# Patient Record
Sex: Female | Born: 1975
Health system: Southern US, Community
[De-identification: ages and names within clinical notes are randomized; demographics above are authoritative.]

## PROBLEM LIST (undated history)

## (undated) DIAGNOSIS — O24419 Gestational diabetes mellitus in pregnancy, unspecified control: Secondary | ICD-10-CM

## (undated) DIAGNOSIS — R0602 Shortness of breath: Secondary | ICD-10-CM

## (undated) DIAGNOSIS — Z923 Personal history of irradiation: Secondary | ICD-10-CM

## (undated) DIAGNOSIS — I10 Essential (primary) hypertension: Secondary | ICD-10-CM

## (undated) DIAGNOSIS — C7951 Secondary malignant neoplasm of bone: Secondary | ICD-10-CM

## (undated) DIAGNOSIS — K219 Gastro-esophageal reflux disease without esophagitis: Secondary | ICD-10-CM

## (undated) DIAGNOSIS — C50919 Malignant neoplasm of unspecified site of unspecified female breast: Secondary | ICD-10-CM

## (undated) DIAGNOSIS — F419 Anxiety disorder, unspecified: Secondary | ICD-10-CM

## (undated) DIAGNOSIS — C787 Secondary malignant neoplasm of liver and intrahepatic bile duct: Secondary | ICD-10-CM

## (undated) HISTORY — DX: Malignant neoplasm of unspecified site of unspecified female breast: C50.919

## (undated) HISTORY — PX: KELOID EXCISION: SHX1856

## (undated) HISTORY — DX: Anxiety disorder, unspecified: F41.9

## (undated) HISTORY — DX: Essential (primary) hypertension: I10

---

## 2003-05-14 ENCOUNTER — Emergency Department (HOSPITAL_COMMUNITY): Admission: EM | Admit: 2003-05-14 | Discharge: 2003-05-14 | Payer: Self-pay | Admitting: Emergency Medicine

## 2008-04-01 ENCOUNTER — Other Ambulatory Visit: Admission: RE | Admit: 2008-04-01 | Discharge: 2008-04-01 | Payer: Self-pay | Admitting: Obstetrics and Gynecology

## 2012-04-07 ENCOUNTER — Other Ambulatory Visit (HOSPITAL_COMMUNITY)
Admission: RE | Admit: 2012-04-07 | Discharge: 2012-04-07 | Disposition: A | Discharge status: Home / Self Care | Payer: 59 | Source: Ambulatory Visit | Attending: Obstetrics and Gynecology | Admitting: Obstetrics and Gynecology

## 2012-04-07 DIAGNOSIS — Z113 Encounter for screening for infections with a predominantly sexual mode of transmission: Secondary | ICD-10-CM | POA: Insufficient documentation

## 2012-04-07 DIAGNOSIS — Z1151 Encounter for screening for human papillomavirus (HPV): Secondary | ICD-10-CM | POA: Insufficient documentation

## 2012-04-07 DIAGNOSIS — Z01419 Encounter for gynecological examination (general) (routine) without abnormal findings: Secondary | ICD-10-CM | POA: Insufficient documentation

## 2012-08-25 ENCOUNTER — Encounter: Payer: Self-pay | Admitting: Adult Health

## 2014-01-06 ENCOUNTER — Other Ambulatory Visit: Payer: Self-pay | Admitting: Family

## 2014-01-06 DIAGNOSIS — N63 Unspecified lump in unspecified breast: Secondary | ICD-10-CM

## 2014-01-12 ENCOUNTER — Other Ambulatory Visit: Payer: Self-pay | Admitting: Family

## 2014-01-12 ENCOUNTER — Ambulatory Visit
Admission: RE | Admit: 2014-01-12 | Discharge: 2014-01-12 | Disposition: A | Discharge status: Home / Self Care | Payer: Medicaid Other | Source: Ambulatory Visit | Attending: Family | Admitting: Family

## 2014-01-12 ENCOUNTER — Ambulatory Visit
Admission: RE | Admit: 2014-01-12 | Discharge: 2014-01-12 | Disposition: A | Discharge status: Home / Self Care | Payer: BC Managed Care – PPO | Source: Ambulatory Visit | Attending: Family | Admitting: Family

## 2014-01-12 ENCOUNTER — Encounter (INDEPENDENT_AMBULATORY_CARE_PROVIDER_SITE_OTHER): Payer: Self-pay

## 2014-01-12 DIAGNOSIS — N63 Unspecified lump in unspecified breast: Secondary | ICD-10-CM

## 2014-01-13 ENCOUNTER — Other Ambulatory Visit: Payer: Self-pay | Admitting: Family

## 2014-01-13 DIAGNOSIS — C50911 Malignant neoplasm of unspecified site of right female breast: Secondary | ICD-10-CM

## 2014-01-15 ENCOUNTER — Telehealth: Payer: Self-pay | Admitting: *Deleted

## 2014-01-15 DIAGNOSIS — C50211 Malignant neoplasm of upper-inner quadrant of right female breast: Secondary | ICD-10-CM | POA: Insufficient documentation

## 2014-01-15 NOTE — Telephone Encounter (Signed)
Received call back from patient and confirmed patient appointment for [facility] 01/20/14 at 12N.

## 2014-01-15 NOTE — Telephone Encounter (Signed)
Left 2 vm with pt to schedule [facility]. Informed [facility]. Leigh informed me that pt was informed date, time and place.

## 2014-01-18 ENCOUNTER — Ambulatory Visit
Admission: RE | Admit: 2014-01-18 | Discharge: 2014-01-18 | Disposition: A | Discharge status: Home / Self Care | Payer: Medicaid Other | Source: Ambulatory Visit | Attending: Family | Admitting: Family

## 2014-01-18 ENCOUNTER — Other Ambulatory Visit: Payer: Self-pay | Admitting: Diagnostic Radiology

## 2014-01-18 DIAGNOSIS — C50911 Malignant neoplasm of unspecified site of right female breast: Secondary | ICD-10-CM

## 2014-01-18 MED ORDER — GADOBENATE DIMEGLUMINE 529 MG/ML IV SOLN
15.0000 mL | Freq: Once | INTRAVENOUS | Status: AC | PRN
  Administered 2014-01-18: 15 mL via INTRAVENOUS

## 2014-01-20 ENCOUNTER — Encounter: Payer: Self-pay | Admitting: Hematology and Oncology

## 2014-01-20 ENCOUNTER — Other Ambulatory Visit (HOSPITAL_BASED_OUTPATIENT_CLINIC_OR_DEPARTMENT_OTHER): Payer: Medicaid Other

## 2014-01-20 ENCOUNTER — Encounter: Payer: Self-pay | Admitting: Radiation Oncology

## 2014-01-20 ENCOUNTER — Ambulatory Visit: Payer: Medicaid Other

## 2014-01-20 ENCOUNTER — Encounter: Payer: Self-pay | Admitting: General Practice

## 2014-01-20 ENCOUNTER — Ambulatory Visit (INDEPENDENT_AMBULATORY_CARE_PROVIDER_SITE_OTHER): Payer: Self-pay | Admitting: General Surgery

## 2014-01-20 ENCOUNTER — Ambulatory Visit: Payer: Medicaid Other | Attending: General Surgery | Admitting: Physical Therapy

## 2014-01-20 ENCOUNTER — Ambulatory Visit (HOSPITAL_BASED_OUTPATIENT_CLINIC_OR_DEPARTMENT_OTHER): Payer: Medicaid Other | Admitting: Hematology and Oncology

## 2014-01-20 ENCOUNTER — Other Ambulatory Visit (INDEPENDENT_AMBULATORY_CARE_PROVIDER_SITE_OTHER): Payer: Self-pay | Admitting: General Surgery

## 2014-01-20 ENCOUNTER — Ambulatory Visit
Admission: RE | Admit: 2014-01-20 | Discharge: 2014-01-20 | Disposition: A | Discharge status: Home / Self Care | Payer: BC Managed Care – PPO | Source: Ambulatory Visit | Attending: Radiation Oncology | Admitting: Radiation Oncology

## 2014-01-20 VITALS — BP 151/69 | HR 61 | Temp 99.6°F | Resp 18 | Ht 67.0 in | Wt 169.2 lb

## 2014-01-20 DIAGNOSIS — C50919 Malignant neoplasm of unspecified site of unspecified female breast: Secondary | ICD-10-CM | POA: Insufficient documentation

## 2014-01-20 DIAGNOSIS — C773 Secondary and unspecified malignant neoplasm of axilla and upper limb lymph nodes: Secondary | ICD-10-CM

## 2014-01-20 DIAGNOSIS — I10 Essential (primary) hypertension: Secondary | ICD-10-CM | POA: Insufficient documentation

## 2014-01-20 DIAGNOSIS — C50211 Malignant neoplasm of upper-inner quadrant of right female breast: Secondary | ICD-10-CM

## 2014-01-20 DIAGNOSIS — IMO0001 Reserved for inherently not codable concepts without codable children: Secondary | ICD-10-CM | POA: Insufficient documentation

## 2014-01-20 DIAGNOSIS — R293 Abnormal posture: Secondary | ICD-10-CM | POA: Insufficient documentation

## 2014-01-20 DIAGNOSIS — Z170 Estrogen receptor positive status [ER+]: Secondary | ICD-10-CM

## 2014-01-20 HISTORY — DX: Malignant neoplasm of unspecified site of unspecified female breast: C50.919

## 2014-01-20 LAB — COMPREHENSIVE METABOLIC PANEL (CC13)
ALT: <6 U/L (ref 0–55)
ANION GAP: 5 meq/L (ref 3–11)
AST: 9 U/L (ref 5–34)
Albumin: 4 g/dL (ref 3.5–5.0)
Alkaline Phosphatase: 57 U/L (ref 40–150)
BILIRUBIN TOTAL: 0.43 mg/dL (ref 0.20–1.20)
BUN: 9.3 mg/dL (ref 7.0–26.0)
CO2: 26 meq/L (ref 22–29)
CREATININE: 0.8 mg/dL (ref 0.6–1.1)
Calcium: 9.8 mg/dL (ref 8.4–10.4)
Chloride: 108 mEq/L (ref 98–109)
Glucose: 101 mg/dl (ref 70–140)
Potassium: 3.8 mEq/L (ref 3.5–5.1)
Sodium: 139 mEq/L (ref 136–145)
Total Protein: 8.1 g/dL (ref 6.4–8.3)

## 2014-01-20 LAB — CBC WITH DIFFERENTIAL/PLATELET
BASO%: 0.8 % (ref 0.0–2.0)
Basophils Absolute: 0.1 10*3/uL (ref 0.0–0.1)
EOS ABS: 0.1 10*3/uL (ref 0.0–0.5)
EOS%: 1.2 % (ref 0.0–7.0)
HEMATOCRIT: 36.9 % (ref 34.8–46.6)
HGB: 11.7 g/dL (ref 11.6–15.9)
LYMPH%: 27.1 % (ref 14.0–49.7)
MCH: 27.7 pg (ref 25.1–34.0)
MCHC: 31.7 g/dL (ref 31.5–36.0)
MCV: 87.5 fL (ref 79.5–101.0)
MONO#: 0.6 10*3/uL (ref 0.1–0.9)
MONO%: 9.5 % (ref 0.0–14.0)
NEUT%: 61.4 % (ref 38.4–76.8)
NEUTROS ABS: 4.2 10*3/uL (ref 1.5–6.5)
PLATELETS: 234 10*3/uL (ref 145–400)
RBC: 4.22 10*6/uL (ref 3.70–5.45)
RDW: 12.7 % (ref 11.2–14.5)
WBC: 6.8 10*3/uL (ref 3.9–10.3)
lymph#: 1.8 10*3/uL (ref 0.9–3.3)

## 2014-01-20 NOTE — Progress Notes (Signed)
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 Breast cancer of upper-inner quadrant of right female breast 174.2 C50.211     HISTORY OF PRESENT ILLNESS::Debbie Martinez is a 38 y.o. female who presented with a  palpable right breast mass. Mammogram showed a suspicious 1m mass at 2:00 and separate area of calcifications at 12:00 (5x953m. Also a suspicious node in the axilla.  R Axillary node biopsy was positive for carcinoma, and both masses were biopsied in the breast showing grade II Invasive Ductal Carcinoma with DCIS, ER+ / PR+ / Her2 neg and a separate grade III Invasive Ductal Carcinoma with DCIS, ER+ / PR+ / Her2 neg.  MRI showed 2 breast masses corresponding with the areas on mammogram that had been biopsied (1326mt 12:00 and 49m6m the 2:00/UIQ region), and multiple suspicious axillary nodes and a 6mm 62metropectoral node. L side negative for signs of malignancy.  She has met with Dr GudenLindi Adieis interested in and planning to pursue neoadjuvant chemotherapy.  She has met with Dr Toth Marlou Starksshe is hoping to achieve breast conservation thereafter.  She denies hormonal replacement.  She has one daughter.  She lives in [place]. Win[place] is not currently working.  She has some post biopsy tenderness.  PREVIOUS RADIATION THERAPY: No  PAST MEDICAL HISTORY:  has a past medical history of Hypertension and Anxiety.    PAST SURGICAL HISTORY:History reviewed. No pertinent past surgical history.  FAMILY HISTORY: family history is not on file.  SOCIAL HISTORY:  reports that she has never smoked. She  does not have any smokeless tobacco history on file. She reports that she drinks alcohol. She reports that she does not use illicit drugs.  ALLERGIES: Lisinopril  MEDICATIONS:  Current Outpatient Prescriptions  Medication Sig Dispense Refill  . atenolol (TENORMIN) 50 MG tablet Take 50 mg by mouth daily.       No current facility-administered medications for this encounter.    REVIEW OF SYSTEMS:  Notable for that above.   PHYSICAL EXAM:    Vitals with Age-Percentiles 01/20/2014  Length 170.2395.3Systolic 151  202stolic 69  Pulse 61  Respiration 18  Weight 76.749 kg  BMI 26.6  VISIT REPORT    General: Alert and oriented, in no acute distress HEENT: Head is normocephalic. Pupils are equally round and reactive to light. Extraocular movements are intact. Oropharynx is clear. Neck: Neck is supple, no palpable cervical or supraclavicular lymphadenopathy. Heart: Regular in rate and rhythm with no murmurs, rubs, or gallops. Chest: Clear to auscultation bilaterally, with no rhonchi, wheezes, or rales. Abdomen: Soft, nontender, nondistended, with no rigidity or guarding. Extremities: No cyanosis or edema. Lymphatics: see HEENT Skin: No concerning lesions. Musculoskeletal: symmetric strength and muscle tone throughout. Neurologic: Cranial nerves II through XII are grossly intact. No obvious focalities. Speech is fluent. Coordination is intact. Psychiatric: Judgment and insight are intact. Affect is appropriate. Breasts: right breast notable for two lesions in the upper inner quadrant, ranging from 2.5 to 3.5 cm in dimension. I appreciate two nodes in the R axilla, approximately 1cm each. Left  side unremarkable.  ECOG = 0  0 - Asymptomatic (Fully active, able to carry on all predisease activities without restriction)  1 - Symptomatic but completely ambulatory (Restricted in physically strenuous activity but ambulatory and able to carry out work of a light or sedentary nature. For  example, light housework, office work)  2 - Symptomatic, <50% in bed during the day (Ambulatory and capable of all self care but unable to carry out any work activities. Up and about more than 50% of waking hours)  3 - Symptomatic, >50% in bed, but not bedbound (Capable of only limited self-care, confined to bed or chair 50% or more of waking hours)  4 - Bedbound (Completely disabled. Cannot carry on any self-care. Totally confined to bed or chair)  5 - Death   Eustace Pen MM, Creech RH, Tormey DC, et al. [id number]). "Toxicity and response criteria of the [facility]". [address] Oncol. 5 (6): 649-55   LABORATORY DATA:  Lab Results  Component Value Date   WBC 6.8 01/20/2014   CMP     Component Value Date/Time   NA 139 01/20/2014 1149         RADIOGRAPHY: Mr Breast Bilateral W Wo Contrast  01/18/2014   CLINICAL DATA:  Newly diagnosed invasive ductal carcinoma in a right upper inner quadrant breast mass, right upper breast calcifications, and metastatic right axillary lymph node.  LABS:  BUN and creatinine were obtained on site at [facility] at  [address]  Results:  BUN 7 mg/dL,  Creatinine 0.7 mg/dL.  EXAM: BILATERAL BREAST MRI WITH AND WITHOUT CONTRAST  TECHNIQUE: Multiplanar, multisequence MR images of both breasts were obtained prior to and following the intravenous administration of 34m of MultiHance.  THREE-DIMENSIONAL MR IMAGE RENDERING ON INDEPENDENT WORKSTATION:  Three-dimensional MR images were rendered by post-processing of the original MR data on an independent workstation. The three-dimensional MR images were interpreted, and findings are reported in the following complete MRI report for this study. Three dimensional images were evaluated at the independent DynaCad workstation  COMPARISON:  Previous exams  FINDINGS: Breast composition: c:  Heterogeneous fibroglandular tissue  Background parenchymal enhancement: Moderate  Right breast:  Clumped  nodular enhancement right breast 12 o'clock location with associated post biopsy change, measuring 1.3 x 0.8 x 0.5 cm, demonstrating a focus of central wash-in/washout enhancement kinetics. This corresponds to an area of biopsy proven invasive ductal carcinoma corresponding to mammographically evident calcifications.  Upper inner quadrant, 2.8 x 2.0 x 2.0 cm irregular mass with central clip artifact, wash-in/ washout enhancing kinetics, corresponding to the biopsy-proven mammographically and sonographically evident mass and biopsy-proven invasive ductal carcinoma.  Left breast: No mass or abnormal enhancement.  Lymph nodes: Multiple borderline enlarged right axillary lymph nodes with cortical thickening are noted, largest short axis diameter 1 cm. There is also a 0.6 cm short axis right retropectoral node.  Ancillary findings:  None.  IMPRESSION: Abnormal enhancement right breast upper inner quadrant and 12 o'clock location corresponding to biopsy-proven invasive ductal carcinoma in both locations, with multiple borderline enlarged abnormal appearing right axillary lymph nodes demonstrating cortical thickening, corresponding to biopsy proven metastatic right axillary lymphadenopathy.  No MRI evidence for contralateral left-sided malignancy.  RECOMMENDATION: Treatment plan  BI-RADS CATEGORY  6: Known biopsy-proven malignancy.   Electronically Signed   By: GConchita ParisM.D.   On: 01/18/2014 12:00   Mm Digital Diagnostic Bilat  01/12/2014   CLINICAL DATA:  38year old female with palpable right breast mass  discovered on self-examination.  EXAM: DIGITAL DIAGNOSTIC  BILATERAL MAMMOGRAM WITH CAD  ULTRASOUND RIGHT BREAST  COMPARISON:  None.  ACR Breast Density Category b: There are scattered areas of fibroglandular density.  FINDINGS: Spot compression and magnification views of the right breast are performed as well as routine views of both breasts.  A 1.6 x 1.9 cm irregular mass within the upper inner right breast  is identified.  A 5 x 9 mm group of heterogeneous calcifications within the upper right breast is noted.  The mass and heterogeneous calcifications are separated by a distance of 4.5 cm.  No left breast abnormalities are identified.  Mammographic images were processed with CAD.  On physical exam, a firm palpable mass identified at the 2 o'clock position of the right breast 6 cm from the nipple. Thickening in the right axilla is noted.  Ultrasound is performed, showing a 1.8 x 1.9 x 1.6 cm irregular hypoechoic mass at the 2 o'clock position of the right breast 6 cm from the nipple. Enlarged level 1 and probable level to right axillary lymph nodes are identified.  IMPRESSION: Highly suspicious 1.6 x 1.9 cm upper inner right breast mass with enlarged right axillary lymph nodes -likely representing malignancy with right axillary lymphatic spread.  Suspicious calcifications in the upper right breast.  Tissue sampling of the above findings is recommended.  RECOMMENDATION: Ultrasound-guided biopsy of right breast mass and enlarged right axillary lymph nodes. Stereotactic guided biopsy of suspicious upper right breast calcifications. These biopsies will be performed today but dictated in a separate report.  I have discussed the findings and recommendations with the patient. Results were also provided in writing at the conclusion of the visit. If applicable, a reminder letter will be sent to the patient regarding the next appointment.  BI-RADS CATEGORY  5: Highly suggestive of malignancy.   Electronically Signed   By: Hassan Rowan M.D.   On: 01/12/2014 15:44   Mm Digital Diagnostic Unilat R  01/12/2014   CLINICAL DATA:  38 year old female - evaluate clip placement following ultrasound-guided right breast biopsy.  EXAM: DIAGNOSTIC RIGHT MAMMOGRAM POST ULTRASOUND BIOPSY  COMPARISON:  Previous exams  FINDINGS: Mammographic images were obtained following ultrasound guided biopsy of the 1.8 x 1.9 cm mass at the 2 o'clock position  of the right breast.  The paper clip shaped clip is in satisfactory position.  The coil shaped clip corresponds to the calcifications recently stereotactically biopsied.  IMPRESSION: Satisfactory clip position following ultrasound-guided right breast biopsy.  Final Assessment: Post Procedure Mammograms for Marker Placement   Electronically Signed   By: Hassan Rowan M.D.   On: 01/12/2014 16:03   US Breast Ltd Uni Right Inc Axilla  01/12/2014   CLINICAL DATA:  38 year old female with palpable right breast mass discovered on self-examination.  EXAM: DIGITAL DIAGNOSTIC  BILATERAL MAMMOGRAM WITH CAD  ULTRASOUND RIGHT BREAST  COMPARISON:  None.  ACR Breast Density Category b: There are scattered areas of fibroglandular density.  FINDINGS: Spot compression and magnification views of the right breast are performed as well as routine views of both breasts.  A 1.6 x 1.9 cm irregular mass within the upper inner right breast is identified.  A 5 x 9 mm group of heterogeneous calcifications within the upper right breast is noted.  The mass and heterogeneous calcifications are separated by a distance of 4.5 cm.  No left breast abnormalities are identified.  Mammographic images were processed with CAD.  On physical exam, a firm palpable mass identified at  the 2 o'clock position of the right breast 6 cm from the nipple. Thickening in the right axilla is noted.  Ultrasound is performed, showing a 1.8 x 1.9 x 1.6 cm irregular hypoechoic mass at the 2 o'clock position of the right breast 6 cm from the nipple. Enlarged level 1 and probable level to right axillary lymph nodes are identified.  IMPRESSION: Highly suspicious 1.6 x 1.9 cm upper inner right breast mass with enlarged right axillary lymph nodes -likely representing malignancy with right axillary lymphatic spread.  Suspicious calcifications in the upper right breast.  Tissue sampling of the above findings is recommended.  RECOMMENDATION: Ultrasound-guided biopsy of right breast  mass and enlarged right axillary lymph nodes. Stereotactic guided biopsy of suspicious upper right breast calcifications. These biopsies will be performed today but dictated in a separate report.  I have discussed the findings and recommendations with the patient. Results were also provided in writing at the conclusion of the visit. If applicable, a reminder letter will be sent to the patient regarding the next appointment.  BI-RADS CATEGORY  5: Highly suggestive of malignancy.   Electronically Signed   By: Hassan Rowan M.D.   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 This was found to be concordant by Dr. Hassan Rowan. Pathology was discussed with the patient by telephone. She reported doing well after the biopsy. Post biopsy instructions were reviewed and her questions were answered. The patient has been scheduled at [facility] on January 20, 2014. A bilateral breast MRI has been scheduled for January 18, 2014. She has already been given Scientist, clinical (histocompatibility and immunogenetics). My number was provided for future questions and concerns.  Pathology results reported by Susa Raring RN, BSN on January 14, 2014.   Electronically Signed   By: Hassan Rowan M.D.   On: 01/14/2014 08:47   01/14/2014   CLINICAL DATA:  38 year old female for tissue sampling of suspicious calcifications within the upper right breast.  EXAM: RIGHT BREAST STEREOTACTIC CORE NEEDLE BIOPSY  COMPARISON:  Previous exams.  FINDINGS: The patient and I discussed the procedure of stereotactic-guided biopsy including benefits and alternatives. We discussed the high likelihood of a successful procedure. We discussed the risks of the procedure including infection, bleeding, tissue injury, clip  migration, and inadequate sampling. Informed written consent was given. The usual time out protocol was performed immediately prior to the procedure.  Using sterile technique and 2% Lidocaine as local anesthetic, under stereotactic guidance, a 12 gauge vacuum assisted needle device was used to perform core needle biopsy of calcifications within the upper right breast using a superior approach. Specimen radiograph was performed showing calcification. Specimens with calcifications are identified for pathology.  At the conclusion of the procedure, a coil shaped tissue marker clip was deployed into the biopsy cavity. Follow-up 2-view mammogram confirmed clip placement to be satisfactory.  IMPRESSION: Stereotactic-guided biopsy of right breast calcifications. No apparent complications.  Pathology will be followed.  Electronically Signed: By: Hassan Rowan M.D. On: 01/12/2014 15:49   [place] Rt Breast Bx W Loc Dev 1st Lesion Img Bx Spec US Guide  01/14/2014   ADDENDUM REPORT: 01/14/2014 08:49  ADDENDUM: Pathology revealed grade III invasive ductal carcinoma and ductal carcinoma in situ in the right breast.  This was found to be concordant by Dr. Hassan Rowan. Pathology was discussed with the patient by telephone. She reported doing well after the biopsy. Post biopsy instructions were reviewed and her questions were answered. The patient has been scheduled at [facility] on January 20, 2014. A bilateral breast MRI has been scheduled for January 18, 2014. She has already been given Scientist, clinical (histocompatibility and immunogenetics). My number was provided for future questions and concerns.  Pathology results reported by Susa Raring RN, BSN on January 14, 2014.   Electronically Signed   By: Hassan Rowan M.D.   On: 01/14/2014 08:49   01/14/2014   CLINICAL DATA:  38 year old female for tissue sampling of 1.8 x 1.9 cm mass in the upper inner right breast.  EXAM: ULTRASOUND GUIDED RIGHT BREAST CORE NEEDLE BIOPSY  COMPARISON:   Previous exams.  FINDINGS: I met with the patient and we discussed the procedure of ultrasound-guided biopsy, including benefits and alternatives. We discussed the high likelihood of a successful procedure. We discussed the risks of the procedure, including infection, bleeding, tissue injury, clip migration, and inadequate sampling. Informed written consent was given. The usual time-out protocol was performed immediately prior to the procedure.  Using sterile technique and 2% Lidocaine as local anesthetic, under direct ultrasound visualization, a 14 gauge spring-loaded device was used to perform biopsy of the 1.8 x 1.9 cm upper inner right breast mass using a lateral approach. At the conclusion of the procedure a paper clip shaped tissue marker clip was deployed into the biopsy cavity. Follow up 2 view mammogram was performed and dictated separately.  IMPRESSION: Ultrasound guided biopsy of upper inner right breast mass. No apparent complications.  Pathology will be followed.  Electronically Signed: By: Hassan Rowan M.D. On: 01/12/2014 16:00   [place] Rt Breast Bx W Loc Dev Ea Add Lesion Img Bx Spec US Guide  01/14/2014   ADDENDUM REPORT: 01/14/2014 08:50  ADDENDUM: Pathology revealed metastatic carcinoma in the right axillary lymph node. This was found to be concordant by Dr. Hassan Rowan. Pathology was discussed with the patient by telephone. She reported doing well after the biopsy. Post biopsy instructions were reviewed and her questions were answered. The patient has been scheduled at [facility] on January 20, 2014. A bilateral breast MRI has been scheduled for January 18, 2014. She has already been given Scientist, clinical (histocompatibility and immunogenetics). My number was provided for future questions and concerns.  Pathology results reported by Susa Raring RN, BSN on January 14, 2014.   Electronically Signed   By: Hassan Rowan M.D.   On: 01/14/2014 08:50   01/14/2014   CLINICAL DATA:  38 year old female with  suspicious right breast mass and right breast calcifications. For tissue sampling of enlarged level 1 right axillary lymph node.  EXAM: ULTRASOUND GUIDED CORE NEEDLE BIOPSY OF A RIGHT AXILLARY NODE  COMPARISON:  Previous exams.  FINDINGS: I met with the patient and we discussed the procedure of ultrasound-guided biopsy, including benefits and alternatives. We discussed the high likelihood of a successful procedure. We discussed the risks of the procedure, including infection, bleeding, tissue injury, clip migration, and inadequate sampling. Informed written consent was given. The usual time-out protocol was performed immediately prior to the procedure.  Using sterile technique and 2% Lidocaine as local anesthetic, under direct ultrasound visualization, a 16 gauge spring-loaded device was used to perform biopsy of an enlarged level 1 right axillary lymph node using a lateral approach.  IMPRESSION: Ultrasound guided  biopsy of enlarged level 1 right axillary lymph node. No apparent complications.  Pathology will be followed.  Electronically Signed: By: Hassan Rowan M.D. On: 01/12/2014 16:02      IMPRESSION/PLAN: Lovely 38 yo woman with stage IIB right breast cancer. Multidisciplinary plan, as discussed in clinic today:  1) refer to genetics  2) Neoadjuvant chemotherapy  3) possible Alliance trial - to be discussed further with the patient at a later time. She may be interested.  4) breast conserving surgery, if possible, vs Mastectomy  5) Adjuvant radiotherapy  6) Anti-estrogen therapy  It was a pleasure meeting the patient today. I discussed the risks, benefits, and side effects of adjuvant radiotherapy with her. This will be indicated whether she undergoes breast conservation or mastectomy. We discussed that radiation would take approximately 6 weeks to complete and that I would give the patient a few weeks to heal following surgery before starting treatment planning. We spoke about acute effects  including skin irritation and fatigue as well as much less common late effects including lung irritation. We spoke about the latest technology that is used to minimize the risk of late effects for breast cancer patients undergoing radiotherapy. No guarantees of treatment were given. The patient is enthusiastic about proceeding with treatment. I look forward to participating in the patient's care.  __________________________________________   [name], MD

## 2014-01-20 NOTE — Progress Notes (Signed)
Page NOTE  Patient Care Team: Eloise Levels, NP as PCP - General (Nurse Practitioner) Autumn Messing III, MD as Consulting Physician (General Surgery) Rulon Eisenmenger, MD as Consulting Physician (Hematology and Oncology) [name], MD as Attending Physician (Radiation Oncology)  CHIEF COMPLAINTS/PURPOSE OF CONSULTATION:  Newly diagnosed breast cancer  HISTORY OF PRESENTING ILLNESS:  Debbie Martinez 38 y.o. female is here because of recent diagnosis of right-sided breast cancer. Patient is felt a lump in the right breast associated skin dimpling changes and went to undergo a mammogram on 01/12/2014 along with an ultrasound which revealed a right upper-inner quadrant mass it was 0.5 x 0.9 cm at 12:00 position and 1.6 x 1.9 cm mass at 2:00 position this was biopsied in addition to right axillary lymph node. 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 The right 2:00 mass was 1.3 x 0.8 x 0.5 cm was also invasive ductal carcinoma with DCIS with lymphovascular invasion grade 2 ER 100% PR doesn't get some 85% and HER-2 was negative. Right axillary lymph node was measuring 1 cm there was additional enlarged lymph nodes as well in addition there was also right retropectoral lymph node measuring 0.6 cm. The right axillary lymph node biopsy came back as metastatic cancer ER positive HER-2 negative with a Ki-67 of 80% it was grade 2. By clinical staging she had a T2 N1 stage IIB right-sided breast cancer.  She was presented this morning in the multidisciplinary breast tumor board and she is here today in the breast clinic to talk about treatment options. She is accompanied by her significant other   I reviewed her records extensively and collaborated the history with the patient.  SUMMARY OF ONCOLOGIC HISTORY:   Breast cancer of upper-inner  quadrant of right female breast   01/12/2014 Mammogram Right breast 2:00 position 1.6 x 1.9 cm and the 12:00 position 0.5 x 0.9 cm distance between the 2 was 4.5 cm, right axillary lymph node enlargement   01/12/2014 Initial Diagnosis All 3 biopsies were IDC with DCIS ER percent PR 7200% Ki-67 85% HER-2 negative: Right axillary lymph node also positive for metastatic cancer ER positive HER-2 negative Ki-67 80% grade 2   01/18/2014 Breast MRI Right breast 12:00 position 2.8 x 2 x 2.2 cm: 2:00 position 1.3 x 0.8 x 0.5 cm, right axilla multiple enlarged lymph nodes 1 cm in size, right retropectoral lymph node 0.6 cm    In terms of breast cancer risk profile:  She menarched at early age of 29   She had one pregnancy, her first child was born at age 6  She had received birth control pills for approximately 5 years.  She was never exposed to fertility medications or hormone replacement therapy.  She has no family history of Breast/GYN/GI cancer Her grandmother may have had a benign breast tumor and to tamoxifen for a short time  MEDICAL HISTORY:  Past Medical History  Diagnosis Date  . Hypertension   . Anxiety     SURGICAL HISTORY: History reviewed. No pertinent past surgical history.  SOCIAL HISTORY: History   Social History  . Marital Status: Single    Spouse Name: N/A    Number of Children: N/A  . Years of Education: N/A   Occupational History  . Not on file.   Social History Main  Topics  . Smoking status: Never Smoker   . Smokeless tobacco: Not on file  . Alcohol Use: Yes     Comment: Rarely  . Drug Use: No  . Sexual Activity: Not on file   Other Topics Concern  . Not on file   Social History Narrative  . No narrative on file    FAMILY HISTORY: History reviewed. No pertinent family history.  ALLERGIES:  is allergic to lisinopril.  MEDICATIONS:  Current Outpatient Prescriptions  Medication Sig Dispense Refill  . atenolol (TENORMIN) 50 MG tablet Take 50 mg by mouth  daily.       No current facility-administered medications for this visit.    REVIEW OF SYSTEMS:   Constitutional: Denies fevers, chills or abnormal night sweats Eyes: Denies blurriness of vision, double vision or watery eyes Ears, nose, mouth, throat, and face: Denies mucositis or sore throat Respiratory: Denies cough, dyspnea or wheezes Cardiovascular: Denies palpitation, chest discomfort or lower extremity swelling Gastrointestinal:  Denies nausea, heartburn or change in bowel habits Skin: Denies abnormal skin rashes Lymphatics: Denies new lymphadenopathy or easy bruising Neurological:Denies numbness, tingling or new weaknesses Behavioral/Psych: Mood is stable, no new changes  Breast: Palpable lump in the right breast All other systems were reviewed with the patient and are negative.  PHYSICAL EXAMINATION: ECOG PERFORMANCE STATUS: 0 - Asymptomatic  Filed Vitals:   01/20/14 1212  BP: 151/69  Pulse: 61  Temp: 99.6 F (37.6 C)  Resp: 18   Filed Weights   01/20/14 1212  Weight: 169 lb 3.2 oz (76.749 kg)    GENERAL:alert, no distress and comfortable SKIN: skin color, texture, turgor are normal, no rashes or significant lesions EYES: normal, conjunctiva are pink and non-injected, sclera clear OROPHARYNX:no exudate, no erythema and lips, buccal mucosa, and tongue normal  NECK: supple, thyroid normal size, non-tender, without nodularity LYMPH:  no palpable lymphadenopathy in the cervical, axillary or inguinal LUNGS: clear to auscultation and percussion with normal breathing effort HEART: regular rate & rhythm and no murmurs and no lower extremity edema ABDOMEN:abdomen soft, non-tender and normal bowel sounds Musculoskeletal:no cyanosis of digits and no clubbing  PSYCH: alert & oriented x 3 with fluent speech NEURO: no focal motor/sensory deficits BREAST: Bilobed mass in the right breast both palpable about 1-2 inches together  LABORATORY DATA:  I have reviewed the data as  listed Lab Results  Component Value Date   WBC 6.8 01/20/2014   HGB 11.7 01/20/2014   HCT 36.9 01/20/2014   MCV 87.5 01/20/2014   PLT 234 01/20/2014   Lab Results  Component Value Date   NA 139 01/20/2014   K 3.8 01/20/2014   CO2 26 01/20/2014    RADIOGRAPHIC STUDIES: I have personally reviewed the radiological reports and agreed with the findings in the report. 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 After reviewing the pathology in detail, we proceeded to discuss the different treatment options between surgery, radiation, chemotherapy, antiestrogen therapies. I reviewed the MRI images in the 2 lesions in the breasts in addition to the axilla and the subpectoral lymph node.  Based  on the multidisciplinary breast tumor board, our recommendation is to treat her with neoadjuvant chemotherapy followed by surgery followed by radiation to be followed by antiestrogen therapy. After neoadjuvant chemotherapy she could be a candidate for clinical trial ALLIANCE [id number], which randomizes patients with node positive disease while undergoing sentinel lymph node study to be randomized between axillary lymph node dissection versus nodal radiation if they have sentinel lymph node positive disease.  Chemotherapy counseling: I have discussed the risks and benefits of chemotherapy including the risks of nausea/ vomiting, risk of infection from low WBC count, fatigue due to chemo or anemia, bruising or bleeding due to low platelets, mouth sores, loss/  change in taste and decreased appetite. Liver and kidney function will be monitored through out chemotherapy as abnormalities in liver and kidney function may be a side effect of treatment. Cardiac dysfunction due to Adriamycin was discussed in detail. Risk of permanent bone marrow dysfunction and leukemia due to chemo were also discussed. Chemotherapy counseling and education in print format were provided and all the questions were answered. Patient will be set up for chemotherapy education class prior to starting chemotherapy.  I also discussed with her about participation in PREVENT trial which randomizes patients getting anthracyclines to get atorvastatin versus placebo for 24 months. I provided her with literature and contact information so that she can be enrolled on the trial which also provides 2 free cardiac MRIs as part of the trial. We will request echocardiogram, port placement, chemotherapy education class.    All questions were answered. The patient knows to call the clinic with any problems, questions or concerns. I spent 55 minutes counseling the patient face to face. The total time spent in the appointment was 60 minutes and more than 50% was on counseling.     Rulon Eisenmenger, MD 01/20/2014 3:18 PM

## 2014-01-20 NOTE — Progress Notes (Signed)
Checked in new pt with no financial concerns at this time. I informed pt of the [employer] and gave her a flyer on what they assist with as well as Raquel's card in she would like to apply for the grant.  I informed her of the different foundations that offer copay assistance if needed.

## 2014-01-20 NOTE — Assessment & Plan Note (Signed)
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 After reviewing the pathology in detail, we proceeded to discuss the different treatment options between surgery, radiation, chemotherapy, antiestrogen therapies. I reviewed the MRI images in the 2 lesions in the breasts in addition to the axilla and the subpectoral lymph node.  Based on the multidisciplinary breast tumor board, our recommendation is to treat her with neoadjuvant chemotherapy followed by surgery followed by radiation to be followed by antiestrogen therapy. After neoadjuvant chemotherapy she could be a candidate for clinical trial ALLIANCE [id number], which randomizes patients with node positive disease while undergoing sentinel lymph node study to be randomized between axillary lymph node dissection versus nodal radiation if they have sentinel lymph node positive disease.  Chemotherapy counseling: I have discussed the risks and benefits of chemotherapy including the risks of nausea/ vomiting, risk of infection from low WBC count, fatigue due to chemo or anemia, bruising or bleeding due to low platelets, mouth sores, loss/ change in taste and decreased appetite. Liver and kidney function will be monitored through out chemotherapy as abnormalities in liver and kidney function may be a side effect of treatment. Cardiac dysfunction due to Adriamycin was discussed in detail. Risk of permanent bone marrow dysfunction and leukemia due to chemo were also discussed. Chemotherapy counseling and education in print format were provided and all the questions were answered. Patient will be set up for chemotherapy education class prior to  starting chemotherapy.  I also discussed with her about participation in PREVENT trial which randomizes patients getting anthracyclines to get atorvastatin versus placebo for 24 months. I provided her with literature and contact information so that she can be enrolled on the trial which also provides 2 free cardiac MRIs as part of the trial. We will request echocardiogram, port placement, chemotherapy education class.

## 2014-01-20 NOTE — Progress Notes (Signed)
[facility] Psychosocial Distress Screening Spiritual Care  Accompanied by Counseling Intern Elson Clan, met Teia and SO Derrick at breast clinic to introduce [place], Counseling, and [place] team/resources, and to review distress screening protocol.  The patient scored a 10 on the Psychosocial Distress Thermometer which indicates severe distress. Chaplain and Counseling Intern assessed for distress and other psychosocial needs.   ONCBCN DISTRESS SCREENING 01/20/2014  Screening Type Initial Screening  Elta [place] the number that describes how much distress you have been experiencing in the past week 10  Practical problem type Housing;Work/school  Emotional problem type Nervousness/Anxiety;Adjusting to illness  Information Concerns Type Lack of info about diagnosis  Physical Problem type Sleep/insomnia  Referral to clinical social work Yes  Referral to support programs Yes  Other Blanca, Counseling Intern   Per pt, she has had trouble sleeping and felt stress (which she rates as 10^5) due to diagnosis, fear about prognosis, and anxiety about talking to her 74yo daughter about her illness.  Counseling Intern offered 1:1 and family support, such as help with tools to cope with anxiety and space to process and prepare what pt might say to daughter.  Provided pastoral presence, reflective listening, validation of feelings, encouragement, and affirmation.  Pt and partner appreciative of visit and support.  Follow up needed: Yes.    If yes, follow up plan: Counseling Intern plans to follow up by phone to offer further support.   Please also page Spiritual Care as needs arise:  [phone].  Thank you.  [place], [place]

## 2014-01-20 NOTE — Progress Notes (Signed)
[facility] dtd 01/20/14 received.  Copy to Dr Lindi Adie.  Original to scan.

## 2014-01-21 NOTE — Progress Notes (Signed)
MD created note - copy to patient.  Original to scan.

## 2014-01-22 ENCOUNTER — Other Ambulatory Visit: Payer: BC Managed Care – PPO

## 2014-01-22 ENCOUNTER — Encounter (HOSPITAL_COMMUNITY): Payer: Self-pay | Admitting: Pharmacy Technician

## 2014-01-25 ENCOUNTER — Other Ambulatory Visit: Payer: Medicaid Other

## 2014-01-25 ENCOUNTER — Ambulatory Visit (HOSPITAL_BASED_OUTPATIENT_CLINIC_OR_DEPARTMENT_OTHER): Payer: BC Managed Care – PPO | Admitting: Genetic Counselor

## 2014-01-25 ENCOUNTER — Encounter (HOSPITAL_COMMUNITY): Payer: Self-pay | Admitting: *Deleted

## 2014-01-25 ENCOUNTER — Encounter: Payer: Self-pay | Admitting: Genetic Counselor

## 2014-01-25 DIAGNOSIS — Z315 Encounter for genetic counseling: Secondary | ICD-10-CM

## 2014-01-25 DIAGNOSIS — Z853 Personal history of malignant neoplasm of breast: Secondary | ICD-10-CM

## 2014-01-25 DIAGNOSIS — Z8042 Family history of malignant neoplasm of prostate: Secondary | ICD-10-CM

## 2014-01-25 DIAGNOSIS — Z806 Family history of leukemia: Secondary | ICD-10-CM

## 2014-01-25 DIAGNOSIS — C50211 Malignant neoplasm of upper-inner quadrant of right female breast: Secondary | ICD-10-CM

## 2014-01-25 MED ORDER — CEFAZOLIN SODIUM-DEXTROSE 2-3 GM-% IV SOLR
2.0000 g | INTRAVENOUS | Status: AC
  Administered 2014-01-26: 2 g via INTRAVENOUS

## 2014-01-25 NOTE — Progress Notes (Signed)
Dr.  [name] requested a consultation for genetic counseling and risk assessment for Debbie Martinez, a 38 y.o. female, for discussion of her personal history of breast cancer and family history of prostate cancer and leukemia.  She presents to clinic today to discuss the possibility of a genetic predisposition to cancer, and to further clarify her risks, as well as her family members' risks for cancer.   HISTORY OF PRESENT ILLNESS: In September 2015, at the age of 77, Debbie Martinez was diagnosed with triple positive breast cacner. This will be treated with chemotherapy, surgery and possibly radiation.      Past Medical History  Diagnosis Date  . Hypertension   . Anxiety   . Breast cancer 01/20/14    triple positive    History reviewed. No pertinent past surgical history.  History   Social History  . Marital Status: Single    Spouse Name: N/A    Number of Children: N/A  . Years of Education: N/A   Social History Main Topics  . Smoking status: Never Smoker   . Smokeless tobacco: None  . Alcohol Use: Yes     Comment: Rarely  . Drug Use: No  . Sexual Activity: None   Other Topics Concern  . None   Social History Narrative  . None    REPRODUCTIVE HISTORY AND PERSONAL RISK ASSESSMENT FACTORS: Menarche was at age 58.   premenopausal Uterus Intact: yes Ovaries Intact: yes G1P1A0, first live birth at age 65  She has not previously undergone treatment for infertility.   Oral Contraceptive use: 5 years   She has not used HRT in the past.    FAMILY HISTORY:  We obtained a detailed, 4-generation family history.  Significant diagnoses are listed below: Family History  Problem Relation Age of Onset  . Heart attack Father 68  . Lupus Maternal Aunt   . Prostate cancer Maternal Grandfather 82  . Dementia Paternal Grandmother   . Leukemia Cousin 18    paternal first cousin   The patient is an only child to her parents.  Her maternal grandmother had some  type of biopsy and was put on tamoxifen, but the patient reports that she has never stated that she had cancer.  Patient's maternal ancestors are of [place], Caucasian and African American descent, and paternal ancestors are of African American descent. There is no reported Ashkenazi Jewish ancestry. There is no known consanguinity.  GENETIC COUNSELING ASSESSMENT: Debbie Martinez is a 38 y.o. female with a personal history of triple positive breast cancer and family history of prostate cancer and leukemia which somewhat suggestive of a hereditary cancer syndrome and predisposition to cancer. We, therefore, discussed and recommended the following at today's visit.   DISCUSSION: We reviewed the characteristics, features and inheritance patterns of hereditary cancer syndromes. We also discussed genetic testing, including the appropriate family members to test, the process of testing, insurance coverage and turn-around-time for results.   PLAN: After considering the risks, benefits, and limitations, Debbie Martinez provided informed consent to pursue genetic testing and the blood sample will be sent to [facility] for analysis of the Breast/Ovarian Cancer Panel. We discussed the implications of a positive, negative and/ or variant of uncertain significance genetic test result. Results should be available within approximately 3-4 weeks' time, at which point they will be disclosed by telephone to [facility], as will any additional recommendations warranted by these results. Debbie Martinez will receive a summary  of her genetic counseling visit and a copy of her results once available. This information will also be available in Epic. We encouraged Debbie Martinez to remain in contact with cancer genetics annually so that we can continuously update the family history and inform her of any changes in cancer genetics and testing that may be of benefit for her family. Debbie Martinez  Martinez's questions were answered to her satisfaction today. Our contact information was provided should additional questions or concerns arise.  The patient was seen for a total of 45 minutes, greater than 50% of which was spent face-to-face counseling.  This note will also be sent to the referring provider via the electronic medical record. The patient will be supplied with a summary of this genetic counseling discussion as well as educational information on the discussed hereditary cancer syndromes following the conclusion of their visit.   _______________________________________________________________________ For Office Staff:  Number of people involved in session: 1 Was an Intern/ student involved with case: no

## 2014-01-26 ENCOUNTER — Telehealth: Payer: Self-pay | Admitting: *Deleted

## 2014-01-26 ENCOUNTER — Ambulatory Visit (HOSPITAL_COMMUNITY): Payer: BC Managed Care – PPO

## 2014-01-26 ENCOUNTER — Encounter (HOSPITAL_COMMUNITY): Payer: BC Managed Care – PPO | Admitting: Anesthesiology

## 2014-01-26 ENCOUNTER — Encounter (HOSPITAL_COMMUNITY): Payer: Self-pay | Admitting: *Deleted

## 2014-01-26 ENCOUNTER — Other Ambulatory Visit: Payer: BC Managed Care – PPO

## 2014-01-26 ENCOUNTER — Ambulatory Visit (HOSPITAL_COMMUNITY)
Admission: RE | Admit: 2014-01-26 | Discharge: 2014-01-26 | Disposition: A | Discharge status: Home / Self Care | Payer: BC Managed Care – PPO | Source: Ambulatory Visit | Attending: General Surgery | Admitting: General Surgery

## 2014-01-26 ENCOUNTER — Encounter: Payer: Self-pay | Admitting: Hematology and Oncology

## 2014-01-26 ENCOUNTER — Ambulatory Visit (HOSPITAL_COMMUNITY): Payer: BC Managed Care – PPO | Admitting: Anesthesiology

## 2014-01-26 ENCOUNTER — Encounter (HOSPITAL_COMMUNITY)
Admission: RE | Disposition: A | Discharge status: Home / Self Care | Payer: Self-pay | Source: Ambulatory Visit | Attending: General Surgery

## 2014-01-26 DIAGNOSIS — Z803 Family history of malignant neoplasm of breast: Secondary | ICD-10-CM | POA: Insufficient documentation

## 2014-01-26 DIAGNOSIS — C50911 Malignant neoplasm of unspecified site of right female breast: Secondary | ICD-10-CM

## 2014-01-26 DIAGNOSIS — I10 Essential (primary) hypertension: Secondary | ICD-10-CM | POA: Insufficient documentation

## 2014-01-26 HISTORY — PX: PORTACATH PLACEMENT: SHX2246

## 2014-01-26 HISTORY — DX: Gestational diabetes mellitus in pregnancy, unspecified control: O24.419

## 2014-01-26 HISTORY — DX: Gastro-esophageal reflux disease without esophagitis: K21.9

## 2014-01-26 HISTORY — DX: Shortness of breath: R06.02

## 2014-01-26 LAB — HCG, SERUM, QUALITATIVE: PREG SERUM: NEGATIVE

## 2014-01-26 SURGERY — INSERTION, TUNNELED CENTRAL VENOUS DEVICE, WITH PORT
Anesthesia: General | Site: Chest | Laterality: Left

## 2014-01-26 MED ORDER — FENTANYL CITRATE 0.05 MG/ML IJ SOLN: 25.0000 ug | INTRAMUSCULAR | Status: DC | PRN

## 2014-01-26 MED ORDER — HEPARIN SOD (PORK) LOCK FLUSH 100 UNIT/ML IV SOLN
INTRAVENOUS | Status: DC | PRN
  Administered 2014-01-26: 500 [IU] via INTRAVENOUS

## 2014-01-26 MED ORDER — PROPOFOL 10 MG/ML IV BOLUS
INTRAVENOUS | Status: DC | PRN
  Administered 2014-01-26: 160 mg via INTRAVENOUS

## 2014-01-26 MED ORDER — BUPIVACAINE HCL (PF) 0.25 % IJ SOLN
INTRAMUSCULAR | Status: DC | PRN
  Administered 2014-01-26: 8 mL

## 2014-01-26 MED ORDER — OXYCODONE HCL 5 MG PO TABS: 5.0000 mg | ORAL_TABLET | Freq: Once | ORAL | Status: DC | PRN

## 2014-01-26 MED ORDER — OXYCODONE HCL 5 MG/5ML PO SOLN: 5.0000 mg | Freq: Once | ORAL | Status: DC | PRN

## 2014-01-26 MED ORDER — SODIUM CHLORIDE 0.9 % IR SOLN
Status: DC | PRN
  Administered 2014-01-26: 10:00:00

## 2014-01-26 MED ORDER — PROPOFOL 10 MG/ML IV BOLUS
INTRAVENOUS | Status: AC
Start: 2014-01-26 — End: 2014-01-26
  Filled 2014-01-26: qty 20

## 2014-01-26 MED ORDER — CHLORHEXIDINE GLUCONATE 4 % EX LIQD
1.0000 "application " | Freq: Once | CUTANEOUS | Status: DC
  Filled 2014-01-26: qty 15

## 2014-01-26 MED ORDER — 0.9 % SODIUM CHLORIDE (POUR BTL) OPTIME
TOPICAL | Status: DC | PRN
  Administered 2014-01-26: 1000 mL

## 2014-01-26 MED ORDER — ONDANSETRON HCL 4 MG/2ML IJ SOLN
INTRAMUSCULAR | Status: DC | PRN
  Administered 2014-01-26: 4 mg via INTRAVENOUS

## 2014-01-26 MED ORDER — OXYCODONE-ACETAMINOPHEN 5-325 MG PO TABS: 1.0000 | ORAL_TABLET | ORAL | Status: DC | PRN

## 2014-01-26 MED ORDER — CEFAZOLIN SODIUM-DEXTROSE 2-3 GM-% IV SOLR
INTRAVENOUS | Status: AC
  Filled 2014-01-26: qty 50

## 2014-01-26 MED ORDER — FENTANYL CITRATE 0.05 MG/ML IJ SOLN
INTRAMUSCULAR | Status: AC
  Filled 2014-01-26: qty 5

## 2014-01-26 MED ORDER — LIDOCAINE HCL (CARDIAC) 20 MG/ML IV SOLN
INTRAVENOUS | Status: AC
  Filled 2014-01-26: qty 5

## 2014-01-26 MED ORDER — LACTATED RINGERS IV SOLN
INTRAVENOUS | Status: DC
  Administered 2014-01-26: 08:00:00 via INTRAVENOUS

## 2014-01-26 MED ORDER — DEXAMETHASONE SODIUM PHOSPHATE 4 MG/ML IJ SOLN
INTRAMUSCULAR | Status: AC
  Filled 2014-01-26: qty 1

## 2014-01-26 MED ORDER — HEPARIN SOD (PORK) LOCK FLUSH 100 UNIT/ML IV SOLN
INTRAVENOUS | Status: AC
  Filled 2014-01-26: qty 5

## 2014-01-26 MED ORDER — FENTANYL CITRATE 0.05 MG/ML IJ SOLN
INTRAMUSCULAR | Status: DC | PRN
  Administered 2014-01-26: 25 ug via INTRAVENOUS

## 2014-01-26 MED ORDER — CHLORHEXIDINE GLUCONATE 4 % EX LIQD
1.0000 | Freq: Once | CUTANEOUS | Status: DC
Start: 2014-01-26 — End: 2014-01-26
  Filled 2014-01-26: qty 15

## 2014-01-26 MED ORDER — MIDAZOLAM HCL 5 MG/5ML IJ SOLN
INTRAMUSCULAR | Status: DC | PRN
  Administered 2014-01-26: 2 mg via INTRAVENOUS

## 2014-01-26 MED ORDER — SUCCINYLCHOLINE CHLORIDE 20 MG/ML IJ SOLN
INTRAMUSCULAR | Status: AC
  Filled 2014-01-26: qty 1

## 2014-01-26 MED ORDER — LIDOCAINE HCL (CARDIAC) 20 MG/ML IV SOLN
INTRAVENOUS | Status: DC | PRN
Start: 2014-01-26 — End: 2014-01-26
  Administered 2014-01-26: 70 mg via INTRAVENOUS

## 2014-01-26 MED ORDER — DEXAMETHASONE SODIUM PHOSPHATE 4 MG/ML IJ SOLN
INTRAMUSCULAR | Status: DC | PRN
  Administered 2014-01-26: 4 mg via INTRAVENOUS

## 2014-01-26 MED ORDER — EPHEDRINE SULFATE 50 MG/ML IJ SOLN
INTRAMUSCULAR | Status: DC | PRN
  Administered 2014-01-26: 5 mg via INTRAVENOUS
  Administered 2014-01-26: 10 mg via INTRAVENOUS

## 2014-01-26 MED ORDER — SODIUM CHLORIDE 0.9 % IJ SOLN
INTRAMUSCULAR | Status: AC
  Filled 2014-01-26: qty 10

## 2014-01-26 MED ORDER — MIDAZOLAM HCL 2 MG/2ML IJ SOLN
INTRAMUSCULAR | Status: AC
  Filled 2014-01-26: qty 2

## 2014-01-26 MED ORDER — BUPIVACAINE HCL (PF) 0.25 % IJ SOLN
INTRAMUSCULAR | Status: AC
  Filled 2014-01-26: qty 30

## 2014-01-26 MED ORDER — EPHEDRINE SULFATE 50 MG/ML IJ SOLN
INTRAMUSCULAR | Status: AC
  Filled 2014-01-26: qty 1

## 2014-01-26 MED ORDER — ONDANSETRON HCL 4 MG/2ML IJ SOLN
INTRAMUSCULAR | Status: AC
  Filled 2014-01-26: qty 2

## 2014-01-26 SURGICAL SUPPLY — 52 items
BAG DECANTER FOR FLEXI CONT (MISCELLANEOUS) ×3 IMPLANT
BLADE SURG 11 STRL SS (BLADE) ×3 IMPLANT
BLADE SURG 15 STRL LF DISP TIS (BLADE) ×1 IMPLANT
BLADE SURG 15 STRL SS (BLADE) ×2
CHLORAPREP W/TINT 10.5 ML (MISCELLANEOUS) ×3 IMPLANT
COVER SURGICAL LIGHT HANDLE (MISCELLANEOUS) ×3 IMPLANT
CRADLE DONUT ADULT HEAD (MISCELLANEOUS) ×3 IMPLANT
DERMABOND ADHESIVE PROPEN (GAUZE/BANDAGES/DRESSINGS) ×2
DERMABOND ADVANCED (GAUZE/BANDAGES/DRESSINGS) ×2
DERMABOND ADVANCED .7 DNX12 (GAUZE/BANDAGES/DRESSINGS) ×1 IMPLANT
DERMABOND ADVANCED .7 DNX6 (GAUZE/BANDAGES/DRESSINGS) ×1 IMPLANT
DRAPE C-ARM 42X72 X-RAY (DRAPES) ×3 IMPLANT
DRAPE CHEST BREAST 15X10 FENES (DRAPES) ×3 IMPLANT
DRAPE UTILITY 15X26 W/TAPE STR (DRAPE) ×6 IMPLANT
ELECT CAUTERY BLADE 6.4 (BLADE) ×3 IMPLANT
ELECT REM PT RETURN 9FT ADLT (ELECTROSURGICAL) ×3
ELECTRODE REM PT RTRN 9FT ADLT (ELECTROSURGICAL) ×1 IMPLANT
GAUZE SPONGE 4X4 16PLY XRAY LF (GAUZE/BANDAGES/DRESSINGS) ×3 IMPLANT
GLOVE BIO SURGEON STRL SZ7 (GLOVE) ×3 IMPLANT
GLOVE BIO SURGEON STRL SZ7.5 (GLOVE) ×6 IMPLANT
GLOVE BIOGEL PI IND STRL 7.0 (GLOVE) ×1 IMPLANT
GLOVE BIOGEL PI INDICATOR 7.0 (GLOVE) ×2
GOWN STRL REUS W/ TWL LRG LVL3 (GOWN DISPOSABLE) ×2 IMPLANT
GOWN STRL REUS W/TWL LRG LVL3 (GOWN DISPOSABLE) ×4
INTRODUCER COOK 11FR (CATHETERS) IMPLANT
KIT BASIN OR (CUSTOM PROCEDURE TRAY) ×3 IMPLANT
KIT PORT POWER 8FR ISP CVUE (Catheter) ×3 IMPLANT
KIT PORT POWER 9.6FR MRI PREA (Catheter) IMPLANT
KIT PORT POWER ISP 8FR (Catheter) IMPLANT
KIT POWER CATH 8FR (Catheter) IMPLANT
KIT ROOM TURNOVER OR (KITS) ×3 IMPLANT
NEEDLE 22X1 1/2 (OR ONLY) (NEEDLE) IMPLANT
NEEDLE HYPO 25GX1X1/2 BEV (NEEDLE) ×3 IMPLANT
NS IRRIG 1000ML POUR BTL (IV SOLUTION) ×3 IMPLANT
PACK SURGICAL SETUP 50X90 (CUSTOM PROCEDURE TRAY) ×3 IMPLANT
PAD ARMBOARD 7.5X6 YLW CONV (MISCELLANEOUS) ×3 IMPLANT
PENCIL BUTTON HOLSTER BLD 10FT (ELECTRODE) ×3 IMPLANT
SET INTRODUCER 12FR PACEMAKER (SHEATH) IMPLANT
SET SHEATH INTRODUCER 10FR (MISCELLANEOUS) IMPLANT
SHEATH COOK PEEL AWAY SET 9F (SHEATH) IMPLANT
SUT MNCRL AB 4-0 PS2 18 (SUTURE) ×3 IMPLANT
SUT PROLENE 2 0 SH 30 (SUTURE) ×6 IMPLANT
SUT SILK 2 0 (SUTURE)
SUT SILK 2-0 18XBRD TIE 12 (SUTURE) IMPLANT
SUT VIC AB 3-0 SH 27 (SUTURE) ×2
SUT VIC AB 3-0 SH 27XBRD (SUTURE) ×1 IMPLANT
SYR 20ML ECCENTRIC (SYRINGE) ×6 IMPLANT
SYR 5ML LUER SLIP (SYRINGE) ×3 IMPLANT
SYR CONTROL 10ML LL (SYRINGE) ×3 IMPLANT
SYRINGE 10CC LL (SYRINGE) IMPLANT
TOWEL OR 17X24 6PK STRL BLUE (TOWEL DISPOSABLE) ×3 IMPLANT
TOWEL OR 17X26 10 PK STRL BLUE (TOWEL DISPOSABLE) ×3 IMPLANT

## 2014-01-26 NOTE — H&P (Signed)
Debbie Martinez 01/20/2014 8:52 AM Location: [facility] Patient #: [id number] DOB: Feb 20, 1976 Single / Language: Undefined / Race: Undefined Female  History of Present Illness Debbie Martinez S. Debbie Martinez; 01/20/2014 3:00 PM) The patient is a 38 year old female who presents with breast cancer. We are asked to see the patient in consultation by Dr. Rosana Hoes to evaluate her for a right-sided breast cancer. The patient is a 38 year old black female who recently felt a mass in the upper outer right breast about a month ago. She denies any breast pain. She denies any discharge from her nipple. She does have a family history of breast cancer in her grandmother. She underwent a mammogram which showed a mass in the 2:00 position the right breast measuring 1.9 cm. It also showed an area of abnormal calcification in the 12:00 position of the right breast measuring approximately 2.8 cm by MRI. She also had an abnormal lymph node in the right axilla. All of these were biopsied and came back as invasive breast cancer. The 2 masses in the right breast are separated by proximally 4.5 cm but both appear to be in the upper outer quadrant.   Other Problems Marlowe Kays Goettsch; 01/20/2014 8:53 AM) Back Pain High blood pressure Lump In Breast  Past Surgical History Mirian Mo; 01/20/2014 8:53 AM) Oral Surgery  Diagnostic Studies History Mirian Mo; 01/20/2014 8:53 AM) Colonoscopy never Mammogram within last year Pap Smear 1-5 years ago  Social History Marlowe Kays Goettsch; 01/20/2014 8:53 AM) Alcohol use Occasional alcohol use. Caffeine use Tea. No drug use Tobacco use Never smoker.  Family History Mirian Mo; 01/20/2014 8:53 AM) Hypertension Father.  Pregnancy / Birth History Mirian Mo; 01/20/2014 8:53 AM) Age at menarche 81 years. Contraceptive History Oral contraceptives. Gravida 1 Maternal age 67-30 Para 1 Regular periods  Review of Systems Mirian Mo;  01/20/2014 8:53 AM) General Not Present- Appetite Loss, Chills, Fatigue, Fever, Night Sweats, Weight Gain and Weight Loss. Skin Present- Hives. Not Present- Change in Wart/Mole, Dryness, Jaundice, New Lesions, Non-Healing Wounds, Rash and Ulcer. HEENT Present- Wears glasses/contact lenses. Not Present- Earache, Hearing Loss, Hoarseness, Nose Bleed, Oral Ulcers, Ringing in the Ears, Seasonal Allergies, Sinus Pain, Sore Throat, Visual Disturbances and Yellow Eyes. Breast Present- Breast Mass. Not Present- Breast Pain, Nipple Discharge and Skin Changes. Cardiovascular Not Present- Chest Pain, Difficulty Breathing Lying Down, Leg Cramps, Palpitations, Rapid Heart Rate, Shortness of Breath and Swelling of Extremities. Gastrointestinal Not Present- Abdominal Pain, Bloating, Bloody Stool, Change in Bowel Habits, Chronic diarrhea, Constipation, Difficulty Swallowing, Excessive gas, Gets full quickly at meals, Hemorrhoids, Indigestion, Nausea, Rectal Pain and Vomiting. Female Genitourinary Not Present- Frequency, Nocturia, Painful Urination, Pelvic Pain and Urgency. Musculoskeletal Not Present- Back Pain, Joint Pain, Joint Stiffness, Muscle Pain, Muscle Weakness and Swelling of Extremities. Neurological Not Present- Decreased Memory, Fainting, Headaches, Numbness, Seizures, Tingling, Tremor, Trouble walking and Weakness. Psychiatric Present- Anxiety. Not Present- Bipolar, Change in Sleep Pattern, Depression, Fearful and Frequent crying. Endocrine Not Present- Cold Intolerance, Excessive Hunger, Hair Changes, Heat Intolerance, Hot flashes and New Diabetes. Hematology Not Present- Easy Bruising, Excessive bleeding, Gland problems, HIV and Persistent Infections.   Physical Exam Debbie Martinez S. Debbie Martinez; 01/20/2014 3:02 PM) General Mental Status-Alert. General Appearance-Consistent with stated age. Hydration-Well hydrated. Voice-Normal.  Head and Neck Head-normocephalic, atraumatic with no lesions or  palpable masses. Trachea-midline. Thyroid Gland Characteristics - normal size and consistency.  Eye Eyeball - Bilateral-Extraocular movements intact. Sclera/Conjunctiva - Bilateral-No scleral icterus.  Chest and Lung Exam Chest and lung exam reveals -  quiet, even and easy respiratory effort with no use of accessory muscles and on auscultation, normal breath sounds, no adventitious sounds and normal vocal resonance. Inspection Chest Wall - Normal. Back - normal.  Breast Breast - Left-Symmetric, Non Tender, No Biopsy scars, no Dimpling, No Inflammation, No Lumpectomy scars, No Mastectomy scars, No Peau d' Orange. Note: There is no palpable mass in the left breast. There is no palpable axillary, supraclavicular, or cervical lymphadenopathy on the left Breast - Right-Symmetric, Non Tender, No Biopsy scars, no Dimpling, No Inflammation, No Peau d' Orange. Note: There are 2 palpable masses in the right breast. One is in the 2:00 position and the other in the 12:00 position. Both measure approximately 2 cm in diameter. There is also a palpable enlarged lymph node in the left axilla. Breast Lump-No Palpable Breast Mass.  Cardiovascular Cardiovascular examination reveals -normal heart sounds, regular rate and rhythm with no murmurs and normal pedal pulses bilaterally.  Abdomen Inspection Inspection of the abdomen reveals - No Hernias. Skin - Scar - no surgical scars. Palpation/Percussion Palpation and Percussion of the abdomen reveal - Soft, Non Tender, No Rebound tenderness, No Rigidity (guarding) and No hepatosplenomegaly. Auscultation Auscultation of the abdomen reveals - Bowel sounds normal.  Neurologic Neurologic evaluation reveals -alert and oriented x 3 with no impairment of recent or remote memory. Mental Status-Normal.  Musculoskeletal Normal Exam - Left-Upper Extremity Strength Normal and Lower Extremity Strength Normal. Normal Exam - Right-Upper  Extremity Strength Normal and Lower Extremity Strength Normal.  Lymphatic Head & Neck  General Head & Neck Lymphatics: Bilateral - Description - Normal. Axillary  General Axillary Region: Bilateral - Description - Normal. Tenderness - Non Tender. Femoral & Inguinal  Generalized Femoral & Inguinal Lymphatics: Bilateral - Description - Normal. Tenderness - Non Tender.    Assessment & Plan Debbie Martinez S. Debbie Martinez; 01/20/2014 3:05 PM) BREAST CANCER, RIGHT (174.9  C50.911) MALIGNANT NEOPLASM OF UPPER-OUTER QUADRANT OF RIGHT FEMALE BREAST (174.4  C50.411) Impression: The patient appears to have 2 cancers in the upper outer portion of the right breast with a positive lymph node. I've discussed with her in detail the different options for treatment. At this point we feel that she would benefit from neoadjuvant chemotherapy. She feels strongly that she would like to try breast conservation I think this may be a reasonable choice for her especially if we can shrink the tumor. She would also need a right axillary lymph node dissection at the time of her definitive surgery unless she would like to participate in a study which I've discussed with her today. At this point she will need a Port-A-Cath placed. She will also need genetic spear I've discussed with her in detail the risks and benefits of the operation to place a port as well as some of the technical aspects and she understands and wishes to proceed Current Plans  Follow up in 1 month or as needed   Signed by Luella Cook, Martinez (01/20/2014 3:07 PM)

## 2014-01-26 NOTE — Progress Notes (Signed)
No date for treatment as of today. °

## 2014-01-26 NOTE — Telephone Encounter (Signed)
No additional note

## 2014-01-26 NOTE — Transfer of Care (Signed)
Immediate Anesthesia Transfer of Care Note  Patient: Debbie Martinez  Procedure(s) Performed: Procedure(s): INSERTION PORT-A-CATH (Left)  Patient Location: PACU  Anesthesia Type:General  Level of Consciousness: awake, alert  and oriented  Airway & Oxygen Therapy: Patient Spontanous Breathing and Patient connected to nasal cannula oxygen  Post-op Assessment: Report given to PACU RN, Post -op Vital signs reviewed and stable and Patient moving all extremities X 4  Post vital signs: Reviewed and stable  Complications: No apparent anesthesia complications

## 2014-01-26 NOTE — Interval H&P Note (Signed)
History and Physical Interval Note:  01/26/2014 8:58 AM  Debbie Martinez  has presented today for surgery, with the diagnosis of Right Breast Cancer  The various methods of treatment have been discussed with the patient and family. After consideration of risks, benefits and other options for treatment, the patient has consented to  Procedure(s): INSERTION PORT-A-CATH (N/A) as a surgical intervention .  The patient's history has been reviewed, patient examined, no change in status, stable for surgery.  I have reviewed the patient's chart and labs.  Questions were answered to the patient's satisfaction.     TOTH III,Redina Zeller S

## 2014-01-26 NOTE — Op Note (Signed)
01/26/2014  10:19 AM  PATIENT:  Debbie Martinez  38 y.o. female  PRE-OPERATIVE DIAGNOSIS:  Right Breast Cancer  POST-OPERATIVE DIAGNOSIS:  Right Breast Cancer  PROCEDURE:  Procedure(s): INSERTION PORT-A-CATH (Left)  SURGEON:  Surgeon(s) and Role:    * [name], MD - Primary  PHYSICIAN ASSISTANT:   ASSISTANTS: none   ANESTHESIA:   general  EBL:  Total I/O In: 800 [I.V.:800] Out: 10 [Blood:10]  BLOOD ADMINISTERED:none  DRAINS: none   LOCAL MEDICATIONS USED:  MARCAINE     SPECIMEN:  No Specimen  DISPOSITION OF SPECIMEN:  N/A  COUNTS:  YES  TOURNIQUET:  * No tourniquets in log *  DICTATION: .Dragon Dictation After informed consent was obtained the patient was brought to the operating room and placed in the supine position on the operating room table. After adequate induction of general anesthesia a roll was placed between the patient's shoulder blades to extend the shoulder slightly. The patient's chest and neck area were then prepped with ChloraPrep, allowed to dry, and draped in usual sterile manner. The patient was then placed in Trendelenburg position. The area lateral to the bend of the clavicle and the left chest was then infiltrated with quarter percent Marcaine. A small incision was made lateral to the bend of the clavicle with a 15 blade knife. A large bore needle from the Port-A-Cath kit was then used to slide beneath the bend of the clavicle on the left chest heading towards the sternal notch and in doing so we were able to access the left subclavian vein without difficulty. A wire was fed through the needle using the Seldinger technique without difficulty. The wire was confirmed in the central venous system using real-time fluoroscopy. Next the incision on the left chest was extended slightly. A subcutaneous pocket inferior to the incision was then created by blunt finger dissection and some sharp dissection with the electrocautery. The tubing was attached to  the reservoir the reservoir was placed in the pocket. The length of the tubing was estimated also using real-time fluoroscopy and the tubing was cut to the appropriate length. A sheath and dilator were then fed over the wire using the Seldinger technique without difficulty. The dilator and wire were then removed. The tubing was fed through the sheath as far as it could go and then held in place while the sheath was gently cracked and separated. Another real-time fluoroscopy image showed the tip of the catheter to be in the distal superior vena cava. The tubing was then permanently attached to the reservoir. The reservoir was anchored in the subcutaneous pocket with 2 2-0 Prolene stitches. The port was then aspirated and aspirated blood easily. It was then flushed initially with dilute heparin solution and then with more concentrated heparin solution. The subcutaneous tissue was then closed over the port with interrupted 3-0 Vicryl stitches. The skin was then closed with a running 4-0 Monocryl subcuticular stitch. Dermabond dressings were applied. The patient tolerated the procedure well. At the end of the case all needle sponge and instrument counts were correct. The patient was then awakened and taken to recovery in stable condition.  PLAN OF CARE: Discharge to home after PACU  PATIENT DISPOSITION:  PACU - hemodynamically stable.   Delay start of Pharmacological VTE agent (>24hrs) due to surgical blood loss or risk of bleeding: not applicable

## 2014-01-26 NOTE — Anesthesia Preprocedure Evaluation (Addendum)
Anesthesia Evaluation  Patient identified by MRN, date of birth, ID band Patient awake    Reviewed: Allergy & Precautions, H&P , NPO status , Patient's Chart, lab work & pertinent test results, reviewed documented beta blocker date and time   History of Anesthesia Complications Negative for: history of anesthetic complications  Airway Mallampati: II TM Distance: >3 FB Neck ROM: Full    Dental  (+) Teeth Intact   Pulmonary neg pulmonary ROS,  breath sounds clear to auscultation        Cardiovascular hypertension, Pt. on home beta blockers and Pt. on medications - angina- Past MI and - CHF Rhythm:Regular     Neuro/Psych Anxiety negative neurological ROS     GI/Hepatic Neg liver ROS, GERD-  Controlled,  Endo/Other  negative endocrine ROS  Renal/GU negative Renal ROS     Musculoskeletal   Abdominal   Peds  Hematology   Anesthesia Other Findings   Reproductive/Obstetrics                          Anesthesia Physical Anesthesia Plan  ASA: II  Anesthesia Plan: General   Post-op Pain Management:    Induction: Intravenous  Airway Management Planned: LMA  Additional Equipment: None  Intra-op Plan:   Post-operative Plan: Extubation in OR  Informed Consent: I have reviewed the patients History and Physical, chart, labs and discussed the procedure including the risks, benefits and alternatives for the proposed anesthesia with the patient or authorized representative who has indicated his/her understanding and acceptance.   Dental advisory given  Plan Discussed with: CRNA and Surgeon  Anesthesia Plan Comments:        Anesthesia Quick Evaluation

## 2014-01-26 NOTE — Discharge Instructions (Signed)
What to eat: ° °For your first meals, you should eat lightly; only small meals initially.  If you do not have nausea, you may eat larger meals.  Avoid spicy, greasy and heavy food.   ° °General Anesthesia, Adult, Care After  °Refer to this sheet in the next few weeks. These instructions provide you with information on caring for yourself after your procedure. Your health care provider may also give you more specific instructions. Your treatment has been planned according to current medical practices, but problems sometimes occur. Call your health care provider if you have any problems or questions after your procedure.  °WHAT TO EXPECT AFTER THE PROCEDURE  °After the procedure, it is typical to experience:  °Sleepiness.  °Nausea and vomiting. °HOME CARE INSTRUCTIONS  °For the first 24 hours after general anesthesia:  °Have a responsible person with you.  °Do not drive a car. If you are alone, do not take public transportation.  °Do not drink alcohol.  °Do not take medicine that has not been prescribed by your health care provider.  °Do not sign important papers or make important decisions.  °You may resume a normal diet and activities as directed by your health care provider.  °Change bandages (dressings) as directed.  °If you have questions or problems that seem related to general anesthesia, call the hospital and ask for the anesthetist or anesthesiologist on call. °SEEK MEDICAL CARE IF:  °You have nausea and vomiting that continue the day after anesthesia.  °You develop a rash. °SEEK IMMEDIATE MEDICAL CARE IF:  °You have difficulty breathing.  °You have chest pain.  °You have any allergic problems. °Document Released: 07/16/2000 Document Revised: 12/10/2012 Document Reviewed: 10/23/2012  °ExitCare® Patient Information ©2014 [employer].  ° °Tissue Adhesive Wound Care  ° ° °Some cuts and wounds can be closed with tissue adhesive. Adhesive is like glue. It holds the skin together and helps a wound heal faster.  This adhesive goes away on its own as the wound heals.  °HOME CARE  °Showers are allowed. Do not soak the wound in water. Do not take baths, swim, or use hot tubs. Do not use soaps or creams on your wound.  °If a bandage (dressing) was put on, change it as often as told by your doctor.  °Keep the bandage dry.  °Do not scratch, pick, or rub the adhesive.  °Do not put tape over the adhesive. The adhesive could come off.  °Protect the wound from another injury.  °Protect the wound from sun and tanning beds.  °Only take medicine as told by your doctor.  °Keep all doctor visits as told. °GET HELP RIGHT AWAY IF:  °Your wound is red, puffy (swollen), hot, or tender.  °You get a rash after the glue is put on.  °You have more pain in the wound.  °You have a red streak going away from the wound.  °You have yellowish-white fluid (pus) coming from the wound.  °You have more bleeding.  °You have a fever.  °You have chills and start to shake.  °You notice a bad smell coming from the wound.  °Your wound or adhesive breaks open. °MAKE SURE YOU:  °Understand these instructions.  °Will watch your condition.  °Will get help right away if you are not doing well or get worse. °Document Released: 01/17/2008 Document Revised: 01/28/2013 Document Reviewed: 10/29/2012  °ExitCare® Patient Information ©2015 [employer]. This information is not intended to replace advice given to you by your health care provider.   Make sure you discuss any questions you have with your health care provider.  ° ° °

## 2014-01-26 NOTE — Anesthesia Procedure Notes (Signed)
Procedure Name: LMA Insertion Date/Time: 01/26/2014 9:18 AM Performed by: Rush Farmer E Pre-anesthesia Checklist: Patient identified, Emergency Drugs available, Suction available, Patient being monitored and Timeout performed Patient Re-evaluated:Patient Re-evaluated prior to inductionOxygen Delivery Method: Circle system utilized Preoxygenation: Pre-oxygenation with 100% oxygen Intubation Type: IV induction Ventilation: Mask ventilation without difficulty LMA: LMA inserted LMA Size: 4.0 Number of attempts: 1 Placement Confirmation: positive ETCO2 and breath sounds checked- equal and bilateral Tube secured with: Tape Dental Injury: Teeth and Oropharynx as per pre-operative assessment

## 2014-01-27 ENCOUNTER — Other Ambulatory Visit: Payer: Medicaid Other

## 2014-01-27 ENCOUNTER — Encounter: Payer: Self-pay | Admitting: Hematology and Oncology

## 2014-01-27 ENCOUNTER — Encounter: Payer: Self-pay | Admitting: *Deleted

## 2014-01-27 NOTE — Anesthesia Postprocedure Evaluation (Signed)
  Anesthesia Post-op Note  Patient: Debbie Martinez  Procedure(s) Performed: Procedure(s): INSERTION PORT-A-CATH (Left)  Patient Location: PACU  Anesthesia Type:General  Level of Consciousness: awake  Airway and Oxygen Therapy: Patient Spontanous Breathing  Post-op Pain: none  Post-op Assessment: Post-op Vital signs reviewed, Patient's Cardiovascular Status Stable, Respiratory Function Stable, Patent Airway, No signs of Nausea or vomiting and Pain level controlled  Post-op Vital Signs: Reviewed and stable  Last Vitals:  Filed Vitals:   01/26/14 1109  BP: 128/73  Pulse: 58  Temp:   Resp: 15    Complications: No apparent anesthesia complications

## 2014-01-27 NOTE — Progress Notes (Signed)
Patient inquiring about grant. She said just her and child. She gets child support and living off 401k. I advised we need letter of support for when she is done with 401k funds. She is aware of how the grant 1000.00 works if she starts treatment. Nothing scheduled as of today.

## 2014-01-28 ENCOUNTER — Encounter (HOSPITAL_COMMUNITY): Payer: Self-pay | Admitting: General Surgery

## 2014-01-28 ENCOUNTER — Telehealth: Payer: Self-pay | Admitting: Hematology and Oncology

## 2014-01-28 NOTE — Telephone Encounter (Signed)
s.w. pt and advised on echo...per Tia Masker. Christy at [facility] advised that pt policy does not require pre-cert for 2d echo.

## 2014-01-29 ENCOUNTER — Telehealth: Payer: Self-pay | Admitting: Hematology and Oncology

## 2014-01-29 ENCOUNTER — Ambulatory Visit (HOSPITAL_COMMUNITY): Payer: BC Managed Care – PPO

## 2014-01-29 NOTE — Telephone Encounter (Signed)
lvm for pt that Echo had to be moved to 10.16 due to being dbl booked.

## 2014-02-01 ENCOUNTER — Ambulatory Visit (HOSPITAL_COMMUNITY): Payer: BC Managed Care – PPO

## 2014-02-01 ENCOUNTER — Telehealth: Payer: Self-pay | Admitting: *Deleted

## 2014-02-01 NOTE — Telephone Encounter (Signed)
Spoke with patient from [facility] 01/20/14.  Confirmed follow up appointment with Dr. Lindi Adie for 02/08/14 at 1pm.  Encourage patient to call with any needs.

## 2014-02-05 ENCOUNTER — Ambulatory Visit (HOSPITAL_COMMUNITY)
Admission: RE | Admit: 2014-02-05 | Discharge: 2014-02-05 | Disposition: A | Discharge status: Home / Self Care | Payer: BC Managed Care – PPO | Source: Ambulatory Visit | Attending: Hematology and Oncology | Admitting: Hematology and Oncology

## 2014-02-05 DIAGNOSIS — C50919 Malignant neoplasm of unspecified site of unspecified female breast: Secondary | ICD-10-CM | POA: Diagnosis not present

## 2014-02-05 DIAGNOSIS — C50211 Malignant neoplasm of upper-inner quadrant of right female breast: Secondary | ICD-10-CM

## 2014-02-05 NOTE — Progress Notes (Signed)
Echocardiogram 2D Echocardiogram has been performed.  Trenise Turay 02/05/2014, 10:54 AM

## 2014-02-08 ENCOUNTER — Encounter: Payer: Self-pay | Admitting: *Deleted

## 2014-02-08 ENCOUNTER — Encounter: Payer: Self-pay | Admitting: Hematology and Oncology

## 2014-02-08 ENCOUNTER — Ambulatory Visit (HOSPITAL_BASED_OUTPATIENT_CLINIC_OR_DEPARTMENT_OTHER): Payer: Medicaid Other | Admitting: Hematology and Oncology

## 2014-02-08 VITALS — BP 134/69 | HR 81 | Temp 97.3°F | Resp 18 | Ht 67.0 in | Wt 173.1 lb

## 2014-02-08 DIAGNOSIS — C773 Secondary and unspecified malignant neoplasm of axilla and upper limb lymph nodes: Secondary | ICD-10-CM

## 2014-02-08 DIAGNOSIS — C50211 Malignant neoplasm of upper-inner quadrant of right female breast: Secondary | ICD-10-CM

## 2014-02-08 DIAGNOSIS — Z170 Estrogen receptor positive status [ER+]: Secondary | ICD-10-CM

## 2014-02-08 MED ORDER — LIDOCAINE-PRILOCAINE 2.5-2.5 % EX CREA: 1.0000 "application " | TOPICAL_CREAM | CUTANEOUS | Status: DC | PRN

## 2014-02-08 MED ORDER — LORAZEPAM 0.5 MG PO TABS: 0.5000 mg | ORAL_TABLET | Freq: Four times a day (QID) | ORAL | Status: DC | PRN

## 2014-02-08 MED ORDER — ONDANSETRON HCL 8 MG PO TABS: 8.0000 mg | ORAL_TABLET | Freq: Two times a day (BID) | ORAL | Status: DC | PRN

## 2014-02-08 MED ORDER — PROCHLORPERAZINE MALEATE 10 MG PO TABS: 10.0000 mg | ORAL_TABLET | Freq: Four times a day (QID) | ORAL | Status: DC | PRN

## 2014-02-08 MED ORDER — DEXAMETHASONE 4 MG PO TABS
ORAL_TABLET | ORAL | Status: DC
Start: 2014-02-08 — End: 2014-04-07

## 2014-02-08 NOTE — Progress Notes (Signed)
Confirmed 1st chemo for 02/11/14 at 0830. Debbie Martinez scheduled appt. Discussed pointers with pt concerning emla cream and possible things to monitor during 1st chemo. Gave encouragement and positive reinforcement. Pt denies further needs. Encourage pt to call with questions. Contact information given.

## 2014-02-08 NOTE — Progress Notes (Signed)
Put disability form on nurse's desk. °

## 2014-02-08 NOTE — Assessment & Plan Note (Signed)
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 It once again discussed with her chemotherapy risks and benefits. She signed the consent form and is ready to start treatment. We will plan on starting her treatment on Thursday.  I discussed in detail the antiemetics regimen in all of her questions have been answered. Patient did not show any interest in this patient to PREVENT clinical trial.  Patient signed the consent form and we'll initiate treatment this Thursday.

## 2014-02-08 NOTE — Progress Notes (Signed)
Patient Care Team: Eloise Levels, NP as PCP - General (Nurse Practitioner) Autumn Messing III, MD as Consulting Physician (General Surgery) Rulon Eisenmenger, MD as Consulting Physician (Hematology and Oncology) [name], MD as Attending Physician (Radiation Oncology)  DIAGNOSIS: Breast cancer of upper-inner quadrant of right female breast   Primary site: Breast (Right)   Staging method: AJCC 7th Edition   Clinical: Stage IIB (T2, N1, cM0)   Summary: Stage IIB (T2, N1, cM0)   Clinical comments: Staged at breast conference 01/20/14.   SUMMARY OF ONCOLOGIC HISTORY:   Breast cancer of upper-inner quadrant of right female breast   01/12/2014 Mammogram Right breast 2:00 position 1.6 x 1.9 cm and the 12:00 position 0.5 x 0.9 cm distance between the 2 was 4.5 cm, right axillary lymph node enlargement   01/12/2014 Initial Diagnosis All 3 biopsies were IDC with DCIS ER percent PR 7200% Ki-67 85% HER-2 negative: Right axillary lymph node also positive for metastatic cancer ER positive HER-2 negative Ki-67 80% grade 2   01/18/2014 Breast MRI Right breast 12:00 position 2.8 x 2 x 2.2 cm: 2:00 position 1.3 x 0.8 x 0.5 cm, right axilla multiple enlarged lymph nodes 1 cm in size, right retropectoral lymph node 0.6 cm    CHIEF COMPLIANT: Patient is see her to sign consent forms and to finalize neoadjuvant chemotherapy plan  INTERVAL HISTORY: Debbie Martinez is a 38 year old American lady with above-mentioned history of right-sided breast cancer. She had multifocal disease and we recommended treatment neoadjuvant chemotherapy with Adriamycin Cytoxan followed by weekly Taxol x12. Patient underwent echocardiogram which was normal. She underwent port placement and went through chemotherapy class. She reports that the chemotherapy class was extremely useful. She is ready to start chemotherapy any time. She denies any new complaints or concerns.  REVIEW OF SYSTEMS:   Constitutional: Denies fevers, chills or  abnormal weight loss Eyes: Denies blurriness of vision Ears, nose, mouth, throat, and face: Denies mucositis or sore throat Respiratory: Denies cough, dyspnea or wheezes Cardiovascular: Denies palpitation, chest discomfort or lower extremity swelling Gastrointestinal:  Denies nausea, heartburn or change in bowel habits Skin: Denies abnormal skin rashes Lymphatics: Denies new lymphadenopathy or easy bruising Neurological:Denies numbness, tingling or new weaknesses Behavioral/Psych: Mood is stable, no new changes  Breast:  denies any pain in either breasts All other systems were reviewed with the patient and are negative.  I have reviewed the past medical history, past surgical history, social history and family history with the patient and they are unchanged from previous note.  ALLERGIES:  is allergic to lisinopril.  MEDICATIONS:  Current Outpatient Prescriptions  Medication Sig Dispense Refill  . atenolol (TENORMIN) 50 MG tablet Take 50 mg by mouth daily.      Marland Kitchen dexamethasone (DECADRON) 4 MG tablet Take 2 tablets by mouth once a day on the day after chemotherapy and then take 2 tablets two times a day for 2 days. Take with food.  30 tablet  1  . ENSURE (ENSURE) Take 237 mLs by mouth.      . ferrous sulfate 325 (65 FE) MG tablet Take 325 mg by mouth daily with breakfast.      . lidocaine-prilocaine (EMLA) cream Apply 1 application topically as needed.  30 g  0  . LORazepam (ATIVAN) 0.5 MG tablet Take 1 tablet (0.5 mg total) by mouth every 6 (six) hours as needed for anxiety.  30 tablet  0  . ondansetron (ZOFRAN) 8 MG tablet Take 1 tablet (8 mg total)  by mouth 2 (two) times daily as needed. Start on the third day after chemotherapy.  30 tablet  1  . oxyCODONE-acetaminophen (ROXICET) 5-325 MG per tablet Take 1-2 tablets by mouth every 4 (four) hours as needed.  50 tablet  0  . prochlorperazine (COMPAZINE) 10 MG tablet Take 1 tablet (10 mg total) by mouth every 6 (six) hours as needed  (Nausea or vomiting).  30 tablet  1   No current facility-administered medications for this visit.    PHYSICAL EXAMINATION: ECOG PERFORMANCE STATUS: 0 - Asymptomatic  Filed Vitals:   02/08/14 1318  BP: 134/69  Pulse: 81  Temp: 97.3 F (36.3 C)  Resp: 18   Filed Weights   02/08/14 1318  Weight: 173 lb 1.6 oz (78.518 kg)    GENERAL:alert, no distress and comfortable SKIN: skin color, texture, turgor are normal, no rashes or significant lesions EYES: normal, Conjunctiva are pink and non-injected, sclera clear OROPHARYNX:no exudate, no erythema and lips, buccal mucosa, and tongue normal  NECK: supple, thyroid normal size, non-tender, without nodularity LYMPH:  no palpable lymphadenopathy in the cervical, axillary or inguinal LUNGS: clear to auscultation and percussion with normal breathing effort HEART: regular rate & rhythm and no murmurs and no lower extremity edema ABDOMEN:abdomen soft, non-tender and normal bowel sounds Musculoskeletal:no cyanosis of digits and no clubbing  NEURO: alert & oriented x 3 with fluent speech, no focal motor/sensory deficits  LABORATORY DATA:  I have reviewed the data as listed   Chemistry      Component Value Date/Time   NA 139 01/20/2014 1149   K 3.8 01/20/2014 1149   CO2 26 01/20/2014 1149   BUN 9.3 01/20/2014 1149   CREATININE 0.8 01/20/2014 1149      Component Value Date/Time   CALCIUM 9.8 01/20/2014 1149   ALKPHOS 57 01/20/2014 1149   AST 9 01/20/2014 1149   ALT <6 01/20/2014 1149   BILITOT 0.43 01/20/2014 1149       Lab Results  Component Value Date   WBC 6.8 01/20/2014   HGB 11.7 01/20/2014   HCT 36.9 01/20/2014   MCV 87.5 01/20/2014   PLT 234 01/20/2014   NEUTROABS 4.2 01/20/2014     RADIOGRAPHIC STUDIES: I have personally reviewed the radiology reports and agreed with their findings. 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 It once again discussed with her chemotherapy risks and benefits. She signed the consent form and is ready to start treatment. We will plan on starting her treatment on Thursday.  I discussed in detail the antiemetics regimen in all of her questions have been answered. Patient did not show any interest in this patient to PREVENT clinical trial.  Patient signed the consent form and we'll initiate treatment this Thursday.   Orders Placed This Encounter  Procedures  . PHYSICIAN COMMUNICATION ORDER    A baseline Echo/ [place] should be obtained prior to initiation of Anthracycline Chemotherapy   The patient has a good understanding of the overall plan. she agrees with it. She will call with any problems that may develop before her next visit here.  I spent 15 minutes counseling the patient face to face. The total time spent in the appointment was 20 minutes and more  than 50% was on counseling and review of test results    Rulon Eisenmenger, MD 02/08/2014 2:57 PM

## 2014-02-09 ENCOUNTER — Encounter: Payer: Self-pay | Admitting: Hematology and Oncology

## 2014-02-09 NOTE — Progress Notes (Signed)
Called and left message for patient to call me. Per Nevin Bloodgood at [employer] termed 01/05/14. We need new ins info for pre auth

## 2014-02-10 ENCOUNTER — Telehealth: Payer: Self-pay | Admitting: *Deleted

## 2014-02-10 ENCOUNTER — Encounter: Payer: Self-pay | Admitting: Hematology and Oncology

## 2014-02-10 NOTE — Telephone Encounter (Signed)
Confirmed with pt she will be here for 1st [facility] treatment. Pt denies needs.

## 2014-02-10 NOTE — Progress Notes (Signed)
Called the patient back. She is not working and she said she applied for medicaid and not sure how long ago. I advised her I called [employer] and her ins termed 01/05/14. I told her would have nurse call her back about what she wants to do about appt tomorrow. She said it really a bad time right now. I advised without ins all will be self pay. She said ok for nurse to call her back at home# . I will try and verify medicaid status with DSS and get her case worker name-she said she could not remember her name??

## 2014-02-10 NOTE — Progress Notes (Signed)
Msg received from Raquel - Pt is due for 1st treatment tomorrow.  Pt does not have insurance.  She has applied for medicaid - which Raquel will verify.  Dr Lindi Adie needs to be consulted as to what he wants to do.  Also, Pt is expecting a call back.  [place] patient - message given to navigators.

## 2014-02-10 NOTE — Progress Notes (Signed)
Put disability form in registration desk. °

## 2014-02-10 NOTE — Progress Notes (Signed)
Verified with patient and case worker ms singh at [id number], she has full medicaid 01/21/14-01/21/15. She doesn't have card yet.  [id number] N. [employer] termed 01/05/14. She will make appt on 02/11/14.

## 2014-02-11 ENCOUNTER — Other Ambulatory Visit: Payer: Self-pay

## 2014-02-11 ENCOUNTER — Ambulatory Visit (HOSPITAL_BASED_OUTPATIENT_CLINIC_OR_DEPARTMENT_OTHER): Payer: BC Managed Care – PPO

## 2014-02-11 ENCOUNTER — Telehealth: Payer: Self-pay | Admitting: *Deleted

## 2014-02-11 VITALS — BP 132/84 | HR 74 | Temp 97.6°F | Resp 17

## 2014-02-11 DIAGNOSIS — Z5111 Encounter for antineoplastic chemotherapy: Secondary | ICD-10-CM

## 2014-02-11 DIAGNOSIS — C50211 Malignant neoplasm of upper-inner quadrant of right female breast: Secondary | ICD-10-CM

## 2014-02-11 DIAGNOSIS — C50811 Malignant neoplasm of overlapping sites of right female breast: Secondary | ICD-10-CM

## 2014-02-11 DIAGNOSIS — C773 Secondary and unspecified malignant neoplasm of axilla and upper limb lymph nodes: Secondary | ICD-10-CM

## 2014-02-11 MED ORDER — DOXORUBICIN HCL CHEMO IV INJECTION 2 MG/ML
60.0000 mg/m2 | Freq: Once | INTRAVENOUS | Status: AC
  Administered 2014-02-11: 116 mg via INTRAVENOUS
  Filled 2014-02-11: qty 58

## 2014-02-11 MED ORDER — DEXAMETHASONE SODIUM PHOSPHATE 20 MG/5ML IJ SOLN
INTRAMUSCULAR | Status: AC
  Filled 2014-02-11: qty 5

## 2014-02-11 MED ORDER — SODIUM CHLORIDE 0.9 % IV SOLN
Freq: Once | INTRAVENOUS | Status: AC
  Administered 2014-02-11: 09:00:00 via INTRAVENOUS

## 2014-02-11 MED ORDER — SODIUM CHLORIDE 0.9 % IV SOLN
150.0000 mg | Freq: Once | INTRAVENOUS | Status: AC
  Administered 2014-02-11: 150 mg via INTRAVENOUS
  Filled 2014-02-11: qty 5

## 2014-02-11 MED ORDER — SODIUM CHLORIDE 0.9 % IV SOLN
600.0000 mg/m2 | Freq: Once | INTRAVENOUS | Status: AC
  Administered 2014-02-11: 1160 mg via INTRAVENOUS
  Filled 2014-02-11: qty 58

## 2014-02-11 MED ORDER — PALONOSETRON HCL INJECTION 0.25 MG/5ML
0.2500 mg | Freq: Once | INTRAVENOUS | Status: AC
  Administered 2014-02-11: 0.25 mg via INTRAVENOUS

## 2014-02-11 MED ORDER — HEPARIN SOD (PORK) LOCK FLUSH 100 UNIT/ML IV SOLN
500.0000 [IU] | Freq: Once | INTRAVENOUS | Status: AC | PRN
  Administered 2014-02-11: 500 [IU]
  Filled 2014-02-11: qty 5

## 2014-02-11 MED ORDER — SODIUM CHLORIDE 0.9 % IJ SOLN
10.0000 mL | INTRAMUSCULAR | Status: DC | PRN
  Administered 2014-02-11: 10 mL
  Filled 2014-02-11: qty 10

## 2014-02-11 MED ORDER — UNABLE TO FIND: 1.0000 | Status: DC | PRN

## 2014-02-11 MED ORDER — DEXAMETHASONE SODIUM PHOSPHATE 20 MG/5ML IJ SOLN
12.0000 mg | Freq: Once | INTRAMUSCULAR | Status: AC
  Administered 2014-02-11: 12 mg via INTRAVENOUS

## 2014-02-11 MED ORDER — PALONOSETRON HCL INJECTION 0.25 MG/5ML
INTRAVENOUS | Status: AC
  Filled 2014-02-11: qty 5

## 2014-02-11 NOTE — Telephone Encounter (Signed)
Per pharmacy I have ascheduled injection

## 2014-02-11 NOTE — Patient Instructions (Signed)
[facility] Discharge Instructions for Patients Receiving Chemotherapy  Today you received the following chemotherapy agents Adriamycin and Cytoxan.  To help prevent nausea and vomiting after your treatment, we encourage you to take your nausea medication as instructed by your physician.   If you develop nausea and vomiting that is not controlled by your nausea medication, call the clinic.   BELOW ARE SYMPTOMS THAT SHOULD BE REPORTED IMMEDIATELY:  *FEVER GREATER THAN 100.5 F  *CHILLS WITH OR WITHOUT FEVER  NAUSEA AND VOMITING THAT IS NOT CONTROLLED WITH YOUR NAUSEA MEDICATION  *UNUSUAL SHORTNESS OF BREATH  *UNUSUAL BRUISING OR BLEEDING  TENDERNESS IN MOUTH AND THROAT WITH OR WITHOUT PRESENCE OF ULCERS  *URINARY PROBLEMS  *BOWEL PROBLEMS  UNUSUAL RASH Items with * indicate a potential emergency and should be followed up as soon as possible.  Feel free to call the clinic you have any questions or concerns. The clinic phone number is [phone].

## 2014-02-11 NOTE — Progress Notes (Signed)
1135 Patient tolerated treatments well. Reported some nasal burning at the completion of Cytoxan, after approx 50 ml of NS running in open line, patient reported nasal burning gone. Patient d/c with spouse. Ambulating. AVS provided and patient knows to return tomorrow for Neulasta injection appt. Education provided with pt on when to MD/Clinic to report any concerns or adverse symptoms Patient expressed understanding. Denies further questions at this time.

## 2014-02-12 ENCOUNTER — Telehealth: Payer: Self-pay | Admitting: *Deleted

## 2014-02-12 ENCOUNTER — Ambulatory Visit (HOSPITAL_BASED_OUTPATIENT_CLINIC_OR_DEPARTMENT_OTHER): Payer: Medicaid Other

## 2014-02-12 VITALS — BP 127/75 | HR 67 | Temp 98.2°F

## 2014-02-12 DIAGNOSIS — Z5189 Encounter for other specified aftercare: Secondary | ICD-10-CM

## 2014-02-12 DIAGNOSIS — C773 Secondary and unspecified malignant neoplasm of axilla and upper limb lymph nodes: Secondary | ICD-10-CM

## 2014-02-12 DIAGNOSIS — C50211 Malignant neoplasm of upper-inner quadrant of right female breast: Secondary | ICD-10-CM

## 2014-02-12 DIAGNOSIS — C50811 Malignant neoplasm of overlapping sites of right female breast: Secondary | ICD-10-CM

## 2014-02-12 MED ORDER — PEGFILGRASTIM INJECTION 6 MG/0.6ML
6.0000 mg | Freq: Once | SUBCUTANEOUS | Status: AC
  Administered 2014-02-12: 6 mg via SUBCUTANEOUS
  Filled 2014-02-12: qty 0.6

## 2014-02-12 NOTE — Progress Notes (Signed)
Pt states she feels tired and slightly nauseated.  Has not been throwing up.  Pt has been taking her nausea medication as prescribed and able to keep down applesauce.  Pt instructed to call cancer center if conditions worsen or if she develops a fever.  Pt verbalizes understanding.

## 2014-02-12 NOTE — Patient Instructions (Signed)
Pegfilgrastim injection What is this medicine? PEGFILGRASTIM (peg fil GRA stim) is a long-acting granulocyte colony-stimulating factor that stimulates the growth of neutrophils, a type of white blood cell important in the body's fight against infection. It is used to reduce the incidence of fever and infection in patients with certain types of cancer who are receiving chemotherapy that affects the bone marrow. This medicine may be used for other purposes; ask your health care provider or pharmacist if you have questions. COMMON BRAND NAME(S): Neulasta What should I tell my health care provider before I take this medicine? They need to know if you have any of these conditions: -latex allergy -ongoing radiation therapy -sickle cell disease -skin reactions to acrylic adhesives (On-Body Injector only) -an unusual or allergic reaction to pegfilgrastim, filgrastim, other medicines, foods, dyes, or preservatives -pregnant or trying to get pregnant -breast-feeding How should I use this medicine? This medicine is for injection under the skin. If you get this medicine at home, you will be taught how to prepare and give the pre-filled syringe or how to use the On-body Injector. Refer to the patient Instructions for Use for detailed instructions. Use exactly as directed. Take your medicine at regular intervals. Do not take your medicine more often than directed. It is important that you put your used needles and syringes in a special sharps container. Do not put them in a trash can. If you do not have a sharps container, call your pharmacist or healthcare provider to get one. Talk to your pediatrician regarding the use of this medicine in children. Special care may be needed. Overdosage: If you think you have taken too much of this medicine contact a poison control center or emergency room at once. NOTE: This medicine is only for you. Do not share this medicine with others. What if I miss a dose? It is  important not to miss your dose. Call your doctor or health care professional if you miss your dose. If you miss a dose due to an On-body Injector failure or leakage, a new dose should be administered as soon as possible using a single prefilled syringe for manual use. What may interact with this medicine? Interactions have not been studied. Give your health care provider a list of all the medicines, herbs, non-prescription drugs, or dietary supplements you use. Also tell them if you smoke, drink alcohol, or use illegal drugs. Some items may interact with your medicine. This list may not describe all possible interactions. Give your health care provider a list of all the medicines, herbs, non-prescription drugs, or dietary supplements you use. Also tell them if you smoke, drink alcohol, or use illegal drugs. Some items may interact with your medicine. What should I watch for while using this medicine? You may need blood work done while you are taking this medicine. If you are going to need a MRI, CT scan, or other procedure, tell your doctor that you are using this medicine (On-Body Injector only). What side effects may I notice from receiving this medicine? Side effects that you should report to your doctor or health care professional as soon as possible: -allergic reactions like skin rash, itching or hives, swelling of the face, lips, or tongue -dizziness -fever -pain, redness, or irritation at site where injected -pinpoint red spots on the skin -shortness of breath or breathing problems -stomach or side pain, or pain at the shoulder -swelling -tiredness -trouble passing urine Side effects that usually do not require medical attention (report to your doctor   or health care professional if they continue or are bothersome): -bone pain -muscle pain This list may not describe all possible side effects. Call your doctor for medical advice about side effects. You may report side effects to FDA at  1-800-FDA-1088. Where should I keep my medicine? Keep out of the reach of children. Store pre-filled syringes in a refrigerator between 2 and 8 degrees C (36 and 46 degrees F). Do not freeze. Keep in carton to protect from light. Throw away this medicine if it is left out of the refrigerator for more than 48 hours. Throw away any unused medicine after the expiration date. NOTE: This sheet is a summary. It may not cover all possible information. If you have questions about this medicine, talk to your doctor, pharmacist, or health care provider.  2015, Elsevier/Gold Standard. (2013-07-09 16:14:05)  

## 2014-02-12 NOTE — Telephone Encounter (Signed)
Post chemo follow up done by Sharyn Lull, injection nurse.

## 2014-02-18 ENCOUNTER — Telehealth: Payer: Self-pay | Admitting: Genetic Counselor

## 2014-02-18 ENCOUNTER — Encounter: Payer: Self-pay | Admitting: Genetic Counselor

## 2014-02-18 NOTE — Telephone Encounter (Signed)
Revealed negative genetic testing on her Breast/Ovarian cancer panel.

## 2014-02-19 ENCOUNTER — Telehealth: Payer: Self-pay

## 2014-02-19 NOTE — Telephone Encounter (Signed)
LMOVM - Pt needs to be seen on Monday 11/2 for post chemo follow up.  Appt for labs at 215 and OV 245.  Pt to call clinic if she has any questions.

## 2014-02-20 ENCOUNTER — Telehealth: Payer: Self-pay | Admitting: Hematology and Oncology

## 2014-02-20 NOTE — Telephone Encounter (Signed)
s.w pt and advised on NOV appt....pt ok adn aware °

## 2014-02-22 ENCOUNTER — Ambulatory Visit (HOSPITAL_BASED_OUTPATIENT_CLINIC_OR_DEPARTMENT_OTHER): Payer: Medicaid Other | Admitting: Adult Health

## 2014-02-22 ENCOUNTER — Telehealth: Payer: Self-pay | Admitting: Adult Health

## 2014-02-22 ENCOUNTER — Other Ambulatory Visit: Payer: Medicaid Other

## 2014-02-22 ENCOUNTER — Other Ambulatory Visit (HOSPITAL_BASED_OUTPATIENT_CLINIC_OR_DEPARTMENT_OTHER): Payer: Medicaid Other

## 2014-02-22 ENCOUNTER — Encounter: Payer: Self-pay | Admitting: Adult Health

## 2014-02-22 VITALS — BP 120/62 | HR 54 | Temp 99.3°F | Resp 20 | Ht 67.0 in | Wt 175.1 lb

## 2014-02-22 DIAGNOSIS — C773 Secondary and unspecified malignant neoplasm of axilla and upper limb lymph nodes: Secondary | ICD-10-CM

## 2014-02-22 DIAGNOSIS — C50211 Malignant neoplasm of upper-inner quadrant of right female breast: Secondary | ICD-10-CM

## 2014-02-22 LAB — COMPREHENSIVE METABOLIC PANEL (CC13)
ALBUMIN: 3.8 g/dL (ref 3.5–5.0)
ALK PHOS: 89 U/L (ref 40–150)
ALT: 18 U/L (ref 0–55)
AST: 12 U/L (ref 5–34)
Anion Gap: 6 mEq/L (ref 3–11)
BILIRUBIN TOTAL: 0.26 mg/dL (ref 0.20–1.20)
BUN: 9 mg/dL (ref 7.0–26.0)
CO2: 23 mEq/L (ref 22–29)
Calcium: 9.3 mg/dL (ref 8.4–10.4)
Chloride: 109 mEq/L (ref 98–109)
Creatinine: 0.7 mg/dL (ref 0.6–1.1)
Glucose: 90 mg/dl (ref 70–140)
Potassium: 3.9 mEq/L (ref 3.5–5.1)
Sodium: 137 mEq/L (ref 136–145)
Total Protein: 7.5 g/dL (ref 6.4–8.3)

## 2014-02-22 LAB — CBC WITH DIFFERENTIAL/PLATELET
BASO%: 0.5 % (ref 0.0–2.0)
Basophils Absolute: 0.1 10*3/uL (ref 0.0–0.1)
EOS ABS: 0.1 10*3/uL (ref 0.0–0.5)
EOS%: 0.3 % (ref 0.0–7.0)
HCT: 34.6 % — ABNORMAL LOW (ref 34.8–46.6)
HGB: 10.8 g/dL — ABNORMAL LOW (ref 11.6–15.9)
LYMPH%: 9.5 % — AB (ref 14.0–49.7)
MCH: 27.2 pg (ref 25.1–34.0)
MCHC: 31.3 g/dL — AB (ref 31.5–36.0)
MCV: 86.8 fL (ref 79.5–101.0)
MONO#: 0.6 10*3/uL (ref 0.1–0.9)
MONO%: 3.2 % (ref 0.0–14.0)
NEUT#: 17.1 10*3/uL — ABNORMAL HIGH (ref 1.5–6.5)
NEUT%: 86.5 % — ABNORMAL HIGH (ref 38.4–76.8)
PLATELETS: 177 10*3/uL (ref 145–400)
RBC: 3.99 10*6/uL (ref 3.70–5.45)
RDW: 12.7 % (ref 11.2–14.5)
WBC: 19.8 10*3/uL — AB (ref 3.9–10.3)
lymph#: 1.9 10*3/uL (ref 0.9–3.3)

## 2014-02-22 NOTE — Progress Notes (Signed)
Patient Care Team: Eloise Levels, NP as PCP - General (Nurse Practitioner) Autumn Messing III, MD as Consulting Physician (General Surgery) Rulon Eisenmenger, MD as Consulting Physician (Hematology and Oncology) [name], MD as Attending Physician (Radiation Oncology)  DIAGNOSIS: Breast cancer of upper-inner quadrant of right female breast   Primary site: Breast (Right)   Staging method: AJCC 7th Edition   Clinical: Stage IIB (T2, N1, cM0)   Summary: Stage IIB (T2, N1, cM0)   Clinical comments: Staged at breast conference 01/20/14.   SUMMARY OF ONCOLOGIC HISTORY:   Breast cancer of upper-inner quadrant of right female breast   01/12/2014 Mammogram Right breast 2:00 position 1.6 x 1.9 cm and the 12:00 position 0.5 x 0.9 cm distance between the 2 was 4.5 cm, right axillary lymph node enlargement   01/12/2014 Initial Diagnosis All 3 biopsies were IDC with DCIS ER percent PR 7200% Ki-67 85% HER-2 negative: Right axillary lymph node also positive for metastatic cancer ER positive HER-2 negative Ki-67 80% grade 2   01/18/2014 Breast MRI Right breast 12:00 position 2.8 x 2 x 2.2 cm: 2:00 position 1.3 x 0.8 x 0.5 cm, right axilla multiple enlarged lymph nodes 1 cm in size, right retropectoral lymph node 0.6 cm   02/11/2014 -  Chemotherapy Doxorubicin and Cyclophosphamide given on day 1 of a 14 day cycle with Neulasta given on day 2 for granulocyte support.  A total of four cycles are planned.      CHIEF COMPLIANT: neoadjuvant chemotherapy  INTERVAL HISTORY: Debbie Martinez is a 38 year old American lady with right breast invasive ductal carcinoma here for follow up while receiving neoadjuvant chemotherapy with Doxorubicin and Cyclophosphamide.  She did have some nausea and vomiting on day 3.  The anti-emetics did help with this.  She did have some indigestion and took Maalox for this and it did help.  She wants to know if she can continue to do so.  She denies fevers, chills, constipation, diarrhea,  numbness/tingling, skin changes, nail changes, or any other concerns.   REVIEW OF SYSTEMS:   A 10 point review of systems was conducted and is otherwise negative except for what is noted above.    Past Medical History  Diagnosis Date  . Hypertension   . Breast cancer 01/20/14    triple positive  . Anxiety     gets sweaty and short of breath  . Shortness of breath     'anxiety'  . GERD (gastroesophageal reflux disease)     does not take anything  . Gestational diabetes    Past Surgical History  Procedure Laterality Date  . Keloid excision Left     ear  . Portacath placement Left 01/26/2014    Procedure: INSERTION PORT-A-CATH;  Surgeon: Autumn Messing III, MD;  Location: [facility];  Service: General;  Laterality: Left;   History   Social History  . Marital Status: Single    Spouse Name: N/A    Number of Children: N/A  . Years of Education: N/A   Social History Main Topics  . Smoking status: Never Smoker   . Smokeless tobacco: None  . Alcohol Use: Yes     Comment: Rarely- maybe  every 6 months  . Drug Use: No  . Sexual Activity: None   Other Topics Concern  . None   Social History Narrative   Family History  Problem Relation Age of Onset  . Heart attack Father 24  . Lupus Maternal Aunt   . Prostate cancer Maternal  Grandfather 73  . Dementia Paternal Grandmother   . Leukemia Cousin 40    paternal first cousin     ALLERGIES:  is allergic to lisinopril.  MEDICATIONS:  Current Outpatient Prescriptions  Medication Sig Dispense Refill  . atenolol (TENORMIN) 50 MG tablet Take 50 mg by mouth daily.    Marland Kitchen dexamethasone (DECADRON) 4 MG tablet Take 2 tablets by mouth once a day on the day after chemotherapy and then take 2 tablets two times a day for 2 days. Take with food. 30 tablet 1  . ENSURE (ENSURE) Take 237 mLs by mouth.    . ferrous sulfate 325 (65 FE) MG tablet Take 325 mg by mouth daily with breakfast.    . lidocaine-prilocaine (EMLA) cream Apply 1 application  topically as needed. 30 g 0  . LORazepam (ATIVAN) 0.5 MG tablet Take 1 tablet (0.5 mg total) by mouth every 6 (six) hours as needed for anxiety. 30 tablet 0  . ondansetron (ZOFRAN) 8 MG tablet Take 1 tablet (8 mg total) by mouth 2 (two) times daily as needed. Start on the third day after chemotherapy. 30 tablet 1  . oxyCODONE-acetaminophen (ROXICET) 5-325 MG per tablet Take 1-2 tablets by mouth every 4 (four) hours as needed. 50 tablet 0  . prochlorperazine (COMPAZINE) 10 MG tablet Take 1 tablet (10 mg total) by mouth every 6 (six) hours as needed (Nausea or vomiting). 30 tablet 1  . UNABLE TO FIND Apply 1 each topically as needed. 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    PHYSICAL EXAMINATION: ECOG PERFORMANCE STATUS: 0 - Asymptomatic  Filed Vitals:   02/22/14 1531  BP: 120/62  Pulse: 54  Temp: 99.3 F (37.4 C)  Resp: 20   Filed Weights   02/22/14 1531  Weight: 175 lb 1.6 oz (79.425 kg)    GENERAL: Patient is a well appearing female in no acute distress HEENT:  Sclerae anicteric.  Oropharynx clear and moist. No ulcerations or evidence of oropharyngeal candidiasis. Neck is supple.  NODES:  No cervical, supraclavicular, or axillary lymphadenopathy palpated.  BREAST EXAM:  Deferred. LUNGS:  Clear to auscultation bilaterally.  No wheezes or rhonchi. HEART:  Regular rate and rhythm. No murmur appreciated. ABDOMEN:  Soft, nontender.  Positive, normoactive bowel sounds. No organomegaly palpated. MSK:  No focal spinal tenderness to palpation. Full range of motion bilaterally in the upper extremities. EXTREMITIES:  No peripheral edema.   SKIN:  Clear with no obvious rashes or skin changes. No nail dyscrasia. NEURO:  Nonfocal. Well oriented.  Appropriate affect.    LABORATORY DATA:  I have reviewed the data as listed   Chemistry      Component Value Date/Time   NA 139 01/20/2014 1149    K 3.8 01/20/2014 1149   CO2 26 01/20/2014 1149   BUN 9.3 01/20/2014 1149   CREATININE 0.8 01/20/2014 1149      Component Value Date/Time   CALCIUM 9.8 01/20/2014 1149   ALKPHOS 57 01/20/2014 1149   AST 9 01/20/2014 1149   ALT <6 01/20/2014 1149   BILITOT 0.43 01/20/2014 1149       Lab Results  Component Value Date   WBC 19.8* 02/22/2014   HGB 10.8* 02/22/2014   HCT 34.6* 02/22/2014   MCV 86.8 02/22/2014   PLT 177 02/22/2014   NEUTROABS 17.1* 02/22/2014     RADIOGRAPHIC STUDIES: I have personally reviewed the radiology reports and agreed with  their findings. Echocardiogram showed normal ejection fraction 55-60%  ASSESSMENT: 38 year old woman with clinical T2N1, stage IIB, invasive ductal carcinoma, grade 2, ER positive, PR positive, HER-2/neu negative, Ki-67 82%.  1. Patient was started on neoadjuvant chemotherapy with Doxorubicin and cyclophosphamide on 02/11/2014.  PLAN:  Debbie Martinez is doing well today.  Her labs are stable.  The WBC is slightly elevated due to the Neulasta.  She tolerated chemotherapy relatively well. She will continue tot ake her nausea medication as needed for nausea.  She can continue to take Maalox if needed for indigestion.     She will return on Thursday, 02/25/14 for labs and chemotherapy.  I did request scheduling of the rest of her chemotherapy regimen.     The patient has a good understanding of the overall plan. she agrees with it. She will call with any problems that may develop before her next visit here.  I spent 25 minutes counseling the patient face to face. The total time spent in the appointment was 30 minutes and more than 50% was on counseling and review of test results   Minette Headland, [place] [phone]  02/22/2014 3:35 PM

## 2014-02-22 NOTE — Telephone Encounter (Signed)
per pof to sch pt appt-gave pt copy of sch-snt MW email to sch trmts-pt to get updated copy 11/5

## 2014-02-23 ENCOUNTER — Telehealth: Payer: Self-pay | Admitting: *Deleted

## 2014-02-23 NOTE — Telephone Encounter (Signed)
Per staff message and POF I have scheduled appts. Advised scheduler of appts. JMW  

## 2014-02-24 ENCOUNTER — Other Ambulatory Visit: Payer: Self-pay

## 2014-02-24 DIAGNOSIS — C50211 Malignant neoplasm of upper-inner quadrant of right female breast: Secondary | ICD-10-CM

## 2014-02-25 ENCOUNTER — Other Ambulatory Visit (HOSPITAL_BASED_OUTPATIENT_CLINIC_OR_DEPARTMENT_OTHER): Payer: Medicaid Other

## 2014-02-25 DIAGNOSIS — C50811 Malignant neoplasm of overlapping sites of right female breast: Secondary | ICD-10-CM

## 2014-02-25 DIAGNOSIS — C50211 Malignant neoplasm of upper-inner quadrant of right female breast: Secondary | ICD-10-CM

## 2014-02-25 LAB — COMPREHENSIVE METABOLIC PANEL (CC13)
ALK PHOS: 79 U/L (ref 40–150)
ALT: 16 U/L (ref 0–55)
ANION GAP: 7 meq/L (ref 3–11)
AST: 11 U/L (ref 5–34)
Albumin: 3.7 g/dL (ref 3.5–5.0)
BILIRUBIN TOTAL: 0.22 mg/dL (ref 0.20–1.20)
BUN: 8.1 mg/dL (ref 7.0–26.0)
CO2: 24 mEq/L (ref 22–29)
CREATININE: 0.7 mg/dL (ref 0.6–1.1)
Calcium: 9 mg/dL (ref 8.4–10.4)
Chloride: 107 mEq/L (ref 98–109)
Glucose: 130 mg/dl (ref 70–140)
Potassium: 3.9 mEq/L (ref 3.5–5.1)
Sodium: 138 mEq/L (ref 136–145)
Total Protein: 7.1 g/dL (ref 6.4–8.3)

## 2014-02-25 LAB — CBC WITH DIFFERENTIAL/PLATELET
BASO%: 0.4 % (ref 0.0–2.0)
Basophils Absolute: 0.1 10*3/uL (ref 0.0–0.1)
EOS%: 0.2 % (ref 0.0–7.0)
Eosinophils Absolute: 0 10*3/uL (ref 0.0–0.5)
HEMATOCRIT: 32.4 % — AB (ref 34.8–46.6)
HGB: 10.2 g/dL — ABNORMAL LOW (ref 11.6–15.9)
LYMPH%: 14.4 % (ref 14.0–49.7)
MCH: 27.7 pg (ref 25.1–34.0)
MCHC: 31.5 g/dL (ref 31.5–36.0)
MCV: 88 fL (ref 79.5–101.0)
MONO#: 0.9 10*3/uL (ref 0.1–0.9)
MONO%: 7 % (ref 0.0–14.0)
NEUT#: 9.8 10*3/uL — ABNORMAL HIGH (ref 1.5–6.5)
NEUT%: 78 % — AB (ref 38.4–76.8)
PLATELETS: 145 10*3/uL (ref 145–400)
RBC: 3.69 10*6/uL — AB (ref 3.70–5.45)
RDW: 13.3 % (ref 11.2–14.5)
WBC: 12.5 10*3/uL — AB (ref 3.9–10.3)
lymph#: 1.8 10*3/uL (ref 0.9–3.3)

## 2014-02-26 ENCOUNTER — Ambulatory Visit (HOSPITAL_BASED_OUTPATIENT_CLINIC_OR_DEPARTMENT_OTHER): Payer: Medicaid Other

## 2014-02-26 ENCOUNTER — Other Ambulatory Visit: Payer: Self-pay | Admitting: Oncology

## 2014-02-26 ENCOUNTER — Ambulatory Visit: Payer: Medicaid Other

## 2014-02-26 DIAGNOSIS — Z5111 Encounter for antineoplastic chemotherapy: Secondary | ICD-10-CM

## 2014-02-26 DIAGNOSIS — C50811 Malignant neoplasm of overlapping sites of right female breast: Secondary | ICD-10-CM

## 2014-02-26 DIAGNOSIS — C773 Secondary and unspecified malignant neoplasm of axilla and upper limb lymph nodes: Secondary | ICD-10-CM

## 2014-02-26 DIAGNOSIS — C50211 Malignant neoplasm of upper-inner quadrant of right female breast: Secondary | ICD-10-CM

## 2014-02-26 MED ORDER — PALONOSETRON HCL INJECTION 0.25 MG/5ML
INTRAVENOUS | Status: AC
  Filled 2014-02-26: qty 5

## 2014-02-26 MED ORDER — DEXAMETHASONE SODIUM PHOSPHATE 20 MG/5ML IJ SOLN
12.0000 mg | Freq: Once | INTRAMUSCULAR | Status: AC
  Administered 2014-02-26: 12 mg via INTRAVENOUS

## 2014-02-26 MED ORDER — SODIUM CHLORIDE 0.9 % IV SOLN
Freq: Once | INTRAVENOUS | Status: AC
  Administered 2014-02-26: 09:00:00 via INTRAVENOUS

## 2014-02-26 MED ORDER — DEXAMETHASONE SODIUM PHOSPHATE 20 MG/5ML IJ SOLN
INTRAMUSCULAR | Status: AC
  Filled 2014-02-26: qty 5

## 2014-02-26 MED ORDER — FOSAPREPITANT DIMEGLUMINE INJECTION 150 MG
150.0000 mg | Freq: Once | INTRAVENOUS | Status: AC
  Administered 2014-02-26: 150 mg via INTRAVENOUS
  Filled 2014-02-26: qty 5

## 2014-02-26 MED ORDER — HEPARIN SOD (PORK) LOCK FLUSH 100 UNIT/ML IV SOLN
500.0000 [IU] | Freq: Once | INTRAVENOUS | Status: AC | PRN
  Administered 2014-02-26: 500 [IU]
  Filled 2014-02-26: qty 5

## 2014-02-26 MED ORDER — DOXORUBICIN HCL CHEMO IV INJECTION 2 MG/ML
60.0000 mg/m2 | Freq: Once | INTRAVENOUS | Status: AC
Start: 2014-02-26 — End: 2014-02-26
  Administered 2014-02-26: 116 mg via INTRAVENOUS
  Filled 2014-02-26: qty 58

## 2014-02-26 MED ORDER — SODIUM CHLORIDE 0.9 % IJ SOLN
10.0000 mL | INTRAMUSCULAR | Status: DC | PRN
  Administered 2014-02-26: 10 mL
  Filled 2014-02-26: qty 10

## 2014-02-26 MED ORDER — SODIUM CHLORIDE 0.9 % IV SOLN
600.0000 mg/m2 | Freq: Once | INTRAVENOUS | Status: AC
  Administered 2014-02-26: 1160 mg via INTRAVENOUS
  Filled 2014-02-26: qty 58

## 2014-02-26 MED ORDER — PALONOSETRON HCL INJECTION 0.25 MG/5ML
0.2500 mg | Freq: Once | INTRAVENOUS | Status: AC
  Administered 2014-02-26: 0.25 mg via INTRAVENOUS

## 2014-02-26 NOTE — Patient Instructions (Signed)
[place] Discharge Instructions for Patients Receiving Chemotherapy  Today you received the following chemotherapy agents Adriamycin/Doxorubcin.   To help prevent nausea and vomiting after your treatment, we encourage you to take your nausea medication as directed.    If you develop nausea and vomiting that is not controlled by your nausea medication, call the clinic.   BELOW ARE SYMPTOMS THAT SHOULD BE REPORTED IMMEDIATELY:  *FEVER GREATER THAN 100.5 F  *CHILLS WITH OR WITHOUT FEVER  NAUSEA AND VOMITING THAT IS NOT CONTROLLED WITH YOUR NAUSEA MEDICATION  *UNUSUAL SHORTNESS OF BREATH  *UNUSUAL BRUISING OR BLEEDING  TENDERNESS IN MOUTH AND THROAT WITH OR WITHOUT PRESENCE OF ULCERS  *URINARY PROBLEMS  *BOWEL PROBLEMS  UNUSUAL RASH Items with * indicate a potential emergency and should be followed up as soon as possible.  Feel free to call the clinic you have any questions or concerns. The clinic phone number is [phone].

## 2014-02-27 ENCOUNTER — Ambulatory Visit (HOSPITAL_BASED_OUTPATIENT_CLINIC_OR_DEPARTMENT_OTHER): Payer: Medicaid Other

## 2014-02-27 DIAGNOSIS — C50211 Malignant neoplasm of upper-inner quadrant of right female breast: Secondary | ICD-10-CM

## 2014-02-27 DIAGNOSIS — Z5189 Encounter for other specified aftercare: Secondary | ICD-10-CM

## 2014-02-27 DIAGNOSIS — C50811 Malignant neoplasm of overlapping sites of right female breast: Secondary | ICD-10-CM

## 2014-02-27 MED ORDER — PEGFILGRASTIM INJECTION 6 MG/0.6ML ~~LOC~~
6.0000 mg | PREFILLED_SYRINGE | Freq: Once | SUBCUTANEOUS | Status: AC
  Administered 2014-02-27: 6 mg via SUBCUTANEOUS

## 2014-02-27 NOTE — Patient Instructions (Signed)
Pegfilgrastim injection What is this medicine? PEGFILGRASTIM (peg fil GRA stim) is a long-acting granulocyte colony-stimulating factor that stimulates the growth of neutrophils, a type of white blood cell important in the body's fight against infection. It is used to reduce the incidence of fever and infection in patients with certain types of cancer who are receiving chemotherapy that affects the bone marrow. This medicine may be used for other purposes; ask your health care provider or pharmacist if you have questions. COMMON BRAND NAME(S): Neulasta What should I tell my health care provider before I take this medicine? They need to know if you have any of these conditions: -latex allergy -ongoing radiation therapy -sickle cell disease -skin reactions to acrylic adhesives (On-Body Injector only) -an unusual or allergic reaction to pegfilgrastim, filgrastim, other medicines, foods, dyes, or preservatives -pregnant or trying to get pregnant -breast-feeding How should I use this medicine? This medicine is for injection under the skin. If you get this medicine at home, you will be taught how to prepare and give the pre-filled syringe or how to use the On-body Injector. Refer to the patient Instructions for Use for detailed instructions. Use exactly as directed. Take your medicine at regular intervals. Do not take your medicine more often than directed. It is important that you put your used needles and syringes in a special sharps container. Do not put them in a trash can. If you do not have a sharps container, call your pharmacist or healthcare provider to get one. Talk to your pediatrician regarding the use of this medicine in children. Special care may be needed. Overdosage: If you think you have taken too much of this medicine contact a poison control center or emergency room at once. NOTE: This medicine is only for you. Do not share this medicine with others. What if I miss a dose? It is  important not to miss your dose. Call your doctor or health care professional if you miss your dose. If you miss a dose due to an On-body Injector failure or leakage, a new dose should be administered as soon as possible using a single prefilled syringe for manual use. What may interact with this medicine? Interactions have not been studied. Give your health care provider a list of all the medicines, herbs, non-prescription drugs, or dietary supplements you use. Also tell them if you smoke, drink alcohol, or use illegal drugs. Some items may interact with your medicine. This list may not describe all possible interactions. Give your health care provider a list of all the medicines, herbs, non-prescription drugs, or dietary supplements you use. Also tell them if you smoke, drink alcohol, or use illegal drugs. Some items may interact with your medicine. What should I watch for while using this medicine? You may need blood work done while you are taking this medicine. If you are going to need a MRI, CT scan, or other procedure, tell your doctor that you are using this medicine (On-Body Injector only). What side effects may I notice from receiving this medicine? Side effects that you should report to your doctor or health care professional as soon as possible: -allergic reactions like skin rash, itching or hives, swelling of the face, lips, or tongue -dizziness -fever -pain, redness, or irritation at site where injected -pinpoint red spots on the skin -shortness of breath or breathing problems -stomach or side pain, or pain at the shoulder -swelling -tiredness -trouble passing urine Side effects that usually do not require medical attention (report to your doctor   or health care professional if they continue or are bothersome): -bone pain -muscle pain This list may not describe all possible side effects. Call your doctor for medical advice about side effects. You may report side effects to FDA at  1-800-FDA-1088. Where should I keep my medicine? Keep out of the reach of children. Store pre-filled syringes in a refrigerator between 2 and 8 degrees C (36 and 46 degrees F). Do not freeze. Keep in carton to protect from light. Throw away this medicine if it is left out of the refrigerator for more than 48 hours. Throw away any unused medicine after the expiration date. NOTE: This sheet is a summary. It may not cover all possible information. If you have questions about this medicine, talk to your doctor, pharmacist, or health care provider.  2015, Elsevier/Gold Standard. (2013-07-09 16:14:05)  

## 2014-03-03 ENCOUNTER — Other Ambulatory Visit: Payer: Self-pay | Admitting: *Deleted

## 2014-03-03 DIAGNOSIS — C50211 Malignant neoplasm of upper-inner quadrant of right female breast: Secondary | ICD-10-CM

## 2014-03-04 ENCOUNTER — Other Ambulatory Visit (HOSPITAL_BASED_OUTPATIENT_CLINIC_OR_DEPARTMENT_OTHER): Payer: Medicaid Other

## 2014-03-04 ENCOUNTER — Encounter: Payer: Self-pay | Admitting: Adult Health

## 2014-03-04 ENCOUNTER — Ambulatory Visit (HOSPITAL_BASED_OUTPATIENT_CLINIC_OR_DEPARTMENT_OTHER): Payer: Medicaid Other | Admitting: Adult Health

## 2014-03-04 VITALS — BP 136/82 | HR 100 | Temp 98.6°F | Resp 18 | Ht 67.0 in | Wt 174.0 lb

## 2014-03-04 DIAGNOSIS — Z170 Estrogen receptor positive status [ER+]: Secondary | ICD-10-CM

## 2014-03-04 DIAGNOSIS — C773 Secondary and unspecified malignant neoplasm of axilla and upper limb lymph nodes: Secondary | ICD-10-CM

## 2014-03-04 DIAGNOSIS — C50211 Malignant neoplasm of upper-inner quadrant of right female breast: Secondary | ICD-10-CM

## 2014-03-04 DIAGNOSIS — D6481 Anemia due to antineoplastic chemotherapy: Secondary | ICD-10-CM

## 2014-03-04 LAB — CBC WITH DIFFERENTIAL/PLATELET
BASO%: 0.8 % (ref 0.0–2.0)
BASOS ABS: 0 10*3/uL (ref 0.0–0.1)
EOS%: 0.4 % (ref 0.0–7.0)
Eosinophils Absolute: 0 10*3/uL (ref 0.0–0.5)
HEMATOCRIT: 31.9 % — AB (ref 34.8–46.6)
HGB: 10.3 g/dL — ABNORMAL LOW (ref 11.6–15.9)
LYMPH#: 0.7 10*3/uL — AB (ref 0.9–3.3)
LYMPH%: 14.2 % (ref 14.0–49.7)
MCH: 28 pg (ref 25.1–34.0)
MCHC: 32.3 g/dL (ref 31.5–36.0)
MCV: 86.7 fL (ref 79.5–101.0)
MONO#: 0.2 10*3/uL (ref 0.1–0.9)
MONO%: 3.5 % (ref 0.0–14.0)
NEUT#: 4.2 10*3/uL (ref 1.5–6.5)
NEUT%: 81.1 % — AB (ref 38.4–76.8)
Platelets: 237 10*3/uL (ref 145–400)
RBC: 3.68 10*6/uL — ABNORMAL LOW (ref 3.70–5.45)
RDW: 13.2 % (ref 11.2–14.5)
WBC: 5.2 10*3/uL (ref 3.9–10.3)

## 2014-03-04 LAB — COMPREHENSIVE METABOLIC PANEL (CC13)
ALT: 16 U/L (ref 0–55)
AST: 10 U/L (ref 5–34)
Albumin: 3.8 g/dL (ref 3.5–5.0)
Alkaline Phosphatase: 122 U/L (ref 40–150)
Anion Gap: 6 mEq/L (ref 3–11)
BUN: 10.2 mg/dL (ref 7.0–26.0)
CALCIUM: 9.4 mg/dL (ref 8.4–10.4)
CHLORIDE: 106 meq/L (ref 98–109)
CO2: 26 meq/L (ref 22–29)
Creatinine: 0.7 mg/dL (ref 0.6–1.1)
Glucose: 110 mg/dl (ref 70–140)
Potassium: 4.4 mEq/L (ref 3.5–5.1)
Sodium: 138 mEq/L (ref 136–145)
Total Bilirubin: 0.21 mg/dL (ref 0.20–1.20)
Total Protein: 7.3 g/dL (ref 6.4–8.3)

## 2014-03-04 NOTE — Progress Notes (Signed)
Patient Care Team: Eloise Levels, NP as PCP - General (Nurse Practitioner) Autumn Messing III, MD as Consulting Physician (General Surgery) Rulon Eisenmenger, MD as Consulting Physician (Hematology and Oncology) [name], MD as Attending Physician (Radiation Oncology)  DIAGNOSIS: Breast cancer of upper-inner quadrant of right female breast   Primary site: Breast (Right)   Staging method: AJCC 7th Edition   Clinical: Stage IIB (T2, N1, cM0)   Summary: Stage IIB (T2, N1, cM0)   Clinical comments: Staged at breast conference 01/20/14.   SUMMARY OF ONCOLOGIC HISTORY:   Breast cancer of upper-inner quadrant of right female breast   01/12/2014 Mammogram Right breast 2:00 position 1.6 x 1.9 cm and the 12:00 position 0.5 x 0.9 cm distance between the 2 was 4.5 cm, right axillary lymph node enlargement   01/12/2014 Initial Diagnosis All 3 biopsies were IDC with DCIS ER percent PR 7200% Ki-67 85% HER-2 negative: Right axillary lymph node also positive for metastatic cancer ER positive HER-2 negative Ki-67 80% grade 2   01/18/2014 Breast MRI Right breast 12:00 position 2.8 x 2 x 2.2 cm: 2:00 position 1.3 x 0.8 x 0.5 cm, right axilla multiple enlarged lymph nodes 1 cm in size, right retropectoral lymph node 0.6 cm   02/11/2014 -  Chemotherapy Doxorubicin and Cyclophosphamide given on day 1 of a 14 day cycle with Neulasta given on day 2 for granulocyte support.  A total of four cycles are planned.      CHIEF COMPLIANT: neoadjuvant chemotherapy  INTERVAL HISTORY: Debbie Martinez is a 38 year old American lady with right breast invasive ductal carcinoma here for follow up while receiving neoadjuvant chemotherapy with Doxorubicin and Cyclophosphamide. She is currently cycle 2 day 8 of chemotherapy.  She tolerated chemotherapy moderately well.  She did have some nausea and that typically resolves with anti-emetics.  She did vomit once on the fourth day.  Her hair also fell out.  She otherwise denies fevers,  chills, constipation, diarrhea, skin changes, or any other concerns.    REVIEW OF SYSTEMS:   A 10 point review of systems was conducted and is otherwise negative except for what is noted above.    Past Medical History  Diagnosis Date  . Hypertension   . Breast cancer 01/20/14    triple positive  . Anxiety     gets sweaty and short of breath  . Shortness of breath     'anxiety'  . GERD (gastroesophageal reflux disease)     does not take anything  . Gestational diabetes    Past Surgical History  Procedure Laterality Date  . Keloid excision Left     ear  . Portacath placement Left 01/26/2014    Procedure: INSERTION PORT-A-CATH;  Surgeon: Autumn Messing III, MD;  Location: [facility];  Service: General;  Laterality: Left;   History   Social History  . Marital Status: Single    Spouse Name: N/A    Number of Children: N/A  . Years of Education: N/A   Social History Main Topics  . Smoking status: Never Smoker   . Smokeless tobacco: None  . Alcohol Use: Yes     Comment: Rarely- maybe  every 6 months  . Drug Use: No  . Sexual Activity: None   Other Topics Concern  . None   Social History Narrative   Family History  Problem Relation Age of Onset  . Heart attack Father 48  . Lupus Maternal Aunt   . Prostate cancer Maternal Grandfather 9  .  Dementia Paternal Grandmother   . Leukemia Cousin 40    paternal first cousin     ALLERGIES:  is allergic to lisinopril.  MEDICATIONS:  Current Outpatient Prescriptions  Medication Sig Dispense Refill  . atenolol (TENORMIN) 50 MG tablet Take 50 mg by mouth daily.    Marland Kitchen dexamethasone (DECADRON) 4 MG tablet Take 2 tablets by mouth once a day on the day after chemotherapy and then take 2 tablets two times a day for 2 days. Take with food. 30 tablet 1  . ENSURE (ENSURE) Take 237 mLs by mouth.    . ferrous sulfate 325 (65 FE) MG tablet Take 325 mg by mouth daily with breakfast.    . lidocaine-prilocaine (EMLA) cream Apply 1 application  topically as needed. 30 g 0  . LORazepam (ATIVAN) 0.5 MG tablet Take 1 tablet (0.5 mg total) by mouth every 6 (six) hours as needed for anxiety. 30 tablet 0  . ondansetron (ZOFRAN) 8 MG tablet Take 1 tablet (8 mg total) by mouth 2 (two) times daily as needed. Start on the third day after chemotherapy. 30 tablet 1  . oxyCODONE-acetaminophen (ROXICET) 5-325 MG per tablet Take 1-2 tablets by mouth every 4 (four) hours as needed. 50 tablet 0  . prochlorperazine (COMPAZINE) 10 MG tablet Take 1 tablet (10 mg total) by mouth every 6 (six) hours as needed (Nausea or vomiting). 30 tablet 1  . UNABLE TO FIND Apply 1 each topically as needed. 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    PHYSICAL EXAMINATION: ECOG PERFORMANCE STATUS: 0 - Asymptomatic  Filed Vitals:   03/04/14 1013  BP: 136/82  Pulse: 100  Temp: 98.6 F (37 C)  Resp: 18   Filed Weights   03/04/14 1013  Weight: 174 lb (78.926 kg)    GENERAL: Patient is a well appearing female in no acute distress HEENT:  Sclerae anicteric.  Oropharynx clear and moist. No ulcerations or evidence of oropharyngeal candidiasis. Neck is supple.  NODES:  No cervical, supraclavicular, or axillary lymphadenopathy palpated.  BREAST EXAM:  Deferred. LUNGS:  Clear to auscultation bilaterally.  No wheezes or rhonchi. HEART:  Regular rate and rhythm. No murmur appreciated. ABDOMEN:  Soft, nontender.  Positive, normoactive bowel sounds. No organomegaly palpated. MSK:  No focal spinal tenderness to palpation. Full range of motion bilaterally in the upper extremities. EXTREMITIES:  No peripheral edema.   SKIN:  Clear with no obvious rashes or skin changes. No nail dyscrasia. NEURO:  Nonfocal. Well oriented.  Appropriate affect.    LABORATORY DATA:  I have reviewed the data as listed   Chemistry      Component Value Date/Time   NA 138 02/25/2014 0849   K 3.9  02/25/2014 0849   CO2 24 02/25/2014 0849   BUN 8.1 02/25/2014 0849   CREATININE 0.7 02/25/2014 0849      Component Value Date/Time   CALCIUM 9.0 02/25/2014 0849   ALKPHOS 79 02/25/2014 0849   AST 11 02/25/2014 0849   ALT 16 02/25/2014 0849   BILITOT 0.22 02/25/2014 0849       Lab Results  Component Value Date   WBC 5.2 03/04/2014   HGB 10.3* 03/04/2014   HCT 31.9* 03/04/2014   MCV 86.7 03/04/2014   PLT 237 03/04/2014   NEUTROABS 4.2 03/04/2014     RADIOGRAPHIC STUDIES: I have personally reviewed the radiology reports and agreed with their findings. Echocardiogram showed normal ejection  fraction 55-60%  ASSESSMENT: 38 year old woman with clinical T2N1, stage IIB, invasive ductal carcinoma, grade 2, ER positive, PR positive, HER-2/neu negative, Ki-67 82%.  1. Patient was started on neoadjuvant chemotherapy with Doxorubicin and cyclophosphamide on 02/11/2014.  PLAN:  Debbie Martinez is doing well today.  Her labs are stable.  She will continue her anti-emetic regimen for her chemotherapy induced nausea/vomiting.  I reviewed her lab work with her in detail.  She does have a mild chemotherapy induced anemia that we will monitor.    Debbie Martinez will return in one week for labs, evaluation, and cycle 3 of Doxorubicin and Cyclophosphamide.    The patient has a good understanding of the overall plan. she agrees with it. She will call with any problems that may develop before her next visit here.  I spent 25 minutes counseling the patient face to face. The total time spent in the appointment was 30 minutes and more than 50% was on counseling and review of test results   Minette Headland, [address] [phone] 03/04/2014 10:25 AM

## 2014-03-10 ENCOUNTER — Other Ambulatory Visit: Payer: Self-pay | Admitting: *Deleted

## 2014-03-10 DIAGNOSIS — C50211 Malignant neoplasm of upper-inner quadrant of right female breast: Secondary | ICD-10-CM

## 2014-03-11 ENCOUNTER — Ambulatory Visit (HOSPITAL_BASED_OUTPATIENT_CLINIC_OR_DEPARTMENT_OTHER): Payer: Medicaid Other | Admitting: Adult Health

## 2014-03-11 ENCOUNTER — Other Ambulatory Visit (HOSPITAL_BASED_OUTPATIENT_CLINIC_OR_DEPARTMENT_OTHER): Payer: Medicaid Other

## 2014-03-11 ENCOUNTER — Ambulatory Visit (HOSPITAL_BASED_OUTPATIENT_CLINIC_OR_DEPARTMENT_OTHER): Payer: Medicaid Other

## 2014-03-11 ENCOUNTER — Encounter: Payer: Self-pay | Admitting: Adult Health

## 2014-03-11 ENCOUNTER — Other Ambulatory Visit: Payer: Self-pay | Admitting: Hematology and Oncology

## 2014-03-11 VITALS — BP 124/80 | HR 73 | Temp 98.2°F | Resp 18 | Ht 67.0 in | Wt 176.3 lb

## 2014-03-11 DIAGNOSIS — D6481 Anemia due to antineoplastic chemotherapy: Secondary | ICD-10-CM

## 2014-03-11 DIAGNOSIS — C50211 Malignant neoplasm of upper-inner quadrant of right female breast: Secondary | ICD-10-CM

## 2014-03-11 DIAGNOSIS — Z5111 Encounter for antineoplastic chemotherapy: Secondary | ICD-10-CM

## 2014-03-11 DIAGNOSIS — C773 Secondary and unspecified malignant neoplasm of axilla and upper limb lymph nodes: Secondary | ICD-10-CM

## 2014-03-11 LAB — COMPREHENSIVE METABOLIC PANEL (CC13)
ALBUMIN: 3.7 g/dL (ref 3.5–5.0)
ALK PHOS: 99 U/L (ref 40–150)
ALT: 15 U/L (ref 0–55)
AST: 13 U/L (ref 5–34)
Anion Gap: 8 mEq/L (ref 3–11)
BUN: 9.4 mg/dL (ref 7.0–26.0)
CALCIUM: 9.1 mg/dL (ref 8.4–10.4)
CHLORIDE: 108 meq/L (ref 98–109)
CO2: 26 mEq/L (ref 22–29)
Creatinine: 0.7 mg/dL (ref 0.6–1.1)
Glucose: 149 mg/dl — ABNORMAL HIGH (ref 70–140)
POTASSIUM: 3.7 meq/L (ref 3.5–5.1)
SODIUM: 141 meq/L (ref 136–145)
TOTAL PROTEIN: 7.1 g/dL (ref 6.4–8.3)
Total Bilirubin: 0.25 mg/dL (ref 0.20–1.20)

## 2014-03-11 LAB — CBC WITH DIFFERENTIAL/PLATELET
BASO%: 0.3 % (ref 0.0–2.0)
Basophils Absolute: 0 10*3/uL (ref 0.0–0.1)
EOS%: 0.1 % (ref 0.0–7.0)
Eosinophils Absolute: 0 10*3/uL (ref 0.0–0.5)
HCT: 32.6 % — ABNORMAL LOW (ref 34.8–46.6)
HGB: 10.3 g/dL — ABNORMAL LOW (ref 11.6–15.9)
LYMPH%: 14.3 % (ref 14.0–49.7)
MCH: 27.8 pg (ref 25.1–34.0)
MCHC: 31.6 g/dL (ref 31.5–36.0)
MCV: 88.1 fL (ref 79.5–101.0)
MONO#: 0.7 10*3/uL (ref 0.1–0.9)
MONO%: 5.2 % (ref 0.0–14.0)
NEUT#: 10.3 10*3/uL — ABNORMAL HIGH (ref 1.5–6.5)
NEUT%: 80.1 % — ABNORMAL HIGH (ref 38.4–76.8)
Platelets: 147 10*3/uL (ref 145–400)
RBC: 3.7 10*6/uL (ref 3.70–5.45)
RDW: 14 % (ref 11.2–14.5)
WBC: 12.8 10*3/uL — ABNORMAL HIGH (ref 3.9–10.3)
lymph#: 1.8 10*3/uL (ref 0.9–3.3)

## 2014-03-11 MED ORDER — DEXAMETHASONE SODIUM PHOSPHATE 20 MG/5ML IJ SOLN
INTRAMUSCULAR | Status: AC
  Filled 2014-03-11: qty 5

## 2014-03-11 MED ORDER — SODIUM CHLORIDE 0.9 % IJ SOLN
10.0000 mL | INTRAMUSCULAR | Status: DC | PRN
  Administered 2014-03-11: 10 mL
  Filled 2014-03-11: qty 10

## 2014-03-11 MED ORDER — PALONOSETRON HCL INJECTION 0.25 MG/5ML
INTRAVENOUS | Status: AC
  Filled 2014-03-11: qty 5

## 2014-03-11 MED ORDER — DOXORUBICIN HCL CHEMO IV INJECTION 2 MG/ML
60.0000 mg/m2 | Freq: Once | INTRAVENOUS | Status: AC
  Administered 2014-03-11: 116 mg via INTRAVENOUS
  Filled 2014-03-11: qty 58

## 2014-03-11 MED ORDER — SODIUM CHLORIDE 0.9 % IV SOLN
150.0000 mg | Freq: Once | INTRAVENOUS | Status: AC
  Administered 2014-03-11: 150 mg via INTRAVENOUS
  Filled 2014-03-11: qty 5

## 2014-03-11 MED ORDER — PALONOSETRON HCL INJECTION 0.25 MG/5ML
0.2500 mg | Freq: Once | INTRAVENOUS | Status: AC
  Administered 2014-03-11: 0.25 mg via INTRAVENOUS

## 2014-03-11 MED ORDER — DEXAMETHASONE SODIUM PHOSPHATE 20 MG/5ML IJ SOLN
12.0000 mg | Freq: Once | INTRAMUSCULAR | Status: AC
  Administered 2014-03-11: 12 mg via INTRAVENOUS

## 2014-03-11 MED ORDER — SODIUM CHLORIDE 0.9 % IV SOLN
600.0000 mg/m2 | Freq: Once | INTRAVENOUS | Status: AC
  Administered 2014-03-11: 1160 mg via INTRAVENOUS
  Filled 2014-03-11: qty 58

## 2014-03-11 MED ORDER — HEPARIN SOD (PORK) LOCK FLUSH 100 UNIT/ML IV SOLN
500.0000 [IU] | Freq: Once | INTRAVENOUS | Status: AC | PRN
  Administered 2014-03-11: 500 [IU]
  Filled 2014-03-11: qty 5

## 2014-03-11 MED ORDER — SODIUM CHLORIDE 0.9 % IV SOLN
Freq: Once | INTRAVENOUS | Status: AC
  Administered 2014-03-11: 10:00:00 via INTRAVENOUS

## 2014-03-11 NOTE — Patient Instructions (Signed)
[facility] Discharge Instructions for Patients Receiving Chemotherapy  Today you received the following chemotherapy agents Adriamycin and Cytoxan.  To help prevent nausea and vomiting after your treatment, we encourage you to take your nausea medication.   If you develop nausea and vomiting that is not controlled by your nausea medication, call the clinic.   BELOW ARE SYMPTOMS THAT SHOULD BE REPORTED IMMEDIATELY:  *FEVER GREATER THAN 100.5 F  *CHILLS WITH OR WITHOUT FEVER  NAUSEA AND VOMITING THAT IS NOT CONTROLLED WITH YOUR NAUSEA MEDICATION  *UNUSUAL SHORTNESS OF BREATH  *UNUSUAL BRUISING OR BLEEDING  TENDERNESS IN MOUTH AND THROAT WITH OR WITHOUT PRESENCE OF ULCERS  *URINARY PROBLEMS  *BOWEL PROBLEMS  UNUSUAL RASH Items with * indicate a potential emergency and should be followed up as soon as possible.  Feel free to call the clinic you have any questions or concerns. The clinic phone number is [phone].    

## 2014-03-11 NOTE — Progress Notes (Signed)
Patient Care Team: Eloise Levels, NP as PCP - General (Nurse Practitioner) Autumn Messing III, MD as Consulting Physician (General Surgery) Rulon Eisenmenger, MD as Consulting Physician (Hematology and Oncology) [name], MD as Attending Physician (Radiation Oncology)  DIAGNOSIS: Breast cancer of upper-inner quadrant of right female breast   Primary site: Breast (Right)   Staging method: AJCC 7th Edition   Clinical: Stage IIB (T2, N1, cM0)   Summary: Stage IIB (T2, N1, cM0)   Clinical comments: Staged at breast conference 01/20/14.   SUMMARY OF ONCOLOGIC HISTORY:   Breast cancer of upper-inner quadrant of right female breast   01/12/2014 Mammogram Right breast 2:00 position 1.6 x 1.9 cm and the 12:00 position 0.5 x 0.9 cm distance between the 2 was 4.5 cm, right axillary lymph node enlargement   01/12/2014 Initial Diagnosis All 3 biopsies were IDC with DCIS ER percent PR 7200% Ki-67 85% HER-2 negative: Right axillary lymph node also positive for metastatic cancer ER positive HER-2 negative Ki-67 80% grade 2   01/18/2014 Breast MRI Right breast 12:00 position 2.8 x 2 x 2.2 cm: 2:00 position 1.3 x 0.8 x 0.5 cm, right axilla multiple enlarged lymph nodes 1 cm in size, right retropectoral lymph node 0.6 cm   02/11/2014 -  Chemotherapy Doxorubicin and Cyclophosphamide given on day 1 of a 14 day cycle with Neulasta given on day 2 for granulocyte support.  A total of four cycles are planned.      CHIEF COMPLIANT: neoadjuvant chemotherapy  INTERVAL HISTORY: Debbie Martinez is a 38 year old American lady with right breast invasive ductal carcinoma here for follow up while receiving neoadjuvant chemotherapy with Doxorubicin and Cyclophosphamide. She is currently cycle 3 day 1 of chemotherapy.  She is doing well today.  She denies fevers, chills, nausea, vomiting, constipation, diarrhea, numbness/tingling, skin changes, or any further concerns.    REVIEW OF SYSTEMS:   A 10 point review of systems was  conducted and is otherwise negative except for what is noted above.    Past Medical History  Diagnosis Date  . Hypertension   . Breast cancer 01/20/14    triple positive  . Anxiety     gets sweaty and short of breath  . Shortness of breath     'anxiety'  . GERD (gastroesophageal reflux disease)     does not take anything  . Gestational diabetes    Past Surgical History  Procedure Laterality Date  . Keloid excision Left     ear  . Portacath placement Left 01/26/2014    Procedure: INSERTION PORT-A-CATH;  Surgeon: Autumn Messing III, MD;  Location: [facility];  Service: General;  Laterality: Left;   History   Social History  . Marital Status: Single    Spouse Name: N/A    Number of Children: N/A  . Years of Education: N/A   Social History Main Topics  . Smoking status: Never Smoker   . Smokeless tobacco: None  . Alcohol Use: Yes     Comment: Rarely- maybe  every 6 months  . Drug Use: No  . Sexual Activity: None   Other Topics Concern  . None   Social History Narrative   Family History  Problem Relation Age of Onset  . Heart attack Father 29  . Lupus Maternal Aunt   . Prostate cancer Maternal Grandfather 61  . Dementia Paternal Grandmother   . Leukemia Cousin 40    paternal first cousin     ALLERGIES:  is allergic to lisinopril.  MEDICATIONS:  Current Outpatient Prescriptions  Medication Sig Dispense Refill  . atenolol (TENORMIN) 50 MG tablet Take 50 mg by mouth daily.    Marland Kitchen dexamethasone (DECADRON) 4 MG tablet Take 2 tablets by mouth once a day on the day after chemotherapy and then take 2 tablets two times a day for 2 days. Take with food. 30 tablet 1  . ENSURE (ENSURE) Take 237 mLs by mouth.    . ferrous sulfate 325 (65 FE) MG tablet Take 325 mg by mouth daily with breakfast.    . lidocaine-prilocaine (EMLA) cream Apply 1 application topically as needed. 30 g 0  . LORazepam (ATIVAN) 0.5 MG tablet Take 1 tablet (0.5 mg total) by mouth every 6 (six) hours as needed for  anxiety. 30 tablet 0  . ondansetron (ZOFRAN) 8 MG tablet Take 1 tablet (8 mg total) by mouth 2 (two) times daily as needed. Start on the third day after chemotherapy. 30 tablet 1  . oxyCODONE-acetaminophen (ROXICET) 5-325 MG per tablet Take 1-2 tablets by mouth every 4 (four) hours as needed. 50 tablet 0  . prochlorperazine (COMPAZINE) 10 MG tablet Take 1 tablet (10 mg total) by mouth every 6 (six) hours as needed (Nausea or vomiting). 30 tablet 1  . UNABLE TO FIND Apply 1 each topically as needed. 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    PHYSICAL EXAMINATION: ECOG PERFORMANCE STATUS: 0 - Asymptomatic  Filed Vitals:   03/11/14 0925  BP: 124/80  Pulse: 73  Temp: 98.2 F (36.8 C)  Resp: 18   Filed Weights   03/11/14 0925  Weight: 176 lb 4.8 oz (79.969 kg)    GENERAL: Patient is a well appearing female in no acute distress HEENT:  Sclerae anicteric.  Oropharynx clear and moist. No ulcerations or evidence of oropharyngeal candidiasis. Neck is supple.  NODES:  No cervical, supraclavicular, or axillary lymphadenopathy palpated.  BREAST EXAM:  Right upper inner quadrant mass softer LUNGS:  Clear to auscultation bilaterally.  No wheezes or rhonchi. HEART:  Regular rate and rhythm. No murmur appreciated. ABDOMEN:  Soft, nontender.  Positive, normoactive bowel sounds. No organomegaly palpated. MSK:  No focal spinal tenderness to palpation. Full range of motion bilaterally in the upper extremities. EXTREMITIES:  No peripheral edema.   SKIN:  Clear with no obvious rashes or skin changes. No nail dyscrasia. NEURO:  Nonfocal. Well oriented.  Appropriate affect.    LABORATORY DATA:  I have reviewed the data as listed   Chemistry      Component Value Date/Time   NA 138 03/04/2014 0959   K 4.4 03/04/2014 0959   CO2 26 03/04/2014 0959   BUN 10.2 03/04/2014 0959   CREATININE 0.7 03/04/2014  0959      Component Value Date/Time   CALCIUM 9.4 03/04/2014 0959   ALKPHOS 122 03/04/2014 0959   AST 10 03/04/2014 0959   ALT 16 03/04/2014 0959   BILITOT 0.21 03/04/2014 0959       Lab Results  Component Value Date   WBC 12.8* 03/11/2014   HGB 10.3* 03/11/2014   HCT 32.6* 03/11/2014   MCV 88.1 03/11/2014   PLT 147 03/11/2014   NEUTROABS 10.3* 03/11/2014     RADIOGRAPHIC STUDIES: I have personally reviewed the radiology reports and agreed with their findings. Echocardiogram showed normal ejection fraction 55-60%  ASSESSMENT: 38 year old woman with clinical T2N1, stage IIB, invasive ductal carcinoma, grade 2, ER  positive, PR positive, HER-2/neu negative, Ki-67 82%.  1. Patient was started on neoadjuvant chemotherapy with Doxorubicin and cyclophosphamide on 02/11/2014.  PLAN: Tyshana is feeling well today.  Her CBC is improved from last week.  She continues to have a chemotherapy induced anemia that we will continue to monitor.  She will proceed with her third cycle of Doxorubicin and Cyclophosphamide.  Her breast mass is improving and is softer.      Shawntina will return in 1 week for labs and f/u with Dr. Lindi Adie to assess for any chemotoxicities.    The patient has a good understanding of the overall plan. she agrees with it. She will call with any problems that may develop before her next visit here.  I spent 25 minutes counseling the patient face to face. The total time spent in the appointment was 30 minutes and more than 50% was on counseling and review of test results   Minette Headland, [place] [phone] 03/11/2014 9:41 AM

## 2014-03-12 ENCOUNTER — Ambulatory Visit (HOSPITAL_BASED_OUTPATIENT_CLINIC_OR_DEPARTMENT_OTHER): Payer: Medicaid Other

## 2014-03-12 DIAGNOSIS — Z5189 Encounter for other specified aftercare: Secondary | ICD-10-CM

## 2014-03-12 DIAGNOSIS — C50811 Malignant neoplasm of overlapping sites of right female breast: Secondary | ICD-10-CM

## 2014-03-12 DIAGNOSIS — C50211 Malignant neoplasm of upper-inner quadrant of right female breast: Secondary | ICD-10-CM

## 2014-03-12 MED ORDER — PEGFILGRASTIM INJECTION 6 MG/0.6ML ~~LOC~~
6.0000 mg | PREFILLED_SYRINGE | Freq: Once | SUBCUTANEOUS | Status: AC
  Administered 2014-03-12: 6 mg via SUBCUTANEOUS
  Filled 2014-03-12: qty 0.6

## 2014-03-12 NOTE — Patient Instructions (Signed)
Pegfilgrastim injection What is this medicine? PEGFILGRASTIM (peg fil GRA stim) is a long-acting granulocyte colony-stimulating factor that stimulates the growth of neutrophils, a type of white blood cell important in the body's fight against infection. It is used to reduce the incidence of fever and infection in patients with certain types of cancer who are receiving chemotherapy that affects the bone marrow. This medicine may be used for other purposes; ask your health care provider or pharmacist if you have questions. COMMON BRAND NAME(S): Neulasta What should I tell my health care provider before I take this medicine? They need to know if you have any of these conditions: -latex allergy -ongoing radiation therapy -sickle cell disease -skin reactions to acrylic adhesives (On-Body Injector only) -an unusual or allergic reaction to pegfilgrastim, filgrastim, other medicines, foods, dyes, or preservatives -pregnant or trying to get pregnant -breast-feeding How should I use this medicine? This medicine is for injection under the skin. If you get this medicine at home, you will be taught how to prepare and give the pre-filled syringe or how to use the On-body Injector. Refer to the patient Instructions for Use for detailed instructions. Use exactly as directed. Take your medicine at regular intervals. Do not take your medicine more often than directed. It is important that you put your used needles and syringes in a special sharps container. Do not put them in a trash can. If you do not have a sharps container, call your pharmacist or healthcare provider to get one. Talk to your pediatrician regarding the use of this medicine in children. Special care may be needed. Overdosage: If you think you have taken too much of this medicine contact a poison control center or emergency room at once. NOTE: This medicine is only for you. Do not share this medicine with others. What if I miss a dose? It is  important not to miss your dose. Call your doctor or health care professional if you miss your dose. If you miss a dose due to an On-body Injector failure or leakage, a new dose should be administered as soon as possible using a single prefilled syringe for manual use. What may interact with this medicine? Interactions have not been studied. Give your health care provider a list of all the medicines, herbs, non-prescription drugs, or dietary supplements you use. Also tell them if you smoke, drink alcohol, or use illegal drugs. Some items may interact with your medicine. This list may not describe all possible interactions. Give your health care provider a list of all the medicines, herbs, non-prescription drugs, or dietary supplements you use. Also tell them if you smoke, drink alcohol, or use illegal drugs. Some items may interact with your medicine. What should I watch for while using this medicine? You may need blood work done while you are taking this medicine. If you are going to need a MRI, CT scan, or other procedure, tell your doctor that you are using this medicine (On-Body Injector only). What side effects may I notice from receiving this medicine? Side effects that you should report to your doctor or health care professional as soon as possible: -allergic reactions like skin rash, itching or hives, swelling of the face, lips, or tongue -dizziness -fever -pain, redness, or irritation at site where injected -pinpoint red spots on the skin -shortness of breath or breathing problems -stomach or side pain, or pain at the shoulder -swelling -tiredness -trouble passing urine Side effects that usually do not require medical attention (report to your doctor   or health care professional if they continue or are bothersome): -bone pain -muscle pain This list may not describe all possible side effects. Call your doctor for medical advice about side effects. You may report side effects to FDA at  1-800-FDA-1088. Where should I keep my medicine? Keep out of the reach of children. Store pre-filled syringes in a refrigerator between 2 and 8 degrees C (36 and 46 degrees F). Do not freeze. Keep in carton to protect from light. Throw away this medicine if it is left out of the refrigerator for more than 48 hours. Throw away any unused medicine after the expiration date. NOTE: This sheet is a summary. It may not cover all possible information. If you have questions about this medicine, talk to your doctor, pharmacist, or health care provider.  2015, Elsevier/Gold Standard. (2013-07-09 16:14:05)  

## 2014-03-16 ENCOUNTER — Other Ambulatory Visit: Payer: Self-pay

## 2014-03-16 DIAGNOSIS — C50211 Malignant neoplasm of upper-inner quadrant of right female breast: Secondary | ICD-10-CM

## 2014-03-17 ENCOUNTER — Ambulatory Visit (HOSPITAL_BASED_OUTPATIENT_CLINIC_OR_DEPARTMENT_OTHER): Payer: Medicaid Other | Admitting: Hematology and Oncology

## 2014-03-17 ENCOUNTER — Other Ambulatory Visit (HOSPITAL_BASED_OUTPATIENT_CLINIC_OR_DEPARTMENT_OTHER): Payer: Medicaid Other

## 2014-03-17 ENCOUNTER — Telehealth: Payer: Self-pay | Admitting: Hematology and Oncology

## 2014-03-17 VITALS — BP 149/94 | HR 84 | Temp 98.3°F | Resp 20 | Ht 67.0 in | Wt 172.3 lb

## 2014-03-17 DIAGNOSIS — C50811 Malignant neoplasm of overlapping sites of right female breast: Secondary | ICD-10-CM

## 2014-03-17 DIAGNOSIS — C773 Secondary and unspecified malignant neoplasm of axilla and upper limb lymph nodes: Secondary | ICD-10-CM

## 2014-03-17 DIAGNOSIS — R112 Nausea with vomiting, unspecified: Secondary | ICD-10-CM

## 2014-03-17 DIAGNOSIS — C50211 Malignant neoplasm of upper-inner quadrant of right female breast: Secondary | ICD-10-CM

## 2014-03-17 DIAGNOSIS — D701 Agranulocytosis secondary to cancer chemotherapy: Secondary | ICD-10-CM

## 2014-03-17 DIAGNOSIS — R5383 Other fatigue: Secondary | ICD-10-CM

## 2014-03-17 DIAGNOSIS — D6481 Anemia due to antineoplastic chemotherapy: Secondary | ICD-10-CM

## 2014-03-17 DIAGNOSIS — F329 Major depressive disorder, single episode, unspecified: Secondary | ICD-10-CM

## 2014-03-17 DIAGNOSIS — Z170 Estrogen receptor positive status [ER+]: Secondary | ICD-10-CM

## 2014-03-17 LAB — CBC WITH DIFFERENTIAL/PLATELET
BASO%: 0.5 % (ref 0.0–2.0)
Basophils Absolute: 0 10e3/uL (ref 0.0–0.1)
EOS%: 0 % (ref 0.0–7.0)
Eosinophils Absolute: 0 10e3/uL (ref 0.0–0.5)
HCT: 31.4 % — ABNORMAL LOW (ref 34.8–46.6)
HGB: 10 g/dL — ABNORMAL LOW (ref 11.6–15.9)
LYMPH%: 9.5 % — ABNORMAL LOW (ref 14.0–49.7)
MCH: 28 pg (ref 25.1–34.0)
MCHC: 31.8 g/dL (ref 31.5–36.0)
MCV: 88 fL (ref 79.5–101.0)
MONO#: 0.1 10e3/uL (ref 0.1–0.9)
MONO%: 1.6 % (ref 0.0–14.0)
NEUT#: 5.4 10e3/uL (ref 1.5–6.5)
NEUT%: 88.4 % — ABNORMAL HIGH (ref 38.4–76.8)
Platelets: 199 10e3/uL (ref 145–400)
RBC: 3.57 10e6/uL — ABNORMAL LOW (ref 3.70–5.45)
RDW: 13.8 % (ref 11.2–14.5)
WBC: 6.1 10e3/uL (ref 3.9–10.3)
lymph#: 0.6 10e3/uL — ABNORMAL LOW (ref 0.9–3.3)

## 2014-03-17 LAB — COMPREHENSIVE METABOLIC PANEL (CC13)
ALK PHOS: 138 U/L (ref 40–150)
ALT: 11 U/L (ref 0–55)
AST: 9 U/L (ref 5–34)
Albumin: 3.7 g/dL (ref 3.5–5.0)
Anion Gap: 7 mEq/L (ref 3–11)
BILIRUBIN TOTAL: 0.24 mg/dL (ref 0.20–1.20)
BUN: 11 mg/dL (ref 7.0–26.0)
CO2: 26 mEq/L (ref 22–29)
CREATININE: 0.7 mg/dL (ref 0.6–1.1)
Calcium: 9.2 mg/dL (ref 8.4–10.4)
Chloride: 106 mEq/L (ref 98–109)
GLUCOSE: 133 mg/dL (ref 70–140)
Potassium: 3.8 mEq/L (ref 3.5–5.1)
Sodium: 139 mEq/L (ref 136–145)
Total Protein: 7.2 g/dL (ref 6.4–8.3)

## 2014-03-17 NOTE — Progress Notes (Signed)
Patient Care Team: Eloise Levels, NP as PCP - General (Nurse Practitioner) Autumn Messing III, MD as Consulting Physician (General Surgery) Rulon Eisenmenger, MD as Consulting Physician (Hematology and Oncology) [name], MD as Attending Physician (Radiation Oncology)  DIAGNOSIS: Breast cancer of upper-inner quadrant of right female breast   Staging form: Breast, AJCC 7th Edition     Clinical: Stage IIB (T2, N1, cM0) - Unsigned       Staging comments: Staged at breast conference 01/20/14.      Pathologic: No stage assigned - Unsigned   SUMMARY OF ONCOLOGIC HISTORY:   Breast cancer of upper-inner quadrant of right female breast   01/12/2014 Mammogram Right breast 2:00 position 1.6 x 1.9 cm and the 12:00 position 0.5 x 0.9 cm distance between the 2 was 4.5 cm, right axillary lymph node enlargement   01/12/2014 Initial Diagnosis All 3 biopsies were IDC with DCIS ER percent PR 7200% Ki-67 85% HER-2 negative: Right axillary lymph node also positive for metastatic cancer ER positive HER-2 negative Ki-67 80% grade 2   01/18/2014 Breast MRI Right breast 12:00 position 2.8 x 2 x 2.2 cm: 2:00 position 1.3 x 0.8 x 0.5 cm, right axilla multiple enlarged lymph nodes 1 cm in size, right retropectoral lymph node 0.6 cm   02/11/2014 -  Chemotherapy Doxorubicin and Cyclophosphamide X 4 followed by Taxol and carboplatin weekly x12    CHIEF COMPLIANT: nausea, fatigue: Today is cycle 3 day 8  INTERVAL HISTORY: Debbie Martinez is a 38 year old African American lady with above-mentioned history of right-sided breast cancer that is being treated with neoadjuvant chemotherapy with dose dense Adriamycin and Cytoxan. Today is cycle 3 day 8. After the last cycle she developed one episode of emesis on fourth day as well as bloating sensation. The antiemetics regimen has been helpful as it is preventing major problems. She had alopecia and her daughter age 14 has also handled it very well. Clinically the breast lump is  getting smaller.  REVIEW OF SYSTEMS:   Constitutional: Denies fevers, chills or abnormal weight loss; alopecia Eyes: Denies blurriness of vision Ears, nose, mouth, throat, and face: Denies mucositis or sore throat Respiratory: Denies cough, dyspnea or wheezes Cardiovascular: Denies palpitation, chest discomfort or lower extremity swelling Gastrointestinal:  Mild nausea emesis x1 Skin: Denies abnormal skin rashes Lymphatics: Denies new lymphadenopathy or easy bruising Neurological:Denies numbness, tingling or new weaknesses Behavioral/Psych: Mood is stable, no new changes  Breast: breast lump getting smaller All other systems were reviewed with the patient and are negative.  I have reviewed the past medical history, past surgical history, social history and family history with the patient and they are unchanged from previous note.  ALLERGIES:  is allergic to lisinopril.  MEDICATIONS:  Current Outpatient Prescriptions  Medication Sig Dispense Refill  . atenolol (TENORMIN) 50 MG tablet Take 50 mg by mouth daily.    Marland Kitchen dexamethasone (DECADRON) 4 MG tablet Take 2 tablets by mouth once a day on the day after chemotherapy and then take 2 tablets two times a day for 2 days. Take with food. 30 tablet 1  . ENSURE (ENSURE) Take 237 mLs by mouth.    . ferrous sulfate 325 (65 FE) MG tablet Take 325 mg by mouth daily with breakfast.    . lidocaine-prilocaine (EMLA) cream Apply 1 application topically as needed. 30 g 0  . LORazepam (ATIVAN) 0.5 MG tablet Take 1 tablet (0.5 mg total) by mouth every 6 (six) hours as needed for anxiety. 30 tablet  0  . ondansetron (ZOFRAN) 8 MG tablet Take 1 tablet (8 mg total) by mouth 2 (two) times daily as needed. Start on the third day after chemotherapy. 30 tablet 1  . oxyCODONE-acetaminophen (ROXICET) 5-325 MG per tablet Take 1-2 tablets by mouth every 4 (four) hours as needed. 50 tablet 0  . prochlorperazine (COMPAZINE) 10 MG tablet Take 1 tablet (10 mg total) by  mouth every 6 (six) hours as needed (Nausea or vomiting). 30 tablet 1  . UNABLE TO FIND Apply 1 each topically as needed. 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    PHYSICAL EXAMINATION: ECOG PERFORMANCE STATUS: 1 - Symptomatic but completely ambulatory  Filed Vitals:   03/17/14 0946  BP: 149/94  Pulse: 84  Temp: 98.3 F (36.8 C)  Resp: 20   Filed Weights   03/17/14 0946  Weight: 172 lb 4.8 oz (78.155 kg)    GENERAL:alert, no distress and comfortable; alopecia SKIN: skin color, texture, turgor are normal, no rashes or significant lesions EYES: normal, Conjunctiva are pink and non-injected, sclera clear OROPHARYNX:no exudate, no erythema and lips, buccal mucosa, and tongue normal  NECK: supple, thyroid normal size, non-tender, without nodularity LYMPH:  no palpable lymphadenopathy in the cervical, axillary or inguinal LUNGS: clear to auscultation and percussion with normal breathing effort HEART: regular rate & rhythm and no murmurs and no lower extremity edema ABDOMEN:abdomen soft, non-tender and normal bowel sounds Musculoskeletal:no cyanosis of digits and no clubbing  NEURO: alert & oriented x 3 with fluent speech, no focal motor/sensory deficits  LABORATORY DATA:  I have reviewed the data as listed   Chemistry      Component Value Date/Time   NA 141 03/11/2014 0905   K 3.7 03/11/2014 0905   CO2 26 03/11/2014 0905   BUN 9.4 03/11/2014 0905   CREATININE 0.7 03/11/2014 0905      Component Value Date/Time   CALCIUM 9.1 03/11/2014 0905   ALKPHOS 99 03/11/2014 0905   AST 13 03/11/2014 0905   ALT 15 03/11/2014 0905   BILITOT 0.25 03/11/2014 0905       Lab Results  Component Value Date   WBC 6.1 03/17/2014   HGB 10.0* 03/17/2014   HCT 31.4* 03/17/2014   MCV 88.0 03/17/2014   PLT 199 03/17/2014   NEUTROABS 5.4 03/17/2014    ASSESSMENT & PLAN:  Breast cancer of  upper-inner quadrant of right female breast 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  Current treatment: Neoadjuvant chemotherapy with dose dense Adriamycin Cytoxan. Today is cycle 3 day 8. Patient experienced the following toxicities of chemotherapy 1. Chemotherapy-induced anemia 2. Fatigue 3. Chemotherapy-induced neutropenia 4. Chemotherapy-induced nausea and vomiting: Much improved with Zofran 5. Sadness/depression  Clinical response: Breast lump is markedly smaller showing clinical response. Hence I do not believe she needs interim MRI. We will continue an administrator current treatment plan with 4 cycles of a.c. Followed by 12 weeks of Taxol and carboplatin. Monitoring her closely for toxicities. Since she is tolerating chemotherapy fairly well, we will see her in 1 week for cycle 4 of chemotherapy.  Once chemotherapy is completed, she may be a candidate for Alliance clinical trial for intraoperative randomization to axillary lymph node dissection versus no additional surgery. She may also be a candidate for NSABP B. 51 clinical trial between axillary radiation versus  not.   Orders Placed This Encounter  Procedures  . CBC with Differential    Standing Status: Future     Number of Occurrences:      Standing Expiration Date: 03/17/2015  . Comprehensive metabolic panel (Cmet) - [facility]    Standing Status: Future     Number of Occurrences:      Standing Expiration Date: 03/17/2015   The patient has a good understanding of the overall plan. she agrees with it. She will call with any problems that may develop before her next visit here.    Rulon Eisenmenger, MD 03/17/2014 10:01 AM

## 2014-03-17 NOTE — Assessment & Plan Note (Addendum)
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  Current treatment: Neoadjuvant chemotherapy with dose dense Adriamycin Cytoxan. Today is cycle 3 day 8. Patient experienced the following toxicities of chemotherapy 1. Chemotherapy-induced anemia 2. Fatigue 3. Chemotherapy-induced neutropenia 4. Chemotherapy-induced nausea and vomiting: Much improved with Zofran 5. Sadness/depression  Clinical response: Breast lump is markedly smaller showing clinical response. Hence I do not believe she needs interim MRI. We will continue an administrator current treatment plan with 4 cycles of a.c. Followed by 12 weeks of Taxol and carboplatin. Monitoring her closely for toxicities. Since she is tolerating chemotherapy fairly well, we will see her in 1 week for cycle 4 of chemotherapy.  Once chemotherapy is completed, she may be a candidate for Alliance clinical trial for intraoperative randomization to axillary lymph node dissection versus no additional surgery. She may also be a candidate for NSABP B. 51 clinical trial between axillary radiation versus not.

## 2014-03-25 ENCOUNTER — Ambulatory Visit (HOSPITAL_BASED_OUTPATIENT_CLINIC_OR_DEPARTMENT_OTHER): Payer: Medicaid Other | Admitting: Nurse Practitioner

## 2014-03-25 ENCOUNTER — Encounter: Payer: Self-pay | Admitting: Nurse Practitioner

## 2014-03-25 ENCOUNTER — Ambulatory Visit (HOSPITAL_BASED_OUTPATIENT_CLINIC_OR_DEPARTMENT_OTHER): Payer: Medicaid Other

## 2014-03-25 ENCOUNTER — Telehealth: Payer: Self-pay | Admitting: Nurse Practitioner

## 2014-03-25 ENCOUNTER — Telehealth: Payer: Self-pay | Admitting: *Deleted

## 2014-03-25 ENCOUNTER — Other Ambulatory Visit (HOSPITAL_BASED_OUTPATIENT_CLINIC_OR_DEPARTMENT_OTHER): Payer: Medicaid Other

## 2014-03-25 VITALS — BP 148/91 | HR 83 | Temp 97.7°F | Resp 19 | Ht 67.0 in | Wt 174.0 lb

## 2014-03-25 DIAGNOSIS — C50811 Malignant neoplasm of overlapping sites of right female breast: Secondary | ICD-10-CM

## 2014-03-25 DIAGNOSIS — K5900 Constipation, unspecified: Secondary | ICD-10-CM | POA: Insufficient documentation

## 2014-03-25 DIAGNOSIS — C50211 Malignant neoplasm of upper-inner quadrant of right female breast: Secondary | ICD-10-CM

## 2014-03-25 DIAGNOSIS — Z5111 Encounter for antineoplastic chemotherapy: Secondary | ICD-10-CM

## 2014-03-25 DIAGNOSIS — C773 Secondary and unspecified malignant neoplasm of axilla and upper limb lymph nodes: Secondary | ICD-10-CM

## 2014-03-25 DIAGNOSIS — R5383 Other fatigue: Secondary | ICD-10-CM

## 2014-03-25 DIAGNOSIS — D6481 Anemia due to antineoplastic chemotherapy: Secondary | ICD-10-CM

## 2014-03-25 DIAGNOSIS — R112 Nausea with vomiting, unspecified: Secondary | ICD-10-CM | POA: Insufficient documentation

## 2014-03-25 DIAGNOSIS — Z170 Estrogen receptor positive status [ER+]: Secondary | ICD-10-CM

## 2014-03-25 DIAGNOSIS — T451X5A Adverse effect of antineoplastic and immunosuppressive drugs, initial encounter: Secondary | ICD-10-CM | POA: Insufficient documentation

## 2014-03-25 LAB — COMPREHENSIVE METABOLIC PANEL (CC13)
ALT: 19 U/L (ref 0–55)
AST: 15 U/L (ref 5–34)
Albumin: 3.8 g/dL (ref 3.5–5.0)
Alkaline Phosphatase: 93 U/L (ref 40–150)
Anion Gap: 9 mEq/L (ref 3–11)
BUN: 7.4 mg/dL (ref 7.0–26.0)
CALCIUM: 9 mg/dL (ref 8.4–10.4)
CHLORIDE: 107 meq/L (ref 98–109)
CO2: 24 mEq/L (ref 22–29)
CREATININE: 0.7 mg/dL (ref 0.6–1.1)
EGFR: >90 mL/min/{1.73_m2} (ref >90)
Glucose: 110 mg/dl (ref 70–140)
POTASSIUM: 3.6 meq/L (ref 3.5–5.1)
Sodium: 140 mEq/L (ref 136–145)
Total Bilirubin: <0.2 mg/dL (ref 0.20–1.20)
Total Protein: 7.1 g/dL (ref 6.4–8.3)

## 2014-03-25 LAB — CBC WITH DIFFERENTIAL/PLATELET
BASO%: 0.3 % (ref 0.0–2.0)
Basophils Absolute: 0 10*3/uL (ref 0.0–0.1)
EOS ABS: 0 10*3/uL (ref 0.0–0.5)
EOS%: 0.1 % (ref 0.0–7.0)
HCT: 32.5 % — ABNORMAL LOW (ref 34.8–46.6)
HEMOGLOBIN: 10.3 g/dL — AB (ref 11.6–15.9)
LYMPH%: 10.2 % — AB (ref 14.0–49.7)
MCH: 27.7 pg (ref 25.1–34.0)
MCHC: 31.6 g/dL (ref 31.5–36.0)
MCV: 87.9 fL (ref 79.5–101.0)
MONO#: 1.1 10*3/uL — ABNORMAL HIGH (ref 0.1–0.9)
MONO%: 7.9 % (ref 0.0–14.0)
NEUT%: 81.5 % — ABNORMAL HIGH (ref 38.4–76.8)
NEUTROS ABS: 11.6 10*3/uL — AB (ref 1.5–6.5)
PLATELETS: 206 10*3/uL (ref 145–400)
RBC: 3.7 10*6/uL (ref 3.70–5.45)
RDW: 15.1 % — ABNORMAL HIGH (ref 11.2–14.5)
WBC: 14.3 10*3/uL — ABNORMAL HIGH (ref 3.9–10.3)
lymph#: 1.5 10*3/uL (ref 0.9–3.3)

## 2014-03-25 MED ORDER — DOXORUBICIN HCL CHEMO IV INJECTION 2 MG/ML
60.0000 mg/m2 | Freq: Once | INTRAVENOUS | Status: AC
  Administered 2014-03-25: 116 mg via INTRAVENOUS
  Filled 2014-03-25: qty 58

## 2014-03-25 MED ORDER — PALONOSETRON HCL INJECTION 0.25 MG/5ML
INTRAVENOUS | Status: AC
  Filled 2014-03-25: qty 5

## 2014-03-25 MED ORDER — DEXAMETHASONE SODIUM PHOSPHATE 20 MG/5ML IJ SOLN
12.0000 mg | Freq: Once | INTRAMUSCULAR | Status: AC
  Administered 2014-03-25: 12 mg via INTRAVENOUS

## 2014-03-25 MED ORDER — SODIUM CHLORIDE 0.9 % IV SOLN
150.0000 mg | Freq: Once | INTRAVENOUS | Status: AC
  Administered 2014-03-25: 150 mg via INTRAVENOUS
  Filled 2014-03-25: qty 5

## 2014-03-25 MED ORDER — HEPARIN SOD (PORK) LOCK FLUSH 100 UNIT/ML IV SOLN
500.0000 [IU] | Freq: Once | INTRAVENOUS | Status: AC | PRN
  Administered 2014-03-25: 500 [IU]
  Filled 2014-03-25: qty 5

## 2014-03-25 MED ORDER — SODIUM CHLORIDE 0.9 % IJ SOLN
10.0000 mL | INTRAMUSCULAR | Status: DC | PRN
  Administered 2014-03-25: 10 mL
  Filled 2014-03-25: qty 10

## 2014-03-25 MED ORDER — SODIUM CHLORIDE 0.9 % IV SOLN
Freq: Once | INTRAVENOUS | Status: AC
  Administered 2014-03-25: 10:00:00 via INTRAVENOUS

## 2014-03-25 MED ORDER — SODIUM CHLORIDE 0.9 % IV SOLN
600.0000 mg/m2 | Freq: Once | INTRAVENOUS | Status: AC
  Administered 2014-03-25: 1160 mg via INTRAVENOUS
  Filled 2014-03-25: qty 58

## 2014-03-25 MED ORDER — PALONOSETRON HCL INJECTION 0.25 MG/5ML
0.2500 mg | Freq: Once | INTRAVENOUS | Status: AC
Start: 2014-03-25 — End: 2014-03-25
  Administered 2014-03-25: 0.25 mg via INTRAVENOUS

## 2014-03-25 MED ORDER — DEXAMETHASONE SODIUM PHOSPHATE 20 MG/5ML IJ SOLN
INTRAMUSCULAR | Status: AC
  Filled 2014-03-25: qty 5

## 2014-03-25 NOTE — Assessment & Plan Note (Signed)
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  Current treatment: Neoadjuvant chemotherapy with dose dense Adriamycin Cytoxan. Today is cycle 4 day 1.  Overall Debbie Martinez is tolerating treatments exceptionally well. The labs were reviewed in detail and were entirely stable. She has some treatment related anemia with an hgb of 10.3, but she is asymptomatic at this time besides mild fatigue. I suggested she start stool softeners and miralax daily for her constipation. She will continue to utilize her anti-emetics for her nausea. She is staying well hydrated and does not vomit often.   Debbie Martinez will return next week for her nadir visit. At this time we will discuss her upcoming chemo regimen, paclitaxel weekly x12.

## 2014-03-25 NOTE — Patient Instructions (Signed)
[facility] Discharge Instructions for Patients Receiving Chemotherapy  Today you received the following chemotherapy agents:  Adriamycin and Cytoxan  To help prevent nausea and vomiting after your treatment, we encourage you to take your nausea medication as ordered per MD.   If you develop nausea and vomiting that is not controlled by your nausea medication, call the clinic.   BELOW ARE SYMPTOMS THAT SHOULD BE REPORTED IMMEDIATELY:  *FEVER GREATER THAN 100.5 F  *CHILLS WITH OR WITHOUT FEVER  NAUSEA AND VOMITING THAT IS NOT CONTROLLED WITH YOUR NAUSEA MEDICATION  *UNUSUAL SHORTNESS OF BREATH  *UNUSUAL BRUISING OR BLEEDING  TENDERNESS IN MOUTH AND THROAT WITH OR WITHOUT PRESENCE OF ULCERS  *URINARY PROBLEMS  *BOWEL PROBLEMS  UNUSUAL RASH Items with * indicate a potential emergency and should be followed up as soon as possible.  Feel free to call the clinic you have any questions or concerns. The clinic phone number is [phone].    

## 2014-03-25 NOTE — Telephone Encounter (Signed)
Advised scheduler that the appt for 12/17 is to late in the day for first time high risk drug

## 2014-03-25 NOTE — Telephone Encounter (Signed)
Per staff message and POF I have scheduled appts. Advised scheduler of appts. JMW  

## 2014-03-25 NOTE — Progress Notes (Addendum)
Patient Care Team: Eloise Levels, NP as PCP - General (Nurse Practitioner) Autumn Messing III, MD as Consulting Physician (General Surgery) Rulon Eisenmenger, MD as Consulting Physician (Hematology and Oncology) [name], MD as Attending Physician (Radiation Oncology)  DIAGNOSIS: Breast cancer of upper-inner quadrant of right female breast   Staging form: Breast, AJCC 7th Edition     Clinical: Stage IIB (T2, N1, cM0) - Unsigned       Staging comments: Staged at breast conference 01/20/14.      Pathologic: No stage assigned - Unsigned   SUMMARY OF ONCOLOGIC HISTORY:   Breast cancer of upper-inner quadrant of right female breast   01/12/2014 Mammogram Right breast 2:00 position 1.6 x 1.9 cm and the 12:00 position 0.5 x 0.9 cm distance between the 2 was 4.5 cm, right axillary lymph node enlargement   01/12/2014 Initial Diagnosis All 3 biopsies were IDC with DCIS ER percent PR 7200% Ki-67 85% HER-2 negative: Right axillary lymph node also positive for metastatic cancer ER positive HER-2 negative Ki-67 80% grade 2   01/18/2014 Breast MRI Right breast 12:00 position 2.8 x 2 x 2.2 cm: 2:00 position 1.3 x 0.8 x 0.5 cm, right axilla multiple enlarged lymph nodes 1 cm in size, right retropectoral lymph node 0.6 cm   02/11/2014 -  Chemotherapy Doxorubicin and Cyclophosphamide X 4 followed by Taxol and carboplatin weekly x12    CHIEF COMPLIANT: nausea, fatigue: Today is cycle 4 day 1  INTERVAL HISTORY: Debbie Martinez is a 38 year old African American lady with above-mentioned history of right-sided breast cancer that is being treated with neoadjuvant chemotherapy with dose dense Adriamycin and Cytoxan. Today is cycle 4 day 8. [name] is doing well today. She denies pain, fever, or chills. She states she experiences her worse nausea on day 4 through day 6 with minimal vomiting but the PRN anti-emetics are helpful. She has been constipated some and is remedying this by eating more fibrous fruits. Her  appetite is healthy despite acute taste changes after chemo. She denies mouth sores or rashes. She has no shortness of breath, chest pain, cough, or palpitations. She has some fatigue but this is manageable.   REVIEW OF SYSTEMS:   A detailed review of systems was conducted and was negative except where noted above.   I have reviewed the past medical history, past surgical history, social history and family history with the patient and they are unchanged from previous note.  ALLERGIES:  is allergic to lisinopril.  MEDICATIONS:  Current Outpatient Prescriptions  Medication Sig Dispense Refill  . atenolol (TENORMIN) 50 MG tablet Take 50 mg by mouth daily.    Marland Kitchen dexamethasone (DECADRON) 4 MG tablet Take 2 tablets by mouth once a day on the day after chemotherapy and then take 2 tablets two times a day for 2 days. Take with food. 30 tablet 1  . ENSURE (ENSURE) Take 237 mLs by mouth.    . ferrous sulfate 325 (65 FE) MG tablet Take 325 mg by mouth daily with breakfast.    . lidocaine-prilocaine (EMLA) cream Apply 1 application topically as needed. 30 g 0  . LORazepam (ATIVAN) 0.5 MG tablet Take 1 tablet (0.5 mg total) by mouth every 6 (six) hours as needed for anxiety. 30 tablet 0  . ondansetron (ZOFRAN) 8 MG tablet Take 1 tablet (8 mg total) by mouth 2 (two) times daily as needed. Start on the third day after chemotherapy. 30 tablet 1  . oxyCODONE-acetaminophen (ROXICET) 5-325 MG per tablet Take  1-2 tablets by mouth every 4 (four) hours as needed. 50 tablet 0  . prochlorperazine (COMPAZINE) 10 MG tablet Take 1 tablet (10 mg total) by mouth every 6 (six) hours as needed (Nausea or vomiting). 30 tablet 1  . UNABLE TO FIND Apply 1 each topically as needed. 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    PHYSICAL EXAMINATION: ECOG PERFORMANCE STATUS: 1 - Symptomatic  but completely ambulatory  Filed Vitals:   03/25/14 0846  BP: 148/91  Pulse: 83  Temp: 97.7 F (36.5 C)  Resp: 19   Filed Weights   03/25/14 0846  Weight: 174 lb (78.926 kg)    Skin: warm, dry  HEENT: sclerae anicteric, conjunctivae pink, oropharynx clear. No thrush or mucositis.  Lymph Nodes: No cervical or supraclavicular lymphadenopathy  Lungs: clear to auscultation bilaterally, no rales, wheezes, or rhonci  Heart: regular rate and rhythm  Abdomen: round, soft, non tender, positive bowel sounds  Musculoskeletal: No focal spinal tenderness, no peripheral edema  Neuro: non focal, well oriented, positive affect  Breasts: deferred  LABORATORY DATA:  I have reviewed the data as listed   Chemistry      Component Value Date/Time   NA 139 03/17/2014 0925   K 3.8 03/17/2014 0925   CO2 26 03/17/2014 0925   BUN 11.0 03/17/2014 0925   CREATININE 0.7 03/17/2014 0925      Component Value Date/Time   CALCIUM 9.2 03/17/2014 0925   ALKPHOS 138 03/17/2014 0925   AST 9 03/17/2014 0925   ALT 11 03/17/2014 0925   BILITOT 0.24 03/17/2014 0925       Lab Results  Component Value Date   WBC 14.3* 03/25/2014   HGB 10.3* 03/25/2014   HCT 32.5* 03/25/2014   MCV 87.9 03/25/2014   PLT 206 03/25/2014   NEUTROABS 11.6* 03/25/2014    ASSESSMENT & PLAN:  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  Current treatment: Neoadjuvant chemotherapy with dose dense Adriamycin Cytoxan. Today is cycle 4 day 1.  Overall [name] is tolerating treatments exceptionally well. The labs were reviewed in detail and were entirely stable. She has some treatment related anemia with an hgb of 10.3, but she is asymptomatic at this time besides mild fatigue. I suggested she start stool softeners and miralax daily for  her constipation. She will continue to utilize her anti-emetics for her nausea. She is staying well hydrated and does not vomit often.   [name] will return next week for her nadir visit. At this time we will discuss her upcoming chemo regimen, paclitaxel weekly x12.     Orders Placed This Encounter  Procedures  . CBC with Differential    Standing Status: Future     Number of Occurrences:      Standing Expiration Date: 03/25/2015  . Comprehensive metabolic panel    Standing Status: Future     Number of Occurrences:      Standing Expiration Date: 03/26/2015   The patient has a good understanding of the overall plan. she agrees with it. She will call with any problems that may develop before her next visit here.    Marcelino Duster, NP 03/25/2014 9:31 AM

## 2014-03-25 NOTE — Telephone Encounter (Signed)
per pof to sch pt appt-per VG ok to use 15 min on 12/17 @ 1:00-sent MW emailt o sch trmt

## 2014-03-26 ENCOUNTER — Telehealth: Payer: Self-pay | Admitting: Hematology and Oncology

## 2014-03-26 ENCOUNTER — Ambulatory Visit (HOSPITAL_BASED_OUTPATIENT_CLINIC_OR_DEPARTMENT_OTHER): Payer: Medicaid Other

## 2014-03-26 DIAGNOSIS — C50811 Malignant neoplasm of overlapping sites of right female breast: Secondary | ICD-10-CM

## 2014-03-26 DIAGNOSIS — Z5189 Encounter for other specified aftercare: Secondary | ICD-10-CM

## 2014-03-26 DIAGNOSIS — C50211 Malignant neoplasm of upper-inner quadrant of right female breast: Secondary | ICD-10-CM

## 2014-03-26 MED ORDER — PEGFILGRASTIM INJECTION 6 MG/0.6ML ~~LOC~~
6.0000 mg | PREFILLED_SYRINGE | Freq: Once | SUBCUTANEOUS | Status: AC
  Administered 2014-03-26: 6 mg via SUBCUTANEOUS
  Filled 2014-03-26: qty 0.6

## 2014-03-26 NOTE — Patient Instructions (Signed)
Pegfilgrastim injection What is this medicine? PEGFILGRASTIM (peg fil GRA stim) is a long-acting granulocyte colony-stimulating factor that stimulates the growth of neutrophils, a type of white blood cell important in the body's fight against infection. It is used to reduce the incidence of fever and infection in patients with certain types of cancer who are receiving chemotherapy that affects the bone marrow. This medicine may be used for other purposes; ask your health care provider or pharmacist if you have questions. COMMON BRAND NAME(S): Neulasta What should I tell my health care provider before I take this medicine? They need to know if you have any of these conditions: -latex allergy -ongoing radiation therapy -sickle cell disease -skin reactions to acrylic adhesives (On-Body Injector only) -an unusual or allergic reaction to pegfilgrastim, filgrastim, other medicines, foods, dyes, or preservatives -pregnant or trying to get pregnant -breast-feeding How should I use this medicine? This medicine is for injection under the skin. If you get this medicine at home, you will be taught how to prepare and give the pre-filled syringe or how to use the On-body Injector. Refer to the patient Instructions for Use for detailed instructions. Use exactly as directed. Take your medicine at regular intervals. Do not take your medicine more often than directed. It is important that you put your used needles and syringes in a special sharps container. Do not put them in a trash can. If you do not have a sharps container, call your pharmacist or healthcare provider to get one. Talk to your pediatrician regarding the use of this medicine in children. Special care may be needed. Overdosage: If you think you have taken too much of this medicine contact a poison control center or emergency room at once. NOTE: This medicine is only for you. Do not share this medicine with others. What if I miss a dose? It is  important not to miss your dose. Call your doctor or health care professional if you miss your dose. If you miss a dose due to an On-body Injector failure or leakage, a new dose should be administered as soon as possible using a single prefilled syringe for manual use. What may interact with this medicine? Interactions have not been studied. Give your health care provider a list of all the medicines, herbs, non-prescription drugs, or dietary supplements you use. Also tell them if you smoke, drink alcohol, or use illegal drugs. Some items may interact with your medicine. This list may not describe all possible interactions. Give your health care provider a list of all the medicines, herbs, non-prescription drugs, or dietary supplements you use. Also tell them if you smoke, drink alcohol, or use illegal drugs. Some items may interact with your medicine. What should I watch for while using this medicine? You may need blood work done while you are taking this medicine. If you are going to need a MRI, CT scan, or other procedure, tell your doctor that you are using this medicine (On-Body Injector only). What side effects may I notice from receiving this medicine? Side effects that you should report to your doctor or health care professional as soon as possible: -allergic reactions like skin rash, itching or hives, swelling of the face, lips, or tongue -dizziness -fever -pain, redness, or irritation at site where injected -pinpoint red spots on the skin -shortness of breath or breathing problems -stomach or side pain, or pain at the shoulder -swelling -tiredness -trouble passing urine Side effects that usually do not require medical attention (report to your doctor   or health care professional if they continue or are bothersome): -bone pain -muscle pain This list may not describe all possible side effects. Call your doctor for medical advice about side effects. You may report side effects to FDA at  1-800-FDA-1088. Where should I keep my medicine? Keep out of the reach of children. Store pre-filled syringes in a refrigerator between 2 and 8 degrees C (36 and 46 degrees F). Do not freeze. Keep in carton to protect from light. Throw away this medicine if it is left out of the refrigerator for more than 48 hours. Throw away any unused medicine after the expiration date. NOTE: This sheet is a summary. It may not cover all possible information. If you have questions about this medicine, talk to your doctor, pharmacist, or health care provider.  2015, Elsevier/Gold Standard. (2013-07-09 16:14:05)  

## 2014-03-26 NOTE — Telephone Encounter (Signed)
per pof to sch pt appt-cld & left message for pt appt times & dates

## 2014-03-31 ENCOUNTER — Ambulatory Visit (HOSPITAL_BASED_OUTPATIENT_CLINIC_OR_DEPARTMENT_OTHER): Payer: Medicaid Other | Admitting: Nurse Practitioner

## 2014-03-31 ENCOUNTER — Encounter: Payer: Self-pay | Admitting: Nurse Practitioner

## 2014-03-31 ENCOUNTER — Other Ambulatory Visit (HOSPITAL_BASED_OUTPATIENT_CLINIC_OR_DEPARTMENT_OTHER): Payer: Medicaid Other

## 2014-03-31 VITALS — BP 146/99 | HR 90 | Temp 98.5°F | Resp 19 | Ht 67.0 in | Wt 174.3 lb

## 2014-03-31 DIAGNOSIS — D6481 Anemia due to antineoplastic chemotherapy: Secondary | ICD-10-CM

## 2014-03-31 DIAGNOSIS — C50811 Malignant neoplasm of overlapping sites of right female breast: Secondary | ICD-10-CM

## 2014-03-31 DIAGNOSIS — C50211 Malignant neoplasm of upper-inner quadrant of right female breast: Secondary | ICD-10-CM

## 2014-03-31 DIAGNOSIS — R112 Nausea with vomiting, unspecified: Secondary | ICD-10-CM

## 2014-03-31 DIAGNOSIS — C773 Secondary and unspecified malignant neoplasm of axilla and upper limb lymph nodes: Secondary | ICD-10-CM

## 2014-03-31 DIAGNOSIS — T451X5A Adverse effect of antineoplastic and immunosuppressive drugs, initial encounter: Secondary | ICD-10-CM

## 2014-03-31 LAB — CBC WITH DIFFERENTIAL/PLATELET
BASO%: 0.1 % (ref 0.0–2.0)
BASOS ABS: 0 10*3/uL (ref 0.0–0.1)
EOS ABS: 0 10*3/uL (ref 0.0–0.5)
EOS%: 0.1 % (ref 0.0–7.0)
HEMATOCRIT: 31.2 % — AB (ref 34.8–46.6)
HGB: 9.9 g/dL — ABNORMAL LOW (ref 11.6–15.9)
LYMPH%: 6.8 % — ABNORMAL LOW (ref 14.0–49.7)
MCH: 28.3 pg (ref 25.1–34.0)
MCHC: 31.9 g/dL (ref 31.5–36.0)
MCV: 88.9 fL (ref 79.5–101.0)
MONO#: 0.2 10*3/uL (ref 0.1–0.9)
MONO%: 1.7 % (ref 0.0–14.0)
NEUT%: 91.3 % — ABNORMAL HIGH (ref 38.4–76.8)
NEUTROS ABS: 8.4 10*3/uL — AB (ref 1.5–6.5)
PLATELETS: 249 10*3/uL (ref 145–400)
RBC: 3.51 10*6/uL — ABNORMAL LOW (ref 3.70–5.45)
RDW: 14.9 % — ABNORMAL HIGH (ref 11.2–14.5)
WBC: 9.2 10*3/uL (ref 3.9–10.3)
lymph#: 0.6 10*3/uL — ABNORMAL LOW (ref 0.9–3.3)

## 2014-03-31 LAB — COMPREHENSIVE METABOLIC PANEL (CC13)
ALBUMIN: 3.8 g/dL (ref 3.5–5.0)
ALT: 12 U/L (ref 0–55)
AST: 11 U/L (ref 5–34)
Alkaline Phosphatase: 161 U/L — ABNORMAL HIGH (ref 40–150)
Anion Gap: 10 mEq/L (ref 3–11)
BUN: 9.9 mg/dL (ref 7.0–26.0)
CO2: 21 mEq/L — ABNORMAL LOW (ref 22–29)
Calcium: 9.1 mg/dL (ref 8.4–10.4)
Chloride: 106 mEq/L (ref 98–109)
Creatinine: 0.7 mg/dL (ref 0.6–1.1)
EGFR: >90 mL/min/{1.73_m2} (ref >90)
Glucose: 155 mg/dl — ABNORMAL HIGH (ref 70–140)
POTASSIUM: 3.9 meq/L (ref 3.5–5.1)
SODIUM: 138 meq/L (ref 136–145)
Total Bilirubin: 0.24 mg/dL (ref 0.20–1.20)
Total Protein: 7.1 g/dL (ref 6.4–8.3)

## 2014-03-31 NOTE — Progress Notes (Addendum)
Patient Care Team: Eloise Levels, NP as PCP - General (Nurse Practitioner) Autumn Messing III, MD as Consulting Physician (General Surgery) Rulon Eisenmenger, MD as Consulting Physician (Hematology and Oncology) Wyvonnia Lora, MD as Attending Physician (Radiation Oncology)  DIAGNOSIS: Breast cancer of upper-inner quadrant of right female breast   Staging form: Breast, AJCC 7th Edition     Clinical: Stage IIB (T2, N1, cM0) - Unsigned       Staging comments: Staged at breast conference 01/20/14.      Pathologic: No stage assigned - Unsigned   SUMMARY OF ONCOLOGIC HISTORY:   Breast cancer of upper-inner quadrant of right female breast   01/12/2014 Mammogram Right breast 2:00 position 1.6 x 1.9 cm and the 12:00 position 0.5 x 0.9 cm distance between the 2 was 4.5 cm, right axillary lymph node enlargement   01/12/2014 Initial Diagnosis All 3 biopsies were IDC with DCIS ER percent PR 7200% Ki-67 85% HER-2 negative: Right axillary lymph node also positive for metastatic cancer ER positive HER-2 negative Ki-67 80% grade 2   01/18/2014 Breast MRI Right breast 12:00 position 2.8 x 2 x 2.2 cm: 2:00 position 1.3 x 0.8 x 0.5 cm, right axilla multiple enlarged lymph nodes 1 cm in size, right retropectoral lymph node 0.6 cm   02/11/2014 -  Chemotherapy Doxorubicin and Cyclophosphamide X 4 followed by Taxol and carboplatin weekly x12    CHIEF COMPLIANT: nausea, fatigue: Today is cycle 4 day 8  INTERVAL HISTORY: Debbie Martinez is a 38 year old African American lady with above-mentioned history of right-sided breast cancer that is being treated with neoadjuvant chemotherapy with dose dense Adriamycin and Cytoxan. Lenna has performed well throughout this regimen of chemotherapy. She denies pain, fever, or chills. She vomited once after handling some raw meat at the grocery store. She believes her mind was on chemo and thus produced this "mental" nausea. Her nausea is typically the worst on days 4 and 5 but the PRN  anti-emetics are helpful. She eats fruit to remedy her constipation. Her appetite is fair though she can't taste anything. She denies mouth sores or rashes. Her energy level is low but she denies shortness of breath, chest pain, cough, or palpitations. She took 1 dose of percocet for lower back pain after the neulasta injection.  REVIEW OF SYSTEMS:   A detailed review of systems was conducted and was negative except where noted above.   I have reviewed the past medical history, past surgical history, social history and family history with the patient and they are unchanged from previous note.  ALLERGIES:  is allergic to lisinopril.  MEDICATIONS:  Current Outpatient Prescriptions  Medication Sig Dispense Refill  . atenolol (TENORMIN) 50 MG tablet Take 50 mg by mouth daily.    Marland Kitchen ENSURE (ENSURE) Take 237 mLs by mouth.    . ferrous sulfate 325 (65 FE) MG tablet Take 325 mg by mouth daily with breakfast.    . LORazepam (ATIVAN) 0.5 MG tablet Take 1 tablet (0.5 mg total) by mouth every 6 (six) hours as needed for anxiety. 30 tablet 0  . ondansetron (ZOFRAN) 8 MG tablet Take 1 tablet (8 mg total) by mouth 2 (two) times daily as needed. Start on the third day after chemotherapy. 30 tablet 1  . oxyCODONE-acetaminophen (ROXICET) 5-325 MG per tablet Take 1-2 tablets by mouth every 4 (four) hours as needed. 50 tablet 0  . UNABLE TO FIND Apply 1 each topically as needed. 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 dexamethasone (DECADRON) 4 MG tablet Take 2 tablets by mouth once a day on the day after chemotherapy and then take 2 tablets two times a day for 2 days. Take with food. (Patient not taking: Reported on 03/31/2014) 30 tablet 1  . lidocaine-prilocaine (EMLA) cream Apply 1 application topically as needed. (Patient not taking: Reported on 03/31/2014) 30 g 0  . prochlorperazine (COMPAZINE) 10 MG tablet Take 1 tablet (10 mg total) by mouth every 6 (six) hours as needed  (Nausea or vomiting). (Patient not taking: Reported on 03/31/2014) 30 tablet 1   No current facility-administered medications for this visit.    PHYSICAL EXAMINATION: ECOG PERFORMANCE STATUS: 1 - Symptomatic but completely ambulatory  Filed Vitals:   03/31/14 0840  BP: 146/99  Pulse: 90  Temp: 98.5 F (36.9 C)  Resp: 19   Filed Weights   03/31/14 0840  Weight: 174 lb 4.8 oz (79.062 kg)    Sclerae unicteric, pupils equal and reactive Oropharynx clear and moist-- no thrush No cervical or supraclavicular adenopathy Lungs no rales or rhonchi Heart regular rate and rhythm Abd soft, nontender, positive bowel sounds MSK no focal spinal tenderness, no upper extremity lymphedema Neuro: nonfocal, well oriented, appropriate affect Breasts: deferred  LABORATORY DATA:  I have reviewed the data as listed   Chemistry      Component Value Date/Time   NA 138 03/31/2014 0826   K 3.9 03/31/2014 0826   CO2 21* 03/31/2014 0826   BUN 9.9 03/31/2014 0826   CREATININE 0.7 03/31/2014 0826      Component Value Date/Time   CALCIUM 9.1 03/31/2014 0826   ALKPHOS 161* 03/31/2014 0826   AST 11 03/31/2014 0826   ALT 12 03/31/2014 0826   BILITOT 0.24 03/31/2014 0826       Lab Results  Component Value Date   WBC 9.2 03/31/2014   HGB 9.9* 03/31/2014   HCT 31.2* 03/31/2014   MCV 88.9 03/31/2014   PLT 249 03/31/2014   NEUTROABS 8.4* 03/31/2014    ASSESSMENT & PLAN:  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  Current treatment: Neoadjuvant chemotherapy with dose dense Adriamycin Cytoxan. Today is cycle 4 day 8. She is to begin taxol weekly x 12 starting next week. The labs were reviewed in detail and were stable. She continues to have treatment related anemia, but she is asymptomatic. We will  continue to monitor this value. She will continue to use or PRN anti-emetics to suppress her nausea.  Shanon and I spent some time today discussing her upcoming chemo regimen including common toxicities and side effects. She knows to watch for hypersensitivity reactions and peripheral neuropathy symptoms. She performed well with the [facility] and should continue to do well with the taxol. As noted in Dr. Geralyn Flash progress note 2 weeks ago, we are omitting the midpoint breast MRI as it is evident the tumor is shrinking with palpation.   Laveyah will return next week for labs, a follow up visit, and cycle 1 of taxol.     Orders Placed This Encounter  Procedures  . CBC with Differential    Standing Status: Future     Number of Occurrences:      Standing Expiration Date: 04/02/2015  . Comprehensive metabolic panel    Standing Status: Future     Number of  Occurrences:      Standing Expiration Date: 04/02/2015   The patient has a good understanding of the overall plan. she agrees with it. She will call with any problems that may develop before her next visit here.    Marcelino Duster, NP 04/02/2014 1:41 PM

## 2014-03-31 NOTE — Assessment & Plan Note (Addendum)
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  Current treatment: Neoadjuvant chemotherapy with dose dense Adriamycin Cytoxan. Today is cycle 4 day 8. She is to begin taxol weekly x 12 starting next week. The labs were reviewed in detail and were stable. She continues to have treatment related anemia, but she is asymptomatic. We will continue to monitor this value. She will continue to use or PRN anti-emetics to suppress her nausea.  Debbie Martinez and I spent some time today discussing her upcoming chemo regimen including common toxicities and side effects. She knows to watch for hypersensitivity reactions and peripheral neuropathy symptoms. She performed well with the [facility] and should continue to do well with the taxol. As noted in Dr. Geralyn Flash progress note 2 weeks ago, we are omitting the midpoint breast MRI as it is evident the tumor is shrinking with palpation.   Debbie Martinez will return next week for labs, a follow up visit, and cycle 1 of taxol.

## 2014-04-02 NOTE — Addendum Note (Signed)
Addended by: Marcelino Duster on: 04/02/2014 01:41 PM   Modules accepted: Orders

## 2014-04-07 ENCOUNTER — Ambulatory Visit (HOSPITAL_BASED_OUTPATIENT_CLINIC_OR_DEPARTMENT_OTHER): Payer: Medicaid Other | Admitting: Hematology and Oncology

## 2014-04-07 ENCOUNTER — Other Ambulatory Visit (HOSPITAL_BASED_OUTPATIENT_CLINIC_OR_DEPARTMENT_OTHER): Payer: Medicaid Other

## 2014-04-07 VITALS — BP 131/78 | HR 64 | Temp 98.2°F | Resp 18 | Ht 67.0 in | Wt 175.1 lb

## 2014-04-07 DIAGNOSIS — D6481 Anemia due to antineoplastic chemotherapy: Secondary | ICD-10-CM

## 2014-04-07 DIAGNOSIS — C50211 Malignant neoplasm of upper-inner quadrant of right female breast: Secondary | ICD-10-CM

## 2014-04-07 DIAGNOSIS — C773 Secondary and unspecified malignant neoplasm of axilla and upper limb lymph nodes: Secondary | ICD-10-CM

## 2014-04-07 DIAGNOSIS — Z170 Estrogen receptor positive status [ER+]: Secondary | ICD-10-CM

## 2014-04-07 LAB — CBC WITH DIFFERENTIAL/PLATELET
BASO%: 0.2 % (ref 0.0–2.0)
Basophils Absolute: 0 10*3/uL (ref 0.0–0.1)
EOS ABS: 0 10*3/uL (ref 0.0–0.5)
EOS%: 0.1 % (ref 0.0–7.0)
HEMATOCRIT: 32.5 % — AB (ref 34.8–46.6)
HGB: 10.1 g/dL — ABNORMAL LOW (ref 11.6–15.9)
LYMPH%: 10.4 % — AB (ref 14.0–49.7)
MCH: 27.6 pg (ref 25.1–34.0)
MCHC: 31.1 g/dL — AB (ref 31.5–36.0)
MCV: 88.7 fL (ref 79.5–101.0)
MONO#: 1.5 10*3/uL — ABNORMAL HIGH (ref 0.1–0.9)
MONO%: 11.3 % (ref 0.0–14.0)
NEUT#: 10.6 10*3/uL — ABNORMAL HIGH (ref 1.5–6.5)
NEUT%: 78 % — AB (ref 38.4–76.8)
PLATELETS: 194 10*3/uL (ref 145–400)
RBC: 3.66 10*6/uL — AB (ref 3.70–5.45)
RDW: 15.6 % — ABNORMAL HIGH (ref 11.2–14.5)
WBC: 13.5 10*3/uL — ABNORMAL HIGH (ref 3.9–10.3)
lymph#: 1.4 10*3/uL (ref 0.9–3.3)

## 2014-04-07 LAB — COMPREHENSIVE METABOLIC PANEL (CC13)
ALK PHOS: 109 U/L (ref 40–150)
ALT: 14 U/L (ref 0–55)
ANION GAP: 7 meq/L (ref 3–11)
AST: 12 U/L (ref 5–34)
Albumin: 3.9 g/dL (ref 3.5–5.0)
BILIRUBIN TOTAL: 0.2 mg/dL (ref 0.20–1.20)
BUN: 5.6 mg/dL — ABNORMAL LOW (ref 7.0–26.0)
CO2: 26 mEq/L (ref 22–29)
Calcium: 9 mg/dL (ref 8.4–10.4)
Chloride: 106 mEq/L (ref 98–109)
Creatinine: 0.7 mg/dL (ref 0.6–1.1)
Glucose: 100 mg/dl (ref 70–140)
Potassium: 4.1 mEq/L (ref 3.5–5.1)
SODIUM: 139 meq/L (ref 136–145)
TOTAL PROTEIN: 7.1 g/dL (ref 6.4–8.3)

## 2014-04-07 NOTE — Assessment & Plan Note (Signed)
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  Treatment: Patient completed neoadjuvant dose dense Adriamycin and Cytoxan. She will start weekly Taxol treatments from 04/08/2014. I discussed with her the toxicities of Taxol and she understands this and is willing to proceed.  Chemotherapy related toxicities: 1. Alopecia 2. Chemotherapy related anemia: We monitored  Return to clinic 1 week after starting chemotherapy for toxicity check.  

## 2014-04-07 NOTE — Progress Notes (Signed)
Patient Care Team: Debbie Levels, NP as PCP - General (Nurse Practitioner) Autumn Messing III, MD as Consulting Physician (General Surgery) Rulon Eisenmenger, MD as Consulting Physician (Hematology and Oncology) Wyvonnia Lora, MD as Attending Physician (Radiation Oncology)  DIAGNOSIS: Breast cancer of upper-inner quadrant of right female breast   Staging form: Breast, AJCC 7th Edition     Clinical: Stage IIB (T2, N1, cM0) - Unsigned       Staging comments: Staged at breast conference 01/20/14.      Pathologic: No stage assigned - Unsigned   SUMMARY OF ONCOLOGIC HISTORY:   Breast cancer of upper-inner quadrant of right female breast   01/12/2014 Mammogram Right breast 2:00 position 1.6 x 1.9 cm and the 12:00 position 0.5 x 0.9 cm distance between the 2 was 4.5 cm, right axillary lymph node enlargement   01/12/2014 Initial Diagnosis All 3 biopsies were IDC with DCIS ER percent PR 7200% Ki-67 85% HER-2 negative: Right axillary lymph node also positive for metastatic cancer ER positive HER-2 negative Ki-67 80% grade 2   01/18/2014 Breast MRI Right breast 12:00 position 2.8 x 2 x 2.2 cm: 2:00 position 1.3 x 0.8 x 0.5 cm, right axilla multiple enlarged lymph nodes 1 cm in size, right retropectoral lymph node 0.6 cm   02/11/2014 -  Chemotherapy Doxorubicin and Cyclophosphamide X 4 followed by Taxol and carboplatin weekly x12    CHIEF COMPLIANT: Patient to start neoadjuvant weekly Taxol and carboplatin treatments from 04/08/2014  INTERVAL HISTORY: Debbie Martinez is a 38 year old white female with above-mentioned history of right-sided breast cancer being treated with neoadjuvant chemotherapy. She completed 4 cycles of dose dense Adriamycin and Cytoxan. She will start weekly chemotherapy with Taxol and Carbo starting tomorrow. She is recovered very well from the effects of Adriamycin and Cytoxan. Denies any nausea or vomiting.  REVIEW OF SYSTEMS:   Constitutional: Denies fevers, chills or abnormal weight  loss Eyes: Denies blurriness of vision Ears, nose, mouth, throat, and face: Denies mucositis or sore throat Respiratory: Denies cough, dyspnea or wheezes Cardiovascular: Denies palpitation, chest discomfort or lower extremity swelling Gastrointestinal:  Denies nausea, heartburn or change in bowel habits Skin: Denies abnormal skin rashes Lymphatics: Denies new lymphadenopathy or easy bruising Neurological:Denies numbness, tingling or new weaknesses Behavioral/Psych: Mood is stable, no new changes  Breast:  denies any pain or lumps or nodules in either breasts All other systems were reviewed with the patient and are negative.  I have reviewed the past medical history, past surgical history, social history and family history with the patient and they are unchanged from previous note.  ALLERGIES:  is allergic to lisinopril.  MEDICATIONS:  Current Outpatient Prescriptions  Medication Sig Dispense Refill  . atenolol (TENORMIN) 50 MG tablet Take 50 mg by mouth daily.    Marland Kitchen ENSURE (ENSURE) Take 237 mLs by mouth.    . ferrous sulfate 325 (65 FE) MG tablet Take 325 mg by mouth daily with breakfast.    . LORazepam (ATIVAN) 0.5 MG tablet Take 1 tablet (0.5 mg total) by mouth every 6 (six) hours as needed for anxiety. 30 tablet 0  . oxyCODONE-acetaminophen (ROXICET) 5-325 MG per tablet Take 1-2 tablets by mouth every 4 (four) hours as needed. 50 tablet 0  . UNABLE TO FIND Apply 1 each topically as needed. 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    PHYSICAL EXAMINATION: ECOG PERFORMANCE STATUS: 1 - Symptomatic but  completely ambulatory  Filed Vitals:   04/07/14 1501  BP: 131/78  Pulse: 64  Temp: 98.2 F (36.8 C)  Resp: 18   Filed Weights   04/07/14 1501  Weight: 175 lb 1.6 oz (79.425 kg)    GENERAL:alert, no distress and comfortable SKIN: skin color, texture, turgor are normal, no rashes or  significant lesions EYES: normal, Conjunctiva are pink and non-injected, sclera clear OROPHARYNX:no exudate, no erythema and lips, buccal mucosa, and tongue normal  NECK: supple, thyroid normal size, non-tender, without nodularity LYMPH:  no palpable lymphadenopathy in the cervical, axillary or inguinal LUNGS: clear to auscultation and percussion with normal breathing effort HEART: regular rate & rhythm and no murmurs and no lower extremity edema ABDOMEN:abdomen soft, non-tender and normal bowel sounds Musculoskeletal:no cyanosis of digits and no clubbing  NEURO: alert & oriented x 3 with fluent speech, no focal motor/sensory deficits  LABORATORY DATA:  I have reviewed the data as listed   Chemistry      Component Value Date/Time   NA 139 04/07/2014 1318   K 4.1 04/07/2014 1318   CO2 26 04/07/2014 1318   BUN 5.6* 04/07/2014 1318   CREATININE 0.7 04/07/2014 1318      Component Value Date/Time   CALCIUM 9.0 04/07/2014 1318   ALKPHOS 109 04/07/2014 1318   AST 12 04/07/2014 1318   ALT 14 04/07/2014 1318   BILITOT 0.20 04/07/2014 1318       Lab Results  Component Value Date   WBC 13.5* 04/07/2014   HGB 10.1* 04/07/2014   HCT 32.5* 04/07/2014   MCV 88.7 04/07/2014   PLT 194 04/07/2014   NEUTROABS 10.6* 04/07/2014    ASSESSMENT & PLAN:  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  Treatment: Patient completed neoadjuvant dose dense Adriamycin and Cytoxan. She will start weekly Taxol treatments from 04/08/2014. I discussed with her the toxicities of Taxol and she understands this and is willing to proceed.  Chemotherapy related toxicities: 1. Alopecia 2. Chemotherapy related anemia: We monitored  Return to clinic 1 week after starting chemotherapy for toxicity check.   because the  cancer is shrinking clinically I will not get a midpoint MRI.  No orders of the defined types were placed in this encounter.   The patient has a good understanding of the overall plan. she agrees with it. She will call with any problems that may develop before her next visit here.   Rulon Eisenmenger, MD 04/07/2014 3:38 PM

## 2014-04-08 ENCOUNTER — Ambulatory Visit (HOSPITAL_BASED_OUTPATIENT_CLINIC_OR_DEPARTMENT_OTHER): Payer: Medicaid Other

## 2014-04-08 ENCOUNTER — Other Ambulatory Visit: Payer: BC Managed Care – PPO

## 2014-04-08 ENCOUNTER — Ambulatory Visit: Payer: BC Managed Care – PPO | Admitting: Hematology and Oncology

## 2014-04-08 DIAGNOSIS — Z5111 Encounter for antineoplastic chemotherapy: Secondary | ICD-10-CM

## 2014-04-08 DIAGNOSIS — C50211 Malignant neoplasm of upper-inner quadrant of right female breast: Secondary | ICD-10-CM

## 2014-04-08 MED ORDER — PACLITAXEL CHEMO INJECTION 300 MG/50ML
80.0000 mg/m2 | Freq: Once | INTRAVENOUS | Status: AC
  Administered 2014-04-08: 156 mg via INTRAVENOUS
  Filled 2014-04-08: qty 26

## 2014-04-08 MED ORDER — DIPHENHYDRAMINE HCL 50 MG/ML IJ SOLN
INTRAMUSCULAR | Status: AC
  Filled 2014-04-08: qty 1

## 2014-04-08 MED ORDER — SODIUM CHLORIDE 0.9 % IV SOLN
Freq: Once | INTRAVENOUS | Status: AC
  Administered 2014-04-08: 12:00:00 via INTRAVENOUS

## 2014-04-08 MED ORDER — DEXAMETHASONE SODIUM PHOSPHATE 20 MG/5ML IJ SOLN
INTRAMUSCULAR | Status: AC
Start: 2014-04-08 — End: 2014-04-08
  Filled 2014-04-08: qty 5

## 2014-04-08 MED ORDER — HEPARIN SOD (PORK) LOCK FLUSH 100 UNIT/ML IV SOLN
500.0000 [IU] | Freq: Once | INTRAVENOUS | Status: AC | PRN
  Administered 2014-04-08: 500 [IU]
  Filled 2014-04-08: qty 5

## 2014-04-08 MED ORDER — DIPHENHYDRAMINE HCL 50 MG/ML IJ SOLN
50.0000 mg | Freq: Once | INTRAMUSCULAR | Status: AC
  Administered 2014-04-08: 50 mg via INTRAVENOUS

## 2014-04-08 MED ORDER — SODIUM CHLORIDE 0.9 % IJ SOLN
10.0000 mL | INTRAMUSCULAR | Status: DC | PRN
  Administered 2014-04-08: 10 mL
  Filled 2014-04-08: qty 10

## 2014-04-08 MED ORDER — FAMOTIDINE IN NACL 20-0.9 MG/50ML-% IV SOLN
INTRAVENOUS | Status: AC
Start: 2014-04-08 — End: 2014-04-08
  Filled 2014-04-08: qty 50

## 2014-04-08 MED ORDER — FAMOTIDINE IN NACL 20-0.9 MG/50ML-% IV SOLN
20.0000 mg | Freq: Once | INTRAVENOUS | Status: AC
  Administered 2014-04-08: 20 mg via INTRAVENOUS

## 2014-04-08 MED ORDER — ONDANSETRON 16 MG/50ML IVPB (CHCC)
INTRAVENOUS | Status: AC
  Filled 2014-04-08: qty 16

## 2014-04-08 MED ORDER — ONDANSETRON 16 MG/50ML IVPB (CHCC)
16.0000 mg | Freq: Once | INTRAVENOUS | Status: AC
  Administered 2014-04-08: 16 mg via INTRAVENOUS

## 2014-04-08 MED ORDER — SODIUM CHLORIDE 0.9 % IV SOLN
291.4000 mg | Freq: Once | INTRAVENOUS | Status: DC
  Filled 2014-04-08: qty 29

## 2014-04-08 MED ORDER — DEXAMETHASONE SODIUM PHOSPHATE 20 MG/5ML IJ SOLN
20.0000 mg | Freq: Once | INTRAMUSCULAR | Status: AC
  Administered 2014-04-08: 20 mg via INTRAVENOUS

## 2014-04-08 NOTE — Patient Instructions (Signed)
[facility] Discharge Instructions for Patients Receiving Chemotherapy  Today you received the following chemotherapy agents Paclitaxel/Carboplatin.  To help prevent nausea and vomiting after your treatment, we encourage you to take your nausea medication as directed.    If you develop nausea and vomiting that is not controlled by your nausea medication, call the clinic.   BELOW ARE SYMPTOMS THAT SHOULD BE REPORTED IMMEDIATELY:  *FEVER GREATER THAN 100.5 F  *CHILLS WITH OR WITHOUT FEVER  NAUSEA AND VOMITING THAT IS NOT CONTROLLED WITH YOUR NAUSEA MEDICATION  *UNUSUAL SHORTNESS OF BREATH  *UNUSUAL BRUISING OR BLEEDING  TENDERNESS IN MOUTH AND THROAT WITH OR WITHOUT PRESENCE OF ULCERS  *URINARY PROBLEMS  *BOWEL PROBLEMS  UNUSUAL RASH Items with * indicate a potential emergency and should be followed up as soon as possible.  Feel free to call the clinic you have any questions or concerns. The clinic phone number is [phone].  Nanoparticle Albumin-Bound Paclitaxel injection What is this medicine? NANOPARTICLE ALBUMIN-BOUND PACLITAXEL (Na no PAHR ti kuhl al BYOO muhn-bound PAK li TAX el) is a chemotherapy drug. It targets fast dividing cells, like cancer cells, and causes these cells to die. This medicine is used to treat advanced breast cancer and advanced lung cancer. This medicine may be used for other purposes; ask your health care provider or pharmacist if you have questions. COMMON BRAND NAME(S): Abraxane What should I tell my health care provider before I take this medicine? They need to know if you have any of these conditions: -kidney disease -liver disease -low blood counts, like low platelets, red blood cells, or white blood cells -recent or ongoing radiation therapy -an unusual or allergic reaction to paclitaxel, albumin, other chemotherapy, other medicines, foods, dyes, or preservatives -pregnant or trying to get pregnant -breast-feeding How  should I use this medicine? This drug is given as an infusion into a vein. It is administered in a hospital or clinic by a specially trained health care professional. Talk to your pediatrician regarding the use of this medicine in children. Special care may be needed. Overdosage: If you think you have taken too much of this medicine contact a poison control center or emergency room at once. NOTE: This medicine is only for you. Do not share this medicine with others. What if I miss a dose? It is important not to miss your dose. Call your doctor or health care professional if you are unable to keep an appointment. What may interact with this medicine? -cyclosporine -diazepam -ketoconazole -medicines to increase blood counts like filgrastim, pegfilgrastim, sargramostim -other chemotherapy drugs like cisplatin, doxorubicin, epirubicin, etoposide, teniposide, vincristine -quinidine -testosterone -vaccines -verapamil Talk to your doctor or health care professional before taking any of these medicines: -acetaminophen -aspirin -ibuprofen -ketoprofen -naproxen This list may not describe all possible interactions. Give your health care provider a list of all the medicines, herbs, non-prescription drugs, or dietary supplements you use. Also tell them if you smoke, drink alcohol, or use illegal drugs. Some items may interact with your medicine. What should I watch for while using this medicine? Your condition will be monitored carefully while you are receiving this medicine. You will need important blood work done while you are taking this medicine. This drug may make you feel generally unwell. This is not uncommon, as chemotherapy can affect healthy cells as well as cancer cells. Report any side effects. Continue your course of treatment even though you feel ill unless your doctor tells you to stop. In some cases, you  may be given additional medicines to help with side effects. Follow all directions  for their use. Call your doctor or health care professional for advice if you get a fever, chills or sore throat, or other symptoms of a cold or flu. Do not treat yourself. This drug decreases your body's ability to fight infections. Try to avoid being around people who are sick. This medicine may increase your risk to bruise or bleed. Call your doctor or health care professional if you notice any unusual bleeding. Be careful brushing and flossing your teeth or using a toothpick because you may get an infection or bleed more easily. If you have any dental work done, tell your dentist you are receiving this medicine. Avoid taking products that contain aspirin, acetaminophen, ibuprofen, naproxen, or ketoprofen unless instructed by your doctor. These medicines may hide a fever. Do not become pregnant while taking this medicine. Women should inform their doctor if they wish to become pregnant or think they might be pregnant. There is a potential for serious side effects to an unborn child. Talk to your health care professional or pharmacist for more information. Do not breast-feed an infant while taking this medicine. Men are advised not to father a child while receiving this medicine. What side effects may I notice from receiving this medicine? Side effects that you should report to your doctor or health care professional as soon as possible: -allergic reactions like skin rash, itching or hives, swelling of the face, lips, or tongue -low blood counts - This drug may decrease the number of white blood cells, red blood cells and platelets. You may be at increased risk for infections and bleeding. -signs of infection - fever or chills, cough, sore throat, pain or difficulty passing urine -signs of decreased platelets or bleeding - bruising, pinpoint red spots on the skin, black, tarry stools, nosebleeds -signs of decreased red blood cells - unusually weak or tired, fainting spells, lightheadedness -breathing  problems -changes in vision -chest pain -high or low blood pressure -mouth sores -nausea and vomiting -pain, swelling, redness or irritation at the injection site -pain, tingling, numbness in the hands or feet -slow or irregular heartbeat -swelling of the ankle, feet, hands Side effects that usually do not require medical attention (report to your doctor or health care professional if they continue or are bothersome): -aches, pains -changes in the color of fingernails -diarrhea -hair loss -loss of appetite This list may not describe all possible side effects. Call your doctor for medical advice about side effects. You may report side effects to FDA at 1-800-FDA-1088. Where should I keep my medicine? This drug is given in a hospital or clinic and will not be stored at home. NOTE: This sheet is a summary. It may not cover all possible information. If you have questions about this medicine, talk to your doctor, pharmacist, or health care provider.  2015, [employer] Standard. (2012-06-02 16:48:50)

## 2014-04-09 ENCOUNTER — Other Ambulatory Visit: Payer: Self-pay | Admitting: *Deleted

## 2014-04-09 ENCOUNTER — Telehealth: Payer: Self-pay | Admitting: *Deleted

## 2014-04-09 NOTE — Telephone Encounter (Signed)
Patient reports she is "doing good" after her first taxol yesterday.  She has zofran and compazine if needed for nausea.  She is taking her decadron.  She did notice her cheeks were a little red this am but this has gone away now.  Let her know that this may be from the decadron she is taking.  She denies nausea, vomiting, diarrhea, constipation, mouth ulcers.   Let her know to call us if she has any problems this weekend.  She appreciated the phone call.

## 2014-04-09 NOTE — Telephone Encounter (Signed)
-----   Message from Ronnette Juniper, RN sent at 04/08/2014  3:12 PM EST ----- Regarding: 1st treatment 1st treatment: Paclitaxel. Dr. Lindi Adie.  [phone] Jerilynn Mages)

## 2014-04-14 ENCOUNTER — Other Ambulatory Visit: Payer: Self-pay | Admitting: *Deleted

## 2014-04-14 DIAGNOSIS — C50211 Malignant neoplasm of upper-inner quadrant of right female breast: Secondary | ICD-10-CM

## 2014-04-15 ENCOUNTER — Telehealth: Payer: Self-pay | Admitting: Hematology and Oncology

## 2014-04-15 ENCOUNTER — Other Ambulatory Visit (HOSPITAL_BASED_OUTPATIENT_CLINIC_OR_DEPARTMENT_OTHER): Payer: Medicaid Other

## 2014-04-15 ENCOUNTER — Ambulatory Visit (HOSPITAL_BASED_OUTPATIENT_CLINIC_OR_DEPARTMENT_OTHER): Payer: Medicaid Other

## 2014-04-15 ENCOUNTER — Ambulatory Visit (HOSPITAL_BASED_OUTPATIENT_CLINIC_OR_DEPARTMENT_OTHER): Payer: Medicaid Other | Admitting: Hematology and Oncology

## 2014-04-15 VITALS — BP 135/80 | HR 83 | Temp 98.2°F | Resp 20 | Ht 67.0 in | Wt 174.9 lb

## 2014-04-15 DIAGNOSIS — Z170 Estrogen receptor positive status [ER+]: Secondary | ICD-10-CM

## 2014-04-15 DIAGNOSIS — C50811 Malignant neoplasm of overlapping sites of right female breast: Secondary | ICD-10-CM

## 2014-04-15 DIAGNOSIS — Z5111 Encounter for antineoplastic chemotherapy: Secondary | ICD-10-CM

## 2014-04-15 DIAGNOSIS — D6481 Anemia due to antineoplastic chemotherapy: Secondary | ICD-10-CM

## 2014-04-15 DIAGNOSIS — C50211 Malignant neoplasm of upper-inner quadrant of right female breast: Secondary | ICD-10-CM

## 2014-04-15 DIAGNOSIS — C773 Secondary and unspecified malignant neoplasm of axilla and upper limb lymph nodes: Secondary | ICD-10-CM

## 2014-04-15 DIAGNOSIS — L659 Nonscarring hair loss, unspecified: Secondary | ICD-10-CM

## 2014-04-15 LAB — CBC WITH DIFFERENTIAL/PLATELET
BASO%: 0.7 % (ref 0.0–2.0)
BASOS ABS: 0 10*3/uL (ref 0.0–0.1)
EOS ABS: 0 10*3/uL (ref 0.0–0.5)
EOS%: 0.5 % (ref 0.0–7.0)
HEMATOCRIT: 32.4 % — AB (ref 34.8–46.6)
HGB: 10.3 g/dL — ABNORMAL LOW (ref 11.6–15.9)
LYMPH%: 24.5 % (ref 14.0–49.7)
MCH: 28.5 pg (ref 25.1–34.0)
MCHC: 31.8 g/dL (ref 31.5–36.0)
MCV: 89.8 fL (ref 79.5–101.0)
MONO#: 0.5 10*3/uL (ref 0.1–0.9)
MONO%: 10.6 % (ref 0.0–14.0)
NEUT#: 2.8 10*3/uL (ref 1.5–6.5)
NEUT%: 63.7 % (ref 38.4–76.8)
PLATELETS: 280 10*3/uL (ref 145–400)
RBC: 3.61 10*6/uL — ABNORMAL LOW (ref 3.70–5.45)
RDW: 15.3 % — ABNORMAL HIGH (ref 11.2–14.5)
WBC: 4.4 10*3/uL (ref 3.9–10.3)
lymph#: 1.1 10*3/uL (ref 0.9–3.3)

## 2014-04-15 LAB — COMPREHENSIVE METABOLIC PANEL (CC13)
ALT: 36 U/L (ref 0–55)
ANION GAP: 8 meq/L (ref 3–11)
AST: 24 U/L (ref 5–34)
Albumin: 3.8 g/dL (ref 3.5–5.0)
Alkaline Phosphatase: 68 U/L (ref 40–150)
BUN: 10.6 mg/dL (ref 7.0–26.0)
CO2: 26 meq/L (ref 22–29)
CREATININE: 0.7 mg/dL (ref 0.6–1.1)
Calcium: 9.2 mg/dL (ref 8.4–10.4)
Chloride: 109 mEq/L (ref 98–109)
EGFR: >90 mL/min/{1.73_m2} (ref >90)
GLUCOSE: 136 mg/dL (ref 70–140)
Potassium: 4.1 mEq/L (ref 3.5–5.1)
Sodium: 143 mEq/L (ref 136–145)
Total Protein: 7.3 g/dL (ref 6.4–8.3)

## 2014-04-15 MED ORDER — ONDANSETRON 16 MG/50ML IVPB (CHCC)
INTRAVENOUS | Status: AC
  Filled 2014-04-15: qty 16

## 2014-04-15 MED ORDER — DEXAMETHASONE SODIUM PHOSPHATE 20 MG/5ML IJ SOLN
INTRAMUSCULAR | Status: AC
  Filled 2014-04-15: qty 5

## 2014-04-15 MED ORDER — SODIUM CHLORIDE 0.9 % IV SOLN
Freq: Once | INTRAVENOUS | Status: AC
  Administered 2014-04-15: 10:00:00 via INTRAVENOUS

## 2014-04-15 MED ORDER — DIPHENHYDRAMINE HCL 50 MG/ML IJ SOLN
50.0000 mg | Freq: Once | INTRAMUSCULAR | Status: AC
  Administered 2014-04-15: 50 mg via INTRAVENOUS

## 2014-04-15 MED ORDER — ONDANSETRON 16 MG/50ML IVPB (CHCC)
16.0000 mg | Freq: Once | INTRAVENOUS | Status: AC
  Administered 2014-04-15: 16 mg via INTRAVENOUS

## 2014-04-15 MED ORDER — DEXAMETHASONE SODIUM PHOSPHATE 20 MG/5ML IJ SOLN
20.0000 mg | Freq: Once | INTRAMUSCULAR | Status: AC
Start: 2014-04-15 — End: 2014-04-15
  Administered 2014-04-15: 20 mg via INTRAVENOUS

## 2014-04-15 MED ORDER — SODIUM CHLORIDE 0.9 % IJ SOLN
10.0000 mL | INTRAMUSCULAR | Status: DC | PRN
  Administered 2014-04-15: 10 mL
  Filled 2014-04-15: qty 10

## 2014-04-15 MED ORDER — HEPARIN SOD (PORK) LOCK FLUSH 100 UNIT/ML IV SOLN
500.0000 [IU] | Freq: Once | INTRAVENOUS | Status: AC | PRN
  Administered 2014-04-15: 500 [IU]
  Filled 2014-04-15: qty 5

## 2014-04-15 MED ORDER — DEXTROSE 5 % IV SOLN
80.0000 mg/m2 | Freq: Once | INTRAVENOUS | Status: AC
  Administered 2014-04-15: 156 mg via INTRAVENOUS
  Filled 2014-04-15: qty 26

## 2014-04-15 MED ORDER — DIPHENHYDRAMINE HCL 50 MG/ML IJ SOLN
INTRAMUSCULAR | Status: AC
  Filled 2014-04-15: qty 1

## 2014-04-15 MED ORDER — FAMOTIDINE IN NACL 20-0.9 MG/50ML-% IV SOLN
20.0000 mg | Freq: Once | INTRAVENOUS | Status: AC
  Administered 2014-04-15: 20 mg via INTRAVENOUS

## 2014-04-15 MED ORDER — FAMOTIDINE IN NACL 20-0.9 MG/50ML-% IV SOLN
INTRAVENOUS | Status: AC
  Filled 2014-04-15: qty 50

## 2014-04-15 NOTE — Assessment & Plan Note (Addendum)
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   Current Treatment: Patient completed neoadjuvant dose dense Adriamycin and Cytoxan. Weekly Taxol treatment started 04/08/2014, today is week 2/12 of Taxol  Chemotherapy related toxicities: 1. Alopecia 2. Chemotherapy related anemia: Being monitored 3. Loss of taste 4. Nail bed discoloration Patient denies any nausea vomiting or neuropathy  Return to clinic 2 week but she will come weekly for Taxol treatments

## 2014-04-15 NOTE — Telephone Encounter (Signed)
per pof to sch pt appt-per VG ok to sch 8:30 1/7-gve pt copy of sch

## 2014-04-15 NOTE — Patient Instructions (Signed)
[facility] Discharge Instructions for Patients Receiving Chemotherapy  Today you received the following chemotherapy agents taxol  To help prevent nausea and vomiting after your treatment, we encourage you to take your nausea medication zofran starting about 5 pm if needed.   If you develop nausea and vomiting that is not controlled by your nausea medication, call the clinic.   BELOW ARE SYMPTOMS THAT SHOULD BE REPORTED IMMEDIATELY:  *FEVER GREATER THAN 100.5 F  *CHILLS WITH OR WITHOUT FEVER  NAUSEA AND VOMITING THAT IS NOT CONTROLLED WITH YOUR NAUSEA MEDICATION  *UNUSUAL SHORTNESS OF BREATH  *UNUSUAL BRUISING OR BLEEDING  TENDERNESS IN MOUTH AND THROAT WITH OR WITHOUT PRESENCE OF ULCERS  *URINARY PROBLEMS  *BOWEL PROBLEMS  UNUSUAL RASH Items with * indicate a potential emergency and should be followed up as soon as possible.  Feel free to call the clinic you have any questions or concerns. The clinic phone number is [phone].

## 2014-04-15 NOTE — Progress Notes (Signed)
Patient Care Team: Eloise Levels, NP as PCP - General (Nurse Practitioner) Autumn Messing III, MD as Consulting Physician (General Surgery) Rulon Eisenmenger, MD as Consulting Physician (Hematology and Oncology) [name], MD as Attending Physician (Radiation Oncology)  DIAGNOSIS: Breast cancer of upper-inner quadrant of right female breast   Staging form: Breast, AJCC 7th Edition     Clinical: Stage IIB (T2, N1, cM0) - Unsigned       Staging comments: Staged at breast conference 01/20/14.      Pathologic: No stage assigned - Unsigned   SUMMARY OF ONCOLOGIC HISTORY:   Breast cancer of upper-inner quadrant of right female breast   01/12/2014 Mammogram Right breast 2:00 position 1.6 x 1.9 cm and the 12:00 position 0.5 x 0.9 cm distance between the 2 was 4.5 cm, right axillary lymph node enlargement   01/12/2014 Initial Diagnosis All 3 biopsies were IDC with DCIS ER percent PR 7200% Ki-67 85% HER-2 negative: Right axillary lymph node also positive for metastatic cancer ER positive HER-2 negative Ki-67 80% grade 2   01/18/2014 Breast MRI Right breast 12:00 position 2.8 x 2 x 2.2 cm: 2:00 position 1.3 x 0.8 x 0.5 cm, right axilla multiple enlarged lymph nodes 1 cm in size, right retropectoral lymph node 0.6 cm   02/11/2014 -  Chemotherapy Doxorubicin and Cyclophosphamide X 4 followed by Taxol and carboplatin weekly x12    CHIEF COMPLIANT: Week 2 Taxol  INTERVAL HISTORY: Debbie Martinez is a 38 year old lady with above-mentioned history of right-sided breast cancer who is currently on neoadjuvant chemotherapy. She had completed dose dense Adriamycin and Cytoxan and is now 1 weekly Taxol treatments. Today is week 2. She tolerated week 1 Taxol extremely well without any major problems or concerns. She denies any nausea vomiting diarrhea or constipation. She denies any neuropathy symptoms.  REVIEW OF SYSTEMS:   Constitutional: Denies fevers, chills or abnormal weight loss Eyes: Denies blurriness of  vision Ears, nose, mouth, throat, and face: Denies mucositis or sore throat Respiratory: Denies cough, dyspnea or wheezes Cardiovascular: Denies palpitation, chest discomfort or lower extremity swelling Gastrointestinal:  Denies nausea, heartburn or change in bowel habits Skin: Denies abnormal skin rashes Lymphatics: Denies new lymphadenopathy or easy bruising Neurological:Denies numbness, tingling or new weaknesses Behavioral/Psych: Mood is stable, no new changes  All other systems were reviewed with the patient and are negative.  I have reviewed the past medical history, past surgical history, social history and family history with the patient and they are unchanged from previous note.  ALLERGIES:  is allergic to lisinopril.  MEDICATIONS:  Current Outpatient Prescriptions  Medication Sig Dispense Refill  . atenolol (TENORMIN) 50 MG tablet Take 50 mg by mouth daily.    Marland Kitchen ENSURE (ENSURE) Take 237 mLs by mouth.    . ferrous sulfate 325 (65 FE) MG tablet Take 325 mg by mouth daily with breakfast.    . LORazepam (ATIVAN) 0.5 MG tablet Take 1 tablet (0.5 mg total) by mouth every 6 (six) hours as needed for anxiety. 30 tablet 0  . oxyCODONE-acetaminophen (ROXICET) 5-325 MG per tablet Take 1-2 tablets by mouth every 4 (four) hours as needed. 50 tablet 0  . UNABLE TO FIND Apply 1 each topically as needed. 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    PHYSICAL EXAMINATION: ECOG PERFORMANCE STATUS: 1 - Symptomatic but completely ambulatory  Filed Vitals:   04/15/14 0942  BP: 135/80  Pulse:  83  Temp: 98.2 F (36.8 C)  Resp: 20   Filed Weights   04/15/14 0942  Weight: 174 lb 14.4 oz (79.334 kg)    GENERAL:alert, no distress and comfortable SKIN: skin color, texture, turgor are normal, no rashes or significant lesions EYES: normal, Conjunctiva are pink and non-injected, sclera  clear OROPHARYNX:no exudate, no erythema and lips, buccal mucosa, and tongue normal  NECK: supple, thyroid normal size, non-tender, without nodularity LYMPH:  no palpable lymphadenopathy in the cervical, axillary or inguinal LUNGS: clear to auscultation and percussion with normal breathing effort HEART: regular rate & rhythm and no murmurs and no lower extremity edema ABDOMEN:abdomen soft, non-tender and normal bowel sounds Musculoskeletal:no cyanosis of digits and no clubbing  NEURO: alert & oriented x 3 with fluent speech, no focal motor/sensory deficits LABORATORY DATA:  I have reviewed the data as listed   Chemistry      Component Value Date/Time   NA 143 04/15/2014 0906   K 4.1 04/15/2014 0906   CO2 26 04/15/2014 0906   BUN 10.6 04/15/2014 0906   CREATININE 0.7 04/15/2014 0906      Component Value Date/Time   CALCIUM 9.2 04/15/2014 0906   ALKPHOS 68 04/15/2014 0906   AST 24 04/15/2014 0906   ALT 36 04/15/2014 0906   BILITOT <0.20 04/15/2014 0906       Lab Results  Component Value Date   WBC 4.4 04/15/2014   HGB 10.3* 04/15/2014   HCT 32.4* 04/15/2014   MCV 89.8 04/15/2014   PLT 280 04/15/2014   NEUTROABS 2.8 04/15/2014   ASSESSMENT & PLAN:  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   Current Treatment: Patient completed neoadjuvant dose dense Adriamycin and Cytoxan. Weekly Taxol treatment started 04/08/2014, today is week 2/12 of Taxol  Chemotherapy related toxicities: 1. Alopecia 2. Chemotherapy related anemia: Being monitored 3. Loss of taste 4. Nail bed discoloration Patient denies any nausea vomiting or neuropathy  Return to clinic 2 week but she will come weekly for Taxol treatments   Orders Placed This Encounter  Procedures  . CBC with Differential     Standing Status: Future     Number of Occurrences:      Standing Expiration Date: 04/15/2015  . Comprehensive metabolic panel (Cmet) - [facility]    Standing Status: Future     Number of Occurrences:      Standing Expiration Date: 04/15/2015   The patient has a good understanding of the overall plan. she agrees with it. She will call with any problems that may develop before her next visit here.   Rulon Eisenmenger, MD 04/15/2014 10:03 AM

## 2014-04-22 ENCOUNTER — Other Ambulatory Visit (HOSPITAL_BASED_OUTPATIENT_CLINIC_OR_DEPARTMENT_OTHER): Payer: Medicaid Other

## 2014-04-22 ENCOUNTER — Ambulatory Visit (HOSPITAL_BASED_OUTPATIENT_CLINIC_OR_DEPARTMENT_OTHER): Payer: Medicaid Other

## 2014-04-22 DIAGNOSIS — C50811 Malignant neoplasm of overlapping sites of right female breast: Secondary | ICD-10-CM

## 2014-04-22 DIAGNOSIS — C50211 Malignant neoplasm of upper-inner quadrant of right female breast: Secondary | ICD-10-CM

## 2014-04-22 DIAGNOSIS — Z5111 Encounter for antineoplastic chemotherapy: Secondary | ICD-10-CM

## 2014-04-22 DIAGNOSIS — C773 Secondary and unspecified malignant neoplasm of axilla and upper limb lymph nodes: Secondary | ICD-10-CM

## 2014-04-22 LAB — CBC WITH DIFFERENTIAL/PLATELET
BASO%: 0.2 % (ref 0.0–2.0)
Basophils Absolute: 0 10*3/uL (ref 0.0–0.1)
EOS%: 1 % (ref 0.0–7.0)
Eosinophils Absolute: 0.1 10*3/uL (ref 0.0–0.5)
HEMATOCRIT: 34 % — AB (ref 34.8–46.6)
HEMOGLOBIN: 10.7 g/dL — AB (ref 11.6–15.9)
LYMPH%: 20.3 % (ref 14.0–49.7)
MCH: 28.1 pg (ref 25.1–34.0)
MCHC: 31.5 g/dL (ref 31.5–36.0)
MCV: 89.2 fL (ref 79.5–101.0)
MONO#: 0.4 10*3/uL (ref 0.1–0.9)
MONO%: 8.7 % (ref 0.0–14.0)
NEUT#: 3.4 10*3/uL (ref 1.5–6.5)
NEUT%: 69.8 % (ref 38.4–76.8)
Platelets: 357 10*3/uL (ref 145–400)
RBC: 3.81 10*6/uL (ref 3.70–5.45)
RDW: 15.1 % — AB (ref 11.2–14.5)
WBC: 4.8 10*3/uL (ref 3.9–10.3)
lymph#: 1 10*3/uL (ref 0.9–3.3)

## 2014-04-22 LAB — COMPREHENSIVE METABOLIC PANEL (CC13)
ALT: 43 U/L (ref 0–55)
AST: 27 U/L (ref 5–34)
Albumin: 4.1 g/dL (ref 3.5–5.0)
Alkaline Phosphatase: 69 U/L (ref 40–150)
Anion Gap: 8 mEq/L (ref 3–11)
BUN: 10.7 mg/dL (ref 7.0–26.0)
CO2: 28 mEq/L (ref 22–29)
Calcium: 9.6 mg/dL (ref 8.4–10.4)
Chloride: 104 mEq/L (ref 98–109)
Creatinine: 0.7 mg/dL (ref 0.6–1.1)
EGFR: >90 mL/min/{1.73_m2} (ref >90)
Glucose: 110 mg/dl (ref 70–140)
Potassium: 3.8 mEq/L (ref 3.5–5.1)
Sodium: 140 mEq/L (ref 136–145)
Total Bilirubin: 0.5 mg/dL (ref 0.20–1.20)
Total Protein: 7.6 g/dL (ref 6.4–8.3)

## 2014-04-22 MED ORDER — ONDANSETRON 16 MG/50ML IVPB (CHCC)
INTRAVENOUS | Status: AC
  Filled 2014-04-22: qty 16

## 2014-04-22 MED ORDER — HEPARIN SOD (PORK) LOCK FLUSH 100 UNIT/ML IV SOLN
500.0000 [IU] | Freq: Once | INTRAVENOUS | Status: AC | PRN
  Administered 2014-04-22: 500 [IU]
  Filled 2014-04-22: qty 5

## 2014-04-22 MED ORDER — DEXAMETHASONE SODIUM PHOSPHATE 20 MG/5ML IJ SOLN
INTRAMUSCULAR | Status: AC
  Filled 2014-04-22: qty 5

## 2014-04-22 MED ORDER — DEXTROSE 5 % IV SOLN
80.0000 mg/m2 | Freq: Once | INTRAVENOUS | Status: AC
  Administered 2014-04-22: 156 mg via INTRAVENOUS
  Filled 2014-04-22: qty 26

## 2014-04-22 MED ORDER — SODIUM CHLORIDE 0.9 % IV SOLN
Freq: Once | INTRAVENOUS | Status: AC
  Administered 2014-04-22: 10:00:00 via INTRAVENOUS

## 2014-04-22 MED ORDER — DIPHENHYDRAMINE HCL 50 MG/ML IJ SOLN
50.0000 mg | Freq: Once | INTRAMUSCULAR | Status: AC
  Administered 2014-04-22: 50 mg via INTRAVENOUS

## 2014-04-22 MED ORDER — ONDANSETRON 16 MG/50ML IVPB (CHCC)
16.0000 mg | Freq: Once | INTRAVENOUS | Status: AC
  Administered 2014-04-22: 16 mg via INTRAVENOUS

## 2014-04-22 MED ORDER — SODIUM CHLORIDE 0.9 % IJ SOLN
10.0000 mL | INTRAMUSCULAR | Status: DC | PRN
  Administered 2014-04-22: 10 mL
  Filled 2014-04-22: qty 10

## 2014-04-22 MED ORDER — FAMOTIDINE IN NACL 20-0.9 MG/50ML-% IV SOLN
20.0000 mg | Freq: Once | INTRAVENOUS | Status: AC
  Administered 2014-04-22: 20 mg via INTRAVENOUS

## 2014-04-22 MED ORDER — FAMOTIDINE IN NACL 20-0.9 MG/50ML-% IV SOLN
INTRAVENOUS | Status: AC
  Filled 2014-04-22: qty 50

## 2014-04-22 MED ORDER — DIPHENHYDRAMINE HCL 50 MG/ML IJ SOLN
INTRAMUSCULAR | Status: AC
  Filled 2014-04-22: qty 1

## 2014-04-22 MED ORDER — DEXAMETHASONE SODIUM PHOSPHATE 20 MG/5ML IJ SOLN
20.0000 mg | Freq: Once | INTRAMUSCULAR | Status: AC
  Administered 2014-04-22: 20 mg via INTRAVENOUS

## 2014-04-22 NOTE — Patient Instructions (Signed)
[facility] Discharge Instructions for Patients Receiving Chemotherapy  Today you received the following chemotherapy agents: Taxol.  To help prevent nausea and vomiting after your treatment, we encourage you to take your nausea medication as prescribed.   If you develop nausea and vomiting that is not controlled by your nausea medication, call the clinic.   BELOW ARE SYMPTOMS THAT SHOULD BE REPORTED IMMEDIATELY:  *FEVER GREATER THAN 100.5 F  *CHILLS WITH OR WITHOUT FEVER  NAUSEA AND VOMITING THAT IS NOT CONTROLLED WITH YOUR NAUSEA MEDICATION  *UNUSUAL SHORTNESS OF BREATH  *UNUSUAL BRUISING OR BLEEDING  TENDERNESS IN MOUTH AND THROAT WITH OR WITHOUT PRESENCE OF ULCERS  *URINARY PROBLEMS  *BOWEL PROBLEMS  UNUSUAL RASH Items with * indicate a potential emergency and should be followed up as soon as possible.  Feel free to call the clinic you have any questions or concerns. The clinic phone number is [phone].    

## 2014-04-28 ENCOUNTER — Other Ambulatory Visit: Payer: Self-pay | Admitting: *Deleted

## 2014-04-28 DIAGNOSIS — C50211 Malignant neoplasm of upper-inner quadrant of right female breast: Secondary | ICD-10-CM

## 2014-04-29 ENCOUNTER — Other Ambulatory Visit: Payer: BC Managed Care – PPO

## 2014-04-29 ENCOUNTER — Ambulatory Visit (HOSPITAL_BASED_OUTPATIENT_CLINIC_OR_DEPARTMENT_OTHER): Payer: Medicaid Other

## 2014-04-29 ENCOUNTER — Ambulatory Visit (HOSPITAL_BASED_OUTPATIENT_CLINIC_OR_DEPARTMENT_OTHER): Payer: Medicaid Other | Admitting: Hematology and Oncology

## 2014-04-29 ENCOUNTER — Other Ambulatory Visit (HOSPITAL_BASED_OUTPATIENT_CLINIC_OR_DEPARTMENT_OTHER): Payer: Medicaid Other

## 2014-04-29 VITALS — BP 134/97 | HR 76 | Temp 97.7°F | Resp 18 | Ht 67.0 in | Wt 175.0 lb

## 2014-04-29 DIAGNOSIS — Z5111 Encounter for antineoplastic chemotherapy: Secondary | ICD-10-CM

## 2014-04-29 DIAGNOSIS — C50211 Malignant neoplasm of upper-inner quadrant of right female breast: Secondary | ICD-10-CM

## 2014-04-29 DIAGNOSIS — D6481 Anemia due to antineoplastic chemotherapy: Secondary | ICD-10-CM

## 2014-04-29 DIAGNOSIS — Z170 Estrogen receptor positive status [ER+]: Secondary | ICD-10-CM

## 2014-04-29 DIAGNOSIS — C773 Secondary and unspecified malignant neoplasm of axilla and upper limb lymph nodes: Secondary | ICD-10-CM

## 2014-04-29 LAB — CBC WITH DIFFERENTIAL/PLATELET
BASO%: 0.7 % (ref 0.0–2.0)
BASOS ABS: 0 10*3/uL (ref 0.0–0.1)
EOS%: 1.4 % (ref 0.0–7.0)
Eosinophils Absolute: 0.1 10*3/uL (ref 0.0–0.5)
HEMATOCRIT: 33.1 % — AB (ref 34.8–46.6)
HEMOGLOBIN: 10.4 g/dL — AB (ref 11.6–15.9)
LYMPH#: 1 10*3/uL (ref 0.9–3.3)
LYMPH%: 24.8 % (ref 14.0–49.7)
MCH: 28.3 pg (ref 25.1–34.0)
MCHC: 31.4 g/dL — ABNORMAL LOW (ref 31.5–36.0)
MCV: 89.9 fL (ref 79.5–101.0)
MONO#: 0.3 10*3/uL (ref 0.1–0.9)
MONO%: 7.4 % (ref 0.0–14.0)
NEUT#: 2.8 10*3/uL (ref 1.5–6.5)
NEUT%: 65.7 % (ref 38.4–76.8)
Platelets: 272 10*3/uL (ref 145–400)
RBC: 3.68 10*6/uL — ABNORMAL LOW (ref 3.70–5.45)
RDW: 14.8 % — ABNORMAL HIGH (ref 11.2–14.5)
WBC: 4.2 10*3/uL (ref 3.9–10.3)

## 2014-04-29 LAB — COMPREHENSIVE METABOLIC PANEL (CC13)
ALBUMIN: 3.7 g/dL (ref 3.5–5.0)
ALT: 54 U/L (ref 0–55)
AST: 31 U/L (ref 5–34)
Alkaline Phosphatase: 62 U/L (ref 40–150)
Anion Gap: 5 mEq/L (ref 3–11)
BILIRUBIN TOTAL: 0.28 mg/dL (ref 0.20–1.20)
BUN: 7.5 mg/dL (ref 7.0–26.0)
CHLORIDE: 107 meq/L (ref 98–109)
CO2: 26 meq/L (ref 22–29)
CREATININE: 0.7 mg/dL (ref 0.6–1.1)
Calcium: 8.6 mg/dL (ref 8.4–10.4)
EGFR: >90 mL/min/{1.73_m2} (ref >90)
Glucose: 109 mg/dl (ref 70–140)
Potassium: 3.8 mEq/L (ref 3.5–5.1)
Sodium: 138 mEq/L (ref 136–145)
TOTAL PROTEIN: 6.9 g/dL (ref 6.4–8.3)

## 2014-04-29 MED ORDER — DEXAMETHASONE SODIUM PHOSPHATE 20 MG/5ML IJ SOLN
20.0000 mg | Freq: Once | INTRAMUSCULAR | Status: AC
  Administered 2014-04-29: 20 mg via INTRAVENOUS

## 2014-04-29 MED ORDER — FAMOTIDINE IN NACL 20-0.9 MG/50ML-% IV SOLN
INTRAVENOUS | Status: AC
  Filled 2014-04-29: qty 50

## 2014-04-29 MED ORDER — HEPARIN SOD (PORK) LOCK FLUSH 100 UNIT/ML IV SOLN
500.0000 [IU] | Freq: Once | INTRAVENOUS | Status: AC | PRN
  Administered 2014-04-29: 500 [IU]
  Filled 2014-04-29: qty 5

## 2014-04-29 MED ORDER — ONDANSETRON 16 MG/50ML IVPB (CHCC)
INTRAVENOUS | Status: AC
  Filled 2014-04-29: qty 16

## 2014-04-29 MED ORDER — SODIUM CHLORIDE 0.9 % IJ SOLN
10.0000 mL | INTRAMUSCULAR | Status: DC | PRN
  Administered 2014-04-29: 10 mL
  Filled 2014-04-29: qty 10

## 2014-04-29 MED ORDER — FAMOTIDINE IN NACL 20-0.9 MG/50ML-% IV SOLN
20.0000 mg | Freq: Once | INTRAVENOUS | Status: AC
  Administered 2014-04-29: 20 mg via INTRAVENOUS

## 2014-04-29 MED ORDER — SODIUM CHLORIDE 0.9 % IV SOLN
Freq: Once | INTRAVENOUS | Status: AC
  Administered 2014-04-29: 10:00:00 via INTRAVENOUS

## 2014-04-29 MED ORDER — DIPHENHYDRAMINE HCL 50 MG/ML IJ SOLN
INTRAMUSCULAR | Status: AC
  Filled 2014-04-29: qty 1

## 2014-04-29 MED ORDER — DIPHENHYDRAMINE HCL 50 MG/ML IJ SOLN
50.0000 mg | Freq: Once | INTRAMUSCULAR | Status: AC
  Administered 2014-04-29: 50 mg via INTRAVENOUS

## 2014-04-29 MED ORDER — ONDANSETRON 16 MG/50ML IVPB (CHCC)
16.0000 mg | Freq: Once | INTRAVENOUS | Status: AC
  Administered 2014-04-29: 16 mg via INTRAVENOUS

## 2014-04-29 MED ORDER — DEXAMETHASONE SODIUM PHOSPHATE 20 MG/5ML IJ SOLN
INTRAMUSCULAR | Status: AC
  Filled 2014-04-29: qty 5

## 2014-04-29 MED ORDER — PACLITAXEL CHEMO INJECTION 300 MG/50ML
80.0000 mg/m2 | Freq: Once | INTRAVENOUS | Status: AC
  Administered 2014-04-29: 156 mg via INTRAVENOUS
  Filled 2014-04-29: qty 26

## 2014-04-29 NOTE — Addendum Note (Signed)
Addended by: Prentiss Bells on: 04/29/2014 09:17 AM   Modules accepted: Medications

## 2014-04-29 NOTE — Assessment & Plan Note (Addendum)
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   Current Treatment: Patient completed neoadjuvant dose dense Adriamycin and Cytoxan. Weekly Taxol treatment started 04/08/2014, today is week 4/12 of Taxol  Chemotherapy related toxicities: 1. Alopecia 2. Chemotherapy related anemia: Being monitored 3. Loss of taste 4. Nail bed discoloration 5. 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 Her taste has come back and she is very happy about it.   Return to clinic 2 weeks but she will come weekly for Taxol treatments.

## 2014-04-29 NOTE — Progress Notes (Signed)
Patient Care Team: Eloise Levels, NP as PCP - General (Nurse Practitioner) Autumn Messing III, MD as Consulting Physician (General Surgery) Rulon Eisenmenger, MD as Consulting Physician (Hematology and Oncology) [name], MD as Attending Physician (Radiation Oncology)  DIAGNOSIS: Breast cancer of upper-inner quadrant of right female breast   Staging form: Breast, AJCC 7th Edition     Clinical: Stage IIB (T2, N1, cM0) - Unsigned       Staging comments: Staged at breast conference 01/20/14.      Pathologic: No stage assigned - Unsigned   SUMMARY OF ONCOLOGIC HISTORY:   Breast cancer of upper-inner quadrant of right female breast   01/12/2014 Mammogram Right breast 2:00 position 1.6 x 1.9 cm and the 12:00 position 0.5 x 0.9 cm distance between the 2 was 4.5 cm, right axillary lymph node enlargement   01/12/2014 Initial Diagnosis All 3 biopsies were IDC with DCIS ER percent PR 7200% Ki-67 85% HER-2 negative: Right axillary lymph node also positive for metastatic cancer ER positive HER-2 negative Ki-67 80% grade 2   01/18/2014 Breast MRI Right breast 12:00 position 2.8 x 2 x 2.2 cm: 2:00 position 1.3 x 0.8 x 0.5 cm, right axilla multiple enlarged lymph nodes 1 cm in size, right retropectoral lymph node 0.6 cm   02/11/2014 -  Neo-Adjuvant Chemotherapy Doxorubicin and Cyclophosphamide X 4 followed by Taxol and carboplatin weekly x12     CHIEF COMPLIANT: Week 4 of Taxol  INTERVAL HISTORY: Debbie Martinez is a 39 year old lady with above-mentioned history of right-sided breast cancer on neoadjuvant chemotherapy. She is cruising through Taxol treatments extremely well. She denies any new nausea or vomiting. She reports that the tips of the fingers feel slightly tingly but denies any pain or discomfort. No symptoms of tingling or numbness in the extremities of the lower legs. Denies any fevers or chills. She has good energy levels and her case but have come back.  REVIEW OF SYSTEMS:   Constitutional:  Denies fevers, chills or abnormal weight loss Eyes: Denies blurriness of vision Ears, nose, mouth, throat, and face: Denies mucositis or sore throat Respiratory: Denies cough, dyspnea or wheezes Cardiovascular: Denies palpitation, chest discomfort or lower extremity swelling Gastrointestinal:  Denies nausea, heartburn or change in bowel habits Skin: Denies abnormal skin rashes Lymphatics: Denies new lymphadenopathy or easy bruising Neurological:Denies numbness, tingling or new weaknesses Behavioral/Psych: Mood is stable, no new changes  All other systems were reviewed with the patient and are negative.  I have reviewed the past medical history, past surgical history, social history and family history with the patient and they are unchanged from previous note.  ALLERGIES:  is allergic to lisinopril.  MEDICATIONS:  Current Outpatient Prescriptions  Medication Sig Dispense Refill  . atenolol (TENORMIN) 50 MG tablet Take 50 mg by mouth daily.    Marland Kitchen ENSURE (ENSURE) Take 237 mLs by mouth.    . ferrous sulfate 325 (65 FE) MG tablet Take 325 mg by mouth daily with breakfast.    . LORazepam (ATIVAN) 0.5 MG tablet Take 1 tablet (0.5 mg total) by mouth every 6 (six) hours as needed for anxiety. 30 tablet 0  . oxyCODONE-acetaminophen (ROXICET) 5-325 MG per tablet Take 1-2 tablets by mouth every 4 (four) hours as needed. 50 tablet 0  . UNABLE TO FIND Apply 1 each topically as needed. 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    PHYSICAL EXAMINATION: ECOG PERFORMANCE STATUS: 1 -  Symptomatic but completely ambulatory  Filed Vitals:   04/29/14 0850  BP: 134/97  Pulse: 76  Temp: 97.7 F (36.5 C)  Resp: 18   Filed Weights   04/29/14 0850  Weight: 175 lb (79.379 kg)    GENERAL:alert, no distress and comfortable SKIN: skin color, texture, turgor are normal, no rashes or significant lesions EYES:  normal, Conjunctiva are pink and non-injected, sclera clear OROPHARYNX:no exudate, no erythema and lips, buccal mucosa, and tongue normal  NECK: supple, thyroid normal size, non-tender, without nodularity LYMPH:  no palpable lymphadenopathy in the cervical, axillary or inguinal LUNGS: clear to auscultation and percussion with normal breathing effort HEART: regular rate & rhythm and no murmurs and no lower extremity edema ABDOMEN:abdomen soft, non-tender and normal bowel sounds Musculoskeletal:no cyanosis of digits and no clubbing  NEURO: alert & oriented x 3 with fluent speech, no focal motor/sensory deficits  LABORATORY DATA:  I have reviewed the data as listed   Chemistry      Component Value Date/Time   NA 140 04/22/2014 0912   K 3.8 04/22/2014 0912   CO2 28 04/22/2014 0912   BUN 10.7 04/22/2014 0912   CREATININE 0.7 04/22/2014 0912      Component Value Date/Time   CALCIUM 9.6 04/22/2014 0912   ALKPHOS 69 04/22/2014 0912   AST 27 04/22/2014 0912   ALT 43 04/22/2014 0912   BILITOT 0.50 04/22/2014 0912       Lab Results  Component Value Date   WBC 4.2 04/29/2014   HGB 10.4* 04/29/2014   HCT 33.1* 04/29/2014   MCV 89.9 04/29/2014   PLT 272 04/29/2014   NEUTROABS 2.8 04/29/2014   ASSESSMENT & PLAN:  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   Current Treatment: Patient completed neoadjuvant dose dense Adriamycin and Cytoxan. Weekly Taxol treatment started 04/08/2014, today is week 4/12 of Taxol  Chemotherapy related toxicities: 1. Alopecia 2. Chemotherapy related anemia: Being monitored 3. Loss of taste 4. Nail bed discoloration 5. 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 Chemotherapy induced anemia grade 1: Being monitored  Patient denies any  nausea vomiting. Her taste has come back and she is very happy about it.  I reviewed her blood work and her counts are adequate to treat. Return to clinic 2 weeks but she will come weekly for Taxol treatments.   Orders Placed This Encounter  Procedures  . CBC with Differential    Standing Status: Future     Number of Occurrences:      Standing Expiration Date: 04/29/2015  . Comprehensive metabolic panel (Cmet) - [facility]    Standing Status: Future     Number of Occurrences:      Standing Expiration Date: 04/29/2015   The patient has a good understanding of the overall plan. she agrees with it. She will call with any problems that may develop before her next visit here.   Rulon Eisenmenger, MD 04/29/2014 8:59 AM

## 2014-04-29 NOTE — Patient Instructions (Signed)
[facility] Discharge Instructions for Patients Receiving Chemotherapy  Today you received the following chemotherapy agents: Taxol.  To help prevent nausea and vomiting after your treatment, we encourage you to take your nausea medication as prescribed.   If you develop nausea and vomiting that is not controlled by your nausea medication, call the clinic.   BELOW ARE SYMPTOMS THAT SHOULD BE REPORTED IMMEDIATELY:  *FEVER GREATER THAN 100.5 F  *CHILLS WITH OR WITHOUT FEVER  NAUSEA AND VOMITING THAT IS NOT CONTROLLED WITH YOUR NAUSEA MEDICATION  *UNUSUAL SHORTNESS OF BREATH  *UNUSUAL BRUISING OR BLEEDING  TENDERNESS IN MOUTH AND THROAT WITH OR WITHOUT PRESENCE OF ULCERS  *URINARY PROBLEMS  *BOWEL PROBLEMS  UNUSUAL RASH Items with * indicate a potential emergency and should be followed up as soon as possible.  Feel free to call the clinic you have any questions or concerns. The clinic phone number is [phone].    

## 2014-04-30 ENCOUNTER — Telehealth: Payer: Self-pay | Admitting: Hematology and Oncology

## 2014-04-30 NOTE — Telephone Encounter (Signed)
, °

## 2014-05-06 ENCOUNTER — Encounter: Payer: Self-pay | Admitting: *Deleted

## 2014-05-06 ENCOUNTER — Other Ambulatory Visit (HOSPITAL_BASED_OUTPATIENT_CLINIC_OR_DEPARTMENT_OTHER): Payer: Medicaid Other

## 2014-05-06 ENCOUNTER — Ambulatory Visit (HOSPITAL_BASED_OUTPATIENT_CLINIC_OR_DEPARTMENT_OTHER): Payer: Medicaid Other

## 2014-05-06 ENCOUNTER — Other Ambulatory Visit: Payer: Self-pay | Admitting: Hematology and Oncology

## 2014-05-06 DIAGNOSIS — C773 Secondary and unspecified malignant neoplasm of axilla and upper limb lymph nodes: Secondary | ICD-10-CM

## 2014-05-06 DIAGNOSIS — C50211 Malignant neoplasm of upper-inner quadrant of right female breast: Secondary | ICD-10-CM

## 2014-05-06 DIAGNOSIS — C50811 Malignant neoplasm of overlapping sites of right female breast: Secondary | ICD-10-CM

## 2014-05-06 DIAGNOSIS — Z5111 Encounter for antineoplastic chemotherapy: Secondary | ICD-10-CM

## 2014-05-06 LAB — CBC WITH DIFFERENTIAL/PLATELET
BASO%: 0.6 % (ref 0.0–2.0)
Basophils Absolute: 0 10*3/uL (ref 0.0–0.1)
EOS ABS: 0.1 10*3/uL (ref 0.0–0.5)
EOS%: 1.5 % (ref 0.0–7.0)
HCT: 33.8 % — ABNORMAL LOW (ref 34.8–46.6)
HEMOGLOBIN: 10.6 g/dL — AB (ref 11.6–15.9)
LYMPH%: 19.1 % (ref 14.0–49.7)
MCH: 28.1 pg (ref 25.1–34.0)
MCHC: 31.2 g/dL — ABNORMAL LOW (ref 31.5–36.0)
MCV: 89.9 fL (ref 79.5–101.0)
MONO#: 0.2 10*3/uL (ref 0.1–0.9)
MONO%: 4.5 % (ref 0.0–14.0)
NEUT#: 3.5 10*3/uL (ref 1.5–6.5)
NEUT%: 74.3 % (ref 38.4–76.8)
PLATELETS: 233 10*3/uL (ref 145–400)
RBC: 3.77 10*6/uL (ref 3.70–5.45)
RDW: 15.4 % — ABNORMAL HIGH (ref 11.2–14.5)
WBC: 4.7 10*3/uL (ref 3.9–10.3)
lymph#: 0.9 10*3/uL (ref 0.9–3.3)

## 2014-05-06 LAB — COMPREHENSIVE METABOLIC PANEL (CC13)
ALT: 55 U/L (ref 0–55)
ANION GAP: 8 meq/L (ref 3–11)
AST: 24 U/L (ref 5–34)
Albumin: 3.8 g/dL (ref 3.5–5.0)
Alkaline Phosphatase: 67 U/L (ref 40–150)
BILIRUBIN TOTAL: 0.26 mg/dL (ref 0.20–1.20)
BUN: 8.1 mg/dL (ref 7.0–26.0)
CO2: 28 mEq/L (ref 22–29)
Calcium: 8.9 mg/dL (ref 8.4–10.4)
Chloride: 104 mEq/L (ref 98–109)
Creatinine: 0.7 mg/dL (ref 0.6–1.1)
Glucose: 145 mg/dl — ABNORMAL HIGH (ref 70–140)
Potassium: 3.7 mEq/L (ref 3.5–5.1)
Sodium: 139 mEq/L (ref 136–145)
Total Protein: 7 g/dL (ref 6.4–8.3)

## 2014-05-06 MED ORDER — PACLITAXEL CHEMO INJECTION 300 MG/50ML
80.0000 mg/m2 | Freq: Once | INTRAVENOUS | Status: AC
  Administered 2014-05-06: 156 mg via INTRAVENOUS
  Filled 2014-05-06: qty 26

## 2014-05-06 MED ORDER — FAMOTIDINE IN NACL 20-0.9 MG/50ML-% IV SOLN
INTRAVENOUS | Status: AC
  Filled 2014-05-06: qty 50

## 2014-05-06 MED ORDER — SODIUM CHLORIDE 0.9 % IV SOLN
Freq: Once | INTRAVENOUS | Status: AC
  Administered 2014-05-06: 10:00:00 via INTRAVENOUS

## 2014-05-06 MED ORDER — HEPARIN SOD (PORK) LOCK FLUSH 100 UNIT/ML IV SOLN
500.0000 [IU] | Freq: Once | INTRAVENOUS | Status: AC | PRN
  Administered 2014-05-06: 500 [IU]
  Filled 2014-05-06: qty 5

## 2014-05-06 MED ORDER — DIPHENHYDRAMINE HCL 50 MG/ML IJ SOLN
50.0000 mg | Freq: Once | INTRAMUSCULAR | Status: AC
  Administered 2014-05-06: 50 mg via INTRAVENOUS

## 2014-05-06 MED ORDER — DEXAMETHASONE SODIUM PHOSPHATE 20 MG/5ML IJ SOLN
20.0000 mg | Freq: Once | INTRAMUSCULAR | Status: AC
  Administered 2014-05-06: 20 mg via INTRAVENOUS

## 2014-05-06 MED ORDER — SODIUM CHLORIDE 0.9 % IJ SOLN
10.0000 mL | INTRAMUSCULAR | Status: DC | PRN
  Administered 2014-05-06: 10 mL
  Filled 2014-05-06: qty 10

## 2014-05-06 MED ORDER — DIPHENHYDRAMINE HCL 50 MG/ML IJ SOLN
INTRAMUSCULAR | Status: AC
  Filled 2014-05-06: qty 1

## 2014-05-06 MED ORDER — ONDANSETRON 16 MG/50ML IVPB (CHCC)
16.0000 mg | Freq: Once | INTRAVENOUS | Status: AC
  Administered 2014-05-06: 16 mg via INTRAVENOUS

## 2014-05-06 MED ORDER — ONDANSETRON 16 MG/50ML IVPB (CHCC)
INTRAVENOUS | Status: AC
  Filled 2014-05-06: qty 16

## 2014-05-06 MED ORDER — FAMOTIDINE IN NACL 20-0.9 MG/50ML-% IV SOLN
20.0000 mg | Freq: Once | INTRAVENOUS | Status: AC
  Administered 2014-05-06: 20 mg via INTRAVENOUS

## 2014-05-06 MED ORDER — DEXAMETHASONE SODIUM PHOSPHATE 20 MG/5ML IJ SOLN
INTRAMUSCULAR | Status: AC
Start: 2014-05-06 — End: 2014-05-06
  Filled 2014-05-06: qty 5

## 2014-05-06 NOTE — Patient Instructions (Signed)
[facility] Discharge Instructions for Patients Receiving Chemotherapy  Today you received the following chemotherapy agents Taxol.  To help prevent nausea and vomiting after your treatment, we encourage you to take your nausea medication as directed.    If you develop nausea and vomiting that is not controlled by your nausea medication, call the clinic.   BELOW ARE SYMPTOMS THAT SHOULD BE REPORTED IMMEDIATELY:  *FEVER GREATER THAN 100.5 F  *CHILLS WITH OR WITHOUT FEVER  NAUSEA AND VOMITING THAT IS NOT CONTROLLED WITH YOUR NAUSEA MEDICATION  *UNUSUAL SHORTNESS OF BREATH  *UNUSUAL BRUISING OR BLEEDING  TENDERNESS IN MOUTH AND THROAT WITH OR WITHOUT PRESENCE OF ULCERS  *URINARY PROBLEMS  *BOWEL PROBLEMS  UNUSUAL RASH Items with * indicate a potential emergency and should be followed up as soon as possible.  Feel free to call the clinic you have any questions or concerns. The clinic phone number is [phone].    

## 2014-05-12 ENCOUNTER — Other Ambulatory Visit: Payer: Self-pay | Admitting: Hematology and Oncology

## 2014-05-12 ENCOUNTER — Other Ambulatory Visit: Payer: Self-pay

## 2014-05-12 DIAGNOSIS — C50211 Malignant neoplasm of upper-inner quadrant of right female breast: Secondary | ICD-10-CM

## 2014-05-13 ENCOUNTER — Telehealth: Payer: Self-pay | Admitting: Hematology and Oncology

## 2014-05-13 ENCOUNTER — Other Ambulatory Visit (HOSPITAL_BASED_OUTPATIENT_CLINIC_OR_DEPARTMENT_OTHER): Payer: Medicaid Other

## 2014-05-13 ENCOUNTER — Other Ambulatory Visit: Payer: BC Managed Care – PPO

## 2014-05-13 ENCOUNTER — Ambulatory Visit (HOSPITAL_BASED_OUTPATIENT_CLINIC_OR_DEPARTMENT_OTHER): Payer: Medicaid Other | Admitting: Hematology and Oncology

## 2014-05-13 ENCOUNTER — Ambulatory Visit (HOSPITAL_BASED_OUTPATIENT_CLINIC_OR_DEPARTMENT_OTHER): Payer: Medicaid Other

## 2014-05-13 VITALS — BP 140/96 | HR 87 | Temp 98.7°F | Resp 20 | Ht 67.0 in | Wt 178.0 lb

## 2014-05-13 DIAGNOSIS — C50211 Malignant neoplasm of upper-inner quadrant of right female breast: Secondary | ICD-10-CM

## 2014-05-13 DIAGNOSIS — D6481 Anemia due to antineoplastic chemotherapy: Secondary | ICD-10-CM

## 2014-05-13 DIAGNOSIS — G629 Polyneuropathy, unspecified: Secondary | ICD-10-CM

## 2014-05-13 DIAGNOSIS — Z170 Estrogen receptor positive status [ER+]: Secondary | ICD-10-CM

## 2014-05-13 DIAGNOSIS — Z5111 Encounter for antineoplastic chemotherapy: Secondary | ICD-10-CM

## 2014-05-13 DIAGNOSIS — C50811 Malignant neoplasm of overlapping sites of right female breast: Secondary | ICD-10-CM

## 2014-05-13 LAB — COMPREHENSIVE METABOLIC PANEL (CC13)
ALBUMIN: 3.9 g/dL (ref 3.5–5.0)
ALK PHOS: 63 U/L (ref 40–150)
ALT: 34 U/L (ref 0–55)
ANION GAP: 7 meq/L (ref 3–11)
AST: 17 U/L (ref 5–34)
BUN: 9.9 mg/dL (ref 7.0–26.0)
CALCIUM: 8.9 mg/dL (ref 8.4–10.4)
CHLORIDE: 107 meq/L (ref 98–109)
CO2: 26 meq/L (ref 22–29)
Creatinine: 0.7 mg/dL (ref 0.6–1.1)
EGFR: >90 mL/min/{1.73_m2} (ref >90)
GLUCOSE: 123 mg/dL (ref 70–140)
Potassium: 4 mEq/L (ref 3.5–5.1)
Sodium: 140 mEq/L (ref 136–145)
TOTAL PROTEIN: 7.2 g/dL (ref 6.4–8.3)
Total Bilirubin: 0.2 mg/dL (ref 0.20–1.20)

## 2014-05-13 LAB — CBC WITH DIFFERENTIAL/PLATELET
BASO%: 0.5 % (ref 0.0–2.0)
Basophils Absolute: 0 10*3/uL (ref 0.0–0.1)
EOS%: 1.7 % (ref 0.0–7.0)
Eosinophils Absolute: 0.1 10*3/uL (ref 0.0–0.5)
HCT: 34.4 % — ABNORMAL LOW (ref 34.8–46.6)
HEMOGLOBIN: 10.7 g/dL — AB (ref 11.6–15.9)
LYMPH#: 1.1 10*3/uL (ref 0.9–3.3)
LYMPH%: 20.6 % (ref 14.0–49.7)
MCH: 27.8 pg (ref 25.1–34.0)
MCHC: 31.2 g/dL — AB (ref 31.5–36.0)
MCV: 89 fL (ref 79.5–101.0)
MONO#: 0.3 10*3/uL (ref 0.1–0.9)
MONO%: 5.4 % (ref 0.0–14.0)
NEUT#: 3.7 10*3/uL (ref 1.5–6.5)
NEUT%: 71.8 % (ref 38.4–76.8)
Platelets: 220 10*3/uL (ref 145–400)
RBC: 3.87 10*6/uL (ref 3.70–5.45)
RDW: 14.8 % — ABNORMAL HIGH (ref 11.2–14.5)
WBC: 5.2 10*3/uL (ref 3.9–10.3)

## 2014-05-13 MED ORDER — DIPHENHYDRAMINE HCL 50 MG/ML IJ SOLN
INTRAMUSCULAR | Status: AC
  Filled 2014-05-13: qty 1

## 2014-05-13 MED ORDER — ONDANSETRON 16 MG/50ML IVPB (CHCC)
16.0000 mg | Freq: Once | INTRAVENOUS | Status: AC
  Administered 2014-05-13: 16 mg via INTRAVENOUS

## 2014-05-13 MED ORDER — ONDANSETRON 16 MG/50ML IVPB (CHCC)
INTRAVENOUS | Status: AC
  Filled 2014-05-13: qty 16

## 2014-05-13 MED ORDER — DIPHENHYDRAMINE HCL 50 MG/ML IJ SOLN
50.0000 mg | Freq: Once | INTRAMUSCULAR | Status: AC
  Administered 2014-05-13: 50 mg via INTRAVENOUS

## 2014-05-13 MED ORDER — SODIUM CHLORIDE 0.9 % IV SOLN
Freq: Once | INTRAVENOUS | Status: AC
  Administered 2014-05-13: 10:00:00 via INTRAVENOUS

## 2014-05-13 MED ORDER — FAMOTIDINE IN NACL 20-0.9 MG/50ML-% IV SOLN
INTRAVENOUS | Status: AC
  Filled 2014-05-13: qty 50

## 2014-05-13 MED ORDER — PACLITAXEL CHEMO INJECTION 300 MG/50ML
80.0000 mg/m2 | Freq: Once | INTRAVENOUS | Status: AC
  Administered 2014-05-13: 156 mg via INTRAVENOUS
  Filled 2014-05-13: qty 26

## 2014-05-13 MED ORDER — FAMOTIDINE IN NACL 20-0.9 MG/50ML-% IV SOLN
20.0000 mg | Freq: Once | INTRAVENOUS | Status: AC
  Administered 2014-05-13: 20 mg via INTRAVENOUS

## 2014-05-13 MED ORDER — HEPARIN SOD (PORK) LOCK FLUSH 100 UNIT/ML IV SOLN
500.0000 [IU] | Freq: Once | INTRAVENOUS | Status: AC | PRN
  Administered 2014-05-13: 500 [IU]
  Filled 2014-05-13: qty 5

## 2014-05-13 MED ORDER — DEXAMETHASONE SODIUM PHOSPHATE 20 MG/5ML IJ SOLN
20.0000 mg | Freq: Once | INTRAMUSCULAR | Status: AC
  Administered 2014-05-13: 20 mg via INTRAVENOUS

## 2014-05-13 MED ORDER — DEXAMETHASONE SODIUM PHOSPHATE 20 MG/5ML IJ SOLN
INTRAMUSCULAR | Status: AC
  Filled 2014-05-13: qty 5

## 2014-05-13 MED ORDER — SODIUM CHLORIDE 0.9 % IJ SOLN
10.0000 mL | INTRAMUSCULAR | Status: DC | PRN
  Administered 2014-05-13: 10 mL
  Filled 2014-05-13: qty 10

## 2014-05-13 NOTE — Assessment & Plan Note (Signed)
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   Current Treatment: Patient completed neoadjuvant dose dense Adriamycin and Cytoxan. Weekly Taxol treatment started 04/08/2014, today is week 6/12 of Taxol  Chemotherapy related toxicities: 1. Alopecia 2. Chemotherapy related anemia: Being monitored 3. Loss of taste 4. Nail bed discoloration 5. 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 Chemotherapy induced anemia grade 1: Being monitored  Patient denies any nausea vomiting. I reviewed her blood work and her counts are adequate to treat. Return to clinic 2 weeks but she will come weekly for Taxol treatments.

## 2014-05-13 NOTE — Telephone Encounter (Signed)
lvm for pt confirming all appt

## 2014-05-13 NOTE — Patient Instructions (Signed)
[facility] Discharge Instructions for Patients Receiving Chemotherapy  Today you received the following chemotherapy agents taxol  To help prevent nausea and vomiting after your treatment, we encourage you to take your nausea medication as directed   If you develop nausea and vomiting that is not controlled by your nausea medication, call the clinic.   BELOW ARE SYMPTOMS THAT SHOULD BE REPORTED IMMEDIATELY:  *FEVER GREATER THAN 100.5 F  *CHILLS WITH OR WITHOUT FEVER  NAUSEA AND VOMITING THAT IS NOT CONTROLLED WITH YOUR NAUSEA MEDICATION  *UNUSUAL SHORTNESS OF BREATH  *UNUSUAL BRUISING OR BLEEDING  TENDERNESS IN MOUTH AND THROAT WITH OR WITHOUT PRESENCE OF ULCERS  *URINARY PROBLEMS  *BOWEL PROBLEMS  UNUSUAL RASH Items with * indicate a potential emergency and should be followed up as soon as possible.  Feel free to call the clinic you have any questions or concerns. The clinic phone number is [phone].  

## 2014-05-13 NOTE — Progress Notes (Signed)
Patient Care Team: Eloise Levels, NP as PCP - General (Nurse Practitioner) Autumn Messing III, MD as Consulting Physician (General Surgery) Rulon Eisenmenger, MD as Consulting Physician (Hematology and Oncology) [name], MD as Attending Physician (Radiation Oncology)  DIAGNOSIS: Breast cancer of upper-inner quadrant of right female breast   Staging form: Breast, AJCC 7th Edition     Clinical: Stage IIB (T2, N1, cM0) - Unsigned       Staging comments: Staged at breast conference 01/20/14.      Pathologic: No stage assigned - Unsigned   SUMMARY OF ONCOLOGIC HISTORY:   Breast cancer of upper-inner quadrant of right female breast   01/12/2014 Mammogram Right breast 2:00 position 1.6 x 1.9 cm and the 12:00 position 0.5 x 0.9 cm distance between the 2 was 4.5 cm, right axillary lymph node enlargement   01/12/2014 Initial Diagnosis All 3 biopsies were IDC with DCIS ER percent PR 7200% Ki-67 85% HER-2 negative: Right axillary lymph node also positive for metastatic cancer ER positive HER-2 negative Ki-67 80% grade 2   01/18/2014 Breast MRI Right breast 12:00 position 2.8 x 2 x 2.2 cm: 2:00 position 1.3 x 0.8 x 0.5 cm, right axilla multiple enlarged lymph nodes 1 cm in size, right retropectoral lymph node 0.6 cm   02/11/2014 -  Neo-Adjuvant Chemotherapy Doxorubicin and Cyclophosphamide X 4 followed by Taxol and carboplatin weekly x12     CHIEF COMPLIANT: week 6/12 of Taxol  INTERVAL HISTORY: EVONNE RINKS is a 39 year old lady with above-mentioned history of right-sided breast cancer currently on neoadjuvant chemotherapy with Taxol treatments. She has been tolerating Taxol extremely well without any major problems or concerns. Today is week 6 of 12. Denies any nausea vomiting denies fevers or chills denies any cough or expectoration.  REVIEW OF SYSTEMS:   Constitutional: Denies fevers, chills or abnormal weight loss Eyes: Denies blurriness of vision Ears, nose, mouth, throat, and face: Denies  mucositis or sore throat Respiratory: Denies cough, dyspnea or wheezes Cardiovascular: Denies palpitation, chest discomfort or lower extremity swelling Gastrointestinal:  Denies nausea, heartburn or change in bowel habits Skin: Denies abnormal skin rashes Lymphatics: Denies new lymphadenopathy or easy bruising Neurological:Denies numbness, tingling or new weaknesses Behavioral/Psych: Mood is stable, no new changes  All other systems were reviewed with the patient and are negative.  I have reviewed the past medical history, past surgical history, social history and family history with the patient and they are unchanged from previous note.  ALLERGIES:  is allergic to lisinopril.  MEDICATIONS:  Current Outpatient Prescriptions  Medication Sig Dispense Refill  . atenolol (TENORMIN) 50 MG tablet Take 50 mg by mouth daily.    Marland Kitchen ENSURE (ENSURE) Take 237 mLs by mouth.    . ferrous sulfate 325 (65 FE) MG tablet Take 325 mg by mouth daily with breakfast.    . lidocaine-prilocaine (EMLA) cream APPLY TO AFFECTED AREA AS NEEDED 30 g 0  . LORazepam (ATIVAN) 0.5 MG tablet Take 1 tablet (0.5 mg total) by mouth every 6 (six) hours as needed for anxiety. 30 tablet 0  . oxyCODONE-acetaminophen (ROXICET) 5-325 MG per tablet Take 1-2 tablets by mouth every 4 (four) hours as needed. 50 tablet 0  . UNABLE TO FIND Apply 1 each topically as needed. 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    PHYSICAL EXAMINATION: ECOG PERFORMANCE STATUS: 1 - Symptomatic but completely ambulatory  Filed Vitals:   05/13/14 1324  BP: 140/96  Pulse: 87  Temp: 98.7 F (37.1 C)  Resp: 20   Filed Weights   05/13/14 0926  Weight: 178 lb (80.74 kg)    GENERAL:alert, no distress and comfortable SKIN: skin color, texture, turgor are normal, no rashes or significant lesions EYES: normal, Conjunctiva are pink and non-injected,  sclera clear OROPHARYNX:no exudate, no erythema and lips, buccal mucosa, and tongue normal  NECK: supple, thyroid normal size, non-tender, without nodularity LYMPH:  no palpable lymphadenopathy in the cervical, axillary or inguinal LUNGS: clear to auscultation and percussion with normal breathing effort HEART: regular rate & rhythm and no murmurs and no lower extremity edema ABDOMEN:abdomen soft, non-tender and normal bowel sounds Musculoskeletal:no cyanosis of digits and no clubbing  NEURO: alert & oriented x 3 with fluent speech, no focal motor/sensory deficits LABORATORY DATA:  I have reviewed the data as listed   Chemistry      Component Value Date/Time   NA 139 05/06/2014 0916   K 3.7 05/06/2014 0916   CO2 28 05/06/2014 0916   BUN 8.1 05/06/2014 0916   CREATININE 0.7 05/06/2014 0916      Component Value Date/Time   CALCIUM 8.9 05/06/2014 0916   ALKPHOS 67 05/06/2014 0916   AST 24 05/06/2014 0916   ALT 55 05/06/2014 0916   BILITOT 0.26 05/06/2014 0916       Lab Results  Component Value Date   WBC 5.2 05/13/2014   HGB 10.7* 05/13/2014   HCT 34.4* 05/13/2014   MCV 89.0 05/13/2014   PLT 220 05/13/2014   NEUTROABS 3.7 05/13/2014     ASSESSMENT & PLAN:  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   Current Treatment: Patient completed neoadjuvant dose dense Adriamycin and Cytoxan. Weekly Taxol treatment started 04/08/2014, today is week 6/12 of Taxol  Chemotherapy related toxicities: 1. Alopecia 2. Chemotherapy related anemia: Being monitored 3. Loss of taste 4. Nail bed discoloration 5. 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 Chemotherapy induced anemia grade 1: Being monitored  Patient denies any nausea vomiting. I reviewed her blood work  and her counts are adequate to treat. Return to clinic 2 weeks but she will come weekly for Taxol treatments.    No orders of the defined types were placed in this encounter.   The patient has a good understanding of the overall plan. she agrees with it. She will call with any problems that may develop before her next visit here.   Rulon Eisenmenger, MD

## 2014-05-20 ENCOUNTER — Other Ambulatory Visit (HOSPITAL_BASED_OUTPATIENT_CLINIC_OR_DEPARTMENT_OTHER): Payer: Medicaid Other

## 2014-05-20 ENCOUNTER — Ambulatory Visit (HOSPITAL_BASED_OUTPATIENT_CLINIC_OR_DEPARTMENT_OTHER): Payer: Medicaid Other

## 2014-05-20 DIAGNOSIS — C50811 Malignant neoplasm of overlapping sites of right female breast: Secondary | ICD-10-CM

## 2014-05-20 DIAGNOSIS — C50211 Malignant neoplasm of upper-inner quadrant of right female breast: Secondary | ICD-10-CM

## 2014-05-20 DIAGNOSIS — C773 Secondary and unspecified malignant neoplasm of axilla and upper limb lymph nodes: Secondary | ICD-10-CM

## 2014-05-20 DIAGNOSIS — Z5111 Encounter for antineoplastic chemotherapy: Secondary | ICD-10-CM

## 2014-05-20 LAB — CBC WITH DIFFERENTIAL/PLATELET
BASO%: 0.2 % (ref 0.0–2.0)
BASOS ABS: 0 10*3/uL (ref 0.0–0.1)
EOS ABS: 0.1 10*3/uL (ref 0.0–0.5)
EOS%: 1.6 % (ref 0.0–7.0)
HCT: 34.1 % — ABNORMAL LOW (ref 34.8–46.6)
HGB: 10.7 g/dL — ABNORMAL LOW (ref 11.6–15.9)
LYMPH#: 1.1 10*3/uL (ref 0.9–3.3)
LYMPH%: 25.6 % (ref 14.0–49.7)
MCH: 28.2 pg (ref 25.1–34.0)
MCHC: 31.4 g/dL — AB (ref 31.5–36.0)
MCV: 90 fL (ref 79.5–101.0)
MONO#: 0.2 10*3/uL (ref 0.1–0.9)
MONO%: 5.4 % (ref 0.0–14.0)
NEUT%: 67.2 % (ref 38.4–76.8)
NEUTROS ABS: 2.9 10*3/uL (ref 1.5–6.5)
Platelets: 259 10*3/uL (ref 145–400)
RBC: 3.79 10*6/uL (ref 3.70–5.45)
RDW: 14.4 % (ref 11.2–14.5)
WBC: 4.3 10*3/uL (ref 3.9–10.3)

## 2014-05-20 LAB — COMPREHENSIVE METABOLIC PANEL (CC13)
ALT: 30 U/L (ref 0–55)
AST: 25 U/L (ref 5–34)
Albumin: 3.9 g/dL (ref 3.5–5.0)
Alkaline Phosphatase: 66 U/L (ref 40–150)
Anion Gap: 11 mEq/L (ref 3–11)
BUN: 11.6 mg/dL (ref 7.0–26.0)
CO2: 22 meq/L (ref 22–29)
Calcium: 9.2 mg/dL (ref 8.4–10.4)
Chloride: 108 mEq/L (ref 98–109)
Creatinine: 0.7 mg/dL (ref 0.6–1.1)
EGFR: >90 mL/min/{1.73_m2} (ref >90)
GLUCOSE: 123 mg/dL (ref 70–140)
Potassium: 4 mEq/L (ref 3.5–5.1)
Sodium: 140 mEq/L (ref 136–145)
Total Bilirubin: 0.32 mg/dL (ref 0.20–1.20)
Total Protein: 7.2 g/dL (ref 6.4–8.3)

## 2014-05-20 MED ORDER — PACLITAXEL CHEMO INJECTION 300 MG/50ML
80.0000 mg/m2 | Freq: Once | INTRAVENOUS | Status: AC
  Administered 2014-05-20: 156 mg via INTRAVENOUS
  Filled 2014-05-20: qty 26

## 2014-05-20 MED ORDER — SODIUM CHLORIDE 0.9 % IJ SOLN
10.0000 mL | INTRAMUSCULAR | Status: DC | PRN
  Administered 2014-05-20: 10 mL
  Filled 2014-05-20: qty 10

## 2014-05-20 MED ORDER — FAMOTIDINE IN NACL 20-0.9 MG/50ML-% IV SOLN
20.0000 mg | Freq: Once | INTRAVENOUS | Status: AC
  Administered 2014-05-20: 20 mg via INTRAVENOUS

## 2014-05-20 MED ORDER — DIPHENHYDRAMINE HCL 50 MG/ML IJ SOLN
50.0000 mg | Freq: Once | INTRAMUSCULAR | Status: AC
  Administered 2014-05-20: 50 mg via INTRAVENOUS

## 2014-05-20 MED ORDER — ONDANSETRON 16 MG/50ML IVPB (CHCC)
16.0000 mg | Freq: Once | INTRAVENOUS | Status: AC
  Administered 2014-05-20: 16 mg via INTRAVENOUS

## 2014-05-20 MED ORDER — DIPHENHYDRAMINE HCL 50 MG/ML IJ SOLN
INTRAMUSCULAR | Status: AC
  Filled 2014-05-20: qty 1

## 2014-05-20 MED ORDER — DEXAMETHASONE SODIUM PHOSPHATE 20 MG/5ML IJ SOLN
20.0000 mg | Freq: Once | INTRAMUSCULAR | Status: AC
  Administered 2014-05-20: 20 mg via INTRAVENOUS

## 2014-05-20 MED ORDER — SODIUM CHLORIDE 0.9 % IV SOLN
Freq: Once | INTRAVENOUS | Status: AC
  Administered 2014-05-20: 10:00:00 via INTRAVENOUS

## 2014-05-20 MED ORDER — ONDANSETRON 16 MG/50ML IVPB (CHCC)
INTRAVENOUS | Status: AC
  Filled 2014-05-20: qty 16

## 2014-05-20 MED ORDER — DEXAMETHASONE SODIUM PHOSPHATE 20 MG/5ML IJ SOLN
INTRAMUSCULAR | Status: AC
  Filled 2014-05-20: qty 5

## 2014-05-20 MED ORDER — HEPARIN SOD (PORK) LOCK FLUSH 100 UNIT/ML IV SOLN
500.0000 [IU] | Freq: Once | INTRAVENOUS | Status: AC | PRN
  Administered 2014-05-20: 500 [IU]
  Filled 2014-05-20: qty 5

## 2014-05-20 MED ORDER — FAMOTIDINE IN NACL 20-0.9 MG/50ML-% IV SOLN
INTRAVENOUS | Status: AC
  Filled 2014-05-20: qty 50

## 2014-05-20 NOTE — Patient Instructions (Signed)
[facility] Discharge Instructions for Patients Receiving Chemotherapy  Today you received the following chemotherapy agents Paclitaxel.  To help prevent nausea and vomiting after your treatment, we encourage you to take your nausea medication as directed.    If you develop nausea and vomiting that is not controlled by your nausea medication, call the clinic.   BELOW ARE SYMPTOMS THAT SHOULD BE REPORTED IMMEDIATELY:  *FEVER GREATER THAN 100.5 F  *CHILLS WITH OR WITHOUT FEVER  NAUSEA AND VOMITING THAT IS NOT CONTROLLED WITH YOUR NAUSEA MEDICATION  *UNUSUAL SHORTNESS OF BREATH  *UNUSUAL BRUISING OR BLEEDING  TENDERNESS IN MOUTH AND THROAT WITH OR WITHOUT PRESENCE OF ULCERS  *URINARY PROBLEMS  *BOWEL PROBLEMS  UNUSUAL RASH Items with * indicate a potential emergency and should be followed up as soon as possible.  Feel free to call the clinic you have any questions or concerns. The clinic phone number is [phone].    

## 2014-05-26 ENCOUNTER — Other Ambulatory Visit: Payer: Self-pay | Admitting: *Deleted

## 2014-05-26 DIAGNOSIS — C50211 Malignant neoplasm of upper-inner quadrant of right female breast: Secondary | ICD-10-CM

## 2014-05-27 ENCOUNTER — Encounter: Payer: Self-pay | Admitting: *Deleted

## 2014-05-27 ENCOUNTER — Telehealth: Payer: Self-pay | Admitting: Hematology and Oncology

## 2014-05-27 ENCOUNTER — Other Ambulatory Visit (HOSPITAL_BASED_OUTPATIENT_CLINIC_OR_DEPARTMENT_OTHER): Payer: Medicaid Other

## 2014-05-27 ENCOUNTER — Ambulatory Visit (HOSPITAL_BASED_OUTPATIENT_CLINIC_OR_DEPARTMENT_OTHER): Payer: Medicaid Other

## 2014-05-27 ENCOUNTER — Ambulatory Visit (HOSPITAL_BASED_OUTPATIENT_CLINIC_OR_DEPARTMENT_OTHER): Payer: Medicaid Other | Admitting: Hematology and Oncology

## 2014-05-27 VITALS — BP 138/78 | HR 79 | Temp 98.5°F | Resp 18 | Ht 67.0 in | Wt 180.6 lb

## 2014-05-27 DIAGNOSIS — C50211 Malignant neoplasm of upper-inner quadrant of right female breast: Secondary | ICD-10-CM

## 2014-05-27 DIAGNOSIS — C50811 Malignant neoplasm of overlapping sites of right female breast: Secondary | ICD-10-CM

## 2014-05-27 DIAGNOSIS — C773 Secondary and unspecified malignant neoplasm of axilla and upper limb lymph nodes: Secondary | ICD-10-CM

## 2014-05-27 DIAGNOSIS — Z5111 Encounter for antineoplastic chemotherapy: Secondary | ICD-10-CM

## 2014-05-27 LAB — CBC WITH DIFFERENTIAL/PLATELET
BASO%: 0.2 % (ref 0.0–2.0)
Basophils Absolute: 0 10*3/uL (ref 0.0–0.1)
EOS ABS: 0.1 10*3/uL (ref 0.0–0.5)
EOS%: 0.9 % (ref 0.0–7.0)
HCT: 33.8 % — ABNORMAL LOW (ref 34.8–46.6)
HGB: 10.7 g/dL — ABNORMAL LOW (ref 11.6–15.9)
LYMPH%: 25.3 % (ref 14.0–49.7)
MCH: 28.6 pg (ref 25.1–34.0)
MCHC: 31.7 g/dL (ref 31.5–36.0)
MCV: 90.4 fL (ref 79.5–101.0)
MONO#: 0.3 10*3/uL (ref 0.1–0.9)
MONO%: 5.5 % (ref 0.0–14.0)
NEUT#: 3.6 10*3/uL (ref 1.5–6.5)
NEUT%: 68.1 % (ref 38.4–76.8)
PLATELETS: 279 10*3/uL (ref 145–400)
RBC: 3.74 10*6/uL (ref 3.70–5.45)
RDW: 14.3 % (ref 11.2–14.5)
WBC: 5.3 10*3/uL (ref 3.9–10.3)
lymph#: 1.3 10*3/uL (ref 0.9–3.3)

## 2014-05-27 LAB — COMPREHENSIVE METABOLIC PANEL (CC13)
ALT: 30 U/L (ref 0–55)
AST: 18 U/L (ref 5–34)
Albumin: 3.7 g/dL (ref 3.5–5.0)
Alkaline Phosphatase: 58 U/L (ref 40–150)
Anion Gap: 8 mEq/L (ref 3–11)
BUN: 10.3 mg/dL (ref 7.0–26.0)
CALCIUM: 9.1 mg/dL (ref 8.4–10.4)
CO2: 26 meq/L (ref 22–29)
Chloride: 107 mEq/L (ref 98–109)
Creatinine: 0.8 mg/dL (ref 0.6–1.1)
EGFR: >90 mL/min/{1.73_m2} (ref >90)
Glucose: 122 mg/dl (ref 70–140)
POTASSIUM: 4.3 meq/L (ref 3.5–5.1)
SODIUM: 142 meq/L (ref 136–145)
TOTAL PROTEIN: 6.9 g/dL (ref 6.4–8.3)
Total Bilirubin: 0.22 mg/dL (ref 0.20–1.20)

## 2014-05-27 MED ORDER — FAMOTIDINE IN NACL 20-0.9 MG/50ML-% IV SOLN
INTRAVENOUS | Status: AC
  Filled 2014-05-27: qty 50

## 2014-05-27 MED ORDER — PACLITAXEL CHEMO INJECTION 300 MG/50ML
80.0000 mg/m2 | Freq: Once | INTRAVENOUS | Status: AC
  Administered 2014-05-27: 156 mg via INTRAVENOUS
  Filled 2014-05-27: qty 26

## 2014-05-27 MED ORDER — HEPARIN SOD (PORK) LOCK FLUSH 100 UNIT/ML IV SOLN
500.0000 [IU] | Freq: Once | INTRAVENOUS | Status: AC | PRN
  Administered 2014-05-27: 500 [IU]
  Filled 2014-05-27: qty 5

## 2014-05-27 MED ORDER — DEXAMETHASONE SODIUM PHOSPHATE 20 MG/5ML IJ SOLN
20.0000 mg | Freq: Once | INTRAMUSCULAR | Status: AC
  Administered 2014-05-27: 20 mg via INTRAVENOUS

## 2014-05-27 MED ORDER — ONDANSETRON 16 MG/50ML IVPB (CHCC)
16.0000 mg | Freq: Once | INTRAVENOUS | Status: AC
  Administered 2014-05-27: 16 mg via INTRAVENOUS

## 2014-05-27 MED ORDER — SODIUM CHLORIDE 0.9 % IV SOLN
Freq: Once | INTRAVENOUS | Status: AC
  Administered 2014-05-27: 11:00:00 via INTRAVENOUS

## 2014-05-27 MED ORDER — ONDANSETRON 16 MG/50ML IVPB (CHCC)
INTRAVENOUS | Status: AC
  Filled 2014-05-27: qty 16

## 2014-05-27 MED ORDER — DIPHENHYDRAMINE HCL 50 MG/ML IJ SOLN
INTRAMUSCULAR | Status: AC
  Filled 2014-05-27: qty 1

## 2014-05-27 MED ORDER — DIPHENHYDRAMINE HCL 50 MG/ML IJ SOLN
50.0000 mg | Freq: Once | INTRAMUSCULAR | Status: AC
  Administered 2014-05-27: 50 mg via INTRAVENOUS

## 2014-05-27 MED ORDER — FAMOTIDINE IN NACL 20-0.9 MG/50ML-% IV SOLN
20.0000 mg | Freq: Once | INTRAVENOUS | Status: AC
  Administered 2014-05-27: 20 mg via INTRAVENOUS

## 2014-05-27 MED ORDER — SODIUM CHLORIDE 0.9 % IJ SOLN
10.0000 mL | INTRAMUSCULAR | Status: DC | PRN
  Administered 2014-05-27: 10 mL
  Filled 2014-05-27: qty 10

## 2014-05-27 MED ORDER — DEXAMETHASONE SODIUM PHOSPHATE 20 MG/5ML IJ SOLN
INTRAMUSCULAR | Status: AC
  Filled 2014-05-27: qty 5

## 2014-05-27 NOTE — Telephone Encounter (Signed)
per pof t sch pt appt-gave pt copy of sch

## 2014-05-27 NOTE — Patient Instructions (Signed)
[facility] Discharge Instructions for Patients Receiving Chemotherapy  Today you received the following chemotherapy agents: Taxol.  To help prevent nausea and vomiting after your treatment, we encourage you to take your nausea medication;  Lorazepam 0.5 mg as directed.   If you develop nausea and vomiting that is not controlled by your nausea medication, call the clinic.   BELOW ARE SYMPTOMS THAT SHOULD BE REPORTED IMMEDIATELY:  *FEVER GREATER THAN 100.5 F  *CHILLS WITH OR WITHOUT FEVER  NAUSEA AND VOMITING THAT IS NOT CONTROLLED WITH YOUR NAUSEA MEDICATION  *UNUSUAL SHORTNESS OF BREATH  *UNUSUAL BRUISING OR BLEEDING  TENDERNESS IN MOUTH AND THROAT WITH OR WITHOUT PRESENCE OF ULCERS  *URINARY PROBLEMS  *BOWEL PROBLEMS  UNUSUAL RASH Items with * indicate a potential emergency and should be followed up as soon as possible.  Feel free to call the clinic you have any questions or concerns. The clinic phone number is [phone].

## 2014-05-27 NOTE — Progress Notes (Signed)
Patient Care Team: Eloise Levels, NP as PCP - General (Nurse Practitioner) Autumn Messing III, MD as Consulting Physician (General Surgery) Rulon Eisenmenger, MD as Consulting Physician (Hematology and Oncology) [name], MD as Attending Physician (Radiation Oncology)  DIAGNOSIS: Breast cancer of upper-inner quadrant of right female breast   Staging form: Breast, AJCC 7th Edition     Clinical: Stage IIB (T2, N1, cM0) - Unsigned       Staging comments: Staged at breast conference 01/20/14.      Pathologic: No stage assigned - Unsigned   SUMMARY OF ONCOLOGIC HISTORY:   Breast cancer of upper-inner quadrant of right female breast   01/12/2014 Mammogram Right breast 2:00 position 1.6 x 1.9 cm and the 12:00 position 0.5 x 0.9 cm distance between the 2 was 4.5 cm, right axillary lymph node enlargement   01/12/2014 Initial Diagnosis All 3 biopsies were IDC with DCIS ER percent PR 7200% Ki-67 85% HER-2 negative: Right axillary lymph node also positive for metastatic cancer ER positive HER-2 negative Ki-67 80% grade 2   01/18/2014 Breast MRI Right breast 12:00 position 2.8 x 2 x 2.2 cm: 2:00 position 1.3 x 0.8 x 0.5 cm, right axilla multiple enlarged lymph nodes 1 cm in size, right retropectoral lymph node 0.6 cm   02/11/2014 -  Neo-Adjuvant Chemotherapy Doxorubicin and Cyclophosphamide X 4 followed by Taxol and carboplatin weekly x12     CHIEF COMPLIANT: Taxol week 8  INTERVAL HISTORY: Debbie Martinez is a 39 year old lady with above-mentioned history of right-sided breast cancer currently undergoing neoadjuvant chemotherapy. His is #8 of Taxol. She has been tolerating chemotherapy fairly well she has mild neuropathy of the tips of the fingers. She has noticed that the males have discolored especially in the thumb and great toe. Denies any nausea or vomiting. She has excellent appetite and taste. Denies any fevers or chills.  REVIEW OF SYSTEMS:   Constitutional: Denies fevers, chills or abnormal  weight loss Eyes: Denies blurriness of vision Ears, nose, mouth, throat, and face: Denies mucositis or sore throat Respiratory: Denies cough, dyspnea or wheezes Cardiovascular: Denies palpitation, chest discomfort or lower extremity swelling Gastrointestinal:  Denies nausea, heartburn or change in bowel habits Skin: Denies abnormal skin rashes Lymphatics: Denies new lymphadenopathy or easy bruising Neurological:Denies numbness, tingling or new weaknesses Behavioral/Psych: Mood is stable, no new changes   All other systems were reviewed with the patient and are negative.  I have reviewed the past medical history, past surgical history, social history and family history with the patient and they are unchanged from previous note.  ALLERGIES:  is allergic to lisinopril.  MEDICATIONS:  Current Outpatient Prescriptions  Medication Sig Dispense Refill  . atenolol (TENORMIN) 50 MG tablet Take 50 mg by mouth daily.    Marland Kitchen ENSURE (ENSURE) Take 237 mLs by mouth.    . ferrous sulfate 325 (65 FE) MG tablet Take 325 mg by mouth daily with breakfast.    . lidocaine-prilocaine (EMLA) cream APPLY TO AFFECTED AREA AS NEEDED 30 g 0  . LORazepam (ATIVAN) 0.5 MG tablet Take 1 tablet (0.5 mg total) by mouth every 6 (six) hours as needed for anxiety. 30 tablet 0  . oxyCODONE-acetaminophen (ROXICET) 5-325 MG per tablet Take 1-2 tablets by mouth every 4 (four) hours as needed. 50 tablet 0  . UNABLE TO FIND Apply 1 each topically as needed. 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  PHYSICAL EXAMINATION: ECOG PERFORMANCE STATUS: 1 - Symptomatic but completely ambulatory  Filed Vitals:   05/27/14 1011  BP: 138/78  Pulse: 79  Temp: 98.5 F (36.9 C)  Resp: 18   Filed Weights   05/27/14 1011  Weight: 180 lb 9.6 oz (81.92 kg)    GENERAL:alert, no distress and comfortable SKIN: skin color, texture, turgor  are normal, no rashes or significant lesions EYES: normal, Conjunctiva are pink and non-injected, sclera clear OROPHARYNX:no exudate, no erythema and lips, buccal mucosa, and tongue normal  NECK: supple, thyroid normal size, non-tender, without nodularity LYMPH:  no palpable lymphadenopathy in the cervical, axillary or inguinal LUNGS: clear to auscultation and percussion with normal breathing effort HEART: regular rate & rhythm and no murmurs and no lower extremity edema ABDOMEN:abdomen soft, non-tender and normal bowel sounds Musculoskeletal:no cyanosis of digits and no clubbing  NEURO: alert & oriented x 3 with fluent speech, grade 1 peripheral sensory neuropathy   LABORATORY DATA:  I have reviewed the data as listed   Chemistry      Component Value Date/Time   NA 142 05/27/2014 0950   K 4.3 05/27/2014 0950   CO2 26 05/27/2014 0950   BUN 10.3 05/27/2014 0950   CREATININE 0.8 05/27/2014 0950      Component Value Date/Time   CALCIUM 9.1 05/27/2014 0950   ALKPHOS 58 05/27/2014 0950   AST 18 05/27/2014 0950   ALT 30 05/27/2014 0950   BILITOT 0.22 05/27/2014 0950       Lab Results  Component Value Date   WBC 5.3 05/27/2014   HGB 10.7* 05/27/2014   HCT 33.8* 05/27/2014   MCV 90.4 05/27/2014   PLT 279 05/27/2014   NEUTROABS 3.6 05/27/2014    ASSESSMENT & PLAN:  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   Current Treatment: Patient completed neoadjuvant dose dense Adriamycin and Cytoxan. Weekly Taxol treatment started 04/08/2014, today is week 8/12 of Taxol  Chemotherapy related toxicities: 1. Alopecia 2. Chemotherapy related anemia: Being monitored 3. Loss of taste 4. Nail bed discoloration 5. 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 Chemotherapy  induced anemia grade 1: Being monitored 7. Nail discoloration especially the thumb and great toes   Patient denies any nausea vomiting. I reviewed her blood work and her counts are adequate to treat. Return to clinic 3 weeks but she will come weekly for Taxol treatments.    Orders Placed This Encounter  Procedures  . CBC with Differential    Standing Status: Future     Number of Occurrences:      Standing Expiration Date: 05/27/2015  . Comprehensive metabolic panel (Cmet) - [facility]    Standing Status: Future     Number of Occurrences:      Standing Expiration Date: 05/27/2015   The patient has a good understanding of the overall plan. she agrees with it. She will call with any problems that may develop before her next visit here.   Rulon Eisenmenger, MD

## 2014-05-27 NOTE — Assessment & Plan Note (Signed)
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   Current Treatment: Patient completed neoadjuvant dose dense Adriamycin and Cytoxan. Weekly Taxol treatment started 04/08/2014, today is week 8/12 of Taxol  Chemotherapy related toxicities: 1. Alopecia 2. Chemotherapy related anemia: Being monitored 3. Loss of taste 4. Nail bed discoloration 5. 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 Chemotherapy induced anemia grade 1: Being monitored  Patient denies any nausea vomiting. I reviewed her blood work and her counts are adequate to treat. Return to clinic 3 weeks but she will come weekly for Taxol treatments.

## 2014-05-28 NOTE — Progress Notes (Signed)
Met with pt in infusion. Pt relate she is doing well. Denies needs at this time. Encourage pt to reach out at anytime. Received verbal understanding.

## 2014-06-03 ENCOUNTER — Other Ambulatory Visit: Payer: BC Managed Care – PPO

## 2014-06-03 ENCOUNTER — Telehealth: Payer: Self-pay | Admitting: Hematology and Oncology

## 2014-06-03 ENCOUNTER — Ambulatory Visit: Payer: BC Managed Care – PPO

## 2014-06-10 ENCOUNTER — Other Ambulatory Visit: Payer: Medicaid Other

## 2014-06-10 ENCOUNTER — Other Ambulatory Visit (HOSPITAL_BASED_OUTPATIENT_CLINIC_OR_DEPARTMENT_OTHER): Payer: Medicaid Other

## 2014-06-10 ENCOUNTER — Ambulatory Visit (HOSPITAL_BASED_OUTPATIENT_CLINIC_OR_DEPARTMENT_OTHER): Payer: Medicaid Other

## 2014-06-10 DIAGNOSIS — C50211 Malignant neoplasm of upper-inner quadrant of right female breast: Secondary | ICD-10-CM

## 2014-06-10 DIAGNOSIS — Z5111 Encounter for antineoplastic chemotherapy: Secondary | ICD-10-CM

## 2014-06-10 DIAGNOSIS — C50811 Malignant neoplasm of overlapping sites of right female breast: Secondary | ICD-10-CM

## 2014-06-10 LAB — CBC WITH DIFFERENTIAL/PLATELET
BASO%: 0.8 % (ref 0.0–2.0)
BASOS ABS: 0 10*3/uL (ref 0.0–0.1)
EOS ABS: 0 10*3/uL (ref 0.0–0.5)
EOS%: 0.8 % (ref 0.0–7.0)
HCT: 36.1 % (ref 34.8–46.6)
HEMOGLOBIN: 11.3 g/dL — AB (ref 11.6–15.9)
LYMPH#: 1.2 10*3/uL (ref 0.9–3.3)
LYMPH%: 20.2 % (ref 14.0–49.7)
MCH: 27.6 pg (ref 25.1–34.0)
MCHC: 31.3 g/dL — ABNORMAL LOW (ref 31.5–36.0)
MCV: 88 fL (ref 79.5–101.0)
MONO#: 0.4 10*3/uL (ref 0.1–0.9)
MONO%: 6.4 % (ref 0.0–14.0)
NEUT%: 71.8 % (ref 38.4–76.8)
NEUTROS ABS: 4.1 10*3/uL (ref 1.5–6.5)
Platelets: 241 10*3/uL (ref 145–400)
RBC: 4.11 10*6/uL (ref 3.70–5.45)
RDW: 14.1 % (ref 11.2–14.5)
WBC: 5.7 10*3/uL (ref 3.9–10.3)

## 2014-06-10 LAB — COMPREHENSIVE METABOLIC PANEL (CC13)
ALT: 35 U/L (ref 0–55)
AST: 21 U/L (ref 5–34)
Albumin: 3.9 g/dL (ref 3.5–5.0)
Alkaline Phosphatase: 65 U/L (ref 40–150)
Anion Gap: 8 mEq/L (ref 3–11)
BUN: 8.4 mg/dL (ref 7.0–26.0)
CALCIUM: 9.5 mg/dL (ref 8.4–10.4)
CO2: 27 meq/L (ref 22–29)
Chloride: 106 mEq/L (ref 98–109)
Creatinine: 0.8 mg/dL (ref 0.6–1.1)
EGFR: >90 mL/min/{1.73_m2} (ref >90)
Glucose: 183 mg/dl — ABNORMAL HIGH (ref 70–140)
Potassium: 3.6 mEq/L (ref 3.5–5.1)
SODIUM: 141 meq/L (ref 136–145)
Total Bilirubin: 0.31 mg/dL (ref 0.20–1.20)
Total Protein: 7.3 g/dL (ref 6.4–8.3)

## 2014-06-10 MED ORDER — DEXAMETHASONE SODIUM PHOSPHATE 20 MG/5ML IJ SOLN
20.0000 mg | Freq: Once | INTRAMUSCULAR | Status: AC
  Administered 2014-06-10: 20 mg via INTRAVENOUS

## 2014-06-10 MED ORDER — ONDANSETRON 16 MG/50ML IVPB (CHCC)
16.0000 mg | Freq: Once | INTRAVENOUS | Status: AC
  Administered 2014-06-10: 16 mg via INTRAVENOUS

## 2014-06-10 MED ORDER — ONDANSETRON 16 MG/50ML IVPB (CHCC)
INTRAVENOUS | Status: AC
  Filled 2014-06-10: qty 16

## 2014-06-10 MED ORDER — HEPARIN SOD (PORK) LOCK FLUSH 100 UNIT/ML IV SOLN
500.0000 [IU] | Freq: Once | INTRAVENOUS | Status: AC | PRN
  Administered 2014-06-10: 500 [IU]
  Filled 2014-06-10: qty 5

## 2014-06-10 MED ORDER — PACLITAXEL CHEMO INJECTION 300 MG/50ML
80.0000 mg/m2 | Freq: Once | INTRAVENOUS | Status: AC
  Administered 2014-06-10: 156 mg via INTRAVENOUS
  Filled 2014-06-10: qty 26

## 2014-06-10 MED ORDER — SODIUM CHLORIDE 0.9 % IV SOLN
Freq: Once | INTRAVENOUS | Status: AC
  Administered 2014-06-10: 10:00:00 via INTRAVENOUS

## 2014-06-10 MED ORDER — FAMOTIDINE IN NACL 20-0.9 MG/50ML-% IV SOLN
INTRAVENOUS | Status: AC
  Filled 2014-06-10: qty 50

## 2014-06-10 MED ORDER — FAMOTIDINE IN NACL 20-0.9 MG/50ML-% IV SOLN
20.0000 mg | Freq: Once | INTRAVENOUS | Status: AC
  Administered 2014-06-10: 20 mg via INTRAVENOUS

## 2014-06-10 MED ORDER — SODIUM CHLORIDE 0.9 % IJ SOLN
10.0000 mL | INTRAMUSCULAR | Status: DC | PRN
  Administered 2014-06-10: 10 mL
  Filled 2014-06-10: qty 10

## 2014-06-10 MED ORDER — DIPHENHYDRAMINE HCL 50 MG/ML IJ SOLN
INTRAMUSCULAR | Status: AC
  Filled 2014-06-10: qty 1

## 2014-06-10 MED ORDER — DEXAMETHASONE SODIUM PHOSPHATE 20 MG/5ML IJ SOLN
INTRAMUSCULAR | Status: AC
  Filled 2014-06-10: qty 5

## 2014-06-10 MED ORDER — DIPHENHYDRAMINE HCL 50 MG/ML IJ SOLN
50.0000 mg | Freq: Once | INTRAMUSCULAR | Status: AC
  Administered 2014-06-10: 50 mg via INTRAVENOUS

## 2014-06-10 NOTE — Patient Instructions (Signed)
[facility] Discharge Instructions for Patients Receiving Chemotherapy  Today you received the following chemotherapy agents: Taxol.  To help prevent nausea and vomiting after your treatment, we encourage you to take your nausea medication;  Lorazepam 0.5 mg as directed.   If you develop nausea and vomiting that is not controlled by your nausea medication, call the clinic.   BELOW ARE SYMPTOMS THAT SHOULD BE REPORTED IMMEDIATELY:  *FEVER GREATER THAN 100.5 F  *CHILLS WITH OR WITHOUT FEVER  NAUSEA AND VOMITING THAT IS NOT CONTROLLED WITH YOUR NAUSEA MEDICATION  *UNUSUAL SHORTNESS OF BREATH  *UNUSUAL BRUISING OR BLEEDING  TENDERNESS IN MOUTH AND THROAT WITH OR WITHOUT PRESENCE OF ULCERS  *URINARY PROBLEMS  *BOWEL PROBLEMS  UNUSUAL RASH Items with * indicate a potential emergency and should be followed up as soon as possible.  Feel free to call the clinic you have any questions or concerns. The clinic phone number is [phone].

## 2014-06-17 ENCOUNTER — Ambulatory Visit (HOSPITAL_BASED_OUTPATIENT_CLINIC_OR_DEPARTMENT_OTHER): Payer: Medicaid Other

## 2014-06-17 ENCOUNTER — Other Ambulatory Visit: Payer: Medicaid Other

## 2014-06-17 ENCOUNTER — Other Ambulatory Visit: Payer: Self-pay | Admitting: *Deleted

## 2014-06-17 ENCOUNTER — Other Ambulatory Visit (HOSPITAL_BASED_OUTPATIENT_CLINIC_OR_DEPARTMENT_OTHER): Payer: Medicaid Other

## 2014-06-17 ENCOUNTER — Ambulatory Visit (HOSPITAL_BASED_OUTPATIENT_CLINIC_OR_DEPARTMENT_OTHER): Payer: Medicaid Other | Admitting: Hematology and Oncology

## 2014-06-17 ENCOUNTER — Telehealth: Payer: Self-pay | Admitting: Hematology and Oncology

## 2014-06-17 ENCOUNTER — Telehealth: Payer: Self-pay | Admitting: *Deleted

## 2014-06-17 VITALS — BP 134/66 | HR 18 | Temp 98.2°F | Resp 18 | Ht 67.0 in | Wt 181.5 lb

## 2014-06-17 DIAGNOSIS — C50211 Malignant neoplasm of upper-inner quadrant of right female breast: Secondary | ICD-10-CM

## 2014-06-17 DIAGNOSIS — Z5111 Encounter for antineoplastic chemotherapy: Secondary | ICD-10-CM

## 2014-06-17 DIAGNOSIS — G620 Drug-induced polyneuropathy: Secondary | ICD-10-CM

## 2014-06-17 DIAGNOSIS — D6481 Anemia due to antineoplastic chemotherapy: Secondary | ICD-10-CM

## 2014-06-17 DIAGNOSIS — C773 Secondary and unspecified malignant neoplasm of axilla and upper limb lymph nodes: Secondary | ICD-10-CM

## 2014-06-17 DIAGNOSIS — C50811 Malignant neoplasm of overlapping sites of right female breast: Secondary | ICD-10-CM

## 2014-06-17 LAB — COMPREHENSIVE METABOLIC PANEL (CC13)
ALBUMIN: 3.7 g/dL (ref 3.5–5.0)
ALK PHOS: 60 U/L (ref 40–150)
ALT: 26 U/L (ref 0–55)
AST: 16 U/L (ref 5–34)
Anion Gap: 8 mEq/L (ref 3–11)
BUN: 11.7 mg/dL (ref 7.0–26.0)
CALCIUM: 9 mg/dL (ref 8.4–10.4)
CHLORIDE: 109 meq/L (ref 98–109)
CO2: 23 mEq/L (ref 22–29)
Creatinine: 0.7 mg/dL (ref 0.6–1.1)
EGFR: >90 mL/min/{1.73_m2} (ref >90)
Glucose: 121 mg/dl (ref 70–140)
POTASSIUM: 4 meq/L (ref 3.5–5.1)
SODIUM: 141 meq/L (ref 136–145)
TOTAL PROTEIN: 6.9 g/dL (ref 6.4–8.3)
Total Bilirubin: 0.23 mg/dL (ref 0.20–1.20)

## 2014-06-17 LAB — CBC WITH DIFFERENTIAL/PLATELET
BASO%: 0.2 % (ref 0.0–2.0)
BASOS ABS: 0 10*3/uL (ref 0.0–0.1)
EOS%: 2.1 % (ref 0.0–7.0)
Eosinophils Absolute: 0.1 10*3/uL (ref 0.0–0.5)
HCT: 34.8 % (ref 34.8–46.6)
HEMOGLOBIN: 11.2 g/dL — AB (ref 11.6–15.9)
LYMPH#: 1.3 10*3/uL (ref 0.9–3.3)
LYMPH%: 22.4 % (ref 14.0–49.7)
MCH: 28.6 pg (ref 25.1–34.0)
MCHC: 32.2 g/dL (ref 31.5–36.0)
MCV: 89 fL (ref 79.5–101.0)
MONO#: 0.3 10*3/uL (ref 0.1–0.9)
MONO%: 4.8 % (ref 0.0–14.0)
NEUT#: 4.1 10*3/uL (ref 1.5–6.5)
NEUT%: 70.5 % (ref 38.4–76.8)
Platelets: 235 10*3/uL (ref 145–400)
RBC: 3.91 10*6/uL (ref 3.70–5.45)
RDW: 13.7 % (ref 11.2–14.5)
WBC: 5.8 10*3/uL (ref 3.9–10.3)

## 2014-06-17 MED ORDER — FAMOTIDINE IN NACL 20-0.9 MG/50ML-% IV SOLN
20.0000 mg | Freq: Once | INTRAVENOUS | Status: AC
  Administered 2014-06-17: 20 mg via INTRAVENOUS

## 2014-06-17 MED ORDER — DEXAMETHASONE SODIUM PHOSPHATE 20 MG/5ML IJ SOLN
20.0000 mg | Freq: Once | INTRAMUSCULAR | Status: AC
  Administered 2014-06-17: 20 mg via INTRAVENOUS

## 2014-06-17 MED ORDER — ONDANSETRON 16 MG/50ML IVPB (CHCC)
INTRAVENOUS | Status: AC
  Filled 2014-06-17: qty 16

## 2014-06-17 MED ORDER — SODIUM CHLORIDE 0.9 % IV SOLN
Freq: Once | INTRAVENOUS | Status: AC
Start: 2014-06-17 — End: 2014-06-17
  Administered 2014-06-17: 10:00:00 via INTRAVENOUS

## 2014-06-17 MED ORDER — FAMOTIDINE IN NACL 20-0.9 MG/50ML-% IV SOLN
INTRAVENOUS | Status: AC
  Filled 2014-06-17: qty 50

## 2014-06-17 MED ORDER — HEPARIN SOD (PORK) LOCK FLUSH 100 UNIT/ML IV SOLN
500.0000 [IU] | Freq: Once | INTRAVENOUS | Status: AC | PRN
  Administered 2014-06-17: 500 [IU]
  Filled 2014-06-17: qty 5

## 2014-06-17 MED ORDER — SODIUM CHLORIDE 0.9 % IJ SOLN
10.0000 mL | INTRAMUSCULAR | Status: DC | PRN
  Administered 2014-06-17: 10 mL
  Filled 2014-06-17: qty 10

## 2014-06-17 MED ORDER — DIPHENHYDRAMINE HCL 50 MG/ML IJ SOLN
INTRAMUSCULAR | Status: AC
  Filled 2014-06-17: qty 1

## 2014-06-17 MED ORDER — DEXAMETHASONE SODIUM PHOSPHATE 20 MG/5ML IJ SOLN
INTRAMUSCULAR | Status: AC
  Filled 2014-06-17: qty 5

## 2014-06-17 MED ORDER — PACLITAXEL CHEMO INJECTION 300 MG/50ML
80.0000 mg/m2 | Freq: Once | INTRAVENOUS | Status: AC
  Administered 2014-06-17: 156 mg via INTRAVENOUS
  Filled 2014-06-17: qty 26

## 2014-06-17 MED ORDER — DIPHENHYDRAMINE HCL 50 MG/ML IJ SOLN
50.0000 mg | Freq: Once | INTRAMUSCULAR | Status: AC
  Administered 2014-06-17: 50 mg via INTRAVENOUS

## 2014-06-17 MED ORDER — ONDANSETRON 16 MG/50ML IVPB (CHCC)
16.0000 mg | Freq: Once | INTRAVENOUS | Status: AC
  Administered 2014-06-17: 16 mg via INTRAVENOUS

## 2014-06-17 NOTE — Progress Notes (Signed)
Patient Care Team: Eloise Levels, NP as PCP - General (Nurse Practitioner) Autumn Messing III, MD as Consulting Physician (General Surgery) Rulon Eisenmenger, MD as Consulting Physician (Hematology and Oncology) [name], MD as Attending Physician (Radiation Oncology)  DIAGNOSIS: Breast cancer of upper-inner quadrant of right female breast   Staging form: Breast, AJCC 7th Edition     Clinical: Stage IIB (T2, N1, cM0) - Unsigned       Staging comments: Staged at breast conference 01/20/14.      Pathologic: No stage assigned - Unsigned   SUMMARY OF ONCOLOGIC HISTORY:   Breast cancer of upper-inner quadrant of right female breast   01/12/2014 Mammogram Right breast 2:00 position 1.6 x 1.9 cm and the 12:00 position 0.5 x 0.9 cm distance between the 2 was 4.5 cm, right axillary lymph node enlargement   01/12/2014 Initial Diagnosis All 3 biopsies were IDC with DCIS ER percent PR 7200% Ki-67 85% HER-2 negative: Right axillary lymph node also positive for metastatic cancer ER positive HER-2 negative Ki-67 80% grade 2   01/18/2014 Breast MRI Right breast 12:00 position 2.8 x 2 x 2.2 cm: 2:00 position 1.3 x 0.8 x 0.5 cm, right axilla multiple enlarged lymph nodes 1 cm in size, right retropectoral lymph node 0.6 cm   02/11/2014 -  Neo-Adjuvant Chemotherapy Doxorubicin and Cyclophosphamide X 4 followed by Taxol weekly x12     CHIEF COMPLIANT: Week 10 Taxol  INTERVAL HISTORY: Debbie Martinez is a 39 year old lady with above-mentioned history of right-sided breast cancer currently neoadjuvant chemotherapy. Today is week 10 of Taxol. She has 2 more Taxol single. She is very excited about it. She had 2 episodes of nausea and vomiting after last cycle of treatment. She does not have any nausea currently. She denies any neuropathy symptoms.  REVIEW OF SYSTEMS:   Constitutional: Denies fevers, chills or abnormal weight loss Eyes: Denies blurriness of vision Ears, nose, mouth, throat, and face: Denies  mucositis or sore throat Respiratory: Denies cough, dyspnea or wheezes Cardiovascular: Denies palpitation, chest discomfort or lower extremity swelling Gastrointestinal:  Denies nausea, heartburn or change in bowel habits Skin: Denies abnormal skin rashes Lymphatics: Denies new lymphadenopathy or easy bruising Neurological:Denies numbness, tingling or new weaknesses Behavioral/Psych: Mood is stable, no new changes  All other systems were reviewed with the patient and are negative.  I have reviewed the past medical history, past surgical history, social history and family history with the patient and they are unchanged from previous note.  ALLERGIES:  is allergic to lisinopril.  MEDICATIONS:  Current Outpatient Prescriptions  Medication Sig Dispense Refill  . atenolol (TENORMIN) 50 MG tablet Take 50 mg by mouth daily.    Marland Kitchen ENSURE (ENSURE) Take 237 mLs by mouth.    . ferrous sulfate 325 (65 FE) MG tablet Take 325 mg by mouth daily with breakfast.    . lidocaine-prilocaine (EMLA) cream APPLY TO AFFECTED AREA AS NEEDED 30 g 0  . LORazepam (ATIVAN) 0.5 MG tablet Take 1 tablet (0.5 mg total) by mouth every 6 (six) hours as needed for anxiety. 30 tablet 0  . oxyCODONE-acetaminophen (ROXICET) 5-325 MG per tablet Take 1-2 tablets by mouth every 4 (four) hours as needed. 50 tablet 0  . UNABLE TO FIND Apply 1 each topically as needed. 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    PHYSICAL EXAMINATION: ECOG PERFORMANCE STATUS: 1 - Symptomatic but completely ambulatory  Filed Vitals:  06/17/14 0859  BP: 134/66  Pulse: 18  Temp: 98.2 F (36.8 C)  Resp: 18   Filed Weights   06/17/14 0859  Weight: 181 lb 8 oz (82.328 kg)    GENERAL:alert, no distress and comfortable SKIN: skin color, texture, turgor are normal, no rashes or significant lesions EYES: normal, Conjunctiva are pink and  non-injected, sclera clear OROPHARYNX:no exudate, no erythema and lips, buccal mucosa, and tongue normal  NECK: supple, thyroid normal size, non-tender, without nodularity LYMPH:  no palpable lymphadenopathy in the cervical, axillary or inguinal LUNGS: clear to auscultation and percussion with normal breathing effort HEART: regular rate & rhythm and no murmurs and no lower extremity edema ABDOMEN:abdomen soft, non-tender and normal bowel sounds Musculoskeletal:no cyanosis of digits and no clubbing  NEURO: alert & oriented x 3 with fluent speech, no focal motor/sensory deficits   LABORATORY DATA:  I have reviewed the data as listed   Chemistry      Component Value Date/Time   NA 141 06/17/2014 0833   K 4.0 06/17/2014 0833   CO2 23 06/17/2014 0833   BUN 11.7 06/17/2014 0833   CREATININE 0.7 06/17/2014 0833      Component Value Date/Time   CALCIUM 9.0 06/17/2014 0833   ALKPHOS 60 06/17/2014 0833   AST 16 06/17/2014 0833   ALT 26 06/17/2014 0833   BILITOT 0.23 06/17/2014 0833       Lab Results  Component Value Date   WBC 5.8 06/17/2014   HGB 11.2* 06/17/2014   HCT 34.8 06/17/2014   MCV 89.0 06/17/2014   PLT 235 06/17/2014   NEUTROABS 4.1 06/17/2014   ASSESSMENT & PLAN:  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   Current Treatment: Patient completed neoadjuvant dose dense Adriamycin and Cytoxan. Weekly Taxol treatment started 04/08/2014, today is week 10/12 of Taxol  Chemotherapy related toxicities: 1. Alopecia 2. Chemotherapy related anemia: Being monitored 3. Loss of taste 4. Nail bed discoloration 5. 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 Chemotherapy induced anemia grade 1: Being monitored 7. Nail discoloration especially the thumb and  great toes   Patient denies any nausea vomiting. I reviewed her blood work and her counts are adequate to treat.  Plan:  1. Breast MRI post neoadj chemo 2. Tumor board presentation 3. Followed by surgery    Orders Placed This Encounter  Procedures  . MR Breast Bilateral W Contrast    Standing Status: Future     Number of Occurrences:      Standing Expiration Date: 06/17/2015    Order Specific Question:  Reason for Exam (SYMPTOM  OR DIAGNOSIS REQUIRED)    Answer:  S/P neoadjuvant chemo for breast cancer    Order Specific Question:  Preferred imaging location?    Answer:  [facility]    Order Specific Question:  Does the patient have a pacemaker or implanted devices?    Answer:  No    Order Specific Question:  What is the patient's sedation requirement?    Answer:  No Sedation  . MR Breast Bilateral Wo Contrast    Standing Status: Future     Number of Occurrences:      Standing Expiration Date: 06/17/2015    Order Specific Question:  Reason for Exam (SYMPTOM  OR DIAGNOSIS REQUIRED)    Answer:  S/P neoadjuvant chemo for breast cancer    Order Specific Question:  Preferred imaging location?    Answer:  [facility]    Order Specific Question:  Does the patient have a pacemaker or implanted devices?    Answer:  No    Order Specific Question:  What is the patient's sedation requirement?    Answer:  No Sedation   The patient has a good understanding of the overall plan. she agrees with it. She will call with any problems that may develop before her next visit here.   Rulon Eisenmenger, MD

## 2014-06-17 NOTE — Assessment & Plan Note (Signed)
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   Current Treatment: Patient completed neoadjuvant dose dense Adriamycin and Cytoxan. Weekly Taxol treatment started 04/08/2014, today is week 10/12 of Taxol  Chemotherapy related toxicities: 1. Alopecia 2. Chemotherapy related anemia: Being monitored 3. Loss of taste 4. Nail bed discoloration 5. 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 Chemotherapy induced anemia grade 1: Being monitored 7. Nail discoloration especially the thumb and great toes   Patient denies any nausea vomiting. I reviewed her blood work and her counts are adequate to treat.  Plan:  1. Breast MRI post neoadj chemo 2. Tumor board presentation 3. Followed by surgery

## 2014-06-17 NOTE — Patient Instructions (Signed)
[facility] Discharge Instructions for Patients Receiving Chemotherapy  Today you received the following chemotherapy agents: Taxol  To help prevent nausea and vomiting after your treatment, we encourage you to take your nausea medication as prescribed by your physician.  If you develop nausea and vomiting that is not controlled by your nausea medication, call the clinic.   BELOW ARE SYMPTOMS THAT SHOULD BE REPORTED IMMEDIATELY:  *FEVER GREATER THAN 100.5 F  *CHILLS WITH OR WITHOUT FEVER  NAUSEA AND VOMITING THAT IS NOT CONTROLLED WITH YOUR NAUSEA MEDICATION  *UNUSUAL SHORTNESS OF BREATH  *UNUSUAL BRUISING OR BLEEDING  TENDERNESS IN MOUTH AND THROAT WITH OR WITHOUT PRESENCE OF ULCERS  *URINARY PROBLEMS  *BOWEL PROBLEMS  UNUSUAL RASH Items with * indicate a potential emergency and should be followed up as soon as possible.  Feel free to call the clinic you have any questions or concerns. The clinic phone number is [phone].    

## 2014-06-17 NOTE — Telephone Encounter (Signed)
Per staff message and POF I have scheduled appts. Advised scheduler of appts. JMW  

## 2014-06-17 NOTE — Telephone Encounter (Signed)
perpof ot sch pt appt-sch-sent MW email to sch pt trmt-gave pt # to [place] to sch [facility] MRI-pt aware of appts

## 2014-06-23 ENCOUNTER — Other Ambulatory Visit: Payer: Self-pay

## 2014-06-23 DIAGNOSIS — C50211 Malignant neoplasm of upper-inner quadrant of right female breast: Secondary | ICD-10-CM

## 2014-06-24 ENCOUNTER — Other Ambulatory Visit (HOSPITAL_BASED_OUTPATIENT_CLINIC_OR_DEPARTMENT_OTHER): Payer: Medicaid Other

## 2014-06-24 ENCOUNTER — Ambulatory Visit (HOSPITAL_BASED_OUTPATIENT_CLINIC_OR_DEPARTMENT_OTHER): Payer: Medicaid Other

## 2014-06-24 DIAGNOSIS — C50811 Malignant neoplasm of overlapping sites of right female breast: Secondary | ICD-10-CM

## 2014-06-24 DIAGNOSIS — Z5111 Encounter for antineoplastic chemotherapy: Secondary | ICD-10-CM

## 2014-06-24 DIAGNOSIS — D6481 Anemia due to antineoplastic chemotherapy: Secondary | ICD-10-CM

## 2014-06-24 DIAGNOSIS — C50211 Malignant neoplasm of upper-inner quadrant of right female breast: Secondary | ICD-10-CM

## 2014-06-24 LAB — CBC WITH DIFFERENTIAL/PLATELET
BASO%: 0.2 % (ref 0.0–2.0)
Basophils Absolute: 0 10*3/uL (ref 0.0–0.1)
EOS%: 1.7 % (ref 0.0–7.0)
Eosinophils Absolute: 0.1 10*3/uL (ref 0.0–0.5)
HEMATOCRIT: 36.9 % (ref 34.8–46.6)
HGB: 11.6 g/dL (ref 11.6–15.9)
LYMPH#: 1.2 10*3/uL (ref 0.9–3.3)
LYMPH%: 25.8 % (ref 14.0–49.7)
MCH: 28 pg (ref 25.1–34.0)
MCHC: 31.4 g/dL — AB (ref 31.5–36.0)
MCV: 89.1 fL (ref 79.5–101.0)
MONO#: 0.2 10*3/uL (ref 0.1–0.9)
MONO%: 4.2 % (ref 0.0–14.0)
NEUT#: 3.2 10*3/uL (ref 1.5–6.5)
NEUT%: 68.1 % (ref 38.4–76.8)
Platelets: 251 10*3/uL (ref 145–400)
RBC: 4.14 10*6/uL (ref 3.70–5.45)
RDW: 14 % (ref 11.2–14.5)
WBC: 4.7 10*3/uL (ref 3.9–10.3)

## 2014-06-24 LAB — COMPREHENSIVE METABOLIC PANEL (CC13)
ALK PHOS: 66 U/L (ref 40–150)
ALT: 30 U/L (ref 0–55)
ANION GAP: 10 meq/L (ref 3–11)
AST: 19 U/L (ref 5–34)
Albumin: 3.9 g/dL (ref 3.5–5.0)
BUN: 11.7 mg/dL (ref 7.0–26.0)
CALCIUM: 9.6 mg/dL (ref 8.4–10.4)
CO2: 25 mEq/L (ref 22–29)
Chloride: 107 mEq/L (ref 98–109)
Creatinine: 0.8 mg/dL (ref 0.6–1.1)
EGFR: >90 mL/min/{1.73_m2} (ref >90)
GLUCOSE: 200 mg/dL — AB (ref 70–140)
Potassium: 3.7 mEq/L (ref 3.5–5.1)
SODIUM: 142 meq/L (ref 136–145)
Total Bilirubin: 0.35 mg/dL (ref 0.20–1.20)
Total Protein: 7.3 g/dL (ref 6.4–8.3)

## 2014-06-24 MED ORDER — DIPHENHYDRAMINE HCL 50 MG/ML IJ SOLN
INTRAMUSCULAR | Status: AC
Start: 2014-06-24 — End: 2014-06-24
  Filled 2014-06-24: qty 1

## 2014-06-24 MED ORDER — HEPARIN SOD (PORK) LOCK FLUSH 100 UNIT/ML IV SOLN
500.0000 [IU] | Freq: Once | INTRAVENOUS | Status: AC | PRN
  Administered 2014-06-24: 500 [IU]
  Filled 2014-06-24: qty 5

## 2014-06-24 MED ORDER — ONDANSETRON 16 MG/50ML IVPB (CHCC)
INTRAVENOUS | Status: AC
  Filled 2014-06-24: qty 16

## 2014-06-24 MED ORDER — ONDANSETRON 16 MG/50ML IVPB (CHCC)
16.0000 mg | Freq: Once | INTRAVENOUS | Status: AC
  Administered 2014-06-24: 16 mg via INTRAVENOUS

## 2014-06-24 MED ORDER — FAMOTIDINE IN NACL 20-0.9 MG/50ML-% IV SOLN
INTRAVENOUS | Status: AC
  Filled 2014-06-24: qty 50

## 2014-06-24 MED ORDER — DIPHENHYDRAMINE HCL 50 MG/ML IJ SOLN
50.0000 mg | Freq: Once | INTRAMUSCULAR | Status: AC
  Administered 2014-06-24: 50 mg via INTRAVENOUS

## 2014-06-24 MED ORDER — SODIUM CHLORIDE 0.9 % IV SOLN
Freq: Once | INTRAVENOUS | Status: AC
  Administered 2014-06-24: 10:00:00 via INTRAVENOUS

## 2014-06-24 MED ORDER — FAMOTIDINE IN NACL 20-0.9 MG/50ML-% IV SOLN
20.0000 mg | Freq: Once | INTRAVENOUS | Status: AC
  Administered 2014-06-24: 20 mg via INTRAVENOUS

## 2014-06-24 MED ORDER — SODIUM CHLORIDE 0.9 % IJ SOLN
10.0000 mL | INTRAMUSCULAR | Status: DC | PRN
  Administered 2014-06-24: 10 mL
  Filled 2014-06-24: qty 10

## 2014-06-24 MED ORDER — PACLITAXEL CHEMO INJECTION 300 MG/50ML
80.0000 mg/m2 | Freq: Once | INTRAVENOUS | Status: AC
  Administered 2014-06-24: 156 mg via INTRAVENOUS
  Filled 2014-06-24: qty 26

## 2014-06-24 MED ORDER — DEXAMETHASONE SODIUM PHOSPHATE 20 MG/5ML IJ SOLN
INTRAMUSCULAR | Status: AC
  Filled 2014-06-24: qty 5

## 2014-06-24 MED ORDER — DEXAMETHASONE SODIUM PHOSPHATE 20 MG/5ML IJ SOLN
20.0000 mg | Freq: Once | INTRAMUSCULAR | Status: AC
  Administered 2014-06-24: 20 mg via INTRAVENOUS

## 2014-06-24 NOTE — Patient Instructions (Signed)
[facility] Discharge Instructions for Patients Receiving Chemotherapy  Today you received the following chemotherapy agent: Taxol   To help prevent nausea and vomiting after your treatment, we encourage you to take your nausea medication as prescribed.    If you develop nausea and vomiting that is not controlled by your nausea medication, call the clinic.   BELOW ARE SYMPTOMS THAT SHOULD BE REPORTED IMMEDIATELY:  *FEVER GREATER THAN 100.5 F  *CHILLS WITH OR WITHOUT FEVER  NAUSEA AND VOMITING THAT IS NOT CONTROLLED WITH YOUR NAUSEA MEDICATION  *UNUSUAL SHORTNESS OF BREATH  *UNUSUAL BRUISING OR BLEEDING  TENDERNESS IN MOUTH AND THROAT WITH OR WITHOUT PRESENCE OF ULCERS  *URINARY PROBLEMS  *BOWEL PROBLEMS  UNUSUAL RASH Items with * indicate a potential emergency and should be followed up as soon as possible.  Feel free to call the clinic you have any questions or concerns. The clinic phone number is [phone].    

## 2014-06-30 ENCOUNTER — Other Ambulatory Visit: Payer: Self-pay

## 2014-06-30 ENCOUNTER — Other Ambulatory Visit: Payer: Self-pay | Admitting: Hematology and Oncology

## 2014-06-30 DIAGNOSIS — C50211 Malignant neoplasm of upper-inner quadrant of right female breast: Secondary | ICD-10-CM

## 2014-07-01 ENCOUNTER — Other Ambulatory Visit (HOSPITAL_BASED_OUTPATIENT_CLINIC_OR_DEPARTMENT_OTHER): Payer: Medicaid Other

## 2014-07-01 ENCOUNTER — Telehealth: Payer: Self-pay | Admitting: Hematology and Oncology

## 2014-07-01 ENCOUNTER — Ambulatory Visit (HOSPITAL_BASED_OUTPATIENT_CLINIC_OR_DEPARTMENT_OTHER): Payer: Medicaid Other

## 2014-07-01 ENCOUNTER — Ambulatory Visit (HOSPITAL_BASED_OUTPATIENT_CLINIC_OR_DEPARTMENT_OTHER): Payer: Medicaid Other | Admitting: Hematology and Oncology

## 2014-07-01 ENCOUNTER — Other Ambulatory Visit: Payer: Medicaid Other

## 2014-07-01 ENCOUNTER — Encounter: Payer: Self-pay | Admitting: *Deleted

## 2014-07-01 VITALS — BP 151/102 | HR 99 | Temp 98.4°F | Resp 18 | Ht 67.0 in | Wt 178.9 lb

## 2014-07-01 DIAGNOSIS — C773 Secondary and unspecified malignant neoplasm of axilla and upper limb lymph nodes: Secondary | ICD-10-CM

## 2014-07-01 DIAGNOSIS — G620 Drug-induced polyneuropathy: Secondary | ICD-10-CM

## 2014-07-01 DIAGNOSIS — Z170 Estrogen receptor positive status [ER+]: Secondary | ICD-10-CM

## 2014-07-01 DIAGNOSIS — D6481 Anemia due to antineoplastic chemotherapy: Secondary | ICD-10-CM

## 2014-07-01 DIAGNOSIS — C50211 Malignant neoplasm of upper-inner quadrant of right female breast: Secondary | ICD-10-CM

## 2014-07-01 DIAGNOSIS — C50811 Malignant neoplasm of overlapping sites of right female breast: Secondary | ICD-10-CM

## 2014-07-01 DIAGNOSIS — L658 Other specified nonscarring hair loss: Secondary | ICD-10-CM

## 2014-07-01 DIAGNOSIS — C50911 Malignant neoplasm of unspecified site of right female breast: Secondary | ICD-10-CM

## 2014-07-01 DIAGNOSIS — Z5111 Encounter for antineoplastic chemotherapy: Secondary | ICD-10-CM

## 2014-07-01 DIAGNOSIS — L608 Other nail disorders: Secondary | ICD-10-CM

## 2014-07-01 LAB — CBC WITH DIFFERENTIAL/PLATELET
BASO%: 0.2 % (ref 0.0–2.0)
BASOS ABS: 0 10*3/uL (ref 0.0–0.1)
EOS ABS: 0.1 10*3/uL (ref 0.0–0.5)
EOS%: 1.2 % (ref 0.0–7.0)
HEMATOCRIT: 36.3 % (ref 34.8–46.6)
HGB: 11.5 g/dL — ABNORMAL LOW (ref 11.6–15.9)
LYMPH%: 26.4 % (ref 14.0–49.7)
MCH: 28.2 pg (ref 25.1–34.0)
MCHC: 31.7 g/dL (ref 31.5–36.0)
MCV: 89 fL (ref 79.5–101.0)
MONO#: 0.3 10*3/uL (ref 0.1–0.9)
MONO%: 5.6 % (ref 0.0–14.0)
NEUT#: 3.3 10*3/uL (ref 1.5–6.5)
NEUT%: 66.6 % (ref 38.4–76.8)
Platelets: 258 10*3/uL (ref 145–400)
RBC: 4.08 10*6/uL (ref 3.70–5.45)
RDW: 14.2 % (ref 11.2–14.5)
WBC: 5 10*3/uL (ref 3.9–10.3)
lymph#: 1.3 10*3/uL (ref 0.9–3.3)

## 2014-07-01 LAB — COMPREHENSIVE METABOLIC PANEL (CC13)
ALBUMIN: 3.7 g/dL (ref 3.5–5.0)
ALT: 35 U/L (ref 0–55)
AST: 19 U/L (ref 5–34)
Alkaline Phosphatase: 63 U/L (ref 40–150)
Anion Gap: 10 mEq/L (ref 3–11)
BUN: 12.3 mg/dL (ref 7.0–26.0)
CHLORIDE: 108 meq/L (ref 98–109)
CO2: 24 mEq/L (ref 22–29)
CREATININE: 0.8 mg/dL (ref 0.6–1.1)
Calcium: 9.2 mg/dL (ref 8.4–10.4)
EGFR: >90 mL/min/{1.73_m2} (ref >90)
Glucose: 153 mg/dl — ABNORMAL HIGH (ref 70–140)
Potassium: 3.9 mEq/L (ref 3.5–5.1)
Sodium: 142 mEq/L (ref 136–145)
Total Bilirubin: 0.27 mg/dL (ref 0.20–1.20)
Total Protein: 7.1 g/dL (ref 6.4–8.3)

## 2014-07-01 MED ORDER — PACLITAXEL CHEMO INJECTION 300 MG/50ML
80.0000 mg/m2 | Freq: Once | INTRAVENOUS | Status: AC
  Administered 2014-07-01: 156 mg via INTRAVENOUS
  Filled 2014-07-01: qty 26

## 2014-07-01 MED ORDER — SODIUM CHLORIDE 0.9 % IV SOLN
Freq: Once | INTRAVENOUS | Status: AC
  Administered 2014-07-01: 10:00:00 via INTRAVENOUS
  Filled 2014-07-01: qty 8

## 2014-07-01 MED ORDER — FAMOTIDINE IN NACL 20-0.9 MG/50ML-% IV SOLN
INTRAVENOUS | Status: AC
  Filled 2014-07-01: qty 50

## 2014-07-01 MED ORDER — SODIUM CHLORIDE 0.9 % IV SOLN
50.0000 mg | Freq: Once | INTRAVENOUS | Status: AC
  Administered 2014-07-01: 50 mg via INTRAVENOUS
  Filled 2014-07-01: qty 1

## 2014-07-01 MED ORDER — DIPHENHYDRAMINE HCL 50 MG/ML IJ SOLN: 50.0000 mg | Freq: Once | INTRAMUSCULAR | Status: DC

## 2014-07-01 MED ORDER — FAMOTIDINE IN NACL 20-0.9 MG/50ML-% IV SOLN
20.0000 mg | Freq: Once | INTRAVENOUS | Status: AC
  Administered 2014-07-01: 20 mg via INTRAVENOUS

## 2014-07-01 MED ORDER — SODIUM CHLORIDE 0.9 % IV SOLN
Freq: Once | INTRAVENOUS | Status: AC
  Administered 2014-07-01: 10:00:00 via INTRAVENOUS

## 2014-07-01 MED ORDER — SODIUM CHLORIDE 0.9 % IJ SOLN
10.0000 mL | INTRAMUSCULAR | Status: DC | PRN
Start: 2014-07-01 — End: 2014-07-01
  Administered 2014-07-01: 10 mL
  Filled 2014-07-01: qty 10

## 2014-07-01 MED ORDER — HEPARIN SOD (PORK) LOCK FLUSH 100 UNIT/ML IV SOLN
500.0000 [IU] | Freq: Once | INTRAVENOUS | Status: AC | PRN
  Administered 2014-07-01: 500 [IU]
  Filled 2014-07-01: qty 5

## 2014-07-01 NOTE — Patient Instructions (Signed)
[facility] Discharge Instructions for Patients Receiving Chemotherapy  Today you received the following chemotherapy agents Taxol  To help prevent nausea and vomiting after your treatment, we encourage you to take your nausea medication    If you develop nausea and vomiting that is not controlled by your nausea medication, call the clinic.   BELOW ARE SYMPTOMS THAT SHOULD BE REPORTED IMMEDIATELY:  *FEVER GREATER THAN 100.5 F  *CHILLS WITH OR WITHOUT FEVER  NAUSEA AND VOMITING THAT IS NOT CONTROLLED WITH YOUR NAUSEA MEDICATION  *UNUSUAL SHORTNESS OF BREATH  *UNUSUAL BRUISING OR BLEEDING  TENDERNESS IN MOUTH AND THROAT WITH OR WITHOUT PRESENCE OF ULCERS  *URINARY PROBLEMS  *BOWEL PROBLEMS  UNUSUAL RASH Items with * indicate a potential emergency and should be followed up as soon as possible.  Feel free to call the clinic you have any questions or concerns. The clinic phone number is [phone].    

## 2014-07-01 NOTE — Progress Notes (Signed)
Patient Care Team: Eloise Levels, NP as PCP - General (Nurse Practitioner) Autumn Messing III, MD as Consulting Physician (General Surgery) [name], MD as Consulting Physician (Hematology and Oncology) [name], MD as Attending Physician (Radiation Oncology)  DIAGNOSIS: Breast cancer of upper-inner quadrant of right female breast   Staging form: Breast, AJCC 7th Edition     Clinical: Stage IIB (T2, N1, cM0) - Unsigned       Staging comments: Staged at breast conference 01/20/14.      Pathologic: No stage assigned - Unsigned   SUMMARY OF ONCOLOGIC HISTORY:   Breast cancer of upper-inner quadrant of right female breast   01/12/2014 Mammogram Right breast 2:00 position 1.6 x 1.9 cm and the 12:00 position 0.5 x 0.9 cm distance between the 2 was 4.5 cm, right axillary lymph node enlargement   01/12/2014 Initial Diagnosis All 3 biopsies were IDC with DCIS ER percent PR 7200% Ki-67 85% HER-2 negative: Right axillary lymph node also positive for metastatic cancer ER positive HER-2 negative Ki-67 80% grade 2   01/18/2014 Breast MRI Right breast 12:00 position 2.8 x 2 x 2.2 cm: 2:00 position 1.3 x 0.8 x 0.5 cm, right axilla multiple enlarged lymph nodes 1 cm in size, right retropectoral lymph node 0.6 cm   02/11/2014 -  Neo-Adjuvant Chemotherapy Doxorubicin and Cyclophosphamide X 4 followed by Taxol weekly x12     CHIEF COMPLIANT: Last cycle of Taxol 12/12  INTERVAL HISTORY: Debbie Martinez is a 39 year old lady with above-mentioned history of right-sided breast cancer who is on neoadjuvant chemotherapy with dose dense Adriamycin and Cytoxan followed by weekly Taxol. Today's last dose of weekly Taxol. She does not have any new problems or concerns. She denies any neuropathy. She denies any nausea vomiting diarrhea constipation. She denies any fevers or chills.  REVIEW OF SYSTEMS:   Constitutional: Denies fevers, chills or abnormal weight loss Eyes: Denies blurriness of vision Ears, nose,  mouth, throat, and face: Denies mucositis or sore throat Respiratory: Denies cough, dyspnea or wheezes Cardiovascular: Denies palpitation, chest discomfort or lower extremity swelling Gastrointestinal:  Denies nausea, heartburn or change in bowel habits Skin: Denies abnormal skin rashes Lymphatics: Denies new lymphadenopathy or easy bruising Neurological:Denies numbness, tingling or new weaknesses Behavioral/Psych: Mood is stable, no new changes  Breast: Patient feels of the lumps in the breasts are smaller All other systems were reviewed with the patient and are negative.  I have reviewed the past medical history, past surgical history, social history and family history with the patient and they are unchanged from previous note.  ALLERGIES:  is allergic to lisinopril.  MEDICATIONS:  Current Outpatient Prescriptions  Medication Sig Dispense Refill  . atenolol (TENORMIN) 50 MG tablet Take 50 mg by mouth daily.    Marland Kitchen ENSURE (ENSURE) Take 237 mLs by mouth.    . ferrous sulfate 325 (65 FE) MG tablet Take 325 mg by mouth daily with breakfast.    . lidocaine-prilocaine (EMLA) cream APPLY TO AFFECTED AREA AS NEEDED 30 g 0  . LORazepam (ATIVAN) 0.5 MG tablet Take 1 tablet (0.5 mg total) by mouth every 6 (six) hours as needed for anxiety. 30 tablet 0  . oxyCODONE-acetaminophen (ROXICET) 5-325 MG per tablet Take 1-2 tablets by mouth every 4 (four) hours as needed. 50 tablet 0  . UNABLE TO FIND Apply 1 each topically as needed. 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    PHYSICAL  EXAMINATION: ECOG PERFORMANCE STATUS: 1 - Symptomatic but completely ambulatory  Filed Vitals:   07/01/14 0829  BP: 151/102  Pulse: 99  Temp: 98.4 F (36.9 C)  Resp: 18   Filed Weights   07/01/14 0829  Weight: 178 lb 14.4 oz (81.149 kg)    GENERAL:alert, no distress and comfortable SKIN: skin color, texture,  turgor are normal, no rashes or significant lesions EYES: normal, Conjunctiva are pink and non-injected, sclera clear OROPHARYNX:no exudate, no erythema and lips, buccal mucosa, and tongue normal  NECK: supple, thyroid normal size, non-tender, without nodularity LYMPH:  no palpable lymphadenopathy in the cervical, axillary or inguinal LUNGS: clear to auscultation and percussion with normal breathing effort HEART: regular rate & rhythm and no murmurs and no lower extremity edema ABDOMEN:abdomen soft, non-tender and normal bowel sounds Musculoskeletal:no cyanosis of digits and no clubbing  NEURO: alert & oriented x 3 with fluent speech, no focal motor/sensory deficits  LABORATORY DATA:  I have reviewed the data as listed   Chemistry      Component Value Date/Time   NA 142 06/24/2014 0916   K 3.7 06/24/2014 0916   CO2 25 06/24/2014 0916   BUN 11.7 06/24/2014 0916   CREATININE 0.8 06/24/2014 0916      Component Value Date/Time   CALCIUM 9.6 06/24/2014 0916   ALKPHOS 66 06/24/2014 0916   AST 19 06/24/2014 0916   ALT 30 06/24/2014 0916   BILITOT 0.35 06/24/2014 0916       Lab Results  Component Value Date   WBC 5.0 07/01/2014   HGB 11.5* 07/01/2014   HCT 36.3 07/01/2014   MCV 89.0 07/01/2014   PLT 258 07/01/2014   NEUTROABS 3.3 07/01/2014   ASSESSMENT & PLAN:  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   Current Treatment: Patient completed neoadjuvant dose dense Adriamycin and Cytoxan. Weekly Taxol treatment started 04/08/2014, today is week 12/12 of Taxol (07/01/14)  Chemotherapy related toxicities: 1. Alopecia 2. Chemotherapy related anemia: Being monitored 3. Loss of taste 4. Nail bed discoloration 5. 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  Chemotherapy induced anemia grade 1: Being monitored 7. Nail discoloration especially the thumb and great toes   Patient denies any nausea vomiting. I reviewed her blood work and her counts are adequate to treat.  Plan:  1. Breast MRI post neoadj chemo 2. Tumor board presentation 3. Followed by surgery   return to clinic March 25 to discuss MRI results and tumor board discussion  No orders of the defined types were placed in this encounter.   The patient has a good understanding of the overall plan. she agrees with it. She will call with any problems that may develop before her next visit here.   Rulon Eisenmenger, MD

## 2014-07-01 NOTE — Telephone Encounter (Signed)
Pt seen today.  Chart reviewed

## 2014-07-01 NOTE — Progress Notes (Signed)
Met with pt during final chemotherapy treatment in the infusion room. Pt doing well. Excited she made it through chemo treatments. Denies questions or needs at this time. Encourage pt to call with concerns. Received verbal understanding.

## 2014-07-01 NOTE — Telephone Encounter (Signed)
per pof to sch pt appt-gave tp copy of sch °

## 2014-07-01 NOTE — Assessment & Plan Note (Signed)
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   Current Treatment: Patient completed neoadjuvant dose dense Adriamycin and Cytoxan. Weekly Taxol treatment started 04/08/2014, today is week 12/12 of Taxol (07/01/14)  Chemotherapy related toxicities: 1. Alopecia 2. Chemotherapy related anemia: Being monitored 3. Loss of taste 4. Nail bed discoloration 5. 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 Chemotherapy induced anemia grade 1: Being monitored 7. Nail discoloration especially the thumb and great toes   Patient denies any nausea vomiting. I reviewed her blood work and her counts are adequate to treat.  Plan:  1. Breast MRI post neoadj chemo 2. Tumor board presentation 3. Followed by surgery

## 2014-07-09 ENCOUNTER — Telehealth: Payer: Self-pay

## 2014-07-09 ENCOUNTER — Ambulatory Visit
Admission: RE | Admit: 2014-07-09 | Discharge: 2014-07-09 | Disposition: A | Discharge status: Home / Self Care | Payer: Medicaid Other | Source: Ambulatory Visit | Attending: Hematology and Oncology | Admitting: Hematology and Oncology

## 2014-07-09 DIAGNOSIS — C50211 Malignant neoplasm of upper-inner quadrant of right female breast: Secondary | ICD-10-CM

## 2014-07-09 MED ORDER — GADOBENATE DIMEGLUMINE 529 MG/ML IV SOLN
17.0000 mL | Freq: Once | INTRAVENOUS | Status: AC | PRN
  Administered 2014-07-09: 17 mL via INTRAVENOUS

## 2014-07-09 NOTE — Telephone Encounter (Signed)
Call report rcvd from [facility] - pt advised to go to ED - no record on chart.    LMOVM - pt to return call to clinic and have triage transfer to desk nurse.

## 2014-07-16 ENCOUNTER — Ambulatory Visit (HOSPITAL_BASED_OUTPATIENT_CLINIC_OR_DEPARTMENT_OTHER): Payer: Medicaid Other | Admitting: Hematology and Oncology

## 2014-07-16 VITALS — BP 142/96 | HR 77 | Temp 97.8°F | Resp 18 | Ht 67.0 in | Wt 181.8 lb

## 2014-07-16 DIAGNOSIS — Z170 Estrogen receptor positive status [ER+]: Secondary | ICD-10-CM | POA: Diagnosis not present

## 2014-07-16 DIAGNOSIS — D649 Anemia, unspecified: Secondary | ICD-10-CM

## 2014-07-16 DIAGNOSIS — C50211 Malignant neoplasm of upper-inner quadrant of right female breast: Secondary | ICD-10-CM

## 2014-07-16 DIAGNOSIS — C773 Secondary and unspecified malignant neoplasm of axilla and upper limb lymph nodes: Secondary | ICD-10-CM

## 2014-07-16 DIAGNOSIS — G622 Polyneuropathy due to other toxic agents: Secondary | ICD-10-CM

## 2014-07-16 NOTE — Progress Notes (Signed)
Patient Care Team: Eloise Levels, NP as PCP - General (Nurse Practitioner) Autumn Messing III, MD as Consulting Physician (General Surgery) [name], MD as Consulting Physician (Hematology and Oncology) [name], MD as Attending Physician (Radiation Oncology)  DIAGNOSIS: Breast cancer of upper-inner quadrant of right female breast   Staging form: Breast, AJCC 7th Edition     Clinical: Stage IIB (T2, N1, cM0) - Unsigned       Staging comments: Staged at breast conference 01/20/14.      Pathologic: No stage assigned - Unsigned   SUMMARY OF ONCOLOGIC HISTORY:   Breast cancer of upper-inner quadrant of right female breast   01/12/2014 Mammogram Right breast 2:00 position 1.6 x 1.9 cm and the 12:00 position 0.5 x 0.9 cm distance between the 2 was 4.5 cm, right axillary lymph node enlargement   01/12/2014 Initial Diagnosis All 3 biopsies were IDC with DCIS ER percent PR 7200% Ki-67 85% HER-2 negative: Right axillary lymph node also positive for metastatic cancer ER positive HER-2 negative Ki-67 80% grade 2   01/18/2014 Breast MRI Right breast 12:00 position 2.8 x 2 x 2.2 cm: 2:00 position 1.3 x 0.8 x 0.5 cm, right axilla multiple enlarged lymph nodes 1 cm in size, right retropectoral lymph node 0.6 cm   02/11/2014 -  Neo-Adjuvant Chemotherapy Doxorubicin and Cyclophosphamide X 4 followed by Taxol weekly x12    07/12/2014 Breast MRI Partial response to neoadjuvant chemotherapy right breast mass decreased from 2.8 cm to 2.3 cm, 2 small masses along the medial margin also decreased; significant decrease right breast mass 11 to 12:00 and 13 mm to 8 mm, decrease in right axilla LN    CHIEF COMPLIANT: follow-up after recent breast MRI  INTERVAL HISTORY: Debbie Martinez is a 39 year old lady with above-mentioned history of right breast cancer treated with neoadjuvant chemotherapy and is here to discuss a breast MRI report. She is feeling well without any major side effects from chemotherapy. She has  good energy levels denies any neuropathy symptoms. She is anxious to see the MRI result.  REVIEW OF SYSTEMS:   Constitutional: Denies fevers, chills or abnormal weight loss Eyes: Denies blurriness of vision Ears, nose, mouth, throat, and face: Denies mucositis or sore throat Respiratory: Denies cough, dyspnea or wheezes Cardiovascular: Denies palpitation, chest discomfort or lower extremity swelling Gastrointestinal:  Denies nausea, heartburn or change in bowel habits Skin: Denies abnormal skin rashes Lymphatics: Denies new lymphadenopathy or easy bruising Neurological:Denies numbness, tingling or new weaknesses Behavioral/Psych: Mood is stable, no new changes  Breast:  denies any pain or lumps or nodules in either breasts All other systems were reviewed with the patient and are negative.  I have reviewed the past medical history, past surgical history, social history and family history with the patient and they are unchanged from previous note.  ALLERGIES:  is allergic to lisinopril.  MEDICATIONS:  Current Outpatient Prescriptions  Medication Sig Dispense Refill  . atenolol (TENORMIN) 50 MG tablet Take 50 mg by mouth daily.    Marland Kitchen ENSURE (ENSURE) Take 237 mLs by mouth.    . ferrous sulfate 325 (65 FE) MG tablet Take 325 mg by mouth daily with breakfast.    . lidocaine-prilocaine (EMLA) cream APPLY TO AFFECTED AREA AS NEEDED 30 g 0  . LORazepam (ATIVAN) 0.5 MG tablet TAKE 1 TABLET EVERY 6 HOURS AS NEEDED ANXIETY 30 tablet 0  . oxyCODONE-acetaminophen (ROXICET) 5-325 MG per tablet Take 1-2 tablets by mouth every 4 (four) hours as needed. 50 tablet  0  . UNABLE TO FIND Apply 1 each topically as needed. 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    PHYSICAL EXAMINATION: ECOG PERFORMANCE STATUS: 1 - Symptomatic but completely ambulatory  Filed Vitals:   07/16/14 0923  BP: 142/96  Pulse: 77   Temp: 97.8 F (36.6 C)  Resp: 18   Filed Weights   07/16/14 0923  Weight: 181 lb 12.8 oz (82.464 kg)    GENERAL:alert, no distress and comfortable SKIN: skin color, texture, turgor are normal, no rashes or significant lesions EYES: normal, Conjunctiva are pink and non-injected, sclera clear OROPHARYNX:no exudate, no erythema and lips, buccal mucosa, and tongue normal  NECK: supple, thyroid normal size, non-tender, without nodularity LYMPH:  no palpable lymphadenopathy in the cervical, axillary or inguinal LUNGS: clear to auscultation and percussion with normal breathing effort HEART: regular rate & rhythm and no murmurs and no lower extremity edema ABDOMEN:abdomen soft, non-tender and normal bowel sounds Musculoskeletal:no cyanosis of digits and no clubbing  NEURO: alert & oriented x 3 with fluent speech, no focal motor/sensory deficits  LABORATORY DATA:  I have reviewed the data as listed   Chemistry      Component Value Date/Time   NA 142 07/01/2014 0821   K 3.9 07/01/2014 0821   CO2 24 07/01/2014 0821   BUN 12.3 07/01/2014 0821   CREATININE 0.8 07/01/2014 0821      Component Value Date/Time   CALCIUM 9.2 07/01/2014 0821   ALKPHOS 63 07/01/2014 0821   AST 19 07/01/2014 0821   ALT 35 07/01/2014 0821   BILITOT 0.27 07/01/2014 0821       Lab Results  Component Value Date   WBC 5.0 07/01/2014   HGB 11.5* 07/01/2014   HCT 36.3 07/01/2014   MCV 89.0 07/01/2014   PLT 258 07/01/2014   NEUTROABS 3.3 07/01/2014     RADIOGRAPHIC STUDIES: I have personally reviewed the radiology reports and agreed with their findings. 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   Treatment summary: Patient completed neoadjuvant dose  dense Adriamycin and Cytoxan and weekly Taxol X 12 started started 02/09/2014 and completed 07/01/14  Summary of toxicities: Alopecia, anemia, loss of taste, nail discoloration, grade 1 peripheral neuropathy. Radiology review: I reviewed the MRI report with the patient great detail. It showed partial response to treatment with decrease in size of the primary breast tumor and decrease in size of lymph nodes. But patient continues to have residual disease.  Recommendation: Based on multidisciplinary clinic 1. Breast conserving surgery 2. ALLIANCE clinical trial: ALLIANCE [id number]: Phase 3 clinical trial in patients with node positive disease after neoadjuvant chemotherapy, if patient is positive for sentinel lymph node determined by intraoperative pathology on final pathology and if axillary lymph node dissection was not performed, patients could be randomized to axillary lymph node dissection with nodal radiation versus axillary radiation and nodal radiation 3. Followed by radiation 4. Followed by adjuvant antiestrogen therapy  No orders of the defined types were placed in this encounter.   The patient has a good understanding of the overall plan. she agrees with it. She will call with any problems that may develop before her next visit here.   Rulon Eisenmenger, MD

## 2014-07-16 NOTE — Assessment & Plan Note (Signed)
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   Treatment summary: Patient completed neoadjuvant dose dense Adriamycin and Cytoxan and weekly Taxol X 12 started started 02/09/2014 and completed 07/01/14  Summary of toxicities: Alopecia, anemia, loss of taste, nail discoloration, grade 1 peripheral neuropathy. Radiology review: I reviewed the MRI report with the patient great detail. It showed partial response to treatment with decrease in size of the primary breast tumor and decrease in size of lymph nodes. But patient continues to have residual disease.  Recommendation: Based on multidisciplinary clinic 1. Breast conserving surgery 2. ALLIANCE clinical trial: ALLIANCE [id number]: Phase 3 clinical trial in patients with node positive disease after neoadjuvant chemotherapy, if patient is positive for sentinel lymph node determined by intraoperative pathology on final pathology and if axillary lymph node dissection was not performed, patients could be randomized to axillary lymph node dissection with nodal radiation versus axillary radiation and nodal radiation 3. Followed by radiation 4. Followed by adjuvant antiestrogen therapy

## 2014-07-26 ENCOUNTER — Telehealth: Payer: Self-pay | Admitting: *Deleted

## 2014-07-26 ENCOUNTER — Encounter: Payer: Self-pay | Admitting: *Deleted

## 2014-07-26 NOTE — Telephone Encounter (Signed)
07/26/2014  Left message for patient to contact me as soon as possible at [phone] to schedule a meeting to discuss the Alliance [id number] surgical research study. Cindy S. Brigitte Pulse BSN, RN, CCRP 07/26/2014 1:01 PM

## 2014-07-28 ENCOUNTER — Encounter: Payer: Medicaid Other | Admitting: *Deleted

## 2014-07-28 NOTE — Progress Notes (Signed)
07/28/2014 See Consent Form encounter for documentation related to today's Clinical Research visit. Cindy S. Brigitte Pulse BSN, RN, [place] 07/28/2014 12:43 PM

## 2014-07-30 ENCOUNTER — Encounter: Payer: Self-pay | Admitting: *Deleted

## 2014-08-03 ENCOUNTER — Encounter: Payer: Self-pay | Admitting: Hematology and Oncology

## 2014-08-03 NOTE — Progress Notes (Signed)
I placed auth for massages on the desk of nurse for dr. Lindi Adie.

## 2014-08-04 ENCOUNTER — Encounter: Payer: Self-pay | Admitting: Hematology and Oncology

## 2014-08-04 NOTE — Progress Notes (Signed)
The patient will come and pick up form from front desk([name]).

## 2014-08-11 ENCOUNTER — Other Ambulatory Visit: Payer: Self-pay | Admitting: General Surgery

## 2014-08-11 DIAGNOSIS — C50911 Malignant neoplasm of unspecified site of right female breast: Secondary | ICD-10-CM

## 2014-08-12 ENCOUNTER — Other Ambulatory Visit: Payer: Self-pay

## 2014-08-13 ENCOUNTER — Telehealth: Payer: Self-pay | Admitting: Hematology and Oncology

## 2014-08-13 NOTE — Telephone Encounter (Signed)
Called patient with surg f/u appointment

## 2014-08-19 NOTE — Pre-Procedure Instructions (Addendum)
Debbie Martinez  08/19/2014   Your procedure is scheduled on:  May 2nd, Monday   Report to [facility] Admitting at 7:30 AM.  Call this number if you have problems the morning of surgery: [phone]   Remember:   Do not eat food or drink liquids after midnight Sunday.    Take these medicines the morning of surgery with A SIP OF WATER: Atenolol, Ativan-if needed, Oxycodone-if needed   Do not wear jewelry, make-up or nail polish.  Do not wear lotions, powders, or perfumes. You may NOt wear deodorant the day of surgery.  Do not shave underarms & legs 48 hours prior to surgery.    Do not bring valuables to the hospital.  [facility] is not responsible for any belongings or valuables.               Contacts, dentures or bridgework may not be worn into surgery.  Leave suitcase in the car. After surgery it may be brought to your room.  For patients admitted to the hospital, discharge time is determined by your treatment team.               Patients discharged the day of surgery will not be allowed to drive home.   Name and phone number of your driver:    Special Instructions: "Preparing for Surgery" instruction sheet.   Please read over the following fact sheets that you were given: Pain Booklet, Coughing and Deep Breathing and Surgical Site Infection Prevention

## 2014-08-20 ENCOUNTER — Other Ambulatory Visit: Payer: Self-pay | Admitting: Hematology and Oncology

## 2014-08-20 ENCOUNTER — Encounter (HOSPITAL_COMMUNITY): Payer: Self-pay

## 2014-08-20 ENCOUNTER — Encounter (HOSPITAL_COMMUNITY)
Admission: RE | Admit: 2014-08-20 | Discharge: 2014-08-20 | Disposition: A | Discharge status: Home / Self Care | Payer: Medicaid Other | Source: Ambulatory Visit | Attending: General Surgery | Admitting: General Surgery

## 2014-08-20 DIAGNOSIS — C50911 Malignant neoplasm of unspecified site of right female breast: Secondary | ICD-10-CM | POA: Insufficient documentation

## 2014-08-20 DIAGNOSIS — Z01812 Encounter for preprocedural laboratory examination: Secondary | ICD-10-CM | POA: Insufficient documentation

## 2014-08-20 LAB — BASIC METABOLIC PANEL
ANION GAP: 6 (ref 5–15)
BUN: 11 mg/dL (ref 6–23)
CO2: 27 mmol/L (ref 19–32)
Calcium: 9.5 mg/dL (ref 8.4–10.5)
Chloride: 107 mmol/L (ref 96–112)
Creatinine, Ser: 0.66 mg/dL (ref 0.50–1.10)
GFR calc Af Amer: >90 mL/min (ref >90)
GFR calc non Af Amer: >90 mL/min (ref >90)
Glucose, Bld: 120 mg/dL — ABNORMAL HIGH (ref 70–99)
Potassium: 4.2 mmol/L (ref 3.5–5.1)
Sodium: 140 mmol/L (ref 135–145)

## 2014-08-20 LAB — CBC
HCT: 37.9 % (ref 36.0–46.0)
HEMOGLOBIN: 12 g/dL (ref 12.0–15.0)
MCH: 27.6 pg (ref 26.0–34.0)
MCHC: 31.7 g/dL (ref 30.0–36.0)
MCV: 87.1 fL (ref 78.0–100.0)
Platelets: 217 10*3/uL (ref 150–400)
RBC: 4.35 MIL/uL (ref 3.87–5.11)
RDW: 13.5 % (ref 11.5–15.5)
WBC: 4.7 10*3/uL (ref 4.0–10.5)

## 2014-08-20 LAB — HCG, SERUM, QUALITATIVE: Preg, Serum: NEGATIVE

## 2014-08-20 MED ORDER — CHLORHEXIDINE GLUCONATE 4 % EX LIQD: 1.0000 "application " | Freq: Once | CUTANEOUS | Status: DC

## 2014-08-23 ENCOUNTER — Ambulatory Visit (HOSPITAL_COMMUNITY): Payer: Medicaid Other | Admitting: Certified Registered"

## 2014-08-23 ENCOUNTER — Encounter (HOSPITAL_COMMUNITY)
Admission: RE | Admit: 2014-08-23 | Discharge: 2014-08-23 | Disposition: A | Discharge status: Home / Self Care | Payer: Medicaid Other | Source: Ambulatory Visit | Attending: General Surgery | Admitting: General Surgery

## 2014-08-23 ENCOUNTER — Ambulatory Visit (HOSPITAL_COMMUNITY)
Admission: RE | Admit: 2014-08-23 | Discharge: 2014-08-23 | Disposition: A | Discharge status: Home / Self Care | Payer: Medicaid Other | Source: Ambulatory Visit | Attending: General Surgery | Admitting: General Surgery

## 2014-08-23 ENCOUNTER — Encounter (HOSPITAL_COMMUNITY)
Admission: RE | Disposition: A | Discharge status: Home / Self Care | Payer: Self-pay | Source: Ambulatory Visit | Attending: General Surgery

## 2014-08-23 ENCOUNTER — Ambulatory Visit
Admission: RE | Admit: 2014-08-23 | Discharge: 2014-08-23 | Disposition: A | Discharge status: Home / Self Care | Payer: Medicaid Other | Source: Ambulatory Visit | Attending: General Surgery | Admitting: General Surgery

## 2014-08-23 ENCOUNTER — Other Ambulatory Visit: Payer: Self-pay | Admitting: *Deleted

## 2014-08-23 ENCOUNTER — Encounter: Payer: Self-pay | Admitting: *Deleted

## 2014-08-23 ENCOUNTER — Encounter (HOSPITAL_COMMUNITY): Payer: Self-pay | Admitting: Anesthesiology

## 2014-08-23 ENCOUNTER — Other Ambulatory Visit: Payer: Self-pay | Admitting: General Surgery

## 2014-08-23 DIAGNOSIS — C50911 Malignant neoplasm of unspecified site of right female breast: Secondary | ICD-10-CM | POA: Diagnosis not present

## 2014-08-23 DIAGNOSIS — C50211 Malignant neoplasm of upper-inner quadrant of right female breast: Secondary | ICD-10-CM

## 2014-08-23 DIAGNOSIS — C773 Secondary and unspecified malignant neoplasm of axilla and upper limb lymph nodes: Secondary | ICD-10-CM | POA: Insufficient documentation

## 2014-08-23 HISTORY — PX: BREAST LUMPECTOMY WITH NEEDLE LOCALIZATION AND AXILLARY SENTINEL LYMPH NODE BX: SHX5760

## 2014-08-23 SURGERY — BREAST LUMPECTOMY WITH NEEDLE LOCALIZATION AND AXILLARY SENTINEL LYMPH NODE BX
Anesthesia: General | Site: Breast | Laterality: Right

## 2014-08-23 MED ORDER — FENTANYL CITRATE (PF) 250 MCG/5ML IJ SOLN
INTRAMUSCULAR | Status: AC
  Filled 2014-08-23: qty 5

## 2014-08-23 MED ORDER — SODIUM CHLORIDE 0.9 % IJ SOLN
INTRAMUSCULAR | Status: DC | PRN
  Administered 2014-08-23: 10 mL via INTRAVENOUS

## 2014-08-23 MED ORDER — OXYCODONE-ACETAMINOPHEN 5-325 MG PO TABS
ORAL_TABLET | ORAL | Status: AC
  Administered 2014-08-23: 2 via ORAL
  Filled 2014-08-23: qty 2

## 2014-08-23 MED ORDER — MIDAZOLAM HCL 5 MG/5ML IJ SOLN
INTRAMUSCULAR | Status: DC | PRN
  Administered 2014-08-23: 2 mg via INTRAVENOUS

## 2014-08-23 MED ORDER — LACTATED RINGERS IV SOLN
INTRAVENOUS | Status: DC
  Administered 2014-08-23 (×2): via INTRAVENOUS

## 2014-08-23 MED ORDER — HYDROMORPHONE HCL 1 MG/ML IJ SOLN
0.5000 mg | INTRAMUSCULAR | Status: AC | PRN
  Administered 2014-08-23 (×4): 0.5 mg via INTRAVENOUS

## 2014-08-23 MED ORDER — MIDAZOLAM HCL 2 MG/2ML IJ SOLN
INTRAMUSCULAR | Status: AC
  Filled 2014-08-23: qty 2

## 2014-08-23 MED ORDER — FENTANYL CITRATE (PF) 100 MCG/2ML IJ SOLN
INTRAMUSCULAR | Status: AC
  Filled 2014-08-23: qty 2

## 2014-08-23 MED ORDER — HYDROMORPHONE HCL 1 MG/ML IJ SOLN
INTRAMUSCULAR | Status: AC
  Administered 2014-08-23: 0.5 mg via INTRAVENOUS
  Filled 2014-08-23: qty 1

## 2014-08-23 MED ORDER — CEFAZOLIN SODIUM-DEXTROSE 2-3 GM-% IV SOLR
2.0000 g | INTRAVENOUS | Status: AC
  Administered 2014-08-23: 2 g via INTRAVENOUS
  Filled 2014-08-23: qty 50

## 2014-08-23 MED ORDER — TECHNETIUM TC 99M SULFUR COLLOID FILTERED
1.0000 | Freq: Once | INTRAVENOUS | Status: AC | PRN
  Administered 2014-08-23: 1 via INTRADERMAL

## 2014-08-23 MED ORDER — ONDANSETRON HCL 4 MG/2ML IJ SOLN
INTRAMUSCULAR | Status: DC | PRN
  Administered 2014-08-23: 4 mg via INTRAVENOUS

## 2014-08-23 MED ORDER — FENTANYL CITRATE (PF) 100 MCG/2ML IJ SOLN
INTRAMUSCULAR | Status: DC | PRN
  Administered 2014-08-23: 100 ug via INTRAVENOUS
  Administered 2014-08-23 (×2): 50 ug via INTRAVENOUS

## 2014-08-23 MED ORDER — BUPIVACAINE-EPINEPHRINE 0.25% -1:200000 IJ SOLN
INTRAMUSCULAR | Status: DC | PRN
  Administered 2014-08-23: 30 mL

## 2014-08-23 MED ORDER — 0.9 % SODIUM CHLORIDE (POUR BTL) OPTIME
TOPICAL | Status: DC | PRN
  Administered 2014-08-23: 1000 mL

## 2014-08-23 MED ORDER — LORAZEPAM 0.5 MG PO TABS: ORAL_TABLET | ORAL | Status: DC

## 2014-08-23 MED ORDER — PROPOFOL 10 MG/ML IV BOLUS
INTRAVENOUS | Status: DC | PRN
  Administered 2014-08-23: 150 mg via INTRAVENOUS

## 2014-08-23 MED ORDER — PHENYLEPHRINE HCL 10 MG/ML IJ SOLN
INTRAMUSCULAR | Status: DC | PRN
  Administered 2014-08-23 (×3): 80 ug via INTRAVENOUS

## 2014-08-23 MED ORDER — LIDOCAINE HCL (CARDIAC) 20 MG/ML IV SOLN
INTRAVENOUS | Status: AC
  Filled 2014-08-23: qty 5

## 2014-08-23 MED ORDER — SODIUM CHLORIDE 0.9 % IJ SOLN
INTRAMUSCULAR | Status: DC | PRN
  Administered 2014-08-23: 11:00:00

## 2014-08-23 MED ORDER — OXYCODONE-ACETAMINOPHEN 5-325 MG PO TABS: 1.0000 | ORAL_TABLET | ORAL | Status: DC | PRN

## 2014-08-23 MED ORDER — LIDOCAINE HCL (CARDIAC) 10 MG/ML IV SOLN
INTRAVENOUS | Status: DC | PRN
  Administered 2014-08-23: 70 mg via INTRAVENOUS

## 2014-08-23 MED ORDER — OXYCODONE HCL 5 MG PO TABS
ORAL_TABLET | ORAL | Status: AC
  Filled 2014-08-23: qty 1

## 2014-08-23 MED ORDER — ACETAMINOPHEN 325 MG PO TABS
ORAL_TABLET | ORAL | Status: AC
  Filled 2014-08-23: qty 2

## 2014-08-23 MED ORDER — BUPIVACAINE-EPINEPHRINE (PF) 0.5% -1:200000 IJ SOLN
INTRAMUSCULAR | Status: DC | PRN
  Administered 2014-08-23: 30 mL

## 2014-08-23 MED ORDER — ONDANSETRON HCL 4 MG/2ML IJ SOLN
INTRAMUSCULAR | Status: AC
  Filled 2014-08-23: qty 2

## 2014-08-23 MED ORDER — OXYCODONE-ACETAMINOPHEN 5-325 MG PO TABS
1.0000 | ORAL_TABLET | Freq: Once | ORAL | Status: AC
  Administered 2014-08-23: 2 via ORAL

## 2014-08-23 MED ORDER — PROPOFOL 10 MG/ML IV BOLUS
INTRAVENOUS | Status: AC
  Filled 2014-08-23: qty 20

## 2014-08-23 MED ORDER — BUPIVACAINE-EPINEPHRINE (PF) 0.25% -1:200000 IJ SOLN
INTRAMUSCULAR | Status: AC
  Filled 2014-08-23: qty 30

## 2014-08-23 MED ORDER — METHYLENE BLUE 1 % INJ SOLN
INTRAMUSCULAR | Status: AC
Start: 2014-08-23 — End: 2014-08-23
  Filled 2014-08-23: qty 10

## 2014-08-23 SURGICAL SUPPLY — 46 items
APPLIER CLIP 9.375 MED OPEN (MISCELLANEOUS) ×3
BINDER BREAST LRG (GAUZE/BANDAGES/DRESSINGS) IMPLANT
BINDER BREAST XLRG (GAUZE/BANDAGES/DRESSINGS) IMPLANT
BLADE SURG 15 STRL LF DISP TIS (BLADE) ×1 IMPLANT
BLADE SURG 15 STRL SS (BLADE) ×2
CANISTER SUCTION 2500CC (MISCELLANEOUS) ×3 IMPLANT
CHLORAPREP W/TINT 26ML (MISCELLANEOUS) ×3 IMPLANT
CLIP APPLIE 9.375 MED OPEN (MISCELLANEOUS) ×1 IMPLANT
CONT SPEC 4OZ CLIKSEAL STRL BL (MISCELLANEOUS) ×3 IMPLANT
COVER PROBE W GEL 5X96 (DRAPES) ×3 IMPLANT
COVER SURGICAL LIGHT HANDLE (MISCELLANEOUS) ×3 IMPLANT
DEVICE DUBIN SPECIMEN MAMMOGRA (MISCELLANEOUS) ×3 IMPLANT
DRAPE CHEST BREAST 15X10 FENES (DRAPES) ×3 IMPLANT
DRAPE UTILITY XL STRL (DRAPES) ×6 IMPLANT
ELECT COATED BLADE 2.86 ST (ELECTRODE) ×3 IMPLANT
ELECT REM PT RETURN 9FT ADLT (ELECTROSURGICAL) ×3
ELECTRODE REM PT RTRN 9FT ADLT (ELECTROSURGICAL) ×1 IMPLANT
GLOVE BIO SURGEON STRL SZ7.5 (GLOVE) ×18 IMPLANT
GLOVE BIOGEL PI IND STRL 6.5 (GLOVE) ×1 IMPLANT
GLOVE BIOGEL PI INDICATOR 6.5 (GLOVE) ×2
GLOVE SURG SS PI 6.5 STRL IVOR (GLOVE) ×9 IMPLANT
GOWN STRL REUS W/ TWL LRG LVL3 (GOWN DISPOSABLE) ×4 IMPLANT
GOWN STRL REUS W/TWL LRG LVL3 (GOWN DISPOSABLE) ×8
KIT BASIN OR (CUSTOM PROCEDURE TRAY) ×3 IMPLANT
KIT MARKER MARGIN INK (KITS) ×6 IMPLANT
KIT ROOM TURNOVER OR (KITS) ×3 IMPLANT
LIQUID BAND (GAUZE/BANDAGES/DRESSINGS) ×3 IMPLANT
NEEDLE 18GX1X1/2 (RX/OR ONLY) (NEEDLE) ×3 IMPLANT
NEEDLE HYPO 25GX1X1/2 BEV (NEEDLE) ×6 IMPLANT
NS IRRIG 1000ML POUR BTL (IV SOLUTION) ×3 IMPLANT
PACK SURGICAL SETUP 50X90 (CUSTOM PROCEDURE TRAY) ×3 IMPLANT
PAD ARMBOARD 7.5X6 YLW CONV (MISCELLANEOUS) ×3 IMPLANT
PENCIL BUTTON HOLSTER BLD 10FT (ELECTRODE) ×3 IMPLANT
SPONGE LAP 18X18 X RAY DECT (DISPOSABLE) ×3 IMPLANT
SUT MNCRL AB 4-0 PS2 18 (SUTURE) ×6 IMPLANT
SUT SILK 2 0 SH (SUTURE) IMPLANT
SUT VIC AB 3-0 54X BRD REEL (SUTURE) ×1 IMPLANT
SUT VIC AB 3-0 BRD 54 (SUTURE) ×2
SUT VIC AB 3-0 SH 18 (SUTURE) ×6 IMPLANT
SYR BULB 3OZ (MISCELLANEOUS) ×3 IMPLANT
SYR CONTROL 10ML LL (SYRINGE) ×6 IMPLANT
TOWEL OR 17X24 6PK STRL BLUE (TOWEL DISPOSABLE) ×3 IMPLANT
TOWEL OR 17X26 10 PK STRL BLUE (TOWEL DISPOSABLE) ×3 IMPLANT
TUBE CONNECTING 12'X1/4 (SUCTIONS) ×1
TUBE CONNECTING 12X1/4 (SUCTIONS) ×2 IMPLANT
YANKAUER SUCT BULB TIP NO VENT (SUCTIONS) ×3 IMPLANT

## 2014-08-23 NOTE — H&P (Signed)
Debbie Martinez 07/26/2014 8:44 AM Location: [facility] Patient #: [id number] DOB: [birth year] Single / Language: Debbie Martinez / Race: Black or African American Female  History of Present Illness Debbie Martinez. [name] MD; 08/02/2014 3:28 PM) Patient words: discuss sx after neoadjuvant therapy.  The patient is a 39 year old female who presents for a follow-up for Breast cancer. The patient is a 39 year old black female who had 2 areas of cancer in the upper inner right breast with positive lymph nodes. She has completed neoadjuvant chemotherapy in the 2 areas of cancer have significantly shrunk. Her lymph nodes clinically have also become negative. At this point she would like to pursue breast conservation and possibly be on a trial to evaluate her lymph nodes. She returns today to talk about surgical planning.   Other Problems Debbie Martinez, CMA; 07/26/2014 8:44 AM) Back Pain High blood pressure Lump In Breast MALIGNANT NEOPLASM OF UPPER-OUTER QUADRANT OF RIGHT FEMALE BREAST (174.4  C50.411) BREAST CANCER, RIGHT (174.9  C50.911)  Past Surgical History Debbie Martinez, CMA; 07/26/2014 8:44 AM) Oral Surgery  Diagnostic Studies History Debbie Martinez, [place]; 07/26/2014 8:44 AM) Colonoscopy never Mammogram within last year Pap Smear 1-5 years ago  Allergies Debbie Martinez, CMA; 07/26/2014 8:44 AM) No Known Drug Allergies04/07/2014  Medication History Debbie Martinez, CMA; 07/26/2014 8:46 AM) LORazepam (0.5MG  Tablet, Oral) Active. Atenolol (50MG  Tablet, Oral) Active. Ferrous Sulfate Active.  Social History Debbie Martinez, [place]; 07/26/2014 8:44 AM) Alcohol use Occasional alcohol use. Caffeine use Tea. No drug use Tobacco use Never smoker.  Family History Debbie Martinez, [place]; 07/26/2014 8:45 AM) Hypertension Father.  Pregnancy / Birth History Debbie Martinez, [place]; 07/26/2014 8:45 AM) Age at menarche 47 years. Contraceptive History Oral  contraceptives. Gravida 1 Maternal age 3-30 Para 1 Regular periods  Review of Systems Debbie Martinez. [name] MD; 08/02/2014 3:28 PM) General Not Present- Appetite Loss, Chills, Fatigue, Fever, Night Sweats, Weight Gain and Weight Loss. Skin Present- Hives. Not Present- Change in Wart/Mole, Dryness, Jaundice, New Lesions, Non-Healing Wounds, Rash and Ulcer. HEENT Present- Wears glasses/contact lenses. Not Present- Earache, Hearing Loss, Hoarseness, Nose Bleed, Oral Ulcers, Ringing in the Ears, Seasonal Allergies, Sinus Pain, Sore Throat, Visual Disturbances and Yellow Eyes. Breast Present- Breast Mass. Not Present- Breast Pain, Nipple Discharge and Skin Changes. Cardiovascular Not Present- Chest Pain, Difficulty Breathing Lying Down, Leg Cramps, Palpitations, Rapid Heart Rate, Shortness of Breath and Swelling of Extremities. Gastrointestinal Not Present- Abdominal Pain, Bloating, Bloody Stool, Change in Bowel Habits, Chronic diarrhea, Constipation, Difficulty Swallowing, Excessive gas, Gets full quickly at meals, Hemorrhoids, Indigestion, Nausea, Rectal Pain and Vomiting. Female Genitourinary Not Present- Frequency, Nocturia, Painful Urination, Pelvic Pain and Urgency. Musculoskeletal Not Present- Back Pain, Joint Pain, Joint Stiffness, Muscle Pain, Muscle Weakness and Swelling of Extremities. Neurological Not Present- Decreased Memory, Fainting, Headaches, Numbness, Seizures, Tingling, Tremor, Trouble walking and Weakness. Psychiatric Present- Anxiety. Not Present- Bipolar, Change in Sleep Pattern, Depression, Fearful and Frequent crying. Endocrine Not Present- Cold Intolerance, Excessive Hunger, Hair Changes, Heat Intolerance, Hot flashes and New Diabetes. Hematology Not Present- Easy Bruising, Excessive bleeding, Gland problems, HIV and Persistent Infections.   Vitals [employer] R. Brooks CMA; 07/26/2014 8:44 AM) 07/26/2014 8:44 AM Weight: 184 lb Height: 67in Body Surface Area: 1.99 m Body  Mass Index: 28.82 kg/m BP: 128/84 (Sitting, Left Arm, Standard)    Physical Exam Debbie Martinez S. [name] MD; 08/02/2014 3:29 PM) General Mental Status-Alert. General Appearance-Consistent with stated age. Hydration-Well hydrated. Voice-Normal.  Head and Neck  Head-normocephalic, atraumatic with no lesions or palpable masses. Trachea-midline. Thyroid Gland Characteristics - normal size and consistency.  Eye Eyeball - Bilateral-Extraocular movements intact. Sclera/Conjunctiva - Bilateral-No scleral icterus.  Chest and Lung Exam Chest and lung exam reveals -quiet, even and easy respiratory effort with no use of accessory muscles and on auscultation, normal breath sounds, no adventitious sounds and normal vocal resonance. Inspection Chest Wall - Normal. Back - normal.  Breast Note: There is no definite palpable mass in either breast. There is no palpable axillary, supraclavicular, or cervical lymphadenopathy   Cardiovascular Cardiovascular examination reveals -normal heart sounds, regular rate and rhythm with no murmurs and normal pedal pulses bilaterally.  Abdomen Inspection Inspection of the abdomen reveals - No Hernias. Skin - Scar - no surgical scars. Palpation/Percussion Palpation and Percussion of the abdomen reveal - Soft, Non Tender, No Rebound tenderness, No Rigidity (guarding) and No hepatosplenomegaly. Auscultation Auscultation of the abdomen reveals - Bowel sounds normal.  Neurologic Neurologic evaluation reveals -alert and oriented x 3 with no impairment of recent or remote memory. Mental Status-Normal.  Musculoskeletal Normal Exam - Left-Upper Extremity Strength Normal and Lower Extremity Strength Normal. Normal Exam - Right-Upper Extremity Strength Normal and Lower Extremity Strength Normal.  Lymphatic Head & Neck  General Head & Neck Lymphatics: Bilateral - Description - Normal. Axillary  General Axillary Region: Bilateral -  Description - Normal. Tenderness - Non Tender. Femoral & Inguinal  Generalized Femoral & Inguinal Lymphatics: Bilateral - Description - Normal. Tenderness - Non Tender.    Assessment & Plan Debbie Martinez S. [name] MD; 07/26/2014 9:11 AM) MALIGNANT NEOPLASM OF UPPER-OUTER QUADRANT OF RIGHT FEMALE BREAST (174.4  C50.411) Impression: The patient has completed neoadjuvant chemotherapy and is ready to schedule surgery. She has 2 cancers in the upper inner right breast that have both responded to chemotherapy. Clinically the lymph nodes in her right axilla have become negative. They were positive prior to chemotherapy. She has decided on breast conservation and would like to be on the Alliance trial. We will plan for to right breast wire localized lumpectomies and sentinel node mapping on trial     Signed by Luella Cook, MD (08/02/2014 3:29 PM)

## 2014-08-23 NOTE — Anesthesia Procedure Notes (Addendum)
Anesthesia Regional Block:  Pectoralis block  Pre-Anesthetic Checklist: ,, timeout performed, Correct Patient, Correct Site, Correct Laterality, Correct Procedure, Correct Position, site marked, Risks and benefits discussed,  Surgical consent,  Pre-op evaluation,  At surgeon's request and post-op pain management  Laterality: Right and Upper  Prep: chloraprep       Needles:   Needle Type: Echogenic Needle     Needle Length: 9cm 9 cm Needle Gauge: 21 and 21 G  Needle insertion depth: 7 cm   Additional Needles:  Procedures: ultrasound guided (picture in chart) Pectoralis block Narrative:  Start time: 08/23/2014 9:45 AM End time: 08/23/2014 10:00 AM Injection made incrementally with aspirations every 5 mL.  Performed by: Personally  Anesthesiologist: MASSAGEE, TERRY  Additional Notes: Tolerated well   Procedure Name: LMA Insertion Date/Time: 08/23/2014 10:08 AM Performed by: Greggory Stallion, Azzam Mehra L Pre-anesthesia Checklist: Patient identified, Emergency Drugs available, Suction available, Patient being monitored and Timeout performed Patient Re-evaluated:Patient Re-evaluated prior to inductionOxygen Delivery Method: Circle system utilized Preoxygenation: Pre-oxygenation with 100% oxygen Intubation Type: IV induction Ventilation: Mask ventilation without difficulty LMA: LMA inserted LMA Size: 4.0 Number of attempts: 1 Placement Confirmation: positive ETCO2 and breath sounds checked- equal and bilateral Tube secured with: Tape Dental Injury: Teeth and Oropharynx as per pre-operative assessment

## 2014-08-23 NOTE — Progress Notes (Signed)
08/23/14 at 11:30am - Step 1 randomization/ [id number]-  The pt was in the OR today for her breast surgery.  The research nurse spoke to Dr. Avis Epley, pathologist, before the pt's surgery and reviewed the study surgical and pathology requirements.  Dr. [name] called the research nurse from the OR and stated the pt had at least 1 positive lymph node.  Dr. [name] said the pt was eligible for study randomization.  The research nurse then spoke to Dr. Avis Epley again and confirmed that the pt had a "minimum of 1 sentinel lymph node and a maximum of 6 total nodes (sentinel + non-sentinel) identified and excised by the surgeon".  Dr. Avis Epley stated that there was at "total of 4 nodes and 1 was grossly positive".  He also confirmed that the positive lymph node was "greater than 0.2 mm in greatest dimension".  Dr. Avis Epley said that in his opinion the pt met the study requirements for randomization.  The research nurse then completed the pt's STEP 1 registration/randomization, and the pt was randomized to ARM 2: Axillary radiation and nodal radiation therapy only.  The research nurse then immediately notified Dr. [name] in the OR of the pt's [place] 2 randomization.  Dr. [name] was delighted to hear that the pt received the axillary radiation and nodal radiation therapy only arm.   Brion Aliment RN, BSN, CCRP Clinical Research Nurse 08/23/2014 11:52 AM

## 2014-08-23 NOTE — Anesthesia Preprocedure Evaluation (Addendum)
Anesthesia Evaluation  Patient identified by MRN, date of birth, ID band Patient awake    Airway Mallampati: II  TM Distance: >3 FB Neck ROM: Full    Dental  (+) Teeth Intact   Pulmonary neg shortness of breath,  breath sounds clear to auscultation        Cardiovascular hypertension, Rhythm:Regular Rate:Normal     Neuro/Psych    GI/Hepatic GERD-  ,  Endo/Other  diabetes  Renal/GU      Musculoskeletal   Abdominal   Peds  Hematology   Anesthesia Other Findings   Reproductive/Obstetrics                            Anesthesia Physical Anesthesia Plan  ASA: II  Anesthesia Plan: General   Post-op Pain Management:    Induction: Intravenous  Airway Management Planned: LMA  Additional Equipment:   Intra-op Plan:   Post-operative Plan: Extubation in OR  Informed Consent: I have reviewed the patients History and Physical, chart, labs and discussed the procedure including the risks, benefits and alternatives for the proposed anesthesia with the patient or authorized representative who has indicated his/her understanding and acceptance.     Plan Discussed with: CRNA and Surgeon  Anesthesia Plan Comments:         Anesthesia Quick Evaluation

## 2014-08-23 NOTE — Anesthesia Postprocedure Evaluation (Signed)
  Anesthesia Post-op Note  Patient: Chief Technology Officer  Procedure(s) Performed: Procedure(s): RIGHT BREAST LUMPECTOMY WITH NEEDLE LOCALIZATION(X'S 2)AND RIGHT AXILLARY SENTINEL LYMPH NODE Biopies (Right)  Patient Location: PACU  Anesthesia Type:General  Level of Consciousness: awake  Airway and Oxygen Therapy: Patient Spontanous Breathing  Post-op Pain: mild  Post-op Assessment: Post-op Vital signs reviewed, Patient's Cardiovascular Status Stable, Respiratory Function Stable, Patent Airway, No signs of Nausea or vomiting and Pain level controlled  Post-op Vital Signs: Reviewed and stable  Last Vitals:  Filed Vitals:   08/23/14 1350  BP: 134/73  Pulse: 59  Temp:   Resp: 18    Complications: No apparent anesthesia complications

## 2014-08-23 NOTE — Interval H&P Note (Signed)
History and Physical Interval Note:  08/23/2014 8:17 AM  Debbie Martinez  has presented today for surgery, with the diagnosis of RIGHT BREAST CANCER  The various methods of treatment have been discussed with the patient and family. After consideration of risks, benefits and other options for treatment, the patient has consented to  Procedure(s): RIGHT BREAST LUMPECTOMY WITH NEEDLE LOCALIZATION(X'S 2)AND RIGHT AXILLARY SENTINEL LYMPH NODE BX (Right) as a surgical intervention .  The patient's history has been reviewed, patient examined, no change in status, stable for surgery.  I have reviewed the patient's chart and labs.  Questions were answered to the patient's satisfaction.     TOTH III,PAUL S

## 2014-08-23 NOTE — Op Note (Signed)
08/23/2014  11:53 AM  PATIENT:  Debbie Martinez  39 y.o. female  PRE-OPERATIVE DIAGNOSIS:  RIGHT BREAST CANCER  POST-OPERATIVE DIAGNOSIS:  RIGHT BREAST CANCER  PROCEDURE:  Procedure(s): RIGHT BREAST LUMPECTOMY WITH NEEDLE LOCALIZATION(X'S 2)AND RIGHT AXILLARY SENTINEL LYMPH NODE BX (Right)  SURGEON:  Surgeon(s) and Role:    * [name], MD - Primary  PHYSICIAN ASSISTANT:   ASSISTANTS: Sharyn Dross, RNFA   ANESTHESIA:   general  EBL:  Total I/O In: 1000 [I.V.:1000] Out: -   BLOOD ADMINISTERED:none  DRAINS: none   LOCAL MEDICATIONS USED:  MARCAINE     SPECIMEN:  Source of Specimen:  right breast cancer X 2 and sentinel nodes X 4 with addition sup and ant margin on 12 o clock cancer  DISPOSITION OF SPECIMEN:  PATHOLOGY  COUNTS:  YES  TOURNIQUET:  * No tourniquets in log *  DICTATION: .Dragon Dictation  After informed consent was obtained the patient was brought to the operating room and placed in the supine position on the operating room table. After adequate induction of general anesthesia the patient's right chest, breast, and axillary areas were prepped with ChloraPrep, allowed to dry, and draped in usual sterile manner. Earlier in the day the patient underwent injection of 1 mCi of technetium sulfur colloid in the subareolar position on the right. At this point, 2 mL of methylene blue and 3 mL of injectable saline were also injected in the subareolar position on the right and the breast was massaged for several minutes. The neoprobe was used to identify a hot spot in the right axilla. A small transverse incision was made overlying the hot spot with a 15 blade knife. This incision was carried through the skin and subcutaneous tissue sharply with electrocautery until the axilla was entered. The neoprobe was used to direct blunt hemostat dissection until a hot lymph node was identified. The lymph node was excised sharply with electrocautery and the lymphatics were  controlled with clips. Ex vivo counts on sentinel node #1 were approximately 600. 3 other palpably enlarged lymph nodes were identified and each of these were excised in a similar fashion. These were sent as sentinel nodes #1, 2, 3, and 4. No other hot, blue, or palpable lymph nodes were identified in the right axilla. Touch preps on sentinel node #2 were positive. The patient is on the [facility] trial and was randomized to the axillary radiation arm. The deep layer of the wound was closed with interrupted 3-0 Vicryl stitches. The skin was then closed with a running 4-0 Monocryl subcuticular stitch. Attention was then turned to the right breast. The larger of the 2 cancers was in the 2:00 position. An elliptical incision was made overlying this area with a 15 blade knife. The incision was carried through the skin and subcutaneous tissue sharply with electrocautery. The path of the wire could be palpated. A circular portion of breast tissue was excised sharply around the path of the wire. This dissection was carried from skin to the chest wall. Once the specimen was removed it was oriented with the appropriate paint colors. A specimen radiograph showed the clip and wire to be in the center of the specimen. The 12:00 cancer and wire was palpable through the incision. The dissection was carried superiorly through the current opening and a circular portion of breast tissue was excised sharply around the path of the wire. Once the specimen was removed it was oriented with the appropriate paint colors. A specimen radiograph showed the  clip and wire to be in the specimen but we appeared close on the superior margin. An additional anterior and superior margin was excised sharply with the electrocautery and marked with a stitch on the new true surgical margin. Margins were sent to pathology for further evaluation along with the specimen. The cavities were then marked with clips. Hemostasis was achieved using the Bovie  electrocautery. The wound was infiltrated with quarter percent Marcaine and irrigated with saline. The deep layer of the wound was closed with interrupted 3-0 Vicryl stitches. The skin was then closed with interrupted 4-0 Monocryl subcuticular stitches. Dermabond dressings were applied. Patient tolerated the procedure well. At the end of the case all needle sponge and instrument counts were correct. The patient was then awakened and taken to recovery in stable condition.  PLAN OF CARE: Discharge to home after PACU  PATIENT DISPOSITION:  PACU - hemodynamically stable.   Delay start of Pharmacological VTE agent (>24hrs) due to surgical blood loss or risk of bleeding: not applicable

## 2014-08-23 NOTE — Transfer of Care (Signed)
Immediate Anesthesia Transfer of Care Note  Patient: Debbie Martinez  Procedure(s) Performed: Procedure(s): RIGHT BREAST LUMPECTOMY WITH NEEDLE LOCALIZATION(X'S 2)AND RIGHT AXILLARY SENTINEL LYMPH NODE Biopies (Right)  Patient Location: PACU  Anesthesia Type:General  Level of Consciousness: awake, sedated, patient cooperative and responds to stimulation  Airway & Oxygen Therapy: Patient Spontanous Breathing  Post-op Assessment: Report given to RN, Post -op Vital signs reviewed and stable and Patient moving all extremities  Post vital signs: Reviewed and stable  Last Vitals:  Filed Vitals:   08/23/14 0914  BP: 128/68  Pulse: 69  Temp: 36.7 C  Resp: 20    Complications: No apparent anesthesia complications

## 2014-08-24 ENCOUNTER — Encounter (HOSPITAL_COMMUNITY): Payer: Self-pay | Admitting: General Surgery

## 2014-08-24 NOTE — Anesthesia Postprocedure Evaluation (Deleted)
  Anesthesia Post-op Note  Patient: Chief Technology Officer  Procedure(s) Performed: Procedure(s): RIGHT BREAST LUMPECTOMY WITH NEEDLE LOCALIZATION(X'S 2)AND RIGHT AXILLARY SENTINEL LYMPH NODE Biopies (Right)  Patient Location: PACU  Anesthesia Type:General and Regional  Level of Consciousness: awake  Airway and Oxygen Therapy: Patient Spontanous Breathing  Post-op Pain: mild  Post-op Assessment: Post-op Vital signs reviewed, Patient's Cardiovascular Status Stable, Respiratory Function Stable, Patent Airway, No signs of Nausea or vomiting and Pain level controlled  Post-op Vital Signs: Reviewed and stable  Last Vitals:  Filed Vitals:   08/23/14 1350  BP: 134/73  Pulse: 59  Temp:   Resp: 18    Complications: No apparent anesthesia complications

## 2014-09-01 NOTE — Assessment & Plan Note (Signed)
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   Treatment summary: Patient completed neoadjuvant dose dense Adriamycin and Cytoxan and weekly Taxol X 12 started started 02/09/2014 and completed 07/01/14  Summary of toxicities: Alopecia, anemia, loss of taste, nail discoloration, grade 1 peripheral neuropathy. Pathology review: Right lumpectomy 2:00: IDC grade 3, 3.2 cm, high-grade DCIS, LV I present, PNI present,; 12:00: IDC grade 3; 0.8 cm, high-grade DCIS, LVI, 4/4 lymph nodes positive with ECE, ER 90%, PR 40%, HER-2 negative margin positive T2 N2 M0 stage IIIa I discussed with her the pathology report certainly is extremely concerning given the fact that she has 4 positive lymph nodes still residual disease with extracapsular extension. Obviously the cancer did not respond very well to chemotherapy and so additional chemotherapy would be of no benefit.  Patient is on [facility] clinical trial and she was randomized intraoperatively for NO lymph node dissection.  Recommendation: Based on multidisciplinary clinic 1. Adjuvant radiation 2. Followed by adjuvant antiestrogen therapy I will consider her for clinical trial [place] when she is ready to get started on treatment.  Return to clinic after the conclusion of radiation therapy to discuss antiestrogen therapy.

## 2014-09-02 ENCOUNTER — Encounter: Payer: Self-pay | Admitting: *Deleted

## 2014-09-02 ENCOUNTER — Other Ambulatory Visit: Payer: Self-pay | Admitting: General Surgery

## 2014-09-02 ENCOUNTER — Other Ambulatory Visit: Payer: Self-pay | Admitting: *Deleted

## 2014-09-02 ENCOUNTER — Telehealth: Payer: Self-pay | Admitting: Hematology and Oncology

## 2014-09-02 ENCOUNTER — Ambulatory Visit (HOSPITAL_BASED_OUTPATIENT_CLINIC_OR_DEPARTMENT_OTHER): Payer: Medicaid Other | Admitting: Hematology and Oncology

## 2014-09-02 VITALS — BP 135/92 | HR 72 | Temp 98.1°F | Resp 18 | Ht 67.0 in | Wt 187.8 lb

## 2014-09-02 DIAGNOSIS — C50211 Malignant neoplasm of upper-inner quadrant of right female breast: Secondary | ICD-10-CM

## 2014-09-02 DIAGNOSIS — C773 Secondary and unspecified malignant neoplasm of axilla and upper limb lymph nodes: Secondary | ICD-10-CM

## 2014-09-02 NOTE — Progress Notes (Signed)
Met with pt post-op to assess needs. Relate she is doing well, although "sore". Taking her pain medication as prescribed.  Denies needs at this time. Encourage pt to call with questions or concerns. Received verbal understanding.

## 2014-09-02 NOTE — Telephone Encounter (Signed)
Appointments made and avs printed for patient °

## 2014-09-02 NOTE — Progress Notes (Signed)
Patient Care Team: Eloise Levels, NP as PCP - General (Nurse Practitioner) Autumn Messing III, MD as Consulting Physician (General Surgery) [name], MD as Consulting Physician (Hematology and Oncology) [name], MD as Attending Physician (Radiation Oncology)  DIAGNOSIS: Breast cancer of upper-inner quadrant of right female breast   Staging form: Breast, AJCC 7th Edition     Clinical: Stage IIB (T2, N1, cM0) - Unsigned       Staging comments: Staged at breast conference 01/20/14.      Pathologic stage from 08/25/2014: Stage IIIA (yT2(m), N2a, cM0) - Unsigned       Staging comments: Staged on final lumpectomy specimen by Dr. Lyndon Code    SUMMARY OF ONCOLOGIC HISTORY:   Breast cancer of upper-inner quadrant of right female breast   01/12/2014 Mammogram Right breast 2:00 position 1.6 x 1.9 cm and the 12:00 position 0.5 x 0.9 cm distance between the 2 was 4.5 cm, right axillary lymph node enlargement   01/12/2014 Initial Diagnosis All 3 biopsies were IDC with DCIS ER percent PR 7200% Ki-67 85% HER-2 negative: Right axillary lymph node also positive for metastatic cancer ER positive HER-2 negative Ki-67 80% grade 2   01/18/2014 Breast MRI Right breast 12:00 position 2.8 x 2 x 2.2 cm: 2:00 position 1.3 x 0.8 x 0.5 cm, right axilla multiple enlarged lymph nodes 1 cm in size, right retropectoral lymph node 0.6 cm   02/11/2014 -  Neo-Adjuvant Chemotherapy Doxorubicin and Cyclophosphamide X 4 followed by Taxol weekly x12    07/12/2014 Breast MRI Partial response to neoadjuvant chemotherapy right breast mass decreased from 2.8 cm to 2.3 cm, 2 small masses along the medial margin also decreased; significant decrease right breast mass 11 to 12:00 and 13 mm to 8 mm, decrease in right axilla LN   08/23/2014 Surgery Right lumpectomy 2:00: IDC grade 3, 3.2 cm, high-grade DCIS, LV I present, PNI present,; 12:00: IDC grade 30.8 cm, high-grade DCIS, LV I, 4/4 lymph nodes positive with ECE, ER 90%, PR 40%, HER-2 negative  margin positive    CHIEF COMPLIANT: follow-up to discuss final pathology  INTERVAL HISTORY: Debbie Martinez is a 39 year old with above-mentioned history of right-sided breast cancer who received neoadjuvant chemotherapy and underwent recent double lumpectomies and sentinel lymph node study as part of [facility] clinical trial. We had discussed the case extensively in the multidisciplinary tumor board. She is here today to discuss the pathology report and to plan further treatment. Patient extremely shocked to hear that there was significant amount of residual breast cancer and that for lymph nodes are positive for the disease in spite of doing neoadjuvant chemotherapy.  REVIEW OF SYSTEMS:   Constitutional: Denies fevers, chills or abnormal weight loss Eyes: Denies blurriness of vision Ears, nose, mouth, throat, and face: Denies mucositis or sore throat Respiratory: Denies cough, dyspnea or wheezes Cardiovascular: Denies palpitation, chest discomfort or lower extremity swelling Gastrointestinal:  Denies nausea, heartburn or change in bowel habits Skin: Denies abnormal skin rashes Lymphatics: Denies new lymphadenopathy or easy bruising Neurological:Denies numbness, tingling or new weaknesses Behavioral/Psych: Mood is stable, no new changes  Breast:  Sore from recent surgery All other systems were reviewed with the patient and are negative.  I have reviewed the past medical history, past surgical history, social history and family history with the patient and they are unchanged from previous note.  ALLERGIES:  is allergic to lisinopril.  MEDICATIONS:  Current Outpatient Prescriptions  Medication Sig Dispense Refill  . atenolol (TENORMIN) 50 MG tablet Take  50 mg by mouth daily.    Marland Kitchen ENSURE (ENSURE) Take 237 mLs by mouth 2 (two) times daily between meals.     . ferrous sulfate 325 (65 FE) MG tablet Take 325 mg by mouth daily with breakfast.    . LORazepam (ATIVAN) 0.5 MG tablet TAKE 1  TABLET EVERY 6 HOURS AS NEEDED ANXIETY 30 tablet 0  . oxyCODONE-acetaminophen (ROXICET) 5-325 MG per tablet Take 1-2 tablets by mouth every 4 (four) hours as needed. 50 tablet 0   No current facility-administered medications for this visit.    PHYSICAL EXAMINATION: ECOG PERFORMANCE STATUS: 1 - Symptomatic but completely ambulatory  Filed Vitals:   09/02/14 0927  BP: 135/92  Pulse: 72  Temp: 98.1 F (36.7 C)  Resp: 18   Filed Weights   09/02/14 0927  Weight: 187 lb 12.8 oz (85.186 kg)    GENERAL:alert, no distress and comfortable SKIN: skin color, texture, turgor are normal, no rashes or significant lesions EYES: normal, Conjunctiva are pink and non-injected, sclera clear OROPHARYNX:no exudate, no erythema and lips, buccal mucosa, and tongue normal  NECK: supple, thyroid normal size, non-tender, without nodularity LYMPH:  no palpable lymphadenopathy in the cervical, axillary or inguinal LUNGS: clear to auscultation and percussion with normal breathing effort HEART: regular rate & rhythm and no murmurs and no lower extremity edema ABDOMEN:abdomen soft, non-tender and normal bowel sounds Musculoskeletal:no cyanosis of digits and no clubbing  NEURO: alert & oriented x 3 with fluent speech, no focal motor/sensory deficits  LABORATORY DATA:  I have reviewed the data as listed   Chemistry      Component Value Date/Time   NA 140 08/20/2014 0843   NA 142 07/01/2014 0821   K 4.2 08/20/2014 0843   K 3.9 07/01/2014 0821   CL 107 08/20/2014 0843   CO2 27 08/20/2014 0843   CO2 24 07/01/2014 0821   BUN 11 08/20/2014 0843   BUN 12.3 07/01/2014 0821   CREATININE 0.66 08/20/2014 0843   CREATININE 0.8 07/01/2014 0821      Component Value Date/Time   CALCIUM 9.5 08/20/2014 0843   CALCIUM 9.2 07/01/2014 0821   ALKPHOS 63 07/01/2014 0821   AST 19 07/01/2014 0821   ALT 35 07/01/2014 0821   BILITOT 0.27 07/01/2014 0821       Lab Results  Component Value Date   WBC 4.7  08/20/2014   HGB 12.0 08/20/2014   HCT 37.9 08/20/2014   MCV 87.1 08/20/2014   PLT 217 08/20/2014   NEUTROABS 3.3 07/01/2014    ASSESSMENT & PLAN:  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   Treatment summary: Patient completed neoadjuvant dose dense Adriamycin and Cytoxan and weekly Taxol X 12 started started 02/09/2014 and completed 07/01/14  Summary of toxicities: Alopecia, anemia, loss of taste, nail discoloration, grade 1 peripheral neuropathy. Pathology review: Right double lumpectomy 2:00: IDC grade 3, 3.2 cm, high-grade DCIS, LV I present, PNI present,; 12:00: IDC grade 3; 0.8 cm, high-grade DCIS, LVI, 4/4 lymph nodes positive with ECE, ER 90%, PR 40%, HER-2 negative margin positive T2 N2 M0 stage IIIa I discussed with her the pathology report certainly is extremely concerning given the fact that she has 4 positive lymph nodes still residual disease with extracapsular extension. Obviously the cancer did not respond very well to chemotherapy and so additional chemotherapy would  be of no benefit.  Patient is on [facility] clinical trial and she was randomized intraoperatively for NO lymph node dissection.  Recommendation: Based on multidisciplinary clinic 1. Adjuvant radiation 2. Followed by adjuvant antiestrogen therapy I discussed with her that complete ovarian suppression with Zoladex plus anastrozole would be an ideal anti-estrogen therapy approach. The other option would be to consider oophorectomy.  We will discuss this further when she is ready to start treatment.when she returns back and went off months we will consider enrollment in [place] clinical trial as well.  Return to clinic after the conclusion of radiation therapy to discuss antiestrogen therapy.  No orders of the defined  types were placed in this encounter.   The patient has a good understanding of the overall plan. she agrees with it. she will call with any problems that may develop before the next visit here.   Rulon Eisenmenger, MD

## 2014-09-02 NOTE — Progress Notes (Signed)
09/02/2014 Patient in to clinic today for follow-up visit after surgery on 08/23/2014. Due to the presence of positive margins on surgical specimen, patient will return to surgery for re-excision. Patient is aware that she remains a participant in the research study, with plans to continue Arm 2 treatment (Axillary XRT and Nodal XRT), provided she meets study criteria following her additional surgery. Patient had no further questions or concerns regarding the study at this time. Copy of [facility] [id number] protocol Update #05 sent to Radiation Oncology staff Dr. Isidore Moos and Mariann Laster Ramer. Will continue to monitor and review patient status and timelines. Cindy S. Brigitte Pulse BSN, RN, [facility] 09/02/2014 3:36 PM

## 2014-09-08 ENCOUNTER — Encounter (HOSPITAL_BASED_OUTPATIENT_CLINIC_OR_DEPARTMENT_OTHER): Payer: Self-pay | Admitting: *Deleted

## 2014-09-15 ENCOUNTER — Ambulatory Visit (HOSPITAL_BASED_OUTPATIENT_CLINIC_OR_DEPARTMENT_OTHER): Payer: Medicaid Other | Admitting: Anesthesiology

## 2014-09-15 ENCOUNTER — Encounter (HOSPITAL_BASED_OUTPATIENT_CLINIC_OR_DEPARTMENT_OTHER)
Admission: RE | Disposition: A | Discharge status: Home / Self Care | Payer: Self-pay | Source: Ambulatory Visit | Attending: General Surgery

## 2014-09-15 ENCOUNTER — Encounter (HOSPITAL_BASED_OUTPATIENT_CLINIC_OR_DEPARTMENT_OTHER): Payer: Self-pay | Admitting: Anesthesiology

## 2014-09-15 ENCOUNTER — Ambulatory Visit (HOSPITAL_BASED_OUTPATIENT_CLINIC_OR_DEPARTMENT_OTHER)
Admission: RE | Admit: 2014-09-15 | Discharge: 2014-09-15 | Disposition: A | Discharge status: Home / Self Care | Payer: Medicaid Other | Source: Ambulatory Visit | Attending: General Surgery | Admitting: General Surgery

## 2014-09-15 DIAGNOSIS — N6011 Diffuse cystic mastopathy of right breast: Secondary | ICD-10-CM | POA: Diagnosis not present

## 2014-09-15 DIAGNOSIS — D6481 Anemia due to antineoplastic chemotherapy: Secondary | ICD-10-CM | POA: Insufficient documentation

## 2014-09-15 DIAGNOSIS — C773 Secondary and unspecified malignant neoplasm of axilla and upper limb lymph nodes: Secondary | ICD-10-CM | POA: Diagnosis not present

## 2014-09-15 DIAGNOSIS — F419 Anxiety disorder, unspecified: Secondary | ICD-10-CM | POA: Diagnosis not present

## 2014-09-15 DIAGNOSIS — C50211 Malignant neoplasm of upper-inner quadrant of right female breast: Secondary | ICD-10-CM | POA: Insufficient documentation

## 2014-09-15 DIAGNOSIS — I10 Essential (primary) hypertension: Secondary | ICD-10-CM | POA: Diagnosis not present

## 2014-09-15 HISTORY — PX: RE-EXCISION OF BREAST CANCER,SUPERIOR MARGINS: SHX6047

## 2014-09-15 SURGERY — RE-EXCISION OF BREAST CANCER,SUPERIOR MARGINS
Anesthesia: General | Site: Breast | Laterality: Right

## 2014-09-15 MED ORDER — OXYCODONE HCL 5 MG PO TABS
ORAL_TABLET | ORAL | Status: AC
  Filled 2014-09-15: qty 1

## 2014-09-15 MED ORDER — DEXAMETHASONE SODIUM PHOSPHATE 4 MG/ML IJ SOLN
INTRAMUSCULAR | Status: DC | PRN
  Administered 2014-09-15: 10 mg via INTRAVENOUS

## 2014-09-15 MED ORDER — KETOROLAC TROMETHAMINE 30 MG/ML IJ SOLN
INTRAMUSCULAR | Status: AC
  Filled 2014-09-15: qty 1

## 2014-09-15 MED ORDER — FENTANYL CITRATE (PF) 100 MCG/2ML IJ SOLN
INTRAMUSCULAR | Status: AC
  Filled 2014-09-15: qty 6

## 2014-09-15 MED ORDER — CHLORHEXIDINE GLUCONATE 4 % EX LIQD: 1.0000 "application " | Freq: Once | CUTANEOUS | Status: DC

## 2014-09-15 MED ORDER — PROMETHAZINE HCL 25 MG/ML IJ SOLN: 6.2500 mg | INTRAMUSCULAR | Status: DC | PRN

## 2014-09-15 MED ORDER — MIDAZOLAM HCL 5 MG/5ML IJ SOLN
INTRAMUSCULAR | Status: DC | PRN
  Administered 2014-09-15: 2 mg via INTRAVENOUS

## 2014-09-15 MED ORDER — LACTATED RINGERS IV SOLN
INTRAVENOUS | Status: DC
  Administered 2014-09-15 (×2): via INTRAVENOUS

## 2014-09-15 MED ORDER — CEFAZOLIN SODIUM-DEXTROSE 2-3 GM-% IV SOLR
2.0000 g | INTRAVENOUS | Status: AC
  Administered 2014-09-15: 2 g via INTRAVENOUS

## 2014-09-15 MED ORDER — OXYCODONE HCL 5 MG PO TABS
5.0000 mg | ORAL_TABLET | Freq: Once | ORAL | Status: AC
  Administered 2014-09-15: 5 mg via ORAL

## 2014-09-15 MED ORDER — CEFAZOLIN SODIUM-DEXTROSE 2-3 GM-% IV SOLR
INTRAVENOUS | Status: AC
  Filled 2014-09-15: qty 50

## 2014-09-15 MED ORDER — HYDROMORPHONE HCL 1 MG/ML IJ SOLN
INTRAMUSCULAR | Status: AC
  Filled 2014-09-15: qty 1

## 2014-09-15 MED ORDER — KETOROLAC TROMETHAMINE 30 MG/ML IJ SOLN
30.0000 mg | Freq: Once | INTRAMUSCULAR | Status: AC | PRN
  Administered 2014-09-15: 30 mg via INTRAVENOUS

## 2014-09-15 MED ORDER — MIDAZOLAM HCL 2 MG/2ML IJ SOLN
INTRAMUSCULAR | Status: AC
  Filled 2014-09-15: qty 2

## 2014-09-15 MED ORDER — LIDOCAINE HCL (CARDIAC) 20 MG/ML IV SOLN
INTRAVENOUS | Status: DC | PRN
  Administered 2014-09-15: 50 mg via INTRAVENOUS

## 2014-09-15 MED ORDER — BUPIVACAINE HCL (PF) 0.25 % IJ SOLN
INTRAMUSCULAR | Status: DC | PRN
  Administered 2014-09-15: 10 mL

## 2014-09-15 MED ORDER — FENTANYL CITRATE (PF) 100 MCG/2ML IJ SOLN
INTRAMUSCULAR | Status: DC | PRN
  Administered 2014-09-15: 100 ug via INTRAVENOUS

## 2014-09-15 MED ORDER — HYDROMORPHONE HCL 1 MG/ML IJ SOLN
0.2500 mg | INTRAMUSCULAR | Status: DC | PRN
  Administered 2014-09-15 (×2): 0.5 mg via INTRAVENOUS

## 2014-09-15 MED ORDER — PROPOFOL 10 MG/ML IV BOLUS
INTRAVENOUS | Status: DC | PRN
  Administered 2014-09-15: 200 mg via INTRAVENOUS

## 2014-09-15 MED ORDER — OXYCODONE-ACETAMINOPHEN 5-325 MG PO TABS: 1.0000 | ORAL_TABLET | ORAL | Status: DC | PRN

## 2014-09-15 SURGICAL SUPPLY — 40 items
BLADE SURG 15 STRL LF DISP TIS (BLADE) ×1 IMPLANT
BLADE SURG 15 STRL SS (BLADE) ×2
CANISTER SUCT 1200ML W/VALVE (MISCELLANEOUS) ×3 IMPLANT
CHLORAPREP W/TINT 26ML (MISCELLANEOUS) ×3 IMPLANT
CLIP TI WIDE RED SMALL 6 (CLIP) ×3 IMPLANT
COVER BACK TABLE 60X90IN (DRAPES) ×3 IMPLANT
COVER MAYO STAND STRL (DRAPES) ×3 IMPLANT
DECANTER SPIKE VIAL GLASS SM (MISCELLANEOUS) IMPLANT
DEVICE DUBIN W/COMP PLATE 8390 (MISCELLANEOUS) IMPLANT
DRAPE LAPAROSCOPIC ABDOMINAL (DRAPES) ×3 IMPLANT
DRAPE UTILITY XL STRL (DRAPES) ×3 IMPLANT
ELECT COATED BLADE 2.86 ST (ELECTRODE) ×3 IMPLANT
ELECT REM PT RETURN 9FT ADLT (ELECTROSURGICAL) ×3
ELECTRODE REM PT RTRN 9FT ADLT (ELECTROSURGICAL) ×1 IMPLANT
GLOVE BIO SURGEON STRL SZ 6.5 (GLOVE) ×2 IMPLANT
GLOVE BIO SURGEON STRL SZ7.5 (GLOVE) ×3 IMPLANT
GLOVE BIO SURGEONS STRL SZ 6.5 (GLOVE) ×1
GLOVE BIOGEL PI IND STRL 7.0 (GLOVE) ×1 IMPLANT
GLOVE BIOGEL PI INDICATOR 7.0 (GLOVE) ×2
GLOVE EXAM NITRILE MD LF STRL (GLOVE) ×3 IMPLANT
GOWN STRL REUS W/ TWL LRG LVL3 (GOWN DISPOSABLE) ×2 IMPLANT
GOWN STRL REUS W/TWL LRG LVL3 (GOWN DISPOSABLE) ×4
KIT MARKER MARGIN INK (KITS) IMPLANT
LIQUID BAND (GAUZE/BANDAGES/DRESSINGS) ×3 IMPLANT
NEEDLE HYPO 25X1 1.5 SAFETY (NEEDLE) ×3 IMPLANT
NS IRRIG 1000ML POUR BTL (IV SOLUTION) ×3 IMPLANT
PACK BASIN DAY SURGERY FS (CUSTOM PROCEDURE TRAY) ×3 IMPLANT
PENCIL BUTTON HOLSTER BLD 10FT (ELECTRODE) ×3 IMPLANT
SLEEVE SCD COMPRESS KNEE MED (MISCELLANEOUS) ×3 IMPLANT
SPONGE LAP 18X18 X RAY DECT (DISPOSABLE) ×3 IMPLANT
STAPLER VISISTAT 35W (STAPLE) IMPLANT
SUT MON AB 4-0 PC3 18 (SUTURE) ×3 IMPLANT
SUT SILK 2 0 SH (SUTURE) ×3 IMPLANT
SUT VICRYL 3-0 CR8 SH (SUTURE) ×3 IMPLANT
SYR CONTROL 10ML LL (SYRINGE) ×3 IMPLANT
TOWEL OR 17X24 6PK STRL BLUE (TOWEL DISPOSABLE) IMPLANT
TOWEL OR NON WOVEN STRL DISP B (DISPOSABLE) ×3 IMPLANT
TUBE CONNECTING 20'X1/4 (TUBING) ×1
TUBE CONNECTING 20X1/4 (TUBING) ×2 IMPLANT
YANKAUER SUCT BULB TIP NO VENT (SUCTIONS) ×3 IMPLANT

## 2014-09-15 NOTE — Anesthesia Procedure Notes (Signed)
Procedure Name: LMA Insertion Date/Time: 09/15/2014 12:17 PM Performed by: Lieutenant Diego Pre-anesthesia Checklist: Patient identified, Emergency Drugs available, Suction available and Patient being monitored Patient Re-evaluated:Patient Re-evaluated prior to inductionOxygen Delivery Method: Circle System Utilized Preoxygenation: Pre-oxygenation with 100% oxygen Intubation Type: IV induction Ventilation: Mask ventilation without difficulty LMA: LMA inserted LMA Size: 4.0 Number of attempts: 1 Airway Equipment and Method: Bite block Placement Confirmation: positive ETCO2 and breath sounds checked- equal and bilateral Tube secured with: Tape Dental Injury: Teeth and Oropharynx as per pre-operative assessment

## 2014-09-15 NOTE — H&P (Signed)
  Debbie Martinez 09/08/2014 1:46 PM Location: [facility] Patient #: [id number] DOB: [birth year] Single / Language: Cleophus Molt / Race: Black or African American Female  History of Present Illness [name]. [name] MD; 09/14/2014 11:42 AM) Patient words: po lumpectomy.  The patient is a 39 year old female who presents for a follow-up for Breast cancer. The patient is a 39 year old black female who is 2 weeks status post right lumpectomy and sentinel node mapping for a T2 N2a right breast cancer. She had 4 of 4 positive lymph nodes from the right axilla. She is part of the alliance trial and was randomized to radiation therapy to the right axilla. She also had a close superior margin and a focally involved inferior margin. She tolerated the surgery well and has no complaints today.   Problem List/Past Medical [name], [place]; 09/08/2014 1:47 PM) MALIGNANT NEOPLASM OF UPPER-OUTER QUADRANT OF RIGHT FEMALE BREAST (174.4  C50.411) BREAST CANCER, RIGHT (174.9  C50.911)  Other Problems [name], CMA; 09/08/2014 1:47 PM) Back Pain High blood pressure Lump In Breast  Past Surgical History [name], [place]; 09/08/2014 1:47 PM) Oral Surgery  Diagnostic Studies History [name], [place]; 09/08/2014 1:47 PM) Colonoscopy never Mammogram within last year Pap Smear 1-5 years ago  Allergies [name], CMA; 09/08/2014 1:47 PM) No Known Drug Allergies04/07/2014  Medication History [name], CMA; 09/08/2014 1:47 PM) LORazepam (0.5MG  Tablet, Oral) Active. Atenolol (50MG  Tablet, Oral) Active. Ferrous Sulfate Active. Medications Reconciled  Social History [name], [place]; 09/08/2014 1:47 PM) Alcohol use Occasional alcohol use. Caffeine use Tea. No drug use Tobacco use Never smoker.  Family History [name], [place]; 09/08/2014 1:47 PM) Hypertension Father.  Pregnancy / Birth History [name], [place];  09/08/2014 1:47 PM) Age at menarche 77 years. Contraceptive History Oral contraceptives. Gravida 1 Maternal age 44-30 Para 1 Regular periods  Vitals Sharyn Lull R. Brooks CMA; 09/08/2014 1:47 PM) 09/08/2014 1:46 PM Weight: 186.13 lb Height: 67in Body Surface Area: 2 m Body Mass Index: 29.15 kg/m BP: 136/94 (Sitting, Left Arm, Standard)    Physical Exam [name] [name] MD; 09/14/2014 11:43 AM) Breast Note: The right breast and axillary incisions are healing nicely with no sign of infection or significant seroma     Assessment & Plan [name] [name] MD; 09/08/2014 2:09 PM) PRIMARY CANCER OF UPPER INNER QUADRANT OF RIGHT FEMALE BREAST (174.2  C50.211) Impression: The patient is 2 weeks status post right lumpectomy and sentinel node mapping for a right breast cancer. She is on the [facility] study. At this point I think she should have the inferior and superior margins of the lumpectomy reexcised. The plan is for no further surgery in the axilla since she has been randomized to the radiation arm of the study. I will plan to see her back about 2 weeks after the next surgery.     Signed by Luella Cook, MD (09/14/2014 11:43 AM)

## 2014-09-15 NOTE — Transfer of Care (Signed)
Immediate Anesthesia Transfer of Care Note  Patient: Debbie Martinez  Procedure(s) Performed: Procedure(s): RE-EXCISION OF RIGHT BREAST CANCER,SUPERIOR AND INFERIOR MARGINS (Right)  Patient Location: PACU  Anesthesia Type:General  Level of Consciousness: sedated  Airway & Oxygen Therapy: Patient Spontanous Breathing and Patient connected to face mask oxygen  Post-op Assessment: Report given to RN and Post -op Vital signs reviewed and stable  Post vital signs: Reviewed and stable  Last Vitals:  Filed Vitals:   09/15/14 1312  BP:   Pulse: 68  Temp:   Resp: 8    Complications: No apparent anesthesia complications

## 2014-09-15 NOTE — Discharge Instructions (Signed)

## 2014-09-15 NOTE — Anesthesia Postprocedure Evaluation (Signed)
  Anesthesia Post-op Note  Patient: Chief Technology Officer  Procedure(s) Performed: Procedure(s) (LRB): RE-EXCISION OF RIGHT BREAST CANCER,SUPERIOR AND INFERIOR MARGINS (Right)  Patient Location: PACU  Anesthesia Type: General  Level of Consciousness: awake and alert   Airway and Oxygen Therapy: Patient Spontanous Breathing  Post-op Pain: mild  Post-op Assessment: Post-op Vital signs reviewed, Patient's Cardiovascular Status Stable, Respiratory Function Stable, Patent Airway and No signs of Nausea or vomiting  Last Vitals:  Filed Vitals:   09/15/14 1330  BP: 124/66  Pulse: 62  Temp:   Resp: 15    Post-op Vital Signs: stable   Complications: No apparent anesthesia complications

## 2014-09-15 NOTE — Op Note (Signed)
09/15/2014  1:06 PM  PATIENT:  Debbie Martinez  39 y.o. female  PRE-OPERATIVE DIAGNOSIS:  Right Breast Cancer  POST-OPERATIVE DIAGNOSIS:  Right Breast Cancer  PROCEDURE:  Procedure(s): RE-EXCISION OF RIGHT BREAST CANCER SUPERIOR AND Anterior margin from 55 o clock cancer and INFERIOR MARGIN of 2 o clock cancer (Right)  SURGEON:  Surgeon(s) and Role:    * [name], MD - Primary  PHYSICIAN ASSISTANT:   ASSISTANTS: none   ANESTHESIA:   general  EBL:  Total I/O In: 1000 [I.V.:1000] Out: -   BLOOD ADMINISTERED:none  DRAINS: none   LOCAL MEDICATIONS USED:  MARCAINE     SPECIMEN:  Source of Specimen:  right breast inferior margin of 2 oclock cancer and superior and anterior margins of 12 o clock cancer  DISPOSITION OF SPECIMEN:  PATHOLOGY  COUNTS:  YES  TOURNIQUET:  * No tourniquets in log *  DICTATION: .Dragon Dictation  After informed consent was obtained the patient was brought to the operating room and placed in the supine position on the operating room table. After adequate induction of general anesthesia the patient's right breast was prepped with ChloraPrep, allowed to dry, and draped in usual sterile manner. The previous incision was opened sharply with a 15 blade knife and electrocautery until the seroma cavity was entered. The inferior margin of the main cavity was excised sharply with the electrocautery and marked with a short single stitch on the new inferior margin, a long single stitch on the lateral margin, and a double stitch on the anterior margin. Next the 12:00 cavity was opened with the electrocautery. The anterior margin of this cavity was excised sharply with the electrocautery and this margin was marked with a silk stitch. The superior surface of this cavity was also excised sharply with the electrocautery and marked with a stitch on the new true surgical margin as well. All of these specimens were then sent to pathology. Hemostasis was achieved using  the Bovie electrocautery. The wound was irrigated with copious amounts of saline and infiltrated with quarter percent Marcaine. The cavities were marked with clips again. The deep layers were then closed with interrupted 3-0 Vicryl stitches. The skin was then closed with interrupted 4-0 Monocryl subcuticular stitches. Dermabond dressings were applied. The patient tolerated the procedure well. At the end of the case all needle sponge and instrument counts were correct. The patient was then awakened and taken to recovery in stable condition.  PLAN OF CARE: Discharge to home after PACU  PATIENT DISPOSITION:  PACU - hemodynamically stable.   Delay start of Pharmacological VTE agent (>24hrs) due to surgical blood loss or risk of bleeding: not applicable

## 2014-09-15 NOTE — Anesthesia Preprocedure Evaluation (Signed)
Anesthesia Evaluation  Patient identified by MRN, date of birth, ID band Patient awake    Reviewed: Allergy & Precautions, NPO status , Patient's Chart, lab work & pertinent test results  Airway Mallampati: II  TM Distance: >3 FB Neck ROM: Full    Dental no notable dental hx.    Pulmonary neg pulmonary ROS,  breath sounds clear to auscultation  Pulmonary exam normal       Cardiovascular hypertension, Pt. on home beta blockers and Pt. on medications Normal cardiovascular examRhythm:Regular Rate:Normal     Neuro/Psych Anxiety negative neurological ROS     GI/Hepatic negative GI ROS, Neg liver ROS,   Endo/Other    Renal/GU negative Renal ROS  negative genitourinary   Musculoskeletal negative musculoskeletal ROS (+)   Abdominal   Peds negative pediatric ROS (+)  Hematology negative hematology ROS (+)   Anesthesia Other Findings   Reproductive/Obstetrics negative OB ROS                             Anesthesia Physical Anesthesia Plan  ASA: II  Anesthesia Plan: General   Post-op Pain Management:    Induction: Intravenous  Airway Management Planned: LMA  Additional Equipment:   Intra-op Plan:   Post-operative Plan: Extubation in OR  Informed Consent: I have reviewed the patients History and Physical, chart, labs and discussed the procedure including the risks, benefits and alternatives for the proposed anesthesia with the patient or authorized representative who has indicated his/her understanding and acceptance.   Dental advisory given  Plan Discussed with: CRNA and Surgeon  Anesthesia Plan Comments:         Anesthesia Quick Evaluation

## 2014-09-15 NOTE — Interval H&P Note (Signed)
History and Physical Interval Note:  09/15/2014 11:48 AM  Debbie Martinez  has presented today for surgery, with the diagnosis of Right Breast Cancer  The various methods of treatment have been discussed with the patient and family. After consideration of risks, benefits and other options for treatment, the patient has consented to  Procedure(s): RE-EXCISION OF RIGHT BREAST CANCER,SUPERIOR AND INFERIOR MARGINS (Right) as a surgical intervention .  The patient's history has been reviewed, patient examined, no change in status, stable for surgery.  I have reviewed the patient's chart and labs.  Questions were answered to the patient's satisfaction.     TOTH III,Parish Augustine S

## 2014-09-16 ENCOUNTER — Encounter (HOSPITAL_BASED_OUTPATIENT_CLINIC_OR_DEPARTMENT_OTHER): Payer: Self-pay | Admitting: General Surgery

## 2014-09-17 NOTE — Progress Notes (Addendum)
Location of Breast Cancer: Right Breast Upper Outer Quadrant (2 breast masses)  Histology per Pathology Report:Diagnosis 01/13/15: 1. Breast, right, needle core biopsy, 2 o'clock - INVASIVE DUCTAL CARCINOMA.- DUCTAL CARCINOMA IN SITU.- SEE COMMENT. 2. Lymph node, needle/core biopsy, right axillary- METASTATIC CARCINOMA IN 1 OF 1 LYMPH NODE (1/1).  Past/Anticipated interventions by surgeon, if any:Dr. Autumn Messing, III MD, 09/15/14:  Re-Excision of Right Breast Cancer Superior and Anterior margin from 12 o'clock cancer and inferior of 2 o'clock cancer,    Diagnosis 08/23/14 : 1. Lymph node, sentinel, biopsy, Right #1- METASTATIC CARCINOMA IN 1 OF 1 LYMPH NODE (1/1). 2. Lymph node, sentinel, biopsy, Right #2- METASTATIC CARCINOMA IN 1 OF 1 LYMPH NODE (1/1), WITH EXTRACAPSULAR EXTENSION. 3. Lymph node, sentinel, biopsy, Right #3- METASTATIC CARCINOMA IN 1 OF 1 LYMPH NODE (1/1), WITH EXTRACAPSULAR EXTENSION. 4. Lymph node, sentinel, biopsy, Right #4- METASTATIC CARCINOMA IN 1 OF 1 LYMPH NODE (1/1), WITH EXTRACAPSULAR EXTENSION. 5. Breast, lumpectomy, Right 2 o'clock- INVASIVE DUCTAL CARCINOMA, GRADE III/III, SPANNING 3.2 CM.- DUCTAL CARCINOMA IN SITU, HIGH GRADE.- LYMPHOVASCULAR INVASION IS IDENTIFIED.- PERINEURAL INVASION IS IDENTIFIED.- INVASIVE CARCINOMA IS FOCALLY PRESENT AT THE ANTERIOR/INFERIOR MARGIN OF SPECIMEN # 5.- SEE ONCOLOGY TABLE BELOW. 6. Breast, lumpectomy, Right 12 o'clock- INVASIVE DUCTAL CARCINOMA, GRADE III/III, SPANNING 0.8 CM.- DUCTAL CARCINOMA IN SITU, HIGH GRADE.- LYMPHOVASCULAR INVASION IS IDENTIFIED.- SEE ONCOLOGY TABLE BELOW. 7. Breast, excision, Right 12 o'clock additional anterior margin- DUCTAL CARCINOMA IN SITU, HIGH GRADE.- DUCTAL CARCINOMA IN SITU IS FOCALLY 0.1 CM TO THE NEW ANTERIOR MARGIN. 8. Breast, excision, Right 12 o'clock additional superior margin- DUCTAL CARCINOMA IN SITU, HIGH GRADE.- DUCTAL CARCINOMA IN SITU IS FOCALLY LESS THAN 0.1 CM TO THE NEW SUPERIOR  MARGIN. Microscopic Comment 5. BREAST, INVASIVE TUMOR, WITH LYMPH NODES PRESENT Specimen, including laterality and lymph node sampling (sentinel, non-sentinel): Right breast and right axillarysentinel lymph nodes. Procedure: Two lumpectomies, additional margin resections, and multiple lymph node resections.Histologic type: Ductal.Grade: III,(  4/4 lymph nodes +) Dr. Eddie Dibbles Toth,III<Md  Receptor Status: ER(90%+), PR (40+), Her2-neu (neg)  Did patient present with symptoms (if so, please note symptoms) or was this found on screening mammography?: patient felt  A mass right breast   Back in August 2015   Past/Anticipated interventions by surgeon, if any: recent double lumpectomies and sentinel lymph node study   Past/Anticipated interventions by medical oncology, if any: Chemotherapy Per Dr. Lindi Adie: 1. Adjuvant radiation 2. Followed by adjuvant antiestrogen therapy I discussed with her that complete ovarian suppression with Zoladex plus anastrozole would be an ideal anti-estrogen therapy approach. The other option would be to consider oophorectomy.  We will discuss this further when she is ready to start treatment.when she returns back and went off months we will consider enrollment in [place] clinical trial as well.   Lymphedema issues, if any:  no   Pain issues, if any: no  SAFETY ISSUES: NO  Prior radiation? no  Pacemaker/ICD? NO  Possible current pregnancy? NO  Is the patient on methotrexate? NO  Current Complaints / other details: menarche  Age 39, G36P1, ,Oral contraceptives, regular menses, Grandmother breast cancer  Reports having some breast tenderness to the right side, and pruritus.      Debbie Eaton, RN 09/17/2014,10:36 AM

## 2014-09-22 ENCOUNTER — Ambulatory Visit
Admission: RE | Admit: 2014-09-22 | Discharge: 2014-09-22 | Disposition: A | Discharge status: Home / Self Care | Payer: Medicaid Other | Source: Ambulatory Visit | Attending: Radiation Oncology | Admitting: Radiation Oncology

## 2014-09-22 ENCOUNTER — Encounter: Payer: Self-pay | Admitting: Radiation Oncology

## 2014-09-22 VITALS — BP 124/83 | HR 95 | Temp 98.4°F | Resp 12 | Wt 187.9 lb

## 2014-09-22 DIAGNOSIS — Z79899 Other long term (current) drug therapy: Secondary | ICD-10-CM | POA: Insufficient documentation

## 2014-09-22 DIAGNOSIS — Z9221 Personal history of antineoplastic chemotherapy: Secondary | ICD-10-CM | POA: Insufficient documentation

## 2014-09-22 DIAGNOSIS — C50211 Malignant neoplasm of upper-inner quadrant of right female breast: Secondary | ICD-10-CM | POA: Insufficient documentation

## 2014-09-22 DIAGNOSIS — Z170 Estrogen receptor positive status [ER+]: Secondary | ICD-10-CM | POA: Insufficient documentation

## 2014-09-22 DIAGNOSIS — Z510 Encounter for antineoplastic radiation therapy: Secondary | ICD-10-CM | POA: Insufficient documentation

## 2014-09-22 NOTE — Addendum Note (Signed)
Encounter addended by: Jenene Slicker, RN on: 09/22/2014  1:09 PM<BR>     Documentation filed: [facility] Follow-up Actions, Flowsheet VN, Dx Association, Orders

## 2014-09-22 NOTE — Progress Notes (Addendum)
Radiation Oncology         [phone] ________________________________  Name: Debbie Martinez MRN: [id number]  Date: 09/22/2014  DOB: 02/17/1976  Follow-Up Visit Note  outpatient   ICD-9-CM ICD-10-CM   1. 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  Narrative:  She underwent neoadjuvant chemo in the form of AC dose dense and weekly Taxol, completed on 07/01/2014. The patient returns today for routine follow-up. The patient underwent lumpectomy and SLN bx by Dr. [name]. Her pathology was reviewed at tumor board. She had findings as above. One tumor was 3.3 cm and another was 0.8 cm. 4/4 Sentinel nodes were positive. She had re-excision with no residual malignancy revealed on 09/15/2014. Since she is on the [facility] Trial she will not undergo an axillary dissection. "It itches," patient stated when referring to her scar from treatment. She stated that she has not experienced swelling of the arms. Patient confirms not currently pregnant.  ALLERGIES:  is allergic to lisinopril.  Meds: Current Outpatient Prescriptions  Medication Sig Dispense Refill  . atenolol (TENORMIN) 50 MG tablet Take 50 mg by mouth daily.    Marland Kitchen ENSURE (ENSURE) Take 237 mLs by mouth 2 (two) times daily between meals.     . ferrous sulfate 325 (65 FE) MG tablet Take 325 mg by mouth daily with breakfast.    . LORazepam (ATIVAN) 0.5 MG tablet TAKE 1 TABLET EVERY 6 HOURS AS NEEDED ANXIETY 30 tablet 0  . oxyCODONE-acetaminophen (ROXICET) 5-325 MG per tablet Take 1-2 tablets by mouth every 4 (four) hours as needed. 50 tablet 0  . oxyCODONE-acetaminophen (ROXICET) 5-325 MG per tablet Take 1-2 tablets by mouth every 4 (four) hours as  needed. 50 tablet 0   No current facility-administered medications for this encounter.    Physical Findings: The patient is in no acute distress. Patient is alert and oriented. There is no swelling of the arms.  weight is 187 lb 14.4 oz (85.231 kg). Her oral temperature is 98.4 F (36.9 C). Her blood pressure is 124/83 and her pulse is 95. Her respiration is 12 and oxygen saturation is 100%. .     General: Alert and oriented, in no acute distress HEENT: Head is normocephalic. Extraocular movements are intact. Oropharynx is clear. Neck: Neck is supple, no palpable cervical or supraclavicular lymphadenopathy. Heart: Regular in rate and rhythm with no murmurs, rubs, or gallops. Chest: Clear to auscultation bilaterally, with no rhonchi, wheezes, or rales. Abdomen: Soft, nontender, nondistended, with no rigidity or guarding. Extremities: No cyanosis or edema. Limited range of motion in the right shoulder. Right axillary scar has healed well. Lymphatics: see Neck Exam Skin: No concerning lesions. Musculoskeletal: symmetric strength and muscle tone throughout. Neurologic: Cranial nerves II through XII are grossly intact. No obvious focalities. Speech is fluent. Coordination is intact. Psychiatric: Judgment and insight are intact. Affect is appropriate. Not currently exhibiting symptoms of depression. Breast: Upper inner quadrant of the right breast, healing is taking place from scar caused by lumpectomy procedure. The left breast has no palpable masses. The left chest contains a portacath.    Lab Findings: Lab Results  Component Value Date   WBC 4.7 08/20/2014   HGB 12.0 08/20/2014  HCT 37.9 08/20/2014   MCV 87.1 08/20/2014   PLT 217 08/20/2014    Radiographic Findings: Mm Breast Surgical Specimen  08/23/2014   CLINICAL DATA:  39 year old female with right breast cancer with invasive ductal carcinoma in the upper inner right breast at 2 o'clock as well as in the superior right breast  at the approximate 12 to 1 o'clock position.  EXAM: SPECIMEN RADIOGRAPH OF THE RIGHT BREAST  COMPARISON:  Previous exam(s).  FINDINGS: Status post excision of the right breast.  1. The wire tip and paper clip shaped biopsy marker clip are present and are marked for pathology.  2. The wire tip and coil shaped biopsy marking clip are present and are marked for pathology.  IMPRESSION: Specimen radiographs of the right breast.   Electronically Signed   By: Everlean Alstrom M.D.   On: 08/23/2014 12:33   Mm Breast Surgical Specimen  08/23/2014   CLINICAL DATA:  39 year old female with right breast cancer with invasive ductal carcinoma in the upper inner right breast at 2 o'clock as well as in the superior right breast at the approximate 12 to 1 o'clock position.  EXAM: SPECIMEN RADIOGRAPH OF THE RIGHT BREAST  COMPARISON:  Previous exam(s).  FINDINGS: Status post excision of the right breast.  1. The wire tip and paper clip shaped biopsy marker clip are present and are marked for pathology.  2. The wire tip and coil shaped biopsy marking clip are present and are marked for pathology.  IMPRESSION: Specimen radiographs of the right breast.   Electronically Signed   By: Everlean Alstrom M.D.   On: 08/23/2014 12:33    Impression/Plan:  Referral to physical therapy to address limited range of motion of the right shoulder. [facility] appointment scheduled for June 27th 2016, 8am (morning as patient request) for right breast. SIM made aware of patient involvement with the [facility]. Anticipate 6-7 weeks of Radiation treatment to breast and internal mammary, SCV, and axillary nodes.  Consent has been signed.    This document serves as a record of services personally performed by [name], MD. It was created on her behalf by Lenn Cal, a trained medical scribe. The creation of this record is based on the scribe's personal observations and the provider's statements to them. This document has been checked and  approved by the attending provider.  _____________________________________   [name], MD

## 2014-09-23 ENCOUNTER — Ambulatory Visit: Payer: Medicaid Other | Attending: Radiation Oncology | Admitting: Physical Therapy

## 2014-09-23 DIAGNOSIS — Z9189 Other specified personal risk factors, not elsewhere classified: Secondary | ICD-10-CM | POA: Diagnosis not present

## 2014-09-23 DIAGNOSIS — M25611 Stiffness of right shoulder, not elsewhere classified: Secondary | ICD-10-CM | POA: Diagnosis not present

## 2014-09-23 NOTE — Therapy (Signed)
[place], [place], [address] Phone: [phone]   Fax:  [phone]  Physical Therapy Evaluation  Patient Details  Name: Debbie Martinez MRN: [id number] Date of Birth: August 04, 1975 Referring Provider:  [name], MD  Encounter Date: 09/23/2014      PT End of Session - 09/23/14 1626    Visit Number 1   Number of Visits 4   Date for PT Re-Evaluation 10/23/14   PT Start Time 2505   PT Stop Time 1515   PT Time Calculation (min) 30 min   Activity Tolerance Patient tolerated treatment well   Behavior During Therapy [facility] for tasks assessed/performed      Past Medical History  Diagnosis Date  . Hypertension   . Breast cancer 01/20/14    triple positive  . Anxiety     gets sweaty and short of breath  . Shortness of breath     'anxiety'  . GERD (gastroesophageal reflux disease)     does not take anything  . Gestational diabetes     Past Surgical History  Procedure Laterality Date  . Keloid excision Left     ear  . Portacath placement Left 01/26/2014    Procedure: INSERTION PORT-A-CATH;  Surgeon: Autumn Messing III, MD;  Location: [place];  Service: General;  Laterality: Left;  . Cesarean section      2008  . Breast lumpectomy with needle localization and axillary sentinel lymph node bx Right 08/23/2014    Procedure: RIGHT BREAST LUMPECTOMY WITH NEEDLE LOCALIZATION(X'S 2)AND RIGHT AXILLARY SENTINEL LYMPH NODE Biopies;  Surgeon: Autumn Messing III, MD;  Location: [place];  Service: General;  Laterality: Right;  . Re-excision of breast cancer,superior margins Right 09/15/2014    Procedure: RE-EXCISION OF RIGHT BREAST CANCER,SUPERIOR AND INFERIOR MARGINS;  Surgeon: Autumn Messing III, MD;  Location: [place];  Service: General;  Laterality: Right;    There were no vitals filed for this visit.  Visit Diagnosis:  Stiffness of joint, shoulder region, right  At risk for lymphedema      Subjective  Assessment - 09/23/14 1448    Subjective pt report her breast is still tender and she is fatigued. She plans to have radiation soon "it feels tight"  It just feels funny    Patient is accompained by: Family member   Pertinent History 2 lumpectomies, one on May 2 with 4 nodes removed and another on May 25 She did chemo from October  til first week April  with some neuropathy symptoms but tingling is not as bad as it was    Patient Stated Goals to be able to get radiation therapy    Currently in Pain? Yes   Pain Score 5    Pain Location Breast   Pain Orientation Right   Pain Descriptors / Indicators Tightness;Throbbing   Pain Type Chronic pain   Pain Radiating Towards breast to arm    Pain Onset 1 to 4 weeks ago   Pain Frequency Intermittent   Aggravating Factors  sleeping it   Pain Relieving Factors ice pack or medicine    Effect of Pain on Daily Activities can't sleep on right side             [facility] PT Assessment - 09/23/14 0001    Assessment   Medical Diagnosis breast cancer    Onset Date/Surgical Date 11/21/13   Precautions   Precautions Other (comment)  cancer    Restrictions   Weight  Bearing Restrictions No   Balance Screen   Has the patient fallen in the past 6 months No   Has the patient had a decrease in activity level because of a fear of falling?  No   Is the patient reluctant to leave their home because of a fear of falling?  No   Home Environment   Living Environment Private residence   Living Arrangements Children  31 year old    Available Help at Discharge Available PRN/intermittently   Prior Function   Level of Independence Independent   Cognition   Overall Cognitive Status Within Functional Limits for tasks assessed   Observation/Other Assessments   Observations well healed incision in right axilla, healing incision on right breast    Skin Integrity glue on incision at right breast   Observation/Other Assessments-Edema    Edema --  possible edema at  right chest/breast/axilla   Sensation   Light Touch Appears Intact   Coordination   Gross Motor Movements are Fluid and Coordinated Yes   Posture/Postural Control   Posture/Postural Control Postural limitations   Postural Limitations Rounded Shoulders;Forward head   AROM   Right Shoulder Flexion 124 Degrees   Right Shoulder ABduction 95 Degrees   Left Shoulder Flexion 140 Degrees   Left Shoulder ABduction 135 Degrees   Strength   Overall Strength Deficits   Overall Strength Comments appears generally deconditioned.  right shoulder 3-/5 limited by pain           LYMPHEDEMA/ONCOLOGY QUESTIONNAIRE - 09/23/14 1503    Right Upper Extremity Lymphedema   10 cm Proximal to Olecranon Process 33 cm   Olecranon Process 26.4 cm   15 cm Proximal to Ulnar Styloid Process 25.3 cm   Just Proximal to Ulnar Styloid Process 16.6 cm   Across Hand at [employer] 20 cm   At [place] of 2nd Digit 6.4 cm   Left Upper Extremity Lymphedema   10 cm Proximal to Olecranon Process 33 cm   Olecranon Process 27 cm   15 cm Proximal to Ulnar Styloid Process 26 cm   Just Proximal to Ulnar Styloid Process 16.2 cm   Across Hand at [employer] 18.8 cm   At [place] of 2nd Digit 6 cm           Quick Dash - 09/23/14 0001    Open a tight or new jar Mild difficulty   Do heavy household chores (wash walls, wash floors) Mild difficulty   Carry a shopping bag or briefcase No difficulty   Wash your back No difficulty   Use a knife to cut food No difficulty   Recreational activities in which you take some force or impact through your arm, shoulder, or hand (golf, hammering, tennis) No difficulty   During the past week, to what extent has your arm, shoulder or hand problem interfered with your normal social activities with family, friends, neighbors, or groups? Modererately   During the past week, to what extent has your arm, shoulder or hand problem limited your work or other regular daily activities Slightly    Arm, shoulder, or hand pain. Mild   Tingling (pins and needles) in your arm, shoulder, or hand Mild   Difficulty Sleeping Moderate difficulty   DASH Score 20.45 %             [facility] Adult PT Treatment/Exercise - 09/23/14 0001    Shoulder Exercises: Supine   Other Supine Exercises cane exercise in flexion and abduction  PT Education - 09/23/14 1639    Education provided Yes   Education Details supine cane flexion and abduction exercise   Person(s) Educated Patient   Methods Explanation;Demonstration;Handout   Comprehension Verbalized understanding;Returned demonstration                [facility] Goals - 09/23/14 1635    CC Long Term Goal  #1   Title Patient with verbalize an understanding of lymphedema risk reduction precautions   Baseline no knowledge   Time 3   Period --  authorizaion period   Status New   CC Long Term Goal  #2   Title Patient will be independent in a  home exercise program   Baseline no knowledge   Time 3   Period Weeks   Status New   CC Long Term Goal  #3   Title Patient will improve shoulder flexion range of motion to 140 degrees to be able to tolerate radiation treatment   Baseline no knowledge   Time 3   Period Weeks   Status New            Plan - 09/23/14 1627    Clinical Impression Statement 39 yo female with long course of chemotherapy and 2 lumpectomy surgeries with 4 nodes removed has upper quadrant pain and decreased shoulder range of motion that is currently liimiting her from getting radiation therapy   Pt will benefit from skilled therapeutic intervention in order to improve on the following deficits Decreased knowledge of precautions;Decreased strength;Postural dysfunction;Decreased knowledge of use of DME;Decreased range of motion;Pain;Decreased skin integrity   Rehab Potential Good   Clinical Impairments Affecting Rehab Potential cemotherapy with neuropathy   PT Frequency Other (comment)   3 visits   PT Duration Other (comment)  authorizaion period   PT Treatment/Interventions ADLs/Self Care Home Management;DME Instruction;Therapeutic activities;Therapeutic exercise;Manual lymph drainage;Passive range of motion;Patient/family education;Manual techniques   PT Next Visit Plan Progress HEP, active an passive range of motion   PT Home Exercise Plan supine cane exercise, issue flyer for ABC class   Recommended Other Services ABC class   Consulted and Agree with Plan of Care Patient         Problem List Patient Active Problem List   Diagnosis Date Noted  . Chemotherapy induced nausea and vomiting 03/25/2014  . 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 Constipation 03/25/2014  . Breast cancer of upper-inner quadrant of right female breast 01/15/2014   Donato Heinz. Owens Shark, PT   09/23/2014, 4:40 PM  [place], [place], [address] Phone: [phone]   Fax:  [phone]

## 2014-09-23 NOTE — Patient Instructions (Signed)
  Cane Overhead - Supine  Hold cane at thighs with both hands, extend arms straight over head. Hold ___ seconds. Repeat ___ times. Do ___ times per day.  External Rotation (Eccentric), Active-Assist - Supine (Cane)  Lie on back, affected arm out from side, elbow at 90, forearm forward. Use cane to assist in lifting forearm of affected arm to neutral. Slowly lower for 3-5 seconds. ___ reps per set, ___ sets per day, ___ days per week. Add ___ lbs when you achieve ___ repetitions.  Copyright  VHI. All rights reserved.

## 2014-09-24 ENCOUNTER — Encounter: Payer: Self-pay | Admitting: *Deleted

## 2014-09-24 NOTE — Progress Notes (Signed)
[place] Psychosocial Distress Screening Clinical Social Work  Clinical Social Work was referred by distress screening protocol.  The patient scored a 5 on the Psychosocial Distress Thermometer which indicates moderate distress. Clinical Social Worker phoned pt to assess for distress and other psychosocial needs. Pt very open with CSW and was very appreciative of the call. Pt shared she is having concerns with finances due to not working currently. Pt currently receives MCD and food stamps.CSW reviewed resources to assist, such as [place] and [employer]. Pt states she was approved for the grant and had forgotten that she still had funds left. She plans to access that as well. She just completed surgery and starts radiation in one month. Pt feels her anxiety has decreased due to her medication assisting with this concern. She has attended support groups and healing arts here in the past and plans to reconsider these as additional coping supports. Pt plans to contact [facility] and will reach out to CSW once she receives application. Pt very appreciative of call and aware of how to reach [place] team as needed.   ONCBCN DISTRESS SCREENING 09/22/2014  Screening Type Initial Screening  Distress experienced in past week (1-10) 5  Practical problem type Housing;Work/school  Emotional problem type Nervousness/Anxiety  Information Concerns Type   Physical Problem type Constipation/diarrhea;Skin dry/itchy  Referral to clinical social work   Referral to support programs   Other     Clinical Social Worker follow up needed: Yes.    If yes, follow up plan: See above Debbie Martinez, [place] Worker [place]  [facility] Phone: [phone] Fax: [phone]

## 2014-10-04 ENCOUNTER — Encounter: Payer: Self-pay | Admitting: Physical Therapy

## 2014-10-04 ENCOUNTER — Ambulatory Visit: Payer: Medicaid Other | Admitting: Physical Therapy

## 2014-10-04 DIAGNOSIS — M25611 Stiffness of right shoulder, not elsewhere classified: Secondary | ICD-10-CM

## 2014-10-04 DIAGNOSIS — Z9189 Other specified personal risk factors, not elsewhere classified: Secondary | ICD-10-CM

## 2014-10-04 NOTE — Therapy (Signed)
[place], [place], [address] Phone: [phone]   Fax:  [phone]  Physical Therapy Treatment  Patient Details  Name: Debbie Martinez MRN: [id number] Date of Birth: Jan 20, 1976 Referring Provider:  [name], MD  Encounter Date: 10/04/2014      PT End of Session - 10/04/14 1140    Visit Number 2   Number of Visits 4   Date for PT Re-Evaluation 10/23/14   PT Start Time 1115   PT Stop Time 1158   PT Time Calculation (min) 43 min   Activity Tolerance Patient tolerated treatment well   Behavior During Therapy [facility] for tasks assessed/performed      Past Medical History  Diagnosis Date  . Hypertension   . Breast cancer 01/20/14    triple positive  . Anxiety     gets sweaty and short of breath  . Shortness of breath     'anxiety'  . GERD (gastroesophageal reflux disease)     does not take anything  . Gestational diabetes     Past Surgical History  Procedure Laterality Date  . Keloid excision Left     ear  . Portacath placement Left 01/26/2014    Procedure: INSERTION PORT-A-CATH;  Surgeon: Autumn Messing III, MD;  Location: [place];  Service: General;  Laterality: Left;  . Cesarean section      2008  . Breast lumpectomy with needle localization and axillary sentinel lymph node bx Right 08/23/2014    Procedure: RIGHT BREAST LUMPECTOMY WITH NEEDLE LOCALIZATION(X'S 2)AND RIGHT AXILLARY SENTINEL LYMPH NODE Biopies;  Surgeon: Autumn Messing III, MD;  Location: [place];  Service: General;  Laterality: Right;  . Re-excision of breast cancer,superior margins Right 09/15/2014    Procedure: RE-EXCISION OF RIGHT BREAST CANCER,SUPERIOR AND INFERIOR MARGINS;  Surgeon: Autumn Messing III, MD;  Location: [place];  Service: General;  Laterality: Right;    There were no vitals filed for this visit.  Visit Diagnosis:  Stiffness of joint, shoulder region, right  At risk for lymphedema      Subjective  Assessment - 10/04/14 1117    Subjective Patient reports she feels like she is getting better. I have some pain in my right breast.   Pertinent History 2 lumpectomies, one on May 2 with 4 nodes removed and another on May 25 She did chemo from October  til first week April  with some neuropathy symptoms but tingling is not as bad as it was    Patient Stated Goals to be able to get radiation therapy    Currently in Pain? Yes   Pain Score 5    Pain Location Breast   Pain Orientation Right   Pain Descriptors / Indicators Sharp   Pain Type Chronic pain   Pain Onset 1 to 4 weeks ago   Pain Frequency Intermittent   Aggravating Factors  Sleeping on right side   Pain Relieving Factors medicine   Effect of Pain on Daily Activities "Stabbing pain in right breast stops me sometimes"               [facility] Adult PT Treatment/Exercise - 10/04/14 0001    Shoulder Exercises: Standing   Shoulder Flexion Weight (lbs) Standing cane flexion and abduction x10 each after PT demo with verbal cues for proper technique   Other Standing Exercises Finger ladder flexion and abduction x5 each to pt tolerance   Shoulder Exercises: Pulleys   Flexion 2 minutes   ABduction  2 minutes   ABduction Limitations Verbal cues to not compensate with trunk   Shoulder Exercises: Therapy Ball   Flexion 10 reps   ABduction 10 reps   Manual Therapy   Manual Therapy Passive ROM   Passive ROM To right shoulder in supine flexion and abduction with ER to pt tolerance; significantly increased as patient was able to relax and stop guarding                PT Education - 10/04/14 1140    Education provided Yes   Education Details Standing cane flexion and abduction   Person(s) Educated Patient   Methods Explanation;Demonstration;Handout   Comprehension Verbalized understanding;Returned demonstration                [facility] Goals - 09/23/14 1635    CC Long Term Goal  #1   Title Patient with  verbalize an understanding of lymphedema risk reduction precautions   Baseline no knowledge   Time 3   Period --  authorizaion period   Status New   CC Long Term Goal  #2   Title Patient will be independent in a  home exercise program   Baseline no knowledge   Time 3   Period Weeks   Status New   CC Long Term Goal  #3   Title Patient will improve shoulder flexion range of motion to 140 degrees to be able to tolerate radiation treatment   Baseline no knowledge   Time 3   Period Weeks   Status New            Plan - 10/04/14 1206    Clinical Impression Statement Patient's ROM is visibly much better!  It is apparent she has been doing her HEP and her ROM is increased.  She is only mildly limited by pain and will soon be ready for radiation positioning.  She will benefit from 2 more sessions to progress HEP and continue ROM and PROM.   Pt will benefit from skilled therapeutic intervention in order to improve on the following deficits Decreased knowledge of precautions;Decreased strength;Postural dysfunction;Decreased knowledge of use of DME;Decreased range of motion;Pain;Decreased skin integrity   Rehab Potential Excellent   PT Treatment/Interventions ADLs/Self Care Home Management;DME Instruction;Therapeutic activities;Therapeutic exercise;Manual lymph drainage;Passive range of motion;Patient/family education;Manual techniques   PT Next Visit Plan Progress HEP, active and passive range of motion   PT Home Exercise Plan Standing cane   Consulted and Agree with Plan of Care Patient        Problem List Patient Active Problem List   Diagnosis Date Noted  . Chemotherapy induced nausea and vomiting 03/25/2014  . 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 Constipation 03/25/2014  . Breast cancer of upper-inner quadrant of right female breast 01/15/2014    Annia Friendly, PT 10/04/2014, 12:09 PM  [place], [place], [address] Phone: [phone]   Fax:  [phone]

## 2014-10-04 NOTE — Patient Instructions (Signed)
Cane Exercise: Abduction   Hold cane with right hand over end, palm-up, with other hand palm-down. Move arm out from side and up by pushing with other arm. Hold _1-2___ seconds. Repeat _10___ times. Do __2__ sessions per day.  http://gt2.exer.us/82   Copyright  VHI. All rights reserved.  Flexion (Eccentric) - Active (Cane)   Lift cane with both hands. You can hold it so your right hand is on top and left hand is on the bottom of the stick. Push it up with your left arm until you feel a good stretch in your right shoulder.  Avoid hiking shoulders. Lower cane slowly. _10__ reps per set, __2_ sets per day.  Copyright  VHI. All rights reserved.

## 2014-10-06 ENCOUNTER — Other Ambulatory Visit: Payer: Self-pay | Admitting: Oncology

## 2014-10-11 ENCOUNTER — Ambulatory Visit: Payer: Medicaid Other

## 2014-10-11 DIAGNOSIS — M25611 Stiffness of right shoulder, not elsewhere classified: Secondary | ICD-10-CM

## 2014-10-11 DIAGNOSIS — Z9189 Other specified personal risk factors, not elsewhere classified: Secondary | ICD-10-CM

## 2014-10-11 NOTE — Therapy (Signed)
[place], [place], [address] Phone: [phone]   Fax:  [phone]  Physical Therapy Treatment  Patient Details  Name: Debbie Martinez MRN: [id number] Date of Birth: May 08, 1975 Referring Provider:  [name], MD  Encounter Date: 10/11/2014      PT End of Session - 10/11/14 1043    Visit Number 3   Number of Visits 4   Date for PT Re-Evaluation 10/23/14   PT Start Time 1016   PT Stop Time 1100   PT Time Calculation (min) 44 min   Activity Tolerance Patient tolerated treatment well   Behavior During Therapy [facility] for tasks assessed/performed      Past Medical History  Diagnosis Date  . Hypertension   . Breast cancer 01/20/14    triple positive  . Anxiety     gets sweaty and short of breath  . Shortness of breath     'anxiety'  . GERD (gastroesophageal reflux disease)     does not take anything  . Gestational diabetes     Past Surgical History  Procedure Laterality Date  . Keloid excision Left     ear  . Portacath placement Left 01/26/2014    Procedure: INSERTION PORT-A-CATH;  Surgeon: Autumn Messing III, MD;  Location: [place];  Service: General;  Laterality: Left;  . Cesarean section      2008  . Breast lumpectomy with needle localization and axillary sentinel lymph node bx Right 08/23/2014    Procedure: RIGHT BREAST LUMPECTOMY WITH NEEDLE LOCALIZATION(X'S 2)AND RIGHT AXILLARY SENTINEL LYMPH NODE Biopies;  Surgeon: Autumn Messing III, MD;  Location: [place];  Service: General;  Laterality: Right;  . Re-excision of breast cancer,superior margins Right 09/15/2014    Procedure: RE-EXCISION OF RIGHT BREAST CANCER,SUPERIOR AND INFERIOR MARGINS;  Surgeon: Autumn Messing III, MD;  Location: [place];  Service: General;  Laterality: Right;    There were no vitals filed for this visit.  Visit Diagnosis:  Stiffness of joint, shoulder region, right  At risk for lymphedema      Subjective  Assessment - 10/11/14 1019    Subjective My arm keeps getting better!   Currently in Pain? No/denies            [facility] PT Assessment - 10/11/14 0001    AROM   Right Shoulder Flexion 134 Degrees   Right Shoulder ABduction 126 Degrees                     [facility] Adult PT Treatment/Exercise - 10/11/14 0001    Shoulder Exercises: Standing   Other Standing Exercises Modified downward dog on wall 5 seconds for 5 reps   Other Standing Exercises Standing against wall for bil UE abduction 5 seconds for 5 reps   Shoulder Exercises: Pulleys   Flexion 2 minutes   ABduction 2 minutes   Shoulder Exercises: Therapy Ball   Flexion 10 reps   ABduction 10 reps   Manual Therapy   Passive ROM To right shoulder in supine flexion and abduction with ER to pt tolerance; still with significant increase in PROM as patient continues to be to able to relax and stop guarding                PT Education - 10/11/14 1103    Education provided Yes   Education Details Reviewed standing cane exercises and to get ball for ball on wall stretch at home as well   Person(s)  Educated Patient   Methods Explanation;Demonstration   Comprehension Verbalized understanding;Returned demonstration                [facility] Goals - 10/11/14 1059    CC Long Term Goal  #1   Title Patient with verbalize an understanding of lymphedema risk reduction precautions  Pt taking ABC class today.   Status Partially Met   CC Long Term Goal  #2   Title Patient will be independent in a  home exercise program   Status On-going   CC Long Term Goal  #3   Title Patient will improve shoulder flexion range of motion to 140 degrees to be able to tolerate radiation treatment  134 degrees attained today 10/11/14   Status On-going            Plan - 10/11/14 1044    Clinical Impression Statement Pt has continued to make great gains and is now able to get her arm into position for radiation, just still  with tightness felt at end ROM. ROM goal almost met. Pt doing well with HEP, compliant with this. She will benefit from continuing for last treatment to decrease end ROM tightness.       Pt will benefit from skilled therapeutic intervention in order to improve on the following deficits Decreased knowledge of precautions;Decreased strength;Postural dysfunction;Decreased knowledge of use of DME;Decreased range of motion;Pain;Decreased skin integrity   Rehab Potential Excellent   Clinical Impairments Affecting Rehab Potential chemotherapy with neuropathy   PT Treatment/Interventions ADLs/Self Care Home Management;DME Instruction;Therapeutic activities;Therapeutic exercise;Manual lymph drainage;Passive range of motion;Patient/family education;Manual techniques   PT Next Visit Plan Discharge next visit and cont to progress HEP prn, active and passive range of motion   PT Home Exercise Plan Get ball for stretching on wall at home.   Consulted and Agree with Plan of Care Patient        Problem List Patient Active Problem List   Diagnosis Date Noted  . Chemotherapy induced nausea and vomiting 03/25/2014  . 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 Constipation 03/25/2014  . 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

## 2014-10-12 ENCOUNTER — Encounter: Payer: Self-pay | Admitting: *Deleted

## 2014-10-12 NOTE — Progress Notes (Signed)
Received office notes from [facility], sen to scan.

## 2014-10-18 ENCOUNTER — Encounter: Payer: Self-pay | Admitting: Radiation Oncology

## 2014-10-18 ENCOUNTER — Ambulatory Visit
Admission: RE | Admit: 2014-10-18 | Discharge: 2014-10-18 | Disposition: A | Discharge status: Home / Self Care | Payer: Medicaid Other | Source: Ambulatory Visit | Attending: Radiation Oncology | Admitting: Radiation Oncology

## 2014-10-18 VITALS — BP 128/80 | HR 58 | Temp 98.4°F | Resp 12 | Wt 190.1 lb

## 2014-10-18 DIAGNOSIS — C50211 Malignant neoplasm of upper-inner quadrant of right female breast: Secondary | ICD-10-CM

## 2014-10-18 NOTE — Progress Notes (Signed)
Pt here for CT Simulation. PAIN: She rates her pain as a 4 on a scale of 0-10. constant and "pulling"  over right axilla. SKIN: Pt right breast- positive for bruising and breast tenderness.  Pt denies edema. Reports swelling to the right ankle/foot. Has diminished.  Denies pain on dorsal flexion and extension.   OTHER: Pt complains of loss of sleep, possibly due to nerves/anxiety.  BP 128/80 mmHg  Pulse 58  Temp(Src) 98.4 F (36.9 C) (Oral)  Resp 12  Wt 190 lb 1.6 oz (86.229 kg)  SpO2 100% Wt Readings from Last 3 Encounters:  10/18/14 190 lb 1.6 oz (86.229 kg)  09/22/14 187 lb 14.4 oz (85.231 kg)  09/15/14 187 lb (84.823 kg)

## 2014-10-18 NOTE — Progress Notes (Signed)
  Radiation Oncology         [phone] ________________________________  Name: ARITHA HUCKEBA MRN: [id number]  Date: 10/18/2014  DOB: 1976/01/29  SIMULATION AND TREATMENT PLANNING NOTE    Outpatient  DIAGNOSIS:     ICD-9-CM ICD-10-CM   1. Breast cancer of upper-inner quadrant of right female breast 174.2 C50.211     NARRATIVE:  The patient was brought to the [facility].  Identity was confirmed.  All relevant records and images related to the planned course of therapy were reviewed.  The patient freely provided informed written consent to proceed with treatment after reviewing the details related to the planned course of therapy. The consent form was witnessed and verified by the simulation staff.    Then, the patient was set-up in a stable reproducible supine position for radiation therapy with her ipsilateral arm over her head, and her upper body secured in a custom-made Vac-lok device.  CT images were obtained.  Surface markings were placed.  The CT images were loaded into the planning software.    TREATMENT PLANNING NOTE: Treatment planning then occurred.  The radiation prescription was entered and confirmed.     A total of 5 medically necessary complex treatment devices were fabricated and supervised by me: 4 fields with MLCs for custom blocks to protect heart, and lungs;  and, a Vac-lok. I have requested : 3D Simulation  I have requested a DVH of the following structures: lungs, heart, esophagus, lumpectomy cavity, cord.    The patient will receive 50 Gy in 25 fractions to the right breast with 2 tangential fields.  The right internal mammary nodes will be in these fields. The supraclavicular and axillary nodes on the right will be treated to 50 Gy in 25 fractions with 2 photon fields that are opposed. This will  be followed by a boost to the lumpectomy cavity of 10 Gy in 5 fractions.  Optical Surface Tracking Plan:  Since intensity modulated radiotherapy  (IMRT) and 3D conformal radiation treatment methods are predicated on accurate and precise positioning for treatment, intrafraction motion monitoring is medically necessary to ensure accurate and safe treatment delivery. The ability to quantify intrafraction motion without excessive ionizing radiation dose can only be performed with optical surface tracking. Accordingly, surface imaging offers the opportunity to obtain 3D measurements of patient position throughout IMRT and 3D treatments without excessive radiation exposure. I am ordering optical surface tracking for this patient's upcoming course of radiotherapy.  ________________________________   Reference:  Ursula Alert, J, et al. Surface imaging-based analysis of intrafraction motion for breast radiotherapy patients.Journal of [place], n. 6, nov. 2014. ISSN [id number].  Available at: <http://www.jacmp.org/index.php/jacmp/article/view/4957>.    -----------------------------------  [name], MD

## 2014-10-18 NOTE — Progress Notes (Signed)
  Radiation Oncology         [phone] ________________________________  Name: Debbie Martinez MRN: [id number]  Date: 10/18/2014  DOB: 1975/08/03  Follow-Up Visit Note  Outpatient  CC: PROVIDER NOT IN SYSTEM  No ref. 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    Narrative:  The patient returns today for simulation.  She has no major complaints.  Has a seroma that is mildly tender.      No fevers.                ALLERGIES:  is allergic to lisinopril.  Meds: Current Outpatient Prescriptions  Medication Sig Dispense Refill  . atenolol (TENORMIN) 50 MG tablet Take 50 mg by mouth daily.    Marland Kitchen ENSURE (ENSURE) Take 237 mLs by mouth 2 (two) times daily between meals.     . ferrous sulfate 325 (65 FE) MG tablet Take 325 mg by mouth daily with breakfast.    . LORazepam (ATIVAN) 0.5 MG tablet TAKE 1 TABLET EVERY 6 HOURS AS NEEDED ANXIETY 30 tablet 0  . oxyCODONE-acetaminophen (ROXICET) 5-325 MG per tablet Take 1-2 tablets by mouth every 4 (four) hours as needed. 50 tablet 0  . oxyCODONE-acetaminophen (ROXICET) 5-325 MG per tablet Take 1-2 tablets by mouth every 4 (four) hours as needed. 50 tablet 0   No current facility-administered medications for this visit.    Physical Findings: The patient is in no acute distress. Patient is alert and oriented.   Vitals - 1 value per visit 4/32/7614  SYSTOLIC 709  DIASTOLIC 80  Pulse 58  Temperature 98.4  Respirations 12  Weight (lb) 190.1  Height   BMI 29.77  VISIT REPORT    .    General: Alert and oriented, in no acute distress HEENT: Head is normocephalic.   Neck: Neck is supple, no palpable cervical or supraclavicular lymphadenopathy. Heart: Regular in rate and rhythm with no murmurs, rubs, or gallops. Chest: Clear to auscultation bilaterally, with no rhonchi, wheezes, or rales. Abdomen: Soft, nontender,  nondistended, with no rigidity or guarding. Upper Extremities: No cyanosis or lymphedema. Lymphatics: see Neck Exam; also, no axillary adenopathy bilaterally Breasts: Grade I seroma in right breast that involves almost the entire UIQ of the breast. Skin: No concerning lesions. Ecchymosis of right inner breast skin. Psychiatric: Judgment and insight are intact. Affect is appropriate.    Lab Findings: Lab Results  Component Value Date   WBC 4.7 08/20/2014   HGB 12.0 08/20/2014   HCT 37.9 08/20/2014   MCV 87.1 08/20/2014   PLT 217 08/20/2014    Radiographic Findings: No results found.  Impression/Plan:  Proceed with simulation.  While the seroma is large, I do not see signs of infection and therefore I do not see a good reason to aspirate it.  _____________________________________   [name], MD

## 2014-10-19 ENCOUNTER — Telehealth: Payer: Self-pay | Admitting: Hematology and Oncology

## 2014-10-19 ENCOUNTER — Ambulatory Visit: Payer: Medicaid Other

## 2014-10-19 ENCOUNTER — Other Ambulatory Visit: Payer: Self-pay

## 2014-10-19 DIAGNOSIS — M25611 Stiffness of right shoulder, not elsewhere classified: Secondary | ICD-10-CM

## 2014-10-19 DIAGNOSIS — Z9189 Other specified personal risk factors, not elsewhere classified: Secondary | ICD-10-CM

## 2014-10-19 NOTE — Therapy (Addendum)
[place], [place], [address] Phone: [phone]   Fax:  [phone]  Physical Therapy Treatment  Patient Details  Name: Debbie Martinez MRN: [id number] Date of Birth: 02/25/1976 Referring Provider:  [name], MD  Encounter Date: 10/19/2014      PT End of Session - 10/19/14 1001    Visit Number 4   Number of Visits 4   Date for PT Re-Evaluation 10/23/14   PT Start Time 0945   PT Stop Time 1027   PT Time Calculation (min) 42 min   Activity Tolerance Patient tolerated treatment well   Behavior During Therapy [facility] for tasks assessed/performed      Past Medical History  Diagnosis Date  . Hypertension   . Breast cancer 01/20/14    triple positive  . Anxiety     gets sweaty and short of breath  . Shortness of breath     'anxiety'  . GERD (gastroesophageal reflux disease)     does not take anything  . Gestational diabetes     Past Surgical History  Procedure Laterality Date  . Keloid excision Left     ear  . Portacath placement Left 01/26/2014    Procedure: INSERTION PORT-A-CATH;  Surgeon: Autumn Messing III, MD;  Location: [place];  Service: General;  Laterality: Left;  . Cesarean section      2008  . Breast lumpectomy with needle localization and axillary sentinel lymph node bx Right 08/23/2014    Procedure: RIGHT BREAST LUMPECTOMY WITH NEEDLE LOCALIZATION(X'S 2)AND RIGHT AXILLARY SENTINEL LYMPH NODE Biopies;  Surgeon: Autumn Messing III, MD;  Location: [place];  Service: General;  Laterality: Right;  . Re-excision of breast cancer,superior margins Right 09/15/2014    Procedure: RE-EXCISION OF RIGHT BREAST CANCER,SUPERIOR AND INFERIOR MARGINS;  Surgeon: Autumn Messing III, MD;  Location: [place];  Service: General;  Laterality: Right;    There were no vitals filed for this visit.  Visit Diagnosis:  Stiffness of joint, shoulder region, right  At risk for lymphedema      Subjective  Assessment - 10/19/14 0948    Subjective Doing good, been doing my HEP.            [facility] PT Assessment - 10/19/14 0001    AROM   Right Shoulder Flexion 143 Degrees   Right Shoulder ABduction 154 Degrees                     [facility] Adult PT Treatment/Exercise - 10/19/14 0001    Shoulder Exercises: Pulleys   Flexion 2 minutes   ABduction 2 minutes   Shoulder Exercises: Therapy Ball   Flexion 10 reps   ABduction 10 reps   Shoulder Exercises: Stretch   Corner Stretch 5 reps;10 seconds  In doorway   Manual Therapy   Passive ROM To right shoulder in supine flexion and abduction with ER to pt tolerance; still with significant increase in PROM as patient continues to be to able to relax and stop guarding                        [place] - 10/19/14 1000    CC Long Term Goal  #1   Title Patient with verbalize an understanding of lymphedema risk reduction precautions   Status Achieved   CC Long Term Goal  #2   Title Patient will be independent in a  home exercise program  Status Achieved   CC Long Term Goal  #3   Title Patient will improve shoulder flexion range of motion to 140 degrees to be able to tolerate radiation treatment  143 degrees today 10/19/14   Status Achieved            Plan - 10/19/14 1032    Clinical Impression Statement Pt has done great with therapy and has met all goals. She will be discharged to HEP.    Pt will benefit from skilled therapeutic intervention in order to improve on the following deficits Decreased knowledge of precautions;Decreased strength;Postural dysfunction;Decreased knowledge of use of DME;Decreased range of motion;Pain;Decreased skin integrity   Rehab Potential Excellent   Clinical Impairments Affecting Rehab Potential chemotherapy with neuropathy   PT Treatment/Interventions ADLs/Self Care Home Management;DME Instruction;Therapeutic activities;Therapeutic exercise;Manual lymph drainage;Passive  range of motion;Patient/family education;Manual techniques   PT Next Visit Plan Discharge visit.    Consulted and Agree with Plan of Care Patient        Problem List Patient Active Problem List   Diagnosis Date Noted  . Chemotherapy induced nausea and vomiting 03/25/2014  . 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 Constipation 03/25/2014  . Breast cancer of upper-inner quadrant of right female breast 01/15/2014    Otelia Limes, PTA 10/19/2014, 10:35 AM     PHYSICAL THERAPY DISCHARGE SUMMARY  Visits from Start of Care: 4  Current functional level related to goals / functional outcomes Pt is able ot manage her symptoms on her own  Remaining deficits: As above   Education / Equipment: Home exercise,  self manual lymph drainage  Plan: Patient agrees to discharge.  Patient goals were met. Patient is being discharged due to meeting the stated rehab goals.  ?????        Maudry Diego, PT 05/20/2015 8:07 AM     [place], [place], [address] Phone: [phone]   Fax:  [phone]

## 2014-10-19 NOTE — Telephone Encounter (Signed)
Spoke with patient and she is aware of her follow up appointment °

## 2014-10-29 ENCOUNTER — Encounter: Payer: Self-pay | Admitting: Radiation Oncology

## 2014-11-01 NOTE — Addendum Note (Signed)
Encounter addended by: [name], MD on: 11/01/2014  2:18 PM<BR>     Documentation filed: Notes Section

## 2014-11-02 ENCOUNTER — Encounter: Payer: Self-pay | Admitting: Radiation Oncology

## 2014-11-02 ENCOUNTER — Ambulatory Visit
Admission: RE | Admit: 2014-11-02 | Discharge: 2014-11-02 | Disposition: A | Discharge status: Home / Self Care | Payer: Medicaid Other | Source: Ambulatory Visit | Attending: Radiation Oncology | Admitting: Radiation Oncology

## 2014-11-02 VITALS — Temp 98.0°F

## 2014-11-02 DIAGNOSIS — C50211 Malignant neoplasm of upper-inner quadrant of right female breast: Secondary | ICD-10-CM

## 2014-11-02 MED ORDER — ALRA NON-METALLIC DEODORANT (RAD-ONC)
1.0000 "application " | Freq: Once | TOPICAL | Status: AC
  Administered 2014-11-02: 1 via TOPICAL

## 2014-11-02 MED ORDER — DOXYCYCLINE HYCLATE 100 MG PO TABS: 100.0000 mg | ORAL_TABLET | Freq: Two times a day (BID) | ORAL | Status: DC

## 2014-11-02 MED ORDER — RADIAPLEXRX EX GEL
Freq: Once | CUTANEOUS | Status: AC
  Administered 2014-11-02: 15:00:00 via TOPICAL

## 2014-11-02 NOTE — Progress Notes (Signed)
Patient here had port only done, sent by RT Therapist questioning if can start rad tx tomorrow, checked right breat, bruising, greenish color and nickel size reddened area on breast, hard size baseball, p[atient stated she saw Dr. [name] yesterday and  Per patient statement"he said looks like it is breaking down will continue to watch and follow up", patient education done today ,rad book, alra and radiaplex gel given to patient,  My business card, fler on skin products and when to use them, dove soap unscented suggested, discussed ways to manage side effects skin irritation, pain, swelling, tenderness,  Fatigue, to increase protein in diet, drink plenty water,stay hydrated,seed MD weekly and prn, can call for any question /concerns,  Verbal understanding teach back given, will have MD see if can start treatm,ment tomorrow 2:43 PM

## 2014-11-02 NOTE — Progress Notes (Signed)
Weekly Management Note  Narrative:  The patient presents for under treatment assessment on port only day. Has redness, swelling for past 2 weeks in upper inner quadrant. No fever or chills. Saw Dr. [name] who recommended warm compresses.   Physical Findings: 2 cm warm raised area superior to scar. Rest of breast is blue around nipple.   Impression:  Abscess vs recurrence  Plan:  Will contact Dr. [name] and Dr. Isidore Moos.  Does this need to be drained? Placed patient on doxycycline bid for 10 days.

## 2014-11-03 ENCOUNTER — Ambulatory Visit
Admission: RE | Admit: 2014-11-03 | Discharge: 2014-11-03 | Disposition: A | Discharge status: Home / Self Care | Payer: Medicaid Other | Source: Ambulatory Visit | Attending: Radiation Oncology | Admitting: Radiation Oncology

## 2014-11-03 ENCOUNTER — Telehealth: Payer: Self-pay | Admitting: *Deleted

## 2014-11-03 DIAGNOSIS — C50211 Malignant neoplasm of upper-inner quadrant of right female breast: Secondary | ICD-10-CM

## 2014-11-03 NOTE — Progress Notes (Signed)
   Weekly Management Note:  Outpatient    ICD-9-CM ICD-10-CM   1. Breast cancer of upper-inner quadrant of right female breast 174.2 C50.211      ICD-9-CM ICD-10-CM   1. Breast cancer of upper-inner quadrant of right female breast 174.2 C50.211    Current Dose:  Seen prior to fraction 1 -- will be at 2Gy  Projected Dose: 60 Gy   Narrative:  The patient presents for routine under treatment assessment.  CBCT/MVCT images/Port film x-rays were reviewed.  The chart was checked. She began doxycycline yesterday. She denies drainage. When the new superficial red raised skin change first appeared, "it came up like a bite mark". She still has a large seroma/hematoma in the right breast.   Physical Findings:  vitals were not taken for this visit.  Wt Readings from Last 3 Encounters:  10/18/14 190 lb 1.6 oz (86.229 kg)  09/22/14 187 lb 14.4 oz (85.231 kg)  09/15/14 187 lb (84.823 kg)   Breast: Upper inner quadrant of the right breast continues to have a large seroma with bruising present superficially and within the area of bruising there is a soft superficial area of erythematous swelling.    Impression:  Seroma/hematoma with possible superficial infection vs cancer recurrence (less likely). We discussed the pros/ and cons of starting vs holding treatment.  I feel starting RT at this time is in her best interest. She agrees to proceed.   I do not recommend biopsy and I discussed her with Dr [name] who does not recommend drainage at this time.  Plan:  Begin radiotherapy as planned.   This document serves as a record of services personally performed by [name], MD. It was created on her behalf by Arlyce Harman, a trained medical scribe. The creation of this record is based on the scribe's personal observations and the provider's statements to them. This document has been checked and approved by the attending provider. ________________________________   [name], M.D.

## 2014-11-03 NOTE — Progress Notes (Signed)
Pt here to see doctor to assess bruising to right breast area.

## 2014-11-03 NOTE — Telephone Encounter (Signed)
Called patient home, and asked if she could come in 20 minutes before treatment time 1045 am, to see  Dr. Isidore Moos first  To decide  If she was to start treatment today, patient agreed will be here at 1025 am, thanked her and will her then, called [facility] spoke with Jeannetta Nap, notified her patient to see MD first and then will let her know if to start treatment 8:47 AM

## 2014-11-04 ENCOUNTER — Ambulatory Visit
Admission: RE | Admit: 2014-11-04 | Discharge: 2014-11-04 | Disposition: A | Discharge status: Home / Self Care | Payer: Medicaid Other | Source: Ambulatory Visit | Attending: Radiation Oncology | Admitting: Radiation Oncology

## 2014-11-05 ENCOUNTER — Ambulatory Visit
Admission: RE | Admit: 2014-11-05 | Discharge: 2014-11-05 | Disposition: A | Discharge status: Home / Self Care | Payer: Medicaid Other | Source: Ambulatory Visit | Attending: Radiation Oncology | Admitting: Radiation Oncology

## 2014-11-08 ENCOUNTER — Encounter: Payer: Self-pay | Admitting: Radiation Oncology

## 2014-11-08 ENCOUNTER — Ambulatory Visit
Admission: RE | Admit: 2014-11-08 | Discharge: 2014-11-08 | Disposition: A | Discharge status: Home / Self Care | Payer: Medicaid Other | Source: Ambulatory Visit | Attending: Radiation Oncology | Admitting: Radiation Oncology

## 2014-11-08 VITALS — BP 138/94 | HR 81 | Temp 98.5°F | Ht 67.0 in | Wt 192.3 lb

## 2014-11-08 DIAGNOSIS — C50211 Malignant neoplasm of upper-inner quadrant of right female breast: Secondary | ICD-10-CM

## 2014-11-08 NOTE — Progress Notes (Signed)
   Weekly Management Note:  Outpatient  No diagnosis found.   ICD-9-CM ICD-10-CM   1. Breast cancer of upper-inner quadrant of right female breast 174.2 C50.211    Current Dose:  8Gy  Projected Dose: 60 Gy   Narrative:  The patient presents for routine under treatment assessment.  CBCT/MVCT images/Port film x-rays were reviewed.  The chart was checked. Debbie Martinez has received 4 fractions to her right breast.  The hardened area on the right anterior breast is less firm that prior to the start of Doxycycline and she reports less tenderness.  Skin remains intact.   Physical Findings:  height is 5\' 7"  (1.702 m) and weight is 192 lb 4.8 oz (87.227 kg). Her temperature is 98.5 F (36.9 C). Her blood pressure is 138/94 and her pulse is 81.   Wt Readings from Last 3 Encounters:  11/08/14 192 lb 4.8 oz (87.227 kg)  10/18/14 190 lb 1.6 oz (86.229 kg)  09/22/14 187 lb 14.4 oz (85.231 kg)   Breast: Upper inner quadrant of the right breast continues to have a large seroma with bruising present superficially but the area of soft superficial erythematous swelling has gone away. No UE edema  Impression:  She is tolerating radiotherapy well.  Plan:  Continue radiotherapy as planned.    ________________   [name], M.D.

## 2014-11-08 NOTE — Progress Notes (Signed)
Debbie Martinez has received 4 fractions to her right breast.  The hardened area on the right anterior breast is less firm that prior to the start of Doxycycline and she reports less tenderness.  Skin remains intact.

## 2014-11-09 ENCOUNTER — Ambulatory Visit
Admission: RE | Admit: 2014-11-09 | Discharge: 2014-11-09 | Disposition: A | Discharge status: Home / Self Care | Payer: Medicaid Other | Source: Ambulatory Visit | Attending: Radiation Oncology | Admitting: Radiation Oncology

## 2014-11-10 ENCOUNTER — Ambulatory Visit
Admission: RE | Admit: 2014-11-10 | Discharge: 2014-11-10 | Disposition: A | Discharge status: Home / Self Care | Payer: Medicaid Other | Source: Ambulatory Visit | Attending: Radiation Oncology | Admitting: Radiation Oncology

## 2014-11-11 ENCOUNTER — Ambulatory Visit
Admission: RE | Admit: 2014-11-11 | Discharge: 2014-11-11 | Disposition: A | Discharge status: Home / Self Care | Payer: Medicaid Other | Source: Ambulatory Visit | Attending: Radiation Oncology | Admitting: Radiation Oncology

## 2014-11-11 ENCOUNTER — Ambulatory Visit: Payer: Medicaid Other | Admitting: Hematology and Oncology

## 2014-11-12 ENCOUNTER — Ambulatory Visit
Admission: RE | Admit: 2014-11-12 | Discharge: 2014-11-12 | Disposition: A | Discharge status: Home / Self Care | Payer: Medicaid Other | Source: Ambulatory Visit | Attending: Radiation Oncology | Admitting: Radiation Oncology

## 2014-11-15 ENCOUNTER — Ambulatory Visit
Admission: RE | Admit: 2014-11-15 | Discharge: 2014-11-15 | Disposition: A | Discharge status: Home / Self Care | Payer: Medicaid Other | Source: Ambulatory Visit | Attending: Radiation Oncology | Admitting: Radiation Oncology

## 2014-11-15 ENCOUNTER — Encounter: Payer: Self-pay | Admitting: Radiation Oncology

## 2014-11-15 VITALS — BP 131/87 | HR 67 | Temp 98.6°F | Ht 67.0 in | Wt 192.7 lb

## 2014-11-15 DIAGNOSIS — C50211 Malignant neoplasm of upper-inner quadrant of right female breast: Secondary | ICD-10-CM

## 2014-11-15 NOTE — Progress Notes (Signed)
Weekly Management Note:  Site: right breast Current Dose:   1800  cGy Projected Dose:  5000  cGy  Followed by 5 fraction boost  Narrative: The patient is seen today for routine under treatment assessment. CBCT/MVCT images/port films were reviewed. The chart was reviewed.    She is without complaints today.  She uses Radioplex gel.  Physical Examination:  Filed Vitals:   11/15/14 0955  BP: 131/87  Pulse: 67  Temp: 98.6 F (37 C)  .  Weight: 192 lb 11.2 oz (87.408 kg).  There is mild hyperpigmentation the skin along with a  large residual  Seroma/hematoma.  No erythema.  Impression: Tolerating radiation therapy well.  Plan: Continue radiation therapy as planned.

## 2014-11-15 NOTE — Progress Notes (Signed)
Debbie Martinez has received 9 fractions to her her right breast. She feels that the firm area in her breast is "somewhat softer".  She denies any pain today.

## 2014-11-16 ENCOUNTER — Ambulatory Visit
Admission: RE | Admit: 2014-11-16 | Discharge: 2014-11-16 | Disposition: A | Discharge status: Home / Self Care | Payer: Medicaid Other | Source: Ambulatory Visit | Attending: Radiation Oncology | Admitting: Radiation Oncology

## 2014-11-17 ENCOUNTER — Ambulatory Visit
Admission: RE | Admit: 2014-11-17 | Discharge: 2014-11-17 | Disposition: A | Discharge status: Home / Self Care | Payer: Medicaid Other | Source: Ambulatory Visit | Attending: Radiation Oncology | Admitting: Radiation Oncology

## 2014-11-18 ENCOUNTER — Ambulatory Visit
Admission: RE | Admit: 2014-11-18 | Discharge: 2014-11-18 | Disposition: A | Discharge status: Home / Self Care | Payer: Medicaid Other | Source: Ambulatory Visit | Attending: Radiation Oncology | Admitting: Radiation Oncology

## 2014-11-19 ENCOUNTER — Ambulatory Visit
Admission: RE | Admit: 2014-11-19 | Discharge: 2014-11-19 | Disposition: A | Discharge status: Home / Self Care | Payer: Medicaid Other | Source: Ambulatory Visit | Attending: Radiation Oncology | Admitting: Radiation Oncology

## 2014-11-22 ENCOUNTER — Ambulatory Visit
Admission: RE | Admit: 2014-11-22 | Discharge: 2014-11-22 | Disposition: A | Discharge status: Home / Self Care | Payer: Medicaid Other | Source: Ambulatory Visit | Attending: Radiation Oncology | Admitting: Radiation Oncology

## 2014-11-23 ENCOUNTER — Ambulatory Visit
Admission: RE | Admit: 2014-11-23 | Discharge: 2014-11-23 | Disposition: A | Discharge status: Home / Self Care | Payer: Medicaid Other | Source: Ambulatory Visit | Attending: Radiation Oncology | Admitting: Radiation Oncology

## 2014-11-23 ENCOUNTER — Ambulatory Visit: Admission: RE | Admit: 2014-11-23 | Payer: Medicaid Other | Source: Ambulatory Visit | Admitting: Radiation Oncology

## 2014-11-24 ENCOUNTER — Ambulatory Visit
Admission: RE | Admit: 2014-11-24 | Discharge: 2014-11-24 | Disposition: A | Discharge status: Home / Self Care | Payer: Medicaid Other | Source: Ambulatory Visit | Attending: Radiation Oncology | Admitting: Radiation Oncology

## 2014-11-24 ENCOUNTER — Encounter: Payer: Self-pay | Admitting: Radiation Oncology

## 2014-11-24 ENCOUNTER — Ambulatory Visit: Payer: Medicaid Other | Admitting: Radiation Oncology

## 2014-11-24 VITALS — BP 145/94 | HR 82 | Resp 16 | Wt 192.3 lb

## 2014-11-24 DIAGNOSIS — C50211 Malignant neoplasm of upper-inner quadrant of right female breast: Secondary | ICD-10-CM

## 2014-11-24 NOTE — Progress Notes (Signed)
   Weekly Management Note:  Outpatient    ICD-9-CM ICD-10-CM   1. Breast cancer of upper-inner quadrant of right female breast 174.2 C50.211     Current Dose:  30 Gy  Projected Dose: 60 Gy   Narrative:  The patient presents for routine under treatment assessment.  CBCT/MVCT images/Port film x-rays were reviewed.  The chart was checked. Ms. Debbie Martinez has received 15/30 fractions to her right breast. Weight is stable Reports she forgot to take bp medication this morning. Denies pain. No lymphedema noted right arm. Denies nipple discharge. Reports using radiaplex as directed.    Physical Findings:  weight is 192 lb 4.8 oz (87.227 kg). Her blood pressure is 145/94 and her pulse is 82. Her respiration is 16.   Wt Readings from Last 3 Encounters:  11/24/14 192 lb 4.8 oz (87.227 kg)  11/15/14 192 lb 11.2 oz (87.408 kg)  11/08/14 192 lb 4.8 oz (87.227 kg)   Breast: Some ecchymosis in the upper inner quadrant of the right breast. Skin intact and slightly erythematous (Grade I). Bruising has improved. Still has palpable seroma which is similar in size. No lymphedema in her arms.  Impression:  She is tolerating radiotherapy well.  Plan:  Continue radiotherapy as planned.   This document serves as a record of services personally performed by [name], MD. It was created on her behalf by Darcus Austin, a trained medical scribe. The creation of this record is based on the scribe's personal observations and the provider's statements to them. This document has been checked and approved by the attending provider.      ________________   [name], M.D.

## 2014-11-24 NOTE — Progress Notes (Signed)
Weight stable. BP elevated. Reports she forgot to take bp medication this morning.  Denies pain. No lymphedema noted right arm. Denies nipple discharge. Hyperpigmentation without desquamation of right/treated breast. Reports using radiaplex as directed.   BP 145/94 mmHg  Pulse 82  Resp 16  Wt 192 lb 4.8 oz (87.227 kg) Wt Readings from Last 3 Encounters:  11/24/14 192 lb 4.8 oz (87.227 kg)  11/15/14 192 lb 11.2 oz (87.408 kg)  11/08/14 192 lb 4.8 oz (87.227 kg)

## 2014-11-25 ENCOUNTER — Ambulatory Visit
Admission: RE | Admit: 2014-11-25 | Discharge: 2014-11-25 | Disposition: A | Discharge status: Home / Self Care | Payer: Medicaid Other | Source: Ambulatory Visit | Attending: Radiation Oncology | Admitting: Radiation Oncology

## 2014-11-26 ENCOUNTER — Ambulatory Visit
Admission: RE | Admit: 2014-11-26 | Discharge: 2014-11-26 | Disposition: A | Discharge status: Home / Self Care | Payer: Medicaid Other | Source: Ambulatory Visit | Attending: Radiation Oncology | Admitting: Radiation Oncology

## 2014-11-29 ENCOUNTER — Ambulatory Visit
Admission: RE | Admit: 2014-11-29 | Discharge: 2014-11-29 | Disposition: A | Discharge status: Home / Self Care | Payer: Medicaid Other | Source: Ambulatory Visit | Attending: Radiation Oncology | Admitting: Radiation Oncology

## 2014-11-29 ENCOUNTER — Encounter: Payer: Self-pay | Admitting: Radiation Oncology

## 2014-11-29 VITALS — BP 146/102 | HR 65 | Temp 97.8°F | Resp 20 | Wt 193.0 lb

## 2014-11-29 DIAGNOSIS — C50211 Malignant neoplasm of upper-inner quadrant of right female breast: Secondary | ICD-10-CM

## 2014-11-29 NOTE — Progress Notes (Signed)
Weekly rad rxs right breast, hard/discoloration brownish color, skin intact,   Using radiaplex bid, saw her surgeon last week ,Dr. [name],  He will see her back in 3 months, some discomfort in right breast, appetite good, b/p up this am 9:38 AM BP 146/102 mmHg  Pulse 65  Temp(Src) 97.8 F (36.6 C) (Oral)  Resp 20  Wt 193 lb (87.544 kg)  Wt Readings from Last 3 Encounters:  11/29/14 193 lb (87.544 kg)  11/24/14 192 lb 4.8 oz (87.227 kg)  11/15/14 192 lb 11.2 oz (87.408 kg)

## 2014-11-29 NOTE — Progress Notes (Signed)
   Weekly Management Note:  Outpatient    ICD-9-CM ICD-10-CM   1. Breast cancer of upper-inner quadrant of right female breast 174.2 C50.211     Current Dose:  38 Gy  Projected Dose: 60 Gy   Narrative:  The patient presents for routine under treatment assessment.  CBCT/MVCT images/Port film x-rays were reviewed.  The chart was checked.   Using radiaplex bid, saw her surgeon last week ,Dr. [name]. He will see her back in 3 months, some discomfort in right breast, appetite good, b/p up this am   Physical Findings:  weight is 193 lb (87.544 kg). Her oral temperature is 97.8 F (36.6 C). Her blood pressure is 146/102 and her pulse is 65. Her respiration is 20.   Wt Readings from Last 3 Encounters:  11/29/14 193 lb (87.544 kg)  11/24/14 192 lb 4.8 oz (87.227 kg)  11/15/14 192 lb 11.2 oz (87.408 kg)   Breast: Some ecchymosis in the upper inner quadrant of the right breast. Skin intact and  erythematous (Grade I). Bruising over right breast persists. Still has palpable seroma which is similar in size. No lymphedema in her arms.  Impression:  She is tolerating radiotherapy well.  Plan:  Continue radiotherapy as planned.      ________________   [name], M.D.

## 2014-11-30 ENCOUNTER — Ambulatory Visit
Admission: RE | Admit: 2014-11-30 | Discharge: 2014-11-30 | Disposition: A | Discharge status: Home / Self Care | Payer: Medicaid Other | Source: Ambulatory Visit | Attending: Radiation Oncology | Admitting: Radiation Oncology

## 2014-12-01 ENCOUNTER — Ambulatory Visit
Admission: RE | Admit: 2014-12-01 | Discharge: 2014-12-01 | Disposition: A | Discharge status: Home / Self Care | Payer: Medicaid Other | Source: Ambulatory Visit | Attending: Radiation Oncology | Admitting: Radiation Oncology

## 2014-12-02 ENCOUNTER — Ambulatory Visit
Admission: RE | Admit: 2014-12-02 | Discharge: 2014-12-02 | Disposition: A | Discharge status: Home / Self Care | Payer: Medicaid Other | Source: Ambulatory Visit | Attending: Radiation Oncology | Admitting: Radiation Oncology

## 2014-12-03 ENCOUNTER — Ambulatory Visit
Admission: RE | Admit: 2014-12-03 | Discharge: 2014-12-03 | Disposition: A | Discharge status: Home / Self Care | Payer: Medicaid Other | Source: Ambulatory Visit | Attending: Radiation Oncology | Admitting: Radiation Oncology

## 2014-12-06 ENCOUNTER — Ambulatory Visit
Admission: RE | Admit: 2014-12-06 | Discharge: 2014-12-06 | Disposition: A | Discharge status: Home / Self Care | Payer: Medicaid Other | Source: Ambulatory Visit | Attending: Radiation Oncology | Admitting: Radiation Oncology

## 2014-12-06 VITALS — BP 128/81 | HR 74 | Temp 98.7°F | Resp 12 | Wt 195.1 lb

## 2014-12-06 DIAGNOSIS — C50211 Malignant neoplasm of upper-inner quadrant of right female breast: Secondary | ICD-10-CM

## 2014-12-06 NOTE — Progress Notes (Signed)
   Weekly Management Note:  Outpatient    ICD-9-CM ICD-10-CM   1. Breast cancer of upper-inner quadrant of right female breast 174.2 C50.211     Current Dose:  48 Gy  Projected Dose: 60 Gy   Narrative:  The patient presents for routine under treatment assessment.  CBCT/MVCT images/Port film x-rays were reviewed.  The chart was checked.  5/10 pain, dull, in right breast. Applying Radiaplex. Fatigue.   Physical Findings:  weight is 195 lb 1.6 oz (88.497 kg). Her oral temperature is 98.7 F (37.1 C). Her blood pressure is 128/81 and her pulse is 74. Her respiration is 12 and oxygen saturation is 99%.   Wt Readings from Last 3 Encounters:  12/06/14 195 lb 1.6 oz (88.497 kg)  11/29/14 193 lb (87.544 kg)  11/24/14 192 lb 4.8 oz (87.227 kg)   Breast: Stable ecchymosis in the upper inner quadrant of the right breast. Right breast Skin intact and  hyperpigmented with impending dry desquamation at inframammary fold (Grade I).   Still has palpable seroma which is similar in size. No lymphedema in her arms.  Impression:  She is tolerating radiotherapy well.  Plan:  Continue radiotherapy as planned.  Triple antibiotic ointment given to use at IM fold.     ________________   [name], M.D.

## 2014-12-06 NOTE — Progress Notes (Signed)
PAIN: She rates her pain as a 5 on a scale of 0-10. intermittent and dull over right breast. SKIN: Pt right breast- positive for Hyperpigmentation, Pruritus, erythema, bruising to breast, breast tenderness and moist desquamation.  Pt denies edema.  Pt continues to apply Radiaplex as directed. OTHER: Pt complains of fatigue. BP 128/81 mmHg  Pulse 74  Temp(Src) 98.7 F (37.1 C) (Oral)  Resp 12  Wt 195 lb 1.6 oz (88.497 kg)  SpO2 99% Wt Readings from Last 3 Encounters:  12/06/14 195 lb 1.6 oz (88.497 kg)  11/29/14 193 lb (87.544 kg)  11/24/14 192 lb 4.8 oz (87.227 kg)

## 2014-12-07 ENCOUNTER — Ambulatory Visit
Admission: RE | Admit: 2014-12-07 | Discharge: 2014-12-07 | Disposition: A | Discharge status: Home / Self Care | Payer: Medicaid Other | Source: Ambulatory Visit | Attending: Radiation Oncology | Admitting: Radiation Oncology

## 2014-12-08 ENCOUNTER — Ambulatory Visit
Admission: RE | Admit: 2014-12-08 | Discharge: 2014-12-08 | Disposition: A | Discharge status: Home / Self Care | Payer: Medicaid Other | Source: Ambulatory Visit | Attending: Radiation Oncology | Admitting: Radiation Oncology

## 2014-12-09 ENCOUNTER — Ambulatory Visit
Admission: RE | Admit: 2014-12-09 | Discharge: 2014-12-09 | Disposition: A | Discharge status: Home / Self Care | Payer: Medicaid Other | Source: Ambulatory Visit | Attending: Radiation Oncology | Admitting: Radiation Oncology

## 2014-12-10 ENCOUNTER — Ambulatory Visit
Admission: RE | Admit: 2014-12-10 | Discharge: 2014-12-10 | Disposition: A | Discharge status: Home / Self Care | Payer: Medicaid Other | Source: Ambulatory Visit | Attending: Radiation Oncology | Admitting: Radiation Oncology

## 2014-12-13 ENCOUNTER — Ambulatory Visit
Admission: RE | Admit: 2014-12-13 | Discharge: 2014-12-13 | Disposition: A | Discharge status: Home / Self Care | Payer: Medicaid Other | Source: Ambulatory Visit | Attending: Radiation Oncology | Admitting: Radiation Oncology

## 2014-12-13 VITALS — BP 135/87 | HR 99 | Temp 98.8°F | Resp 12 | Wt 194.4 lb

## 2014-12-13 DIAGNOSIS — C50211 Malignant neoplasm of upper-inner quadrant of right female breast: Secondary | ICD-10-CM

## 2014-12-13 NOTE — Progress Notes (Signed)
PAIN: She is currently in no pain.  SKIN: Pt right breast- positive for Hyperpigmentation, Pruritus, erythema, breast tenderness and dry desquamation.  Pt denies edema.  Pt continues to apply Radiaplex as directed. OTHER: Pt complains of fatigue. BP 135/87 mmHg  Pulse 99  Temp(Src) 98.8 F (37.1 C) (Oral)  Resp 12  Wt 194 lb 6.4 oz (88.179 kg)  SpO2 100% Wt Readings from Last 3 Encounters:  12/13/14 194 lb 6.4 oz (88.179 kg)  12/06/14 195 lb 1.6 oz (88.497 kg)  11/29/14 193 lb (87.544 kg)

## 2014-12-13 NOTE — Progress Notes (Signed)
   Weekly Management Note:  Outpatient    ICD-9-CM ICD-10-CM   1. Breast cancer of upper-inner quadrant of right female breast 174.2 C50.211     Current Dose:  56Gy  -- seen before RT today Projected Dose: 60 Gy   Narrative:  The patient presents for routine under treatment assessment.  CBCT/MVCT images/Port film x-rays were reviewed.  The chart was checked.  Marland Kitchen   Physical Findings:  weight is 194 lb 6.4 oz (88.179 kg). Her oral temperature is 98.8 F (37.1 C). Her blood pressure is 135/87 and her pulse is 99. Her respiration is 12 and oxygen saturation is 100%.   Wt Readings from Last 3 Encounters:  12/13/14 194 lb 6.4 oz (88.179 kg)  12/06/14 195 lb 1.6 oz (88.497 kg)  11/29/14 193 lb (87.544 kg)   Breast:   Moist upon dry desquamation, at right inframammary fold (Grade 2). Dry desquamation of right axilla.  Rt Breast diffusely hyperpigmented   Still has palpable seroma which is similar in size. No lymphedema in her arms.  Impression:  She is tolerating radiotherapy well.  Plan:  Continue radiotherapy as planned.  Triple antibiotic ointment continue to use at IM fold. Follow up in 1 month.     ________________   [name], M.D.

## 2014-12-14 ENCOUNTER — Ambulatory Visit
Admission: RE | Admit: 2014-12-14 | Discharge: 2014-12-14 | Disposition: A | Discharge status: Home / Self Care | Payer: Medicaid Other | Source: Ambulatory Visit | Attending: Radiation Oncology | Admitting: Radiation Oncology

## 2014-12-14 ENCOUNTER — Encounter: Payer: Self-pay | Admitting: Radiation Oncology

## 2014-12-22 ENCOUNTER — Telehealth: Payer: Self-pay | Admitting: *Deleted

## 2014-12-22 NOTE — Telephone Encounter (Signed)
On 12-22-14 fax medical records to disability determination service, it was consult note, end of tx note, sim & planning note, follow up note.

## 2014-12-23 ENCOUNTER — Telehealth: Payer: Self-pay | Admitting: Hematology and Oncology

## 2014-12-23 ENCOUNTER — Encounter: Payer: Self-pay | Admitting: *Deleted

## 2014-12-23 ENCOUNTER — Encounter: Payer: Self-pay | Admitting: Hematology and Oncology

## 2014-12-23 ENCOUNTER — Ambulatory Visit (HOSPITAL_BASED_OUTPATIENT_CLINIC_OR_DEPARTMENT_OTHER): Payer: Medicaid Other | Admitting: Hematology and Oncology

## 2014-12-23 VITALS — BP 146/82 | HR 68 | Temp 98.3°F | Resp 18 | Ht 67.0 in | Wt 193.7 lb

## 2014-12-23 DIAGNOSIS — C50211 Malignant neoplasm of upper-inner quadrant of right female breast: Secondary | ICD-10-CM | POA: Diagnosis present

## 2014-12-23 DIAGNOSIS — Z006 Encounter for examination for normal comparison and control in clinical research program: Secondary | ICD-10-CM | POA: Diagnosis not present

## 2014-12-23 MED ORDER — TAMOXIFEN CITRATE 20 MG PO TABS: 20.0000 mg | ORAL_TABLET | Freq: Every day | ORAL | Status: DC

## 2014-12-23 NOTE — Assessment & Plan Note (Signed)
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  Treatment summary: Patient completed neoadjuvant dose dense Adriamycin and Cytoxan and weekly Taxol X 12 started 02/09/2014 and completed 07/01/14 Right double lumpectomy 2:00: IDC grade 3, 3.2 cm, high-grade DCIS, LV I present, PNI present,; 12:00: IDC grade 3; 0.8 cm, high-grade DCIS, LVI, 4/4 lymph nodes positive with ECE, ER 90%, PR 40%, HER-2 negative margin positive T2 N2 M0 stage IIIa Patient is on [employer] clinical trial and she was randomized intraoperatively for NO lymph node dissection. Current treatment: Adjuvant radiation  Treatment plan: At the conclusion of radiation therapy patient will start antiestrogen therapy. We plan to do ovarian suppression with Zoladex plus anastrozole We will start this treatment 2 weeks after completion of radiation therapy. I discussed the risks and benefits of antiestrogen therapy and she understands them completely.

## 2014-12-23 NOTE — Progress Notes (Signed)
12/23/2014 Patient in to clinic today for evaluation by medical oncologist, following completion of radiation therapy. Met with patient to discuss study progress to date, explaining that future visits are expected to occur on an every six month basis, though she may be seen more often at the discretion of her provider. Discussed need for continued mammograms with patient and MD, which should be performed annually from baseline (01/12/2014), per protocol. Thanked patient for her participation in this trial. Cindy S. Shaw BSN, RN, CCRP 12/23/2014 1:59 PM 

## 2014-12-23 NOTE — Progress Notes (Signed)
Patient Care Team: Provider Not In System as PCP - Camp Swift III, MD as Consulting Physician (General Surgery) [name], MD as Consulting Physician (Hematology and Oncology) [name], MD as Attending Physician (Radiation Oncology)  DIAGNOSIS: Breast cancer of upper-inner quadrant of right female breast   Staging form: Breast, AJCC 7th Edition     Clinical: Stage IIB (T2, N1, cM0) - Unsigned       Staging comments: Staged at breast conference 01/20/14.      Pathologic stage from 08/25/2014: Stage IIIA (yT2(m), N2a, cM0) - Signed by Enid Cutter, MD on 09/02/2014       Staging comments: Staged on final lumpectomy specimen by Dr. Lyndon Code    SUMMARY OF ONCOLOGIC HISTORY:   Breast cancer of upper-inner quadrant of right female breast   01/12/2014 Mammogram Right breast 2:00 position 1.6 x 1.9 cm and the 12:00 position 0.5 x 0.9 cm distance between the 2 was 4.5 cm, right axillary lymph node enlargement   01/12/2014 Initial Diagnosis All 3 biopsies were IDC with DCIS ER percent PR 7200% Ki-67 85% HER-2 negative: Right axillary lymph node also positive for metastatic cancer ER positive HER-2 negative Ki-67 80% grade 2   01/18/2014 Breast MRI Right breast 12:00 position 2.8 x 2 x 2.2 cm: 2:00 position 1.3 x 0.8 x 0.5 cm, right axilla multiple enlarged lymph nodes 1 cm in size, right retropectoral lymph node 0.6 cm   02/11/2014 -  Neo-Adjuvant Chemotherapy Doxorubicin and Cyclophosphamide X 4 followed by Taxol weekly x12    07/12/2014 Breast MRI Partial response to neoadjuvant chemotherapy right breast mass decreased from 2.8 cm to 2.3 cm, 2 small masses along the medial margin also decreased; significant decrease right breast mass 11 to 12:00 and 13 mm to 8 mm, decrease in right axilla LN   08/23/2014 Surgery Right lumpectomy 2:00: IDC grade 3, 3.2 cm, high-grade DCIS, LV I present, PNI present,; 12:00: IDC grade 30.8 cm, high-grade DCIS, LV I, 4/4 lymph nodes positive with ECE, ER 90%, PR 40%,  HER-2 negative margin positive   11/01/2014 - 12/21/2014 Radiation Therapy Adjuvant XRT    CHIEF COMPLIANT: Follow-up after radiation  INTERVAL HISTORY: ARIYANNA Martinez is a 39 year old lady with above-mentioned history of right breast cancer who underwent new adjuvant chemotherapy followed by lumpectomy and radiation therapy. She is here to discuss starting antiestrogen therapy. She is recovering very well from the effects of radiation to the axilla. There is mild dry skin changes without any skin breakdown.  REVIEW OF SYSTEMS:   Constitutional: Denies fevers, chills or abnormal weight loss Eyes: Denies blurriness of vision Ears, nose, mouth, throat, and face: Denies mucositis or sore throat Respiratory: Denies cough, dyspnea or wheezes Cardiovascular: Denies palpitation, chest discomfort or lower extremity swelling Gastrointestinal:  Denies nausea, heartburn or change in bowel habits Skin: Denies abnormal skin rashes Lymphatics: Denies new lymphadenopathy or easy bruising Neurological:Denies numbness, tingling or new weaknesses Behavioral/Psych: Mood is stable, no new changes  Breast: Right axilla and chest wall: dry desquamation All other systems were reviewed with the patient and are negative.  I have reviewed the past medical history, past surgical history, social history and family history with the patient and they are unchanged from previous note.  ALLERGIES:  is allergic to lisinopril.  MEDICATIONS:  Current Outpatient Prescriptions  Medication Sig Dispense Refill  . atenolol (TENORMIN) 50 MG tablet Take 50 mg by mouth daily.    Marland Kitchen ENSURE (ENSURE) Take 237 mLs by mouth 2 (two)  times daily between meals.     . ferrous sulfate 325 (65 FE) MG tablet Take 325 mg by mouth daily with breakfast.    . hyaluronate sodium (RADIAPLEXRX) GEL Apply 1 application topically 2 (two) times daily.    Marland Kitchen LORazepam (ATIVAN) 0.5 MG tablet TAKE 1 TABLET EVERY 6 HOURS AS NEEDED ANXIETY 30 tablet 0   . non-metallic deodorant (ALRA) MISC Apply 1 application topically daily as needed.    Marland Kitchen oxyCODONE-acetaminophen (ROXICET) 5-325 MG per tablet Take 1-2 tablets by mouth every 4 (four) hours as needed. 50 tablet 0  . tamoxifen (NOLVADEX) 20 MG tablet Take 1 tablet (20 mg total) by mouth daily. 90 tablet 3   No current facility-administered medications for this visit.    PHYSICAL EXAMINATION: ECOG PERFORMANCE STATUS: 1 - Symptomatic but completely ambulatory  Filed Vitals:   12/23/14 1044  BP: 146/82  Pulse: 68  Temp: 98.3 F (36.8 C)  Resp: 18   Filed Weights   12/23/14 1044  Weight: 193 lb 11.2 oz (87.862 kg)    GENERAL:alert, no distress and comfortable SKIN: skin color, texture, turgor are normal, no rashes or significant lesions EYES: normal, Conjunctiva are pink and non-injected, sclera clear OROPHARYNX:no exudate, no erythema and lips, buccal mucosa, and tongue normal  NECK: supple, thyroid normal size, non-tender, without nodularity LYMPH:  no palpable lymphadenopathy in the cervical, axillary or inguinal LUNGS: clear to auscultation and percussion with normal breathing effort HEART: regular rate & rhythm and no murmurs and no lower extremity edema ABDOMEN:abdomen soft, non-tender and normal bowel sounds Musculoskeletal:no cyanosis of digits and no clubbing  NEURO: alert & oriented x 3 with fluent speech, no focal motor/sensory deficits BREAST: Dry desquamation of the right axilla and chest wall, no skin breakdown (exam performed in the presence of a chaperone)  LABORATORY DATA:  I have reviewed the data as listed   Chemistry      Component Value Date/Time   NA 140 08/20/2014 0843   NA 142 07/01/2014 0821   K 4.2 08/20/2014 0843   K 3.9 07/01/2014 0821   CL 107 08/20/2014 0843   CO2 27 08/20/2014 0843   CO2 24 07/01/2014 0821   BUN 11 08/20/2014 0843   BUN 12.3 07/01/2014 0821   CREATININE 0.66 08/20/2014 0843   CREATININE 0.8 07/01/2014 0821       Component Value Date/Time   CALCIUM 9.5 08/20/2014 0843   CALCIUM 9.2 07/01/2014 0821   ALKPHOS 63 07/01/2014 0821   AST 19 07/01/2014 0821   ALT 35 07/01/2014 0821   BILITOT 0.27 07/01/2014 0821       Lab Results  Component Value Date   WBC 4.7 08/20/2014   HGB 12.0 08/20/2014   HCT 37.9 08/20/2014   MCV 87.1 08/20/2014   PLT 217 08/20/2014   NEUTROABS 3.3 07/01/2014    ASSESSMENT & PLAN:  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  Treatment summary: Patient completed neoadjuvant dose dense Adriamycin and Cytoxan and weekly Taxol X 12 started 02/09/2014 and completed 07/01/14 Right double lumpectomy 2:00: IDC grade 3, 3.2 cm, high-grade DCIS, LV I present, PNI present,; 12:00: IDC grade 3; 0.8 cm, high-grade DCIS, LVI, 4/4 lymph nodes positive with ECE, ER 90%, PR 40%, HER-2 negative margin positive T2 N2 M0 stage IIIa Patient is on [employer] clinical trial and she  was randomized intraoperatively for NO lymph node dissection. Current treatment: Adjuvant radiation  Treatment plan: Tamoxifen 20 mg daily to start 12/29/2014 Tamoxifen counseling:We discussed the risks and benefits of tamoxifen. These include but not limited to insomnia, hot flashes, mood changes, vaginal dryness, and weight gain. Although rare, serious side effects including endometrial cancer, risk of blood clots were also discussed. We strongly believe that the benefits far outweigh the risks. Patient understands these risks and consented to starting treatment. Planned treatment duration is 10 years.  Patient reported that she lost her job and recently lost her house. She is still struggling to figure out where to live. She has a 94-year-old daughter. I discussed the risks and benefits of antiestrogen therapy and she  understands them completely. Return to clinic in 1 month after starting antiestrogen therapy.  No orders of the defined types were placed in this encounter.   The patient has a good understanding of the overall plan. she agrees with it. she will call with any problems that may develop before the next visit here.   Rulon Eisenmenger, MD

## 2014-12-23 NOTE — Telephone Encounter (Signed)
Gave avs & appointments for October

## 2014-12-27 NOTE — Progress Notes (Addendum)
  Radiation Oncology         (336) [phone] ________________________________  Name: JULIONA VALES MRN: [id number]  Date: 12/14/2014  DOB: May 13, 1975  End of Treatment Note  DIAGNOSIS:    ICD-9-CM ICD-10-CM   1. Breast cancer of upper-inner quadrant of right female breast 174.2 C50.211       Stage III yp T2N2a clinical M0 right breast invasive ductal carcinoma, multifocal, with dcis, grade 3, ER+ PR+ her2 negative.  Indication for treatment:  curative       Radiation treatment dates:   11/03/2014-12/14/2014  Site/dose:   1) Right Breast, supraclavicular nodes, axillary nodes, and internal mammary nodes / 50 Gy in 25 fractions 2) Right Breast Boost / 10 Gy in 5 fractions  Beams/energy:  1) IMRT-VMAT   / 10  and 6MV 2) 3 field photons /  15 and 6MV  Narrative: The patient tolerated radiation treatment relatively well.    Plan: The patient has completed radiation treatment on clinical trial ([employer] [id number]).  The patient will return to radiation oncology clinic for routine followup in one month. I advised them to call or return sooner if they have any questions or concerns related to their recovery or treatment.  -----------------------------------  [name], MD

## 2015-01-04 NOTE — Progress Notes (Signed)
Photon Boost Complex Emergency planning/management officer Note  Diagnosis: Breast Cancer  The patient's CT images from her free-breathing simulation were reviewed to plan her boost treatment to her right breast  lumpectomy cavity.  The boost to the lumpectomy cavity will be delivered with 3 photon fields using MLCs for custom blocks again heart and lungs, with 15 and 6 MV photon energy.  This constitutes 3 complex treatment devices. Isodose plan was reviewed and approved. 10 Gy in 5 fractions prescribed.  -----------------------------------  [name], MD

## 2015-01-04 NOTE — Progress Notes (Signed)
IMRT will be used for the patient's whole breast and regional node initial treatment plan due to the fact that 3D conformal techniques failed to meet the criteria of  clinical trial ([facility] [id number]) which patient is enrolled on. (Cannot spare normal tissues, including lung tissue, with 3D conformal RT).  -----------------------------------  [name], MD

## 2015-01-19 ENCOUNTER — Ambulatory Visit
Admission: RE | Admit: 2015-01-19 | Discharge: 2015-01-19 | Disposition: A | Discharge status: Home / Self Care | Payer: Medicaid Other | Source: Ambulatory Visit | Attending: Radiation Oncology | Admitting: Radiation Oncology

## 2015-01-19 VITALS — BP 149/87 | HR 75 | Temp 97.4°F | Ht 67.0 in | Wt 192.5 lb

## 2015-01-19 DIAGNOSIS — C50211 Malignant neoplasm of upper-inner quadrant of right female breast: Secondary | ICD-10-CM

## 2015-01-19 NOTE — Progress Notes (Signed)
Ms. Eusebia Grulke presents for follow up of right breast cancer. She reports some fatigue and has some darkening to her right breast, but does not complain of any pain.  BP 149/87 mmHg  Pulse 75  Temp(Src) 97.4 F (36.3 C)  Ht 5\' 7"  (1.702 m)  Wt 192 lb 8 oz (87.317 kg)  BMI 30.14 kg/m2  Wt Readings from Last 3 Encounters:  01/19/15 192 lb 8 oz (87.317 kg)  12/23/14 193 lb 11.2 oz (87.862 kg)  12/13/14 194 lb 6.4 oz (88.179 kg)

## 2015-01-19 NOTE — Progress Notes (Signed)
Radiation Oncology         (336) [phone] ________________________________  Name: Debbie Martinez MRN: [id number]  Date: 01/19/2015  DOB: 1975/09/09  Follow-Up Visit Note  Outpatient  CC: PROVIDER NOT IN SYSTEM  [name], MD  Diagnosis and Prior Radiotherapy:    ICD-9-CM ICD-10-CM   1. Breast cancer of upper-inner quadrant of right female breast 174.2 C50.211     Stage III yp T2N2a clinical M0 right breast invasive ductal carcinoma, multifocal, with dcis, grade 3, ER+ PR+ her2 negative.  Indication for treatment:  curative       Radiation treatment dates:   11/03/2014-12/14/2014  Site/dose:   1) Right Breast, supraclavicular nodes, axillary nodes, and internal mammary nodes / 50 Gy in 25 fractions 2) Right Breast Boost / 10 Gy in 5 fractions  Narrative:    Ms. Debbie Martinez presents for follow up of right breast cancer. She reports some fatigue and has some darkening to her right breast, but does not complain of any pain other than occasional shooting pains in the right breast.  She is taking Tamoxifen.                               ALLERGIES:  is allergic to lisinopril.  Meds: Current Outpatient Prescriptions  Medication Sig Dispense Refill  . atenolol (TENORMIN) 50 MG tablet Take 50 mg by mouth daily.    Marland Kitchen ENSURE (ENSURE) Take 237 mLs by mouth 2 (two) times daily between meals.     . ferrous sulfate 325 (65 FE) MG tablet Take 325 mg by mouth daily with breakfast.    . LORazepam (ATIVAN) 0.5 MG tablet TAKE 1 TABLET EVERY 6 HOURS AS NEEDED ANXIETY 30 tablet 0  . tamoxifen (NOLVADEX) 20 MG tablet Take 1 tablet (20 mg total) by mouth daily. 90 tablet 3  . hyaluronate sodium (RADIAPLEXRX) GEL Apply 1 application topically 2 (two) times daily.    . non-metallic deodorant Jethro Poling) MISC Apply 1 application topically daily as needed.    Marland Kitchen oxyCODONE-acetaminophen (ROXICET) 5-325 MG per tablet Take 1-2 tablets by mouth every 4 (four) hours as needed. (Patient not taking:  Reported on 01/19/2015) 50 tablet 0   No current facility-administered medications for this encounter.    Physical Findings: The patient is in no acute distress. Patient is alert and oriented.  height is '5\' 7"'  (1.702 m) and weight is 192 lb 8 oz (87.317 kg). Her temperature is 97.4 F (36.3 C). Her blood pressure is 149/87 and her pulse is 75. .    Breasts: Seroma (Grade I), firm, throughout majority of right breast, UIQ.   No other masses in bilateral breasts. Axillae: No palpable masses in axillary regions bilaterally Ext: no edema in arms bilaterally Heart: RRR Chest: CTAB Abd: soft, nontender Skin: Resolving hyperpigmentation over right breast, skin intact.   Lab Findings: Lab Results  Component Value Date   WBC 4.7 08/20/2014   HGB 12.0 08/20/2014   HCT 37.9 08/20/2014   MCV 87.1 08/20/2014   PLT 217 08/20/2014    Radiographic Findings: No results found.  Impression/Plan: Follow-up in February.  She is NED.   Grade I right breast seroma persists.  No evidence of wound infection, nor dermatitis, nor lymphedema today.  I will notify research nurses that while she is due for a yearly mammogram, but may still be too soon after treatment to accomplish this without too much discomfort. I would recommend mammography  after 1 more month of healing. Will order this.  _____________________________________   [name], MD

## 2015-01-20 ENCOUNTER — Telehealth: Payer: Self-pay | Admitting: *Deleted

## 2015-01-20 NOTE — Telephone Encounter (Signed)
Called patient to inform of mammogram on 02-14-15- arrival time - 11 am @ [facility], spoke with patient and she is aware of this test.

## 2015-01-21 ENCOUNTER — Telehealth: Payer: Self-pay | Admitting: *Deleted

## 2015-01-21 NOTE — Telephone Encounter (Signed)
Patient called and stated,"I've been itching for several weeks now, and today I'm starting to break out in blisters all over my body. Can Dr. Lindi Adie see me today?" Informed patient that Dr. Lindi Adie has a full schedule today. Instructed patient to go to her PCP, Urgent Care or an ER. Patient stated,"I don't have a primary doctor." Told her to go to an Urgent care or the ER. Instructed her to call us on Monday to see how she is doing. Patient verbalized understanding.

## 2015-02-02 NOTE — Assessment & Plan Note (Signed)
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  Treatment summary: Patient completed neoadjuvant dose dense Adriamycin and Cytoxan and weekly Taxol X 12 started 02/09/2014 and completed 07/01/14 Right double lumpectomy 2:00: IDC grade 3, 3.2 cm, high-grade DCIS, LV I present, PNI present,; 12:00: IDC grade 3; 0.8 cm, high-grade DCIS, LVI, 4/4 lymph nodes positive with ECE, ER 90%, PR 40%, HER-2 negative margin positive T2 N2 M0 stage IIIa Patient is on [employer] clinical trial and she was randomized intraoperatively for NO lymph node dissection. Current treatment: Adjuvant radiation  Treatment plan: Tamoxifen 20 mg daily started 12/29/2014 Tamoxifen Toxicities: Financial Issues: Patient lost her house and job. RTC in 6 months for follow up.

## 2015-02-03 ENCOUNTER — Encounter: Payer: Self-pay | Admitting: *Deleted

## 2015-02-03 ENCOUNTER — Encounter: Payer: Self-pay | Admitting: Hematology and Oncology

## 2015-02-03 ENCOUNTER — Telehealth: Payer: Self-pay | Admitting: Hematology and Oncology

## 2015-02-03 ENCOUNTER — Ambulatory Visit (HOSPITAL_BASED_OUTPATIENT_CLINIC_OR_DEPARTMENT_OTHER): Payer: Medicaid Other | Admitting: Hematology and Oncology

## 2015-02-03 VITALS — BP 151/90 | HR 154 | Temp 98.2°F | Resp 18 | Ht 67.0 in | Wt 189.5 lb

## 2015-02-03 DIAGNOSIS — Z170 Estrogen receptor positive status [ER+]: Secondary | ICD-10-CM

## 2015-02-03 DIAGNOSIS — Z9221 Personal history of antineoplastic chemotherapy: Secondary | ICD-10-CM

## 2015-02-03 DIAGNOSIS — Z923 Personal history of irradiation: Secondary | ICD-10-CM | POA: Diagnosis not present

## 2015-02-03 DIAGNOSIS — Z79810 Long term (current) use of selective estrogen receptor modulators (SERMs): Secondary | ICD-10-CM

## 2015-02-03 DIAGNOSIS — C773 Secondary and unspecified malignant neoplasm of axilla and upper limb lymph nodes: Secondary | ICD-10-CM | POA: Diagnosis not present

## 2015-02-03 DIAGNOSIS — C50211 Malignant neoplasm of upper-inner quadrant of right female breast: Secondary | ICD-10-CM | POA: Diagnosis not present

## 2015-02-03 NOTE — Progress Notes (Signed)
02/03/2015 Patient in to clinic today for interim follow-up visit with Medical Oncology. Confirmed initiation of tamoxifen, on 12/29/2014. Patient is aware that adjuvant endocrine therapy will continue for at least five years. Patient states that she has a follow-up appointment with Dr. [name] in November. Confirmed appointment for mammogram on 02/14/2015. Patient is aware that follow-up visits will occur initially every six months, beginning six months from the end of radiation therapy. Patient is in agreement with this plan. Cindy S. Brigitte Pulse BSN, RN, [place] 02/03/2015 2:31 PM

## 2015-02-03 NOTE — Telephone Encounter (Signed)
Appointments made and avs printed for pateint °

## 2015-02-03 NOTE — Progress Notes (Signed)
Patient Care Team: Provider Not In System as PCP - Kinta III, MD as Consulting Physician (General Surgery) [name], MD as Consulting Physician (Hematology and Oncology) [name], MD as Attending Physician (Radiation Oncology)  DIAGNOSIS: Breast cancer of upper-inner quadrant of right female breast [facility])   Staging form: Breast, AJCC 7th Edition     Clinical: Stage IIB (T2, N1, cM0) - Unsigned       Staging comments: Staged at breast conference 01/20/14.      Pathologic stage from 08/25/2014: Stage IIIA (yT2(m), N2a, cM0) - Signed by Enid Cutter, MD on 09/02/2014       Staging comments: Staged on final lumpectomy specimen by Dr. Lyndon Code  SUMMARY OF ONCOLOGIC HISTORY:   Breast cancer of upper-inner quadrant of right female breast ([place])   01/12/2014 Mammogram Right breast 2:00 position 1.6 x 1.9 cm and the 12:00 position 0.5 x 0.9 cm distance between the 2 was 4.5 cm, right axillary lymph node enlargement   01/12/2014 Initial Diagnosis All 3 biopsies were IDC with DCIS ER percent PR 7200% Ki-67 85% HER-2 negative: Right axillary lymph node also positive for metastatic cancer ER positive HER-2 negative Ki-67 80% grade 2   01/18/2014 Breast MRI Right breast 12:00 position 2.8 x 2 x 2.2 cm: 2:00 position 1.3 x 0.8 x 0.5 cm, right axilla multiple enlarged lymph nodes 1 cm in size, right retropectoral lymph node 0.6 cm   02/11/2014 -  Neo-Adjuvant Chemotherapy Doxorubicin and Cyclophosphamide X 4 followed by Taxol weekly x12    07/12/2014 Breast MRI Partial response to neoadjuvant chemotherapy right breast mass decreased from 2.8 cm to 2.3 cm, 2 small masses along the medial margin also decreased; significant decrease right breast mass 11 to 12:00 and 13 mm to 8 mm, decrease in right axilla LN   08/23/2014 Surgery Right lumpectomy 2:00: IDC grade 3, 3.2 cm, high-grade DCIS, LV I present, PNI present,; 12:00: IDC grade 30.8 cm, high-grade DCIS, LV I, 4/4 lymph nodes positive with ECE, ER 90%, PR  40%, HER-2 negative margin positive   11/01/2014 - 12/21/2014 Radiation Therapy Adjuvant XRT   12/29/2014 -  Anti-estrogen oral therapy Tamoxifen 20 mg daily    CHIEF COMPLIANT: follow-up on tamoxifen  INTERVAL HISTORY: Debbie Martinez is a 39 year old with above-mentioned history of right breast cancer currently on adjuvant therapy with tamoxifen. She appears to be tolerating it fairly well. She does have hot flashes accompanied by sweats. But it appears that the hot flashes are getting better over time. She no longer has these at nighttime. She recently found a job at [employer] and she can work from home. She is so excited about it.  REVIEW OF SYSTEMS:   Constitutional: Denies fevers, chills or abnormal weight loss Eyes: Denies blurriness of vision Ears, nose, mouth, throat, and face: Denies mucositis or sore throat Respiratory: Denies cough, dyspnea or wheezes Cardiovascular: Denies palpitation, chest discomfort or lower extremity swelling Gastrointestinal:  Denies nausea, heartburn or change in bowel habits Skin: Denies abnormal skin rashes Lymphatics: Denies new lymphadenopathy or easy bruising Neurological:Denies numbness, tingling or new weaknesses Behavioral/Psych: Mood is stable, no new changes  Breast:  denies any pain or lumps or nodules in either breasts All other systems were reviewed with the patient and are negative.  I have reviewed the past medical history, past surgical history, social history and family history with the patient and they are unchanged from previous note.  ALLERGIES:  is allergic to lisinopril.  MEDICATIONS:  Current  Outpatient Prescriptions  Medication Sig Dispense Refill  . atenolol (TENORMIN) 50 MG tablet Take 50 mg by mouth daily.    Marland Kitchen ENSURE (ENSURE) Take 237 mLs by mouth 2 (two) times daily between meals.     . ferrous sulfate 325 (65 FE) MG tablet Take 325 mg by mouth daily with breakfast.    . hyaluronate sodium (RADIAPLEXRX) GEL Apply 1  application topically 2 (two) times daily.    Marland Kitchen LORazepam (ATIVAN) 0.5 MG tablet TAKE 1 TABLET EVERY 6 HOURS AS NEEDED ANXIETY 30 tablet 0  . non-metallic deodorant (ALRA) MISC Apply 1 application topically daily as needed.    Marland Kitchen oxyCODONE-acetaminophen (ROXICET) 5-325 MG per tablet Take 1-2 tablets by mouth every 4 (four) hours as needed. (Patient not taking: Reported on 01/19/2015) 50 tablet 0  . tamoxifen (NOLVADEX) 20 MG tablet Take 1 tablet (20 mg total) by mouth daily. 90 tablet 3   No current facility-administered medications for this visit.    PHYSICAL EXAMINATION: ECOG PERFORMANCE STATUS: 1 - Symptomatic but completely ambulatory  Filed Vitals:   02/03/15 1047  BP: 151/90  Pulse: 154  Temp: 98.2 F (36.8 C)  Resp: 18   Filed Weights   02/03/15 1047  Weight: 189 lb 8 oz (85.957 kg)    GENERAL:alert, no distress and comfortable SKIN: skin color, texture, turgor are normal, no rashes or significant lesions EYES: normal, Conjunctiva are pink and non-injected, sclera clear OROPHARYNX:no exudate, no erythema and lips, buccal mucosa, and tongue normal  NECK: supple, thyroid normal size, non-tender, without nodularity LYMPH:  no palpable lymphadenopathy in the cervical, axillary or inguinal LUNGS: clear to auscultation and percussion with normal breathing effort HEART: regular rate & rhythm and no murmurs and no lower extremity edema ABDOMEN:abdomen soft, non-tender and normal bowel sounds Musculoskeletal:no cyanosis of digits and no clubbing  NEURO: alert & oriented x 3 with fluent speech, no focal motor/sensory deficits  LABORATORY DATA:  I have reviewed the data as listed   Chemistry      Component Value Date/Time   NA 140 08/20/2014 0843   NA 142 07/01/2014 0821   K 4.2 08/20/2014 0843   K 3.9 07/01/2014 0821   CL 107 08/20/2014 0843   CO2 27 08/20/2014 0843   CO2 24 07/01/2014 0821   BUN 11 08/20/2014 0843   BUN 12.3 07/01/2014 0821   CREATININE 0.66 08/20/2014  0843   CREATININE 0.8 07/01/2014 0821      Component Value Date/Time   CALCIUM 9.5 08/20/2014 0843   CALCIUM 9.2 07/01/2014 0821   ALKPHOS 63 07/01/2014 0821   AST 19 07/01/2014 0821   ALT 35 07/01/2014 0821   BILITOT 0.27 07/01/2014 0821       Lab Results  Component Value Date   WBC 4.7 08/20/2014   HGB 12.0 08/20/2014   HCT 37.9 08/20/2014   MCV 87.1 08/20/2014   PLT 217 08/20/2014   NEUTROABS 3.3 07/01/2014    ASSESSMENT & PLAN:  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  Treatment summary: Patient completed neoadjuvant dose dense Adriamycin and Cytoxan and weekly Taxol X 12 started 02/09/2014 and completed 07/01/14 Right double lumpectomy 2:00: IDC grade 3, 3.2 cm, high-grade DCIS, LV I present, PNI present,; 12:00: IDC grade 3; 0.8 cm, high-grade DCIS, LVI, 4/4 lymph nodes positive with ECE, ER 90%, PR  40%, HER-2 negative margin positive T2 N2 M0 stage IIIa Patient is on [employer] clinical trial and she was randomized intraoperatively for NO lymph node dissection. Current treatment: Adjuvant radiation  Treatment plan: Tamoxifen 20 mg daily started 12/29/2014 Tamoxifen Toxicities: 1. Hot flashes with sweats: Greatly better over time  Financial Issues: Patient recently found a new job at [employer].  RTC in 6 months for follow up.  No orders of the defined types were placed in this encounter.   The patient has a good understanding of the overall plan. she agrees with it. she will call with any problems that may develop before the next visit here.   Rulon Eisenmenger, MD 02/03/2015

## 2015-02-04 ENCOUNTER — Encounter: Payer: Self-pay | Admitting: *Deleted

## 2015-02-04 DIAGNOSIS — C50211 Malignant neoplasm of upper-inner quadrant of right female breast: Secondary | ICD-10-CM

## 2015-02-04 NOTE — Progress Notes (Signed)
Met with pt during f/u with Dr. Lindi Adie. Denies questions or needs at this time. Discussed survivorship program and referral. Pt request to have survivorship care plan mailed to her since she has moved to [place]. Encourage pt to call with concerns. Received verbal understanding.

## 2015-02-14 ENCOUNTER — Ambulatory Visit
Admission: RE | Admit: 2015-02-14 | Discharge: 2015-02-14 | Disposition: A | Discharge status: Home / Self Care | Payer: Medicaid Other | Source: Ambulatory Visit | Attending: Radiation Oncology | Admitting: Radiation Oncology

## 2015-02-14 DIAGNOSIS — C50211 Malignant neoplasm of upper-inner quadrant of right female breast: Secondary | ICD-10-CM

## 2015-02-25 ENCOUNTER — Encounter: Payer: Self-pay | Admitting: Genetic Counselor

## 2015-02-25 DIAGNOSIS — Z1379 Encounter for other screening for genetic and chromosomal anomalies: Secondary | ICD-10-CM | POA: Insufficient documentation

## 2015-04-08 ENCOUNTER — Encounter: Payer: Self-pay | Admitting: Nurse Practitioner

## 2015-04-08 DIAGNOSIS — C50211 Malignant neoplasm of upper-inner quadrant of right female breast: Secondary | ICD-10-CM

## 2015-04-08 NOTE — Progress Notes (Signed)
The Survivorship Care Plan was mailed to Debbie Martinez as she reported not being able to come in to the Survivorship Clinic for an in-person visit at this time. A letter was mailed to her outlining the purpose of the content of the care plan, as well as encouraging her to reach out to me with any questions or concerns.  My business card was included in the correspondence to the patient as well.    I will not be placing any follow-up appointments to the Survivorship Clinic for Debbie Martinez, but I am happy to see her at any time in the future for any survivorship concerns that may arise. Thank you for allowing me to participate in her care!  Kenn File, [place] [phone]

## 2015-05-27 ENCOUNTER — Encounter: Payer: Self-pay | Admitting: Radiation Oncology

## 2015-05-27 ENCOUNTER — Ambulatory Visit
Admission: RE | Admit: 2015-05-27 | Discharge: 2015-05-27 | Disposition: A | Discharge status: Home / Self Care | Payer: Medicaid Other | Source: Ambulatory Visit | Attending: Radiation Oncology | Admitting: Radiation Oncology

## 2015-05-27 VITALS — BP 133/90 | HR 92 | Temp 98.3°F | Ht 67.0 in | Wt 188.4 lb

## 2015-05-27 DIAGNOSIS — C50211 Malignant neoplasm of upper-inner quadrant of right female breast: Secondary | ICD-10-CM

## 2015-05-27 NOTE — Progress Notes (Signed)
Debbie Martinez presents for follow up of radiation completed 12/14/14 to her Right Breast. She reports occasional fatigue, but normally feels well. She reports some pain in her Right axilla that goes down the side of her breast. She rates this a 5/10, and will occasionally take ibuprofen for this. She is unable to sleep on that side of her body at night. The skin over her Right Breast is improved greatly, and she is still using the radiaplex cream every other day.   BP 133/90 mmHg  Pulse 92  Temp(Src) 98.3 F (36.8 C)  Ht 5\' 7"  (1.702 m)  Wt 188 lb 6.4 oz (85.458 kg)  BMI 29.50 kg/m2

## 2015-05-27 NOTE — Progress Notes (Signed)
Radiation Oncology         [phone] ________________________________  Name: Debbie Martinez MRN: [id number]  Date: 05/27/2015  DOB: Sep 01, 1975  Follow-Up Visit Note  Outpatient  CC: PROVIDER NOT IN SYSTEM  [name], MD  Diagnosis and Prior Radiotherapy:    ICD-9-CM ICD-10-CM   1. Breast cancer of upper-inner quadrant of right female breast ([place]) 174.2 C50.211     Stage III yp T2N2a clinical M0 right breast invasive ductal carcinoma, multifocal, with dcis, grade 3, ER+ PR+ her2 negative.  Indication for treatment:  curative       Radiation treatment dates:   11/03/2014-12/14/2014  Site/dose:   1) Right Breast, supraclavicular nodes, axillary nodes, and internal mammary nodes / 50 Gy in 25 fractions 2) Right Breast Boost / 10 Gy in 5 fractions  Narrative:    Ms. Debbie Martinez presents for follow up of right breast cancer. She reports occasional fatigue, but normally feels well. She reports some pain in her right axilla that goes down the side of her breast. She rates this a 5/10, and will occasionally take ibuprofen for this. She is unable to sleep on that side of her body at night. The skin over her right breast has improved greatly and she is still using the radiaplex cream every other day. BIRADS2 Mammogram in Oct 2016  ALLERGIES:  is allergic to lisinopril.  Meds: Current Outpatient Prescriptions  Medication Sig Dispense Refill  . atenolol (TENORMIN) 50 MG tablet Take 50 mg by mouth daily.    Marland Kitchen ENSURE (ENSURE) Take 237 mLs by mouth 2 (two) times daily between meals.     . hyaluronate sodium (RADIAPLEXRX) GEL Apply 1 application topically 2 (two) times daily.    Marland Kitchen LORazepam (ATIVAN) 0.5 MG tablet TAKE 1 TABLET EVERY 6 HOURS AS NEEDED ANXIETY 30 tablet 0  . tamoxifen (NOLVADEX) 20 MG tablet Take 1 tablet (20 mg total) by mouth daily. 90 tablet 3  . ferrous sulfate 325 (65 FE) MG tablet Take 325 mg by mouth daily with breakfast. Reported on 05/27/2015    .  oxyCODONE-acetaminophen (ROXICET) 5-325 MG per tablet Take 1-2 tablets by mouth every 4 (four) hours as needed. (Patient not taking: Reported on 01/19/2015) 50 tablet 0   No current facility-administered medications for this encounter.    Physical Findings: The patient is in no acute distress. Patient is alert and oriented.  height is 5' 7" (1.702 m) and weight is 188 lb 6.4 oz (85.458 kg). Her temperature is 98.3 F (36.8 C). Her blood pressure is 133/90 and her pulse is 92. . General: Alert and oriented, in no acute distress Neck: Neck is supple, no palpable cervical or supraclavicular lymphadenopathy.   Heart: Regular in rate and rhythm with no murmurs Extremities: No cyanosis or edema in upper extremities. Skin: Skin has healed. She still has hyperpigmentation over right breast. Breasts: no palpable masses in breasts or axillae of clinical concern.  Right breast is a little swollen with a large palpable mass consistent with scar tissue where she once had a large seroma.  Lab Findings: Lab Results  Component Value Date   WBC 4.7 08/20/2014   HGB 12.0 08/20/2014   HCT 37.9 08/20/2014   MCV 87.1 08/20/2014   PLT 217 08/20/2014    Radiographic Findings: No results found.  Impression/Plan: NED. She has an appointment with Dr. Lindi Adie 07/07/2015. She is having chronic tenderness on the right breast. I recommended that she goes to a physical therapist, but she  is not interested right now. I will follow up with her in a year and we will discuss Survivorship then.   _____________________________________   [name], MD    This document serves as a record of services personally performed by [name], MD. It was created on her behalf by Lendon Collar, a trained medical scribe. The creation of this record is based on the scribe's personal observations and the provider's statements to them. This document has been checked and approved by the attending provider.

## 2015-07-07 ENCOUNTER — Ambulatory Visit (HOSPITAL_BASED_OUTPATIENT_CLINIC_OR_DEPARTMENT_OTHER): Payer: Medicaid Other | Admitting: Hematology and Oncology

## 2015-07-07 ENCOUNTER — Encounter: Payer: Medicaid Other | Admitting: *Deleted

## 2015-07-07 ENCOUNTER — Encounter: Payer: Self-pay | Admitting: *Deleted

## 2015-07-07 ENCOUNTER — Ambulatory Visit: Payer: Medicaid Other

## 2015-07-07 ENCOUNTER — Encounter: Payer: Self-pay | Admitting: Hematology and Oncology

## 2015-07-07 ENCOUNTER — Telehealth: Payer: Self-pay | Admitting: Hematology and Oncology

## 2015-07-07 VITALS — BP 134/69 | HR 69 | Temp 98.0°F | Resp 18 | Ht 67.0 in | Wt 180.1 lb

## 2015-07-07 DIAGNOSIS — Z923 Personal history of irradiation: Secondary | ICD-10-CM

## 2015-07-07 DIAGNOSIS — C50211 Malignant neoplasm of upper-inner quadrant of right female breast: Secondary | ICD-10-CM

## 2015-07-07 DIAGNOSIS — Z9221 Personal history of antineoplastic chemotherapy: Secondary | ICD-10-CM

## 2015-07-07 DIAGNOSIS — Z006 Encounter for examination for normal comparison and control in clinical research program: Secondary | ICD-10-CM | POA: Diagnosis not present

## 2015-07-07 DIAGNOSIS — Z170 Estrogen receptor positive status [ER+]: Secondary | ICD-10-CM | POA: Diagnosis not present

## 2015-07-07 DIAGNOSIS — Z79810 Long term (current) use of selective estrogen receptor modulators (SERMs): Secondary | ICD-10-CM | POA: Diagnosis not present

## 2015-07-07 NOTE — Progress Notes (Signed)
Patient Care Team: Provider Not In System as PCP - [place] III, MD as Consulting Physician (General Surgery) [name], MD as Consulting Physician (Hematology and Oncology) [name], MD as Attending Physician (Radiation Oncology) Sylvan Cheese, NP as Nurse Practitioner (Hematology and Oncology)  DIAGNOSIS: Breast cancer of upper-inner quadrant of right female breast [facility])   Staging form: Breast, AJCC 7th Edition     Clinical: Stage IIB (T2, N1, cM0) - Unsigned       Staging comments: Staged at breast conference 01/20/14.      Pathologic stage from 08/25/2014: Stage IIIA (yT2(m), N2a, cM0) - Signed by Enid Cutter, MD on 09/02/2014       Staging comments: Staged on final lumpectomy specimen by Dr. Lyndon Code    SUMMARY OF ONCOLOGIC HISTORY:   Breast cancer of upper-inner quadrant of right female breast ([place])   01/12/2014 Mammogram Right breast - two lesions: #1 2:00 position 1.6 x 1.9 cm and #2 12:00 position 0.5 x 0.9 cm. Distance between the 2 was 4.5 cm, right axillary lymph node enlargement   01/12/2014 Initial Biopsy Right breast 3 biopsies: All were IDC with DCIS ER+ PR + Ki-67 85% HER-2 negative: Right axillary lymph node also positive for metastatic cancer ER positive HER-2 negative Ki-67 80% grade 2   01/18/2014 Breast MRI Right breast 12:00 position 2.8 x 2 x 2.2 cm: 2:00 position 1.3 x 0.8 x 0.5 cm, right axilla multiple enlarged lymph nodes 1 cm in size, right retropectoral lymph node 0.6 cm   01/18/2014 Clinical Stage Stage IIB T2 N1   01/25/2014 Procedure Breast/Ovarian ([facility]) reveals no clinically significant variant at ATM, BARD1, BRCA1, BRCA2, BRIP1, CDH1, CHEK2, EPCAM, FANCC, MLH1, MSH2, MSH6, NBN, PALB2, PMS2, PTEN, RAD51C, RAD51D, TP53, and XRCC2.    02/11/2014 -  Neo-Adjuvant Chemotherapy Doxorubicin and cyclophosphamide X 4 followed by Taxol weekly x12    07/12/2014 Breast MRI Partial response to neoadjuvant chemotherapy right breast mass decreased from  2.8 cm to 2.3 cm, 2 small masses along the medial margin also decreased; significant decrease right breast mass 11 to 12:00 and 13 mm to 8 mm, decrease in right axilla LN   08/23/2014 Definitive Surgery Right lumpectomy/[facility] [name]) 2:00: IDC grade 3, 3.2 cm, high-grade DCIS, LV I present, PNI present,; 12:00: IDC grade 30.8 cm, high-grade DCIS, LV I, 4/4 lymph nodes positive with ECE, ER 90%, PR 40%, HER-2 negative margin positive   08/23/2014 Pathologic Stage Stage IIIA: ypT2 ypN2a   09/15/2014 Surgery Reexcision of margins: clear; no evidence of malignancy   11/03/2014 - 12/14/2014 Radiation Therapy Adjuvant XRT Isidore Moos): Right Breast, supraclavicular nodes, axillary nodes, and internal mammary nodes: 50 Gy over 25 fractions; right breast boost: 10 Gy over 5 fractions. Total dose: 60 Gy   12/29/2014 -  Anti-estrogen oral therapy Tamoxifen 20 mg daily   04/08/2015 Survivorship Survivorship care plan completed and mailed to patient in lieu of in person visit at her request    CHIEF COMPLIANT: Follow-up on tamoxifen therapy  INTERVAL HISTORY: Debbie Martinez is a 40 year old with above-mentioned history of right breast cancer who had neoadjuvant chemotherapy followed by lumpectomy and still had significant residual disease. She had adjuvant radiation and is now on tamoxifen therapy. She is tolerating tamoxifen extremely well. She denies any major symptoms other than occasional hot flashes. She continues to have skin sensitivity in the right breast but no erythema.  REVIEW OF SYSTEMS:   Constitutional: Denies fevers, chills or abnormal weight loss Eyes:  Denies blurriness of vision Ears, nose, mouth, throat, and face: Denies mucositis or sore throat Respiratory: Denies cough, dyspnea or wheezes Cardiovascular: Denies palpitation, chest discomfort Gastrointestinal:  Denies nausea, heartburn or change in bowel habits Skin: Denies abnormal skin rashes Lymphatics: Denies new lymphadenopathy or easy  bruising Neurological:Denies numbness, tingling or new weaknesses Behavioral/Psych: Mood is stable, no new changes  Extremities: No lower extremity edema Breast: Right breast sensitivity to touch All other systems were reviewed with the patient and are negative.  I have reviewed the past medical history, past surgical history, social history and family history with the patient and they are unchanged from previous note.  ALLERGIES:  is allergic to lisinopril.  MEDICATIONS:  Current Outpatient Prescriptions  Medication Sig Dispense Refill  . atenolol (TENORMIN) 50 MG tablet Take 50 mg by mouth daily.    Marland Kitchen ENSURE (ENSURE) Take 237 mLs by mouth 2 (two) times daily between meals.     . ferrous sulfate 325 (65 FE) MG tablet Take 325 mg by mouth daily with breakfast. Reported on 05/27/2015    . hyaluronate sodium (RADIAPLEXRX) GEL Apply 1 application topically 2 (two) times daily.    Marland Kitchen LORazepam (ATIVAN) 0.5 MG tablet TAKE 1 TABLET EVERY 6 HOURS AS NEEDED ANXIETY 30 tablet 0  . oxyCODONE-acetaminophen (ROXICET) 5-325 MG per tablet Take 1-2 tablets by mouth every 4 (four) hours as needed. (Patient not taking: Reported on 01/19/2015) 50 tablet 0  . tamoxifen (NOLVADEX) 20 MG tablet Take 1 tablet (20 mg total) by mouth daily. 90 tablet 3   No current facility-administered medications for this visit.    PHYSICAL EXAMINATION: ECOG PERFORMANCE STATUS: 1 - Symptomatic but completely ambulatory  Filed Vitals:   07/07/15 0943  BP: 134/69  Pulse: 69  Temp: 98 F (36.7 C)  Resp: 18   Filed Weights   07/07/15 0943  Weight: 180 lb 1.6 oz (81.693 kg)    GENERAL:alert, no distress and comfortable SKIN: skin color, texture, turgor are normal, no rashes or significant lesions EYES: normal, Conjunctiva are pink and non-injected, sclera clear OROPHARYNX:no exudate, no erythema and lips, buccal mucosa, and tongue normal  NECK: supple, thyroid normal size, non-tender, without nodularity LYMPH:  no  palpable lymphadenopathy in the cervical, axillary or inguinal LUNGS: clear to auscultation and percussion with normal breathing effort HEART: regular rate & rhythm and no murmurs and no lower extremity edema ABDOMEN:abdomen soft, non-tender and normal bowel sounds MUSCULOSKELETAL:no cyanosis of digits and no clubbing  NEURO: alert & oriented x 3 with fluent speech, no focal motor/sensory deficits EXTREMITIES: No lower extremity edema BREAST:Right breast seroma at the lumpectomy and radiation site. (exam performed in the presence of a chaperone)  LABORATORY DATA:  I have reviewed the data as listed   Chemistry      Component Value Date/Time   NA 140 08/20/2014 0843   NA 142 07/01/2014 0821   K 4.2 08/20/2014 0843   K 3.9 07/01/2014 0821   CL 107 08/20/2014 0843   CO2 27 08/20/2014 0843   CO2 24 07/01/2014 0821   BUN 11 08/20/2014 0843   BUN 12.3 07/01/2014 0821   CREATININE 0.66 08/20/2014 0843   CREATININE 0.8 07/01/2014 0821      Component Value Date/Time   CALCIUM 9.5 08/20/2014 0843   CALCIUM 9.2 07/01/2014 0821   ALKPHOS 63 07/01/2014 0821   AST 19 07/01/2014 0821   ALT 35 07/01/2014 0821   BILITOT 0.27 07/01/2014 0821       Lab  Results  Component Value Date   WBC 4.7 08/20/2014   HGB 12.0 08/20/2014   HCT 37.9 08/20/2014   MCV 87.1 08/20/2014   PLT 217 08/20/2014   NEUTROABS 3.3 07/01/2014   ASSESSMENT & PLAN:  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   Treatment summary: Patient completed neoadjuvant dose dense Adriamycin and Cytoxan and weekly Taxol X 12 started 02/09/2014 and completed 07/01/14 Right double lumpectomy 2:00: IDC grade 3, 3.2 cm, high-grade DCIS, LV I present, PNI present,; 12:00: IDC grade 3; 0.8 cm, high-grade DCIS, LVI, 4/4 lymph nodes positive  with ECE, ER 90%, PR 40%, HER-2 negative margin positive T2 N2 M0 stage IIIa Patient is on [employer] clinical trial and she was randomized intraoperatively for NO lymph node dissection.  Current Treatment: Tamoxifen 20 mg daily started 12/29/2014 Tamoxifen Toxicities: 1. Hot flashes with sweats: Greatly better over time Adverse Effects 1. There is no wound infection 2. There is no postoperative hemorrhage 3. There is a large seroma in the right breast lumpectomy site that was also seen on mammogram: Stable 4. There is no lymphedema 5. There is no radiation dermatitis although there is skin sensitivity to touch  Breast Cancer Surveillance: 1. Breast exam 07/07/2015: Right breast seroma, no palpable lymphadenopathy 2. Mammogram 02/14/2015 large seroma measuring 7 cm and skin thickening related to radiation. Postsurgical changes. Breast Density Category B. I recommended that she get 3-D mammograms for surveillance. Discussed the differences between different breast density categories.    Financial Issues: Patient is interviewing for a job at [employer]. I provided her with a letter of recommendation.  RTC in 6 months for follow up.   No orders of the defined types were placed in this encounter.   The patient has a good understanding of the overall plan. she agrees with it. she will call with any problems that may develop before the next visit here.   Rulon Eisenmenger, MD 07/07/2015

## 2015-07-07 NOTE — Assessment & Plan Note (Signed)
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  Treatment summary: Patient completed neoadjuvant dose dense Adriamycin and Cytoxan and weekly Taxol X 12 started 02/09/2014 and completed 07/01/14 Right double lumpectomy 2:00: IDC grade 3, 3.2 cm, high-grade DCIS, LV I present, PNI present,; 12:00: IDC grade 3; 0.8 cm, high-grade DCIS, LVI, 4/4 lymph nodes positive with ECE, ER 90%, PR 40%, HER-2 negative margin positive T2 N2 M0 stage IIIa Patient is on [employer] clinical trial and she was randomized intraoperatively for NO lymph node dissection. Current treatment: Adjuvant radiation  Treatment plan: Tamoxifen 20 mg daily started 12/29/2014 Tamoxifen Toxicities: 1. Hot flashes with sweats: Greatly better over time  Financial Issues: Patient recently found a new job at [employer].  RTC in 6 months for follow up. 

## 2015-07-07 NOTE — Telephone Encounter (Signed)
appt made and avs printed °

## 2015-07-07 NOTE — Progress Notes (Signed)
07/07/2015 Patient in to clinic today for routine medical oncology follow-up visit. Explained to patient that this is the first 79-month follow-up visit scheduled from the end of radiation therapy, and that visits will continue at this frequency for the first two years, then annually, which is within the standard of care for her diagnosis. These visits may be conducted by the medical oncologist, radiation oncologist, surgeon, or other health care provider. Patient voiced understanding. She denies any concerns related to surgery or radiation today. Physical exam and history was performed by Dr. Lindi Adie. Thanked patient for her participation in the trial. Cindy S. Brigitte Pulse BSN, RN, [place] 07/07/2015 11:50 AM

## 2015-07-26 ENCOUNTER — Encounter: Payer: Medicaid Other | Admitting: Nurse Practitioner

## 2015-08-03 ENCOUNTER — Other Ambulatory Visit: Payer: Self-pay | Admitting: Hematology and Oncology

## 2015-08-04 ENCOUNTER — Other Ambulatory Visit: Payer: Self-pay | Admitting: *Deleted

## 2015-08-04 DIAGNOSIS — C50211 Malignant neoplasm of upper-inner quadrant of right female breast: Secondary | ICD-10-CM

## 2015-08-04 MED ORDER — TAMOXIFEN CITRATE 20 MG PO TABS: 20.0000 mg | ORAL_TABLET | Freq: Every day | ORAL | Status: DC

## 2015-09-13 ENCOUNTER — Telehealth: Payer: Self-pay | Admitting: *Deleted

## 2015-09-13 NOTE — Telephone Encounter (Signed)
On 09-13-15 fax medical records to [place] [facility],it was follow up note,sim & planning note, end of tx note.

## 2015-09-15 ENCOUNTER — Telehealth: Payer: Self-pay | Admitting: *Deleted

## 2015-09-15 NOTE — Telephone Encounter (Signed)
Called patient and left VMM for her to stop Tamoxifen for a couple weeks and see if symptoms improve. Advised to call and let us know if improvement.

## 2015-09-15 NOTE — Telephone Encounter (Signed)
"  I've been itching since starting Tamoxifen.  First my entire body then just the vaginal area.   To 3 months ago I went to Urgent Care, was told I had a yeast infection took diflucan but a week later itching resumed.  I alternate OTC Vagisil cream and wipes but it doesn't really stop.  What do I need to stop or ease the itching?  Return number [id number] and I still use the [facility].  I'll be there tomorrow at 9:30 for my flush appointment."

## 2015-09-16 ENCOUNTER — Ambulatory Visit (HOSPITAL_BASED_OUTPATIENT_CLINIC_OR_DEPARTMENT_OTHER): Payer: Medicaid Other

## 2015-09-16 VITALS — BP 141/96 | HR 94 | Temp 98.7°F | Resp 18

## 2015-09-16 DIAGNOSIS — C50411 Malignant neoplasm of upper-outer quadrant of right female breast: Secondary | ICD-10-CM

## 2015-09-16 DIAGNOSIS — Z452 Encounter for adjustment and management of vascular access device: Secondary | ICD-10-CM

## 2015-09-16 DIAGNOSIS — C50211 Malignant neoplasm of upper-inner quadrant of right female breast: Secondary | ICD-10-CM | POA: Diagnosis not present

## 2015-09-16 MED ORDER — HEPARIN SOD (PORK) LOCK FLUSH 100 UNIT/ML IV SOLN
500.0000 [IU] | Freq: Once | INTRAVENOUS | Status: AC
  Administered 2015-09-16: 500 [IU] via INTRAVENOUS
  Filled 2015-09-16: qty 5

## 2015-09-16 MED ORDER — SODIUM CHLORIDE 0.9% FLUSH
10.0000 mL | INTRAVENOUS | Status: DC | PRN
  Administered 2015-09-16: 10 mL via INTRAVENOUS
  Filled 2015-09-16: qty 10

## 2015-09-16 NOTE — Progress Notes (Signed)
BP elevated.  Pt states she has not taken her BP med yet today.  She has it in the car and will take it as soon as she leaves here today.   Instructed pt to make appointments for [facility] Flushes every 4 to 6 weeks.  Offered to send order to scheduling today but pt needs to check her work schedule and will call to make Flush appts later.

## 2015-10-06 ENCOUNTER — Emergency Department (HOSPITAL_COMMUNITY): Payer: Medicaid Other

## 2015-10-06 ENCOUNTER — Emergency Department (HOSPITAL_COMMUNITY)
Admission: EM | Admit: 2015-10-06 | Discharge: 2015-10-06 | Disposition: A | Discharge status: Home / Self Care | Payer: Medicaid Other | Attending: Emergency Medicine | Admitting: Emergency Medicine

## 2015-10-06 ENCOUNTER — Encounter (HOSPITAL_COMMUNITY): Payer: Self-pay | Admitting: Emergency Medicine

## 2015-10-06 DIAGNOSIS — I10 Essential (primary) hypertension: Secondary | ICD-10-CM | POA: Diagnosis not present

## 2015-10-06 DIAGNOSIS — Z853 Personal history of malignant neoplasm of breast: Secondary | ICD-10-CM | POA: Insufficient documentation

## 2015-10-06 DIAGNOSIS — M546 Pain in thoracic spine: Secondary | ICD-10-CM | POA: Insufficient documentation

## 2015-10-06 DIAGNOSIS — M545 Low back pain, unspecified: Secondary | ICD-10-CM

## 2015-10-06 DIAGNOSIS — R52 Pain, unspecified: Secondary | ICD-10-CM

## 2015-10-06 DIAGNOSIS — Z79899 Other long term (current) drug therapy: Secondary | ICD-10-CM | POA: Diagnosis not present

## 2015-10-06 MED ORDER — DIAZEPAM 5 MG PO TABS: 5.0000 mg | ORAL_TABLET | Freq: Two times a day (BID) | ORAL | Status: DC

## 2015-10-06 MED ORDER — OXYCODONE-ACETAMINOPHEN 5-325 MG PO TABS: 1.0000 | ORAL_TABLET | ORAL | Status: DC | PRN

## 2015-10-06 MED ORDER — HYDROMORPHONE HCL 1 MG/ML IJ SOLN
1.0000 mg | Freq: Once | INTRAMUSCULAR | Status: AC
  Administered 2015-10-06: 1 mg via INTRAMUSCULAR
  Filled 2015-10-06: qty 1

## 2015-10-06 MED ORDER — IBUPROFEN 600 MG PO TABS: 600.0000 mg | ORAL_TABLET | Freq: Four times a day (QID) | ORAL | Status: DC | PRN

## 2015-10-06 NOTE — ED Notes (Signed)
Patient transported to X-ray 

## 2015-10-06 NOTE — ED Provider Notes (Signed)
CSN: [id number]     Arrival date & time 10/06/15  1049 History   First MD Initiated Contact with Patient 10/06/15 1053     Chief Complaint  Patient presents with  . Back Pain     (Consider location/radiation/quality/duration/timing/severity/associated sxs/prior Treatment) HPI   Patient is a 40 year old female with past medical history of hypertension, breast cancer and gestational diabetes who presents to the ED with complaint of back pain, onset yesterday morning. Patient reports when she was stepping out of her car yesterday around 10 AM. She began having constant sharp intense pain to her mid and lower back. She notes the pain remained constant throughout the day and worsened overnight. Patient states the pain is worse with movement, ambulating or bending. Patient denies any other recent fall, trauma, injury. Pt denies fever, numbness, tingling, saddle anesthesia, loss of bowel or bladder, abdominal pain, urinary retention, weakness, IVDU or recent spinal manipulation. She notes she has been taking ibuprofen, Aleve and BC powder at home without relief. She also reports using a heat pad without relief. Patient endorses history of breast cancer and notes she was on Tamoxifen which she stopped taking 2 weeks ago. Reports last chemo tx was in April 2016. Denies hx of blood clots.   Past Medical History  Diagnosis Date  . Hypertension   . Breast cancer ([place]) 01/20/14    triple positive  . Anxiety     gets sweaty and short of breath  . Shortness of breath     'anxiety'  . GERD (gastroesophageal reflux disease)     does not take anything  . Gestational diabetes    Past Surgical History  Procedure Laterality Date  . Keloid excision Left     ear  . Portacath placement Left 01/26/2014    Procedure: INSERTION PORT-A-CATH;  Surgeon: Autumn Messing III, MD;  Location: [place];  Service: General;  Laterality: Left;  . Cesarean section      2008  . Breast lumpectomy with needle localization and  axillary sentinel lymph node bx Right 08/23/2014    Procedure: RIGHT BREAST LUMPECTOMY WITH NEEDLE LOCALIZATION(X'S 2)AND RIGHT AXILLARY SENTINEL LYMPH NODE Biopies;  Surgeon: Autumn Messing III, MD;  Location: [place];  Service: General;  Laterality: Right;  . Re-excision of breast cancer,superior margins Right 09/15/2014    Procedure: RE-EXCISION OF RIGHT BREAST CANCER,SUPERIOR AND INFERIOR MARGINS;  Surgeon: Autumn Messing III, MD;  Location: [place];  Service: General;  Laterality: Right;   Family History  Problem Relation Age of Onset  . Heart attack Father 68  . Lupus Maternal Aunt   . Prostate cancer Maternal Grandfather 66  . Dementia Paternal Grandmother   . Leukemia Cousin 30    paternal first cousin   Social History  Substance Use Topics  . Smoking status: Never Smoker   . Smokeless tobacco: None  . Alcohol Use: Yes     Comment: Rarely- maybe  every 6 months   OB History    No data available     Review of Systems  Musculoskeletal: Positive for back pain.  All other systems reviewed and are negative.     Allergies  Lisinopril  Home Medications   Prior to Admission medications   Medication Sig Start Date End Date Taking? Authorizing Provider  atenolol (TENORMIN) 50 MG tablet Take 50 mg by mouth daily.   Yes Historical Provider, MD  ENSURE (ENSURE) Take 237 mLs by mouth 2 (two) times daily between meals.    Yes  Historical Provider, MD  ferrous sulfate 325 (65 FE) MG tablet Take 325 mg by mouth daily with breakfast. Reported on 05/27/2015   Yes Historical Provider, MD  hyaluronate sodium (RADIAPLEXRX) GEL Apply 1 application topically 2 (two) times daily.   Yes [name], MD  Iron-Vitamins (GERITOL) LIQD Take 5 mLs by mouth daily.   Yes Historical Provider, MD  tamoxifen (NOLVADEX) 20 MG tablet Take 1 tablet (20 mg total) by mouth daily. 08/04/15  Yes [name], MD  diazepam (VALIUM) 5 MG tablet Take 1 tablet (5 mg total) by mouth 2 (two) times daily. 10/06/15    Nona Dell, PA-C  ibuprofen (ADVIL,MOTRIN) 600 MG tablet Take 1 tablet (600 mg total) by mouth every 6 (six) hours as needed. 10/06/15   Nona Dell, PA-C  LORazepam (ATIVAN) 0.5 MG tablet TAKE 1 TABLET EVERY 6 HOURS AS NEEDED ANXIETY Patient not taking: Reported on 10/06/2015 08/23/14   [name], MD  oxyCODONE-acetaminophen (PERCOCET/ROXICET) 5-325 MG tablet Take 1 tablet by mouth every 4 (four) hours as needed for severe pain. 10/06/15   Chesley Noon [place], PA-C   BP 125/88 mmHg  Pulse 102  Temp(Src) 98.9 F (37.2 C) (Oral)  Resp 20  SpO2 98%  LMP 09/07/2015 Physical Exam  Constitutional: She is oriented to person, place, and time. She appears well-developed and well-nourished.  HENT:  Head: Normocephalic and atraumatic.  Mouth/Throat: Oropharynx is clear and moist. No oropharyngeal exudate.  Eyes: Conjunctivae and EOM are normal. Pupils are equal, round, and reactive to light. Right eye exhibits no discharge. Left eye exhibits no discharge. No scleral icterus.  Neck: Normal range of motion. Neck supple.  Cardiovascular: Normal rate, regular rhythm, normal heart sounds and intact distal pulses.   HR 98  Pulmonary/Chest: Effort normal and breath sounds normal. No respiratory distress. She has no wheezes. She has no rales. She exhibits no tenderness.  Abdominal: Soft. Bowel sounds are normal. She exhibits no distension and no mass. There is no tenderness. There is no rebound and no guarding.  Musculoskeletal: Normal range of motion. She exhibits tenderness. She exhibits no edema.  No midline C or L tenderness. TTP over thoracic midline spine. Full range of motion of neck and back but endorses pain. Full range of motion of bilateral upper and lower extremities, with 5/5 strength. Sensation intact. 2+ radial and PT pulses. Cap refill <2 seconds.   Neurological: She is alert and oriented to person, place, and time. She has normal strength. No sensory deficit.   Skin: Skin is warm and dry. She is not diaphoretic.  Nursing note and vitals reviewed.   ED Course  Procedures (including critical care time) Labs Review Labs Reviewed - No data to display  Imaging Review Dg Chest 2 View  10/06/2015  CLINICAL DATA:  Chest pain. History of right breast cancer status post lumpectomy chemotherapy. EXAM: CHEST  2 VIEW COMPARISON:  Chest x-rays dated 01/26/2014. FINDINGS: Heart size is normal. Left chest wall Port-A-Cath is adequately positioned with tip at the level of the mid SVC. Lungs are clear. Osseous structures about the chest are unremarkable. Surgical clips overlying the right chest wall are presumably related to the given history of breast cancer and lumpectomy. IMPRESSION: No acute findings.  Lungs are clear. Electronically Signed   By: Franki Cabot M.D.   On: 10/06/2015 14:03   Dg Thoracic Spine 2 View  10/06/2015  CLINICAL DATA:  Pain in the mid to lower back for several days, no trauma EXAM:  THORACIC SPINE 2 VIEWS COMPARISON:  Chest x-ray of 01/26/2014 FINDINGS: The thoracic vertebrae are in normal alignment. Intervertebral disc spaces appear normal. No significant degenerative change is seen. No compression deformity is noted. No prominent paravertebral soft tissue is noted. IMPRESSION: Negative. Electronically Signed   By: Ivar Drape M.D.   On: 10/06/2015 14:04   Dg Lumbar Spine Complete  10/06/2015  CLINICAL DATA:  Lumbago for 1 day EXAM: LUMBAR SPINE - COMPLETE 4+ VIEW COMPARISON:  None. FINDINGS: Frontal, lateral, spot lumbosacral lateral, and bilateral oblique views were obtained. There are 5 non-rib-bearing lumbar type vertebral bodies. There is slight dextroscoliosis. There is no fracture or spondylolisthesis. There is mild disc space narrowing at T12-L1 and L1-2. Other disc spaces appear normal. There is no appreciable facet arthropathy. IMPRESSION: Mild disc space narrowing at T12-L1 L1-2. Other disc spaces appear normal. No appreciable facet  arthropathy. No fracture or spondylolisthesis. Electronically Signed   By: Lowella Grip III M.D.   On: 10/06/2015 12:10   I have personally reviewed and evaluated these images and lab results as part of my medical decision-making.   EKG Interpretation   Date/Time:  Thursday October 06 2015 11:44:07 EDT Ventricular Rate:  102 PR Interval:  151 QRS Duration: 86 QT Interval:  340 QTC Calculation: 443 R Axis:   50 Text Interpretation:  Sinus tachycardia Abnormal R-wave progression, early  transition Confirmed by BEATON  MD, ROBERT ([id number]) on 10/06/2015 1:39:08 PM      MDM   Final diagnoses:  Lower back pain  Midline thoracic back pain    Patient presents with mid back pain that started after stepping out of her car yesterday morning. Denies any recent fall, trauma, injury. Hx of breast cancer and stopped taking Tamoxifen 2 weeks ago. Otherwise no other back pain red flags. On evaluation by initial provider in fast track, HR 126, pt given dilaudid, ordered lumbar spine xray and moved to an acute room due to concern for regarding tachycardia. On my initial evaluation, HR 96-110. Pt reports her pain has improved since getting the pain meds. Exam revealed TTP over thoracic midline spine. Remaining exam unremarkable, no neuro deficits. Lumbar spine xray negative. Discussed case with Dr. Audie Pinto. Due to pt's hx of breast CA and thoracic tenderness on exam, will order thoracic spine xray and CXR to evaluate for metastatic disease and further evaluate back pain. CXR and thoracic spine xray negative. On reevaluation, patient reports her pain has improved. HR 98. I suspect patient's symptoms are likely due to muscle strain/spasm associated with movement of getting out of her car yesterday. I also suspect patient's tachycardia is associated with her back pain due to heart rate decreased seen after pain meds. Patient continues to deny chest pain or shortness of breath, I do not suspect DVT/PE at this time  and do not feel any further workup or imaging is warranted. Discussed results and plan for discharge with patient. Plan to discharge patient home with pain meds, muscle relaxant and symptomatic treatment. Advised patient to follow up with her PCP. Discussed return precautions with patient.    Chesley Noon [place], [place] 10/06/15 1438  Leonard Schwartz, MD 10/07/15 [phone]

## 2015-10-06 NOTE — ED Notes (Addendum)
C/o LBP since yesterday. Hx of breast Ca, not currently on chemo. Stopped Tamoxifen 3 weeks ago. No known injury. Pt is tachycardic.

## 2015-10-06 NOTE — ED Notes (Signed)
PA ordered pt moved to acute side. Charge nurse notified.

## 2015-10-06 NOTE — ED Notes (Signed)
Pt is in stable condition upon d/c and ambulates from ED. 

## 2015-10-06 NOTE — ED Notes (Signed)
Pt questioned about possible pregnancy. States she has not had intercourse in over a year.

## 2015-10-06 NOTE — ED Provider Notes (Signed)
Debbie Martinez is a 40 y.o. female, with a h/o breast CA, who presents to the Emergency Department complaining of sudden onset of low back pain onset yesterday morning. Pt denies fall, injury or heavy lifting. Pt states that back pain wraps around into her right side and is exacerbated with movement and lying on her back. She reports hot flashes last night that she attributes to Tamoxifen which she stopped 2 weeks ago. Pt has tried ibuprofen, Aleve and BC powder without significant relief. She denies fever, chest pain, SOB, bladder/bowel incontinence, numbness/tinlging in LE. Pt last received chemo in April 2016. No h/o DVT/PE.   Patient with a HR of 126.  She is having lower back pain.  No acute injury or trauma.  She denies CP or SOB.  Due to the level of tachycardia and PMH, feel that the patient should be moved to the acute side of the ED.  Lumbar xray ordered to evaluate for mets or pathological fracture.  Dilaudid ordered for pain.  Patient care will be continued by another provider.   Hyman Bible, PA-C 10/06/15 [place], MD 10/06/15 [phone]

## 2015-10-06 NOTE — Discharge Instructions (Signed)
Take your medications as prescribed. I also recommend applying ice to affected area for 15-20 minutes 3-4 times daily for pain relief. I recommended resting and refraining from doing any heavy lifting or straining movements for the next few days until your pain has improved. Follow-up with your primary care provider in the next 4-5 days if her symptoms have not improved. Please return to the Emergency Department if symptoms worsen or new onset of fever, numbness, tingling, groin numbness, abdominal pain, urinary retention, loss of bowel or bladder, weakness, chest pain, shortness of breath.

## 2015-10-06 NOTE — ED Notes (Signed)
Pt states she is here alone today.

## 2015-11-03 ENCOUNTER — Encounter (HOSPITAL_COMMUNITY): Payer: Self-pay | Admitting: Emergency Medicine

## 2015-11-03 ENCOUNTER — Emergency Department (HOSPITAL_COMMUNITY)
Admission: EM | Admit: 2015-11-03 | Discharge: 2015-11-03 | Disposition: A | Discharge status: Home / Self Care | Payer: Medicaid Other | Attending: Emergency Medicine | Admitting: Emergency Medicine

## 2015-11-03 DIAGNOSIS — M62830 Muscle spasm of back: Secondary | ICD-10-CM | POA: Insufficient documentation

## 2015-11-03 DIAGNOSIS — N898 Other specified noninflammatory disorders of vagina: Secondary | ICD-10-CM

## 2015-11-03 DIAGNOSIS — R8299 Other abnormal findings in urine: Secondary | ICD-10-CM | POA: Diagnosis not present

## 2015-11-03 DIAGNOSIS — Z79899 Other long term (current) drug therapy: Secondary | ICD-10-CM | POA: Insufficient documentation

## 2015-11-03 DIAGNOSIS — R82998 Other abnormal findings in urine: Secondary | ICD-10-CM

## 2015-11-03 DIAGNOSIS — I10 Essential (primary) hypertension: Secondary | ICD-10-CM | POA: Diagnosis not present

## 2015-11-03 DIAGNOSIS — Z853 Personal history of malignant neoplasm of breast: Secondary | ICD-10-CM | POA: Insufficient documentation

## 2015-11-03 LAB — URINALYSIS, ROUTINE W REFLEX MICROSCOPIC
Bilirubin Urine: NEGATIVE
Glucose, UA: NEGATIVE mg/dL
KETONES UR: NEGATIVE mg/dL
NITRITE: NEGATIVE
PROTEIN: NEGATIVE mg/dL
Specific Gravity, Urine: 1.019 (ref 1.005–1.030)
pH: 7 (ref 5.0–8.0)

## 2015-11-03 LAB — WET PREP, GENITAL
Clue Cells Wet Prep HPF POC: NONE SEEN
SPERM: NONE SEEN
Trich, Wet Prep: NONE SEEN
Yeast Wet Prep HPF POC: NONE SEEN

## 2015-11-03 LAB — URINE MICROSCOPIC-ADD ON

## 2015-11-03 LAB — POC URINE PREG, ED: PREG TEST UR: NEGATIVE

## 2015-11-03 MED ORDER — OXYCODONE-ACETAMINOPHEN 5-325 MG PO TABS
1.0000 | ORAL_TABLET | Freq: Once | ORAL | Status: AC
  Administered 2015-11-03: 1 via ORAL
  Filled 2015-11-03: qty 1

## 2015-11-03 MED ORDER — IBUPROFEN 600 MG PO TABS: 600.0000 mg | ORAL_TABLET | Freq: Four times a day (QID) | ORAL | Status: DC | PRN

## 2015-11-03 MED ORDER — METHOCARBAMOL 500 MG PO TABS
500.0000 mg | ORAL_TABLET | Freq: Once | ORAL | Status: AC
  Administered 2015-11-03: 500 mg via ORAL
  Filled 2015-11-03: qty 1

## 2015-11-03 MED ORDER — OXYCODONE-ACETAMINOPHEN 5-325 MG PO TABS: 1.0000 | ORAL_TABLET | Freq: Four times a day (QID) | ORAL | Status: DC | PRN

## 2015-11-03 MED ORDER — METHOCARBAMOL 500 MG PO TABS: 500.0000 mg | ORAL_TABLET | Freq: Two times a day (BID) | ORAL | Status: DC | PRN

## 2015-11-03 NOTE — ED Notes (Signed)
Pt reports lower back pain for the past month and was seen for same at that time. Pt reports 2 weeks ago she felt a crack in her back when she bent over. Took prescription for back pain which helped but still having dull ache in lower back that radiates to L flank. Pt has also had vaginal itching for the past few months. Stopped home tamoxifen per PCP, but still having itching.

## 2015-11-03 NOTE — Discharge Instructions (Signed)
Ibuprofen as needed for mild to moderate pain. Use narcotic pain medication only as needed for severe pain - This can make you very drowsy - please do not drink alcohol, operate heavy machinery or drive on this medication. Robaxin is your muscle relaxer which can also make you drowsy.   Please follow up with your OBGYN at the next available appointment for discussion about today's visit. Your urine was sent for a culture. As discussed, someone will call you in approximately 3 days if culture shows that you need to be placed on antibiotics.  Please follow up with your primary physician in regard's to your back pain and further management of ongoing pain.   Return to ER if you develop new or worsening symptoms, fever, numbness/weakness, inability to urinate, abdominal pain, or any additional concerns.    COLD THERAPY DIRECTIONS:  Ice or gel packs can be used to reduce both pain and swelling. Ice is the most helpful within the first 24 to 48 hours after an injury or flareup from overusing a muscle or joint.  Ice is effective, has very few side effects, and is safe for most people to use.   If you expose your skin to cold temperatures for too long or without the proper protection, you can damage your skin or nerves. Watch for signs of skin damage due to cold.   HOME CARE INSTRUCTIONS  Follow these tips to use ice and cold packs safely.  Place a dry or damp towel between the ice and skin. A damp towel will cool the skin more quickly, so you may need to shorten the time that the ice is used.  For a more rapid response, add gentle compression to the ice.  Ice for no more than 10 to 20 minutes at a time. The bonier the area you are icing, the less time it will take to get the benefits of ice.  Check your skin after 5 minutes to make sure there are no signs of a poor response to cold or skin damage.  Rest 20 minutes or more in between uses.  Once your skin is numb, you can end your treatment. You can test  numbness by very lightly touching your skin. The touch should be so light that you do not see the skin dimple from the pressure of your fingertip. When using ice, most people will feel these normal sensations in this order: cold, burning, aching, and numbness.

## 2015-11-03 NOTE — ED Provider Notes (Signed)
CSN: [id number]     Arrival date & time 11/03/15  [id number] History   First MD Initiated Contact with Patient 11/03/15 0913     Chief Complaint  Patient presents with  . Back Pain  . Vaginal Itching    (Consider location/radiation/quality/duration/timing/severity/associated sxs/prior Treatment) Patient is a 40 y.o. female presenting with back pain and vaginal itching. The history is provided by the patient and medical records. No language interpreter was used.  Back Pain Associated symptoms: no abdominal pain, no dysuria, no fever and no headaches   Vaginal Itching Pertinent negatives include no abdominal pain, chills, congestion, coughing, fever, headaches, nausea, neck pain, rash or vomiting.    Debbie Martinez is a 40 y.o. female  with a PMH of breast cx in remission, HTN who presents to the Emergency Department for two complaints:   Right mid-to-low back pain x 1 month. Seen and evaluated on 6/15 for same. X-rays at that time were unremarkable. She took pain medication and muscle relaxer with good relief and symptoms, however she is now out of these medications. She states that about a week ago, she leaned over to pick something up and felt a pop. Her back pain has been persistent since that time and described as an aching pain that is always present and occasional cramping pain with certain movements. Pain is worse with movement and palpation. Better with rest. She tried a heating pad and icy-hot yesterday but that provided minimal relief of pain. She went to her PCP today, but unfortunately the clinic was closed, however she states she has intentions to call and schedule a follow up appointment. Denies b/b incontinence, fever, numbness/tingling, muscle weakness, recent IVDU.   Vaginal discharge x 2-3 months with associated vaginal itching. She denies dysuria, urinary hesitancy/frequency, vaginal bleeding, pelvic pain, abdominal pain, n/v. Denies concern for STD's and no intercourse in well  over 1 year. Hx of yeast infections in the past which typically improve with diflucan. She was on tamoxifen and states symptoms began right around the time she and her oncologist decided to quit taking the medication.    Past Medical History  Diagnosis Date  . Hypertension   . 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 Anxiety     gets sweaty and short of breath  . Shortness of breath     'anxiety'  . GERD (gastroesophageal reflux disease)     does not take anything  . Gestational diabetes    Past Surgical History  Procedure Laterality Date  . Keloid excision Left     ear  . Portacath placement Left 01/26/2014    Procedure: INSERTION PORT-A-CATH;  Surgeon: Autumn Messing III, MD;  Location: [facility];  Service: General;  Laterality: Left;  . Cesarean section      2008  . Breast lumpectomy with needle localization and axillary sentinel lymph node bx Right 08/23/2014    Procedure: RIGHT BREAST LUMPECTOMY WITH NEEDLE LOCALIZATION(X'S 2)AND RIGHT AXILLARY SENTINEL LYMPH NODE Biopies;  Surgeon: Autumn Messing III, MD;  Location: [facility];  Service: General;  Laterality: Right;  . Re-excision of breast cancer,superior margins Right 09/15/2014    Procedure: RE-EXCISION OF RIGHT BREAST CANCER,SUPERIOR AND INFERIOR MARGINS;  Surgeon: Autumn Messing III, MD;  Location: [facility];  Service: General;  Laterality: Right;   Family History  Problem Relation Age of Onset  . Heart attack Father 4  . Lupus Maternal Aunt   . Prostate cancer Maternal Grandfather 59  .  Dementia Paternal Grandmother   . Leukemia Cousin 58    paternal first cousin   Social History  Substance Use Topics  . Smoking status: Never Smoker   . Smokeless tobacco: None  . Alcohol Use: Yes     Comment: Rarely- maybe  every 6 months   OB History    No data available     Review of Systems  Constitutional: Negative for fever and chills.  HENT: Negative for congestion.   Eyes: Negative for visual disturbance.    Respiratory: Negative for cough and shortness of breath.   Cardiovascular: Negative.   Gastrointestinal: Negative for nausea, vomiting and abdominal pain.  Genitourinary: Positive for vaginal discharge. Negative for dysuria, vaginal bleeding and vaginal pain.  Musculoskeletal: Positive for back pain. Negative for neck pain.  Skin: Negative for rash.  Neurological: Negative for headaches.      Allergies  Lisinopril and Tamoxifen  Home Medications   Prior to Admission medications   Medication Sig Start Date End Date Taking? Authorizing Provider  atenolol (TENORMIN) 50 MG tablet Take 50 mg by mouth daily.   Yes Historical Provider, MD  Cholecalciferol (VITAMIN D-3 PO) Take 1 capsule by mouth daily.   Yes Historical Provider, MD  diazepam (VALIUM) 5 MG tablet Take 1 tablet (5 mg total) by mouth 2 (two) times daily. 10/06/15  Yes Chesley Noon Nadeau, PA-C  ENSURE (ENSURE) Take 237 mLs by mouth 2 (two) times daily between meals.    Yes Historical Provider, MD  Iron-Vitamins (GERITOL) LIQD Take 5 mLs by mouth daily.   Yes Historical Provider, MD  ibuprofen (ADVIL,MOTRIN) 600 MG tablet Take 1 tablet (600 mg total) by mouth every 6 (six) hours as needed for moderate pain. 11/03/15   Ozella Almond Elverda Wendel, PA-C  methocarbamol (ROBAXIN) 500 MG tablet Take 1 tablet (500 mg total) by mouth 2 (two) times daily as needed for muscle spasms. 11/03/15   Ozella Almond Noreene Boreman, PA-C  oxyCODONE-acetaminophen (PERCOCET/ROXICET) 5-325 MG tablet Take 1 tablet by mouth every 6 (six) hours as needed for severe pain. 11/03/15   Ozella Almond Bresha Hosack, PA-C  tamoxifen (NOLVADEX) 20 MG tablet Take 1 tablet (20 mg total) by mouth daily. Patient not taking: Reported on 11/03/2015 08/04/15   [name], MD   BP 149/92 mmHg  Pulse 85  Temp(Src) 98.8 F (37.1 C) (Oral)  Resp 16  SpO2 99%  LMP 09/07/2015 Physical Exam  Constitutional: She is oriented to person, place, and time. She appears well-developed and  well-nourished. No distress.  HENT:  Head: Normocephalic and atraumatic.  Neck:  Full ROM intact without spinous process TTP. No bony stepoffs or deformities. No paraspinous muscle TTP or muscle spasms. No bruising, erythema, or swelling.  Cardiovascular: Normal rate, regular rhythm, normal heart sounds and intact distal pulses.  Exam reveals no gallop and no friction rub.   No murmur heard. Pulmonary/Chest: Effort normal and breath sounds normal. No respiratory distress. She has no wheezes. She has no rales. She exhibits no tenderness.  Abdominal: Soft. Bowel sounds are normal. She exhibits no distension. There is no tenderness.  Genitourinary:  Chaperone present for exam. + white discharge. No rashes, lesions, or tenderness to external genitalia. No erythema, injury, or tenderness to vaginal mucosa. No bleeding within vaginal vault. No adnexal masses, tenderness, or fullness. No CMT.   Musculoskeletal:       Arms: TTP of paraspinal musculature as depicted in image. No midline tenderness. 5/5 muscle strength in bilateral LE's.  Neurological: She is alert  and oriented to person, place, and time. She has normal reflexes.  Bilateral lower extremities neurovascularly intact.   Skin: Skin is warm and dry.  Nursing note and vitals reviewed.   ED Course  Procedures (including critical care time) Labs Review Labs Reviewed  WET PREP, GENITAL - Abnormal; Notable for the following:    WBC, Wet Prep HPF POC MANY (*)    All other components within normal limits  URINALYSIS, ROUTINE W REFLEX MICROSCOPIC (NOT AT [facility]) - Abnormal; Notable for the following:    APPearance CLOUDY (*)    Hgb urine dipstick SMALL (*)    Leukocytes, UA MODERATE (*)    All other components within normal limits  URINE MICROSCOPIC-ADD ON - Abnormal; Notable for the following:    Squamous Epithelial / LPF 0-5 (*)    Bacteria, UA MANY (*)    All other components within normal limits  URINE CULTURE  POC URINE PREG, ED    GC/CHLAMYDIA PROBE AMP ([place]) NOT AT [facility]    Imaging Review No results found. I have personally reviewed and evaluated these images and lab results as part of my medical decision-making.   EKG Interpretation None      MDM   Final diagnoses:  Muscle spasm of back  Vaginal discharge  Leukocytes in urine   Debbie Martinez is a 40 y.o. female who presents to ED with two complaints:   1. Persistent right-sided thoracic back pain. Appears to be muscle strain / spasms. No bony tenderness on exam. LE NVI. No red flag back pain symptoms. Robaxin and pain meds given in ED and patient with noticeable improvement. Able to ambulate in ED. Chart reviewed from visit on 6/15 where x-rays were performed and unremarkable. Discussed with attending, Dr. Kathrynn Humble who agrees that repeat imaging is not warranted at this time. Will give rx for robaxin and very short course of pain meds then encouraged PCP follow up for further management.   2. Vaginal discharge x 2-3 months. Denies urinary symptoms. No intercourse in > 1 year. UA reviewed which shows moderate leuks, 6-30 WBC, many bacteria. Pelvic performed with no tenderness. + white discharge. Wet prep shows many WBC's but otherwise unremarkable. G&C obtained. Patient does have OBGYN she follows up with regularly. Repeat abdominal exam benign. Will send urine cx. Given no urinary symptoms, no CMT, and low suspicion for G&C will not treat at this point. Patient informed of urine culture and G&C pending and that she will be notified if results are positive.   Evaluation does not show pathology that would require ongoing emergent intervention or inpatient treatment. Patient is hemodynamically stable and mentating appropriately. Discussed findings and plan with patient who agrees with treatment plan as dictated. Return precautions discussed and all questions answered.   Patient discussed with Dr. Kathrynn Humble who agrees with treatment plan.    [facility] Sumiya Mamaril, PA-C 11/03/15 1524  Varney Biles, MD 11/03/15 1534

## 2015-11-04 LAB — GC/CHLAMYDIA PROBE AMP (~~LOC~~) NOT AT ARMC
CHLAMYDIA, DNA PROBE: NEGATIVE
NEISSERIA GONORRHEA: NEGATIVE

## 2015-11-04 LAB — URINE CULTURE

## 2015-12-19 ENCOUNTER — Telehealth: Payer: Self-pay | Admitting: *Deleted

## 2015-12-19 NOTE — Telephone Encounter (Addendum)
FYI "I'm having muscle spasms to my lower back and legs.  I've been to the hospital twice since June.  Is this a side effect from chemotherapy?" Last Taxol 07-01-2014 after A/C.  Will notify provider and advised this is not a side effect from chemotherapy.

## 2015-12-31 ENCOUNTER — Emergency Department (HOSPITAL_COMMUNITY): Payer: Medicaid Other

## 2015-12-31 ENCOUNTER — Inpatient Hospital Stay (HOSPITAL_COMMUNITY)
Admission: EM | Admit: 2015-12-31 | Discharge: 2016-01-06 | DRG: 948 | Disposition: A | Discharge status: Home Health | Payer: Medicaid Other | Attending: Internal Medicine | Admitting: Internal Medicine

## 2015-12-31 ENCOUNTER — Encounter (HOSPITAL_COMMUNITY): Payer: Self-pay | Admitting: *Deleted

## 2015-12-31 DIAGNOSIS — Z8249 Family history of ischemic heart disease and other diseases of the circulatory system: Secondary | ICD-10-CM | POA: Diagnosis not present

## 2015-12-31 DIAGNOSIS — K5903 Drug induced constipation: Secondary | ICD-10-CM | POA: Diagnosis present

## 2015-12-31 DIAGNOSIS — F419 Anxiety disorder, unspecified: Secondary | ICD-10-CM | POA: Diagnosis present

## 2015-12-31 DIAGNOSIS — K5909 Other constipation: Secondary | ICD-10-CM

## 2015-12-31 DIAGNOSIS — T380X5A Adverse effect of glucocorticoids and synthetic analogues, initial encounter: Secondary | ICD-10-CM | POA: Diagnosis present

## 2015-12-31 DIAGNOSIS — M8458XA Pathological fracture in neoplastic disease, other specified site, initial encounter for fracture: Secondary | ICD-10-CM | POA: Diagnosis present

## 2015-12-31 DIAGNOSIS — C799 Secondary malignant neoplasm of unspecified site: Secondary | ICD-10-CM | POA: Diagnosis not present

## 2015-12-31 DIAGNOSIS — T40605A Adverse effect of unspecified narcotics, initial encounter: Secondary | ICD-10-CM | POA: Diagnosis present

## 2015-12-31 DIAGNOSIS — C50919 Malignant neoplasm of unspecified site of unspecified female breast: Secondary | ICD-10-CM

## 2015-12-31 DIAGNOSIS — M545 Low back pain: Secondary | ICD-10-CM | POA: Diagnosis present

## 2015-12-31 DIAGNOSIS — S32049A Unspecified fracture of fourth lumbar vertebra, initial encounter for closed fracture: Secondary | ICD-10-CM

## 2015-12-31 DIAGNOSIS — M549 Dorsalgia, unspecified: Secondary | ICD-10-CM | POA: Diagnosis not present

## 2015-12-31 DIAGNOSIS — R739 Hyperglycemia, unspecified: Secondary | ICD-10-CM | POA: Diagnosis present

## 2015-12-31 DIAGNOSIS — M5440 Lumbago with sciatica, unspecified side: Secondary | ICD-10-CM | POA: Diagnosis not present

## 2015-12-31 DIAGNOSIS — E162 Hypoglycemia, unspecified: Secondary | ICD-10-CM | POA: Diagnosis present

## 2015-12-31 DIAGNOSIS — C50211 Malignant neoplasm of upper-inner quadrant of right female breast: Secondary | ICD-10-CM | POA: Diagnosis present

## 2015-12-31 DIAGNOSIS — R350 Frequency of micturition: Secondary | ICD-10-CM | POA: Diagnosis present

## 2015-12-31 DIAGNOSIS — Z8632 Personal history of gestational diabetes: Secondary | ICD-10-CM

## 2015-12-31 DIAGNOSIS — K219 Gastro-esophageal reflux disease without esophagitis: Secondary | ICD-10-CM | POA: Diagnosis present

## 2015-12-31 DIAGNOSIS — G893 Neoplasm related pain (acute) (chronic): Principal | ICD-10-CM | POA: Diagnosis present

## 2015-12-31 DIAGNOSIS — M62838 Other muscle spasm: Secondary | ICD-10-CM | POA: Diagnosis present

## 2015-12-31 DIAGNOSIS — Z170 Estrogen receptor positive status [ER+]: Secondary | ICD-10-CM

## 2015-12-31 DIAGNOSIS — Z888 Allergy status to other drugs, medicaments and biological substances status: Secondary | ICD-10-CM | POA: Diagnosis not present

## 2015-12-31 DIAGNOSIS — C7951 Secondary malignant neoplasm of bone: Secondary | ICD-10-CM | POA: Diagnosis present

## 2015-12-31 DIAGNOSIS — C7931 Secondary malignant neoplasm of brain: Secondary | ICD-10-CM | POA: Diagnosis not present

## 2015-12-31 DIAGNOSIS — Z806 Family history of leukemia: Secondary | ICD-10-CM

## 2015-12-31 DIAGNOSIS — M4806 Spinal stenosis, lumbar region: Secondary | ICD-10-CM | POA: Diagnosis present

## 2015-12-31 DIAGNOSIS — Z79899 Other long term (current) drug therapy: Secondary | ICD-10-CM | POA: Diagnosis not present

## 2015-12-31 DIAGNOSIS — R2689 Other abnormalities of gait and mobility: Secondary | ICD-10-CM

## 2015-12-31 DIAGNOSIS — Z923 Personal history of irradiation: Secondary | ICD-10-CM

## 2015-12-31 DIAGNOSIS — R52 Pain, unspecified: Secondary | ICD-10-CM

## 2015-12-31 DIAGNOSIS — I10 Essential (primary) hypertension: Secondary | ICD-10-CM | POA: Diagnosis present

## 2015-12-31 DIAGNOSIS — M4800 Spinal stenosis, site unspecified: Secondary | ICD-10-CM

## 2015-12-31 LAB — CBC WITH DIFFERENTIAL/PLATELET
BASOS ABS: 0 10*3/uL (ref 0.0–0.1)
BASOS PCT: 0 %
EOS PCT: 1 %
Eosinophils Absolute: 0.1 10*3/uL (ref 0.0–0.7)
HEMATOCRIT: 35.8 % — AB (ref 36.0–46.0)
Hemoglobin: 10.9 g/dL — ABNORMAL LOW (ref 12.0–15.0)
Lymphocytes Relative: 24 %
Lymphs Abs: 2 10*3/uL (ref 0.7–4.0)
MCH: 25.5 pg — ABNORMAL LOW (ref 26.0–34.0)
MCHC: 30.4 g/dL (ref 30.0–36.0)
MCV: 83.6 fL (ref 78.0–100.0)
MONO ABS: 0.7 10*3/uL (ref 0.1–1.0)
Monocytes Relative: 9 %
NEUTROS ABS: 5.4 10*3/uL (ref 1.7–7.7)
Neutrophils Relative %: 66 %
PLATELETS: 325 10*3/uL (ref 150–400)
RBC: 4.28 MIL/uL (ref 3.87–5.11)
RDW: 14.6 % (ref 11.5–15.5)
WBC: 8.2 10*3/uL (ref 4.0–10.5)

## 2015-12-31 LAB — COMPREHENSIVE METABOLIC PANEL
ALK PHOS: 107 U/L (ref 38–126)
ALT: 24 U/L (ref 14–54)
AST: 25 U/L (ref 15–41)
Albumin: 3.4 g/dL — ABNORMAL LOW (ref 3.5–5.0)
Anion gap: 8 (ref 5–15)
BILIRUBIN TOTAL: 0.5 mg/dL (ref 0.3–1.2)
BUN: 10 mg/dL (ref 6–20)
CALCIUM: 10.2 mg/dL (ref 8.9–10.3)
CHLORIDE: 104 mmol/L (ref 101–111)
CO2: 30 mmol/L (ref 22–32)
CREATININE: 0.77 mg/dL (ref 0.44–1.00)
GLUCOSE: 99 mg/dL (ref 65–99)
Potassium: 4.4 mmol/L (ref 3.5–5.1)
Sodium: 142 mmol/L (ref 135–145)
Total Protein: 8.1 g/dL (ref 6.5–8.1)

## 2015-12-31 MED ORDER — ENSURE PO LIQD: 237.0000 mL | Freq: Two times a day (BID) | ORAL | Status: DC

## 2015-12-31 MED ORDER — ONDANSETRON 4 MG PO TBDP
8.0000 mg | ORAL_TABLET | Freq: Three times a day (TID) | ORAL | Status: DC | PRN
Start: 2015-12-31 — End: 2016-01-06
  Administered 2016-01-02: 8 mg via ORAL
  Filled 2015-12-31: qty 2

## 2015-12-31 MED ORDER — HYDROMORPHONE HCL 1 MG/ML IJ SOLN
1.0000 mg | INTRAMUSCULAR | Status: DC | PRN
  Administered 2016-01-01 – 2016-01-03 (×10): 1 mg via INTRAVENOUS
  Filled 2015-12-31 (×10): qty 1

## 2015-12-31 MED ORDER — DEXAMETHASONE SODIUM PHOSPHATE 10 MG/ML IJ SOLN
10.0000 mg | Freq: Once | INTRAMUSCULAR | Status: AC
  Administered 2015-12-31: 10 mg via INTRAVENOUS
  Filled 2015-12-31: qty 1

## 2015-12-31 MED ORDER — HYDROMORPHONE HCL 1 MG/ML IJ SOLN
0.5000 mg | Freq: Once | INTRAMUSCULAR | Status: AC
  Administered 2015-12-31: 0.5 mg via INTRAVENOUS
  Filled 2015-12-31: qty 1

## 2015-12-31 MED ORDER — HYDROMORPHONE HCL 1 MG/ML IJ SOLN
1.0000 mg | Freq: Once | INTRAMUSCULAR | Status: AC
  Administered 2015-12-31: 1 mg via INTRAMUSCULAR
  Filled 2015-12-31: qty 1

## 2015-12-31 MED ORDER — METHOCARBAMOL 500 MG PO TABS
1000.0000 mg | ORAL_TABLET | Freq: Four times a day (QID) | ORAL | Status: DC | PRN
  Administered 2016-01-03 – 2016-01-06 (×6): 1000 mg via ORAL
  Filled 2015-12-31 (×7): qty 2

## 2015-12-31 MED ORDER — OXYCODONE-ACETAMINOPHEN 5-325 MG PO TABS
1.0000 | ORAL_TABLET | ORAL | Status: DC | PRN
  Administered 2016-01-01 – 2016-01-02 (×3): 1 via ORAL
  Filled 2015-12-31 (×3): qty 1

## 2015-12-31 MED ORDER — DIAZEPAM 5 MG PO TABS
5.0000 mg | ORAL_TABLET | Freq: Two times a day (BID) | ORAL | Status: DC | PRN
  Administered 2016-01-01: 5 mg via ORAL
  Filled 2015-12-31: qty 1

## 2015-12-31 MED ORDER — ONDANSETRON 4 MG PO TBDP
8.0000 mg | ORAL_TABLET | Freq: Once | ORAL | Status: AC
  Administered 2015-12-31: 8 mg via ORAL
  Filled 2015-12-31: qty 2

## 2015-12-31 MED ORDER — ATENOLOL 50 MG PO TABS
50.0000 mg | ORAL_TABLET | Freq: Every day | ORAL | Status: DC
  Administered 2016-01-01 – 2016-01-06 (×6): 50 mg via ORAL
  Filled 2015-12-31 (×6): qty 1

## 2015-12-31 MED ORDER — ACETAMINOPHEN 325 MG PO TABS: 650.0000 mg | ORAL_TABLET | Freq: Four times a day (QID) | ORAL | Status: DC | PRN

## 2015-12-31 MED ORDER — MAGNESIUM SALICYLATE 325 MG PO TABS: ORAL_TABLET | Freq: Every day | ORAL | Status: DC | PRN

## 2015-12-31 MED ORDER — SODIUM CHLORIDE 0.9 % IV SOLN
INTRAVENOUS | Status: DC
  Administered 2016-01-01: 01:00:00 via INTRAVENOUS

## 2015-12-31 MED ORDER — GERITOL TONIC PO LIQD: 5.0000 mL | Freq: Every day | ORAL | Status: DC

## 2015-12-31 MED ORDER — ACETAMINOPHEN 650 MG RE SUPP: 650.0000 mg | Freq: Four times a day (QID) | RECTAL | Status: DC | PRN

## 2015-12-31 MED ORDER — VITAMIN D 1000 UNITS PO TABS
1000.0000 [IU] | ORAL_TABLET | Freq: Every day | ORAL | Status: DC
  Administered 2016-01-01 – 2016-01-06 (×6): 1000 [IU] via ORAL
  Filled 2015-12-31 (×6): qty 1

## 2015-12-31 MED ORDER — DEXAMETHASONE SODIUM PHOSPHATE 10 MG/ML IJ SOLN
4.0000 mg | Freq: Four times a day (QID) | INTRAMUSCULAR | Status: DC
  Administered 2016-01-01: 4 mg via INTRAVENOUS
  Filled 2015-12-31: qty 1

## 2015-12-31 NOTE — ED Notes (Signed)
Pt in gown, ready for MRI

## 2015-12-31 NOTE — Progress Notes (Signed)
   12/31/15 2300  Clinical Encounter Type  Visited With Patient  Visit Type Spiritual support  Referral From Nurse  Spiritual Encounters  Spiritual Needs Sacred text;Prayer;Emotional  Stress Factors  Patient Stress Factors Health changes;Major life changes  Chaplain paged by nursing staff that Pt would like to see a chaplain. Explored emotional needs and resources. Explored hope. Listened empathically. Provided prayer. Provided Bible.

## 2015-12-31 NOTE — ED Notes (Addendum)
Provided patient with jello and coke.

## 2015-12-31 NOTE — ED Notes (Signed)
MD aware of patient eating jello, sts okay, but she will be NPO after that.

## 2015-12-31 NOTE — ED Notes (Signed)
Dr Niu at bedside 

## 2015-12-31 NOTE — H&P (Signed)
History and Physical    Debbie Martinez [id number] DOB: Sep 04, 1975 DOA: 12/31/2015  Referring MD/NP/PA:   PCP: PROVIDER NOT IN SYSTEM   Patient coming from:  The patient is coming from home.  At baseline, pt is independent for most of ADL.   Chief Complaint: Severe back pain  HPI: Debbie Martinez is a 40 y.o. female with medical history significant of breast cancer (s/p of R lumpectomy), HTN, gerd and anxiety, who presents with severe back pain.  Pt states that she has been having back pain since June, which has been progressively getting worse. Currently the back pain is constant, 10 out of 10 severity, radiating to both legs posteriorly. She also has mild leg weakness and numbness on both sides. It is aggravated by movement. Pt denies any recent injury, fall, or MVC. She denies any loss of control over her bowel or bladder. She reports urinary frequency, but no dysuria or burning on urination. No fever, chills, nausea, vomiting, diarrhea, abdominal pain, chest pain, shortness of breath. No lesion change or hearing loss.  ED Course: pt was found to have WBC 8.2, temperature normal, tachycardia, electrolytes and renal function okay. MRI reveals innumerable metastases with an acute pathologic fracture at the L4 vertebral body with retropulsion and probable epidural tumor causing severe canal stenosis. Pt is admitted to med-surg bed. Neurosurgeon, Dr. Saintclair Halsted and oncologist, Dr. Alvy Bimler were consulted by EDP.  Review of Systems:   General: no fevers, chills, no changes in body weight, has poor appetite, has fatigue HEENT: no blurry vision, hearing changes or sore throat Respiratory: no dyspnea, coughing, wheezing CV: no chest pain, no palpitations GI: no nausea, vomiting, abdominal pain, diarrhea, constipation GU: no dysuria, burning on urination, has increased urinary frequency, no hematuria  Ext: no leg edema Neuro: no unilateral weakness, numbness, or tingling, no vision change or  hearing loss Skin: no rash, no skin tear. MSK: has sever back pain and both leg numbness and weakness.  Heme: No easy bruising.  Travel history: No recent long distant travel.  Allergy:  Allergies  Allergen Reactions  . Lisinopril Swelling and Cough    Swelling under eyes and eyelids    . Tamoxifen Itching    Past Medical History:  Diagnosis Date  . Anxiety    gets sweaty and short of breath  . Breast cancer ([place]) 01/20/14   triple positive  . GERD (gastroesophageal reflux disease)    does not take anything  . Gestational diabetes   . Hypertension   . Shortness of breath    'anxiety'    Past Surgical History:  Procedure Laterality Date  . BREAST LUMPECTOMY WITH NEEDLE LOCALIZATION AND AXILLARY SENTINEL LYMPH NODE BX Right 08/23/2014   Procedure: RIGHT BREAST LUMPECTOMY WITH NEEDLE LOCALIZATION(X'S 2)AND RIGHT AXILLARY SENTINEL LYMPH NODE Biopies;  Surgeon: Autumn Messing III, MD;  Location: [facility];  Service: General;  Laterality: Right;  . CESAREAN SECTION     2008  . KELOID EXCISION Left    ear  . PORTACATH PLACEMENT Left 01/26/2014   Procedure: INSERTION PORT-A-CATH;  Surgeon: Autumn Messing III, MD;  Location: [facility];  Service: General;  Laterality: Left;  . RE-EXCISION OF BREAST CANCER,SUPERIOR MARGINS Right 09/15/2014   Procedure: RE-EXCISION OF RIGHT BREAST CANCER,SUPERIOR AND INFERIOR MARGINS;  Surgeon: Autumn Messing III, MD;  Location: [facility];  Service: General;  Laterality: Right;    Social History:  reports that she has never smoked. She does not have any smokeless tobacco history  on file. She reports that she drinks alcohol. She reports that she does not use drugs.  Family History:  Family History  Problem Relation Age of Onset  . Heart attack Father 39  . Lupus Maternal Aunt   . Prostate cancer Maternal Grandfather 98  . Dementia Paternal Grandmother   . Leukemia Cousin 68    paternal first cousin     Prior to Admission medications   Medication Sig  Start Date End Date Taking? Authorizing Provider  atenolol (TENORMIN) 50 MG tablet Take 50 mg by mouth daily.    Historical Provider, MD  Cholecalciferol (VITAMIN D-3 PO) Take 1 capsule by mouth daily.    Historical Provider, MD  diazepam (VALIUM) 5 MG tablet Take 1 tablet (5 mg total) by mouth 2 (two) times daily. 10/06/15   Nona Dell, PA-C  ENSURE (ENSURE) Take 237 mLs by mouth 2 (two) times daily between meals.     Historical Provider, MD  ibuprofen (ADVIL,MOTRIN) 600 MG tablet Take 1 tablet (600 mg total) by mouth every 6 (six) hours as needed for moderate pain. 11/03/15   Jaime Pilcher Ward, PA-C  Iron-Vitamins (GERITOL) LIQD Take 5 mLs by mouth daily.    Historical Provider, MD  methocarbamol (ROBAXIN) 500 MG tablet Take 1 tablet (500 mg total) by mouth 2 (two) times daily as needed for muscle spasms. 11/03/15   Ozella Almond Ward, PA-C  oxyCODONE-acetaminophen (PERCOCET/ROXICET) 5-325 MG tablet Take 1 tablet by mouth every 6 (six) hours as needed for severe pain. 11/03/15   Ozella Almond Ward, PA-C  tamoxifen (NOLVADEX) 20 MG tablet Take 1 tablet (20 mg total) by mouth daily. Patient not taking: Reported on 11/03/2015 08/04/15   [name], MD    Physical Exam: Vitals:   12/31/15 2018 12/31/15 2203 12/31/15 2215 12/31/15 2230  BP: 140/77 138/95 134/89 142/98  Pulse: 79 103 98 86  Resp: 14 16    Temp: 98.6 F (37 C)     TempSrc: Oral     SpO2: 99% 99% 100% 100%   General:  in acute distress due to pain. HEENT:       Eyes: PERRL, EOMI, no scleral icterus.       ENT: No discharge from the ears and nose, no pharynx injection, no tonsillar enlargement.        Neck: No JVD, no bruit, no mass felt. Heme: No neck lymph node enlargement. Cardiac: S1/S2, RRR, No murmurs, No gallops or rubs. Respiratory: No rales, wheezing, rhonchi or rubs. GI: Soft, nondistended, nontender, no rebound pain, no organomegaly, BS present. GU: No hematuria Ext: No pitting leg edema bilaterally.  2+DP/PT pulse bilaterally. Musculoskeletal: has tenderness in the lower back midline. Skin: No rashes.  Neuro: Alert, oriented X3, cranial nerves II-XII grossly intact, Muscle strength 4/5 in both legs and 5/5 in both arms. sensation to light touch intact. Knee reflex 1+ bilaterally. Negative Babinski's sign.  Psych: Patient is not psychotic, no suicidal or hemocidal ideation.  Labs on Admission: I have personally reviewed following labs and imaging studies  CBC:  Recent Labs Lab 12/31/15 2150  WBC 8.2  NEUTROABS 5.4  HGB 10.9*  HCT 35.8*  MCV 83.6  PLT [id number]   Basic Metabolic Panel:  Recent Labs Lab 12/31/15 2150  NA 142  K 4.4  CL 104  CO2 30  GLUCOSE 99  BUN 10  CREATININE 0.77  CALCIUM 10.2   GFR: CrCl cannot be calculated (Unknown ideal weight.). Liver Function Tests:  Recent Labs Lab  12/31/15 2150  AST 25  ALT 24  ALKPHOS 107  BILITOT 0.5  PROT 8.1  ALBUMIN 3.4*   No results for input(s): LIPASE, AMYLASE in the last 168 hours. No results for input(s): AMMONIA in the last 168 hours. Coagulation Profile: No results for input(s): INR, PROTIME in the last 168 hours. Cardiac Enzymes: No results for input(s): CKTOTAL, CKMB, CKMBINDEX, TROPONINI in the last 168 hours. BNP (last 3 results) No results for input(s): PROBNP in the last 8760 hours. HbA1C: No results for input(s): HGBA1C in the last 72 hours. CBG: No results for input(s): GLUCAP in the last 168 hours. Lipid Profile: No results for input(s): CHOL, HDL, LDLCALC, TRIG, CHOLHDL, LDLDIRECT in the last 72 hours. Thyroid Function Tests: No results for input(s): TSH, T4TOTAL, FREET4, T3FREE, THYROIDAB in the last 72 hours. Anemia Panel: No results for input(s): VITAMINB12, FOLATE, FERRITIN, TIBC, IRON, RETICCTPCT in the last 72 hours. Urine analysis:    Component Value Date/Time   COLORURINE YELLOW 11/03/2015 0921   APPEARANCEUR CLOUDY (A) 11/03/2015 0921   LABSPEC 1.019 11/03/2015 0921    PHURINE 7.0 11/03/2015 0921   GLUCOSEU NEGATIVE 11/03/2015 0921   HGBUR SMALL (A) 11/03/2015 0921   BILIRUBINUR NEGATIVE 11/03/2015 0921   KETONESUR NEGATIVE 11/03/2015 0921   PROTEINUR NEGATIVE 11/03/2015 0921   NITRITE NEGATIVE 11/03/2015 0921   LEUKOCYTESUR MODERATE (A) 11/03/2015 0921   Sepsis Labs: @LABRCNTIP (procalcitonin:4,lacticidven:4) )No results found for this or any previous visit (from the past 240 hour(s)).   Radiological Exams on Admission: Mr Thoracic Spine Wo Contrast  Result Date: 12/31/2015 CLINICAL DATA:  History of breast cancer with unremitting back pain EXAM: MRI THORACIC AND LUMBAR SPINE WITHOUT CONTRAST TECHNIQUE: Multiplanar and multiecho pulse sequences of the thoracic and lumbar spine were obtained without intravenous contrast. COMPARISON:  None. FINDINGS: MRI THORACIC SPINE FINDINGS Alignment:  Physiologic. Vertebrae: Sparing T1 and T2 there are numerous T1 and T2 hypo intense rounded lesions throughout the thoracic bodies and posterior elements. Posterior cortex thinning/breakthrough right paracentral at T11, without neural impingement or measurable epidural mass. Anticipate postcontrast imaging. No visible effacement in the foramina. Cord:  Normal. Paraspinal and other soft tissues: No visible adenopathy or lung nodule. Disc levels: No notable degenerative changes. MRI LUMBAR SPINE FINDINGS Segmentation:  Standard. Alignment:  Reversal of lordosis due to compression deformity. Vertebrae: Numerous rounded metastases throughout the bodies and posterior elements of the lumbar spine and visualized sacrum. Large lesion infiltrating the L4 body with compression fracture that is acute based on lumbar spine radiography June 2017 and perispinal edema. Height loss measures 60-70% centrally. There is posterior bulging with possible epidural tumor causing high-grade canal stenosis. Postcontrast imaging would better define the extent of extraosseous tumor extension. No gross  foraminal impingement. No other compression fracture is noted. Conus medullaris: Extends to the T12/L1 disc level and appears normal. Paraspinal and other soft tissues: Paraspinal edema around the compression fracture Disc levels: L5-S1 left eccentric disc protrusion with left more than right S1 impingement in the subarticular recesses. IMPRESSION: 1. Innumerable osseous metastases with acute pathologic fracture the L4 vertebral body. 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 2. L5-S1 protrusion with left more than right S1 impingement. Electronically Signed   By: Monte Fantasia M.D.   On: 12/31/2015 20:32   Mr Lumbar Spine Wo Contrast  Result Date: 12/31/2015 CLINICAL DATA:  History of breast cancer with unremitting back pain EXAM: MRI THORACIC AND LUMBAR SPINE WITHOUT CONTRAST TECHNIQUE: Multiplanar and multiecho pulse sequences of the  thoracic and lumbar spine were obtained without intravenous contrast. COMPARISON:  None. FINDINGS: MRI THORACIC SPINE FINDINGS Alignment:  Physiologic. Vertebrae: Sparing T1 and T2 there are numerous T1 and T2 hypo intense rounded lesions throughout the thoracic bodies and posterior elements. Posterior cortex thinning/breakthrough right paracentral at T11, without neural impingement or measurable epidural mass. Anticipate postcontrast imaging. No visible effacement in the foramina. Cord:  Normal. Paraspinal and other soft tissues: No visible adenopathy or lung nodule. Disc levels: No notable degenerative changes. MRI LUMBAR SPINE FINDINGS Segmentation:  Standard. Alignment:  Reversal of lordosis due to compression deformity. Vertebrae: Numerous rounded metastases throughout the bodies and posterior elements of the lumbar spine and visualized sacrum. Large lesion infiltrating the L4 body with compression fracture that is acute based on lumbar spine radiography June 2017 and perispinal edema. Height loss measures 60-70% centrally. There is posterior  bulging with possible epidural tumor causing high-grade canal stenosis. Postcontrast imaging would better define the extent of extraosseous tumor extension. No gross foraminal impingement. No other compression fracture is noted. Conus medullaris: Extends to the T12/L1 disc level and appears normal. Paraspinal and other soft tissues: Paraspinal edema around the compression fracture Disc levels: L5-S1 left eccentric disc protrusion with left more than right S1 impingement in the subarticular recesses. IMPRESSION: 1. Innumerable osseous metastases with acute pathologic fracture the L4 vertebral body. 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 2. L5-S1 protrusion with left more than right S1 impingement. Electronically Signed   By: Monte Fantasia M.D.   On: 12/31/2015 20:32     EKG:  Not done in ED, will get one.   Assessment/Plan Principal Problem:   Back pain Active Problems:   Breast cancer of upper-inner quadrant of right female breast (HCC)   Metastatic breast cancer (HCC)   Essential hypertension   Anxiety   Back pain: this due to metastasized disease and pathologic L4 fracture. Neurosurgeon, Dr. Saintclair Halsted was consulted-->No surgery needed now. Oncologist, Dr. Alvy Bimler were consulted, will see pt in AM.  -will admit to med-surg bed as inpt -10 milligrams of Decadron was given in  ED, will continue 4 mg every 4 hour -prn Percocet and Dilaudid for pain -When necessary Robaxin for muscle spasm -f/u oncologist recommendation  Breast cancer of upper-inner quadrant of right female breast with metastasis: s/p of right lumpectomy. Patient has been followed up by Dr. Lindi Adie. Last seen was on 07/07/15. Pt used to be on tamoxifen which was discontinued due to allergic reaction. -Follow-up with Dr. Lindi Adie  HTN: Blood pressure was 138/95 -Continue atenolol  Anxiety:  -continue Valium   DVT ppx: SCD Code Status: Full code Family Communication: None at bed  side. Disposition Plan:  Anticipate discharge back to previous home environment Consults called:  Neurosurgeon, Dr. Saintclair Halsted; oncologist, Dr. Alvy Bimler Admission status: medical floor/inpt     Date of Service 12/31/2015    Ivor Costa [facility] Pager [phone]  If 7PM-7AM, please contact night-coverage [email or web address] Password [email or web address] 12/31/2015, 11:11 PM

## 2015-12-31 NOTE — ED Triage Notes (Addendum)
Pt c/o lower back pain since June. Pt states she cannot bend over due to pain. Pain radiates down left leg. Pt is not getting relief from prescription medications. Pt denies any recent injury or fall. Pt denies loose of bowel or bladder.

## 2015-12-31 NOTE — ED Provider Notes (Signed)
[facility] DEPT Provider Note   CSN: [id number] Arrival date & time: 12/31/15  1654   By signing my name below, I, Delton Prairie, attest that this documentation has been prepared under the direction and in the presence of  [employer], PA-C. Electronically Signed: Delton Prairie, ED Scribe. 12/31/15. 5:27 PM.   History   Chief Complaint Chief Complaint  Patient presents with  . Back Pain    The history is provided by the patient. No language interpreter was used.    HPI Comments:  Debbie Martinez is a 40 y.o. female, with a hx of breast CA, who presents to the Emergency Department complaining of worsening, moderate lower back pain since June. She notes the pain radiates down to her hips and left leg. She states the pain worsens when she bends over and during ambulation. Pt denies any recent injury, fall, or MVC. Pt notes associated diaphoresis and frequent urination. She denies any loss of control over her bowel or bladder. Pt has been in remission since 11/2014 from breast CA. Pt states she has taken prescription medication with little relief.   OncLindi Adie  Past Medical History:  Diagnosis Date  . Anxiety    gets sweaty and short of breath  . Breast cancer ([facility]) 01/20/14   triple positive  . GERD (gastroesophageal reflux disease)    does not take anything  . Gestational diabetes   . Hypertension   . Shortness of breath    'anxiety'    Patient Active Problem List   Diagnosis Date Noted  . Genetic testing 02/25/2015  . Chemotherapy induced nausea and vomiting 03/25/2014  . 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 Constipation 03/25/2014  . Breast cancer of upper-inner quadrant of right female breast ([facility]) 01/15/2014    Past Surgical History:  Procedure Laterality Date  . BREAST LUMPECTOMY WITH NEEDLE LOCALIZATION AND AXILLARY SENTINEL LYMPH NODE BX Right 08/23/2014   Procedure: RIGHT BREAST LUMPECTOMY WITH NEEDLE LOCALIZATION(X'S 2)AND RIGHT  AXILLARY SENTINEL LYMPH NODE Biopies;  Surgeon: Autumn Messing III, MD;  Location: [facility];  Service: General;  Laterality: Right;  . CESAREAN SECTION     2008  . KELOID EXCISION Left    ear  . PORTACATH PLACEMENT Left 01/26/2014   Procedure: INSERTION PORT-A-CATH;  Surgeon: Autumn Messing III, MD;  Location: [facility];  Service: General;  Laterality: Left;  . RE-EXCISION OF BREAST CANCER,SUPERIOR MARGINS Right 09/15/2014   Procedure: RE-EXCISION OF RIGHT BREAST CANCER,SUPERIOR AND INFERIOR MARGINS;  Surgeon: Autumn Messing III, MD;  Location: [facility];  Service: General;  Laterality: Right;    OB History    No data available       Home Medications    Prior to Admission medications   Medication Sig Start Date End Date Taking? Authorizing Provider  atenolol (TENORMIN) 50 MG tablet Take 50 mg by mouth daily.    Historical Provider, MD  Cholecalciferol (VITAMIN D-3 PO) Take 1 capsule by mouth daily.    Historical Provider, MD  diazepam (VALIUM) 5 MG tablet Take 1 tablet (5 mg total) by mouth 2 (two) times daily. 10/06/15   Nona Dell, PA-C  ENSURE (ENSURE) Take 237 mLs by mouth 2 (two) times daily between meals.     Historical Provider, MD  ibuprofen (ADVIL,MOTRIN) 600 MG tablet Take 1 tablet (600 mg total) by mouth every 6 (six) hours as needed for moderate pain. 11/03/15   Jaime Pilcher Ward, PA-C  Iron-Vitamins (GERITOL) LIQD Take 5 mLs by  mouth daily.    Historical Provider, MD  methocarbamol (ROBAXIN) 500 MG tablet Take 1 tablet (500 mg total) by mouth 2 (two) times daily as needed for muscle spasms. 11/03/15   Ozella Almond Ward, PA-C  oxyCODONE-acetaminophen (PERCOCET/ROXICET) 5-325 MG tablet Take 1 tablet by mouth every 6 (six) hours as needed for severe pain. 11/03/15   Ozella Almond Ward, PA-C  tamoxifen (NOLVADEX) 20 MG tablet Take 1 tablet (20 mg total) by mouth daily. Patient not taking: Reported on 11/03/2015 08/04/15   [name], MD    Family History Family History    Problem Relation Age of Onset  . Heart attack Father 17  . Lupus Maternal Aunt   . Prostate cancer Maternal Grandfather 69  . Dementia Paternal Grandmother   . Leukemia Cousin 67    paternal first cousin    Social History Social History  Substance Use Topics  . Smoking status: Never Smoker  . Smokeless tobacco: Not on file  . Alcohol use Yes     Comment: Rarely- maybe  every 6 months     Allergies   Lisinopril and Tamoxifen   Review of Systems Review of Systems  10 systems reviewed and all are negative for acute change except as noted in the HPI.   Physical Exam Updated Vital Signs BP 148/92 (BP Location: Left Arm)   Pulse 110   Temp 98.5 F (36.9 C) (Oral)   Resp 20   LMP 12/23/2015   SpO2 99%   Physical Exam  Constitutional: She is oriented to person, place, and time. She appears well-developed and well-nourished. No distress.  HENT:  Head: Normocephalic and atraumatic.  Mouth/Throat: Oropharynx is clear and moist.  Eyes: Conjunctivae and EOM are normal. Pupils are equal, round, and reactive to light.  Neck: Normal range of motion.  Cardiovascular: Normal rate, regular rhythm and intact distal pulses.   Pulmonary/Chest: Effort normal and breath sounds normal.  Abdominal: Soft. There is no tenderness.  Musculoskeletal: Normal range of motion.  Neurological: She is alert and oriented to person, place, and time.  No point tenderness to percussion of lumbar spinal processes.  No TTP or paraspinal muscular spasm. Strength is 5 out of 5 to bilateral lower extremities at hip and knee; extensor hallucis longus 5 out of 5. Ankle strength 5 out of 5, no clonus, neurovascularly intact. No saddle anaesthesia. Patellar reflexes are reduced bilaterally   Skin: She is not diaphoretic.  Psychiatric: She has a normal mood and affect.  Nursing note and vitals reviewed.    ED Treatments / Results  DIAGNOSTIC STUDIES:  Oxygen Saturation is 99% on RA, normal by my  interpretation.    COORDINATION OF CARE:  5:21 PM Discussed treatment plan with pt at bedside and pt agreed to plan.  Labs (all labs ordered are listed, but only abnormal results are displayed) Labs Reviewed - No data to display  EKG  EKG Interpretation None       Radiology No results found.  Procedures Procedures (including critical care time)  Medications Ordered in ED Medications - No data to display   Initial Impression / Assessment and Plan / ED Course  I have reviewed the triage vital signs and the nursing notes.  Pertinent labs & imaging results that were available during my care of the patient were reviewed by me and considered in my medical decision making (see chart for details).  Clinical Course    Vitals:   12/31/15 1703 12/31/15 2018  BP: 148/92 140/77  Pulse: 110 79  Resp: 20 14  Temp: 98.5 F (36.9 C) 98.6 F (37 C)  TempSrc: Oral Oral  SpO2: 99% 99%    Medications  HYDROmorphone (DILAUDID) injection 1 mg (1 mg Intramuscular Given 12/31/15 1800)    Debbie Martinez is 40 y.o. female presenting with Severe low back pain worsening over the course of several months, neurologic exam significant for deep crease deep tendon reflexes to bilateral patellas. Rate of breast cancer in remission, severe and progressively worsening pain, unfortunately, this patient has not been able to obtain a visit with her primary care doctor she is called multiple times and shown up to the office and was not able to secure an appointment, will obtain MRI to further evaluate her severe pain.  Pateint reports improvement after IM narcotics.  Unfortunately, MRI reveals innumerable metastases with an acute pathologic fracture at the L4 vertebral body with retropulsion and probable epidural tumor causing severe canal stenosis.  Neurosurgery consult from Dr. Saintclair Halsted appreciated: Based on the lack of focal macro findings he would not take this patient to the operating room right  at this moment, patient will likely need admission for staging, he is invited hospitalist to page him as needed.  Oncology consult from Dr. Alvy Bimler to appreciated, she recommends at this patient is transferred to [facility] for further oncology evaluation. Recommends obtaining basic labs to evaluate for hypercalcemia and initiating Decadron 4 mg every 6 hours. Oncology will round on her tomorrow morning.  Updated patient on findings and plan. I have called a friend to be with her, she is extremely upset, will have chaplain come to speak with her. I have updated the family friend will come to [facility] and can be present with her.    Case discussed with triad hospitalist Dr. new who accepts admission, he will communicate with Dr. Lorin Mercy at [facility], EMTALA completed.   Final Clinical Impressions(s) / ED Diagnoses   Final diagnoses:  Pain  Metastatic breast cancer ([place])  Fracture of L4 vertebra, closed, initial encounter  Spinal stenosis, unspecified spinal region    New Prescriptions New Prescriptions   No medications on file   I personally performed the services described in this documentation, which was scribed in my presence. The recorded information has been reviewed and is accurate.     Elmyra Ricks Myrtha Tonkovich, PA-C 01/01/16 [id number]    Pattricia Boss, MD 01/01/16 [phone]

## 2016-01-01 ENCOUNTER — Inpatient Hospital Stay (HOSPITAL_COMMUNITY): Payer: Medicaid Other

## 2016-01-01 ENCOUNTER — Encounter (HOSPITAL_COMMUNITY): Payer: Self-pay | Admitting: Emergency Medicine

## 2016-01-01 DIAGNOSIS — C7931 Secondary malignant neoplasm of brain: Secondary | ICD-10-CM

## 2016-01-01 DIAGNOSIS — S32049A Unspecified fracture of fourth lumbar vertebra, initial encounter for closed fracture: Secondary | ICD-10-CM

## 2016-01-01 DIAGNOSIS — K5909 Other constipation: Secondary | ICD-10-CM

## 2016-01-01 DIAGNOSIS — M549 Dorsalgia, unspecified: Secondary | ICD-10-CM

## 2016-01-01 LAB — URINALYSIS, ROUTINE W REFLEX MICROSCOPIC
Bilirubin Urine: NEGATIVE
GLUCOSE, UA: NEGATIVE mg/dL
HGB URINE DIPSTICK: NEGATIVE
Ketones, ur: NEGATIVE mg/dL
Nitrite: NEGATIVE
Protein, ur: NEGATIVE mg/dL
SPECIFIC GRAVITY, URINE: 1.022 (ref 1.005–1.030)
pH: 7 (ref 5.0–8.0)

## 2016-01-01 LAB — URINE MICROSCOPIC-ADD ON

## 2016-01-01 LAB — HCG, QUANTITATIVE, PREGNANCY: HCG, BETA CHAIN, QUANT, S: 4 m[IU]/mL (ref <5)

## 2016-01-01 LAB — GLUCOSE, CAPILLARY: GLUCOSE-CAPILLARY: 221 mg/dL — AB (ref 65–99)

## 2016-01-01 LAB — PROTIME-INR
INR: 1.1
Prothrombin Time: 14.2 seconds (ref 11.4–15.2)

## 2016-01-01 MED ORDER — POLYETHYLENE GLYCOL 3350 17 G PO PACK
17.0000 g | PACK | Freq: Every day | ORAL | Status: DC
  Administered 2016-01-01 – 2016-01-06 (×6): 17 g via ORAL
  Filled 2016-01-01 (×6): qty 1

## 2016-01-01 MED ORDER — IOPAMIDOL (ISOVUE-300) INJECTION 61%
100.0000 mL | Freq: Once | INTRAVENOUS | Status: AC | PRN
  Administered 2016-01-01: 100 mL via INTRAVENOUS

## 2016-01-01 MED ORDER — SENNOSIDES-DOCUSATE SODIUM 8.6-50 MG PO TABS
1.0000 | ORAL_TABLET | Freq: Two times a day (BID) | ORAL | Status: DC
  Administered 2016-01-01 – 2016-01-03 (×6): 1 via ORAL
  Filled 2016-01-01 (×6): qty 1

## 2016-01-01 MED ORDER — DEXAMETHASONE 4 MG PO TABS
4.0000 mg | ORAL_TABLET | Freq: Four times a day (QID) | ORAL | Status: DC
  Administered 2016-01-01 – 2016-01-06 (×21): 4 mg via ORAL
  Filled 2016-01-01 (×22): qty 1

## 2016-01-01 MED ORDER — IOPAMIDOL (ISOVUE-300) INJECTION 61%: 30.0000 mL | Freq: Once | INTRAVENOUS | Status: DC | PRN

## 2016-01-01 MED ORDER — LIDOCAINE-PRILOCAINE 2.5-2.5 % EX CREA
TOPICAL_CREAM | CUTANEOUS | Status: DC | PRN
  Administered 2016-01-02: 19:00:00 via TOPICAL
  Filled 2016-01-01: qty 5

## 2016-01-01 MED ORDER — ADULT MULTIVITAMIN LIQUID CH
15.0000 mL | Freq: Every day | ORAL | Status: DC
  Administered 2016-01-01 – 2016-01-06 (×6): 15 mL via ORAL
  Filled 2016-01-01 (×7): qty 15

## 2016-01-01 NOTE — Consult Note (Signed)
Reason for Consult: Back pain Referring Physician: [facility]  Debbie Martinez is an 40 y.o. female.  HPI: Patient is a very pleasant 40 year old female is cut known history of breast cancer that is unstable most recent evaluation was packed this past March over the last couple months is a progress worsening back pain. She has occasional pain will radiate down the backs of her legs to her knees not usually below her knees and no involvement the feet. She has had episodes where her left leg gave way on her. Pain is predominantly in her low back occasionally in her tailbone she feels like she can't bend over. She denies any new problems with bowel or bladder dysfunction. She does have some itching that was initially associated with her tamoxifen treatment and she has some frequency but she denies any retention and she denies any changes over the last 1-2 months.  Past Medical History:  Diagnosis Date  . Anxiety    gets sweaty and short of breath  . Breast cancer ([place]) 01/20/14   triple positive  . GERD (gastroesophageal reflux disease)    does not take anything  . Gestational diabetes   . Hypertension   . Shortness of breath    'anxiety'    Past Surgical History:  Procedure Laterality Date  . BREAST LUMPECTOMY WITH NEEDLE LOCALIZATION AND AXILLARY SENTINEL LYMPH NODE BX Right 08/23/2014   Procedure: RIGHT BREAST LUMPECTOMY WITH NEEDLE LOCALIZATION(X'S 2)AND RIGHT AXILLARY SENTINEL LYMPH NODE Biopies;  Surgeon: Autumn Messing III, MD;  Location: [facility];  Service: General;  Laterality: Right;  . CESAREAN SECTION     2008  . KELOID EXCISION Left    ear  . PORTACATH PLACEMENT Left 01/26/2014   Procedure: INSERTION PORT-A-CATH;  Surgeon: Autumn Messing III, MD;  Location: [facility];  Service: General;  Laterality: Left;  . RE-EXCISION OF BREAST CANCER,SUPERIOR MARGINS Right 09/15/2014   Procedure: RE-EXCISION OF RIGHT BREAST CANCER,SUPERIOR AND INFERIOR MARGINS;  Surgeon: Autumn Messing III, MD;   Location: [facility];  Service: General;  Laterality: Right;    Family History  Problem Relation Age of Onset  . Heart attack Father 38  . Lupus Maternal Aunt   . Prostate cancer Maternal Grandfather 25  . Dementia Paternal Grandmother   . Leukemia Cousin 83    paternal first cousin    Social History:  reports that she has never smoked. She does not have any smokeless tobacco history on file. She reports that she drinks alcohol. She reports that she does not use drugs.  Allergies:  Allergies  Allergen Reactions  . Lisinopril Swelling and Cough    Swelling under eyes and eyelids    . Tamoxifen Itching    Medications: I have reviewed the patient's current medications.  Results for orders placed or performed during the hospital encounter of 12/31/15 (from the past 48 hour(s))  hCG, quantitative, pregnancy     Status: None   Collection Time: 12/31/15 12:09 AM  Result Value Ref Range   hCG, Beta Chain, Quant, S 4 <5 mIU/mL    Comment:          GEST. AGE      CONC.  (mIU/mL)   <=1 WEEK        5 - 50     2 WEEKS       50 - 500     3 WEEKS       100 - 10,000     4 WEEKS  1,000 - 30,000     5 WEEKS     3,500 - 115,000   6-8 WEEKS     12,000 - 270,000    12 WEEKS     15,000 - 220,000        FEMALE AND NON-PREGNANT FEMALE:     LESS THAN 5 mIU/mL   Protime-INR     Status: None   Collection Time: 12/31/15 12:09 AM  Result Value Ref Range   Prothrombin Time 14.2 11.4 - 15.2 seconds   INR 1.10   Urinalysis, Routine w reflex microscopic (not at [facility])     Status: Abnormal   Collection Time: 12/31/15 12:20 AM  Result Value Ref Range   Color, Urine YELLOW YELLOW   APPearance TURBID (A) CLEAR   Specific Gravity, Urine 1.022 1.005 - 1.030   pH 7.0 5.0 - 8.0   Glucose, UA NEGATIVE NEGATIVE mg/dL   Hgb urine dipstick NEGATIVE NEGATIVE   Bilirubin Urine NEGATIVE NEGATIVE   Ketones, ur NEGATIVE NEGATIVE mg/dL   Protein, ur NEGATIVE NEGATIVE mg/dL   Nitrite NEGATIVE  NEGATIVE   Leukocytes, UA SMALL (A) NEGATIVE  Urine microscopic-add on     Status: Abnormal   Collection Time: 12/31/15 12:20 AM  Result Value Ref Range   Squamous Epithelial / LPF 0-5 (A) NONE SEEN   WBC, UA 6-30 0 - 5 WBC/hpf   RBC / HPF 0-5 0 - 5 RBC/hpf   Bacteria, UA FEW (A) NONE SEEN   Casts HYALINE CASTS (A) NEGATIVE   Urine-Other AMORPHOUS URATES/PHOSPHATES   CBC with Differential     Status: Abnormal   Collection Time: 12/31/15  9:50 PM  Result Value Ref Range   WBC 8.2 4.0 - 10.5 K/uL   RBC 4.28 3.87 - 5.11 MIL/uL   Hemoglobin 10.9 (L) 12.0 - 15.0 g/dL   HCT 58.2 (L) 62.6 - 34.7 %   MCV 83.6 78.0 - 100.0 fL   MCH 25.5 (L) 26.0 - 34.0 pg   MCHC 30.4 30.0 - 36.0 g/dL   RDW 30.7 47.6 - 27.7 %   Platelets 325 150 - 400 K/uL   Neutrophils Relative % 66 %   Neutro Abs 5.4 1.7 - 7.7 K/uL   Lymphocytes Relative 24 %   Lymphs Abs 2.0 0.7 - 4.0 K/uL   Monocytes Relative 9 %   Monocytes Absolute 0.7 0.1 - 1.0 K/uL   Eosinophils Relative 1 %   Eosinophils Absolute 0.1 0.0 - 0.7 K/uL   Basophils Relative 0 %   Basophils Absolute 0.0 0.0 - 0.1 K/uL  Comprehensive metabolic panel     Status: Abnormal   Collection Time: 12/31/15  9:50 PM  Result Value Ref Range   Sodium 142 135 - 145 mmol/L   Potassium 4.4 3.5 - 5.1 mmol/L   Chloride 104 101 - 111 mmol/L   CO2 30 22 - 32 mmol/L   Glucose, Bld 99 65 - 99 mg/dL   BUN 10 6 - 20 mg/dL   Creatinine, Ser 2.89 0.44 - 1.00 mg/dL   Calcium 32.9 8.9 - 11.5 mg/dL   Total Protein 8.1 6.5 - 8.1 g/dL   Albumin 3.4 (L) 3.5 - 5.0 g/dL   AST 25 15 - 41 U/L   ALT 24 14 - 54 U/L   Alkaline Phosphatase 107 38 - 126 U/L   Total Bilirubin 0.5 0.3 - 1.2 mg/dL   GFR calc non Af Amer >60 >60 mL/min   GFR calc Af Amer >60 >60  mL/min    Comment: (NOTE) The eGFR has been calculated using the CKD EPI equation. This calculation has not been validated in all clinical situations. eGFR's persistently <60 mL/min signify possible Chronic  Kidney Disease.    Anion gap 8 5 - 15  Glucose, capillary     Status: Abnormal   Collection Time: 01/01/16  8:16 AM  Result Value Ref Range   Glucose-Capillary 221 (H) 65 - 99 mg/dL    Mr Thoracic Spine Wo Contrast  Result Date: 12/31/2015 CLINICAL DATA:  History of breast cancer with unremitting back pain EXAM: MRI THORACIC AND LUMBAR SPINE WITHOUT CONTRAST TECHNIQUE: Multiplanar and multiecho pulse sequences of the thoracic and lumbar spine were obtained without intravenous contrast. COMPARISON:  None. FINDINGS: MRI THORACIC SPINE FINDINGS Alignment:  Physiologic. Vertebrae: Sparing T1 and T2 there are numerous T1 and T2 hypo intense rounded lesions throughout the thoracic bodies and posterior elements. Posterior cortex thinning/breakthrough right paracentral at T11, without neural impingement or measurable epidural mass. Anticipate postcontrast imaging. No visible effacement in the foramina. Cord:  Normal. Paraspinal and other soft tissues: No visible adenopathy or lung nodule. Disc levels: No notable degenerative changes. MRI LUMBAR SPINE FINDINGS Segmentation:  Standard. Alignment:  Reversal of lordosis due to compression deformity. Vertebrae: Numerous rounded metastases throughout the bodies and posterior elements of the lumbar spine and visualized sacrum. Large lesion infiltrating the L4 body with compression fracture that is acute based on lumbar spine radiography June 2017 and perispinal edema. Height loss measures 60-70% centrally. There is posterior bulging with possible epidural tumor causing high-grade canal stenosis. Postcontrast imaging would better define the extent of extraosseous tumor extension. No gross foraminal impingement. No other compression fracture is noted. Conus medullaris: Extends to the T12/L1 disc level and appears normal. Paraspinal and other soft tissues: Paraspinal edema around the compression fracture Disc levels: L5-S1 left eccentric disc protrusion with left more than  right S1 impingement in the subarticular recesses. IMPRESSION: 1. Innumerable osseous metastases with acute pathologic fracture the L4 vertebral body. 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 2. L5-S1 protrusion with left more than right S1 impingement. Electronically Signed   By: Monte Fantasia M.D.   On: 12/31/2015 20:32   Mr Lumbar Spine Wo Contrast  Result Date: 12/31/2015 CLINICAL DATA:  History of breast cancer with unremitting back pain EXAM: MRI THORACIC AND LUMBAR SPINE WITHOUT CONTRAST TECHNIQUE: Multiplanar and multiecho pulse sequences of the thoracic and lumbar spine were obtained without intravenous contrast. COMPARISON:  None. FINDINGS: MRI THORACIC SPINE FINDINGS Alignment:  Physiologic. Vertebrae: Sparing T1 and T2 there are numerous T1 and T2 hypo intense rounded lesions throughout the thoracic bodies and posterior elements. Posterior cortex thinning/breakthrough right paracentral at T11, without neural impingement or measurable epidural mass. Anticipate postcontrast imaging. No visible effacement in the foramina. Cord:  Normal. Paraspinal and other soft tissues: No visible adenopathy or lung nodule. Disc levels: No notable degenerative changes. MRI LUMBAR SPINE FINDINGS Segmentation:  Standard. Alignment:  Reversal of lordosis due to compression deformity. Vertebrae: Numerous rounded metastases throughout the bodies and posterior elements of the lumbar spine and visualized sacrum. Large lesion infiltrating the L4 body with compression fracture that is acute based on lumbar spine radiography June 2017 and perispinal edema. Height loss measures 60-70% centrally. There is posterior bulging with possible epidural tumor causing high-grade canal stenosis. Postcontrast imaging would better define the extent of extraosseous tumor extension. No gross foraminal impingement. No other compression fracture is noted. Conus medullaris: Extends to  the T12/L1 disc level and  appears normal. Paraspinal and other soft tissues: Paraspinal edema around the compression fracture Disc levels: L5-S1 left eccentric disc protrusion with left more than right S1 impingement in the subarticular recesses. IMPRESSION: 1. Innumerable osseous metastases with acute pathologic fracture the L4 vertebral body. 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 2. L5-S1 protrusion with left more than right S1 impingement. Electronically Signed   By: Monte Fantasia M.D.   On: 12/31/2015 20:32    Review of Systems  Constitutional: Negative.   Respiratory: Negative.   Cardiovascular: Negative.   Gastrointestinal: Negative.   Genitourinary: Negative.   Musculoskeletal: Positive for back pain, joint pain and myalgias.  Skin: Positive for itching.  Neurological: Positive for dizziness and tingling.   Blood pressure (!) 151/92, pulse 79, temperature 98 F (36.7 C), temperature source Oral, resp. rate 17, height '5\' 7"'$  (1.702 m), weight 78.8 kg (173 lb 11.6 oz), last menstrual period 12/23/2015, SpO2 98 %. Physical Exam  Constitutional: She is oriented to person, place, and time. She appears well-developed and well-nourished.  HENT:  Head: Normocephalic.  Eyes: Pupils are equal, round, and reactive to light.  Neck: Normal range of motion.  Respiratory: Effort normal.  GI: Soft.  Neurological: She is alert and oriented to person, place, and time. She has normal strength. GCS eye subscore is 4. GCS verbal subscore is 5. GCS motor subscore is 6.  Patient is awake and alert strength appears to be somewhat pain limited but if he isolate muscle groups she is 5 out of 5 in her iliopsoas, quads, hamstrings, gastroc, ant tibialis, and EHL. 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 Patient appears to have a normal motor exam and does not have a cauda equina syndrome. Surgery for this would require decompression and stabilization procedure with at least a partial corpectomy of L4 with pedicle fixation above and below. She has what appears to be tumor involvement in the neighboring vertebra so her chances of failure of fusion are significant. She has never had radiation to her spine in the setting of her normal motor exam I would favor radiation treatment first pending evaluation by oncology. We can certainly continue to follow onto her progress and do surgery to decompress her spine at any point in the future. I am worried however by decompression alone as it would further destabilize her spine potentially extend the pathologic fracture and and up with more back pain. So prior to entertaining any surgical correction which again would more than likely be both decompression stabilization, we need to have an idea about her prognosis and life expectancy. Patient does appear to be better on steroids overnight with less pain. I would recommend a lumbar corset to help with her back pain. We could also consider a TLSO however certainly that'll be much more uncomfortable for if the corset is strong enough to help control her pain.  Dierre Crevier P 01/01/2016, 11:13 AM

## 2016-01-01 NOTE — ED Notes (Signed)
Patient transported to [facility] via [place].

## 2016-01-01 NOTE — Consult Note (Signed)
[facility] CONSULT NOTE  Patient Care Team: Provider Not In System as PCP - Palmyra III, MD as Consulting Physician (General Surgery) [name], MD as Consulting Physician (Hematology and Oncology) [name], MD as Attending Physician (Radiation Oncology) Sylvan Cheese, NP as Nurse Practitioner (Hematology and Oncology)  CHIEF COMPLAINTS/PURPOSE OF CONSULTATION:  Newly diagnosed metastatic cancer to the brain, on background history of breast cancer  HISTORY OF PRESENTING ILLNESS:  Debbie Martinez 40 y.o. female was admitted to the hospital through the emergency department yesterday after presentation with severe back pain. According to the patient, she has been having chronic lower back pain for 3 months but is getting progressively worse. Her pain was rated at 10 out of 10 pain last night. Today, she has 0 pain. She was started on high-dose IV dexamethasone along with narcotic prescription pain medicine She recalled lifting heavy object and felt that her bone cracked several months ago. That was managed with conservative management. Recently, she presented to urgent care and was prescribed prednisone, tramadol and steroid injection to her spine She described mild sensation of fullness in her back radiating down to both legs. She has mild weakness prior to admission but today she felt stronger. She denies changes in the sensation. She felt that her muscle has been undergoing some spasm lately and she was prescribed muscle relaxant for this She had change in appetite and has lost some weight due to uncontrolled pain and poor mobility She was almost completely bedbound right admission due to severe pain She had constipation for 6 days prior to admission. She denies urination difficulties She denies headaches or focal neurological deficits upon from what is described above She has occasional cough. I review her records related to her history of breast  cancer as follows:   Breast cancer of upper-inner quadrant of right female breast ([place])   01/12/2014 Mammogram    Right breast - two lesions: #1 2:00 position 1.6 x 1.9 cm and #2 12:00 position 0.5 x 0.9 cm. Distance between the 2 was 4.5 cm, right axillary lymph node enlargement      01/12/2014 Initial Biopsy    Right breast 3 biopsies: All were IDC with DCIS ER+ PR + Ki-67 85% HER-2 negative: Right axillary lymph node also positive for metastatic cancer ER positive HER-2 negative Ki-67 80% grade 2      01/18/2014 Breast MRI    Right breast 12:00 position 2.8 x 2 x 2.2 cm: 2:00 position 1.3 x 0.8 x 0.5 cm, right axilla multiple enlarged lymph nodes 1 cm in size, right retropectoral lymph node 0.6 cm      01/18/2014 Clinical Stage    Stage IIB T2 N1      01/25/2014 Procedure    Breast/Ovarian ([facility]) reveals no clinically significant variant at ATM, BARD1, BRCA1, BRCA2, BRIP1, CDH1, CHEK2, EPCAM, FANCC, MLH1, MSH2, MSH6, NBN, PALB2, PMS2, PTEN, RAD51C, RAD51D, TP53, and XRCC2.       02/11/2014 -  Neo-Adjuvant Chemotherapy    Doxorubicin and cyclophosphamide X 4 followed by Taxol weekly x12       07/12/2014 Breast MRI    Partial response to neoadjuvant chemotherapy right breast mass decreased from 2.8 cm to 2.3 cm, 2 small masses along the medial margin also decreased; significant decrease right breast mass 11 to 12:00 and 13 mm to 8 mm, decrease in right axilla LN      08/23/2014 Definitive Surgery    Right lumpectomy/SLNB [name]) 2:00: IDC  grade 3, 3.2 cm, high-grade DCIS, LV I present, PNI present,; 12:00: IDC grade 30.8 cm, high-grade DCIS, LV I, 4/4 lymph nodes positive with ECE, ER 90%, PR 40%, HER-2 negative margin positive      08/23/2014 Pathologic Stage    Stage IIIA: ypT2 ypN2a      09/15/2014 Surgery    Reexcision of margins: clear; no evidence of malignancy      11/03/2014 - 12/14/2014 Radiation Therapy    Adjuvant XRT Isidore Moos): Right Breast, supraclavicular nodes, axillary  nodes, and internal mammary nodes: 50 Gy over 25 fractions; right breast boost: 10 Gy over 5 fractions. Total dose: 60 Gy      12/29/2014 -  Anti-estrogen oral therapy    Tamoxifen 20 mg daily      04/08/2015 Survivorship    Survivorship care plan completed and mailed to patient in lieu of in person visit at her request      She has been taking tamoxifen at home. She is still menstruating regularly.  MEDICAL HISTORY:  Past Medical History:  Diagnosis Date  . Anxiety    gets sweaty and short of breath  . Breast cancer ([place]) 01/20/14   triple positive  . GERD (gastroesophageal reflux disease)    does not take anything  . Gestational diabetes   . Hypertension   . Shortness of breath    'anxiety'    SURGICAL HISTORY: Past Surgical History:  Procedure Laterality Date  . BREAST LUMPECTOMY WITH NEEDLE LOCALIZATION AND AXILLARY SENTINEL LYMPH NODE BX Right 08/23/2014   Procedure: RIGHT BREAST LUMPECTOMY WITH NEEDLE LOCALIZATION(X'S 2)AND RIGHT AXILLARY SENTINEL LYMPH NODE Bi[facility]ies;  Surgeon: Autumn Messing III, MD;  Location: [facility];  Service: General;  Laterality: Right;  . CESAREAN SECTION     2008  . KELOID EXCISION Left    ear  . PORTACATH PLACEMENT Left 01/26/2014   Procedure: INSERTION PORT-A-CATH;  Surgeon: Autumn Messing III, MD;  Location: [facility];  Service: General;  Laterality: Left;  . RE-EXCISION OF BREAST CANCER,SUPERIOR MARGINS Right 09/15/2014   Procedure: RE-EXCISION OF RIGHT BREAST CANCER,SUPERIOR AND INFERIOR MARGINS;  Surgeon: Autumn Messing III, MD;  Location: [facility];  Service: General;  Laterality: Right;    SOCIAL HISTORY: Social History   Social History  . Marital status: Single    Spouse name: N/A  . Number of children: N/A  . Years of education: N/A   Occupational History  . Not on file.   Social History Main Topics  . Smoking status: Never Smoker  . Smokeless tobacco: Not on file  . Alcohol use Yes     Comment: Rarely- maybe  every 6 months  .  Drug use: No  . Sexual activity: No   Other Topics Concern  . Not on file   Social History Narrative  . No narrative on file    FAMILY HISTORY: Family History  Problem Relation Age of Onset  . Heart attack Father 19  . Lupus Maternal Aunt   . Prostate cancer Maternal Grandfather 77  . Dementia Paternal Grandmother   . Leukemia Cousin 40    paternal first cousin    ALLERGIES:  is allergic to lisinopril and tamoxifen.  MEDICATIONS:  Current Facility-Administered Medications  Medication Dose Route Frequency Provider Last Rate Last Dose  . 0.9 %  sodium chloride infusion   Intravenous Continuous Ivor Costa, MD 75 mL/hr at 01/01/16 0124    . acetaminophen (TYLENOL) tablet 650 mg  650 mg Oral Q6H PRN Soledad Gerlach  Blaine Hamper, MD       Or  . acetaminophen (TYLENOL) suppository 650 mg  650 mg Rectal Q6H PRN Ivor Costa, MD      . atenolol (TENORMIN) tablet 50 mg  50 mg Oral Daily Ivor Costa, MD   50 mg at 01/01/16 1029  . cholecalciferol (VITAMIN D) tablet 1,000 Units  1,000 Units Oral Daily Ivor Costa, MD   1,000 Units at 01/01/16 1029  . dexamethasone (DECADRON) tablet 4 mg  4 mg Oral Q6H Deann Mclaine, MD      . diazepam (VALIUM) tablet 5 mg  5 mg Oral BID PRN Ivor Costa, MD      . HYDROmorphone (DILAUDID) injection 1 mg  1 mg Intravenous Q4H PRN Ivor Costa, MD   1 mg at 01/01/16 0524  . lidocaine-prilocaine (EMLA) cream   Topical PRN Heath Lark, MD      . methocarbamol (ROBAXIN) tablet 1,000 mg  1,000 mg Oral QID PRN Ivor Costa, MD      . multivitamin liquid 15 mL  15 mL Oral Daily Ivor Costa, MD   15 mL at 01/01/16 1029  . ondansetron (ZOFRAN-ODT) disintegrating tablet 8 mg  8 mg Oral Q8H PRN Ivor Costa, MD      . oxyCODONE-acetaminophen (PERCOCET/ROXICET) 5-325 MG per tablet 1 tablet  1 tablet Oral Q4H PRN Ivor Costa, MD      . polyethylene glycol (MIRALAX / GLYCOLAX) packet 17 g  17 g Oral Daily Heath Lark, MD      . senna-docusate (Senokot-S) tablet 1 tablet  1 tablet Oral BID Heath Lark, MD         REVIEW OF SYSTEMS:   Constitutional: Denies fevers, chills or abnormal night sweats Eyes: Denies blurriness of vision, double vision or watery eyes Ears, nose, mouth, throat, and face: Denies mucositis or sore throat Cardiovascular: Denies palpitation, chest discomfort or lower extremity swelling Skin: Denies abnormal skin rashes Lymphatics: Denies new lymphadenopathy or easy bruising Behavioral/Psych: Mood is stable, no new changes  All other systems were reviewed with the patient and are negative.  PHYSICAL EXAMINATION: ECOG PERFORMANCE STATUS: 2 - Symptomatic, <50% confined to bed  Vitals:   01/01/16 0059 01/01/16 0528  BP: 139/80 (!) 151/92  Pulse: 80 79  Resp: 19 17  Temp: 98.4 F (36.9 C) 98 F (36.7 C)   Filed Weights   01/01/16 0059 01/01/16 0528  Weight: 173 lb 11.6 oz (78.8 kg) 173 lb 11.6 oz (78.8 kg)    GENERAL:alert, no distress and comfortable. She is very pleasant SKIN: skin color, texture, turgor are normal, no rashes or significant lesions EYES: normal, conjunctiva are pink and non-injected, sclera clear OROPHARYNX:no exudate, no erythema and lips, buccal mucosa, and tongue normal  NECK: supple, thyroid normal size, non-tender, without nodularity LYMPH:  no palpable lymphadenopathy in the cervical, axillary or inguinal LUNGS: clear to auscultation and percussion with normal breathing effort HEART: regular rate & rhythm and no murmurs and no lower extremity edema ABDOMEN:abdomen soft, non-tender and normal bowel sounds. Palpable liver edge Musculoskeletal:no cyanosis of digits and no clubbing . The port site looks clean PSYCH: alert & oriented x 3 with fluent speech NEURO: no focal motor/sensory deficits. She has very mild weakness over the left thigh. Her gait was assessed and normal. Examination is limited by pain Bilateral breast examination were performed. She has skin thickening over the lumpectomy site on the right breast LABORATORY DATA:  I have  reviewed the data as listed Lab Results  Component  Value Date   WBC 8.2 12/31/2015   HGB 10.9 (L) 12/31/2015   HCT 35.8 (L) 12/31/2015   MCV 83.6 12/31/2015   PLT 325 12/31/2015    Recent Labs  12/31/15 2150  NA 142  K 4.4  CL 104  CO2 30  GLUCOSE 99  BUN 10  CREATININE 0.77  CALCIUM 10.2  GFRNONAA >60  GFRAA >60  PROT 8.1  ALBUMIN 3.4*  AST 25  ALT 24  ALKPHOS 107  BILITOT 0.5    RADIOGRAPHIC STUDIES:I reviewed imaging studies with the patient I have personally reviewed the radiological images as listed and agreed with the findings in the report. Mr Thoracic Spine Wo Contrast  Result Date: 12/31/2015 CLINICAL DATA:  History of breast cancer with unremitting back pain EXAM: MRI THORACIC AND LUMBAR SPINE WITHOUT CONTRAST TECHNIQUE: Multiplanar and multiecho pulse sequences of the thoracic and lumbar spine were obtained without intravenous contrast. COMPARISON:  None. FINDINGS: MRI THORACIC SPINE FINDINGS Alignment:  Physiologic. Vertebrae: Sparing T1 and T2 there are numerous T1 and T2 hypo intense rounded lesions throughout the thoracic bodies and posterior elements. Posterior cortex thinning/breakthrough right paracentral at T11, without neural impingement or measurable epidural mass. Anticipate postcontrast imaging. No visible effacement in the foramina. Cord:  Normal. Paraspinal and other soft tissues: No visible adenopathy or lung nodule. Disc levels: No notable degenerative changes. MRI LUMBAR SPINE FINDINGS Segmentation:  Standard. Alignment:  Reversal of lordosis due to compression deformity. Vertebrae: Numerous rounded metastases throughout the bodies and posterior elements of the lumbar spine and visualized sacrum. Large lesion infiltrating the L4 body with compression fracture that is acute based on lumbar spine radiography June 2017 and perispinal edema. Height loss measures 60-70% centrally. There is posterior bulging with possible epidural tumor causing high-grade canal  stenosis. Postcontrast imaging would better define the extent of extraosseous tumor extension. No gross foraminal impingement. No other compression fracture is noted. Conus medullaris: Extends to the T12/L1 disc level and appears normal. Paraspinal and other soft tissues: Paraspinal edema around the compression fracture Disc levels: L5-S1 left eccentric disc protrusion with left more than right S1 impingement in the subarticular recesses. IMPRESSION: 1. Innumerable osseous metastases with acute pathologic fracture the L4 vertebral body. 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 2. L5-S1 protrusion with left more than right S1 impingement. Electronically Signed   By: Monte Fantasia M.D.   On: 12/31/2015 20:32   Mr Lumbar Spine Wo Contrast  Result Date: 12/31/2015 CLINICAL DATA:  History of breast cancer with unremitting back pain EXAM: MRI THORACIC AND LUMBAR SPINE WITHOUT CONTRAST TECHNIQUE: Multiplanar and multiecho pulse sequences of the thoracic and lumbar spine were obtained without intravenous contrast. COMPARISON:  None. FINDINGS: MRI THORACIC SPINE FINDINGS Alignment:  Physiologic. Vertebrae: Sparing T1 and T2 there are numerous T1 and T2 hypo intense rounded lesions throughout the thoracic bodies and posterior elements. Posterior cortex thinning/breakthrough right paracentral at T11, without neural impingement or measurable epidural mass. Anticipate postcontrast imaging. No visible effacement in the foramina. Cord:  Normal. Paraspinal and other soft tissues: No visible adenopathy or lung nodule. Disc levels: No notable degenerative changes. MRI LUMBAR SPINE FINDINGS Segmentation:  Standard. Alignment:  Reversal of lordosis due to compression deformity. Vertebrae: Numerous rounded metastases throughout the bodies and posterior elements of the lumbar spine and visualized sacrum. Large lesion infiltrating the L4 body with compression fracture that is acute based on lumbar spine  radiography June 2017 and perispinal edema. Height loss measures 60-70% centrally. There is  posterior bulging with possible epidural tumor causing high-grade canal stenosis. Postcontrast imaging would better define the extent of extraosseous tumor extension. No gross foraminal impingement. No other compression fracture is noted. Conus medullaris: Extends to the T12/L1 disc level and appears normal. Paraspinal and other soft tissues: Paraspinal edema around the compression fracture Disc levels: L5-S1 left eccentric disc protrusion with left more than right S1 impingement in the subarticular recesses. IMPRESSION: 1. Innumerable osseous metastases with acute pathologic fracture the L4 vertebral body. 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 2. L5-S1 protrusion with left more than right S1 impingement. Electronically Signed   By: Monte Fantasia M.D.   On: 12/31/2015 20:32    ASSESSMENT & PLAN:   Metastatic breast cancer until proven otherwise Her overall presentation is highly suspicious for metastatic breast cancer to the bone. 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 I will consult radiation oncology She will also benefit from bisphosphonates therapy in the future  Severe back pain, resolving She has responded very well with high-dose dexamethasone I will switch her from IV to by mouth and she will continue narcotic prescription medicine as needed for pain  Severe constipation This has been going  on, due to immobility and poor diet I recommend trial of aggressive laxative therapy  Significant weakness, improving She is improving on high-dose dexamethasone. Overnight, the patient started to walk without difficulties Recommend PT evaluation  DVT prophylaxis On Lovenox  Venous access I recommend port access rather than using peripheral IV  Discharge planning If she continues to do well over the next 24-48 hours, she can be discharged with outpatient treatment  All questions were answered. The patient knows to call the clinic with any problems, questions or concerns.    Hensley, Barnum, MD 01/01/2016 11:05 AM

## 2016-01-01 NOTE — Evaluation (Signed)
Physical Therapy Evaluation Patient Details Name: Debbie Martinez MRN: [id number] DOB: Aug 23, 1975 Today's Date: 01/01/2016   History of Present Illness  40 yo female admitted with severe back pain. MRI + mets to spine, pathologic L4 fx. Hx of breast cancer, HTN  Clinical Impression  On eval, pt was Min guard assist for mobility. She walked a total of 75 feet (50 feet with a RW, 25 feet without a device). Pt tolerated activity well. No reports of pain or increase in pain during session. Pt reports she lives on 3rd level of apts so will need to practice stair negotiation as able prior to d/c. Pt could benefit from HHPT, if possible. Will recommend RW for use on days she is having more pain, difficulty mobilizing.     Follow Up Recommendations Home health PT;Supervision - Intermittent    Equipment Recommendations  Rolling walker with 5" wheels    Recommendations for Other Services OT consult     Precautions / Restrictions Precautions Precautions: Fall Precaution Comments: logroll and back precautions for comfort Restrictions Weight Bearing Restrictions: No      Mobility  Bed Mobility Overal bed mobility: Needs Assistance Bed Mobility: Rolling;Sidelying to Sit Rolling: Supervision Sidelying to sit: Supervision       General bed mobility comments: for cues. Logroll.   Transfers Overall transfer level: Needs assistance Equipment used: Rolling walker (2 wheeled);None Transfers: Sit to/from Stand Sit to Stand: Min guard         General transfer comment: close guard for safety. VCs safety, hand placement  Ambulation/Gait Ambulation/Gait assistance: Min guard Ambulation Distance (Feet): 75 Feet Assistive device: Rolling walker (2 wheeled);None Gait Pattern/deviations: Decreased stride length;Step-through pattern     General Gait Details: walked 20' with RW, then 25' without a device. slow, guarded gait pattern noted when pt was not using a device. No overt LOB.    Stairs            Wheelchair Mobility    Modified Rankin (Stroke Patients Only)       Balance                                             Pertinent Vitals/Pain Pain Assessment: No/denies pain    Home Living Family/patient expects to be discharged to:: Private residence Living Arrangements: Children (10 yo)   Type of Home: Apartment Home Access: Stairs to enter Entrance Stairs-Rails: Right Entrance Stairs-Number of Steps: lives on 3rd level so 2 flights of steps Home Layout: One level Home Equipment: None      Prior Function Level of Independence: Needs assistance         Comments: pt reports 86 yo daughter has been assisting her since june     Hand Dominance        Extremity/Trunk Assessment   Upper Extremity Assessment: Overall WFL for tasks assessed           Lower Extremity Assessment: Generalized weakness      Cervical / Trunk Assessment: Normal  Communication   Communication: No difficulties  Cognition Arousal/Alertness: Awake/alert Behavior During Therapy: WFL for tasks assessed/performed Overall Cognitive Status: Within Functional Limits for tasks assessed                      General Comments      Exercises        Assessment/Plan  PT Assessment Patient needs continued PT services  PT Diagnosis Difficulty walking;Generalized weakness;Acute pain   PT Problem List Decreased strength;Decreased mobility;Decreased activity tolerance;Decreased knowledge of use of DME;Pain  PT Treatment Interventions DME instruction;Gait training;Stair training;Functional mobility training;Therapeutic activities;Therapeutic exercise;Balance training;Patient/family education   PT Goals (Current goals can be found in the Care Plan section) Acute Rehab PT Goals Patient Stated Goal: regain PLOF, independence PT Goal Formulation: With patient Time For Goal Achievement: 01/15/16 Potential to Achieve Goals: Good     Frequency Min 3X/week   Barriers to discharge        Co-evaluation               End of Session Equipment Utilized During Treatment: Gait belt Activity Tolerance: Patient tolerated treatment well Patient left: in bed;with call bell/phone within reach (sitting EOB with MD)           Time: [id number] PT Time Calculation (min) (ACUTE ONLY): 20 min   Charges:   PT Evaluation $PT Eval Low Complexity: 1 Procedure     PT G Codes:        Weston Anna, MPT Pager: [phone]

## 2016-01-01 NOTE — Progress Notes (Signed)
Chaplain visit the result of a Spiritual Consult ([place]) in Debbie Martinez's EPIC chart. Debbie Martinez has recurrent cancer awaiting a medical evaluation to determine the full implications of this new outbreak. She is resolved to do what it takes to battle her cancer but is at the moment stressed over what may happen, She is a 40 year old mother who fears for her daughter's future.  Chaplain and patient had a discussion at length about reducing her stress. She is a person of faith in the [place] and a member of a [employer]. She uses her faith to assist her through hard times, and considers her faith a vital part of her healing. It is important to Debbie Martinez that chaplains come to see her to assist in this part of her medical and spiritual progress.   marked completed based on this visit.  Please page the chaplain should Debbie Martinez need or request further spiritual care.  Sallee Lange. Alnita Aybar, DMin, MDiv Chaplain

## 2016-01-01 NOTE — Progress Notes (Signed)
PROGRESS NOTE    Debbie Martinez  [id number] DOB: February 20, 1976 DOA: 12/31/2015 PCP: PROVIDER NOT IN SYSTEM   Outpatient Specialists:Dr. Lindi Adie     Brief Narrative:  Debbie Martinez is a 40 y.o. female with medical history significant of breast cancer (s/p of R lumpectomy), HTN, gerd and anxiety, who presents with severe back pain.  Pt states that she has been having back pain since June, which has been progressively getting worse. Currently the back pain is constant, 10 out of 10 severity, radiating to both legs posteriorly. She also has mild leg weakness and numbness on both sides. It is aggravated by movement. Pt denies any recent injury, fall, or MVC. She denies any loss of control over her bowel or bladder. She reports urinary frequency, but no dysuria or burning on urination. No fever, chills, nausea, vomiting, diarrhea, abdominal pain, chest pain, shortness of breath. No lesion change or hearing loss.   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  -Neurosurgeon, Dr. Saintclair Halsted was consulted: lumbar corset--- need to have an idea about her prognosis and life expectancy prior to considering lumbar decompression -Decadron was given in  ED, will continue 4 mg every 4 hour (may need SSI while on steroids) -prn Percocet and Dilaudid for pain -When necessary Robaxin for muscle spasm -f/u oncologist recommendation- Dr. Lindi Adie to see in the AM -CT scan abd/pelvis done for staging that showed Widespread bony metastatic involvement  Breast cancer of upper-inner quadrant of right female breast with metastasis: s/p of right lumpectomy. Patient has been followed up by Dr. Lindi Adie. Last seen was on 07/07/15. Pt used to be on tamoxifen  which was discontinued due to allergic reaction. Dr. Lindi Adie to see in the AM  HTN: Blood pressure was 138/95 -Continue atenolol  Anxiety:  -continue Valium   DVT prophylaxis:  SCD's  Code Status: Full Code   Family Communication: patient  Disposition Plan:     Consultants:   NS  oncology  Procedures:        Subjective: Steroids helped pain and movement  Objective: Vitals:   01/01/16 0000 01/01/16 0025 01/01/16 0059 01/01/16 0528  BP: (!) 167/101 154/98 139/80 (!) 151/92  Pulse: 91 86 80 79  Resp:  14 19 17   Temp:  98.5 F (36.9 C) 98.4 F (36.9 C) 98 F (36.7 C)  TempSrc:   Oral Oral  SpO2: 98% 98% 99% 98%  Weight:   78.8 kg (173 lb 11.6 oz) 78.8 kg (173 lb 11.6 oz)  Height:   5\' 7"  (1.702 m) 5\' 7"  (1.702 m)    Intake/Output Summary (Last 24 hours) at 01/01/16 1401 Last data filed at 01/01/16 0510  Gross per 24 hour  Intake            282.5 ml  Output                0 ml  Net            282.5 ml   Filed Weights   01/01/16 0059 01/01/16 0528  Weight: 78.8 kg (173 lb 11.6 oz) 78.8 kg (173 lb 11.6 oz)    Examination:  General exam: Appears calm and comfortable  Respiratory system: Clear to auscultation. Respiratory  effort normal. Cardiovascular system: S1 & S2 heard, RRR. No JVD, murmurs, rubs, gallops or clicks. No pedal edema. Gastrointestinal system: Abdomen is nondistended, soft and nontender. No organomegaly or masses felt. Normal bowel sounds heard. Central nervous system: Alert and oriented. No focal neurological deficits. Extremities: Symmetric 5 x 5 power. Skin: No rashes, lesions or ulcers Psychiatry: Judgement and insight appear normal. Mood & affect appropriate.     Data Reviewed: I have personally reviewed following labs and imaging studies  CBC:  Recent Labs Lab 12/31/15 2150  WBC 8.2  NEUTROABS 5.4  HGB 10.9*  HCT 35.8*  MCV 83.6  PLT [id number]   Basic Metabolic Panel:  Recent Labs Lab 12/31/15 2150  NA 142    K 4.4  CL 104  CO2 30  GLUCOSE 99  BUN 10  CREATININE 0.77  CALCIUM 10.2   GFR: Estimated Creatinine Clearance: 102.1 mL/min (by C-G formula based on SCr of 0.8 mg/dL). Liver Function Tests:  Recent Labs Lab 12/31/15 2150  AST 25  ALT 24  ALKPHOS 107  BILITOT 0.5  PROT 8.1  ALBUMIN 3.4*   No results for input(s): LIPASE, AMYLASE in the last 168 hours. No results for input(s): AMMONIA in the last 168 hours. Coagulation Profile:  Recent Labs Lab 12/31/15 0009  INR 1.10   Cardiac Enzymes: No results for input(s): CKTOTAL, CKMB, CKMBINDEX, TROPONINI in the last 168 hours. BNP (last 3 results) No results for input(s): PROBNP in the last 8760 hours. HbA1C: No results for input(s): HGBA1C in the last 72 hours. CBG:  Recent Labs Lab 01/01/16 0816  GLUCAP 221*   Lipid Profile: No results for input(s): CHOL, HDL, LDLCALC, TRIG, CHOLHDL, LDLDIRECT in the last 72 hours. Thyroid Function Tests: No results for input(s): TSH, T4TOTAL, FREET4, T3FREE, THYROIDAB in the last 72 hours. Anemia Panel: No results for input(s): VITAMINB12, FOLATE, FERRITIN, TIBC, IRON, RETICCTPCT in the last 72 hours. Urine analysis:    Component Value Date/Time   COLORURINE YELLOW 12/31/2015 0020   APPEARANCEUR TURBID (A) 12/31/2015 0020   LABSPEC 1.022 12/31/2015 0020   PHURINE 7.0 12/31/2015 0020   GLUCOSEU NEGATIVE 12/31/2015 0020   HGBUR NEGATIVE 12/31/2015 0020   BILIRUBINUR NEGATIVE 12/31/2015 0020   KETONESUR NEGATIVE 12/31/2015 0020   PROTEINUR NEGATIVE 12/31/2015 0020   NITRITE NEGATIVE 12/31/2015 0020   LEUKOCYTESUR SMALL (A) 12/31/2015 0020     )No results found for this or any previous visit (from the past 240 hour(s)).    Anti-infectives    None       Radiology Studies: Ct Chest W Contrast  Result Date: 01/01/2016 CLINICAL DATA:  Breast cancer with bony metastases and low back pain. EXAM: CT CHEST, ABDOMEN, AND PELVIS WITH CONTRAST TECHNIQUE: Multidetector CT  imaging of the chest, abdomen and pelvis was performed following the standard protocol during bolus administration of intravenous contrast. CONTRAST:  120mL ISOVUE-300 IOPAMIDOL (ISOVUE-300) INJECTION 61% COMPARISON:  None. FINDINGS: CT CHEST FINDINGS Cardiovascular: The heart size is normal. No pericardial effusion. No thoracic aortic aneurysm. Mediastinum/Nodes: No mediastinal lymphadenopathy. There is no hilar lymphadenopathy. There is no axillary lymphadenopathy. The esophagus has normal imaging features. Lungs/Pleura: Post radiation change identified in the subpleural anterior right lung. 2 mm right upper lobe pulmonary nodule is seen on image 41 series for. No other suspicious pulmonary nodule or mass. No focal airspace consolidation. No evidence for pulmonary edema or pleural effusion. Musculoskeletal: 3.5 x 4.3 cm rim enhancing fluid collection medial right breast likely related to lumpectomy. Multiple lytic  bone lesions are seen in the thoracic spine CT ABDOMEN PELVIS FINDINGS Hepatobiliary: No focal abnormality within the liver parenchyma. There is no evidence for gallstones, gallbladder wall thickening, or pericholecystic fluid. No intrahepatic or extrahepatic biliary dilation. Pancreas: No focal mass lesion. No dilatation of the main duct. No intraparenchymal cyst. No peripancreatic edema. Spleen: No splenomegaly. No focal mass lesion. Adrenals/Urinary Tract: No adrenal nodule or mass. Small cyst identified upper pole left kidney No evidence for hydroureter. The urinary bladder appears normal for the degree of distention. Stomach/Bowel: Stomach is nondistended. No gastric wall thickening. No evidence of outlet obstruction. Duodenum is normally positioned as is the ligament of Treitz. No small bowel wall thickening. No small bowel dilatation. The terminal ileum is normal. The appendix is normal. No gross colonic mass. No colonic wall thickening. No substantial diverticular change. Vascular/Lymphatic: No  abdominal aortic aneurysm. No abdominal aortic atherosclerotic calcification. There is no gastrohepatic or hepatoduodenal ligament lymphadenopathy. No intraperitoneal or retroperitoneal lymphadenopathy. No pelvic sidewall lymphadenopathy. Reproductive: The uterus has normal CT imaging appearance. There is no adnexal mass. Other: No intraperitoneal free fluid. Musculoskeletal: Innumerable sclerotic lesions are identified within the bony anatomy. L4 compression fracture better characterized on recent MRI. IMPRESSION: 1. Tiny right upper lobe pulmonary nodule. Continued attention on follow-up imaging recommended. 2. Post radiation changes anterior right lung with focal fluid collection medial right breast likely related to prior lumpectomy. 3. Widespread bony metastatic involvement. Electronically Signed   By: Misty Stanley M.D.   On: 01/01/2016 13:33   Mr Thoracic Spine Wo Contrast  Result Date: 12/31/2015 CLINICAL DATA:  History of breast cancer with unremitting back pain EXAM: MRI THORACIC AND LUMBAR SPINE WITHOUT CONTRAST TECHNIQUE: Multiplanar and multiecho pulse sequences of the thoracic and lumbar spine were obtained without intravenous contrast. COMPARISON:  None. FINDINGS: MRI THORACIC SPINE FINDINGS Alignment:  Physiologic. Vertebrae: Sparing T1 and T2 there are numerous T1 and T2 hypo intense rounded lesions throughout the thoracic bodies and posterior elements. Posterior cortex thinning/breakthrough right paracentral at T11, without neural impingement or measurable epidural mass. Anticipate postcontrast imaging. No visible effacement in the foramina. Cord:  Normal. Paraspinal and other soft tissues: No visible adenopathy or lung nodule. Disc levels: No notable degenerative changes. MRI LUMBAR SPINE FINDINGS Segmentation:  Standard. Alignment:  Reversal of lordosis due to compression deformity. Vertebrae: Numerous rounded metastases throughout the bodies and posterior elements of the lumbar spine and  visualized sacrum. Large lesion infiltrating the L4 body with compression fracture that is acute based on lumbar spine radiography June 2017 and perispinal edema. Height loss measures 60-70% centrally. There is posterior bulging with possible epidural tumor causing high-grade canal stenosis. Postcontrast imaging would better define the extent of extraosseous tumor extension. No gross foraminal impingement. No other compression fracture is noted. Conus medullaris: Extends to the T12/L1 disc level and appears normal. Paraspinal and other soft tissues: Paraspinal edema around the compression fracture Disc levels: L5-S1 left eccentric disc protrusion with left more than right S1 impingement in the subarticular recesses. IMPRESSION: 1. Innumerable osseous metastases with acute pathologic fracture the L4 vertebral body. 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 2. L5-S1 protrusion with left more than right S1 impingement. Electronically Signed   By: Monte Fantasia M.D.   On: 12/31/2015 20:32   Mr Lumbar Spine Wo Contrast  Result Date: 12/31/2015 CLINICAL DATA:  History of breast cancer with unremitting back pain EXAM: MRI THORACIC AND LUMBAR SPINE WITHOUT CONTRAST TECHNIQUE: Multiplanar and multiecho pulse sequences of the  thoracic and lumbar spine were obtained without intravenous contrast. COMPARISON:  None. FINDINGS: MRI THORACIC SPINE FINDINGS Alignment:  Physiologic. Vertebrae: Sparing T1 and T2 there are numerous T1 and T2 hypo intense rounded lesions throughout the thoracic bodies and posterior elements. Posterior cortex thinning/breakthrough right paracentral at T11, without neural impingement or measurable epidural mass. Anticipate postcontrast imaging. No visible effacement in the foramina. Cord:  Normal. Paraspinal and other soft tissues: No visible adenopathy or lung nodule. Disc levels: No notable degenerative changes. MRI LUMBAR SPINE FINDINGS Segmentation:  Standard.  Alignment:  Reversal of lordosis due to compression deformity. Vertebrae: Numerous rounded metastases throughout the bodies and posterior elements of the lumbar spine and visualized sacrum. Large lesion infiltrating the L4 body with compression fracture that is acute based on lumbar spine radiography June 2017 and perispinal edema. Height loss measures 60-70% centrally. There is posterior bulging with possible epidural tumor causing high-grade canal stenosis. Postcontrast imaging would better define the extent of extraosseous tumor extension. No gross foraminal impingement. No other compression fracture is noted. Conus medullaris: Extends to the T12/L1 disc level and appears normal. Paraspinal and other soft tissues: Paraspinal edema around the compression fracture Disc levels: L5-S1 left eccentric disc protrusion with left more than right S1 impingement in the subarticular recesses. IMPRESSION: 1. Innumerable osseous metastases with acute pathologic fracture the L4 vertebral body. 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 2. L5-S1 protrusion with left more than right S1 impingement. Electronically Signed   By: Monte Fantasia M.D.   On: 12/31/2015 20:32   Ct Abdomen Pelvis W Contrast  Result Date: 01/01/2016 CLINICAL DATA:  Breast cancer with bony metastases and low back pain. EXAM: CT CHEST, ABDOMEN, AND PELVIS WITH CONTRAST TECHNIQUE: Multidetector CT imaging of the chest, abdomen and pelvis was performed following the standard protocol during bolus administration of intravenous contrast. CONTRAST:  121mL ISOVUE-300 IOPAMIDOL (ISOVUE-300) INJECTION 61% COMPARISON:  None. FINDINGS: CT CHEST FINDINGS Cardiovascular: The heart size is normal. No pericardial effusion. No thoracic aortic aneurysm. Mediastinum/Nodes: No mediastinal lymphadenopathy. There is no hilar lymphadenopathy. There is no axillary lymphadenopathy. The esophagus has normal imaging features. Lungs/Pleura: Post  radiation change identified in the subpleural anterior right lung. 2 mm right upper lobe pulmonary nodule is seen on image 41 series for. No other suspicious pulmonary nodule or mass. No focal airspace consolidation. No evidence for pulmonary edema or pleural effusion. Musculoskeletal: 3.5 x 4.3 cm rim enhancing fluid collection medial right breast likely related to lumpectomy. Multiple lytic bone lesions are seen in the thoracic spine CT ABDOMEN PELVIS FINDINGS Hepatobiliary: No focal abnormality within the liver parenchyma. There is no evidence for gallstones, gallbladder wall thickening, or pericholecystic fluid. No intrahepatic or extrahepatic biliary dilation. Pancreas: No focal mass lesion. No dilatation of the main duct. No intraparenchymal cyst. No peripancreatic edema. Spleen: No splenomegaly. No focal mass lesion. Adrenals/Urinary Tract: No adrenal nodule or mass. Small cyst identified upper pole left kidney No evidence for hydroureter. The urinary bladder appears normal for the degree of distention. Stomach/Bowel: Stomach is nondistended. No gastric wall thickening. No evidence of outlet obstruction. Duodenum is normally positioned as is the ligament of Treitz. No small bowel wall thickening. No small bowel dilatation. The terminal ileum is normal. The appendix is normal. No gross colonic mass. No colonic wall thickening. No substantial diverticular change. Vascular/Lymphatic: No abdominal aortic aneurysm. No abdominal aortic atherosclerotic calcification. There is no gastrohepatic or hepatoduodenal ligament lymphadenopathy. No intraperitoneal or retroperitoneal lymphadenopathy. No pelvic sidewall lymphadenopathy. Reproductive:  The uterus has normal CT imaging appearance. There is no adnexal mass. Other: No intraperitoneal free fluid. Musculoskeletal: Innumerable sclerotic lesions are identified within the bony anatomy. L4 compression fracture better characterized on recent MRI. IMPRESSION: 1. Tiny right  upper lobe pulmonary nodule. Continued attention on follow-up imaging recommended. 2. Post radiation changes anterior right lung with focal fluid collection medial right breast likely related to prior lumpectomy. 3. Widespread bony metastatic involvement. Electronically Signed   By: Misty Stanley M.D.   On: 01/01/2016 13:33        Scheduled Meds: . atenolol  50 mg Oral Daily  . cholecalciferol  1,000 Units Oral Daily  . dexamethasone  4 mg Oral Q6H  . multivitamin  15 mL Oral Daily  . polyethylene glycol  17 g Oral Daily  . senna-docusate  1 tablet Oral BID   Continuous Infusions: . sodium chloride 75 mL/hr at 01/01/16 0124     LOS: 1 day    Time spent: 25 min    Cooper, DO [facility] Pager [phone]  If 7PM-7AM, please contact night-coverage [email or web address] Password [email or web address] 01/01/2016, 2:01 PM

## 2016-01-02 ENCOUNTER — Ambulatory Visit
Admission: RE | Admit: 2016-01-02 | Discharge: 2016-01-02 | Disposition: A | Discharge status: Home / Self Care | Payer: Medicaid Other | Source: Ambulatory Visit | Attending: Radiation Oncology | Admitting: Radiation Oncology

## 2016-01-02 ENCOUNTER — Ambulatory Visit
Admit: 2016-01-02 | Discharge: 2016-01-02 | Disposition: A | Discharge status: Home / Self Care | Payer: Medicaid Other | Attending: Radiation Oncology | Admitting: Radiation Oncology

## 2016-01-02 DIAGNOSIS — C50919 Malignant neoplasm of unspecified site of unspecified female breast: Secondary | ICD-10-CM

## 2016-01-02 DIAGNOSIS — C7951 Secondary malignant neoplasm of bone: Secondary | ICD-10-CM

## 2016-01-02 LAB — GLUCOSE, CAPILLARY: Glucose-Capillary: 213 mg/dL — ABNORMAL HIGH (ref 65–99)

## 2016-01-02 NOTE — Progress Notes (Signed)
HEMATOLOGY-ONCOLOGY PROGRESS NOTE  SUBJECTIVE: Patient is able to move her legs and is able to walk to the back with the help of support from the IV pole Continues to have back discomfort but is getting pain medications for it. Previously patient was on tamoxifen therapy which she stopped 2 months ago for skin itching and irritability supposedly an allergic reaction along with leg swelling to tamoxifen therapy.   OBJECTIVE: REVIEW OF SYSTEMS:   Constitutional: Denies fevers, chills or abnormal weight loss Eyes: Denies blurriness of vision Ears, nose, mouth, throat, and face: Denies mucositis or sore throat Respiratory: Denies cough, dyspnea or wheezes Cardiovascular: Denies palpitation, chest discomfort Gastrointestinal:  Denies nausea, heartburn or change in bowel habits Skin: Denies abnormal skin rashes Lymphatics: Denies new lymphadenopathy or easy bruising Neurological: Both lower extremities were giving away and feel weak Behavioral/Psych: Mood is stable, no new changes  Extremities: Bilateral SCDs All other systems were reviewed with the patient and are negative.  I have reviewed the past medical history, past surgical history, social history and family history with the patient and they are unchanged from previous note.   PHYSICAL EXAMINATION: ECOG PERFORMANCE STATUS: 3 - Symptomatic, >50% confined to bed  Vitals:   01/02/16 1313 01/02/16 1507  BP:  (!) 135/91  Pulse:  81  Resp:  16  Temp: 99.1 F (37.3 C) 98.3 F (36.8 C)   Filed Weights   01/01/16 0059 01/01/16 0528  Weight: 173 lb 11.6 oz (78.8 kg) 173 lb 11.6 oz (78.8 kg)    GENERAL:alert, no distress and comfortable SKIN: skin color, texture, turgor are normal, no rashes or significant lesions EYES: normal, Conjunctiva are pink and non-injected, sclera clear OROPHARYNX:no exudate, no erythema and lips, buccal mucosa, and tongue normal  NECK: supple, thyroid normal size, non-tender, without nodularity LYMPH:   no palpable lymphadenopathy in the cervical, axillary or inguinal LUNGS: clear to auscultation and percussion with normal breathing effort HEART: regular rate & rhythm and no murmurs and no lower extremity edema ABDOMEN:abdomen soft, non-tender and normal bowel sounds Musculoskeletal:no cyanosis of digits and no clubbing  NEURO: alert & oriented x 3 with fluent speech, Lower extremity weakness, strength 3/5 plantar's downgoing  LABORATORY DATA:  I have reviewed the data as listed CMP Latest Ref Rng & Units 12/31/2015 08/20/2014 07/01/2014  Glucose 65 - 99 mg/dL 99 120(H) 153(H)  BUN 6 - 20 mg/dL 10 11 12.3  Creatinine 0.44 - 1.00 mg/dL 0.77 0.66 0.8  Sodium 135 - 145 mmol/L 142 140 142  Potassium 3.5 - 5.1 mmol/L 4.4 4.2 3.9  Chloride 101 - 111 mmol/L 104 107 -  CO2 22 - 32 mmol/L 30 27 24   Calcium 8.9 - 10.3 mg/dL 10.2 9.5 9.2  Total Protein 6.5 - 8.1 g/dL 8.1 - 7.1  Total Bilirubin 0.3 - 1.2 mg/dL 0.5 - 0.27  Alkaline Phos 38 - 126 U/L 107 - 63  AST 15 - 41 U/L 25 - 19  ALT 14 - 54 U/L 24 - 35    Lab Results  Component Value Date   WBC 8.2 12/31/2015   HGB 10.9 (L) 12/31/2015   HCT 35.8 (L) 12/31/2015   MCV 83.6 12/31/2015   PLT 325 12/31/2015   NEUTROABS 5.4 12/31/2015    ASSESSMENT AND PLAN: 1. Metastatic breast cancer with cord compression at T11. Patient has extensive metastatic disease throughout her bones in the spine. The CT of the chest abdomen pelvis does not reveal any evidence of other systemic disease spread.  I discussed with her that surgical options are under Dr. Windy Carina discretion. Ideally the plan would be to involve stabilization of the spine followed by palliative radiation therapy. Systemic therapy with antiestrogen treatments (including oophorectomy) + CDK 4/6 inh will be started once the radiation is completed.  2. Prognosis: Patient has bone only disease and is likely to have reasonable life expectancy. 3. Patient will receive bisphosphonate therapy as  an outpatient for bone metastases.

## 2016-01-02 NOTE — Progress Notes (Signed)
Physical Therapy Treatment Patient Details Name: Debbie Martinez MRN: [id number] DOB: 19-Dec-1975 Today's Date: 01/02/2016    History of Present Illness 40 yo female admitted with severe back pain. MRI + mets to spine, pathologic L4 fx. Hx of breast cancer, HTN    PT Comments    Applied corsett and instructed on use for comfort when OOB.  Assisted to bathroom then amb a greater distance in hallway.  Removed corsett and assisted back to bed.    Follow Up Recommendations  Home health PT;Supervision - Intermittent     Equipment Recommendations  Rolling walker with 5" wheels    Recommendations for Other Services       Precautions / Restrictions Precautions Precautions: Fall Precaution Comments: logroll and back precautions for comfort Required Braces or Orthoses: Spinal Brace Spinal Brace: Applied in standing position;Lumbar corset;Applied in sitting position Restrictions Weight Bearing Restrictions: No    Mobility  Bed Mobility Overal bed mobility: Needs Assistance Bed Mobility: Rolling;Sidelying to Sit;Sit to Sidelying Rolling: Min guard;Supervision Sidelying to sit: Supervision;Min guard     Sit to sidelying: Min guard;Supervision General bed mobility comments: for cues. Logroll.   Increased time  Transfers Overall transfer level: Needs assistance Equipment used: Rolling walker (2 wheeled);None Transfers: Sit to/from Stand           General transfer comment: close guard for safety. VCs safety, hand placement.  assisted in bathroom as well.   Ambulation/Gait Ambulation/Gait assistance: Supervision Ambulation Distance (Feet): 145 Feet Assistive device: Rolling walker (2 wheeled);None   Gait velocity: decreased   General Gait Details: tolerated increased distance.  Good alternating gait with no LOB and use of RW for increased safety   Stairs            Wheelchair Mobility    Modified Rankin (Stroke Patients Only)       Balance                                     Cognition Arousal/Alertness: Awake/alert Behavior During Therapy: WFL for tasks assessed/performed Overall Cognitive Status: Within Functional Limits for tasks assessed                      Exercises      General Comments        Pertinent Vitals/Pain Pain Assessment: Faces Faces Pain Scale: Hurts little more Pain Location: back with activity Pain Descriptors / Indicators: Grimacing Pain Intervention(s): Monitored during session;Repositioned    Home Living                      Prior Function            PT Goals (current goals can now be found in the care plan section) Progress towards PT goals: Progressing toward goals    Frequency  Min 3X/week    PT Plan Current plan remains appropriate    Co-evaluation             End of Session Equipment Utilized During Treatment: Gait belt Activity Tolerance: Patient tolerated treatment well Patient left: in bed;with call bell/phone within reach     Time: [id number] PT Time Calculation (min) (ACUTE ONLY): 17 min  Charges:  $Gait Training: 8-22 mins                    G Codes:      Debbie Martinez  PTA [facility]  Acute  Rehab Pager      [phone]

## 2016-01-02 NOTE — Progress Notes (Signed)
OT Cancellation Note  Patient Details Name: Debbie Martinez MRN: [id number] DOB: Apr 14, 1976   Cancelled Treatment:    Reason Eval/Treat Not Completed: Other (comment)Pt  Just back to bed after walk.  Explained role of OT.  Pt states back brace is coming today and is hopeful this will help. Will check on pt later in day or next day as schedule allows.  Betsy Pries  01/02/2016  Kari Baars, [place]  01/02/2016, 11:44 AM

## 2016-01-02 NOTE — Progress Notes (Signed)
PROGRESS NOTE    Debbie Martinez  [id number] DOB: [birth year] DOA: 12/31/2015 PCP: PROVIDER NOT IN SYSTEM   Outpatient Specialists:Dr. Lindi Adie     Brief Narrative:  Debbie Martinez is a 40 y.o. female with medical history significant of breast cancer (s/p of R lumpectomy), HTN, gerd and anxiety, who presents with severe back pain.  Pt states that she has been having back pain since June, which has been progressively getting worse. Currently the back pain is constant, 10 out of 10 severity, radiating to both legs posteriorly. She also has mild leg weakness and numbness on both sides. It is aggravated by movement. Pt denies any recent injury, fall, or MVC. She denies any loss of control over her bowel or bladder. She reports urinary frequency, but no dysuria or burning on urination. No fever, chills, nausea, vomiting, diarrhea, abdominal pain, chest pain, shortness of breath. No lesion change or hearing loss.   Assessment & Plan:   Principal Problem:   Back pain Active Problems:   Breast cancer of upper-inner quadrant of right female breast (HCC)   Metastatic breast cancer (HCC)   Essential hypertension   Anxiety   Back pain: this due to metastasized disease and pathologic L4 fracture.  -Neurosurgeon, Dr. Saintclair Halsted was consulted: lumbar corset--- need to have an idea about her prognosis and life expectancy prior to considering lumbar decompression -Decadron was given in  ED, will continue 4 mg every 4 hour (may need SSI while on steroids) -prn Percocet and Dilaudid for pain -When necessary Robaxin for muscle spasm -f/u oncologist recommendation- Dr. Lindi Adie to see patient -CT scan abd/pelvis done for staging that showed Widespread bony metastatic involvement  Breast cancer of upper-inner quadrant of right female breast with metastasis: s/p of right lumpectomy. Patient has been followed up by Dr. Lindi Adie. Last seen was on 07/07/15. Pt used to be on tamoxifen which was discontinued  due to allergic reaction. Dr. Lindi Adie to see in the AM  HTN: Blood pressure was 138/95 -Continue atenolol  Anxiety:  -continue Valium   DVT prophylaxis:  SCD's  Code Status: Full Code   Family Communication: patient  Disposition Plan:     Consultants:   NS  oncology  Procedures:        Subjective: Some pressure in back when ambulating today  Objective: Vitals:   01/01/16 0528 01/01/16 1417 01/01/16 2200 01/02/16 0438  BP: (!) 151/92 (!) 148/86 114/72 119/73  Pulse: 79 93 85 72  Resp: 17 16 18 16   Temp: 98 F (36.7 C) 98.9 F (37.2 C) 98.6 F (37 C) 98 F (36.7 C)  TempSrc: Oral Oral Oral Oral  SpO2: 98% 100% 98% 99%  Weight: 78.8 kg (173 lb 11.6 oz)     Height: 5\' 7"  (1.702 m)       Intake/Output Summary (Last 24 hours) at 01/02/16 1035 Last data filed at 01/01/16 1502  Gross per 24 hour  Intake              740 ml  Output                0 ml  Net              740 ml   Filed Weights   01/01/16 0059 01/01/16 0528  Weight: 78.8 kg (173 lb 11.6 oz) 78.8 kg (173 lb 11.6 oz)    Examination:  General exam: Appears calm and comfortable  Respiratory system: Clear to auscultation. Respiratory effort normal. Cardiovascular system:  S1 & S2 heard, RRR. No JVD, murmurs, rubs, gallops or clicks. No pedal edema. Gastrointestinal system: Abdomen is nondistended, soft and nontender. No organomegaly or masses felt. Normal bowel sounds heard. Central nervous system: Alert and oriented.     Data Reviewed: I have personally reviewed following labs and imaging studies  CBC:  Recent Labs Lab 12/31/15 2150  WBC 8.2  NEUTROABS 5.4  HGB 10.9*  HCT 35.8*  MCV 83.6  PLT [id number]   Basic Metabolic Panel:  Recent Labs Lab 12/31/15 2150  NA 142  K 4.4  CL 104  CO2 30  GLUCOSE 99  BUN 10  CREATININE 0.77  CALCIUM 10.2   GFR: Estimated Creatinine Clearance: 102.1 mL/min (by C-G formula based on SCr of 0.8 mg/dL). Liver Function Tests:  Recent  Labs Lab 12/31/15 2150  AST 25  ALT 24  ALKPHOS 107  BILITOT 0.5  PROT 8.1  ALBUMIN 3.4*   No results for input(s): LIPASE, AMYLASE in the last 168 hours. No results for input(s): AMMONIA in the last 168 hours. Coagulation Profile:  Recent Labs Lab 12/31/15 0009  INR 1.10   Cardiac Enzymes: No results for input(s): CKTOTAL, CKMB, CKMBINDEX, TROPONINI in the last 168 hours. BNP (last 3 results) No results for input(s): PROBNP in the last 8760 hours. HbA1C: No results for input(s): HGBA1C in the last 72 hours. CBG:  Recent Labs Lab 01/01/16 0816 01/02/16 0828  GLUCAP 221* 213*   Lipid Profile: No results for input(s): CHOL, HDL, LDLCALC, TRIG, CHOLHDL, LDLDIRECT in the last 72 hours. Thyroid Function Tests: No results for input(s): TSH, T4TOTAL, FREET4, T3FREE, THYROIDAB in the last 72 hours. Anemia Panel: No results for input(s): VITAMINB12, FOLATE, FERRITIN, TIBC, IRON, RETICCTPCT in the last 72 hours. Urine analysis:    Component Value Date/Time   COLORURINE YELLOW 12/31/2015 0020   APPEARANCEUR TURBID (A) 12/31/2015 0020   LABSPEC 1.022 12/31/2015 0020   PHURINE 7.0 12/31/2015 0020   GLUCOSEU NEGATIVE 12/31/2015 0020   HGBUR NEGATIVE 12/31/2015 0020   BILIRUBINUR NEGATIVE 12/31/2015 0020   KETONESUR NEGATIVE 12/31/2015 0020   PROTEINUR NEGATIVE 12/31/2015 0020   NITRITE NEGATIVE 12/31/2015 0020   LEUKOCYTESUR SMALL (A) 12/31/2015 0020     ) Recent Results (from the past 240 hour(s))  Urine culture     Status: Abnormal (Preliminary result)   Collection Time: 12/31/15 12:20 AM  Result Value Ref Range Status   Specimen Description URINE, CLEAN CATCH  Final   Special Requests NONE  Final   Culture 50,000 COLONIES/mL STAPHYLOCOCCUS AUREUS (A)  Final   Report Status PENDING  Incomplete      Anti-infectives    None       Radiology Studies: Ct Chest W Contrast  Result Date: 01/01/2016 CLINICAL DATA:  Breast cancer with bony metastases and low  back pain. EXAM: CT CHEST, ABDOMEN, AND PELVIS WITH CONTRAST TECHNIQUE: Multidetector CT imaging of the chest, abdomen and pelvis was performed following the standard protocol during bolus administration of intravenous contrast. CONTRAST:  114mL ISOVUE-300 IOPAMIDOL (ISOVUE-300) INJECTION 61% COMPARISON:  None. FINDINGS: CT CHEST FINDINGS Cardiovascular: The heart size is normal. No pericardial effusion. No thoracic aortic aneurysm. Mediastinum/Nodes: No mediastinal lymphadenopathy. There is no hilar lymphadenopathy. There is no axillary lymphadenopathy. The esophagus has normal imaging features. Lungs/Pleura: Post radiation change identified in the subpleural anterior right lung. 2 mm right upper lobe pulmonary nodule is seen on image 41 series for. No other suspicious pulmonary nodule or mass. No focal airspace consolidation. No  evidence for pulmonary edema or pleural effusion. Musculoskeletal: 3.5 x 4.3 cm rim enhancing fluid collection medial right breast likely related to lumpectomy. Multiple lytic bone lesions are seen in the thoracic spine CT ABDOMEN PELVIS FINDINGS Hepatobiliary: No focal abnormality within the liver parenchyma. There is no evidence for gallstones, gallbladder wall thickening, or pericholecystic fluid. No intrahepatic or extrahepatic biliary dilation. Pancreas: No focal mass lesion. No dilatation of the main duct. No intraparenchymal cyst. No peripancreatic edema. Spleen: No splenomegaly. No focal mass lesion. Adrenals/Urinary Tract: No adrenal nodule or mass. Small cyst identified upper pole left kidney No evidence for hydroureter. The urinary bladder appears normal for the degree of distention. Stomach/Bowel: Stomach is nondistended. No gastric wall thickening. No evidence of outlet obstruction. Duodenum is normally positioned as is the ligament of Treitz. No small bowel wall thickening. No small bowel dilatation. The terminal ileum is normal. The appendix is normal. No gross colonic mass.  No colonic wall thickening. No substantial diverticular change. Vascular/Lymphatic: No abdominal aortic aneurysm. No abdominal aortic atherosclerotic calcification. There is no gastrohepatic or hepatoduodenal ligament lymphadenopathy. No intraperitoneal or retroperitoneal lymphadenopathy. No pelvic sidewall lymphadenopathy. Reproductive: The uterus has normal CT imaging appearance. There is no adnexal mass. Other: No intraperitoneal free fluid. Musculoskeletal: Innumerable sclerotic lesions are identified within the bony anatomy. L4 compression fracture better characterized on recent MRI. IMPRESSION: 1. Tiny right upper lobe pulmonary nodule. Continued attention on follow-up imaging recommended. 2. Post radiation changes anterior right lung with focal fluid collection medial right breast likely related to prior lumpectomy. 3. Widespread bony metastatic involvement. Electronically Signed   By: Misty Stanley M.D.   On: 01/01/2016 13:33   Mr Thoracic Spine Wo Contrast  Result Date: 12/31/2015 CLINICAL DATA:  History of breast cancer with unremitting back pain EXAM: MRI THORACIC AND LUMBAR SPINE WITHOUT CONTRAST TECHNIQUE: Multiplanar and multiecho pulse sequences of the thoracic and lumbar spine were obtained without intravenous contrast. COMPARISON:  None. FINDINGS: MRI THORACIC SPINE FINDINGS Alignment:  Physiologic. Vertebrae: Sparing T1 and T2 there are numerous T1 and T2 hypo intense rounded lesions throughout the thoracic bodies and posterior elements. Posterior cortex thinning/breakthrough right paracentral at T11, without neural impingement or measurable epidural mass. Anticipate postcontrast imaging. No visible effacement in the foramina. Cord:  Normal. Paraspinal and other soft tissues: No visible adenopathy or lung nodule. Disc levels: No notable degenerative changes. MRI LUMBAR SPINE FINDINGS Segmentation:  Standard. Alignment:  Reversal of lordosis due to compression deformity. Vertebrae: Numerous  rounded metastases throughout the bodies and posterior elements of the lumbar spine and visualized sacrum. Large lesion infiltrating the L4 body with compression fracture that is acute based on lumbar spine radiography June 2017 and perispinal edema. Height loss measures 60-70% centrally. There is posterior bulging with possible epidural tumor causing high-grade canal stenosis. Postcontrast imaging would better define the extent of extraosseous tumor extension. No gross foraminal impingement. No other compression fracture is noted. Conus medullaris: Extends to the T12/L1 disc level and appears normal. Paraspinal and other soft tissues: Paraspinal edema around the compression fracture Disc levels: L5-S1 left eccentric disc protrusion with left more than right S1 impingement in the subarticular recesses. IMPRESSION: 1. Innumerable osseous metastases with acute pathologic fracture the L4 vertebral body. 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 2. L5-S1 protrusion with left more than right S1 impingement. Electronically Signed   By: Monte Fantasia M.D.   On: 12/31/2015 20:32   Mr Lumbar Spine Wo Contrast  Result Date: 12/31/2015 CLINICAL DATA:  History of breast cancer with unremitting back pain EXAM: MRI THORACIC AND LUMBAR SPINE WITHOUT CONTRAST TECHNIQUE: Multiplanar and multiecho pulse sequences of the thoracic and lumbar spine were obtained without intravenous contrast. COMPARISON:  None. FINDINGS: MRI THORACIC SPINE FINDINGS Alignment:  Physiologic. Vertebrae: Sparing T1 and T2 there are numerous T1 and T2 hypo intense rounded lesions throughout the thoracic bodies and posterior elements. Posterior cortex thinning/breakthrough right paracentral at T11, without neural impingement or measurable epidural mass. Anticipate postcontrast imaging. No visible effacement in the foramina. Cord:  Normal. Paraspinal and other soft tissues: No visible adenopathy or lung nodule. Disc levels: No  notable degenerative changes. MRI LUMBAR SPINE FINDINGS Segmentation:  Standard. Alignment:  Reversal of lordosis due to compression deformity. Vertebrae: Numerous rounded metastases throughout the bodies and posterior elements of the lumbar spine and visualized sacrum. Large lesion infiltrating the L4 body with compression fracture that is acute based on lumbar spine radiography June 2017 and perispinal edema. Height loss measures 60-70% centrally. There is posterior bulging with possible epidural tumor causing high-grade canal stenosis. Postcontrast imaging would better define the extent of extraosseous tumor extension. No gross foraminal impingement. No other compression fracture is noted. Conus medullaris: Extends to the T12/L1 disc level and appears normal. Paraspinal and other soft tissues: Paraspinal edema around the compression fracture Disc levels: L5-S1 left eccentric disc protrusion with left more than right S1 impingement in the subarticular recesses. IMPRESSION: 1. Innumerable osseous metastases with acute pathologic fracture the L4 vertebral body. 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 2. L5-S1 protrusion with left more than right S1 impingement. Electronically Signed   By: Monte Fantasia M.D.   On: 12/31/2015 20:32   Ct Abdomen Pelvis W Contrast  Result Date: 01/01/2016 CLINICAL DATA:  Breast cancer with bony metastases and low back pain. EXAM: CT CHEST, ABDOMEN, AND PELVIS WITH CONTRAST TECHNIQUE: Multidetector CT imaging of the chest, abdomen and pelvis was performed following the standard protocol during bolus administration of intravenous contrast. CONTRAST:  126mL ISOVUE-300 IOPAMIDOL (ISOVUE-300) INJECTION 61% COMPARISON:  None. FINDINGS: CT CHEST FINDINGS Cardiovascular: The heart size is normal. No pericardial effusion. No thoracic aortic aneurysm. Mediastinum/Nodes: No mediastinal lymphadenopathy. There is no hilar lymphadenopathy. There is no axillary  lymphadenopathy. The esophagus has normal imaging features. Lungs/Pleura: Post radiation change identified in the subpleural anterior right lung. 2 mm right upper lobe pulmonary nodule is seen on image 41 series for. No other suspicious pulmonary nodule or mass. No focal airspace consolidation. No evidence for pulmonary edema or pleural effusion. Musculoskeletal: 3.5 x 4.3 cm rim enhancing fluid collection medial right breast likely related to lumpectomy. Multiple lytic bone lesions are seen in the thoracic spine CT ABDOMEN PELVIS FINDINGS Hepatobiliary: No focal abnormality within the liver parenchyma. There is no evidence for gallstones, gallbladder wall thickening, or pericholecystic fluid. No intrahepatic or extrahepatic biliary dilation. Pancreas: No focal mass lesion. No dilatation of the main duct. No intraparenchymal cyst. No peripancreatic edema. Spleen: No splenomegaly. No focal mass lesion. Adrenals/Urinary Tract: No adrenal nodule or mass. Small cyst identified upper pole left kidney No evidence for hydroureter. The urinary bladder appears normal for the degree of distention. Stomach/Bowel: Stomach is nondistended. No gastric wall thickening. No evidence of outlet obstruction. Duodenum is normally positioned as is the ligament of Treitz. No small bowel wall thickening. No small bowel dilatation. The terminal ileum is normal. The appendix is normal. No gross colonic mass. No colonic wall thickening. No substantial diverticular change. Vascular/Lymphatic: No abdominal aortic  aneurysm. No abdominal aortic atherosclerotic calcification. There is no gastrohepatic or hepatoduodenal ligament lymphadenopathy. No intraperitoneal or retroperitoneal lymphadenopathy. No pelvic sidewall lymphadenopathy. Reproductive: The uterus has normal CT imaging appearance. There is no adnexal mass. Other: No intraperitoneal free fluid. Musculoskeletal: Innumerable sclerotic lesions are identified within the bony anatomy. L4  compression fracture better characterized on recent MRI. IMPRESSION: 1. Tiny right upper lobe pulmonary nodule. Continued attention on follow-up imaging recommended. 2. Post radiation changes anterior right lung with focal fluid collection medial right breast likely related to prior lumpectomy. 3. Widespread bony metastatic involvement. Electronically Signed   By: Misty Stanley M.D.   On: 01/01/2016 13:33        Scheduled Meds: . atenolol  50 mg Oral Daily  . cholecalciferol  1,000 Units Oral Daily  . dexamethasone  4 mg Oral Q6H  . multivitamin  15 mL Oral Daily  . polyethylene glycol  17 g Oral Daily  . senna-docusate  1 tablet Oral BID   Continuous Infusions:     LOS: 2 days    Time spent: 25 min    [place], DO [facility] Pager [phone]  If 7PM-7AM, please contact night-coverage [email or web address] Password [email or web address] 01/02/2016, 10:35 AM

## 2016-01-02 NOTE — Consult Note (Signed)
Radiation Oncology         [phone] ________________________________  Inpatient re-Consultation  Name: Debbie Martinez MRN: [id number]  Date: 12/31/2015  DOB: 12/27/1975  [id number] NOT IN SYSTEM  No ref. 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 STAGE IV.   HISTORY OF PRESENT ILLNESS::Debbie Martinez is a 40 y.o. female who presented with progress worsening back pain. She had occasional pain that would radiate down the backs of her legs to her knees. She has had episodes where her left leg gave way on her. Pain is especially in her low back occasionally in her tailbone she feels like she can't bend over. But she also has had pain up to her bra strap. She denies any new problems with bowel or bladder dysfunction. She has some frequency but she denies any retention and she denies any changes over the last 1-2 months. Weakness and pain upon admission responded very well with high-dose dexamethasone. She also reports she had decreased leg sensation that resolved since admission.  MRI of T-L spine wo cont showed multiple spinal metastases, most worrisome at L4 where there is pathologic fracture:  Retropulsion and probable epidural tumor causes severe canal stenosis at that level. Also L5-S1 protrusion with left more than right S1 impingement.  Dr. Saintclair Halsted saw her from [facility] and I have spoken with him. He is worried that surgical decompression /stabilization would further destabilize her spine, due to lack of healthy bone to rely upon for stabilization.  He is not recommending surgery.  Discussed as a panel at tumor board this am.  CT CAP negative for visceral metastases.  Discussed with Dr. Lindi Adie of med/onc as well.  PREVIOUS RADIATION THERAPY: Yes, Radiation treatment dates:   11/03/2014-12/14/2014 Site/dose:   1) Right Breast, supraclavicular nodes, axillary nodes, and internal mammary nodes / 50 Gy in 25  fractions 2) Right Breast Boost / 10 Gy in 5 fractions   PAST MEDICAL HISTORY:  has a past medical history of Anxiety; Breast cancer ([place]) (01/20/14); GERD (gastroesophageal reflux disease); Gestational diabetes; Hypertension; and Shortness of breath.    PAST SURGICAL HISTORY: Past Surgical History:  Procedure Laterality Date  . BREAST LUMPECTOMY WITH NEEDLE LOCALIZATION AND AXILLARY SENTINEL LYMPH NODE BX Right 08/23/2014   Procedure: RIGHT BREAST LUMPECTOMY WITH NEEDLE LOCALIZATION(X'S 2)AND RIGHT AXILLARY SENTINEL LYMPH NODE Bi[facility]ies;  Surgeon: Autumn Messing III, MD;  Location: [facility];  Service: General;  Laterality: Right;  . CESAREAN SECTION     2008  . KELOID EXCISION Left    ear  . PORTACATH PLACEMENT Left 01/26/2014   Procedure: INSERTION PORT-A-CATH;  Surgeon: Autumn Messing III, MD;  Location: [facility];  Service: General;  Laterality: Left;  . RE-EXCISION OF BREAST CANCER,SUPERIOR MARGINS Right 09/15/2014   Procedure: RE-EXCISION OF RIGHT BREAST CANCER,SUPERIOR AND INFERIOR MARGINS;  Surgeon: Autumn Messing III, MD;  Location: [facility];  Service: General;  Laterality: Right;    FAMILY HISTORY: family history includes Dementia in her paternal grandmother; Heart attack (age of onset: 83) in her father; Leukemia (age of onset: 25) in her cousin; Lupus in her maternal aunt; Prostate cancer (age of onset: 60) in her maternal grandfather.  SOCIAL HISTORY:  reports that she has never smoked. She does not have any smokeless tobacco history on file. She reports that she drinks alcohol. She reports that she does not use drugs.  ALLERGIES: Lisinopril and Tamoxifen  MEDICATIONS:  Current Facility-Administered Medications  Medication Dose Route Frequency Provider Last Rate Last Dose  . acetaminophen (TYLENOL) tablet 650 mg  650 mg Oral Q6H PRN Ivor Costa, MD       Or  . acetaminophen (TYLENOL) suppository 650 mg  650 mg Rectal Q6H PRN Ivor Costa, MD      . atenolol (TENORMIN) tablet 50 mg  50  mg Oral Daily Ivor Costa, MD   50 mg at 01/02/16 0943  . cholecalciferol (VITAMIN D) tablet 1,000 Units  1,000 Units Oral Daily Ivor Costa, MD   1,000 Units at 01/02/16 0944  . dexamethasone (DECADRON) tablet 4 mg  4 mg Oral Q6H Ni Gorsuch, MD   4 mg at 01/02/16 1232  . diazepam (VALIUM) tablet 5 mg  5 mg Oral BID PRN Ivor Costa, MD   5 mg at 01/01/16 2351  . HYDROmorphone (DILAUDID) injection 1 mg  1 mg Intravenous Q4H PRN Ivor Costa, MD   1 mg at 01/02/16 0701  . iopamidol (ISOVUE-300) 61 % injection 30 mL  30 mL Oral Once PRN Geradine Girt, DO      . lidocaine-prilocaine (EMLA) cream   Topical PRN Heath Lark, MD      . methocarbamol (ROBAXIN) tablet 1,000 mg  1,000 mg Oral QID PRN Ivor Costa, MD      . multivitamin liquid 15 mL  15 mL Oral Daily Ivor Costa, MD   15 mL at 01/02/16 0944  . ondansetron (ZOFRAN-ODT) disintegrating tablet 8 mg  8 mg Oral Q8H PRN Ivor Costa, MD   8 mg at 01/02/16 0943  . oxyCODONE-acetaminophen (PERCOCET/ROXICET) 5-325 MG per tablet 1 tablet  1 tablet Oral Q4H PRN Ivor Costa, MD   1 tablet at 01/02/16 0321  . polyethylene glycol (MIRALAX / GLYCOLAX) packet 17 g  17 g Oral Daily Heath Lark, MD   17 g at 01/02/16 0944  . senna-docusate (Senokot-S) tablet 1 tablet  1 tablet Oral BID Heath Lark, MD   1 tablet at 01/02/16 0944    REVIEW OF SYSTEMS:  Notable for that above.   PHYSICAL EXAM:  height is 5\' 7"  (1.702 m) and weight is 173 lb 11.6 oz (78.8 kg). Her oral temperature is 98.3 F (36.8 C). Her blood pressure is 135/91 (abnormal) and her pulse is 81. Her respiration is 16 and oxygen saturation is 100%.   General: Alert and oriented, in no acute distress Extremities: No cyanosis or edema. Musculoskeletal: symmetric strength and muscle tone throughout arms, has difficulty raising legs (flexing hips) while lying down. Walks w/ walker. Tender to palpation from mid thoracic spine through upper sacrum. Neurologic: as above. Denies numbness of extremities. No gross cranial  nerve deficits.  Psychiatric: Judgment and insight are intact. Affect is appropriate.    ECOG = 3  0 - Asymptomatic (Fully active, able to carry on all predisease activities without restriction)  1 - Symptomatic but completely ambulatory (Restricted in physically strenuous activity but ambulatory and able to carry out work of a light or sedentary nature. For example, light housework, office work)  2 - Symptomatic, <50% in bed during the day (Ambulatory and capable of all self care but unable to carry out any work activities. Up and about more than 50% of waking hours)  3 - Symptomatic, >50% in bed, but not bedbound (Capable of only limited self-care, confined to bed or chair 50% or more of waking hours)  4 - Bedbound (Completely disabled. Cannot carry on any self-care. Totally confined to bed or chair)  5 - Death   Eustace Pen MM, Creech RH, Tormey DC, et al. [phone]). "Toxicity and response criteria of the [facility]". [place] Oncol. 5 (6): 649-55   LABORATORY DATA:  Lab Results  Component Value Date   WBC 8.2 12/31/2015   HGB 10.9 (L) 12/31/2015   HCT 35.8 (L) 12/31/2015   MCV 83.6 12/31/2015   PLT 325 12/31/2015   CMP     Component Value Date/Time   NA 142 12/31/2015 2150   NA 142 07/01/2014 0821   K 4.4 12/31/2015 2150   K 3.9 07/01/2014 0821   CL 104 12/31/2015 2150   CO2 30 12/31/2015 2150   CO2 24 07/01/2014 0821   GLUCOSE 99 12/31/2015 2150   GLUCOSE 153 (H) 07/01/2014 0821   BUN 10 12/31/2015 2150   BUN 12.3 07/01/2014 0821   CREATININE 0.77 12/31/2015 2150   CREATININE 0.8 07/01/2014 0821   CALCIUM 10.2 12/31/2015 2150   CALCIUM 9.2 07/01/2014 0821   PROT 8.1 12/31/2015 2150   PROT 7.1 07/01/2014 0821   ALBUMIN 3.4 (L) 12/31/2015 2150   ALBUMIN 3.7 07/01/2014 0821   AST 25 12/31/2015 2150   AST 19 07/01/2014 0821   ALT 24 12/31/2015 2150   ALT 35 07/01/2014 0821   ALKPHOS 107 12/31/2015 2150   ALKPHOS 63 07/01/2014 0821    BILITOT 0.5 12/31/2015 2150   BILITOT 0.27 07/01/2014 0821   GFRNONAA >60 12/31/2015 2150   GFRAA >60 12/31/2015 2150         RADIOGRAPHY: Ct Chest W Contrast  Result Date: 01/01/2016 CLINICAL DATA:  Breast cancer with bony metastases and low back pain. EXAM: CT CHEST, ABDOMEN, AND PELVIS WITH CONTRAST TECHNIQUE: Multidetector CT imaging of the chest, abdomen and pelvis was performed following the standard protocol during bolus administration of intravenous contrast. CONTRAST:  134mL ISOVUE-300 IOPAMIDOL (ISOVUE-300) INJECTION 61% COMPARISON:  None. FINDINGS: CT CHEST FINDINGS Cardiovascular: The heart size is normal. No pericardial effusion. No thoracic aortic aneurysm. Mediastinum/Nodes: No mediastinal lymphadenopathy. There is no hilar lymphadenopathy. There is no axillary lymphadenopathy. The esophagus has normal imaging features. Lungs/Pleura: Post radiation change identified in the subpleural anterior right lung. 2 mm right upper lobe pulmonary nodule is seen on image 41 series for. No other suspicious pulmonary nodule or mass. No focal airspace consolidation. No evidence for pulmonary edema or pleural effusion. Musculoskeletal: 3.5 x 4.3 cm rim enhancing fluid collection medial right breast likely related to lumpectomy. Multiple lytic bone lesions are seen in the thoracic spine CT ABDOMEN PELVIS FINDINGS Hepatobiliary: No focal abnormality within the liver parenchyma. There is no evidence for gallstones, gallbladder wall thickening, or pericholecystic fluid. No intrahepatic or extrahepatic biliary dilation. Pancreas: No focal mass lesion. No dilatation of the main duct. No intraparenchymal cyst. No peripancreatic edema. Spleen: No splenomegaly. No focal mass lesion. Adrenals/Urinary Tract: No adrenal nodule or mass. Small cyst identified upper pole left kidney No evidence for hydroureter. The urinary bladder appears normal for the degree of distention. Stomach/Bowel: Stomach is nondistended. No  gastric wall thickening. No evidence of outlet obstruction. Duodenum is normally positioned as is the ligament of Treitz. No small bowel wall thickening. No small bowel dilatation. The terminal ileum is normal. The appendix is normal. No gross colonic mass. No colonic wall thickening. No substantial diverticular change. Vascular/Lymphatic: No abdominal aortic aneurysm. No abdominal aortic atherosclerotic calcification. There is no gastrohepatic or hepatoduodenal ligament lymphadenopathy. No intraperitoneal or retroperitoneal lymphadenopathy. No pelvic sidewall lymphadenopathy. Reproductive: The uterus has  normal CT imaging appearance. There is no adnexal mass. Other: No intraperitoneal free fluid. Musculoskeletal: Innumerable sclerotic lesions are identified within the bony anatomy. L4 compression fracture better characterized on recent MRI. IMPRESSION: 1. Tiny right upper lobe pulmonary nodule. Continued attention on follow-up imaging recommended. 2. Post radiation changes anterior right lung with focal fluid collection medial right breast likely related to prior lumpectomy. 3. Widespread bony metastatic involvement. Electronically Signed   By: Misty Stanley M.D.   On: 01/01/2016 13:33   Mr Thoracic Spine Wo Contrast  Result Date: 12/31/2015 CLINICAL DATA:  History of breast cancer with unremitting back pain EXAM: MRI THORACIC AND LUMBAR SPINE WITHOUT CONTRAST TECHNIQUE: Multiplanar and multiecho pulse sequences of the thoracic and lumbar spine were obtained without intravenous contrast. COMPARISON:  None. FINDINGS: MRI THORACIC SPINE FINDINGS Alignment:  Physiologic. Vertebrae: Sparing T1 and T2 there are numerous T1 and T2 hypo intense rounded lesions throughout the thoracic bodies and posterior elements. Posterior cortex thinning/breakthrough right paracentral at T11, without neural impingement or measurable epidural mass. Anticipate postcontrast imaging. No visible effacement in the foramina. Cord:  Normal.  Paraspinal and other soft tissues: No visible adenopathy or lung nodule. Disc levels: No notable degenerative changes. MRI LUMBAR SPINE FINDINGS Segmentation:  Standard. Alignment:  Reversal of lordosis due to compression deformity. Vertebrae: Numerous rounded metastases throughout the bodies and posterior elements of the lumbar spine and visualized sacrum. Large lesion infiltrating the L4 body with compression fracture that is acute based on lumbar spine radiography June 2017 and perispinal edema. Height loss measures 60-70% centrally. There is posterior bulging with possible epidural tumor causing high-grade canal stenosis. Postcontrast imaging would better define the extent of extraosseous tumor extension. No gross foraminal impingement. No other compression fracture is noted. Conus medullaris: Extends to the T12/L1 disc level and appears normal. Paraspinal and other soft tissues: Paraspinal edema around the compression fracture Disc levels: L5-S1 left eccentric disc protrusion with left more than right S1 impingement in the subarticular recesses. IMPRESSION: 1. Innumerable osseous metastases with acute pathologic fracture the L4 vertebral body. 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 2. L5-S1 protrusion with left more than right S1 impingement. Electronically Signed   By: Monte Fantasia M.D.   On: 12/31/2015 20:32   Mr Lumbar Spine Wo Contrast  Result Date: 12/31/2015 CLINICAL DATA:  History of breast cancer with unremitting back pain EXAM: MRI THORACIC AND LUMBAR SPINE WITHOUT CONTRAST TECHNIQUE: Multiplanar and multiecho pulse sequences of the thoracic and lumbar spine were obtained without intravenous contrast. COMPARISON:  None. FINDINGS: MRI THORACIC SPINE FINDINGS Alignment:  Physiologic. Vertebrae: Sparing T1 and T2 there are numerous T1 and T2 hypo intense rounded lesions throughout the thoracic bodies and posterior elements. Posterior cortex thinning/breakthrough right  paracentral at T11, without neural impingement or measurable epidural mass. Anticipate postcontrast imaging. No visible effacement in the foramina. Cord:  Normal. Paraspinal and other soft tissues: No visible adenopathy or lung nodule. Disc levels: No notable degenerative changes. MRI LUMBAR SPINE FINDINGS Segmentation:  Standard. Alignment:  Reversal of lordosis due to compression deformity. Vertebrae: Numerous rounded metastases throughout the bodies and posterior elements of the lumbar spine and visualized sacrum. Large lesion infiltrating the L4 body with compression fracture that is acute based on lumbar spine radiography June 2017 and perispinal edema. Height loss measures 60-70% centrally. There is posterior bulging with possible epidural tumor causing high-grade canal stenosis. Postcontrast imaging would better define the extent of extraosseous tumor extension. No gross foraminal impingement. No other  compression fracture is noted. Conus medullaris: Extends to the T12/L1 disc level and appears normal. Paraspinal and other soft tissues: Paraspinal edema around the compression fracture Disc levels: L5-S1 left eccentric disc protrusion with left more than right S1 impingement in the subarticular recesses. IMPRESSION: 1. Innumerable osseous metastases with acute pathologic fracture the L4 vertebral body. 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 2. L5-S1 protrusion with left more than right S1 impingement. Electronically Signed   By: Monte Fantasia M.D.   On: 12/31/2015 20:32   Ct Abdomen Pelvis W Contrast  Result Date: 01/01/2016 CLINICAL DATA:  Breast cancer with bony metastases and low back pain. EXAM: CT CHEST, ABDOMEN, AND PELVIS WITH CONTRAST TECHNIQUE: Multidetector CT imaging of the chest, abdomen and pelvis was performed following the standard protocol during bolus administration of intravenous contrast. CONTRAST:  15mL ISOVUE-300 IOPAMIDOL (ISOVUE-300) INJECTION 61%  COMPARISON:  None. FINDINGS: CT CHEST FINDINGS Cardiovascular: The heart size is normal. No pericardial effusion. No thoracic aortic aneurysm. Mediastinum/Nodes: No mediastinal lymphadenopathy. There is no hilar lymphadenopathy. There is no axillary lymphadenopathy. The esophagus has normal imaging features. Lungs/Pleura: Post radiation change identified in the subpleural anterior right lung. 2 mm right upper lobe pulmonary nodule is seen on image 41 series for. No other suspicious pulmonary nodule or mass. No focal airspace consolidation. No evidence for pulmonary edema or pleural effusion. Musculoskeletal: 3.5 x 4.3 cm rim enhancing fluid collection medial right breast likely related to lumpectomy. Multiple lytic bone lesions are seen in the thoracic spine CT ABDOMEN PELVIS FINDINGS Hepatobiliary: No focal abnormality within the liver parenchyma. There is no evidence for gallstones, gallbladder wall thickening, or pericholecystic fluid. No intrahepatic or extrahepatic biliary dilation. Pancreas: No focal mass lesion. No dilatation of the main duct. No intraparenchymal cyst. No peripancreatic edema. Spleen: No splenomegaly. No focal mass lesion. Adrenals/Urinary Tract: No adrenal nodule or mass. Small cyst identified upper pole left kidney No evidence for hydroureter. The urinary bladder appears normal for the degree of distention. Stomach/Bowel: Stomach is nondistended. No gastric wall thickening. No evidence of outlet obstruction. Duodenum is normally positioned as is the ligament of Treitz. No small bowel wall thickening. No small bowel dilatation. The terminal ileum is normal. The appendix is normal. No gross colonic mass. No colonic wall thickening. No substantial diverticular change. Vascular/Lymphatic: No abdominal aortic aneurysm. No abdominal aortic atherosclerotic calcification. There is no gastrohepatic or hepatoduodenal ligament lymphadenopathy. No intraperitoneal or retroperitoneal lymphadenopathy. No  pelvic sidewall lymphadenopathy. Reproductive: The uterus has normal CT imaging appearance. There is no adnexal mass. Other: No intraperitoneal free fluid. Musculoskeletal: Innumerable sclerotic lesions are identified within the bony anatomy. L4 compression fracture better characterized on recent MRI. IMPRESSION: 1. Tiny right upper lobe pulmonary nodule. Continued attention on follow-up imaging recommended. 2. Post radiation changes anterior right lung with focal fluid collection medial right breast likely related to prior lumpectomy. 3. Widespread bony metastatic involvement. Electronically Signed   By: Misty Stanley M.D.   On: 01/01/2016 13:33      IMPRESSION/PLAN: Today, I talked with Ms Shoemate about the findings and work-up thus far. We discussed the patient's diagnosis of spinal metastases and general treatment for this, highlighting the role of palliative radiotherapy in the management. We discussed the available radiation techniques, and focused on the details of logistics and delivery.    We discussed the risks, benefits, and side effects of radiotherapy. Side effects may include but not necessarily be limited to: skin irritation, fatigue, GI upset, rare internal organ  injury. No guarantees of treatment were given. A consent form was signed and placed in the patient's medical record. The patient was encouraged to ask questions that I answered to the best of my ability.   Given that she is not a surgical candidate, I will deliver radiotherapy to the spine, anticipate treating L2-S2.  We can treat higher in the spine in the future if needed, but I don't want to treat too much bone marrow at once, nor cause severe acute effects.  Will simulate tomorrow, start RT on 9-13.     __________________________________________   [name], MD

## 2016-01-03 ENCOUNTER — Ambulatory Visit
Admission: RE | Admit: 2016-01-03 | Discharge: 2016-01-03 | Disposition: A | Discharge status: Home / Self Care | Payer: Medicaid Other | Source: Ambulatory Visit | Attending: Radiation Oncology | Admitting: Radiation Oncology

## 2016-01-03 DIAGNOSIS — C50919 Malignant neoplasm of unspecified site of unspecified female breast: Secondary | ICD-10-CM

## 2016-01-03 DIAGNOSIS — C50211 Malignant neoplasm of upper-inner quadrant of right female breast: Secondary | ICD-10-CM | POA: Insufficient documentation

## 2016-01-03 LAB — URINE CULTURE: Culture: 50000 — AB

## 2016-01-03 LAB — GLUCOSE, CAPILLARY
GLUCOSE-CAPILLARY: 253 mg/dL — AB (ref 65–99)
GLUCOSE-CAPILLARY: 360 mg/dL — AB (ref 65–99)
Glucose-Capillary: 183 mg/dL — ABNORMAL HIGH (ref 65–99)

## 2016-01-03 LAB — VITAMIN D 25 HYDROXY (VIT D DEFICIENCY, FRACTURES): Vit D, 25-Hydroxy: 36.4 ng/mL (ref 30.0–100.0)

## 2016-01-03 LAB — CANCER ANTIGEN 27.29: CA 27.29: 258.4 U/mL — AB (ref 0.0–38.6)

## 2016-01-03 MED ORDER — SODIUM CHLORIDE 0.9% FLUSH: 10.0000 mL | INTRAVENOUS | Status: DC | PRN

## 2016-01-03 MED ORDER — INSULIN ASPART 100 UNIT/ML ~~LOC~~ SOLN
0.0000 [IU] | Freq: Three times a day (TID) | SUBCUTANEOUS | Status: DC
  Administered 2016-01-03: 11 [IU] via SUBCUTANEOUS
  Administered 2016-01-04: 15 [IU] via SUBCUTANEOUS
  Administered 2016-01-04: 4 [IU] via SUBCUTANEOUS
  Administered 2016-01-04: 7 [IU] via SUBCUTANEOUS
  Administered 2016-01-05: 11 [IU] via SUBCUTANEOUS
  Administered 2016-01-05: 20 [IU] via SUBCUTANEOUS
  Administered 2016-01-05 – 2016-01-06 (×2): 15 [IU] via SUBCUTANEOUS
  Administered 2016-01-06: 11 [IU] via SUBCUTANEOUS

## 2016-01-03 MED ORDER — INSULIN ASPART 100 UNIT/ML ~~LOC~~ SOLN
0.0000 [IU] | Freq: Every day | SUBCUTANEOUS | Status: DC
  Administered 2016-01-03: 5 [IU] via SUBCUTANEOUS
  Administered 2016-01-04: 3 [IU] via SUBCUTANEOUS
  Administered 2016-01-05: 5 [IU] via SUBCUTANEOUS

## 2016-01-03 MED ORDER — OXYCODONE-ACETAMINOPHEN 5-325 MG PO TABS: 1.0000 | ORAL_TABLET | ORAL | Status: DC | PRN

## 2016-01-03 MED ORDER — HYDROMORPHONE HCL 1 MG/ML IJ SOLN
0.5000 mg | INTRAMUSCULAR | Status: DC | PRN
  Administered 2016-01-03: 0.5 mg via INTRAVENOUS
  Filled 2016-01-03 (×2): qty 1

## 2016-01-03 MED ORDER — HYDROMORPHONE HCL 1 MG/ML IJ SOLN
1.0000 mg | Freq: Once | INTRAMUSCULAR | Status: AC
  Administered 2016-01-03: 1 mg via INTRAVENOUS

## 2016-01-03 MED ORDER — OXYCODONE HCL 5 MG PO TABS
5.0000 mg | ORAL_TABLET | ORAL | Status: DC | PRN
  Administered 2016-01-03 – 2016-01-06 (×11): 10 mg via ORAL
  Filled 2016-01-03 (×11): qty 2

## 2016-01-03 NOTE — Progress Notes (Signed)
PROGRESS NOTE    [name]  [id number] DOB: 1975/09/18 DOA: 12/31/2015 PCP: PROVIDER NOT IN SYSTEM   Outpatient Specialists:Dr. Lindi Adie     Brief Narrative:  Debbie Martinez is a 40 y.o. female with medical history significant of breast cancer (s/p of R lumpectomy), HTN, gerd and anxiety, who presents with severe back pain.  Pt states that she has been having back pain since June, which has been progressively getting worse. Currently the back pain is constant, 10 out of 10 severity, radiating to both legs posteriorly. She also has mild leg weakness and numbness on both sides. It is aggravated by movement. Pt denies any recent injury, fall, or MVC. She denies any loss of control over her bowel or bladder. She reports urinary frequency, but no dysuria or burning on urination. No fever, chills, nausea, vomiting, diarrhea, abdominal pain, chest pain, shortness of breath. No lesion change or hearing loss.   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  -Neurosurgeon, Dr. Saintclair Halsted was consulted: lumbar corset--- need to have an idea about her prognosis and life expectancy prior to considering lumbar decompression -Decadron was given in  ED, will continue 4 mg every 4 hour (may need SSI while on steroids) -increase PO meds and decrease IV -When necessary Robaxin for muscle spasm -f/u oncologist recommendation -CT scan abd/pelvis done for staging that showed Widespread bony metastatic involvement -plan for palliative radiation and then systemic therapy with antiestrogen treatments once radiation complete  Breast cancer of upper-inner quadrant of right female breast with metastasis: s/p of right lumpectomy. Patient has been  followed up by Dr. Lindi Adie. Last seen was on 07/07/15. Pt used to be on tamoxifen which was discontinued due to allergic reaction.   HTN: Blood pressure was 138/95 -Continue atenolol  Anxiety:  -continue Valium  Hyperglycemia -SSI while on decadron  DVT prophylaxis:  SCD's  Code Status: Full Code   Family Communication: patient  Disposition Plan:  Home in AM?   Consultants:   NS  oncology  Procedures:        Subjective: Brace delivered today  Objective: Vitals:   01/02/16 1313 01/02/16 1507 01/02/16 2135 01/03/16 0629  BP:  (!) 135/91 138/73 120/71  Pulse:  81 92 67  Resp:  16 16 18   Temp: 99.1 F (37.3 C) 98.3 F (36.8 C) 97.4 F (36.3 C) 98.1 F (36.7 C)  TempSrc: Oral Oral Oral Oral  SpO2:  100% 98% 100%  Weight:      Height:        Intake/Output Summary (Last 24 hours) at 01/03/16 1413 Last data filed at 01/03/16 0926  Gross per 24 hour  Intake              600 ml  Output                8 ml  Net              592 ml   Filed Weights   01/01/16 0059 01/01/16 0528  Weight: 78.8 kg (173 lb 11.6 oz) 78.8 kg (173 lb 11.6 oz)    Examination:  General exam: Appears calm and comfortable  Respiratory system: Clear to auscultation. Respiratory effort normal. Cardiovascular system:  S1 & S2 heard, RRR. No JVD, murmurs, rubs, gallops or clicks. No pedal edema. Gastrointestinal system: Abdomen is nondistended, soft and nontender. No organomegaly or masses felt. Normal bowel sounds heard. Central nervous system: Alert and oriented.     Data Reviewed: I have personally reviewed following labs and imaging studies  CBC:  Recent Labs Lab 12/31/15 2150  WBC 8.2  NEUTROABS 5.4  HGB 10.9*  HCT 35.8*  MCV 83.6  PLT [id number]   Basic Metabolic Panel:  Recent Labs Lab 12/31/15 2150  NA 142  K 4.4  CL 104  CO2 30  GLUCOSE 99  BUN 10  CREATININE 0.77  CALCIUM 10.2   GFR: Estimated Creatinine Clearance: 102.1 mL/min (by C-G formula based  on SCr of 0.8 mg/dL). Liver Function Tests:  Recent Labs Lab 12/31/15 2150  AST 25  ALT 24  ALKPHOS 107  BILITOT 0.5  PROT 8.1  ALBUMIN 3.4*   No results for input(s): LIPASE, AMYLASE in the last 168 hours. No results for input(s): AMMONIA in the last 168 hours. Coagulation Profile:  Recent Labs Lab 12/31/15 0009  INR 1.10   Cardiac Enzymes: No results for input(s): CKTOTAL, CKMB, CKMBINDEX, TROPONINI in the last 168 hours. BNP (last 3 results) No results for input(s): PROBNP in the last 8760 hours. HbA1C: No results for input(s): HGBA1C in the last 72 hours. CBG:  Recent Labs Lab 01/01/16 0816 01/02/16 0828 01/03/16 0748  GLUCAP 221* 213* 183*   Lipid Profile: No results for input(s): CHOL, HDL, LDLCALC, TRIG, CHOLHDL, LDLDIRECT in the last 72 hours. Thyroid Function Tests: No results for input(s): TSH, T4TOTAL, FREET4, T3FREE, THYROIDAB in the last 72 hours. Anemia Panel: No results for input(s): VITAMINB12, FOLATE, FERRITIN, TIBC, IRON, RETICCTPCT in the last 72 hours. Urine analysis:    Component Value Date/Time   COLORURINE YELLOW 12/31/2015 0020   APPEARANCEUR TURBID (A) 12/31/2015 0020   LABSPEC 1.022 12/31/2015 0020   PHURINE 7.0 12/31/2015 0020   GLUCOSEU NEGATIVE 12/31/2015 0020   HGBUR NEGATIVE 12/31/2015 0020   BILIRUBINUR NEGATIVE 12/31/2015 0020   KETONESUR NEGATIVE 12/31/2015 0020   PROTEINUR NEGATIVE 12/31/2015 0020   NITRITE NEGATIVE 12/31/2015 0020   LEUKOCYTESUR SMALL (A) 12/31/2015 0020     ) Recent Results (from the past 240 hour(s))  Urine culture     Status: Abnormal   Collection Time: 12/31/15 12:20 AM  Result Value Ref Range Status   Specimen Description URINE, CLEAN CATCH  Final   Special Requests NONE  Final   Culture (A)  Final    50,000 COLONIES/mL METHICILLIN RESISTANT STAPHYLOCOCCUS AUREUS   Report Status 01/03/2016 FINAL  Final   Organism ID, Bacteria METHICILLIN RESISTANT STAPHYLOCOCCUS AUREUS (A)  Final       Susceptibility   Methicillin resistant staphylococcus aureus - MIC*    CIPROFLOXACIN <=0.5 SENSITIVE Sensitive     GENTAMICIN <=0.5 SENSITIVE Sensitive     NITROFURANTOIN <=16 SENSITIVE Sensitive     OXACILLIN >=4 RESISTANT Resistant     TETRACYCLINE <=1 SENSITIVE Sensitive     VANCOMYCIN <=0.5 SENSITIVE Sensitive     TRIMETH/SULFA <=10 SENSITIVE Sensitive     CLINDAMYCIN <=0.25 SENSITIVE Sensitive     RIFAMPIN <=0.5 SENSITIVE Sensitive     Inducible Clindamycin NEGATIVE Sensitive     * 50,000 COLONIES/mL METHICILLIN RESISTANT STAPHYLOCOCCUS AUREUS      Anti-infectives    None       Radiology Studies: No results found.      Scheduled Meds: . atenolol  50 mg Oral Daily  . cholecalciferol  1,000 Units Oral Daily  . dexamethasone  4 mg Oral Q6H  . multivitamin  15 mL Oral Daily  . polyethylene glycol  17 g Oral Daily  . senna-docusate  1 tablet Oral BID   Continuous Infusions:     LOS: 3 days    Time spent: 25 min    [place], DO [facility] Pager [phone]  If 7PM-7AM, please contact night-coverage [email or web address] Password [facility] 01/03/2016, 2:13 PM

## 2016-01-03 NOTE — Progress Notes (Signed)
  Radiation Oncology         [phone] ________________________________  Name: Debbie Martinez MRN: [id number]  Date: 12/31/2015  DOB: [birth year]  SIMULATION AND TREATMENT PLANNING NOTE  Inpatient  DIAGNOSIS:   Bone metastases C79.51  NARRATIVE:  The patient was brought to the [facility].  Identity was confirmed.  All relevant records and images related to the planned course of therapy were reviewed.  The patient freely provided informed written consent to proceed with treatment after reviewing the details related to the planned course of therapy. The consent form was witnessed and verified by the simulation staff.    Then, the patient was set-up in a stable reproducible  supine position for radiation therapy.  CT images were obtained.  Surface markings were placed.  The CT images were loaded into the planning software.    TREATMENT PLANNING NOTE: Treatment planning then occurred.  The radiation prescription was entered and confirmed.    A total of 6 medically necessary complex treatment devices were fabricated and supervised by me, in the form of 5 fields with MLCs to block bowel, bladder, kidneys AND a vac-lock. MORE FIELDS WITH MLCs MAY BE ADDED IN DOSIMETRY for dose homogeneity.  I have requested : 3D Simulation  I have requested a DVH of the following structures: bowel, bladder, kidneys, cord, CTV.     The patient will receive 35 Gy in 14 fractions to L2-S2. Anticipate starting RT on 01-04-16.   -----------------------------------  [name], MD

## 2016-01-03 NOTE — Progress Notes (Signed)
I called and spoke to Debbie Martinez's nurse Debbie Martinez on [place]. I informed her that Debbie Martinez is scheduled for CT Simulation at 11:00 today here in radiation oncology. She voiced her understanding, and will plan to premedicate her with pain medicine before she is transported to Radiation Oncology. I have called CT Simulation and made them aware.

## 2016-01-03 NOTE — Progress Notes (Signed)
Lumbar brace delivered, at bedside, patient was instructed how to use it.

## 2016-01-03 NOTE — Progress Notes (Signed)
[employer] called to inform them about the lumbar brace that the patient needs.

## 2016-01-03 NOTE — Evaluation (Signed)
Occupational Therapy Evaluation Patient Details Name: Debbie Martinez MRN: [id number] DOB: 07-23-1975 Today's Date: 01/03/2016    History of Present Illness 40 yo female admitted with severe back pain. MRI + mets to spine, pathologic L4 fx. Hx of breast cancer, HTN   Clinical Impression   Pt was admitted for the above. She will benefit from continued OT in acute to increase safety and independence with adls. Goals are for supervision level.  Pt had needed assistance with adls from 35 y o daughter prior to admission. Issued AE to help pt regain independence and goals are set for supervision level    Follow Up Recommendations  Supervision - Intermittent to 24/7, depending upon progress   Equipment Recommendations  Tub/shower bench;3 in 1 bedside comode    Recommendations for Other Services       Precautions / Restrictions Precautions Precautions: Fall Precaution Comments: logroll and back precautions for comfort Required Braces or Orthoses: Spinal Brace (corsett) Spinal Brace: Applied in sitting position Restrictions Weight Bearing Restrictions: No      Mobility Bed Mobility     Rolling: Supervision Sidelying to sit: Supervision       General bed mobility comments: cues and extra time  Transfers   Equipment used: Rolling walker (2 wheeled);None Transfers: Sit to/from Stand Sit to Stand: Min guard         General transfer comment: for safety; cues to keep back straight    Balance                                            ADL Overall ADL's : Needs assistance/impaired     Grooming: Set up;Sitting   Upper Body Bathing: Set up;Sitting   Lower Body Bathing: Min guard;Sit to/from stand;With adaptive equipment   Upper Body Dressing : Set up;Sitting   Lower Body Dressing: Min guard;Sit to/from stand;With adaptive equipment   Toilet Transfer: Min guard;Stand-pivot;RW (chair)             General ADL Comments: issued and educated  on reacher, sock aide, long sponge, long shoehorn, toilet aide and leg lifter (for car and tub bench transfers.  Recommended tub bench and 3:1 commode. Pt also needs to get a bed. She has just moved here and will likely need to hire someone to get her things into her apt.  Her 64 year old daughter has been helping her extensively.  Daughter is with her father right now     Vision     Perception     Praxis      Pertinent Vitals/Pain Pain Assessment: Faces Faces Pain Scale: Hurts a little bit Pain Location: back Pain Intervention(s): Limited activity within patient's tolerance;Monitored during session;Premedicated before session;Repositioned     Hand Dominance Right   Extremity/Trunk Assessment Upper Extremity Assessment Upper Extremity Assessment: Overall WFL for tasks assessed           Communication Communication Communication: No difficulties   Cognition Arousal/Alertness: Awake/alert Behavior During Therapy: WFL for tasks assessed/performed Overall Cognitive Status: Within Functional Limits for tasks assessed                     General Comments       Exercises       Shoulder Instructions      Home Living Family/patient expects to be discharged to:: Private residence Living Arrangements: Children (9 y  o )   Type of Home: Apartment   Entrance Stairs-Number of Steps: lives on 3rd level so 2 flights of steps         Bathroom Shower/Tub: Tub/shower unit Shower/tub characteristics: Architectural technologist: Standard         Additional Comments: just moved here; has been sleeping on air mattress on floor      Prior Functioning/Environment Level of Independence: Needs assistance        Comments: pt reports 56 yo daughter has been assisting her since june    OT Diagnosis: Acute pain   OT Problem List: Decreased strength;Decreased activity tolerance;Decreased knowledge of use of DME or AE;Decreased knowledge of precautions;Pain   OT  Treatment/Interventions: Self-care/ADL training;DME and/or AE instruction;Patient/family education;Therapeutic activities    OT Goals(Current goals can be found in the care plan section) Acute Rehab OT Goals Patient Stated Goal: regain PLOF, independence OT Goal Formulation: With patient Time For Goal Achievement: 01/10/16 Potential to Achieve Goals: Good ADL Goals Pt Will Perform Lower Body Bathing: with supervision;with adaptive equipment;sit to/from stand Pt Will Perform Lower Body Dressing: with supervision;with adaptive equipment;sit to/from stand Pt Will Transfer to Toilet: with supervision;ambulating;bedside commode Pt Will Perform Toileting - Clothing Manipulation and hygiene: with supervision;with adaptive equipment;sit to/from stand Pt Will Perform Tub/Shower Transfer: Tub transfer;ambulating;tub bench;rolling walker (and leg lifter)  OT Frequency: Min 2X/week   Barriers to D/C:            Co-evaluation              End of Session    Activity Tolerance: Patient tolerated treatment well Patient left: in chair;with call bell/phone within reach;with chair alarm set   Time: [id number] OT Time Calculation (min): 26 min Charges:  OT General Charges $OT Visit: 1 Procedure OT Evaluation $OT Eval Low Complexity: 1 Procedure OT Treatments $Self Care/Home Management : 8-22 mins G-Codes:    Lavanna Rog 2016-01-23, 10:04 AM Lesle Chris, OTR/L [phone] 2016-01-23

## 2016-01-04 ENCOUNTER — Encounter (HOSPITAL_COMMUNITY): Payer: Self-pay | Admitting: *Deleted

## 2016-01-04 ENCOUNTER — Ambulatory Visit
Admit: 2016-01-04 | Discharge: 2016-01-04 | Disposition: A | Discharge status: Home / Self Care | Payer: Medicaid Other | Attending: Radiation Oncology | Admitting: Radiation Oncology

## 2016-01-04 DIAGNOSIS — I10 Essential (primary) hypertension: Secondary | ICD-10-CM

## 2016-01-04 DIAGNOSIS — M5440 Lumbago with sciatica, unspecified side: Secondary | ICD-10-CM

## 2016-01-04 DIAGNOSIS — C50211 Malignant neoplasm of upper-inner quadrant of right female breast: Secondary | ICD-10-CM

## 2016-01-04 LAB — BASIC METABOLIC PANEL
ANION GAP: 7 (ref 5–15)
BUN: 12 mg/dL (ref 6–20)
CO2: 27 mmol/L (ref 22–32)
Calcium: 9.1 mg/dL (ref 8.9–10.3)
Chloride: 98 mmol/L — ABNORMAL LOW (ref 101–111)
Creatinine, Ser: 0.65 mg/dL (ref 0.44–1.00)
GFR calc Af Amer: >60 mL/min (ref >60)
Glucose, Bld: 264 mg/dL — ABNORMAL HIGH (ref 65–99)
POTASSIUM: 4.2 mmol/L (ref 3.5–5.1)
SODIUM: 132 mmol/L — AB (ref 135–145)

## 2016-01-04 LAB — GLUCOSE, CAPILLARY
GLUCOSE-CAPILLARY: 239 mg/dL — AB (ref 65–99)
GLUCOSE-CAPILLARY: 197 mg/dL — AB (ref 65–99)
GLUCOSE-CAPILLARY: 281 mg/dL — AB (ref 65–99)
Glucose-Capillary: 310 mg/dL — ABNORMAL HIGH (ref 65–99)

## 2016-01-04 LAB — CBC
HCT: 30.3 % — ABNORMAL LOW (ref 36.0–46.0)
Hemoglobin: 9.6 g/dL — ABNORMAL LOW (ref 12.0–15.0)
MCH: 25.8 pg — ABNORMAL LOW (ref 26.0–34.0)
MCHC: 31.7 g/dL (ref 30.0–36.0)
MCV: 81.5 fL (ref 78.0–100.0)
PLATELETS: 278 10*3/uL (ref 150–400)
RBC: 3.72 MIL/uL — AB (ref 3.87–5.11)
RDW: 14.5 % (ref 11.5–15.5)
WBC: 24.5 10*3/uL — AB (ref 4.0–10.5)

## 2016-01-04 MED ORDER — INSULIN GLARGINE 100 UNIT/ML ~~LOC~~ SOLN
10.0000 [IU] | Freq: Two times a day (BID) | SUBCUTANEOUS | Status: DC
  Administered 2016-01-04 – 2016-01-06 (×5): 10 [IU] via SUBCUTANEOUS
  Filled 2016-01-04 (×6): qty 0.1

## 2016-01-04 MED ORDER — OXYCODONE HCL 5 MG PO TABS
10.0000 mg | ORAL_TABLET | Freq: Once | ORAL | Status: AC
  Administered 2016-01-04: 10 mg via ORAL
  Filled 2016-01-04: qty 2

## 2016-01-04 MED ORDER — POLYETHYLENE GLYCOL 3350 17 G PO PACK: 17.0000 g | PACK | Freq: Every day | ORAL | Status: DC

## 2016-01-04 NOTE — Progress Notes (Signed)
[facility]iation Oncology Dept Therapy Treatment Record Phone [phone]   Radiation Therapy was administered to Debbie Martinez on: 01/04/2016  2:52 PM and was treatment #1 out of a planned course of 14 treatments.  Radiation Treatment  1). Beam photons with 6-10 energy  2). Brachytherapy None and None  3). Stereotactic Radiosurgery None  4). Other Radiation None and None     Cheral Cappucci L, RT (T)

## 2016-01-04 NOTE — Progress Notes (Signed)
Physical Therapy Treatment Patient Details Name: Debbie Martinez MRN: [id number] DOB: [birth year] Today's Date: 01/04/2016    History of Present Illness 40 yo female admitted with severe back pain. MRI + mets to spine, pathologic L4 fx. Hx of breast cancer, HTN    PT Comments    The pt tolerated walking in hallway well and reported decreased pain when upright and moving. Pt was assisted to chair for lunch after walking. Pt would benefit from practicing safe stair technique as she reports recently moving to a 3rd floor apartment with no accessible elevator.  Follow Up Recommendations  Home health PT;Supervision - Intermittent     Equipment Recommendations  Rolling walker with 5" wheels    Recommendations for Other Services       Precautions / Restrictions Precautions Precautions: Fall Precaution Comments: logroll and back precautions for comfort Required Braces or Orthoses: Spinal Brace (for lumbar support) Spinal Brace: Applied in sitting position Restrictions Weight Bearing Restrictions: No    Mobility  Bed Mobility Overal bed mobility: Needs Assistance Bed Mobility: Rolling;Sidelying to Sit;Supine to Sit Rolling: Min guard Sidelying to sit: Min guard Supine to sit: Min guard     General bed mobility comments: guarding for safety; verbal cues to adhere to spinal precautions  Transfers Overall transfer level: Needs assistance Equipment used: Rolling walker (2 wheeled) Transfers: Sit to/from Stand Sit to Stand: Min guard         General transfer comment: guarding for safety; cues to maintain neutral spinal posture  Ambulation/Gait Ambulation/Gait assistance: Min guard Ambulation Distance (Feet): 400 Feet Assistive device: Rolling walker (2 wheeled) Gait Pattern/deviations: Step-through pattern     General Gait Details: guarding for safety in case of muscle spasm or change in symptoms; pt moved at slower pace and tolerated increased gait distance  well   Stairs            Wheelchair Mobility    Modified Rankin (Stroke Patients Only)       Balance                                    Cognition Arousal/Alertness: Awake/alert Behavior During Therapy: WFL for tasks assessed/performed Overall Cognitive Status: Within Functional Limits for tasks assessed                      Exercises      General Comments        Pertinent Vitals/Pain Pain Assessment: 0-10 Pain Score: 4  Pain Location: low back with activity; occasional muscle spasms with pain radiating from upper back to post thighs Pain Descriptors / Indicators: Discomfort;Aching Pain Intervention(s): Limited activity within patient's tolerance;Monitored during session;Premedicated before session;Repositioned (with lumbar brace applied)    Home Living                      Prior Function            PT Goals (current goals can now be found in the care plan section) Progress towards PT goals: Progressing toward goals    Frequency  Min 3X/week    PT Plan Current plan remains appropriate    Co-evaluation             End of Session Equipment Utilized During Treatment: Back brace (for lumbar spine) Activity Tolerance: Patient tolerated treatment well Patient left: in chair;with call bell/phone within reach;with chair alarm set  Time: [id number] PT Time Calculation (min) (ACUTE ONLY): 20 min  Charges:  $Gait Training: 8-22 mins                    G Codes:      Dewitt Hoes 01-17-2016, 1:06 PM Dewitt Hoes, SPT

## 2016-01-04 NOTE — Progress Notes (Signed)
Inpatient Diabetes Program Recommendations  AACE/ADA: New Consensus Statement on Inpatient Glycemic Control (2015)  Target Ranges:  Prepandial:   less than 140 mg/dL      Peak postprandial:   less than 180 mg/dL (1-2 hours)      Critically ill patients:  140 - 180 mg/dL   Lab Results  Component Value Date   [id number] (H) 01/04/2016    Review of Glycemic Control  Diabetes history: None Outpatient Diabetes medications: N/A Current orders for Inpatient glycemic control: Lantus 10 units bid, Novolog resistant tidwc and hs  HgbA1C pending.  On Decadron 4 mg Q6H.  Inpatient Diabetes Program Recommendations:    Follow blood sugars daily.  May need meal coverage insulin if pt eating > 50% meals Follow-up with HgbA1C results.  Thank you. Lorenda Peck, RD, LDN, CDE Inpatient Diabetes Coordinator [phone]

## 2016-01-04 NOTE — Progress Notes (Signed)
[facility] PROGRESS NOTE    Progress Note  Debbie Martinez  [id number] DOB: 11-02-1975 DOA: 12/31/2015 PCP: PROVIDER NOT IN SYSTEM     Brief Narrative:   Debbie Martinez is an 40 y.o. female past medical history stage IV breast cancer status post lumpectomy who came into the ED with severe back pain, CT scan of the lumbar spine showed metastatic disease with pathologic burst fracture, which neurosurgery was consulted who recommended a lumbar corset, IV Decadron and radiation oncology consultation for radiation treatment. She was started on IV narcotics and Robaxin oncology was consulted who recommended systemic hormonal therapy as an outpatient once radiation is completed.   Assessment/Plan:   Back pain due to  Breast carcinoma metastatic to multiple sites with burst compression fracture of L4: Discussed with neurosurgery which recommended no surgical intervention. They recommended course of IV radiation and IV steroids. Currently her pain is well controlled. Oncology was consulted recommended antiestrogen therapy once radiation is completed. CT of the abdomen and pelvis showed widespread metastatic bony involvement. She had been taking tamoxifen earlier in the year but it was discontinued due to allergies.  Essential hypertension: Blood pressure stable continue atenolol.  Anxiety: Continue benzodiazepines.  Hyperglycemia: Likely due to Decadron, will start on long-acting insulin. Will likely go home on metformin while on decadron. Check a hemoglobin A1c.  Constipation: Likely due to narcotics start her on MiraLAX daily.  DVT prophylaxis: lovenox Family Communication:none Disposition Plan/Barrier to D/C: probably home in am Code Status:     Code Status Orders        Start     Ordered   12/31/15 2253  Full code  Continuous     12/31/15 2252    Code Status History    Date Active Date Inactive Code Status Order ID Comments User Context   12/31/2015  10:53 PM 01/02/2016 11:00 AM Full Code [id number]  Ivor Costa, MD ED        IV Access:    Peripheral IV   Procedures and diagnostic studies:   No results found.   Medical Consultants:    None.  Anti-Infectives:   none  Subjective:    [name] She relates her pain is well controlled, her last bowel movement was 2 days ago.  Objective:    Vitals:   01/03/16 0629 01/03/16 1417 01/03/16 2119 01/04/16 0434  BP: 120/71 130/90 (!) 141/97 (!) 145/96  Pulse: 67 77 84 95  Resp: 18 18 20 20   Temp: 98.1 F (36.7 C) 98.5 F (36.9 C) 98.7 F (37.1 C) 99.2 F (37.3 C)  TempSrc: Oral Oral Oral Oral  SpO2: 100% 98% 99% 99%  Weight:      Height:        Intake/Output Summary (Last 24 hours) at 01/04/16 1030 Last data filed at 01/03/16 1551  Gross per 24 hour  Intake              240 ml  Output                0 ml  Net              240 ml   Filed Weights   01/01/16 0059 01/01/16 0528  Weight: 78.8 kg (173 lb 11.6 oz) 78.8 kg (173 lb 11.6 oz)    Exam: General exam: In no acute distress. Respiratory system: Good air movement and clear to auscultation. Cardiovascular system: S1 & S2 heard, RRR. No JVD, murmurs, rubs, gallops or  clicks.  Gastrointestinal system: Abdomen is nondistended, soft and nontender.  Central nervous system: Alert and oriented. No focal neurological deficits. Extremities: No pedal edema. Skin: No rashes, lesions or ulcers Psychiatry: Judgement and insight appear normal. Mood & affect appropriate.    Data Reviewed:    Labs: Basic Metabolic Panel:  Recent Labs Lab 12/31/15 2150 01/04/16 0504  NA 142 132*  K 4.4 4.2  CL 104 98*  CO2 30 27  GLUCOSE 99 264*  BUN 10 12  CREATININE 0.77 0.65  CALCIUM 10.2 9.1   GFR Estimated Creatinine Clearance: 102.1 mL/min (by C-G formula based on SCr of 0.65 mg/dL). Liver Function Tests:  Recent Labs Lab 12/31/15 2150  AST 25  ALT 24  ALKPHOS 107  BILITOT 0.5  PROT 8.1    ALBUMIN 3.4*   No results for input(s): LIPASE, AMYLASE in the last 168 hours. No results for input(s): AMMONIA in the last 168 hours. Coagulation profile  Recent Labs Lab 12/31/15 0009  INR 1.10    CBC:  Recent Labs Lab 12/31/15 2150 01/04/16 0504  WBC 8.2 24.5*  NEUTROABS 5.4  --   HGB 10.9* 9.6*  HCT 35.8* 30.3*  MCV 83.6 81.5  PLT 325 278   Cardiac Enzymes: No results for input(s): CKTOTAL, CKMB, CKMBINDEX, TROPONINI in the last 168 hours. BNP (last 3 results) No results for input(s): PROBNP in the last 8760 hours. CBG:  Recent Labs Lab 01/01/16 0816 01/02/16 0828 01/03/16 0748 01/03/16 1657 01/03/16 2117  GLUCAP 221* 213* 183* 253* 360*   D-Dimer: No results for input(s): DDIMER in the last 72 hours. Hgb A1c: No results for input(s): HGBA1C in the last 72 hours. Lipid Profile: No results for input(s): CHOL, HDL, LDLCALC, TRIG, CHOLHDL, LDLDIRECT in the last 72 hours. Thyroid function studies: No results for input(s): TSH, T4TOTAL, T3FREE, THYROIDAB in the last 72 hours.  Invalid input(s): FREET3 Anemia work up: No results for input(s): VITAMINB12, FOLATE, FERRITIN, TIBC, IRON, RETICCTPCT in the last 72 hours. Sepsis Labs:  Recent Labs Lab 12/31/15 2150 01/04/16 0504  WBC 8.2 24.5*   Microbiology Recent Results (from the past 240 hour(s))  Urine culture     Status: Abnormal   Collection Time: 12/31/15 12:20 AM  Result Value Ref Range Status   Specimen Description URINE, CLEAN CATCH  Final   Special Requests NONE  Final   Culture (A)  Final    50,000 COLONIES/mL METHICILLIN RESISTANT STAPHYLOCOCCUS AUREUS   Report Status 01/03/2016 FINAL  Final   Organism ID, Bacteria METHICILLIN RESISTANT STAPHYLOCOCCUS AUREUS (A)  Final      Susceptibility   Methicillin resistant staphylococcus aureus - MIC*    CIPROFLOXACIN <=0.5 SENSITIVE Sensitive     GENTAMICIN <=0.5 SENSITIVE Sensitive     NITROFURANTOIN <=16 SENSITIVE Sensitive     OXACILLIN >=4  RESISTANT Resistant     TETRACYCLINE <=1 SENSITIVE Sensitive     VANCOMYCIN <=0.5 SENSITIVE Sensitive     TRIMETH/SULFA <=10 SENSITIVE Sensitive     CLINDAMYCIN <=0.25 SENSITIVE Sensitive     RIFAMPIN <=0.5 SENSITIVE Sensitive     Inducible Clindamycin NEGATIVE Sensitive     * 50,000 COLONIES/mL METHICILLIN RESISTANT STAPHYLOCOCCUS AUREUS     Medications:   . atenolol  50 mg Oral Daily  . cholecalciferol  1,000 Units Oral Daily  . dexamethasone  4 mg Oral Q6H  . insulin aspart  0-20 Units Subcutaneous TID WC  . insulin aspart  0-5 Units Subcutaneous QHS  . insulin glargine  10 Units Subcutaneous BID  . multivitamin  15 mL Oral Daily  . polyethylene glycol  17 g Oral Daily  . senna-docusate  1 tablet Oral BID   Continuous Infusions:   Time spent: 15 min   LOS: 4 days   Charlynne Cousins  [facility] Pager [phone]  *Please refer to [email or web address], password [email or web address] to get updated schedule on who will round on this patient, as hospitalists switch teams weekly. If 7PM-7AM, please contact night-coverage at [email or web address], password [email or web address] for any overnight needs.  01/04/2016, 10:30 AM

## 2016-01-04 NOTE — Care Management Note (Signed)
Case Management Note  Patient Details  Name: GUILA HOMOLA MRN: [id number] Date of Birth: Mar 28, 1976  Subjective/Objective:  40 yo admitted with back pain. Hx of breast CA                  Action/Plan: From home with daughter. PT is recommending HHPT. Pt has Medicaid and can not receive HHPT for her diagnosis. HHRN for safety eval ordered. Pt also with orders for hospital bed, RW and 3 in 1. Choice offered for [facility] services. [facility] was chosen. [facility] HH rep called for referral. [place] DME rep called for equipment. No other CM needs communicated. CM will continue to follow.  Expected Discharge Date:                  Expected Discharge Plan:  [place]  In-House Referral:     Discharge planning Services  CM Consult  Post Acute Care Choice:    Choice offered to:  Patient  DME Arranged:  Hospital bed, Walker rolling, 3-N-1 DME Agency:  [place]:  RN [facility] Agency:  [place]  Status of Service:  In process, will continue to follow  If discussed at Long Length of Stay Meetings, dates discussed:    Additional CommentsLynnell Catalan, RN 01/04/2016, 10:34 AM [phone]

## 2016-01-04 NOTE — Progress Notes (Signed)
Occupational Therapy Treatment Patient Details Name: Debbie Martinez MRN: [id number] DOB: [birth year] Today's Date: 01/04/2016    History of present illness 40 yo female admitted with severe back pain. MRI + mets to spine, pathologic L4 fx. Hx of breast cancer, HTN   OT comments  Pt performed adl and used reacher and toilet aide. She has a new brace and needs reinforcement with donning this  Follow Up Recommendations  Supervision - Intermittent    Equipment Recommendations  Tub/shower bench;3 in 1 bedside comode    Recommendations for Other Services      Precautions / Restrictions Precautions Precautions: Fall Precaution Comments: logroll and back precautions for comfort Required Braces or Orthoses: Spinal Brace Spinal Brace: Applied in sitting position Restrictions Weight Bearing Restrictions: No       Mobility Bed Mobility Overal bed mobility: Needs Assistance Bed Mobility: Rolling;Sidelying to Sit;Supine to Sit Rolling: Min guard Sidelying to sit: Min guard Supine to sit: Min guard   Sit to sidelying: Min assist General bed mobility comments: light assistance for legs; cues for technique  Transfers Overall transfer level: Needs assistance Equipment used: Rolling walker (2 wheeled) Transfers: Sit to/from Stand Sit to Stand: Supervision         General transfer comment: cues for back precautions    Balance                                   ADL       Grooming: Wash/dry hands;Supervision/safety;Standing   Upper Body Bathing: Set up;Sitting       Upper Body Dressing : Set up;Sitting   Lower Body Dressing: Supervision/safety;Sit to/from stand;With adaptive equipment (pants with reacher)   Toilet Transfer: Min guard;Ambulation;BSC;RW   Toileting- Clothing Manipulation and Hygiene: Supervision/safety;Sit to/from stand         General ADL Comments: performed adl in bathroom; did not wash lower legs nor change socks.  Pt has a new  back brace:  donned with mod A; this is helping her back pain per her report. Used reacher and toilet aide this visit      Tourist information centre manager   Behavior During Therapy: WFL for tasks assessed/performed Overall Cognitive Status: Within Functional Limits for tasks assessed                       Extremity/Trunk Assessment               Exercises     Shoulder Instructions       General Comments      Pertinent Vitals/ Pain       Pain Assessment: 0-10 Pain Score: 2  Pain Location: low back Pain Descriptors / Indicators: Sore Pain Intervention(s): Limited activity within patient's tolerance;Monitored during session;Premedicated before session;Repositioned  Home Living                                          Prior Functioning/Environment              Frequency Min 2X/week     Progress Toward Goals  OT Goals(current goals can now be found in the care plan section)  Progress towards OT goals: Progressing toward goals     Plan      Co-evaluation                 End of Session     Activity Tolerance Patient tolerated treatment well   Patient Left in bed;with call bell/phone within reach;with bed alarm set   Nurse Communication          Time: [id number] OT Time Calculation (min): 29 min  Charges: OT General Charges $OT Visit: 1 Procedure OT Treatments $Self Care/Home Management : 23-37 mins  Malayna Noori 01/04/2016, 2:48 PM  Lesle Chris, OTR/L [phone] 01/04/2016

## 2016-01-05 ENCOUNTER — Ambulatory Visit
Admit: 2016-01-05 | Discharge: 2016-01-05 | Disposition: A | Discharge status: Home / Self Care | Payer: Medicaid Other | Attending: Radiation Oncology | Admitting: Radiation Oncology

## 2016-01-05 LAB — GLUCOSE, CAPILLARY
GLUCOSE-CAPILLARY: 266 mg/dL — AB (ref 65–99)
Glucose-Capillary: 397 mg/dL — ABNORMAL HIGH (ref 65–99)
Glucose-Capillary: 326 mg/dL — ABNORMAL HIGH (ref 65–99)
Glucose-Capillary: 397 mg/dL — ABNORMAL HIGH (ref 65–99)

## 2016-01-05 LAB — HEMOGLOBIN A1C
Hgb A1c MFr Bld: 6.1 % — ABNORMAL HIGH (ref 4.8–5.6)
MEAN PLASMA GLUCOSE: 128 mg/dL

## 2016-01-05 MED ORDER — INSULIN GLARGINE 100 UNIT/ML ~~LOC~~ SOLN: 10.0000 [IU] | Freq: Two times a day (BID) | SUBCUTANEOUS | 11 refills | Status: DC

## 2016-01-05 MED ORDER — OXYCODONE-ACETAMINOPHEN 5-325 MG PO TABS: 1.0000 | ORAL_TABLET | Freq: Four times a day (QID) | ORAL | 0 refills | Status: DC | PRN

## 2016-01-05 MED ORDER — POLYETHYLENE GLYCOL 3350 17 G PO PACK
17.0000 g | PACK | Freq: Every day | ORAL | 0 refills | Status: AC
Start: 2016-01-05 — End: ?

## 2016-01-05 MED ORDER — DEXAMETHASONE 4 MG PO TABS: 4.0000 mg | ORAL_TABLET | Freq: Three times a day (TID) | ORAL | 0 refills | Status: DC

## 2016-01-05 NOTE — Discharge Summary (Addendum)
Physician Discharge Summary  JOURNEY LOCH [id number] DOB: [birth year] DOA: 12/31/2015  PCP: PROVIDER NOT IN SYSTEM  Admit date: 12/31/2015 Discharge date: 01/06/2016  Admitted From: home Disposition:  Home  Recommendations for Outpatient Follow-up:  1. Follow up with PCP in 1-2 weeks 2. Please obtain BMP/CBC in one week 3. Please follow up on the following pending results:  Home Health: Yes  Equipment/Devices:No  Discharge Condition:stable CODE STATUS:full Diet recommendation:  Regular  Brief/Interim Summary: 40 year old with past medical history of metastatic breast cancer status post lumpectomy comes in with severe back pain, CT scan of the lumbar spine showed metastatic disease with a pathologic burst fracture neurosurgery was consulted who recommended no surgical intervention and a lumbar corset  Discharge Diagnoses:  Back pain due to metastatic carcinoma with burst compression fracture of L4: No surgery was consulted who recommended no surgical intervention they recommended, radiation IV steroids and a corset. He was started on radiation therapy which improved her pain. Oncology was consulted recommended antiestrogenic therapy as an outpatient when she is finished with radiation therapy. Physical therapy evaluated the patient and recommended a walker with home health. She will go home a short course of steroids Decadron orally and narcotic short course of narcotics.  Essential hypertension: No changes were made.  Anxiety: Continue benzodiazepine no changes were made.  Hypoglycemia: Likely due to Decadron, she was started on long-acting insulin which she will continue at home. She will start metformin orally as an outpatient, she will cont until she is on steroids. A hemoglobin A1c will be follow-up by her primary care doctor.  Constipation: Likely due to narcotics this resolved with MiraLAX.  Anxiety    Discharge Instructions  Discharge Instructions     Diet - low sodium heart healthy    Complete by:  As directed    Diet - low sodium heart healthy    Complete by:  As directed    Increase activity slowly    Complete by:  As directed    Increase activity slowly    Complete by:  As directed        Medication List    STOP taking these medications   ibuprofen 600 MG tablet Commonly known as:  ADVIL,MOTRIN     TAKE these medications   atenolol 50 MG tablet Commonly known as:  TENORMIN Take 50 mg by mouth daily.   dexamethasone 4 MG tablet Commonly known as:  DECADRON Take 1 tablet (4 mg total) by mouth 3 (three) times daily.   diazepam 5 MG tablet Commonly known as:  VALIUM Take 1 tablet (5 mg total) by mouth 2 (two) times daily. What changed:  when to take this  reasons to take this   DOANS PILLS PO Take 2 tablets by mouth daily as needed (for pain).   ENSURE Take 237 mLs by mouth 2 (two) times daily between meals.   Geritol Liqd Take 5 mLs by mouth daily.   metFORMIN 500 MG tablet Commonly known as:  GLUCOPHAGE Take 1 tablet (500 mg total) by mouth 2 (two) times daily with a meal.   methocarbamol 500 MG tablet Commonly known as:  ROBAXIN Take 1 tablet (500 mg total) by mouth 2 (two) times daily as needed for muscle spasms. What changed:  how much to take  when to take this   oxyCODONE-acetaminophen 5-325 MG tablet Commonly known as:  PERCOCET/ROXICET Take 1 tablet by mouth every 6 (six) hours as needed for severe pain.   polyethylene glycol packet Commonly  known as:  MIRALAX / GLYCOLAX Take 17 g by mouth daily.   tamoxifen 20 MG tablet Commonly known as:  NOLVADEX Take 1 tablet (20 mg total) by mouth daily.   traMADol 50 MG tablet Commonly known as:  ULTRAM Take 50-100 mg by mouth every 6 (six) hours as needed (for pain).   VITAMIN D-3 PO Take 1 capsule by mouth daily.       Allergies  Allergen Reactions  . Lisinopril Swelling and Cough    Swelling under eyes and eyelids    . Tamoxifen  Itching    Consultations:  Oncology  Radiation oncology   Procedures/Studies: Ct Chest W Contrast  Result Date: 01/01/2016 CLINICAL DATA:  Breast cancer with bony metastases and low back pain. EXAM: CT CHEST, ABDOMEN, AND PELVIS WITH CONTRAST TECHNIQUE: Multidetector CT imaging of the chest, abdomen and pelvis was performed following the standard protocol during bolus administration of intravenous contrast. CONTRAST:  1100mL ISOVUE-300 IOPAMIDOL (ISOVUE-300) INJECTION 61% COMPARISON:  None. FINDINGS: CT CHEST FINDINGS Cardiovascular: The heart size is normal. No pericardial effusion. No thoracic aortic aneurysm. Mediastinum/Nodes: No mediastinal lymphadenopathy. There is no hilar lymphadenopathy. There is no axillary lymphadenopathy. The esophagus has normal imaging features. Lungs/Pleura: Post radiation change identified in the subpleural anterior right lung. 2 mm right upper lobe pulmonary nodule is seen on image 41 series for. No other suspicious pulmonary nodule or mass. No focal airspace consolidation. No evidence for pulmonary edema or pleural effusion. Musculoskeletal: 3.5 x 4.3 cm rim enhancing fluid collection medial right breast likely related to lumpectomy. Multiple lytic bone lesions are seen in the thoracic spine CT ABDOMEN PELVIS FINDINGS Hepatobiliary: No focal abnormality within the liver parenchyma. There is no evidence for gallstones, gallbladder wall thickening, or pericholecystic fluid. No intrahepatic or extrahepatic biliary dilation. Pancreas: No focal mass lesion. No dilatation of the main duct. No intraparenchymal cyst. No peripancreatic edema. Spleen: No splenomegaly. No focal mass lesion. Adrenals/Urinary Tract: No adrenal nodule or mass. Small cyst identified upper pole left kidney No evidence for hydroureter. The urinary bladder appears normal for the degree of distention. Stomach/Bowel: Stomach is nondistended. No gastric wall thickening. No evidence of outlet obstruction.  Duodenum is normally positioned as is the ligament of Treitz. No small bowel wall thickening. No small bowel dilatation. The terminal ileum is normal. The appendix is normal. No gross colonic mass. No colonic wall thickening. No substantial diverticular change. Vascular/Lymphatic: No abdominal aortic aneurysm. No abdominal aortic atherosclerotic calcification. There is no gastrohepatic or hepatoduodenal ligament lymphadenopathy. No intraperitoneal or retroperitoneal lymphadenopathy. No pelvic sidewall lymphadenopathy. Reproductive: The uterus has normal CT imaging appearance. There is no adnexal mass. Other: No intraperitoneal free fluid. Musculoskeletal: Innumerable sclerotic lesions are identified within the bony anatomy. L4 compression fracture better characterized on recent MRI. IMPRESSION: 1. Tiny right upper lobe pulmonary nodule. Continued attention on follow-up imaging recommended. 2. Post radiation changes anterior right lung with focal fluid collection medial right breast likely related to prior lumpectomy. 3. Widespread bony metastatic involvement. Electronically Signed   By: Misty Stanley M.D.   On: 01/01/2016 13:33   Mr Thoracic Spine Wo Contrast  Result Date: 12/31/2015 CLINICAL DATA:  History of breast cancer with unremitting back pain EXAM: MRI THORACIC AND LUMBAR SPINE WITHOUT CONTRAST TECHNIQUE: Multiplanar and multiecho pulse sequences of the thoracic and lumbar spine were obtained without intravenous contrast. COMPARISON:  None. FINDINGS: MRI THORACIC SPINE FINDINGS Alignment:  Physiologic. Vertebrae: Sparing T1 and T2 there are numerous T1 and T2 hypo intense  rounded lesions throughout the thoracic bodies and posterior elements. Posterior cortex thinning/breakthrough right paracentral at T11, without neural impingement or measurable epidural mass. Anticipate postcontrast imaging. No visible effacement in the foramina. Cord:  Normal. Paraspinal and other soft tissues: No visible adenopathy or  lung nodule. Disc levels: No notable degenerative changes. MRI LUMBAR SPINE FINDINGS Segmentation:  Standard. Alignment:  Reversal of lordosis due to compression deformity. Vertebrae: Numerous rounded metastases throughout the bodies and posterior elements of the lumbar spine and visualized sacrum. Large lesion infiltrating the L4 body with compression fracture that is acute based on lumbar spine radiography June 2017 and perispinal edema. Height loss measures 60-70% centrally. There is posterior bulging with possible epidural tumor causing high-grade canal stenosis. Postcontrast imaging would better define the extent of extraosseous tumor extension. No gross foraminal impingement. No other compression fracture is noted. Conus medullaris: Extends to the T12/L1 disc level and appears normal. Paraspinal and other soft tissues: Paraspinal edema around the compression fracture Disc levels: L5-S1 left eccentric disc protrusion with left more than right S1 impingement in the subarticular recesses. IMPRESSION: 1. Innumerable osseous metastases with acute pathologic fracture the L4 vertebral body. 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 2. L5-S1 protrusion with left more than right S1 impingement. Electronically Signed   By: Monte Fantasia M.D.   On: 12/31/2015 20:32   Mr Lumbar Spine Wo Contrast  Result Date: 12/31/2015 CLINICAL DATA:  History of breast cancer with unremitting back pain EXAM: MRI THORACIC AND LUMBAR SPINE WITHOUT CONTRAST TECHNIQUE: Multiplanar and multiecho pulse sequences of the thoracic and lumbar spine were obtained without intravenous contrast. COMPARISON:  None. FINDINGS: MRI THORACIC SPINE FINDINGS Alignment:  Physiologic. Vertebrae: Sparing T1 and T2 there are numerous T1 and T2 hypo intense rounded lesions throughout the thoracic bodies and posterior elements. Posterior cortex thinning/breakthrough right paracentral at T11, without neural impingement or  measurable epidural mass. Anticipate postcontrast imaging. No visible effacement in the foramina. Cord:  Normal. Paraspinal and other soft tissues: No visible adenopathy or lung nodule. Disc levels: No notable degenerative changes. MRI LUMBAR SPINE FINDINGS Segmentation:  Standard. Alignment:  Reversal of lordosis due to compression deformity. Vertebrae: Numerous rounded metastases throughout the bodies and posterior elements of the lumbar spine and visualized sacrum. Large lesion infiltrating the L4 body with compression fracture that is acute based on lumbar spine radiography June 2017 and perispinal edema. Height loss measures 60-70% centrally. There is posterior bulging with possible epidural tumor causing high-grade canal stenosis. Postcontrast imaging would better define the extent of extraosseous tumor extension. No gross foraminal impingement. No other compression fracture is noted. Conus medullaris: Extends to the T12/L1 disc level and appears normal. Paraspinal and other soft tissues: Paraspinal edema around the compression fracture Disc levels: L5-S1 left eccentric disc protrusion with left more than right S1 impingement in the subarticular recesses. IMPRESSION: 1. Innumerable osseous metastases with acute pathologic fracture the L4 vertebral body. 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 2. L5-S1 protrusion with left more than right S1 impingement. Electronically Signed   By: Monte Fantasia M.D.   On: 12/31/2015 20:32   Ct Abdomen Pelvis W Contrast  Result Date: 01/01/2016 CLINICAL DATA:  Breast cancer with bony metastases and low back pain. EXAM: CT CHEST, ABDOMEN, AND PELVIS WITH CONTRAST TECHNIQUE: Multidetector CT imaging of the chest, abdomen and pelvis was performed following the standard protocol during bolus administration of intravenous contrast. CONTRAST:  124mL ISOVUE-300 IOPAMIDOL (ISOVUE-300) INJECTION 61% COMPARISON:  None. FINDINGS:  CT CHEST FINDINGS  Cardiovascular: The heart size is normal. No pericardial effusion. No thoracic aortic aneurysm. Mediastinum/Nodes: No mediastinal lymphadenopathy. There is no hilar lymphadenopathy. There is no axillary lymphadenopathy. The esophagus has normal imaging features. Lungs/Pleura: Post radiation change identified in the subpleural anterior right lung. 2 mm right upper lobe pulmonary nodule is seen on image 41 series for. No other suspicious pulmonary nodule or mass. No focal airspace consolidation. No evidence for pulmonary edema or pleural effusion. Musculoskeletal: 3.5 x 4.3 cm rim enhancing fluid collection medial right breast likely related to lumpectomy. Multiple lytic bone lesions are seen in the thoracic spine CT ABDOMEN PELVIS FINDINGS Hepatobiliary: No focal abnormality within the liver parenchyma. There is no evidence for gallstones, gallbladder wall thickening, or pericholecystic fluid. No intrahepatic or extrahepatic biliary dilation. Pancreas: No focal mass lesion. No dilatation of the main duct. No intraparenchymal cyst. No peripancreatic edema. Spleen: No splenomegaly. No focal mass lesion. Adrenals/Urinary Tract: No adrenal nodule or mass. Small cyst identified upper pole left kidney No evidence for hydroureter. The urinary bladder appears normal for the degree of distention. Stomach/Bowel: Stomach is nondistended. No gastric wall thickening. No evidence of outlet obstruction. Duodenum is normally positioned as is the ligament of Treitz. No small bowel wall thickening. No small bowel dilatation. The terminal ileum is normal. The appendix is normal. No gross colonic mass. No colonic wall thickening. No substantial diverticular change. Vascular/Lymphatic: No abdominal aortic aneurysm. No abdominal aortic atherosclerotic calcification. There is no gastrohepatic or hepatoduodenal ligament lymphadenopathy. No intraperitoneal or retroperitoneal lymphadenopathy. No pelvic sidewall lymphadenopathy. Reproductive:  The uterus has normal CT imaging appearance. There is no adnexal mass. Other: No intraperitoneal free fluid. Musculoskeletal: Innumerable sclerotic lesions are identified within the bony anatomy. L4 compression fracture better characterized on recent MRI. IMPRESSION: 1. Tiny right upper lobe pulmonary nodule. Continued attention on follow-up imaging recommended. 2. Post radiation changes anterior right lung with focal fluid collection medial right breast likely related to prior lumpectomy. 3. Widespread bony metastatic involvement. Electronically Signed   By: Misty Stanley M.D.   On: 01/01/2016 13:33   (Echo, Carotid, EGD, Colonoscopy, ERCP)    Subjective: She relates her pain is much better controlled she has no new complaints.   Discharge Exam: Vitals:   01/05/16 1346 01/06/16 0524  BP: 131/65 117/86  Pulse: 62 80  Resp: 16 14  Temp: 98.5 F (36.9 C) 98 F (36.7 C)   Vitals:   01/05/16 0500 01/05/16 0815 01/05/16 1346 01/06/16 0524  BP: 120/70  131/65 117/86  Pulse: (!) 56 (!) 54 62 80  Resp: 16 14 16 14   Temp: 97.5 F (36.4 C) 98.6 F (37 C) 98.5 F (36.9 C) 98 F (36.7 C)  TempSrc: Oral Oral Oral Oral  SpO2: 100%  98% 99%  Weight:      Height:        General: Pt is alert, awake, not in acute distress Cardiovascular: RRR, S1/S2 +, no rubs, no gallops Respiratory: CTA bilaterally, no wheezing, no rhonchi Abdominal: Soft, NT, ND, bowel sounds + Extremities: no edema, no cyanosis    The results of significant diagnostics from this hospitalization (including imaging, microbiology, ancillary and laboratory) are listed below for reference.     Microbiology: Recent Results (from the past 240 hour(s))  Urine culture     Status: Abnormal   Collection Time: 12/31/15 12:20 AM  Result Value Ref Range Status   Specimen Description URINE, CLEAN CATCH  Final   Special Requests NONE  Final   Culture (A)  Final    50,000 COLONIES/mL METHICILLIN RESISTANT STAPHYLOCOCCUS AUREUS    Report Status 01/03/2016 FINAL  Final   Organism ID, Bacteria METHICILLIN RESISTANT STAPHYLOCOCCUS AUREUS (A)  Final      Susceptibility   Methicillin resistant staphylococcus aureus - MIC*    CIPROFLOXACIN <=0.5 SENSITIVE Sensitive     GENTAMICIN <=0.5 SENSITIVE Sensitive     NITROFURANTOIN <=16 SENSITIVE Sensitive     OXACILLIN >=4 RESISTANT Resistant     TETRACYCLINE <=1 SENSITIVE Sensitive     VANCOMYCIN <=0.5 SENSITIVE Sensitive     TRIMETH/SULFA <=10 SENSITIVE Sensitive     CLINDAMYCIN <=0.25 SENSITIVE Sensitive     RIFAMPIN <=0.5 SENSITIVE Sensitive     Inducible Clindamycin NEGATIVE Sensitive     * 50,000 COLONIES/mL METHICILLIN RESISTANT STAPHYLOCOCCUS AUREUS     Labs: BNP (last 3 results) No results for input(s): BNP in the last 8760 hours. Basic Metabolic Panel:  Recent Labs Lab 12/31/15 2150 01/04/16 0504  NA 142 132*  K 4.4 4.2  CL 104 98*  CO2 30 27  GLUCOSE 99 264*  BUN 10 12  CREATININE 0.77 0.65  CALCIUM 10.2 9.1   Liver Function Tests:  Recent Labs Lab 12/31/15 2150  AST 25  ALT 24  ALKPHOS 107  BILITOT 0.5  PROT 8.1  ALBUMIN 3.4*   No results for input(s): LIPASE, AMYLASE in the last 168 hours. No results for input(s): AMMONIA in the last 168 hours. CBC:  Recent Labs Lab 12/31/15 2150 01/04/16 0504  WBC 8.2 24.5*  NEUTROABS 5.4  --   HGB 10.9* 9.6*  HCT 35.8* 30.3*  MCV 83.6 81.5  PLT 325 278   Cardiac Enzymes: No results for input(s): CKTOTAL, CKMB, CKMBINDEX, TROPONINI in the last 168 hours. BNP: Invalid input(s): POCBNP CBG:  Recent Labs Lab 01/05/16 1131 01/05/16 1628 01/05/16 2130 01/06/16 0801 01/06/16 1145  GLUCAP 397* 326* 397* 320* 267*   D-Dimer No results for input(s): DDIMER in the last 72 hours. Hgb A1c  Recent Labs  01/04/16 0504  HGBA1C 6.1*   Lipid Profile No results for input(s): CHOL, HDL, LDLCALC, TRIG, CHOLHDL, LDLDIRECT in the last 72 hours. Thyroid function studies No results for  input(s): TSH, T4TOTAL, T3FREE, THYROIDAB in the last 72 hours.  Invalid input(s): FREET3 Anemia work up No results for input(s): VITAMINB12, FOLATE, FERRITIN, TIBC, IRON, RETICCTPCT in the last 72 hours. Urinalysis    Component Value Date/Time   COLORURINE YELLOW 12/31/2015 0020   APPEARANCEUR TURBID (A) 12/31/2015 0020   LABSPEC 1.022 12/31/2015 0020   PHURINE 7.0 12/31/2015 0020   GLUCOSEU NEGATIVE 12/31/2015 0020   HGBUR NEGATIVE 12/31/2015 0020   BILIRUBINUR NEGATIVE 12/31/2015 0020   KETONESUR NEGATIVE 12/31/2015 0020   PROTEINUR NEGATIVE 12/31/2015 0020   NITRITE NEGATIVE 12/31/2015 0020   LEUKOCYTESUR SMALL (A) 12/31/2015 0020   Sepsis Labs Invalid input(s): PROCALCITONIN,  WBC,  LACTICIDVEN Microbiology Recent Results (from the past 240 hour(s))  Urine culture     Status: Abnormal   Collection Time: 12/31/15 12:20 AM  Result Value Ref Range Status   Specimen Description URINE, CLEAN CATCH  Final   Special Requests NONE  Final   Culture (A)  Final    50,000 COLONIES/mL METHICILLIN RESISTANT STAPHYLOCOCCUS AUREUS   Report Status 01/03/2016 FINAL  Final   Organism ID, Bacteria METHICILLIN RESISTANT STAPHYLOCOCCUS AUREUS (A)  Final      Susceptibility   Methicillin resistant staphylococcus aureus - MIC*    CIPROFLOXACIN <=  0.5 SENSITIVE Sensitive     GENTAMICIN <=0.5 SENSITIVE Sensitive     NITROFURANTOIN <=16 SENSITIVE Sensitive     OXACILLIN >=4 RESISTANT Resistant     TETRACYCLINE <=1 SENSITIVE Sensitive     VANCOMYCIN <=0.5 SENSITIVE Sensitive     TRIMETH/SULFA <=10 SENSITIVE Sensitive     CLINDAMYCIN <=0.25 SENSITIVE Sensitive     RIFAMPIN <=0.5 SENSITIVE Sensitive     Inducible Clindamycin NEGATIVE Sensitive     * 50,000 COLONIES/mL METHICILLIN RESISTANT STAPHYLOCOCCUS AUREUS     Time coordinating discharge: Over 30 minutes  SIGNED:   Charlynne Cousins, MD  [facility] 01/06/2016, 3:40 PM Pager   If 7PM-7AM, please contact  night-coverage [email or web address] Password [email or web address]

## 2016-01-05 NOTE — Progress Notes (Signed)
Occupational Therapy Treatment Patient Details Name: Debbie Martinez MRN: [id number] DOB: 13-Dec-1975 Today's Date: 01/05/2016    History of present illness 40 yo female admitted with severe back pain. MRI + mets to spine, pathologic L4 fx. Hx of breast cancer, HTN   OT comments  Plan is for discharge today.  Pt making good progress.  Plans to stay with her daughter's father. She will avoid steps into third floor apt and have more assistance. Needs reinforcement for back precautions:  Issued handout  Follow Up Recommendations  Supervision - Intermittent    Equipment Recommendations  Tub/shower bench;3 in 1 bedside comode    Recommendations for Other Services      Precautions / Restrictions Precautions Precautions: Fall Precaution Comments: logroll and back precautions for comfort Required Braces or Orthoses: Spinal Brace Spinal Brace: Applied in sitting position Restrictions Weight Bearing Restrictions: No       Mobility Bed Mobility     Rolling: Min guard Sidelying to sit: Min guard Supine to sit: Min guard   Sit to sidelying: Min guard General bed mobility comments: cues for back precautions  Transfers   Equipment used: Rolling walker (2 wheeled)   Sit to Stand: Supervision         General transfer comment: for safety    Balance                                   ADL       Grooming: Wash/dry Radiographer, therapeutic: Min guard;Ambulation;BSC;RW       Tub/ Shower Transfer: Tub transfer;Tub bench;Ambulation;Min guard (and leg lifter)     General ADL Comments: pt able to don spinal brace with set up. Practiced tub bench transfer.  She plans to stay with her daughter's father initially.  He also has a tub.  Educated that if he has shower doors, they need to be removed and curtain strung to use bench.  Issued back precaution sheet; pt needs cues for bed mobility      Vision                      Perception     Praxis      Cognition   Behavior During Therapy: [facility] for tasks assessed/performed Overall Cognitive Status: Within Functional Limits for tasks assessed                       Extremity/Trunk Assessment               Exercises     Shoulder Instructions       General Comments      Pertinent Vitals/ Pain       Pain Score: 2  Pain Location: low back Pain Descriptors / Indicators: Sore Pain Intervention(s): Limited activity within patient's tolerance;Monitored during session;Premedicated before session;Repositioned  Home Living                                          Prior Functioning/Environment              Frequency       Progress Toward Goals  OT Goals(current goals can now be found in the care  plan section)  Progress towards OT goals: Progressing toward goals     Plan      Co-evaluation                 End of Session     Activity Tolerance Patient tolerated treatment well   Patient Left in bed;with call bell/phone within reach;with bed alarm set   Nurse Communication          Time: [id number] OT Time Calculation (min): 26 min  Charges: OT General Charges $OT Visit: 1 Procedure OT Treatments $Self Care/Home Management : 23-37 mins  Debbie Martinez 01/05/2016, 2:10 PM  Lesle Chris, OTR/L [phone] 01/05/2016

## 2016-01-05 NOTE — Progress Notes (Signed)
Spoke with Dr Olevia Bowens concerning patients discharge, patents bed will not be delivered until 9/15 AM according to [facility].  Discharge canceled per Dr Olevia Bowens.

## 2016-01-05 NOTE — Progress Notes (Signed)
[facility] Therapy Treatment Record Phone [phone]   Radiation Therapy was administered to [name] on: 01/05/2016  9:31 AM and was treatment # 2 out of a planned course of 14 treatments.  Radiation Treatment  1). Beam photons with 6-10 energy  2). Brachytherapy None  3). Stereotactic Radiosurgery None  4). Other Radiation None     Donise Woodle L, RT (T)

## 2016-01-06 ENCOUNTER — Ambulatory Visit: Payer: Medicaid Other | Admitting: Hematology and Oncology

## 2016-01-06 ENCOUNTER — Encounter (HOSPITAL_COMMUNITY): Payer: Self-pay

## 2016-01-06 ENCOUNTER — Ambulatory Visit
Admit: 2016-01-06 | Discharge: 2016-01-06 | Disposition: A | Discharge status: Home / Self Care | Payer: Medicaid Other | Attending: Radiation Oncology | Admitting: Radiation Oncology

## 2016-01-06 LAB — GLUCOSE, CAPILLARY
GLUCOSE-CAPILLARY: 267 mg/dL — AB (ref 65–99)
Glucose-Capillary: 320 mg/dL — ABNORMAL HIGH (ref 65–99)

## 2016-01-06 MED ORDER — HEPARIN SOD (PORK) LOCK FLUSH 100 UNIT/ML IV SOLN
500.0000 [IU] | INTRAVENOUS | Status: AC | PRN
  Administered 2016-01-06: 500 [IU]
  Filled 2016-01-06: qty 5

## 2016-01-06 MED ORDER — METFORMIN HCL 500 MG PO TABS: 500.0000 mg | ORAL_TABLET | Freq: Two times a day (BID) | ORAL | 0 refills | Status: DC

## 2016-01-06 NOTE — Progress Notes (Signed)
Physical Therapy Treatment Patient Details Name: Debbie Martinez MRN: [id number] DOB: [birth year] Today's Date: 01/06/2016    History of Present Illness 40 yo female admitted with severe back pain. MRI + mets to spine, pathologic L4 fx. Hx of breast cancer, HTN    PT Comments    Pt walked in hallway and was assisted back to bed. Pt reported feeling much better when upright and walking, however she felt slightly more tired/weak today due to radiation and not sleeping well last night. Pt stated that she will now be discharging to a one level home with no stairs until she regains her strength and mobility.   Follow Up Recommendations  Home health PT;Supervision for mobility/OOB     Equipment Recommendations  Rolling walker with 5" wheels    Recommendations for Other Services       Precautions / Restrictions Precautions Precautions: Fall;Back Precaution Comments: logroll and back precautions for comfort Required Braces or Orthoses: Spinal Brace Spinal Brace: Applied in sitting position Restrictions Weight Bearing Restrictions: No    Mobility  Bed Mobility Overal bed mobility: Needs Assistance Bed Mobility: Rolling;Sidelying to Sit;Supine to Sit;Sit to Supine;Sit to Sidelying Rolling: Min guard Sidelying to sit: Min guard Supine to sit: Min guard Sit to supine: Min guard Sit to sidelying: Min guard General bed mobility comments: verbal cues to maintain back precautions; pt was able to recall precautions once cued to perform task with precautions in mind  Transfers Overall transfer level: Needs assistance Equipment used: Rolling walker (2 wheeled) Transfers: Sit to/from Stand Sit to Stand: Min guard         General transfer comment: guarding for safety; pt stated she felt slightly more weak/tired after radiation  Ambulation/Gait Ambulation/Gait assistance: Min guard Ambulation Distance (Feet): 300 Feet Assistive device: Rolling walker (2 wheeled) Gait  Pattern/deviations: Step-through pattern;Decreased stride length     General Gait Details: guarding for safety in case of muscle spasm or change in symptoms; pt moved at slower pace and tolerated increased gait distance well   Stairs            Wheelchair Mobility    Modified Rankin (Stroke Patients Only)       Balance                                    Cognition Arousal/Alertness: Awake/alert Behavior During Therapy: WFL for tasks assessed/performed Overall Cognitive Status: Within Functional Limits for tasks assessed                      Exercises      General Comments        Pertinent Vitals/Pain Pain Assessment: 0-10 Pain Score: 4  Pain Location: low back Pain Descriptors / Indicators: Discomfort;Sore Pain Intervention(s): Limited activity within patient's tolerance;Monitored during session;Repositioned (Pt requested pain medication from RN at end of visit)    Home Living                      Prior Function            PT Goals (current goals can now be found in the care plan section) Progress towards PT goals: Progressing toward goals    Frequency   PT Diagnosis  Min 3X/week   Metastatic breast cancer ([place]) - Plan: lidocaine-prilocaine (EMLA) cream, dexamethasone (DECADRON) tablet 4 mg, dexamethasone (DECADRON) 4 MG tablet  Pain - Plan:  MR Thoracic Spine Wo Contrast, MR Thoracic Spine Wo Contrast  Fracture of L4 vertebra, closed, initial encounter  Spinal stenosis, unspecified spinal region  Breast carcinoma metastatic to multiple sites [facility])  Other constipation - Plan: polyethylene glycol (MIRALAX / GLYCOLAX) packet 17 g, polyethylene glycol (MIRALAX / GLYCOLAX) packet, DISCONTINUED: senna-docusate (Senokot-S) tablet 1 tablet  Other abnormalities of gait and mobility     PT Plan Current plan remains appropriate    Co-evaluation             End of Session Equipment Utilized During Treatment: Back  brace (for lumbar spine) Activity Tolerance: Patient tolerated treatment well Patient left: in bed;with call bell/phone within reach;with family/visitor present     Time: [id number] PT Time Calculation (min) (ACUTE ONLY): 23 min  Charges:  $Gait Training: 8-22 mins                    G Codes:      Dewitt Hoes 2016/01/17, 1:30 PM  Dewitt Hoes, SPT

## 2016-01-06 NOTE — Progress Notes (Signed)
Discharge instructions reviewed with patient, patient verbalized understanding.

## 2016-01-09 ENCOUNTER — Ambulatory Visit
Admission: RE | Admit: 2016-01-09 | Discharge: 2016-01-09 | Disposition: A | Discharge status: Home / Self Care | Payer: Medicaid Other | Source: Ambulatory Visit | Attending: Radiation Oncology | Admitting: Radiation Oncology

## 2016-01-09 ENCOUNTER — Telehealth: Payer: Self-pay | Admitting: *Deleted

## 2016-01-09 ENCOUNTER — Encounter: Payer: Self-pay | Admitting: Radiation Oncology

## 2016-01-09 VITALS — BP 107/72 | HR 86 | Temp 98.9°F | Ht 67.0 in

## 2016-01-09 DIAGNOSIS — C50919 Malignant neoplasm of unspecified site of unspecified female breast: Secondary | ICD-10-CM | POA: Diagnosis present

## 2016-01-09 DIAGNOSIS — C7951 Secondary malignant neoplasm of bone: Secondary | ICD-10-CM

## 2016-01-09 DIAGNOSIS — C50911 Malignant neoplasm of unspecified site of right female breast: Secondary | ICD-10-CM

## 2016-01-09 DIAGNOSIS — C799 Secondary malignant neoplasm of unspecified site: Secondary | ICD-10-CM | POA: Diagnosis present

## 2016-01-09 DIAGNOSIS — C50211 Malignant neoplasm of upper-inner quadrant of right female breast: Secondary | ICD-10-CM | POA: Diagnosis present

## 2016-01-09 MED ORDER — DEXAMETHASONE 4 MG PO TABS: 4.0000 mg | ORAL_TABLET | Freq: Three times a day (TID) | ORAL | 0 refills | Status: DC

## 2016-01-09 MED ORDER — SONAFINE EX EMUL
1.0000 | Freq: Once | CUTANEOUS | Status: AC
Start: 2016-01-09 — End: 2016-01-09
  Administered 2016-01-09: 1 via TOPICAL

## 2016-01-09 MED ORDER — OMEPRAZOLE 20 MG PO CPDR: 20.0000 mg | DELAYED_RELEASE_CAPSULE | Freq: Every day | ORAL | 2 refills | Status: DC

## 2016-01-09 MED ORDER — FLUCONAZOLE 100 MG PO TABS: ORAL_TABLET | ORAL | 0 refills | Status: DC

## 2016-01-09 NOTE — Progress Notes (Signed)
Ms. Lacsamana is here for her 4th fraction of radiation to her Lumbar Spine. She denies pain at this time. She does take 1 percocet every 6 hours for pain. She tells me that occasionally she needs to take an extra dose to help with pain. She does report some urinary frequency. She has some muscle spasms in her Left Leg which have started occurring recently.  She is using a walker. She has had muscle spasms in her Right Leg, and feels unsteady even using her walker. She tells me she is eating well, but  experienced some heartburn yesterday. She is having some constipation, but had a bowel movement last on Saturday. She is using Miralax daily.   BP 107/72   Pulse 86   Temp 98.9 F (37.2 C)   Ht 5\' 7"  (1.702 m)   LMP 12/23/2015   SpO2 98% Comment: room air   Wt Readings from Last 3 Encounters:  01/01/16 173 lb 11.6 oz (78.8 kg)  07/07/15 180 lb 1.6 oz (81.7 kg)  05/27/15 188 lb 6.4 oz (85.5 kg)

## 2016-01-09 NOTE — Progress Notes (Signed)
Pt here for patient teaching.  Pt given Radiation and You booklet, skin care instructions and Sonafine. Pt reports they have refused to watch the Radiation Therapy Education video.  Reviewed areas of pertinence such as fatigue, skin changes and urinary and bladder changes . Pt able to give teach back of to pat skin, use unscented/gentle soap, have Imodium on hand and drink plenty of water,apply Sonafine bid and avoid applying anything to skin within 4 hours of treatment. Pt verbalizes understanding of information given and will contact nursing with any questions or concerns.     [email or web address]

## 2016-01-09 NOTE — Progress Notes (Signed)
   Weekly Management Note:  outpatient    ICD-9-CM ICD-10-CM   1. Bone metastasis (HCC) 198.5 C79.51 omeprazole (PRILOSEC) 20 MG capsule     fluconazole (DIFLUCAN) 100 MG tablet  2. Metastatic breast cancer (HCC) 174.9 C50.919 dexamethasone (DECADRON) 4 MG tablet   199.1 C79.9     Current Dose:  10 Gy  Projected Dose: 35 Gy   Narrative:  The patient presents for routine under treatment assessment.  CBCT/MVCT images/Port film x-rays were reviewed.  The chart was checked. Pain is better, possibly r/t pain meds.  Some heartburn.  Physical Findings:  Wt Readings from Last 3 Encounters:  01/01/16 173 lb 11.6 oz (78.8 kg)  07/07/15 180 lb 1.6 oz (81.7 kg)  05/27/15 188 lb 6.4 oz (85.5 kg)    height is 5\' 7"  (1.702 m). Her temperature is 98.9 F (37.2 C). Her blood pressure is 107/72 and her pulse is 86. Her oxygen saturation is 98%.  thrush in mouth. In [facility]  CBC    Component Value Date/Time   WBC 24.5 (H) 01/04/2016 0504   RBC 3.72 (L) 01/04/2016 0504   HGB 9.6 (L) 01/04/2016 0504   HGB 11.5 (L) 07/01/2014 0821   HCT 30.3 (L) 01/04/2016 0504   HCT 36.3 07/01/2014 0821   PLT 278 01/04/2016 0504   PLT 258 07/01/2014 0821   MCV 81.5 01/04/2016 0504   MCV 89.0 07/01/2014 0821   MCH 25.8 (L) 01/04/2016 0504   MCHC 31.7 01/04/2016 0504   RDW 14.5 01/04/2016 0504   RDW 14.2 07/01/2014 0821   LYMPHSABS 2.0 12/31/2015 2150   LYMPHSABS 1.3 07/01/2014 0821   MONOABS 0.7 12/31/2015 2150   MONOABS 0.3 07/01/2014 0821   EOSABS 0.1 12/31/2015 2150   EOSABS 0.1 07/01/2014 0821   BASOSABS 0.0 12/31/2015 2150   BASOSABS 0.0 07/01/2014 0821     CMP     Component Value Date/Time   NA 132 (L) 01/04/2016 0504   NA 142 07/01/2014 0821   K 4.2 01/04/2016 0504   K 3.9 07/01/2014 0821   CL 98 (L) 01/04/2016 0504   CO2 27 01/04/2016 0504   CO2 24 07/01/2014 0821   GLUCOSE 264 (H) 01/04/2016 0504   GLUCOSE 153 (H) 07/01/2014 0821   BUN 12 01/04/2016 0504   BUN 12.3 07/01/2014 0821   CREATININE 0.65 01/04/2016 0504   CREATININE 0.8 07/01/2014 0821   CALCIUM 9.1 01/04/2016 0504   CALCIUM 9.2 07/01/2014 0821   PROT 8.1 12/31/2015 2150   PROT 7.1 07/01/2014 0821   ALBUMIN 3.4 (L) 12/31/2015 2150   ALBUMIN 3.7 07/01/2014 0821   AST 25 12/31/2015 2150   AST 19 07/01/2014 0821   ALT 24 12/31/2015 2150   ALT 35 07/01/2014 0821   ALKPHOS 107 12/31/2015 2150   ALKPHOS 63 07/01/2014 0821   BILITOT 0.5 12/31/2015 2150   BILITOT 0.27 07/01/2014 0821   GFRNONAA >60 01/04/2016 0504   GFRAA >60 01/04/2016 0504     Impression:  The patient is tolerating radiotherapy.   Plan:  Continue radiotherapy as planned. Fluconazole for thrush. Omeprazole for heartburn while taking dexamethasone.  Dexamethasone taper to start at end of week. Instructions given.  -----------------------------------  [name], MD

## 2016-01-09 NOTE — Telephone Encounter (Addendum)
Call from [place] with [facility] requesting orders to extend [facility] RN visits: Discussed with Dr. Lindi Adie: OK to extend nursing visits.  Schedule pt for office visit and [place] this week. Message to schedulers.  Returned call to [place] with verbal order for home health nursing. Allen Kell reports pt has been newly diagnosed with diabetes. Is now on Metformin: Will Dr. Lindi Adie order glucometer for pt? Informed her that pt will be seen in office this week, will notify Linden if this is something Dr. Lindi Adie will be managing.

## 2016-01-10 ENCOUNTER — Other Ambulatory Visit: Payer: Self-pay | Admitting: *Deleted

## 2016-01-10 ENCOUNTER — Ambulatory Visit
Admission: RE | Admit: 2016-01-10 | Discharge: 2016-01-10 | Disposition: A | Discharge status: Home / Self Care | Payer: Medicaid Other | Source: Ambulatory Visit | Attending: Radiation Oncology | Admitting: Radiation Oncology

## 2016-01-10 DIAGNOSIS — C50211 Malignant neoplasm of upper-inner quadrant of right female breast: Secondary | ICD-10-CM | POA: Diagnosis not present

## 2016-01-11 ENCOUNTER — Ambulatory Visit
Admission: RE | Admit: 2016-01-11 | Discharge: 2016-01-11 | Disposition: A | Discharge status: Home / Self Care | Payer: Medicaid Other | Source: Ambulatory Visit | Attending: Radiation Oncology | Admitting: Radiation Oncology

## 2016-01-11 ENCOUNTER — Other Ambulatory Visit: Payer: Self-pay

## 2016-01-11 DIAGNOSIS — C50211 Malignant neoplasm of upper-inner quadrant of right female breast: Secondary | ICD-10-CM | POA: Diagnosis not present

## 2016-01-12 ENCOUNTER — Other Ambulatory Visit: Payer: Self-pay

## 2016-01-12 ENCOUNTER — Ambulatory Visit
Admission: RE | Admit: 2016-01-12 | Discharge: 2016-01-12 | Disposition: A | Discharge status: Home / Self Care | Payer: Medicaid Other | Source: Ambulatory Visit | Attending: Radiation Oncology | Admitting: Radiation Oncology

## 2016-01-12 ENCOUNTER — Telehealth: Payer: Self-pay

## 2016-01-12 DIAGNOSIS — C50211 Malignant neoplasm of upper-inner quadrant of right female breast: Secondary | ICD-10-CM

## 2016-01-12 DIAGNOSIS — E119 Type 2 diabetes mellitus without complications: Secondary | ICD-10-CM

## 2016-01-12 MED ORDER — BLOOD GLUCOSE MONITOR KIT: PACK | 0 refills | Status: AC

## 2016-01-12 MED ORDER — BLOOD GLUCOSE MONITOR KIT: PACK | 0 refills | Status: DC

## 2016-01-12 NOTE — Telephone Encounter (Signed)
Faxed fmla paperwork to mothers employer 01/12/16

## 2016-01-13 ENCOUNTER — Telehealth: Payer: Self-pay | Admitting: Pharmacist

## 2016-01-13 ENCOUNTER — Ambulatory Visit
Admission: RE | Admit: 2016-01-13 | Discharge: 2016-01-13 | Disposition: A | Discharge status: Home / Self Care | Payer: Medicaid Other | Source: Ambulatory Visit | Attending: Radiation Oncology | Admitting: Radiation Oncology

## 2016-01-13 ENCOUNTER — Encounter: Payer: Medicaid Other | Admitting: *Deleted

## 2016-01-13 ENCOUNTER — Encounter: Payer: Self-pay | Admitting: *Deleted

## 2016-01-13 ENCOUNTER — Ambulatory Visit: Payer: Medicaid Other

## 2016-01-13 ENCOUNTER — Other Ambulatory Visit (HOSPITAL_BASED_OUTPATIENT_CLINIC_OR_DEPARTMENT_OTHER): Payer: Medicaid Other

## 2016-01-13 ENCOUNTER — Ambulatory Visit (HOSPITAL_BASED_OUTPATIENT_CLINIC_OR_DEPARTMENT_OTHER): Payer: Medicaid Other | Admitting: Hematology and Oncology

## 2016-01-13 VITALS — BP 146/88 | HR 81 | Temp 99.0°F | Resp 18 | Wt 173.8 lb

## 2016-01-13 DIAGNOSIS — C50211 Malignant neoplasm of upper-inner quadrant of right female breast: Secondary | ICD-10-CM

## 2016-01-13 DIAGNOSIS — Z006 Encounter for examination for normal comparison and control in clinical research program: Secondary | ICD-10-CM

## 2016-01-13 DIAGNOSIS — C773 Secondary and unspecified malignant neoplasm of axilla and upper limb lymph nodes: Secondary | ICD-10-CM | POA: Diagnosis not present

## 2016-01-13 DIAGNOSIS — Z79810 Long term (current) use of selective estrogen receptor modulators (SERMs): Secondary | ICD-10-CM | POA: Diagnosis not present

## 2016-01-13 DIAGNOSIS — Z170 Estrogen receptor positive status [ER+]: Secondary | ICD-10-CM | POA: Diagnosis not present

## 2016-01-13 DIAGNOSIS — G9520 Unspecified cord compression: Secondary | ICD-10-CM

## 2016-01-13 DIAGNOSIS — C7951 Secondary malignant neoplasm of bone: Secondary | ICD-10-CM

## 2016-01-13 DIAGNOSIS — C50911 Malignant neoplasm of unspecified site of right female breast: Secondary | ICD-10-CM

## 2016-01-13 LAB — COMPREHENSIVE METABOLIC PANEL
ALT: 24 U/L (ref 0–55)
AST: 14 U/L (ref 5–34)
Albumin: 3 g/dL — ABNORMAL LOW (ref 3.5–5.0)
Alkaline Phosphatase: 174 U/L — ABNORMAL HIGH (ref 40–150)
Anion Gap: 11 mEq/L (ref 3–11)
BUN: 10.9 mg/dL (ref 7.0–26.0)
CALCIUM: 9.7 mg/dL (ref 8.4–10.4)
CHLORIDE: 101 meq/L (ref 98–109)
CO2: 24 meq/L (ref 22–29)
CREATININE: 0.7 mg/dL (ref 0.6–1.1)
EGFR: >90 mL/min/{1.73_m2} (ref >90)
GLUCOSE: 224 mg/dL — AB (ref 70–140)
POTASSIUM: 4 meq/L (ref 3.5–5.1)
SODIUM: 136 meq/L (ref 136–145)
Total Bilirubin: 0.43 mg/dL (ref 0.20–1.20)
Total Protein: 7.7 g/dL (ref 6.4–8.3)

## 2016-01-13 LAB — CBC WITH DIFFERENTIAL/PLATELET
BASO%: 0.1 % (ref 0.0–2.0)
BASOS ABS: 0 10*3/uL (ref 0.0–0.1)
EOS%: 0.1 % (ref 0.0–7.0)
Eosinophils Absolute: 0 10*3/uL (ref 0.0–0.5)
HEMATOCRIT: 31.5 % — AB (ref 34.8–46.6)
HGB: 10.1 g/dL — ABNORMAL LOW (ref 11.6–15.9)
LYMPH#: 0.3 10*3/uL — AB (ref 0.9–3.3)
LYMPH%: 4 % — ABNORMAL LOW (ref 14.0–49.7)
MCH: 25.8 pg (ref 25.1–34.0)
MCHC: 32.1 g/dL (ref 31.5–36.0)
MCV: 80.6 fL (ref 79.5–101.0)
MONO#: 0.3 10*3/uL (ref 0.1–0.9)
MONO%: 3.2 % (ref 0.0–14.0)
NEUT#: 7.6 10*3/uL — ABNORMAL HIGH (ref 1.5–6.5)
NEUT%: 92.6 % — AB (ref 38.4–76.8)
Platelets: 241 10*3/uL (ref 145–400)
RBC: 3.91 10*6/uL (ref 3.70–5.45)
RDW: 15.3 % — ABNORMAL HIGH (ref 11.2–14.5)
WBC: 8.2 10*3/uL (ref 3.9–10.3)

## 2016-01-13 MED ORDER — RIBOCICLIB SUCCINATE 200 MG PO TABS: 600.0000 mg | ORAL_TABLET | Freq: Every day | ORAL | 6 refills | Status: DC

## 2016-01-13 MED ORDER — LETROZOLE 2.5 MG PO TABS: 2.5000 mg | ORAL_TABLET | Freq: Every day | ORAL | 3 refills | Status: DC

## 2016-01-13 MED FILL — KISQALI 200 MG DAILY DOSE: 200 | 28 days supply | Qty: 63 | Fill #0

## 2016-01-13 MED FILL — LETROZOLE 2.5 MG TABLET: 2.5 | 30 days supply | Qty: 30 | Fill #0

## 2016-01-13 NOTE — Telephone Encounter (Signed)
Oral Chemotherapy Pharmacist Encounter  I spoke with patient for overview of new oral chemotherapy medication: Kisqali + Femara (to start 01/27/16). The prescriptions have been sent to the [facility] and copay is $3.  Projected start date of Kisqali + Femara: 01/27/16  LMP: 01/12/16.  Pt is not sexually active.  To begin Zoladex 01/27/16.  Informed pt she should use back up contraception x 1 month after starting Kisqali.  Counseled patient on administration, dosing, side effects, safe handling, and monitoring. Side effects include but not limited to: cardiac arrhythmias, febrile neutropenia, fatigue, N/V/D, constipation, HA.  She voiced understanding and appreciation.  Potential Drug-Drug Interaction: Diflucan - Rx'd by Dr. Isidore Moos; to complete ~ 01/29/16.  Diflucan has indeterminate risk for QTc prolongation.  Thelma Comp has high risk of QTc prolongation.  Since pt to begin Kisqali 2 days before the Diflucan completes, please advise pt, or should this be a concern?? (CC'd Dr. Isidore Moos & Dr. Lindi Adie).  Pt seeing Dr. Isidore Moos on Mon (9/25) and plans to discuss. 10/09/15 EKG showed tachycardia; QTc interval = 443 msec. 01/13/16 CMET wnl re: electrolytes, renal and hepatic function.  Potential Drug-Food Interaction: Pt enjoys both grapefruit and pomegranate juices.  She will avoid these while on Kisqali.  [facility] OP Rx had to order Kisqali but will call pt when the Rx is ready for pick up and pt is aware.  Will follow up with patient regarding insurance and pharmacy. Will follow up in 1-2 weeks for adherence and toxicity management.   Thank you, Kennith Center, Pharm.D., CPP 01/13/2016@2 :25 PM Oral Chemotherapy Clinic

## 2016-01-13 NOTE — Assessment & Plan Note (Signed)
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   Treatment summary:  1. Patient completed neoadjuvant dose dense Adriamycin and Cytoxan and weekly Taxol X 12 started 02/09/2014 and completed 07/01/14 2. Right double lumpectomy 2:00: IDC grade 3, 3.2 cm, high-grade DCIS, LV I present, PNI present,; 12:00: IDC grade 3; 0.8 cm, high-grade DCIS, LVI, 4/4 lymph nodes positive with ECE, ER 90%, PR 40%, HER-2 negative margin positive T2 N2 M0 stage IIIa 3. Patient is on Alliance clinical trial and she was randomized intraoperatively for NO lymph node dissection. 4. Tamoxifen 20 mg daily started 12/29/2014 stopped July 2016 for adverse effects  Relapsed/metastatic disease diagnosed 12/31/2015 when she presented with cord compression with innumerable bone metastases with L4 pathological fracture  Current treatment: Palliative radiation therapy to the spine  Treatment plan: Ibrance with letrozole plus ovarian suppression Ibrance: I discussed the risks and benefits of Ibrance including myelosuppression especially neutropenia and with that risk of infection, there is risk of pulmonary embolism and mild peripheral neuropathy as well. Fatigue, nausea, diarrhea, decreased appetite as well as alopecia and thrombocytopenia are also potential side effects of Ibrance

## 2016-01-13 NOTE — Progress Notes (Addendum)
Patient Care Team: Provider Not In System as PCP - Little Flock III, MD as Consulting Physician (General Surgery) [name], MD as Consulting Physician (Hematology and Oncology) [name], MD as Attending Physician (Radiation Oncology) Sylvan Cheese, NP as Nurse Practitioner (Hematology and Oncology)  DIAGNOSIS: Breast cancer of upper-inner quadrant of right female breast [facility])   Staging form: Breast, AJCC 7th Edition   - Clinical: Stage IIB (T2, N1, cM0) - Unsigned         Staging comments: Staged at breast conference 01/20/14.    - Pathologic stage from 08/25/2014: Stage IIIA (yT2(m), N2a, cM0) - Signed by Enid Cutter, MD on 09/02/2014         Staging comments: Staged on final lumpectomy specimen by Dr. Lyndon Code  SUMMARY OF ONCOLOGIC HISTORY:   Breast cancer of upper-inner quadrant of right female breast ([place])   01/12/2014 Mammogram    Right breast - two lesions: #1 2:00 position 1.6 x 1.9 cm and #2 12:00 position 0.5 x 0.9 cm. Distance between the 2 was 4.5 cm, right axillary lymph node enlargement      01/12/2014 Initial Biopsy    Right breast 3 biopsies: All were IDC with DCIS ER+ PR + Ki-67 85% HER-2 negative: Right axillary lymph node also positive for metastatic cancer ER positive HER-2 negative Ki-67 80% grade 2      01/18/2014 Breast MRI    Right breast 12:00 position 2.8 x 2 x 2.2 cm: 2:00 position 1.3 x 0.8 x 0.5 cm, right axilla multiple enlarged lymph nodes 1 cm in size, right retropectoral lymph node 0.6 cm      01/18/2014 Clinical Stage    Stage IIB T2 N1      01/25/2014 Procedure    Breast/Ovarian ([facility]) reveals no clinically significant variant at ATM, BARD1, BRCA1, BRCA2, BRIP1, CDH1, CHEK2, EPCAM, FANCC, MLH1, MSH2, MSH6, NBN, PALB2, PMS2, PTEN, RAD51C, RAD51D, TP53, and XRCC2.       02/11/2014 -  Neo-Adjuvant Chemotherapy    Doxorubicin and cyclophosphamide X 4 followed by Taxol weekly x12       07/12/2014 Breast MRI    Partial response to  neoadjuvant chemotherapy right breast mass decreased from 2.8 cm to 2.3 cm, 2 small masses along the medial margin also decreased; significant decrease right breast mass 11 to 12:00 and 13 mm to 8 mm, decrease in right axilla LN      08/23/2014 Definitive Surgery    Right lumpectomy/[name]) 2:00: IDC grade 3, 3.2 cm, high-grade DCIS, LV I present, PNI present,; 12:00: IDC grade 30.8 cm, high-grade DCIS, LV I, 4/4 lymph nodes positive with ECE, ER 90%, PR 40%, HER-2 negative margin positive      08/23/2014 Pathologic Stage    Stage IIIA: ypT2 ypN2a      09/15/2014 Surgery    Reexcision of margins: clear; no evidence of malignancy      11/03/2014 - 12/14/2014 Radiation Therapy    Adjuvant XRT Isidore Moos): Right Breast, supraclavicular nodes, axillary nodes, and internal mammary nodes: 50 Gy over 25 fractions; right breast boost: 10 Gy over 5 fractions. Total dose: 60 Gy      12/29/2014 -  Anti-estrogen oral therapy    Tamoxifen 20 mg daily      04/08/2015 Survivorship    Survivorship care plan completed and mailed to patient in lieu of in person visit at her request      12/31/2015 Relapse/Recurrence    Innumerable osseous metastases with acute pathologic fracture  the L4 vertebral body. 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 L5-S1 protrusion with left more than right S1 impingement      01/05/2016 - 01/12/2016 Radiation Therapy    Palliative radiation to the spine for cord compression       CHIEF COMPLIANT: Metastatic breast cancer with cord compression  INTERVAL HISTORY: Debbie Martinez is a 40 yr old with above history of newly diagnosed metastatic breast cancer with cord compression. She is currently undergoing palliative XRT to the back. She complains of pain in the back constantly. She is using a walker to get around. Shes unable to live in her home which has steps. Living with her ex husband.  REVIEW OF SYSTEMS:   Constitutional: Denies fevers,  chills or abnormal weight loss Eyes: Denies blurriness of vision Ears, nose, mouth, throat, and face: Denies mucositis or sore throat Respiratory: Denies cough, dyspnea or wheezes Cardiovascular: Denies palpitation, chest discomfort Gastrointestinal:  Denies nausea, heartburn or change in bowel habits Skin: Denies abnormal skin rashes Lymphatics: Denies new lymphadenopathy or easy bruising Neurological:Lower ext weaknesses and back pain Behavioral/Psych: Mood is stable, no new changes  Extremities: No lower extremity edema All other systems were reviewed with the patient and are negative.  I have reviewed the past medical history, past surgical history, social history and family history with the patient and they are unchanged from previous note.  ALLERGIES:  is allergic to lisinopril and tamoxifen.  MEDICATIONS:  Current Outpatient Prescriptions  Medication Sig Dispense Refill  . atenolol (TENORMIN) 50 MG tablet Take 50 mg by mouth daily.    . blood glucose meter kit and supplies KIT Dispense based on patient and insurance preference. Use up to four times daily as directed. (FOR ICD-9 250.00, 250.01). 1 each 0  . Cholecalciferol (VITAMIN D-3 PO) Take 1 capsule by mouth daily.    Marland Kitchen dexamethasone (DECADRON) 4 MG tablet Take 1 tablet (4 mg total) by mouth 3 (three) times daily. Taper to 1 tablet 2 times daily on Sat, 01-14-16. 90 tablet 0  . diazepam (VALIUM) 5 MG tablet Take 1 tablet (5 mg total) by mouth 2 (two) times daily. (Patient taking differently: Take 5 mg by mouth 2 (two) times daily as needed for anxiety. ) 10 tablet 0  . ENSURE (ENSURE) Take 237 mLs by mouth 2 (two) times daily between meals.     . fluconazole (DIFLUCAN) 100 MG tablet Take 2 tablets today, then 1 tablet daily x 20 more days. 22 tablet 0  . Iron-Vitamins (GERITOL) LIQD Take 5 mLs by mouth daily.    . Magnesium Salicylate (DOANS PILLS PO) Take 2 tablets by mouth daily as needed (for pain).    . metFORMIN  (GLUCOPHAGE) 500 MG tablet Take 1 tablet (500 mg total) by mouth 2 (two) times daily with a meal. 60 tablet 0  . methocarbamol (ROBAXIN) 500 MG tablet Take 1 tablet (500 mg total) by mouth 2 (two) times daily as needed for muscle spasms. (Patient taking differently: Take 1,000 mg by mouth 4 (four) times daily as needed for muscle spasms. ) 20 tablet 0  . omeprazole (PRILOSEC) 20 MG capsule Take 1 capsule (20 mg total) by mouth daily. Take while using dexamethasone to prevent heartburn. 30 capsule 2  . oxyCODONE-acetaminophen (PERCOCET/ROXICET) 5-325 MG tablet Take 1 tablet by mouth every 6 (six) hours as needed for severe pain. 21 tablet 0  . polyethylene glycol (MIRALAX / GLYCOLAX) packet Take 17 g by mouth daily. 14 each  0  . traMADol (ULTRAM) 50 MG tablet Take 50-100 mg by mouth every 6 (six) hours as needed (for pain).     No current facility-administered medications for this visit.     PHYSICAL EXAMINATION: ECOG PERFORMANCE STATUS: 2  Vitals:   01/13/16 0929  BP: (!) 146/88  Pulse: 81  Resp: 18  Temp: 99 F (37.2 C)   Filed Weights   01/13/16 0929  Weight: 173 lb 12.8 oz (78.8 kg)    GENERAL:alert, no distress and comfortable SKIN: skin color, texture, turgor are normal, no rashes or significant lesions EYES: normal, Conjunctiva are pink and non-injected, sclera clear OROPHARYNX:no exudate, no erythema and lips, buccal mucosa, and tongue normal  NECK: supple, thyroid normal size, non-tender, without nodularity LYMPH:  no palpable lymphadenopathy in the cervical, axillary or inguinal LUNGS: clear to auscultation and percussion with normal breathing effort HEART: regular rate & rhythm and no murmurs and no lower extremity edema ABDOMEN:abdomen soft, non-tender and normal bowel sounds MUSCULOSKELETAL:no cyanosis of digits and no clubbing  NEURO: alert & oriented x 3 with fluent speech, Lower extremity weakness EXTREMITIES: No lower extremity edema   LABORATORY DATA:  I have  reviewed the data as listed   Chemistry      Component Value Date/Time   NA 136 01/13/2016 0911   K 4.0 01/13/2016 0911   CL 98 (L) 01/04/2016 0504   CO2 24 01/13/2016 0911   BUN 10.9 01/13/2016 0911   CREATININE 0.7 01/13/2016 0911      Component Value Date/Time   CALCIUM 9.7 01/13/2016 0911   ALKPHOS 174 (H) 01/13/2016 0911   AST 14 01/13/2016 0911   ALT 24 01/13/2016 0911   BILITOT 0.43 01/13/2016 0911       Lab Results  Component Value Date   WBC 8.2 01/13/2016   HGB 10.1 (L) 01/13/2016   HCT 31.5 (L) 01/13/2016   MCV 80.6 01/13/2016   PLT 241 01/13/2016   NEUTROABS 7.6 (H) 01/13/2016     ASSESSMENT & PLAN:  Breast cancer of upper-inner quadrant of right female breast ([place]) 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   Treatment summary:  1. Patient completed neoadjuvant dose dense Adriamycin and Cytoxan and weekly Taxol X 12 started 02/09/2014 and completed 07/01/14 2. Right double lumpectomy 2:00: IDC grade 3, 3.2 cm, high-grade DCIS, LV I present, PNI present,; 12:00: IDC grade 3; 0.8 cm, high-grade DCIS, LVI, 4/4 lymph nodes positive with ECE, ER 90%, PR 40%, HER-2 negative margin positive T2 N2 M0 stage IIIa 3. Patient is on [facility] clinical trial and she was randomized intraoperatively for NO lymph node dissection. 4. Tamoxifen 20 mg daily started 12/29/2014 stopped July 2016 for adverse effects  Relapsed/metastatic disease diagnosed 12/31/2015 when she presented with cord compression with innumerable bone metastases with L4 pathological fracture  Bone Mets: Xgeva   Current treatment: Palliative radiation therapy to the spine  Treatment plan: Ribociclib with letrozole plus ovarian suppression Ribociclib: I discussed the risks and benefits of Ribociclib including myelosuppression especially neutropenia and with that risk of  infection, there is risk of pulmonary embolism and mild peripheral neuropathy as well. Fatigue, nausea, diarrhea, decreased appetite as well as alopecia and thrombocytopenia are also potential side effects of Ribociclib.  Goals of care were discussed: Palliation  Start after she completes XRT.   No orders of the defined types were placed in this encounter.  The patient has a good understanding of the overall plan. she agrees with it. she will call with any problems that may develop before the next visit here.   Rulon Eisenmenger, MD 01/13/16  ADDENDUM:  No evidence of postoperative hemorrhage, wound infection, seroma, lymphedema or radiation dermatitis (this refers to her 2016 surgery and radiation therapy)

## 2016-01-13 NOTE — Progress Notes (Signed)
01/13/2016 Patient in to clinic today for first medical oncology follow-up visit since diagnosis of metastatic breast cancer. Spoke with patient briefly to let her know that she will continue to be monitored as part of the clinical trial, following today's visit. Thanked patient for her participation. Cindy S. Brigitte Pulse BSN, RN, CCRP 01/13/2016 10:10 AM

## 2016-01-14 ENCOUNTER — Encounter: Payer: Self-pay | Admitting: Hematology and Oncology

## 2016-01-14 DIAGNOSIS — C7951 Secondary malignant neoplasm of bone: Secondary | ICD-10-CM | POA: Insufficient documentation

## 2016-01-14 DIAGNOSIS — G9520 Unspecified cord compression: Secondary | ICD-10-CM | POA: Insufficient documentation

## 2016-01-16 ENCOUNTER — Ambulatory Visit
Admission: RE | Admit: 2016-01-16 | Discharge: 2016-01-16 | Disposition: A | Discharge status: Home / Self Care | Payer: Medicaid Other | Source: Ambulatory Visit | Attending: Radiation Oncology | Admitting: Radiation Oncology

## 2016-01-16 ENCOUNTER — Encounter: Payer: Self-pay | Admitting: Radiation Oncology

## 2016-01-16 ENCOUNTER — Other Ambulatory Visit: Payer: Self-pay | Admitting: Hematology and Oncology

## 2016-01-16 VITALS — BP 130/92 | HR 93 | Temp 98.8°F | Ht 67.0 in | Wt 173.0 lb

## 2016-01-16 DIAGNOSIS — C50211 Malignant neoplasm of upper-inner quadrant of right female breast: Secondary | ICD-10-CM | POA: Diagnosis not present

## 2016-01-16 NOTE — Progress Notes (Signed)
Debbie Martinez presents for hr 10th fraction of radiation to her Lumbar spine. She reports pain in her back a 5/10. She has a prescription received from her [facility] admission for 1 percocet every 6 hours, but tells me she has had to take it more frequently at times. She only has two pills left at this time. She has been using muscle relaxers and tylenol to extend the percocet pills so she won't run out of them. She does report some fatigue, and will nap in the afternoon. She is using sonafine cream twice daily. She is currently taking decadron 4 mg twice daily. She does report some nausea at times, when she gets home from radiation and sometimes in the morning time. She reports she is eating well and having daily bowel movements.  BP (!) 130/92   Pulse 93   Temp 98.8 F (37.1 C)   Ht 5\' 7"  (1.702 m)   Wt 173 lb (78.5 kg)   LMP 12/23/2015   SpO2 99% Comment: room air  BMI 27.10 kg/m    Wt Readings from Last 3 Encounters:  01/16/16 173 lb (78.5 kg)  01/13/16 173 lb 12.8 oz (78.8 kg)  01/01/16 173 lb 11.6 oz (78.8 kg)

## 2016-01-16 NOTE — Progress Notes (Signed)
   Weekly Management Note:  outpatient    ICD-9-CM ICD-10-CM   1. Breast cancer of upper-inner quadrant of right female breast (HCC) 174.2 C50.211     Current Dose:  25 Gy  Projected Dose: 35 Gy   Narrative:  The patient presents for routine under treatment assessment.  CBCT/MVCT images/Port film x-rays were reviewed.  The chart was checked. Pain is better, significantly, with increased ability to lift feet when walking  Physical Findings:  Wt Readings from Last 3 Encounters:  01/16/16 173 lb (78.5 kg)  01/13/16 173 lb 12.8 oz (78.8 kg)  01/01/16 173 lb 11.6 oz (78.8 kg)    height is 5\' 7"  (1.702 m) and weight is 173 lb (78.5 kg). Her temperature is 98.8 F (37.1 C). Her blood pressure is 130/92 (abnormal) and her pulse is 93. Her oxygen saturation is 99%.  No thrush in mouth, no skin irritation over back.  Using [employer].  CBC    Component Value Date/Time   WBC 8.2 01/13/2016 0911   WBC 24.5 (H) 01/04/2016 0504   RBC 3.91 01/13/2016 0911   RBC 3.72 (L) 01/04/2016 0504   HGB 10.1 (L) 01/13/2016 0911   HCT 31.5 (L) 01/13/2016 0911   PLT 241 01/13/2016 0911   MCV 80.6 01/13/2016 0911   MCH 25.8 01/13/2016 0911   MCH 25.8 (L) 01/04/2016 0504   MCHC 32.1 01/13/2016 0911   MCHC 31.7 01/04/2016 0504   RDW 15.3 (H) 01/13/2016 0911   LYMPHSABS 0.3 (L) 01/13/2016 0911   MONOABS 0.3 01/13/2016 0911   EOSABS 0.0 01/13/2016 0911   BASOSABS 0.0 01/13/2016 0911     CMP     Component Value Date/Time   NA 136 01/13/2016 0911   K 4.0 01/13/2016 0911   CL 98 (L) 01/04/2016 0504   CO2 24 01/13/2016 0911   GLUCOSE 224 (H) 01/13/2016 0911   BUN 10.9 01/13/2016 0911   CREATININE 0.7 01/13/2016 0911   CALCIUM 9.7 01/13/2016 0911   PROT 7.7 01/13/2016 0911   ALBUMIN 3.0 (L) 01/13/2016 0911   AST 14 01/13/2016 0911   ALT 24 01/13/2016 0911   ALKPHOS 174 (H) 01/13/2016 0911   BILITOT 0.43 01/13/2016 0911   GFRNONAA >60 01/04/2016 0504   GFRAA >60 01/04/2016 0504     Impression:   The patient is tolerating radiotherapy.   Plan:  Continue radiotherapy as planned. Fluconazole for thrush.   Dexamethasone taper Instructions given to taper until Nov 16. -----------------------------------  [name], MD

## 2016-01-17 ENCOUNTER — Ambulatory Visit: Payer: Medicaid Other

## 2016-01-17 ENCOUNTER — Ambulatory Visit
Admission: RE | Admit: 2016-01-17 | Discharge: 2016-01-17 | Disposition: A | Discharge status: Home / Self Care | Payer: Medicaid Other | Source: Ambulatory Visit | Attending: Radiation Oncology | Admitting: Radiation Oncology

## 2016-01-17 DIAGNOSIS — C50211 Malignant neoplasm of upper-inner quadrant of right female breast: Secondary | ICD-10-CM | POA: Diagnosis not present

## 2016-01-18 ENCOUNTER — Ambulatory Visit
Admission: RE | Admit: 2016-01-18 | Discharge: 2016-01-18 | Disposition: A | Discharge status: Home / Self Care | Payer: Medicaid Other | Source: Ambulatory Visit | Attending: Radiation Oncology | Admitting: Radiation Oncology

## 2016-01-18 DIAGNOSIS — C50211 Malignant neoplasm of upper-inner quadrant of right female breast: Secondary | ICD-10-CM | POA: Diagnosis not present

## 2016-01-19 ENCOUNTER — Ambulatory Visit
Admission: RE | Admit: 2016-01-19 | Discharge: 2016-01-19 | Disposition: A | Discharge status: Home / Self Care | Payer: Medicaid Other | Source: Ambulatory Visit | Attending: Radiation Oncology | Admitting: Radiation Oncology

## 2016-01-19 DIAGNOSIS — C50211 Malignant neoplasm of upper-inner quadrant of right female breast: Secondary | ICD-10-CM | POA: Diagnosis not present

## 2016-01-20 ENCOUNTER — Ambulatory Visit
Admission: RE | Admit: 2016-01-20 | Discharge: 2016-01-20 | Disposition: A | Discharge status: Home / Self Care | Payer: Medicaid Other | Source: Ambulatory Visit | Attending: Radiation Oncology | Admitting: Radiation Oncology

## 2016-01-20 ENCOUNTER — Encounter: Payer: Self-pay | Admitting: Radiation Oncology

## 2016-01-20 VITALS — BP 124/75 | HR 105 | Temp 98.6°F | Ht 67.0 in | Wt 172.8 lb

## 2016-01-20 DIAGNOSIS — C7951 Secondary malignant neoplasm of bone: Secondary | ICD-10-CM

## 2016-01-20 DIAGNOSIS — C50211 Malignant neoplasm of upper-inner quadrant of right female breast: Secondary | ICD-10-CM | POA: Diagnosis not present

## 2016-01-20 MED ORDER — OXYCODONE-ACETAMINOPHEN 5-325 MG PO TABS: 1.0000 | ORAL_TABLET | Freq: Four times a day (QID) | ORAL | 0 refills | Status: DC | PRN

## 2016-01-20 NOTE — Progress Notes (Signed)
Patient had to leave to pick up her daughter before I could see her. I printed a Rx for percocet for her but voided this because I couldn't give it her.    -----------------------------------  [name], MD

## 2016-01-20 NOTE — Progress Notes (Signed)
Debbie Martinez is here after her radiation treatment today. She reports pain in her back and lower back a 5/10. She was taking percocet 1 every 6 hours for pain relief. She has been out of this medicine for 4 days and has been taking Tylenol for pain relief without much relief. She is asking for a refill of her Percocet today. She reports fatigue, and tells me "her body feels weak". She is using a walker for assistance with ambulating. The skin to her radiation site is intact and she is using sonafine cream twice daily. She will change to a Vitamin E cream when her tube is completed.   BP 124/75   Pulse (!) 105   Temp 98.6 F (37 C)   Ht 5\' 7"  (1.702 m)   Wt 172 lb 12.8 oz (78.4 kg)   LMP 12/23/2015   SpO2 100% Comment: room air  BMI 27.06 kg/m    Wt Readings from Last 3 Encounters:  01/20/16 172 lb 12.8 oz (78.4 kg)  01/16/16 173 lb (78.5 kg)  01/13/16 173 lb 12.8 oz (78.8 kg)

## 2016-01-23 ENCOUNTER — Encounter: Payer: Self-pay | Admitting: Radiation Oncology

## 2016-01-23 ENCOUNTER — Ambulatory Visit
Admission: RE | Admit: 2016-01-23 | Discharge: 2016-01-23 | Disposition: A | Discharge status: Home / Self Care | Payer: Medicaid Other | Source: Ambulatory Visit | Attending: Radiation Oncology | Admitting: Radiation Oncology

## 2016-01-23 ENCOUNTER — Ambulatory Visit: Payer: Medicaid Other

## 2016-01-23 DIAGNOSIS — C50211 Malignant neoplasm of upper-inner quadrant of right female breast: Secondary | ICD-10-CM | POA: Diagnosis not present

## 2016-01-23 DIAGNOSIS — C7951 Secondary malignant neoplasm of bone: Secondary | ICD-10-CM

## 2016-01-23 MED ORDER — OXYCODONE-ACETAMINOPHEN 5-325 MG PO TABS: 1.0000 | ORAL_TABLET | Freq: Four times a day (QID) | ORAL | 0 refills | Status: DC | PRN

## 2016-01-23 NOTE — Progress Notes (Signed)
   Weekly Management Note:  outpatient    ICD-9-CM ICD-10-CM   1. Bone metastasis (HCC) 198.5 C79.51 oxyCODONE-acetaminophen (PERCOCET/ROXICET) 5-325 MG tablet    Current Dose:  35 Gy  Projected Dose: 35 Gy   Narrative:  The patient presents for routine under treatment assessment.  CBCT/MVCT images/Port film x-rays were reviewed.  The chart was checked. Needs percocet refill.  Pain 5/10 on Tylenol. Tired. Mild queasiness at times. Denies skin irritation. Physical Findings:  Wt Readings from Last 3 Encounters:  01/23/16 173 lb (78.5 kg)  01/20/16 172 lb 12.8 oz (78.4 kg)  01/16/16 173 lb (78.5 kg)    height is 5\' 7"  (1.702 m) and weight is 173 lb (78.5 kg). Her temperature is 97.9 F (36.6 C). Her blood pressure is 127/87 and her pulse is 117 (abnormal). Her oxygen saturation is 100%.  NAD, Using Walker.  CBC    Component Value Date/Time   WBC 8.2 01/13/2016 0911   WBC 24.5 (H) 01/04/2016 0504   RBC 3.91 01/13/2016 0911   RBC 3.72 (L) 01/04/2016 0504   HGB 10.1 (L) 01/13/2016 0911   HCT 31.5 (L) 01/13/2016 0911   PLT 241 01/13/2016 0911   MCV 80.6 01/13/2016 0911   MCH 25.8 01/13/2016 0911   MCH 25.8 (L) 01/04/2016 0504   MCHC 32.1 01/13/2016 0911   MCHC 31.7 01/04/2016 0504   RDW 15.3 (H) 01/13/2016 0911   LYMPHSABS 0.3 (L) 01/13/2016 0911   MONOABS 0.3 01/13/2016 0911   EOSABS 0.0 01/13/2016 0911   BASOSABS 0.0 01/13/2016 0911     CMP     Component Value Date/Time   NA 136 01/13/2016 0911   K 4.0 01/13/2016 0911   CL 98 (L) 01/04/2016 0504   CO2 24 01/13/2016 0911   GLUCOSE 224 (H) 01/13/2016 0911   BUN 10.9 01/13/2016 0911   CREATININE 0.7 01/13/2016 0911   CALCIUM 9.7 01/13/2016 0911   PROT 7.7 01/13/2016 0911   ALBUMIN 3.0 (L) 01/13/2016 0911   AST 14 01/13/2016 0911   ALT 24 01/13/2016 0911   ALKPHOS 174 (H) 01/13/2016 0911   BILITOT 0.43 01/13/2016 0911   GFRNONAA >60 01/04/2016 0504   GFRAA >60 01/04/2016 0504     Impression:  The patient  tolerated radiotherapy.   Plan:  F/u in 10mo. Finish Fluconazole for thrush.   Dexamethasone taper Instructions given to taper until Nov 16. -----------------------------------  [name], MD

## 2016-01-23 NOTE — Progress Notes (Signed)
Debbie Martinez is here for her last fraction of radiation. She reports pain a 5/10 to her lower and mid back. She has been taking 2 Tylenol about every 4-5 hours for this pain with some relief. She ran out of her Percocet last week, and has only been taking the Tylenol for pain relief. She reports feeling tired all over and is using a walker to assist with ambulation. The skin to her radiation site is normal appearing. She is using sonafine cream to her skin at this time, and will switch to a vitamin E cream when completed. She was given a one month follow up appointment.   BP 127/87   Pulse (!) 117   Temp 97.9 F (36.6 C)   Ht 5\' 7"  (1.702 m)   Wt 173 lb (78.5 kg)   LMP 12/23/2015   SpO2 100% Comment: room air  BMI 27.10 kg/m    Wt Readings from Last 3 Encounters:  01/23/16 173 lb (78.5 kg)  01/20/16 172 lb 12.8 oz (78.4 kg)  01/16/16 173 lb (78.5 kg)

## 2016-01-26 ENCOUNTER — Encounter: Payer: Self-pay | Admitting: *Deleted

## 2016-01-26 ENCOUNTER — Telehealth: Payer: Self-pay | Admitting: *Deleted

## 2016-01-26 ENCOUNTER — Other Ambulatory Visit: Payer: Self-pay | Admitting: Hematology and Oncology

## 2016-01-26 NOTE — Telephone Encounter (Signed)
FYI "Alda Berthold with [facility] calling for patient in reference to oral chemotherapy.  She completed radiation on Monday and would like to know if she is to restart the oral chemotherapy."  Alda Berthold will call patient to determine which medication and call back with this medication name. (Letrozole, Ribociclib, Ibrance)

## 2016-01-26 NOTE — Progress Notes (Signed)
[place] [employer] representative received follow up call from patient. Pt currently has moved from third floor apartment into her ex-husband's home as her cancer has made mobility very difficult and she can no longer manage the three flights of stairs that were needed to get to her apartment. Pt is hopeful that she can be allowed out of her lease due to her current disability. Pt reports MD's RN working on letter to assist with this, plans to pick up at her appointment tomorrow. CSW reviewed other possible needs that pt may have. She does report financial hardship and reports she had the grant in the past and thinks she still has funds available. CSW encouraged pt to follow up with fin advocate and provided contact info for [facility]. CSW reviewed other resources that could assist pt, such as Cancer [facility] and pt plans to apply for assistance. Pt also plans to reach out to medicaid transportation for possible help in this area as well.   Pt reports her 29 yo daughter, Gregary Signs is adjusting to her illness and is concerned about her. She reports she has contacted her daughter's school counselor as well. CSW also suggested services through [facility] for counseling/group support for her daughter. Pt herself shared anxiety and depression were issues for her currently. She is worried about starting her oral chemo and CSW discussed common emotions/coping techniques. Pt interested in [facility] and agrees to [facility] completing referral. CSW also encouraged pt to consider group support through [facility]. Pt open to additional counseling an support. Pt to contact Cancer Care and reach out to CSW team to assist with application once received. CSW team to follow.    Clinical Social Work interventions: Supportive Psychiatric nurse education and referral  Loren Racer, [place] Worker [place]  [place] Phone: [phone] Fax: [phone]

## 2016-01-27 ENCOUNTER — Encounter: Payer: Self-pay | Admitting: Nurse Practitioner

## 2016-01-27 ENCOUNTER — Ambulatory Visit (HOSPITAL_BASED_OUTPATIENT_CLINIC_OR_DEPARTMENT_OTHER): Payer: Medicaid Other

## 2016-01-27 ENCOUNTER — Telehealth: Payer: Self-pay | Admitting: *Deleted

## 2016-01-27 ENCOUNTER — Other Ambulatory Visit (HOSPITAL_BASED_OUTPATIENT_CLINIC_OR_DEPARTMENT_OTHER): Payer: Medicaid Other

## 2016-01-27 ENCOUNTER — Ambulatory Visit (HOSPITAL_BASED_OUTPATIENT_CLINIC_OR_DEPARTMENT_OTHER): Payer: Medicaid Other | Admitting: Nurse Practitioner

## 2016-01-27 VITALS — BP 135/83 | HR 100 | Temp 98.9°F | Resp 18

## 2016-01-27 VITALS — BP 117/61 | HR 119 | Resp 21

## 2016-01-27 DIAGNOSIS — C7951 Secondary malignant neoplasm of bone: Secondary | ICD-10-CM

## 2016-01-27 DIAGNOSIS — G893 Neoplasm related pain (acute) (chronic): Secondary | ICD-10-CM | POA: Diagnosis not present

## 2016-01-27 DIAGNOSIS — R030 Elevated blood-pressure reading, without diagnosis of hypertension: Secondary | ICD-10-CM

## 2016-01-27 DIAGNOSIS — Z5111 Encounter for antineoplastic chemotherapy: Secondary | ICD-10-CM

## 2016-01-27 DIAGNOSIS — C50211 Malignant neoplasm of upper-inner quadrant of right female breast: Secondary | ICD-10-CM

## 2016-01-27 DIAGNOSIS — C50919 Malignant neoplasm of unspecified site of unspecified female breast: Secondary | ICD-10-CM

## 2016-01-27 DIAGNOSIS — C50911 Malignant neoplasm of unspecified site of right female breast: Secondary | ICD-10-CM

## 2016-01-27 DIAGNOSIS — E860 Dehydration: Secondary | ICD-10-CM | POA: Insufficient documentation

## 2016-01-27 DIAGNOSIS — I10 Essential (primary) hypertension: Secondary | ICD-10-CM | POA: Diagnosis not present

## 2016-01-27 DIAGNOSIS — R110 Nausea: Secondary | ICD-10-CM

## 2016-01-27 DIAGNOSIS — Z170 Estrogen receptor positive status [ER+]: Secondary | ICD-10-CM

## 2016-01-27 LAB — CBC WITH DIFFERENTIAL/PLATELET
BASO%: 0.2 % (ref 0.0–2.0)
BASOS ABS: 0 10*3/uL (ref 0.0–0.1)
EOS ABS: 0 10*3/uL (ref 0.0–0.5)
EOS%: 0.4 % (ref 0.0–7.0)
HEMATOCRIT: 31.6 % — AB (ref 34.8–46.6)
HEMOGLOBIN: 10 g/dL — AB (ref 11.6–15.9)
LYMPH#: 0.3 10*3/uL — AB (ref 0.9–3.3)
LYMPH%: 4.8 % — ABNORMAL LOW (ref 14.0–49.7)
MCH: 26 pg (ref 25.1–34.0)
MCHC: 31.6 g/dL (ref 31.5–36.0)
MCV: 82.1 fL (ref 79.5–101.0)
MONO#: 0.6 10*3/uL (ref 0.1–0.9)
MONO%: 11.5 % (ref 0.0–14.0)
NEUT#: 4.6 10*3/uL (ref 1.5–6.5)
NEUT%: 83.1 % — AB (ref 38.4–76.8)
NRBC: 2 % — AB (ref 0–0)
Platelets: 133 10*3/uL — ABNORMAL LOW (ref 145–400)
RBC: 3.85 10*6/uL (ref 3.70–5.45)
RDW: 16.8 % — AB (ref 11.2–14.5)
WBC: 5.5 10*3/uL (ref 3.9–10.3)

## 2016-01-27 LAB — COMPREHENSIVE METABOLIC PANEL
ALBUMIN: 3.2 g/dL — AB (ref 3.5–5.0)
ALK PHOS: 245 U/L — AB (ref 40–150)
ALT: 18 U/L (ref 0–55)
AST: 23 U/L (ref 5–34)
Anion Gap: 10 mEq/L (ref 3–11)
BILIRUBIN TOTAL: 0.36 mg/dL (ref 0.20–1.20)
BUN: 12.8 mg/dL (ref 7.0–26.0)
CALCIUM: 9.5 mg/dL (ref 8.4–10.4)
CO2: 26 mEq/L (ref 22–29)
Chloride: 98 mEq/L (ref 98–109)
Creatinine: 0.7 mg/dL (ref 0.6–1.1)
Glucose: 161 mg/dl — ABNORMAL HIGH (ref 70–140)
POTASSIUM: 3.8 meq/L (ref 3.5–5.1)
Sodium: 135 mEq/L — ABNORMAL LOW (ref 136–145)
TOTAL PROTEIN: 7.2 g/dL (ref 6.4–8.3)

## 2016-01-27 LAB — TECHNOLOGIST REVIEW

## 2016-01-27 MED ORDER — SODIUM CHLORIDE 0.9 % IV SOLN
8.0000 mg | Freq: Once | INTRAVENOUS | Status: AC
  Administered 2016-01-27: 8 mg via INTRAVENOUS
  Filled 2016-01-27: qty 4

## 2016-01-27 MED ORDER — GOSERELIN ACETATE 3.6 MG ~~LOC~~ IMPL
3.6000 mg | DRUG_IMPLANT | SUBCUTANEOUS | Status: AC
  Administered 2016-01-27: 3.6 mg via SUBCUTANEOUS
  Filled 2016-01-27: qty 3.6

## 2016-01-27 MED ORDER — OXYCODONE-ACETAMINOPHEN 5-325 MG PO TABS
1.0000 | ORAL_TABLET | Freq: Once | ORAL | Status: AC
Start: 2016-01-27 — End: 2016-01-27
  Administered 2016-01-27: 1 via ORAL

## 2016-01-27 MED ORDER — SODIUM CHLORIDE 0.9 % IV SOLN: INTRAVENOUS | Status: AC

## 2016-01-27 MED ORDER — OXYCODONE-ACETAMINOPHEN 5-325 MG PO TABS
ORAL_TABLET | ORAL | Status: AC
  Filled 2016-01-27: qty 1

## 2016-01-27 MED ORDER — SODIUM CHLORIDE 0.9 % IV SOLN
Freq: Once | INTRAVENOUS | Status: AC
  Administered 2016-01-27: 11:00:00 via INTRAVENOUS
  Filled 2016-01-27: qty 4

## 2016-01-27 MED ORDER — SODIUM CHLORIDE 0.9 % IV SOLN
1000.0000 mL | INTRAVENOUS | Status: AC
  Administered 2016-01-27: 1000 mL via INTRAVENOUS

## 2016-01-27 MED ORDER — DENOSUMAB 120 MG/1.7ML ~~LOC~~ SOLN
120.0000 mg | Freq: Once | SUBCUTANEOUS | Status: AC
  Administered 2016-01-27: 120 mg via SUBCUTANEOUS
  Filled 2016-01-27: qty 1.7

## 2016-01-27 NOTE — Assessment & Plan Note (Signed)
Patient has diagnosed bone metastasis with spinal involvement.  She typically takes pain medication and muscle relaxants on a regular basis.  She is complaining of back pain while at the cancer center; was given Percocet.  She appeared much more comfortable after receiving her pain medications and her nausea/IV fluid rehydration.

## 2016-01-27 NOTE — Assessment & Plan Note (Signed)
Patient presented to the [facility] today to receive her first cycle of Zoladex and Xgeva injections.  She also plans to start taking the letrozole oral therapy today as well.  She confirmed that she started taking the dexamethasone as directed.  She plans to initiate the Ribociclib oral therapy this evening as well. She will take the Ribociclib daily 3 weeks on and one week off.  She is scheduled to return for labs and a follow-up visit on 02/10/2016.

## 2016-01-27 NOTE — Progress Notes (Signed)
SYMPTOM MANAGEMENT CLINIC    Chief Complaint: Nausea, dehydration, pain  HPI:  Debbie Martinez 40 y.o. female diagnosed with breast cancer with bone metastasis.  Currently undergoing Zoladex and xgeva injections; as well as Ribociclib oral thearpy.  Will also initiate Letrozole today as well.      Breast cancer of upper-inner quadrant of right female breast ([place])   01/12/2014 Mammogram    Right breast - two lesions: #1 2:00 position 1.6 x 1.9 cm and #2 12:00 position 0.5 x 0.9 cm. Distance between the 2 was 4.5 cm, right axillary lymph node enlargement      01/12/2014 Initial Biopsy    Right breast 3 biopsies: All were IDC with DCIS ER+ PR + Ki-67 85% HER-2 negative: Right axillary lymph node also positive for metastatic cancer ER positive HER-2 negative Ki-67 80% grade 2      01/18/2014 Breast MRI    Right breast 12:00 position 2.8 x 2 x 2.2 cm: 2:00 position 1.3 x 0.8 x 0.5 cm, right axilla multiple enlarged lymph nodes 1 cm in size, right retropectoral lymph node 0.6 cm      01/18/2014 Clinical Stage    Stage IIB T2 N1      01/25/2014 Procedure    Breast/Ovarian ([facility]) reveals no clinically significant variant at ATM, BARD1, BRCA1, BRCA2, BRIP1, CDH1, CHEK2, EPCAM, FANCC, MLH1, MSH2, MSH6, NBN, PALB2, PMS2, PTEN, RAD51C, RAD51D, TP53, and XRCC2.       02/11/2014 -  Neo-Adjuvant Chemotherapy    Doxorubicin and cyclophosphamide X 4 followed by Taxol weekly x12       07/12/2014 Breast MRI    Partial response to neoadjuvant chemotherapy right breast mass decreased from 2.8 cm to 2.3 cm, 2 small masses along the medial margin also decreased; significant decrease right breast mass 11 to 12:00 and 13 mm to 8 mm, decrease in right axilla LN      08/23/2014 Definitive Surgery    Right lumpectomy/SLNB [name]) 2:00: IDC grade 3, 3.2 cm, high-grade DCIS, LV I present, PNI present,; 12:00: IDC grade 30.8 cm, high-grade DCIS, LV I, 4/4 lymph nodes positive with ECE, ER 90%, PR 40%, HER-2  negative margin positive      08/23/2014 Pathologic Stage    Stage IIIA: ypT2 ypN2a      09/15/2014 Surgery    Reexcision of margins: clear; no evidence of malignancy      11/03/2014 - 12/14/2014 Radiation Therapy    Adjuvant XRT Isidore Moos): Right Breast, supraclavicular nodes, axillary nodes, and internal mammary nodes: 50 Gy over 25 fractions; right breast boost: 10 Gy over 5 fractions. Total dose: 60 Gy      12/29/2014 -  Anti-estrogen oral therapy    Tamoxifen 20 mg daily      04/08/2015 Survivorship    Survivorship care plan completed and mailed to patient in lieu of in person visit at her request      12/31/2015 Relapse/Recurrence    Innumerable osseous metastases with acute pathologic fracture the L4 vertebral body. 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 L5-S1 protrusion with left more than right S1 impingement      01/05/2016 - 01/12/2016 Radiation Therapy    Palliative radiation to the spine for cord compression       Review of Systems  Constitutional: Positive for malaise/fatigue and weight loss.  Gastrointestinal: Positive for nausea.  Musculoskeletal: Positive for back pain, myalgias and neck pain.  All other systems reviewed and are negative.   Past  Medical History:  Diagnosis Date  . Anxiety    gets sweaty and short of breath  . Breast cancer (HCC) 01/20/14   triple positive  . GERD (gastroesophageal reflux disease)    does not take anything  . Gestational diabetes   . Hypertension   . Shortness of breath    'anxiety'    Past Surgical History:  Procedure Laterality Date  . BREAST LUMPECTOMY WITH NEEDLE LOCALIZATION AND AXILLARY SENTINEL LYMPH NODE BX Right 08/23/2014   Procedure: RIGHT BREAST LUMPECTOMY WITH NEEDLE LOCALIZATION(X'S 2)AND RIGHT AXILLARY SENTINEL LYMPH NODE Biopies;  Surgeon: Chevis Pretty III, MD;  Location: [facility] OR;  Service: General;  Laterality: Right;  . CESAREAN SECTION     2008  . KELOID EXCISION Left    ear    . PORTACATH PLACEMENT Left 01/26/2014   Procedure: INSERTION PORT-A-CATH;  Surgeon: Chevis Pretty III, MD;  Location: [facility] OR;  Service: General;  Laterality: Left;  . RE-EXCISION OF BREAST CANCER,SUPERIOR MARGINS Right 09/15/2014   Procedure: RE-EXCISION OF RIGHT BREAST CANCER,SUPERIOR AND INFERIOR MARGINS;  Surgeon: Chevis Pretty III, MD;  Location: [facility];  Service: General;  Laterality: Right;    has Breast cancer of upper-inner quadrant of right female breast (HCC); Chemotherapy induced nausea and vomiting; Antineoplastic chemotherapy induced anemia; Constipation; Genetic testing; Breast carcinoma metastatic to multiple sites [facility]); Back pain; Hypertension; Anxiety; Cord compression (HCC); Bone metastasis (HCC); Nausea without vomiting; Cancer associated pain; and Dehydration on her problem list.    is allergic to lisinopril and tamoxifen.    Medication List       Accurate as of 01/27/16  7:54 PM. Always use your most recent med list.          atenolol 50 MG tablet Commonly known as:  TENORMIN Take 50 mg by mouth daily.   blood glucose meter kit and supplies Kit Dispense based on patient and insurance preference. Use up to four times daily as directed. (FOR ICD-9 250.00, 250.01).   dexamethasone 4 MG tablet Commonly known as:  DECADRON Take 1 tablet (4 mg total) by mouth 3 (three) times daily. Taper to 1 tablet 2 times daily on Sat, 01-14-16.   diazepam 5 MG tablet Commonly known as:  VALIUM Take 1 tablet (5 mg total) by mouth 2 (two) times daily.   DOANS PILLS PO Take 2 tablets by mouth daily as needed (for pain).   ENSURE Take 237 mLs by mouth 2 (two) times daily between meals.   fluconazole 100 MG tablet Commonly known as:  DIFLUCAN Take 2 tablets today, then 1 tablet daily x 20 more days.   Geritol Liqd Take 5 mLs by mouth daily.   letrozole 2.5 MG tablet Commonly known as:  FEMARA Take 1 tablet (2.5 mg total) by mouth daily.   metFORMIN 500 MG  tablet Commonly known as:  GLUCOPHAGE Take 1 tablet (500 mg total) by mouth 2 (two) times daily with a meal.   methocarbamol 500 MG tablet Commonly known as:  ROBAXIN Take 1 tablet (500 mg total) by mouth 2 (two) times daily as needed for muscle spasms.   omeprazole 20 MG capsule Commonly known as:  PRILOSEC Take 1 capsule (20 mg total) by mouth daily. Take while using dexamethasone to prevent heartburn.   oxyCODONE-acetaminophen 5-325 MG tablet Commonly known as:  PERCOCET/ROXICET Take 1 tablet by mouth every 6 (six) hours as needed for severe pain.   polyethylene glycol packet Commonly known as:  MIRALAX / GLYCOLAX Take 17  g by mouth daily.   Ribociclib Succinate 200 MG Tabs Take 600 mg by mouth daily. 3 weeks on 1 week off   traMADol 50 MG tablet Commonly known as:  ULTRAM Take 50-100 mg by mouth every 6 (six) hours as needed (for pain).   VITAMIN D-3 PO Take 1 capsule by mouth daily.        PHYSICAL EXAMINATION  Oncology Vitals 01/27/2016 01/27/2016  Height - -  Weight - -  Weight (lbs) - -  BMI (kg/m2) - -  Temp - -  Pulse 119 121  Resp - 21  SpO2 - 98  BSA (m2) - -   BP Readings from Last 2 Encounters:  01/27/16 117/61  01/27/16 135/83    Physical Exam  Constitutional: She is oriented to person, place, and time and well-developed, well-nourished, and in no distress.  HENT:  Head: Normocephalic and atraumatic.  Mouth/Throat: Oropharynx is clear and moist.  Eyes: Conjunctivae and EOM are normal. Pupils are equal, round, and reactive to light. Right eye exhibits no discharge. Left eye exhibits no discharge. No scleral icterus.  Neck: Normal range of motion. Neck supple. No JVD present. No tracheal deviation present. No thyromegaly present.  Cardiovascular: Normal rate, regular rhythm, normal heart sounds and intact distal pulses.   Pulmonary/Chest: Effort normal and breath sounds normal. No respiratory distress. She has no wheezes. She has no rales. She  exhibits no tenderness.  Abdominal: Soft. Bowel sounds are normal. She exhibits no distension and no mass. There is no tenderness. There is no rebound and no guarding.  Musculoskeletal: Normal range of motion. She exhibits no edema or tenderness.  Lymphadenopathy:    She has no cervical adenopathy.  Neurological: She is alert and oriented to person, place, and time. Gait normal.  Skin: Skin is warm and dry. No rash noted. No erythema. No pallor.  Psychiatric: Affect normal.  Nursing note and vitals reviewed.   LABORATORY DATA:. Appointment on 01/27/2016  Component Date Value Ref Range Status  . WBC 01/27/2016 5.5  3.9 - 10.3 10e3/uL Final  . NEUT# 01/27/2016 4.6  1.5 - 6.5 10e3/uL Final  . HGB 01/27/2016 10.0* 11.6 - 15.9 g/dL Final  . HCT 01/27/2016 31.6* 34.8 - 46.6 % Final  . Platelets 01/27/2016 133* 145 - 400 10e3/uL Final  . MCV 01/27/2016 82.1  79.5 - 101.0 fL Final  . MCH 01/27/2016 26.0  25.1 - 34.0 pg Final  . MCHC 01/27/2016 31.6  31.5 - 36.0 g/dL Final  . RBC 01/27/2016 3.85  3.70 - 5.45 10e6/uL Final  . RDW 01/27/2016 16.8* 11.2 - 14.5 % Final  . lymph# 01/27/2016 0.3* 0.9 - 3.3 10e3/uL Final  . MONO# 01/27/2016 0.6  0.1 - 0.9 10e3/uL Final  . Eosinophils Absolute 01/27/2016 0.0  0.0 - 0.5 10e3/uL Final  . Basophils Absolute 01/27/2016 0.0  0.0 - 0.1 10e3/uL Final  . NEUT% 01/27/2016 83.1* 38.4 - 76.8 % Final  . LYMPH% 01/27/2016 4.8* 14.0 - 49.7 % Final  . MONO% 01/27/2016 11.5  0.0 - 14.0 % Final  . EOS% 01/27/2016 0.4  0.0 - 7.0 % Final  . BASO% 01/27/2016 0.2  0.0 - 2.0 % Final  . nRBC 01/27/2016 2* 0 - 0 % Final  . Sodium 01/27/2016 135* 136 - 145 mEq/L Final  . Potassium 01/27/2016 3.8  3.5 - 5.1 mEq/L Final  . Chloride 01/27/2016 98  98 - 109 mEq/L Final  . CO2 01/27/2016 26  22 - 29 mEq/L  Final  . Glucose 01/27/2016 161* 70 - 140 mg/dl Final  . BUN 01/27/2016 12.8  7.0 - 26.0 mg/dL Final  . Creatinine 01/27/2016 0.7  0.6 - 1.1 mg/dL Final  . Total  Bilirubin 01/27/2016 0.36  0.20 - 1.20 mg/dL Final  . Alkaline Phosphatase 01/27/2016 245* 40 - 150 U/L Final  . AST 01/27/2016 23  5 - 34 U/L Final  . ALT 01/27/2016 18  0 - 55 U/L Final  . Total Protein 01/27/2016 7.2  6.4 - 8.3 g/dL Final  . Albumin 01/27/2016 3.2* 3.5 - 5.0 g/dL Final  . Calcium 01/27/2016 9.5  8.4 - 10.4 mg/dL Final  . Anion Gap 01/27/2016 10  3 - 11 mEq/L Final  . EGFR 01/27/2016 >90  >90 ml/min/1.73 m2 Final  . Technologist Review 01/27/2016 Few Metas and Myelocytes present   Final    RADIOGRAPHIC STUDIES: No results found.  ASSESSMENT/PLAN:    Nausea without vomiting Patient states that she has had decreased appetite; and has had very poor oral intake recently.  She presented to the [facility] today with blood pressure on initial check done at 96/52 and an elevated heart rate.  She appeared mildly dehydrated and was uncomfortable.  She was nauseous and dry heaving.  Patient was given 1 L normal saline IV fluid rehydration today.  Sodium was down to 135.  With further discussion, it was discovered that patient has no previous history of being a diabetic-but has been experiencing hyperglycemia since initiating her steroid therapy.  She currently takes 1 dexamethasone tablet every day.  She stated that she plans to decrease the dexamethasone down to half a tablet next month.  She takes metformin on a daily basis.  She states that she took the metformin earlier this morning; he did not eat any breakfast.  Once she had received both of her injections this morning-she became nauseous and was dry heaving.  She's also has little oral intake and feels dehydrated.  Patient was given Zofran IV; and IV fluid rehydration.  She felt much better following the Zofran and the IV fluids.  She was able to eat a snack and drink fluids with no further difficulty.  She was driven home by her husband today.  Prior to discharge from the [facility] today-patient was eating a snack and  drinking fluids with no difficulty.  She was encouraged to push fluids at home.  Confirmed the patient does have anti--nausea medications at home to use on an as-needed basis.  Hypertension Patient has a history of hypertension; and typically takes atenolol 50 mg on a daily basis.  However, patient states that she only has about 3 of her atenolol tablets left; and her physician has left the practice.  She has been unable to get an appointment with a primary care physician as a replacement as of yet.  Patient was dehydrated and nauseous today.  Her blood pressure on initial check was 96/52.  Advised patient that she may very well need labs of the atenolol/dose adjustment of her depression medications in the future-since she is undergoing immunotherapy as of today.  Advised patient to obtain a new primary care physician to manage her blood pressure and her diabetes issues.  Very soon.  In the meantime-patient will hold her atenolol; and keep a blood pressure log to bring with her at her next visit.  She was also encouraged to go directly to the emergency department for any worsening symptoms whatsoever.  Dehydration Patient has had little  oral intake and appear dehydrated today.  Blood pressure was down to 96/52 and heart rate was elevated.  Sodium was 135.  Patient received IV fluid rehydration; as well as Zofran IV today.  Patient appeared to feel much better after IV fluids; was able to sit up and have a snack and drink fluids with no difficulty.  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  She typically takes pain medication and muscle relaxants on a regular basis.  She is complaining of back pain while at the cancer center; was given Percocet.  She appeared much more comfortable after receiving her pain medications and her nausea/IV fluid rehydration.  Breast cancer of upper-inner quadrant of right female breast [facility]) Patient presented to the [facility]  today to receive her first cycle of Zoladex and Xgeva injections.  She also plans to start taking the letrozole oral therapy today as well.  She confirmed that she started taking the dexamethasone as directed.  She plans to initiate the Ribociclib oral therapy this evening as well. She will take the Ribociclib daily 3 weeks on and one week off.  She is scheduled to return for labs and a follow-up visit on 02/10/2016.   Patient stated understanding of all instructions; and was in agreement with this plan of care. The patient knows to call the clinic with any problems, questions or concerns.   Total time spent with patient was 40 minutes;  with greater than 75 percent of that time spent in face to face counseling regarding patient's symptoms,  and coordination of care and follow up.  Disclaimer:This dictation was prepared with Dragon/digital dictation along with [employer]. Any transcriptional errors that result from this process are unintentional.  Drue Second, NP 01/27/2016

## 2016-01-27 NOTE — Telephone Encounter (Signed)
Spoke with pt in lobby about current oral chemo regimen as note from yesterday indicated confusion.  According to chart review, pt to p/u Kisquali/Femara from [facility] once available and start medication today.  Called [facility] who stated she had picked up [facility].  Pt verbalized understanding to start Kisquali/Femara today as dicussed.  Pt also to start Faslodex/Xgeva today.  Pt verbalized understanding and is without further questions or concerns at this time.  Gave pt letter for housing stating she could not live on any level other than first floor.  Pt instructed to call us if she needs anything additional in helping her with this process.

## 2016-01-27 NOTE — Assessment & Plan Note (Addendum)
Patient states that she has had decreased appetite; and has had very poor oral intake recently.  She presented to the [facility] today with blood pressure on initial check done at 96/52 and an elevated heart rate.  She appeared mildly dehydrated and was uncomfortable.  She was nauseous and dry heaving.  Patient was given 1 L normal saline IV fluid rehydration today.  Sodium was down to 135.  With further discussion, it was discovered that patient has no previous history of being a diabetic-but has been experiencing hyperglycemia since initiating her steroid therapy.  She currently takes 1 dexamethasone tablet every day.  She stated that she plans to decrease the dexamethasone down to half a tablet next month.  She takes metformin on a daily basis.  She states that she took the metformin earlier this morning; he did not eat any breakfast.  Once she had received both of her injections this morning-she became nauseous and was dry heaving.  She's also has little oral intake and feels dehydrated.  Patient was given Zofran IV; and IV fluid rehydration.  She felt much better following the Zofran and the IV fluids.  She was able to eat a snack and drink fluids with no further difficulty.  She was driven home by her husband today.  Prior to discharge from the [facility] today-patient was eating a snack and drinking fluids with no difficulty.  She was encouraged to push fluids at home.  Confirmed the patient does have anti--nausea medications at home to use on an as-needed basis.

## 2016-01-27 NOTE — Patient Instructions (Signed)

## 2016-01-27 NOTE — Assessment & Plan Note (Signed)
Patient has a history of hypertension; and typically takes atenolol 50 mg on a daily basis.  However, patient states that she only has about 3 of her atenolol tablets left; and her physician has left the practice.  She has been unable to get an appointment with a primary care physician as a replacement as of yet.  Patient was dehydrated and nauseous today.  Her blood pressure on initial check was 96/52.  Advised patient that she may very well need labs of the atenolol/dose adjustment of her depression medications in the future-since she is undergoing immunotherapy as of today.  Advised patient to obtain a new primary care physician to manage her blood pressure and her diabetes issues.  Very soon.  In the meantime-patient will hold her atenolol; and keep a blood pressure log to bring with her at her next visit.  She was also encouraged to go directly to the emergency department for any worsening symptoms whatsoever.

## 2016-01-27 NOTE — Assessment & Plan Note (Signed)
Patient has had little oral intake and appear dehydrated today.  Blood pressure was down to 96/52 and heart rate was elevated.  Sodium was 135.  Patient received IV fluid rehydration; as well as Zofran IV today.  Patient appeared to feel much better after IV fluids; was able to sit up and have a snack and drink fluids with no difficulty.

## 2016-01-27 NOTE — Telephone Encounter (Signed)
Pt called asking if a letter is ready to pick up.  She was calling from our lobby and apparently someone brought her the letter as we were speaking.

## 2016-01-27 NOTE — Patient Instructions (Signed)
Goserelin injection What is this medicine? GOSERELIN (GOE se rel in) is similar to a hormone found in the body. It lowers the amount of sex hormones that the body makes. Men will have lower testosterone levels and women will have lower estrogen levels while taking this medicine. In men, this medicine is used to treat prostate cancer; the injection is either given once per month or once every 12 weeks. A once per month injection (only) is used to treat women with endometriosis, dysfunctional uterine bleeding, or advanced breast cancer. This medicine may be used for other purposes; ask your health care provider or pharmacist if you have questions. What should I tell my health care provider before I take this medicine? They need to know if you have any of these conditions (some only apply to women): -diabetes -heart disease or previous heart attack -high blood pressure -high cholesterol -kidney disease -osteoporosis or low bone density -problems passing urine -spinal cord injury -stroke -tobacco smoker -an unusual or allergic reaction to goserelin, hormone therapy, other medicines, foods, dyes, or preservatives -pregnant or trying to get pregnant -breast-feeding How should I use this medicine? This medicine is for injection under the skin. It is given by a health care professional in a hospital or clinic setting. Men receive this injection once every 4 weeks or once every 12 weeks. Women will only receive the once every 4 weeks injection. Talk to your pediatrician regarding the use of this medicine in children. Special care may be needed. Overdosage: If you think you have taken too much of this medicine contact a poison control center or emergency room at once. NOTE: This medicine is only for you. Do not share this medicine with others. What if I miss a dose? It is important not to miss your dose. Call your doctor or health care professional if you are unable to keep an appointment. What may  interact with this medicine? -female hormones like estrogen -herbal or dietary supplements like black cohosh, chasteberry, or DHEA -female hormones like testosterone -prasterone This list may not describe all possible interactions. Give your health care provider a list of all the medicines, herbs, non-prescription drugs, or dietary supplements you use. Also tell them if you smoke, drink alcohol, or use illegal drugs. Some items may interact with your medicine. What should I watch for while using this medicine? Visit your doctor or health care professional for regular checks on your progress. Your symptoms may appear to get worse during the first weeks of this therapy. Tell your doctor or healthcare professional if your symptoms do not start to get better or if they get worse after this time. Your bones may get weaker if you take this medicine for a long time. If you smoke or frequently drink alcohol you may increase your risk of bone loss. A family history of osteoporosis, chronic use of drugs for seizures (convulsions), or corticosteroids can also increase your risk of bone loss. Talk to your doctor about how to keep your bones strong. This medicine should stop regular monthly menstration in women. Tell your doctor if you continue to menstrate. Women should not become pregnant while taking this medicine or for 12 weeks after stopping this medicine. Women should inform their doctor if they wish to become pregnant or think they might be pregnant. There is a potential for serious side effects to an unborn child. Talk to your health care professional or pharmacist for more information. Do not breast-feed an infant while taking this medicine. Men should   inform their doctors if they wish to father a child. This medicine may lower sperm counts. Talk to your health care professional or pharmacist for more information. What side effects may I notice from receiving this medicine? Side effects that you should  report to your doctor or health care professional as soon as possible: -allergic reactions like skin rash, itching or hives, swelling of the face, lips, or tongue -bone pain -breathing problems -changes in vision -chest pain -feeling faint or lightheaded, falls -fever, chills -pain, swelling, warmth in the leg -pain, tingling, numbness in the hands or feet -signs and symptoms of low blood pressure like dizziness; feeling faint or lightheaded, falls; unusually weak or tired -stomach pain -swelling of the ankles, feet, hands -trouble passing urine or change in the amount of urine -unusually high or low blood pressure -unusually weak or tired Side effects that usually do not require medical attention (report to your doctor or health care professional if they continue or are bothersome): -change in sex drive or performance -changes in breast size in both males and females -changes in emotions or moods -headache -hot flashes -irritation at site where injected -loss of appetite -skin problems like acne, dry skin -vaginal dryness This list may not describe all possible side effects. Call your doctor for medical advice about side effects. You may report side effects to FDA at 1-800-FDA-1088. Where should I keep my medicine? This drug is given in a hospital or clinic and will not be stored at home. NOTE: This sheet is a summary. It may not cover all possible information. If you have questions about this medicine, talk to your doctor, pharmacist, or health care provider.    2016, [employer] Standard. (2013-06-16 11:10:35) Denosumab injection What is this medicine? DENOSUMAB (den oh sue mab) slows bone breakdown. Prolia is used to treat osteoporosis in women after menopause and in men. Delton See is used to prevent bone fractures and other bone problems caused by cancer bone metastases. Delton See is also used to treat giant cell tumor of the bone. This medicine may be used for other purposes; ask  your health care provider or pharmacist if you have questions. What should I tell my health care provider before I take this medicine? They need to know if you have any of these conditions: -dental disease -eczema -infection or history of infections -kidney disease or on dialysis -low blood calcium or vitamin D -malabsorption syndrome -scheduled to have surgery or tooth extraction -taking medicine that contains denosumab -thyroid or parathyroid disease -an unusual reaction to denosumab, other medicines, foods, dyes, or preservatives -pregnant or trying to get pregnant -breast-feeding How should I use this medicine? This medicine is for injection under the skin. It is given by a health care professional in a hospital or clinic setting. If you are getting Prolia, a special MedGuide will be given to you by the pharmacist with each prescription and refill. Be sure to read this information carefully each time. For Prolia, talk to your pediatrician regarding the use of this medicine in children. Special care may be needed. For Delton See, talk to your pediatrician regarding the use of this medicine in children. While this drug may be prescribed for children as young as 13 years for selected conditions, precautions do apply. Overdosage: If you think you have taken too much of this medicine contact a poison control center or emergency room at once. NOTE: This medicine is only for you. Do not share this medicine with others. What if I miss a dose?  It is important not to miss your dose. Call your doctor or health care professional if you are unable to keep an appointment. What may interact with this medicine? Do not take this medicine with any of the following medications: -other medicines containing denosumab This medicine may also interact with the following medications: -medicines that suppress the immune system -medicines that treat cancer -steroid medicines like prednisone or cortisone This list  may not describe all possible interactions. Give your health care provider a list of all the medicines, herbs, non-prescription drugs, or dietary supplements you use. Also tell them if you smoke, drink alcohol, or use illegal drugs. Some items may interact with your medicine. What should I watch for while using this medicine? Visit your doctor or health care professional for regular checks on your progress. Your doctor or health care professional may order blood tests and other tests to see how you are doing. Call your doctor or health care professional if you get a cold or other infection while receiving this medicine. Do not treat yourself. This medicine may decrease your body's ability to fight infection. You should make sure you get enough calcium and vitamin D while you are taking this medicine, unless your doctor tells you not to. Discuss the foods you eat and the vitamins you take with your health care professional. See your dentist regularly. Brush and floss your teeth as directed. Before you have any dental work done, tell your dentist you are receiving this medicine. Do not become pregnant while taking this medicine or for 5 months after stopping it. Women should inform their doctor if they wish to become pregnant or think they might be pregnant. There is a potential for serious side effects to an unborn child. Talk to your health care professional or pharmacist for more information. What side effects may I notice from receiving this medicine? Side effects that you should report to your doctor or health care professional as soon as possible: -allergic reactions like skin rash, itching or hives, swelling of the face, lips, or tongue -breathing problems -chest pain -fast, irregular heartbeat -feeling faint or lightheaded, falls -fever, chills, or any other sign of infection -muscle spasms, tightening, or twitches -numbness or tingling -skin blisters or bumps, or is dry, peels, or red -slow  healing or unexplained pain in the mouth or jaw -unusual bleeding or bruising Side effects that usually do not require medical attention (Report these to your doctor or health care professional if they continue or are bothersome.): -muscle pain -stomach upset, gas This list may not describe all possible side effects. Call your doctor for medical advice about side effects. You may report side effects to FDA at 1-800-FDA-1088. Where should I keep my medicine? This medicine is only given in a clinic, doctor's office, or other health care setting and will not be stored at home. NOTE: This sheet is a summary. It may not cover all possible information. If you have questions about this medicine, talk to your doctor, pharmacist, or health care provider.    2016, [employer] Standard. (2011-10-08 12:37:47)

## 2016-02-07 ENCOUNTER — Telehealth: Payer: Self-pay | Admitting: Pharmacist

## 2016-02-07 NOTE — Telephone Encounter (Signed)
Oral Chemotherapy Follow-Up Attempted to reach patient for follow up on oral medication: Ribociclib. No answer. Left VM for patient to call back with any questions or issues.   Thank you,  Nuala Alpha, PharmD [facility] [phone]

## 2016-02-08 NOTE — Progress Notes (Signed)
  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      Plan: The patient has completed radiation treatment. The patient will return to radiation oncology clinic for routine followup in one month. I advised them to call or return sooner if they have any questions or concerns related to their recovery or treatment.  -----------------------------------  [name], MD

## 2016-02-10 ENCOUNTER — Ambulatory Visit: Payer: Medicaid Other | Admitting: Hematology and Oncology

## 2016-02-10 ENCOUNTER — Other Ambulatory Visit: Payer: Medicaid Other

## 2016-02-13 ENCOUNTER — Ambulatory Visit (HOSPITAL_BASED_OUTPATIENT_CLINIC_OR_DEPARTMENT_OTHER): Payer: Medicaid Other | Admitting: Hematology and Oncology

## 2016-02-13 ENCOUNTER — Telehealth: Payer: Self-pay | Admitting: Hematology and Oncology

## 2016-02-13 ENCOUNTER — Other Ambulatory Visit: Payer: Self-pay

## 2016-02-13 ENCOUNTER — Encounter: Payer: Self-pay | Admitting: Hematology and Oncology

## 2016-02-13 ENCOUNTER — Other Ambulatory Visit (HOSPITAL_BASED_OUTPATIENT_CLINIC_OR_DEPARTMENT_OTHER): Payer: Medicaid Other

## 2016-02-13 ENCOUNTER — Encounter: Payer: Self-pay | Admitting: *Deleted

## 2016-02-13 VITALS — BP 153/95 | HR 106 | Temp 98.4°F | Resp 18 | Ht 67.0 in | Wt 174.8 lb

## 2016-02-13 DIAGNOSIS — Z170 Estrogen receptor positive status [ER+]: Secondary | ICD-10-CM

## 2016-02-13 DIAGNOSIS — C7951 Secondary malignant neoplasm of bone: Secondary | ICD-10-CM | POA: Diagnosis not present

## 2016-02-13 DIAGNOSIS — Z7189 Other specified counseling: Secondary | ICD-10-CM

## 2016-02-13 DIAGNOSIS — Z79811 Long term (current) use of aromatase inhibitors: Secondary | ICD-10-CM | POA: Diagnosis not present

## 2016-02-13 DIAGNOSIS — C50211 Malignant neoplasm of upper-inner quadrant of right female breast: Secondary | ICD-10-CM

## 2016-02-13 DIAGNOSIS — G9520 Unspecified cord compression: Secondary | ICD-10-CM

## 2016-02-13 LAB — CBC WITH DIFFERENTIAL/PLATELET
BASO%: 0 % (ref 0.0–2.0)
BASOS ABS: 0 10*3/uL (ref 0.0–0.1)
EOS ABS: 0 10*3/uL (ref 0.0–0.5)
EOS%: 0.3 % (ref 0.0–7.0)
HCT: 29.1 % — ABNORMAL LOW (ref 34.8–46.6)
HGB: 9.3 g/dL — ABNORMAL LOW (ref 11.6–15.9)
LYMPH%: 11.1 % — AB (ref 14.0–49.7)
MCH: 27.2 pg (ref 25.1–34.0)
MCHC: 32 g/dL (ref 31.5–36.0)
MCV: 85.1 fL (ref 79.5–101.0)
MONO#: 0.2 10*3/uL (ref 0.1–0.9)
MONO%: 5.1 % (ref 0.0–14.0)
NEUT#: 2.8 10*3/uL (ref 1.5–6.5)
NEUT%: 83.5 % — ABNORMAL HIGH (ref 38.4–76.8)
PLATELETS: 176 10*3/uL (ref 145–400)
RBC: 3.42 10*6/uL — AB (ref 3.70–5.45)
RDW: 20.8 % — AB (ref 11.2–14.5)
WBC: 3.3 10*3/uL — ABNORMAL LOW (ref 3.9–10.3)
lymph#: 0.4 10*3/uL — ABNORMAL LOW (ref 0.9–3.3)
nRBC: 5 % — ABNORMAL HIGH (ref 0–0)

## 2016-02-13 LAB — COMPREHENSIVE METABOLIC PANEL
ALT: 24 U/L (ref 0–55)
ANION GAP: 11 meq/L (ref 3–11)
AST: 10 U/L (ref 5–34)
Albumin: 3.2 g/dL — ABNORMAL LOW (ref 3.5–5.0)
Alkaline Phosphatase: 243 U/L — ABNORMAL HIGH (ref 40–150)
BILIRUBIN TOTAL: 0.35 mg/dL (ref 0.20–1.20)
BUN: 12.3 mg/dL (ref 7.0–26.0)
CHLORIDE: 103 meq/L (ref 98–109)
CO2: 24 meq/L (ref 22–29)
Calcium: 8.8 mg/dL (ref 8.4–10.4)
Creatinine: 0.8 mg/dL (ref 0.6–1.1)
GLUCOSE: 278 mg/dL — AB (ref 70–140)
POTASSIUM: 3.7 meq/L (ref 3.5–5.1)
SODIUM: 137 meq/L (ref 136–145)
Total Protein: 6.8 g/dL (ref 6.4–8.3)

## 2016-02-13 NOTE — Assessment & Plan Note (Signed)
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   Treatment summary:  1. Patient completed neoadjuvant dose dense Adriamycin and Cytoxan and weekly Taxol X 12 started 02/09/2014 and completed 07/01/14 2. Right double lumpectomy 2:00: IDC grade 3, 3.2 cm, high-grade DCIS, LV I present, PNI present,; 12:00: IDC grade 3; 0.8 cm, high-grade DCIS, LVI, 4/4 lymph nodes positive with ECE, ER 90%, PR 40%, HER-2 negative margin positive T2 N2 M0 stage IIIa 3. Patient is on [facility] clinical trial and she was randomized intraoperatively for NO lymph node dissection. 4. Tamoxifen 20 mg daily started 12/29/2014 stopped July 2016 for adverse effects 5. Relapsed/metastatic disease diagnosed 12/31/2015 when she presented with cord compression with innumerable bone metastases with L4 pathological fracture 7. Palliative XRT to the spine for cord compression --------------------------------------------------------------------------------------------------------------------------------------------------------------- Bone Mets: Xgeva with calcium and vitamin D  Current treatment: Ribociclib with letrozole plus ovarian suppression  Ribociclib toxicities:   EKG done today Goals of care were discussed: Palliation Return to clinic in one month for toxicity check and repeat EKG for QT prolongation review

## 2016-02-13 NOTE — Telephone Encounter (Signed)
Message sent to chemo scheduler to add chemo.  AVS report and appointment schedule, given to patient, per 02/13/16 los.

## 2016-02-13 NOTE — Assessment & Plan Note (Signed)
Patient is full code Goal of treatment: Palliation

## 2016-02-13 NOTE — Progress Notes (Signed)
Patient Care Team: Provider Not In System as PCP - El Dara, MD as Consulting Physician (General Surgery) [name], MD as Consulting Physician (Hematology and Oncology) [name], MD as Attending Physician (Radiation Oncology) Sylvan Cheese, NP as Nurse Practitioner (Hematology and Oncology)  DIAGNOSIS:  Encounter Diagnoses  Name Primary?  . Bone metastasis ([place]) Yes  . Cord compression ([place])   . Malignant neoplasm of upper-inner quadrant of right breast in female, estrogen receptor positive ([place])     SUMMARY OF ONCOLOGIC HISTORY:   Breast cancer of upper-inner quadrant of right female breast ([place])   01/12/2014 Mammogram    Right breast - two lesions: #1 2:00 position 1.6 x 1.9 cm and #2 12:00 position 0.5 x 0.9 cm. Distance between the 2 was 4.5 cm, right axillary lymph node enlargement      01/12/2014 Initial Biopsy    Right breast 3 biopsies: All were IDC with DCIS ER+ PR + Ki-67 85% HER-2 negative: Right axillary lymph node also positive for metastatic cancer ER positive HER-2 negative Ki-67 80% grade 2      01/18/2014 Breast MRI    Right breast 12:00 position 2.8 x 2 x 2.2 cm: 2:00 position 1.3 x 0.8 x 0.5 cm, right axilla multiple enlarged lymph nodes 1 cm in size, right retropectoral lymph node 0.6 cm      01/18/2014 Clinical Stage    Stage IIB T2 N1      01/25/2014 Procedure    Breast/Ovarian ([facility]) reveals no clinically significant variant at ATM, BARD1, BRCA1, BRCA2, BRIP1, CDH1, CHEK2, EPCAM, FANCC, MLH1, MSH2, MSH6, NBN, PALB2, PMS2, PTEN, RAD51C, RAD51D, TP53, and XRCC2.       02/11/2014 -  Neo-Adjuvant Chemotherapy    Doxorubicin and cyclophosphamide X 4 followed by Taxol weekly x12       07/12/2014 Breast MRI    Partial response to neoadjuvant chemotherapy right breast mass decreased from 2.8 cm to 2.3 cm, 2 small masses along the medial margin also decreased; significant decrease right breast mass 11 to 12:00 and 13 mm to 8 mm,  decrease in right axilla LN      08/23/2014 Definitive Surgery    Right lumpectomy/SLNB [name]) 2:00: IDC grade 3, 3.2 cm, high-grade DCIS, LV I present, PNI present,; 12:00: IDC grade 30.8 cm, high-grade DCIS, LV I, 4/4 lymph nodes positive with ECE, ER 90%, PR 40%, HER-2 negative margin positive      08/23/2014 Pathologic Stage    Stage IIIA: ypT2 ypN2a      09/15/2014 Surgery    Reexcision of margins: clear; no evidence of malignancy      11/03/2014 - 12/14/2014 Radiation Therapy    Adjuvant XRT Isidore Moos): Right Breast, supraclavicular nodes, axillary nodes, and internal mammary nodes: 50 Gy over 25 fractions; right breast boost: 10 Gy over 5 fractions. Total dose: 60 Gy      12/29/2014 -  Anti-estrogen oral therapy    Tamoxifen 20 mg daily      04/08/2015 Survivorship    Survivorship care plan completed and mailed to patient in lieu of in person visit at her request      12/31/2015 Relapse/Recurrence    Innumerable osseous metastases with acute pathologic fracture the L4 vertebral body. 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 L5-S1 protrusion with left more than right S1 impingement      01/05/2016 - 01/12/2016 Radiation Therapy    Palliative radiation to the spine for cord compression  01/13/2016 -  Anti-estrogen oral therapy    Ribociclib 3 weeks on and 1 week off and Letrozole (with Zolodex until oophorectomy) and Xgeva for bone mets       CHIEF COMPLIANT: follow-up on Ribociclib and letrozole  INTERVAL HISTORY: Debbie Martinez is a 40 year old with above-mentioned history metastatic breast cancer that cord compression and finished radiation therapy. She is currently on oral antiestrogen therapy along with CDK 4 and 6 inhibitor Ribociclib. Apart from fatigue she is tolerating it very well. She does the back pain is improving slowly over time. She has a back brace and walks with the help of a walker. She moved in with her ex-husband so that she  can live downstairs. She appears to be in good spirits today.  REVIEW OF SYSTEMS:   Constitutional: Denies fevers, chills or abnormal weight loss Eyes: Denies blurriness of vision Ears, nose, mouth, throat, and face: Denies mucositis or sore throat Respiratory: Denies cough, dyspnea or wheezes Cardiovascular: Denies palpitation, chest discomfort Gastrointestinal:  Denies nausea, heartburn or change in bowel habits Skin: Denies abnormal skin rashes Lymphatics: Denies new lymphadenopathy or easy bruising Neurological:Denies numbness, tingling or new weaknesses Behavioral/Psych: Mood is stable, no new changes  Extremities: Some generalized lower extremity weakness and tingling around the back Breast:  denies any pain or lumps or nodules in either breasts All other systems were reviewed with the patient and are negative.  I have reviewed the past medical history, past surgical history, social history and family history with the patient and they are unchanged from previous note.  ALLERGIES:  is allergic to lisinopril and tamoxifen.  MEDICATIONS:  Current Outpatient Prescriptions  Medication Sig Dispense Refill  . atenolol (TENORMIN) 50 MG tablet Take 50 mg by mouth daily.    . blood glucose meter kit and supplies KIT Dispense based on patient and insurance preference. Use up to four times daily as directed. (FOR ICD-9 250.00, 250.01). 1 each 0  . Cholecalciferol (VITAMIN D-3 PO) Take 1 capsule by mouth daily.    Marland Kitchen dexamethasone (DECADRON) 4 MG tablet Take 1 tablet (4 mg total) by mouth 3 (three) times daily. Taper to 1 tablet 2 times daily on Sat, 01-14-16. 90 tablet 0  . diazepam (VALIUM) 5 MG tablet Take 1 tablet (5 mg total) by mouth 2 (two) times daily. (Patient taking differently: Take 5 mg by mouth 2 (two) times daily as needed for anxiety. ) 10 tablet 0  . ENSURE (ENSURE) Take 237 mLs by mouth 2 (two) times daily between meals.     . fluconazole (DIFLUCAN) 100 MG tablet Take 2 tablets  today, then 1 tablet daily x 20 more days. 22 tablet 0  . Iron-Vitamins (GERITOL) LIQD Take 5 mLs by mouth daily.    Marland Kitchen letrozole (FEMARA) 2.5 MG tablet Take 1 tablet (2.5 mg total) by mouth daily. 90 tablet 3  . Magnesium Salicylate (DOANS PILLS PO) Take 2 tablets by mouth daily as needed (for pain).    . metFORMIN (GLUCOPHAGE) 500 MG tablet Take 1 tablet (500 mg total) by mouth 2 (two) times daily with a meal. 60 tablet 0  . methocarbamol (ROBAXIN) 500 MG tablet Take 1 tablet (500 mg total) by mouth 2 (two) times daily as needed for muscle spasms. (Patient taking differently: Take 1,000 mg by mouth 4 (four) times daily as needed for muscle spasms. ) 20 tablet 0  . omeprazole (PRILOSEC) 20 MG capsule Take 1 capsule (20 mg total) by mouth daily. Take while using  dexamethasone to prevent heartburn. 30 capsule 2  . oxyCODONE-acetaminophen (PERCOCET/ROXICET) 5-325 MG tablet Take 1 tablet by mouth every 6 (six) hours as needed for severe pain. 120 tablet 0  . polyethylene glycol (MIRALAX / GLYCOLAX) packet Take 17 g by mouth daily. 14 each 0  . Ribociclib Succinate 200 MG TABS Take 600 mg by mouth daily. 3 weeks on 1 week off 63 tablet 6  . traMADol (ULTRAM) 50 MG tablet Take 50-100 mg by mouth every 6 (six) hours as needed (for pain).     No current facility-administered medications for this visit.    Facility-Administered Medications Ordered in Other Visits  Medication Dose Route Frequency Provider Last Rate Last Dose  . goserelin (ZOLADEX) injection 3.6 mg  3.6 mg Subcutaneous Q28 days [name], MD   3.6 mg at 01/27/16 1009    PHYSICAL EXAMINATION: ECOG PERFORMANCE STATUS: 1 - Symptomatic but completely ambulatory  Vitals:   02/13/16 1058  BP: (!) 153/95  Pulse: (!) 106  Resp: 18  Temp: 98.4 F (36.9 C)   Filed Weights   02/13/16 1058  Weight: 174 lb 12.8 oz (79.3 kg)    GENERAL:alert, no distress and comfortable SKIN: skin color, texture, turgor are normal, no rashes or  significant lesions EYES: normal, Conjunctiva are pink and non-injected, sclera clear OROPHARYNX:no exudate, no erythema and lips, buccal mucosa, and tongue normal  NECK: supple, thyroid normal size, non-tender, without nodularity LYMPH:  no palpable lymphadenopathy in the cervical, axillary or inguinal LUNGS: clear to auscultation and percussion with normal breathing effort HEART: regular rate & rhythm and no murmurs and no lower extremity edema ABDOMEN:abdomen soft, non-tender and normal bowel sounds MUSCULOSKELETAL:no cyanosis of digits and no clubbing  NEURO: alert & oriented x 3 with fluent speech, no focal motor/sensory deficits EXTREMITIES: No lower extremity edema  LABORATORY DATA:  I have reviewed the data as listed   Chemistry      Component Value Date/Time   NA 135 (L) 01/27/2016 0924   K 3.8 01/27/2016 0924   CL 98 (L) 01/04/2016 0504   CO2 26 01/27/2016 0924   BUN 12.8 01/27/2016 0924   CREATININE 0.7 01/27/2016 0924      Component Value Date/Time   CALCIUM 9.5 01/27/2016 0924   ALKPHOS 245 (H) 01/27/2016 0924   AST 23 01/27/2016 0924   ALT 18 01/27/2016 0924   BILITOT 0.36 01/27/2016 0924       Lab Results  Component Value Date   WBC 3.3 (L) 02/13/2016   HGB 9.3 (L) 02/13/2016   HCT 29.1 (L) 02/13/2016   MCV 85.1 02/13/2016   PLT 176 02/13/2016   NEUTROABS 2.8 02/13/2016     ASSESSMENT & PLAN:  Breast cancer of upper-inner quadrant of right female breast ([place]) 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   Treatment summary:  1. Patient completed neoadjuvant dose dense Adriamycin and Cytoxan and weekly Taxol X 12 started 02/09/2014 and completed 07/01/14 2. Right double lumpectomy 2:00: IDC grade 3, 3.2 cm, high-grade DCIS, LV I present, PNI present,; 12:00: IDC grade 3; 0.8 cm, high-grade DCIS, LVI, 4/4 lymph nodes  positive with ECE, ER 90%, PR 40%, HER-2 negative margin positive T2 N2 M0 stage IIIa 3. Patient is on [facility] clinical trial and she was randomized intraoperatively for NO lymph node dissection. 4. Tamoxifen 20 mg daily started 12/29/2014 stopped July 2016 for adverse effects  5. Relapsed/metastatic disease diagnosed 12/31/2015 when she presented with cord compression with innumerable bone metastases with L4 pathological fracture 7. Palliative XRT to the spine for cord compression --------------------------------------------------------------------------------------------------------------------------------------------------------------- Bone Mets: Xgeva with calcium and vitamin D  Current treatment: Ribociclib with letrozole plus ovarian suppression  Ribociclib toxicities:  1. Fatigue Monitoring EKG and blood work very closely.   EKG done today Goals of care were discussed: Palliation Return to clinic in one month for toxicity check and repeat EKG for QT prolongation review     Orders Placed This Encounter  Procedures  . CBC with Differential    Standing Status:   Future    Standing Expiration Date:   02/12/2017  . Comprehensive metabolic panel    Standing Status:   Future    Standing Expiration Date:   02/12/2017   The patient has a good understanding of the overall plan. she agrees with it. she will call with any problems that may develop before the next visit here.   Rulon Eisenmenger, MD 02/13/16

## 2016-02-13 NOTE — Progress Notes (Signed)
Contacted [facility] for Debbie Monaco, NP.  Pt has not been seen there in a long time - requested new referral.  Referral sent with face sheet, insurance card, last [facility] note.

## 2016-02-13 NOTE — Addendum Note (Signed)
Addended by: Prentiss Bells on: 02/13/2016 01:56 PM   Modules accepted: Orders

## 2016-02-14 ENCOUNTER — Other Ambulatory Visit: Payer: Self-pay | Admitting: Hematology and Oncology

## 2016-02-14 MED FILL — KISQALI 200 MG DAILY DOSE: 200 | 28 days supply | Qty: 63 | Fill #1

## 2016-02-16 ENCOUNTER — Encounter: Payer: Self-pay | Admitting: Radiation Oncology

## 2016-02-16 ENCOUNTER — Other Ambulatory Visit: Payer: Self-pay

## 2016-02-16 ENCOUNTER — Telehealth: Payer: Self-pay | Admitting: Hematology and Oncology

## 2016-02-16 MED ORDER — ACCU-CHEK SOFTCLIX LANCETS MISC: 100.0000 | Freq: Four times a day (QID) | 0 refills | Status: DC

## 2016-02-16 NOTE — Telephone Encounter (Signed)
Injections, labs, EKG and follow up appointments scheduled. All appointments was confirmed with patient. AVS report and appointment schedule given to patient, per 02/16/16 los.

## 2016-02-24 ENCOUNTER — Ambulatory Visit (HOSPITAL_BASED_OUTPATIENT_CLINIC_OR_DEPARTMENT_OTHER): Payer: Medicaid Other

## 2016-02-24 ENCOUNTER — Encounter: Payer: Self-pay | Admitting: *Deleted

## 2016-02-24 ENCOUNTER — Ambulatory Visit: Admission: RE | Admit: 2016-02-24 | Payer: Medicaid Other | Source: Ambulatory Visit | Admitting: Radiation Oncology

## 2016-02-24 VITALS — BP 142/85 | HR 109 | Temp 98.5°F | Resp 21

## 2016-02-24 DIAGNOSIS — C7951 Secondary malignant neoplasm of bone: Secondary | ICD-10-CM

## 2016-02-24 DIAGNOSIS — C50911 Malignant neoplasm of unspecified site of right female breast: Secondary | ICD-10-CM

## 2016-02-24 DIAGNOSIS — C50211 Malignant neoplasm of upper-inner quadrant of right female breast: Secondary | ICD-10-CM

## 2016-02-24 DIAGNOSIS — Z5111 Encounter for antineoplastic chemotherapy: Secondary | ICD-10-CM

## 2016-02-24 HISTORY — DX: Personal history of irradiation: Z92.3

## 2016-02-24 MED ORDER — GOSERELIN ACETATE 3.6 MG ~~LOC~~ IMPL
3.6000 mg | DRUG_IMPLANT | SUBCUTANEOUS | Status: DC
  Administered 2016-02-24: 3.6 mg via SUBCUTANEOUS
  Filled 2016-02-24: qty 3.6

## 2016-02-24 MED ORDER — DENOSUMAB 120 MG/1.7ML ~~LOC~~ SOLN
120.0000 mg | Freq: Once | SUBCUTANEOUS | Status: AC
  Administered 2016-02-24: 120 mg via SUBCUTANEOUS
  Filled 2016-02-24: qty 1.7

## 2016-02-27 ENCOUNTER — Telehealth: Payer: Self-pay | Admitting: *Deleted

## 2016-02-27 NOTE — Telephone Encounter (Signed)
CALLED PATIENT TO RESCHEDULE HER MISSED FU ON 02-24-16, LVM FOR A RETURN CALL

## 2016-03-02 ENCOUNTER — Other Ambulatory Visit: Payer: Self-pay

## 2016-03-02 ENCOUNTER — Telehealth: Payer: Self-pay | Admitting: *Deleted

## 2016-03-02 MED ORDER — METFORMIN HCL 500 MG PO TABS: 500.0000 mg | ORAL_TABLET | Freq: Two times a day (BID) | ORAL | 0 refills | Status: DC

## 2016-03-02 MED FILL — LETROZOLE 2.5 MG TABLET: 2.5 | 30 days supply | Qty: 30 | Fill #1

## 2016-03-02 MED FILL — metFORMIN HCL 500 MG TABS: 500 | 30 days supply | Qty: 60 | Fill #0

## 2016-03-02 NOTE — Telephone Encounter (Signed)
Called pt to notify her that her metformin has been refilled to [facility]. Asked if pt has a pcp that can manage her future refill for this medication. Pt states that she will be seeing a pcp in December. No further questions at this time.

## 2016-03-02 NOTE — Telephone Encounter (Signed)
Call received @ 835  Pt called stating that she needed a refill on her METFORMIN 500mg   Call was transferred to vmail/message left

## 2016-03-14 ENCOUNTER — Ambulatory Visit
Admission: RE | Admit: 2016-03-14 | Discharge: 2016-03-14 | Disposition: A | Discharge status: Home / Self Care | Payer: Medicaid Other | Source: Ambulatory Visit | Attending: Radiation Oncology | Admitting: Radiation Oncology

## 2016-03-14 ENCOUNTER — Encounter: Payer: Self-pay | Admitting: Radiation Oncology

## 2016-03-14 VITALS — BP 160/97 | HR 86 | Temp 98.2°F | Ht 67.0 in | Wt 173.0 lb

## 2016-03-14 DIAGNOSIS — Z923 Personal history of irradiation: Secondary | ICD-10-CM | POA: Insufficient documentation

## 2016-03-14 DIAGNOSIS — C7951 Secondary malignant neoplasm of bone: Secondary | ICD-10-CM | POA: Diagnosis not present

## 2016-03-14 DIAGNOSIS — M25561 Pain in right knee: Secondary | ICD-10-CM | POA: Diagnosis not present

## 2016-03-14 DIAGNOSIS — M25562 Pain in left knee: Secondary | ICD-10-CM | POA: Diagnosis not present

## 2016-03-14 DIAGNOSIS — L810 Postinflammatory hyperpigmentation: Secondary | ICD-10-CM | POA: Insufficient documentation

## 2016-03-14 DIAGNOSIS — Z888 Allergy status to other drugs, medicaments and biological substances status: Secondary | ICD-10-CM | POA: Insufficient documentation

## 2016-03-14 DIAGNOSIS — Z79811 Long term (current) use of aromatase inhibitors: Secondary | ICD-10-CM | POA: Insufficient documentation

## 2016-03-14 DIAGNOSIS — R030 Elevated blood-pressure reading, without diagnosis of hypertension: Secondary | ICD-10-CM | POA: Diagnosis not present

## 2016-03-14 NOTE — Progress Notes (Signed)
Radiation Oncology         [phone] ________________________________  Name: Debbie Martinez MRN: [id number]  Date: 03/14/2016  DOB: 20-Oct-1975  Follow-Up Visit Note  Outpatient  CC: PROVIDER NOT IN SYSTEM  Heath Lark, MD  Diagnosis and Prior Radiotherapy:    ICD-9-CM ICD-10-CM   1. Bone metastasis ([facility]) 198.5 C79.51      01/04/2016-01/23/2016: L2-S2 spine / 35 Gy in 14 fractions  11/03/2014-12/14/2014: 1) Right Breast, supraclavicular nodes, axillary nodes, and internal mammary nodes / 50 Gy in 25 fractions 2) Right Breast Boost / 10 Gy in 5 fractions  CHIEF COMPLAINT: Here for follow-up and surveillance of stage IV breast cancer with osseous metastasis  Narrative:  The patient returns today for routine follow-up. The patient followed up with Dr. Lindi Adie on 02/13/16. The patient is on Ribociclib 3 weeks on and 1 week off, Letrozole (with Zoladex until oophorectomy), and Xgeva for bone metastases since 01/13/16.  She denies pain at this time, but reports that she will take a percocet occasionally for pain relief for her back and knees. She does continue to report fatigue. She is using a walker today, but only if she is walking longer distances. She wears a back brace when ambulating. She is eating well and also drinks about one ensure daily. The skin to her radiation site is hyperpigmented per her report, she continues to use sonafine cream, and will change to vitamin E when completed. Her current treatment with medical oncology is Ribociclib with Letrozole plus ovarian suppression. No new neurologic complaints.  ALLERGIES:  is allergic to lisinopril and tamoxifen.  Meds: Current Outpatient Prescriptions  Medication Sig Dispense Refill  . ACCU-CHEK AVIVA PLUS test strip USE AS DIRECTED 4 TIMES A DAY 100 each 0  . ACCU-CHEK SOFTCLIX LANCETS lancets 100 each by Other route 4 (four) times daily. Use as instructed 100 each 0  . blood glucose meter kit and supplies KIT Dispense  based on patient and insurance preference. Use up to four times daily as directed. (FOR ICD-9 250.00, 250.01). 1 each 0  . Cholecalciferol (VITAMIN D-3 PO) Take 1 capsule by mouth daily.    Marland Kitchen dexamethasone (DECADRON) 4 MG tablet Take 1 tablet (4 mg total) by mouth 3 (three) times daily. Taper to 1 tablet 2 times daily on Sat, 01-14-16. 90 tablet 0  . ENSURE (ENSURE) Take 237 mLs by mouth 2 (two) times daily between meals.     . Iron-Vitamins (GERITOL) LIQD Take 5 mLs by mouth daily.    Marland Kitchen letrozole (FEMARA) 2.5 MG tablet Take 1 tablet (2.5 mg total) by mouth daily. 90 tablet 3  . Magnesium Salicylate (DOANS PILLS PO) Take 2 tablets by mouth daily as needed (for pain).    . metFORMIN (GLUCOPHAGE) 500 MG tablet Take 1 tablet (500 mg total) by mouth 2 (two) times daily with a meal. 60 tablet 0  . methocarbamol (ROBAXIN) 500 MG tablet Take 1 tablet (500 mg total) by mouth 2 (two) times daily as needed for muscle spasms. (Patient taking differently: Take 1,000 mg by mouth 4 (four) times daily as needed for muscle spasms. ) 20 tablet 0  . omeprazole (PRILOSEC) 20 MG capsule Take 1 capsule (20 mg total) by mouth daily. Take while using dexamethasone to prevent heartburn. 30 capsule 2  . oxyCODONE-acetaminophen (PERCOCET/ROXICET) 5-325 MG tablet Take 1 tablet by mouth every 6 (six) hours as needed for severe pain. 120 tablet 0  . polyethylene glycol (MIRALAX / GLYCOLAX) packet Take 17  g by mouth daily. 14 each 0  . Ribociclib Succinate 200 MG TABS Take 600 mg by mouth daily. 3 weeks on 1 week off 63 tablet 6  . atenolol (TENORMIN) 50 MG tablet Take 50 mg by mouth daily.    . diazepam (VALIUM) 5 MG tablet Take 1 tablet (5 mg total) by mouth 2 (two) times daily. (Patient not taking: Reported on 03/14/2016) 10 tablet 0  . traMADol (ULTRAM) 50 MG tablet Take 50-100 mg by mouth every 6 (six) hours as needed (for pain).     No current facility-administered medications for this encounter.    Facility-Administered  Medications Ordered in Other Encounters  Medication Dose Route Frequency Provider Last Rate Last Dose  . goserelin (ZOLADEX) injection 3.6 mg  3.6 mg Subcutaneous Q28 days [name], MD   3.6 mg at 01/27/16 1009    Physical Findings: The patient is in no acute distress. Patient is alert and oriented.  height is '5\' 7"'  (1.702 m) and weight is 173 lb (78.5 kg). Her temperature is 98.2 F (36.8 C). Her blood pressure is 160/97 (abnormal) and her pulse is 86. Her oxygen saturation is 100%. .    The patient presents with a back brace and uses a walker to ambulate. Hyperpigmentation over the lumbosacral region of her back. The skin is intact.  Lab Findings: Lab Results  Component Value Date   WBC 3.3 (L) 02/13/2016   HGB 9.3 (L) 02/13/2016   HCT 29.1 (L) 02/13/2016   MCV 85.1 02/13/2016   PLT 176 02/13/2016    Radiographic Findings: No results found.  Impression/Plan: She had an excellent response to palliative spine radiotherapy. She will continue to follow up with medical oncology. I advised her to apply vitamin E lotion over her back. I will see her back on an as needed basis.  The patient has joint pain especially in her knees, but it is not severe. The patient is on Letrozole and she should let Dr. Lindi Adie know of this since that may be a side effect. The patient is scheduled to follow up with Dr. Lindi Adie on 03/23/16.   [name], MD  This document serves as a record of services personally performed by [name], MD. It was created on her behalf by Darcus Austin, a trained medical scribe. The creation of this record is based on the scribe's personal observations and the provider's statements to them. This document has been checked and approved by the attending provider.

## 2016-03-14 NOTE — Progress Notes (Signed)
Ms. Cavendish presents for follow up of radiation completed 01/23/16 to her L2- S2. She denies pain at this time. She reports to me that she will take a percocet occasionally for pain relief. She does continue to report fatigue. She is using a walker today, but only if she is walking longer distances. She wears a back brace when ambulating. She is eating well, and also drinks about one ensure daily. The skin to her radiation site is hyperpigmented per her report and she continues to use sonafine cream, and will change to vitamin E when completed. Her current treatment with medical oncology is Ribociclib with Letrozole plus ovarian suppression.   BP (!) 160/97   Pulse 86   Temp 98.2 F (36.8 C)   Ht 5\' 7"  (1.702 m)   Wt 173 lb (78.5 kg)   SpO2 100% Comment: room air  BMI 27.10 kg/m    Wt Readings from Last 3 Encounters:  03/14/16 173 lb (78.5 kg)  02/13/16 174 lb 12.8 oz (79.3 kg)  01/23/16 173 lb (78.5 kg)

## 2016-03-21 MED FILL — KISQALI 200 MG DAILY DOSE: 200 | 28 days supply | Qty: 63 | Fill #2

## 2016-03-22 NOTE — Assessment & Plan Note (Signed)
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   Treatment summary:  1. Patient completed neoadjuvant dose dense Adriamycin and Cytoxan and weekly Taxol X 12 started 02/09/2014 and completed 07/01/14 2. Right double lumpectomy 2:00: IDC grade 3, 3.2 cm, high-grade DCIS, LV I present, PNI present,; 12:00: IDC grade 3; 0.8 cm, high-grade DCIS, LVI, 4/4 lymph nodes positive with ECE, ER 90%, PR 40%, HER-2 negative margin positive T2 N2 M0 stage IIIa 3. Patient is on [facility] clinical trial and she was randomized intraoperatively for NO lymph node dissection. 4.Tamoxifen 20 mg daily started 12/29/2014 stopped July 2016 for adverse effects 5. 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 Palliative XRT to the spine for cord compression --------------------------------------------------------------------------------------------------------------------------------------------------------------- Bone Mets: Xgeva with calcium and vitamin D  Current treatment: Ribociclibwith letrozole plus ovarian suppression  Ribociclib toxicities:  1. Fatigue Monitoring EKG and blood work very closely.   EKG done today Goals of care were discussed: Palliation Return to clinic in one month for toxicity check and repeat EKG for QT prolongation review

## 2016-03-23 ENCOUNTER — Other Ambulatory Visit: Payer: Medicaid Other

## 2016-03-23 ENCOUNTER — Ambulatory Visit (HOSPITAL_BASED_OUTPATIENT_CLINIC_OR_DEPARTMENT_OTHER): Payer: Medicaid Other

## 2016-03-23 ENCOUNTER — Ambulatory Visit: Payer: Medicaid Other | Admitting: Hematology and Oncology

## 2016-03-23 ENCOUNTER — Other Ambulatory Visit (HOSPITAL_BASED_OUTPATIENT_CLINIC_OR_DEPARTMENT_OTHER): Payer: Medicaid Other

## 2016-03-23 ENCOUNTER — Encounter: Payer: Self-pay | Admitting: Hematology and Oncology

## 2016-03-23 ENCOUNTER — Ambulatory Visit (HOSPITAL_BASED_OUTPATIENT_CLINIC_OR_DEPARTMENT_OTHER): Payer: Medicaid Other | Admitting: Hematology and Oncology

## 2016-03-23 DIAGNOSIS — C50211 Malignant neoplasm of upper-inner quadrant of right female breast: Secondary | ICD-10-CM

## 2016-03-23 DIAGNOSIS — C701 Malignant neoplasm of spinal meninges: Secondary | ICD-10-CM | POA: Diagnosis not present

## 2016-03-23 DIAGNOSIS — Z79811 Long term (current) use of aromatase inhibitors: Secondary | ICD-10-CM | POA: Diagnosis not present

## 2016-03-23 DIAGNOSIS — C773 Secondary and unspecified malignant neoplasm of axilla and upper limb lymph nodes: Secondary | ICD-10-CM | POA: Diagnosis not present

## 2016-03-23 DIAGNOSIS — Z5111 Encounter for antineoplastic chemotherapy: Secondary | ICD-10-CM

## 2016-03-23 DIAGNOSIS — C7951 Secondary malignant neoplasm of bone: Secondary | ICD-10-CM

## 2016-03-23 DIAGNOSIS — C50911 Malignant neoplasm of unspecified site of right female breast: Secondary | ICD-10-CM

## 2016-03-23 DIAGNOSIS — Z170 Estrogen receptor positive status [ER+]: Secondary | ICD-10-CM | POA: Diagnosis not present

## 2016-03-23 DIAGNOSIS — R5383 Other fatigue: Secondary | ICD-10-CM

## 2016-03-23 DIAGNOSIS — G9520 Unspecified cord compression: Secondary | ICD-10-CM

## 2016-03-23 LAB — CBC WITH DIFFERENTIAL/PLATELET
BASO%: 0.6 % (ref 0.0–2.0)
Basophils Absolute: 0 10*3/uL (ref 0.0–0.1)
EOS%: 0.4 % (ref 0.0–7.0)
Eosinophils Absolute: 0 10*3/uL (ref 0.0–0.5)
HEMATOCRIT: 31.4 % — AB (ref 34.8–46.6)
HEMOGLOBIN: 10.2 g/dL — AB (ref 11.6–15.9)
LYMPH#: 0.1 10*3/uL — AB (ref 0.9–3.3)
LYMPH%: 9.4 % — ABNORMAL LOW (ref 14.0–49.7)
MCH: 30.7 pg (ref 25.1–34.0)
MCHC: 32.3 g/dL (ref 31.5–36.0)
MCV: 94.9 fL (ref 79.5–101.0)
MONO#: 0.2 10*3/uL (ref 0.1–0.9)
MONO%: 12.5 % (ref 0.0–14.0)
NEUT#: 1.2 10*3/uL — ABNORMAL LOW (ref 1.5–6.5)
NEUT%: 77.1 % — AB (ref 38.4–76.8)
Platelets: 169 10*3/uL (ref 145–400)
RBC: 3.31 10*6/uL — ABNORMAL LOW (ref 3.70–5.45)
RDW: 22.8 % — AB (ref 11.2–14.5)
WBC: 1.5 10*3/uL — ABNORMAL LOW (ref 3.9–10.3)

## 2016-03-23 LAB — COMPREHENSIVE METABOLIC PANEL
ALT: 26 U/L (ref 0–55)
AST: 12 U/L (ref 5–34)
Albumin: 3.2 g/dL — ABNORMAL LOW (ref 3.5–5.0)
Alkaline Phosphatase: 104 U/L (ref 40–150)
Anion Gap: 11 mEq/L (ref 3–11)
BUN: 4.7 mg/dL — ABNORMAL LOW (ref 7.0–26.0)
CALCIUM: 8.5 mg/dL (ref 8.4–10.4)
CHLORIDE: 102 meq/L (ref 98–109)
CO2: 23 mEq/L (ref 22–29)
CREATININE: 0.7 mg/dL (ref 0.6–1.1)
EGFR: >90 mL/min/{1.73_m2} (ref >90)
GLUCOSE: 312 mg/dL — AB (ref 70–140)
Potassium: 4.2 mEq/L (ref 3.5–5.1)
SODIUM: 137 meq/L (ref 136–145)
Total Bilirubin: 0.33 mg/dL (ref 0.20–1.20)
Total Protein: 6.7 g/dL (ref 6.4–8.3)

## 2016-03-23 MED ORDER — GOSERELIN ACETATE 3.6 MG ~~LOC~~ IMPL
3.6000 mg | DRUG_IMPLANT | SUBCUTANEOUS | Status: AC
  Administered 2016-03-23: 3.6 mg via SUBCUTANEOUS
  Filled 2016-03-23: qty 3.6

## 2016-03-23 MED ORDER — DENOSUMAB 120 MG/1.7ML ~~LOC~~ SOLN
120.0000 mg | Freq: Once | SUBCUTANEOUS | Status: AC
  Administered 2016-03-23: 120 mg via SUBCUTANEOUS
  Filled 2016-03-23: qty 1.7

## 2016-03-23 NOTE — Patient Instructions (Signed)
Goserelin injection What is this medicine? GOSERELIN (GOE se rel in) is similar to a hormone found in the body. It lowers the amount of sex hormones that the body makes. Men will have lower testosterone levels and women will have lower estrogen levels while taking this medicine. In men, this medicine is used to treat prostate cancer; the injection is either given once per month or once every 12 weeks. A once per month injection (only) is used to treat women with endometriosis, dysfunctional uterine bleeding, or advanced breast cancer. This medicine may be used for other purposes; ask your health care provider or pharmacist if you have questions. What should I tell my health care provider before I take this medicine? They need to know if you have any of these conditions (some only apply to women): -diabetes -heart disease or previous heart attack -high blood pressure -high cholesterol -kidney disease -osteoporosis or low bone density -problems passing urine -spinal cord injury -stroke -tobacco smoker -an unusual or allergic reaction to goserelin, hormone therapy, other medicines, foods, dyes, or preservatives -pregnant or trying to get pregnant -breast-feeding How should I use this medicine? This medicine is for injection under the skin. It is given by a health care professional in a hospital or clinic setting. Men receive this injection once every 4 weeks or once every 12 weeks. Women will only receive the once every 4 weeks injection. Talk to your pediatrician regarding the use of this medicine in children. Special care may be needed. Overdosage: If you think you have taken too much of this medicine contact a poison control center or emergency room at once. NOTE: This medicine is only for you. Do not share this medicine with others. What if I miss a dose? It is important not to miss your dose. Call your doctor or health care professional if you are unable to keep an appointment. What may  interact with this medicine? -female hormones like estrogen -herbal or dietary supplements like black cohosh, chasteberry, or DHEA -female hormones like testosterone -prasterone This list may not describe all possible interactions. Give your health care provider a list of all the medicines, herbs, non-prescription drugs, or dietary supplements you use. Also tell them if you smoke, drink alcohol, or use illegal drugs. Some items may interact with your medicine. What should I watch for while using this medicine? Visit your doctor or health care professional for regular checks on your progress. Your symptoms may appear to get worse during the first weeks of this therapy. Tell your doctor or healthcare professional if your symptoms do not start to get better or if they get worse after this time. Your bones may get weaker if you take this medicine for a long time. If you smoke or frequently drink alcohol you may increase your risk of bone loss. A family history of osteoporosis, chronic use of drugs for seizures (convulsions), or corticosteroids can also increase your risk of bone loss. Talk to your doctor about how to keep your bones strong. This medicine should stop regular monthly menstration in women. Tell your doctor if you continue to menstrate. Women should not become pregnant while taking this medicine or for 12 weeks after stopping this medicine. Women should inform their doctor if they wish to become pregnant or think they might be pregnant. There is a potential for serious side effects to an unborn child. Talk to your health care professional or pharmacist for more information. Do not breast-feed an infant while taking this medicine. Men should   inform their doctors if they wish to father a child. This medicine may lower sperm counts. Talk to your health care professional or pharmacist for more information. What side effects may I notice from receiving this medicine? Side effects that you should  report to your doctor or health care professional as soon as possible: -allergic reactions like skin rash, itching or hives, swelling of the face, lips, or tongue -bone pain -breathing problems -changes in vision -chest pain -feeling faint or lightheaded, falls -fever, chills -pain, swelling, warmth in the leg -pain, tingling, numbness in the hands or feet -signs and symptoms of low blood pressure like dizziness; feeling faint or lightheaded, falls; unusually weak or tired -stomach pain -swelling of the ankles, feet, hands -trouble passing urine or change in the amount of urine -unusually high or low blood pressure -unusually weak or tired Side effects that usually do not require medical attention (report to your doctor or health care professional if they continue or are bothersome): -change in sex drive or performance -changes in breast size in both males and females -changes in emotions or moods -headache -hot flashes -irritation at site where injected -loss of appetite -skin problems like acne, dry skin -vaginal dryness This list may not describe all possible side effects. Call your doctor for medical advice about side effects. You may report side effects to FDA at 1-800-FDA-1088. Where should I keep my medicine? This drug is given in a hospital or clinic and will not be stored at home. NOTE: This sheet is a summary. It may not cover all possible information. If you have questions about this medicine, talk to your doctor, pharmacist, or health care provider.    2016, [employer] Standard. (2013-06-16 11:10:35) Denosumab injection What is this medicine? DENOSUMAB (den oh sue mab) slows bone breakdown. Prolia is used to treat osteoporosis in women after menopause and in men. Delton See is used to prevent bone fractures and other bone problems caused by cancer bone metastases. Delton See is also used to treat giant cell tumor of the bone. This medicine may be used for other purposes; ask  your health care provider or pharmacist if you have questions. What should I tell my health care provider before I take this medicine? They need to know if you have any of these conditions: -dental disease -eczema -infection or history of infections -kidney disease or on dialysis -low blood calcium or vitamin D -malabsorption syndrome -scheduled to have surgery or tooth extraction -taking medicine that contains denosumab -thyroid or parathyroid disease -an unusual reaction to denosumab, other medicines, foods, dyes, or preservatives -pregnant or trying to get pregnant -breast-feeding How should I use this medicine? This medicine is for injection under the skin. It is given by a health care professional in a hospital or clinic setting. If you are getting Prolia, a special MedGuide will be given to you by the pharmacist with each prescription and refill. Be sure to read this information carefully each time. For Prolia, talk to your pediatrician regarding the use of this medicine in children. Special care may be needed. For Delton See, talk to your pediatrician regarding the use of this medicine in children. While this drug may be prescribed for children as young as 13 years for selected conditions, precautions do apply. Overdosage: If you think you have taken too much of this medicine contact a poison control center or emergency room at once. NOTE: This medicine is only for you. Do not share this medicine with others. What if I miss a dose?  It is important not to miss your dose. Call your doctor or health care professional if you are unable to keep an appointment. What may interact with this medicine? Do not take this medicine with any of the following medications: -other medicines containing denosumab This medicine may also interact with the following medications: -medicines that suppress the immune system -medicines that treat cancer -steroid medicines like prednisone or cortisone This list  may not describe all possible interactions. Give your health care provider a list of all the medicines, herbs, non-prescription drugs, or dietary supplements you use. Also tell them if you smoke, drink alcohol, or use illegal drugs. Some items may interact with your medicine. What should I watch for while using this medicine? Visit your doctor or health care professional for regular checks on your progress. Your doctor or health care professional may order blood tests and other tests to see how you are doing. Call your doctor or health care professional if you get a cold or other infection while receiving this medicine. Do not treat yourself. This medicine may decrease your body's ability to fight infection. You should make sure you get enough calcium and vitamin D while you are taking this medicine, unless your doctor tells you not to. Discuss the foods you eat and the vitamins you take with your health care professional. See your dentist regularly. Brush and floss your teeth as directed. Before you have any dental work done, tell your dentist you are receiving this medicine. Do not become pregnant while taking this medicine or for 5 months after stopping it. Women should inform their doctor if they wish to become pregnant or think they might be pregnant. There is a potential for serious side effects to an unborn child. Talk to your health care professional or pharmacist for more information. What side effects may I notice from receiving this medicine? Side effects that you should report to your doctor or health care professional as soon as possible: -allergic reactions like skin rash, itching or hives, swelling of the face, lips, or tongue -breathing problems -chest pain -fast, irregular heartbeat -feeling faint or lightheaded, falls -fever, chills, or any other sign of infection -muscle spasms, tightening, or twitches -numbness or tingling -skin blisters or bumps, or is dry, peels, or red -slow  healing or unexplained pain in the mouth or jaw -unusual bleeding or bruising Side effects that usually do not require medical attention (Report these to your doctor or health care professional if they continue or are bothersome.): -muscle pain -stomach upset, gas This list may not describe all possible side effects. Call your doctor for medical advice about side effects. You may report side effects to FDA at 1-800-FDA-1088. Where should I keep my medicine? This medicine is only given in a clinic, doctor's office, or other health care setting and will not be stored at home. NOTE: This sheet is a summary. It may not cover all possible information. If you have questions about this medicine, talk to your doctor, pharmacist, or health care provider.    2016, [employer] Standard. (2011-10-08 12:37:47)

## 2016-03-23 NOTE — Progress Notes (Signed)
Patient Care Team: Provider Not In System as PCP - El Dara, MD as Consulting Physician (General Surgery) [name], MD as Consulting Physician (Hematology and Oncology) [name], MD as Attending Physician (Radiation Oncology) Sylvan Cheese, NP as Nurse Practitioner (Hematology and Oncology)  DIAGNOSIS:  Encounter Diagnoses  Name Primary?  . Bone metastasis ([place]) Yes  . Cord compression ([place])   . Malignant neoplasm of upper-inner quadrant of right breast in female, estrogen receptor positive ([place])     SUMMARY OF ONCOLOGIC HISTORY:   Breast cancer of upper-inner quadrant of right female breast ([place])   01/12/2014 Mammogram    Right breast - two lesions: #1 2:00 position 1.6 x 1.9 cm and #2 12:00 position 0.5 x 0.9 cm. Distance between the 2 was 4.5 cm, right axillary lymph node enlargement      01/12/2014 Initial Biopsy    Right breast 3 biopsies: All were IDC with DCIS ER+ PR + Ki-67 85% HER-2 negative: Right axillary lymph node also positive for metastatic cancer ER positive HER-2 negative Ki-67 80% grade 2      01/18/2014 Breast MRI    Right breast 12:00 position 2.8 x 2 x 2.2 cm: 2:00 position 1.3 x 0.8 x 0.5 cm, right axilla multiple enlarged lymph nodes 1 cm in size, right retropectoral lymph node 0.6 cm      01/18/2014 Clinical Stage    Stage IIB T2 N1      01/25/2014 Procedure    Breast/Ovarian ([facility]) reveals no clinically significant variant at ATM, BARD1, BRCA1, BRCA2, BRIP1, CDH1, CHEK2, EPCAM, FANCC, MLH1, MSH2, MSH6, NBN, PALB2, PMS2, PTEN, RAD51C, RAD51D, TP53, and XRCC2.       02/11/2014 -  Neo-Adjuvant Chemotherapy    Doxorubicin and cyclophosphamide X 4 followed by Taxol weekly x12       07/12/2014 Breast MRI    Partial response to neoadjuvant chemotherapy right breast mass decreased from 2.8 cm to 2.3 cm, 2 small masses along the medial margin also decreased; significant decrease right breast mass 11 to 12:00 and 13 mm to 8 mm,  decrease in right axilla LN      08/23/2014 Definitive Surgery    Right lumpectomy/[facility]NB [name]) 2:00: IDC grade 3, 3.2 cm, high-grade DCIS, LV I present, PNI present,; 12:00: IDC grade 30.8 cm, high-grade DCIS, LV I, 4/4 lymph nodes positive with ECE, ER 90%, PR 40%, HER-2 negative margin positive      08/23/2014 Pathologic Stage    Stage IIIA: ypT2 ypN2a      09/15/2014 Surgery    Reexcision of margins: clear; no evidence of malignancy      11/03/2014 - 12/14/2014 Radiation Therapy    Adjuvant XRT Isidore Moos): Right Breast, supraclavicular nodes, axillary nodes, and internal mammary nodes: 50 Gy over 25 fractions; right breast boost: 10 Gy over 5 fractions. Total dose: 60 Gy      12/29/2014 -  Anti-estrogen oral therapy    Tamoxifen 20 mg daily      04/08/2015 Survivorship    Survivorship care plan completed and mailed to patient in lieu of in person visit at her request      12/31/2015 Relapse/Recurrence    Innumerable osseous metastases with acute pathologic fracture the L4 vertebral body. 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 L5-S1 protrusion with left more than right S1 impingement      01/05/2016 - 01/12/2016 Radiation Therapy    Palliative radiation to the spine for cord compression  01/13/2016 -  Anti-estrogen oral therapy    Ribociclib 3 weeks on and 1 week off and Letrozole (with Zolodex until oophorectomy) and Xgeva for bone mets       CHIEF COMPLIANT: Follow-up on Ribociclib and letrozole  INTERVAL HISTORY: Debbie Martinez is a 40 year old with above-mentioned history metastatic breast cancer currently on Ribociclib with letrozole started 01/13/2016. She appears to be tolerating it extremely well. She does have fatigue. She denies any nausea vomiting. For the bone metastases receives Xgeva every month. She is feeling fatigued. She denies any fevers or chills.  REVIEW OF SYSTEMS:   Constitutional: Denies fevers, chills or abnormal weight  loss Eyes: Denies blurriness of vision Ears, nose, mouth, throat, and face: Denies mucositis or sore throat Respiratory: Denies cough, dyspnea or wheezes Cardiovascular: Denies palpitation, chest discomfort Gastrointestinal:  Denies nausea, heartburn or change in bowel habits Skin: Denies abnormal skin rashes Lymphatics: Denies new lymphadenopathy or easy bruising Neurological:Denies numbness, tingling or new weaknesses Behavioral/Psych: Mood is stable, no new changes  Extremities: No lower extremity edema All other systems were reviewed with the patient and are negative.  I have reviewed the past medical history, past surgical history, social history and family history with the patient and they are unchanged from previous note.  ALLERGIES:  is allergic to lisinopril and tamoxifen.  MEDICATIONS:  Current Outpatient Prescriptions  Medication Sig Dispense Refill  . ACCU-CHEK AVIVA PLUS test strip USE AS DIRECTED 4 TIMES A DAY 100 each 0  . ACCU-CHEK SOFTCLIX LANCETS lancets 100 each by Other route 4 (four) times daily. Use as instructed 100 each 0  . atenolol (TENORMIN) 50 MG tablet Take 50 mg by mouth daily.    . blood glucose meter kit and supplies KIT Dispense based on patient and insurance preference. Use up to four times daily as directed. (FOR ICD-9 250.00, 250.01). 1 each 0  . Cholecalciferol (VITAMIN D-3 PO) Take 1 capsule by mouth daily.    . diazepam (VALIUM) 5 MG tablet Take 1 tablet (5 mg total) by mouth 2 (two) times daily. (Patient not taking: Reported on 03/14/2016) 10 tablet 0  . ENSURE (ENSURE) Take 237 mLs by mouth 2 (two) times daily between meals.     . Iron-Vitamins (GERITOL) LIQD Take 5 mLs by mouth daily.    Marland Kitchen letrozole (FEMARA) 2.5 MG tablet Take 1 tablet (2.5 mg total) by mouth daily. 90 tablet 3  . Magnesium Salicylate (DOANS PILLS PO) Take 2 tablets by mouth daily as needed (for pain).    . metFORMIN (GLUCOPHAGE) 500 MG tablet Take 1 tablet (500 mg total) by  mouth 2 (two) times daily with a meal. 60 tablet 0  . methocarbamol (ROBAXIN) 500 MG tablet Take 1 tablet (500 mg total) by mouth 2 (two) times daily as needed for muscle spasms. (Patient taking differently: Take 1,000 mg by mouth 4 (four) times daily as needed for muscle spasms. ) 20 tablet 0  . omeprazole (PRILOSEC) 20 MG capsule Take 1 capsule (20 mg total) by mouth daily. Take while using dexamethasone to prevent heartburn. 30 capsule 2  . oxyCODONE-acetaminophen (PERCOCET/ROXICET) 5-325 MG tablet Take 1 tablet by mouth every 6 (six) hours as needed for severe pain. 120 tablet 0  . polyethylene glycol (MIRALAX / GLYCOLAX) packet Take 17 g by mouth daily. 14 each 0  . Ribociclib Succinate 200 MG TABS Take 600 mg by mouth daily. 3 weeks on 1 week off 63 tablet 6  . traMADol (ULTRAM) 50 MG tablet  Take 50-100 mg by mouth every 6 (six) hours as needed (for pain).     No current facility-administered medications for this visit.    Facility-Administered Medications Ordered in Other Visits  Medication Dose Route Frequency Provider Last Rate Last Dose  . denosumab (XGEVA) injection 120 mg  120 mg Subcutaneous Once [name], MD      . goserelin (ZOLADEX) injection 3.6 mg  3.6 mg Subcutaneous Q28 days [name], MD   3.6 mg at 01/27/16 1009  . goserelin (ZOLADEX) injection 3.6 mg  3.6 mg Subcutaneous Q28 days [name], MD       imging is now working  PHYSICAL EXAMINATION: ECOG PERFORMANCE STATUS: 1 - Symptomatic but completely ambulatory  There were no vitals filed for this visit. There were no vitals filed for this visit.  GENERAL:alert, no distress and comfortable SKIN: skin color, texture, turgor are normal, no rashes or significant lesions EYES: normal, Conjunctiva are pink and non-injected, sclera clear OROPHARYNX:no exudate, no erythema and lips, buccal mucosa, and tongue normal  NECK: supple, thyroid normal size, non-tender, without nodularity LYMPH:  no palpable  lymphadenopathy in the cervical, axillary or inguinal LUNGS: clear to auscultation and percussion with normal breathing effort HEART: regular rate & rhythm and no murmurs and no lower extremity edema ABDOMEN:abdomen soft, non-tender and normal bowel sounds MUSCULOSKELETAL:no cyanosis of digits and no clubbing  NEURO: alert & oriented x 3 with fluent speech, no focal motor/sensory deficits EXTREMITIES: No lower extremity edema  LABORATORY DATA:  I have reviewed the data as listed   Chemistry      Component Value Date/Time   NA 137 03/23/2016 0920   K 4.2 03/23/2016 0920   CL 98 (L) 01/04/2016 0504   CO2 23 03/23/2016 0920   BUN 4.7 (L) 03/23/2016 0920   CREATININE 0.7 03/23/2016 0920      Component Value Date/Time   CALCIUM 8.5 03/23/2016 0920   ALKPHOS 104 03/23/2016 0920   AST 12 03/23/2016 0920   ALT 26 03/23/2016 0920   BILITOT 0.33 03/23/2016 0920       Lab Results  Component Value Date   WBC 1.5 (L) 03/23/2016   HGB 10.2 (L) 03/23/2016   HCT 31.4 (L) 03/23/2016   MCV 94.9 03/23/2016   PLT 169 03/23/2016   NEUTROABS 1.2 (L) 03/23/2016    ASSESSMENT & PLAN:  Breast cancer of upper-inner quadrant of right female breast (HCC) 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   Treatment summary:  1. Patient completed neoadjuvant dose dense Adriamycin and Cytoxan and weekly Taxol X 12 started 02/09/2014 and completed 07/01/14 2. Right double lumpectomy 2:00: IDC grade 3, 3.2 cm, high-grade DCIS, LV I present, PNI present,; 12:00: IDC grade 3; 0.8 cm, high-grade DCIS, LVI, 4/4 lymph nodes positive with ECE, ER 90%, PR 40%, HER-2 negative margin positive T2 N2 M0 stage IIIa 3. Patient is on [employer] clinical trial and she was randomized intraoperatively for NO lymph node dissection. 4.Tamoxifen 20 mg daily started 12/29/2014 stopped July 2016  for adverse effects 5. 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 Palliative XRT to the spine for cord compression --------------------------------------------------------------------------------------------------------------------------------------------------------------- Bone Mets: Xgeva with calcium and vitamin D  Current treatment: Ribociclibwith letrozole plus ovarian suppression  Ribociclib toxicities:  1. Fatigue 2. Neutropenia ANC 1.2: Based on the guidelines we will  keep the dosage the same Monitoring EKG and blood work very closely.   EKG done todayslight tachycardiac QTC 445 ms Goals of care were discussed: Palliation Return to clinic in one month for toxicity check and repeat EKG for QT prolongation review   Orders Placed This Encounter  Procedures  . CT Abdomen Pelvis W Contrast    Standing Status:   Future    Standing Expiration Date:   03/23/2017    Order Specific Question:   If indicated for the ordered procedure, I authorize the administration of contrast media per Radiology protocol    Answer:   Yes    Order Specific Question:   Reason for Exam (SYMPTOM  OR DIAGNOSIS REQUIRED)    Answer:   Metastatic breast cancer restaging on Ribociclib    Order Specific Question:   Is patient pregnant?    Answer:   No    Order Specific Question:   Preferred imaging location?    Answer:   [facility]  . CT Chest W Contrast    Standing Status:   Future    Standing Expiration Date:   03/23/2017    Order Specific Question:   If indicated for the ordered procedure, I authorize the administration of contrast media per Radiology protocol    Answer:   Yes    Order Specific Question:   Reason for Exam (SYMPTOM  OR DIAGNOSIS REQUIRED)    Answer:   Metastatic breast cancer restaging on Ribociclib    Order Specific Question:   Is patient pregnant?    Answer:    No    Order Specific Question:   Preferred imaging location?    Answer:   [facility]   The patient has a good understanding of the overall plan. she agrees with it. she will call with any problems that may develop before the next visit here.   Rulon Eisenmenger, MD 03/23/16

## 2016-04-07 ENCOUNTER — Other Ambulatory Visit: Payer: Self-pay | Admitting: Nurse Practitioner

## 2016-04-13 MED FILL — LETROZOLE 2.5 MG TABLET: 2.5 | 30 days supply | Qty: 30 | Fill #2

## 2016-04-19 ENCOUNTER — Telehealth: Payer: Self-pay | Admitting: Emergency Medicine

## 2016-04-19 ENCOUNTER — Other Ambulatory Visit: Payer: Self-pay | Admitting: Emergency Medicine

## 2016-04-19 DIAGNOSIS — C50211 Malignant neoplasm of upper-inner quadrant of right female breast: Secondary | ICD-10-CM

## 2016-04-19 DIAGNOSIS — Z170 Estrogen receptor positive status [ER+]: Principal | ICD-10-CM

## 2016-04-19 NOTE — Telephone Encounter (Signed)
Called and left message on patient's voicemail instructing her to come in at 815 on 12/29 for labs and injection afterwards.

## 2016-04-20 ENCOUNTER — Other Ambulatory Visit: Payer: Self-pay | Admitting: Medical Oncology

## 2016-04-20 ENCOUNTER — Other Ambulatory Visit: Payer: Medicaid Other

## 2016-04-20 ENCOUNTER — Ambulatory Visit (HOSPITAL_BASED_OUTPATIENT_CLINIC_OR_DEPARTMENT_OTHER): Payer: Medicaid Other

## 2016-04-20 ENCOUNTER — Other Ambulatory Visit (HOSPITAL_BASED_OUTPATIENT_CLINIC_OR_DEPARTMENT_OTHER): Payer: Medicaid Other

## 2016-04-20 ENCOUNTER — Telehealth: Payer: Self-pay | Admitting: Medical Oncology

## 2016-04-20 VITALS — BP 140/83 | HR 108 | Temp 98.1°F | Resp 20

## 2016-04-20 DIAGNOSIS — C50211 Malignant neoplasm of upper-inner quadrant of right female breast: Secondary | ICD-10-CM | POA: Diagnosis not present

## 2016-04-20 DIAGNOSIS — Z170 Estrogen receptor positive status [ER+]: Principal | ICD-10-CM

## 2016-04-20 DIAGNOSIS — Z5111 Encounter for antineoplastic chemotherapy: Secondary | ICD-10-CM

## 2016-04-20 DIAGNOSIS — C7951 Secondary malignant neoplasm of bone: Secondary | ICD-10-CM

## 2016-04-20 DIAGNOSIS — C50911 Malignant neoplasm of unspecified site of right female breast: Secondary | ICD-10-CM

## 2016-04-20 LAB — COMPREHENSIVE METABOLIC PANEL
ALBUMIN: 3.9 g/dL (ref 3.5–5.0)
ALT: 17 U/L (ref 0–55)
ANION GAP: 11 meq/L (ref 3–11)
AST: 13 U/L (ref 5–34)
Alkaline Phosphatase: 101 U/L (ref 40–150)
BILIRUBIN TOTAL: 0.25 mg/dL (ref 0.20–1.20)
BUN: 6.3 mg/dL — ABNORMAL LOW (ref 7.0–26.0)
CO2: 24 meq/L (ref 22–29)
CREATININE: 0.7 mg/dL (ref 0.6–1.1)
Calcium: 9.2 mg/dL (ref 8.4–10.4)
Chloride: 106 mEq/L (ref 98–109)
EGFR: >90 mL/min/{1.73_m2} (ref >90)
GLUCOSE: 215 mg/dL — AB (ref 70–140)
Potassium: 3.3 mEq/L — ABNORMAL LOW (ref 3.5–5.1)
Sodium: 142 mEq/L (ref 136–145)
TOTAL PROTEIN: 7.8 g/dL (ref 6.4–8.3)

## 2016-04-20 MED ORDER — RIBOCICLIB SUCCINATE 200 MG PO TABS: 600.0000 mg | ORAL_TABLET | Freq: Every day | ORAL | 6 refills | Status: DC

## 2016-04-20 MED ORDER — DENOSUMAB 120 MG/1.7ML ~~LOC~~ SOLN
120.0000 mg | Freq: Once | SUBCUTANEOUS | Status: AC
  Administered 2016-04-20: 120 mg via SUBCUTANEOUS
  Filled 2016-04-20: qty 1.7

## 2016-04-20 MED ORDER — GOSERELIN ACETATE 3.6 MG ~~LOC~~ IMPL
3.6000 mg | DRUG_IMPLANT | SUBCUTANEOUS | Status: DC
  Administered 2016-04-20: 3.6 mg via SUBCUTANEOUS
  Filled 2016-04-20: qty 3.6

## 2016-04-20 MED FILL — KISQALI 200 MG DAILY DOSE: 200 | 28 days supply | Qty: 63 | Fill #3

## 2016-04-20 NOTE — Patient Instructions (Signed)
Goserelin injection What is this medicine? GOSERELIN (GOE se rel in) is similar to a hormone found in the body. It lowers the amount of sex hormones that the body makes. Men will have lower testosterone levels and women will have lower estrogen levels while taking this medicine. In men, this medicine is used to treat prostate cancer; the injection is either given once per month or once every 12 weeks. A once per month injection (only) is used to treat women with endometriosis, dysfunctional uterine bleeding, or advanced breast cancer. This medicine may be used for other purposes; ask your health care provider or pharmacist if you have questions. What should I tell my health care provider before I take this medicine? They need to know if you have any of these conditions (some only apply to women): -diabetes -heart disease or previous heart attack -high blood pressure -high cholesterol -kidney disease -osteoporosis or low bone density -problems passing urine -spinal cord injury -stroke -tobacco smoker -an unusual or allergic reaction to goserelin, hormone therapy, other medicines, foods, dyes, or preservatives -pregnant or trying to get pregnant -breast-feeding How should I use this medicine? This medicine is for injection under the skin. It is given by a health care professional in a hospital or clinic setting. Men receive this injection once every 4 weeks or once every 12 weeks. Women will only receive the once every 4 weeks injection. Talk to your pediatrician regarding the use of this medicine in children. Special care may be needed. Overdosage: If you think you have taken too much of this medicine contact a poison control center or emergency room at once. NOTE: This medicine is only for you. Do not share this medicine with others. What if I miss a dose? It is important not to miss your dose. Call your doctor or health care professional if you are unable to keep an appointment. What may  interact with this medicine? -female hormones like estrogen -herbal or dietary supplements like black cohosh, chasteberry, or DHEA -female hormones like testosterone -prasterone This list may not describe all possible interactions. Give your health care provider a list of all the medicines, herbs, non-prescription drugs, or dietary supplements you use. Also tell them if you smoke, drink alcohol, or use illegal drugs. Some items may interact with your medicine. What should I watch for while using this medicine? Visit your doctor or health care professional for regular checks on your progress. Your symptoms may appear to get worse during the first weeks of this therapy. Tell your doctor or healthcare professional if your symptoms do not start to get better or if they get worse after this time. Your bones may get weaker if you take this medicine for a long time. If you smoke or frequently drink alcohol you may increase your risk of bone loss. A family history of osteoporosis, chronic use of drugs for seizures (convulsions), or corticosteroids can also increase your risk of bone loss. Talk to your doctor about how to keep your bones strong. This medicine should stop regular monthly menstration in women. Tell your doctor if you continue to menstrate. Women should not become pregnant while taking this medicine or for 12 weeks after stopping this medicine. Women should inform their doctor if they wish to become pregnant or think they might be pregnant. There is a potential for serious side effects to an unborn child. Talk to your health care professional or pharmacist for more information. Do not breast-feed an infant while taking this medicine. Men should   inform their doctors if they wish to father a child. This medicine may lower sperm counts. Talk to your health care professional or pharmacist for more information. What side effects may I notice from receiving this medicine? Side effects that you should  report to your doctor or health care professional as soon as possible: -allergic reactions like skin rash, itching or hives, swelling of the face, lips, or tongue -bone pain -breathing problems -changes in vision -chest pain -feeling faint or lightheaded, falls -fever, chills -pain, swelling, warmth in the leg -pain, tingling, numbness in the hands or feet -signs and symptoms of low blood pressure like dizziness; feeling faint or lightheaded, falls; unusually weak or tired -stomach pain -swelling of the ankles, feet, hands -trouble passing urine or change in the amount of urine -unusually high or low blood pressure -unusually weak or tired Side effects that usually do not require medical attention (report to your doctor or health care professional if they continue or are bothersome): -change in sex drive or performance -changes in breast size in both males and females -changes in emotions or moods -headache -hot flashes -irritation at site where injected -loss of appetite -skin problems like acne, dry skin -vaginal dryness This list may not describe all possible side effects. Call your doctor for medical advice about side effects. You may report side effects to FDA at 1-800-FDA-1088. Where should I keep my medicine? This drug is given in a hospital or clinic and will not be stored at home. NOTE: This sheet is a summary. It may not cover all possible information. If you have questions about this medicine, talk to your doctor, pharmacist, or health care provider.    2016, [employer] Standard. (2013-06-16 11:10:35) Denosumab injection What is this medicine? DENOSUMAB (den oh sue mab) slows bone breakdown. Prolia is used to treat osteoporosis in women after menopause and in men. Delton See is used to prevent bone fractures and other bone problems caused by cancer bone metastases. Delton See is also used to treat giant cell tumor of the bone. This medicine may be used for other purposes; ask  your health care provider or pharmacist if you have questions. What should I tell my health care provider before I take this medicine? They need to know if you have any of these conditions: -dental disease -eczema -infection or history of infections -kidney disease or on dialysis -low blood calcium or vitamin D -malabsorption syndrome -scheduled to have surgery or tooth extraction -taking medicine that contains denosumab -thyroid or parathyroid disease -an unusual reaction to denosumab, other medicines, foods, dyes, or preservatives -pregnant or trying to get pregnant -breast-feeding How should I use this medicine? This medicine is for injection under the skin. It is given by a health care professional in a hospital or clinic setting. If you are getting Prolia, a special MedGuide will be given to you by the pharmacist with each prescription and refill. Be sure to read this information carefully each time. For Prolia, talk to your pediatrician regarding the use of this medicine in children. Special care may be needed. For Delton See, talk to your pediatrician regarding the use of this medicine in children. While this drug may be prescribed for children as young as 13 years for selected conditions, precautions do apply. Overdosage: If you think you have taken too much of this medicine contact a poison control center or emergency room at once. NOTE: This medicine is only for you. Do not share this medicine with others. What if I miss a dose?  It is important not to miss your dose. Call your doctor or health care professional if you are unable to keep an appointment. What may interact with this medicine? Do not take this medicine with any of the following medications: -other medicines containing denosumab This medicine may also interact with the following medications: -medicines that suppress the immune system -medicines that treat cancer -steroid medicines like prednisone or cortisone This list  may not describe all possible interactions. Give your health care provider a list of all the medicines, herbs, non-prescription drugs, or dietary supplements you use. Also tell them if you smoke, drink alcohol, or use illegal drugs. Some items may interact with your medicine. What should I watch for while using this medicine? Visit your doctor or health care professional for regular checks on your progress. Your doctor or health care professional may order blood tests and other tests to see how you are doing. Call your doctor or health care professional if you get a cold or other infection while receiving this medicine. Do not treat yourself. This medicine may decrease your body's ability to fight infection. You should make sure you get enough calcium and vitamin D while you are taking this medicine, unless your doctor tells you not to. Discuss the foods you eat and the vitamins you take with your health care professional. See your dentist regularly. Brush and floss your teeth as directed. Before you have any dental work done, tell your dentist you are receiving this medicine. Do not become pregnant while taking this medicine or for 5 months after stopping it. Women should inform their doctor if they wish to become pregnant or think they might be pregnant. There is a potential for serious side effects to an unborn child. Talk to your health care professional or pharmacist for more information. What side effects may I notice from receiving this medicine? Side effects that you should report to your doctor or health care professional as soon as possible: -allergic reactions like skin rash, itching or hives, swelling of the face, lips, or tongue -breathing problems -chest pain -fast, irregular heartbeat -feeling faint or lightheaded, falls -fever, chills, or any other sign of infection -muscle spasms, tightening, or twitches -numbness or tingling -skin blisters or bumps, or is dry, peels, or red -slow  healing or unexplained pain in the mouth or jaw -unusual bleeding or bruising Side effects that usually do not require medical attention (Report these to your doctor or health care professional if they continue or are bothersome.): -muscle pain -stomach upset, gas This list may not describe all possible side effects. Call your doctor for medical advice about side effects. You may report side effects to FDA at 1-800-FDA-1088. Where should I keep my medicine? This medicine is only given in a clinic, doctor's office, or other health care setting and will not be stored at home. NOTE: This sheet is a summary. It may not cover all possible information. If you have questions about this medicine, talk to your doctor, pharmacist, or health care provider.    2016, [employer] Standard. (2011-10-08 12:37:47)

## 2016-04-20 NOTE — Telephone Encounter (Signed)
Refill ribociclib-send to [facility] [place] -wl cannot get in it until later.

## 2016-05-17 ENCOUNTER — Ambulatory Visit (HOSPITAL_COMMUNITY): Payer: Medicaid Other

## 2016-05-17 NOTE — Assessment & Plan Note (Signed)
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   Treatment summary:  1. Patient completed neoadjuvant dose dense Adriamycin and Cytoxan and weekly Taxol X 12 started 02/09/2014 and completed 07/01/14 2. Right double lumpectomy 2:00: IDC grade 3, 3.2 cm, high-grade DCIS, LV I present, PNI present,; 12:00: IDC grade 3; 0.8 cm, high-grade DCIS, LVI, 4/4 lymph nodes positive with ECE, ER 90%, PR 40%, HER-2 negative margin positive T2 N2 M0 stage IIIa 3. Patient is on [employer] clinical trial and she was randomized intraoperatively for NO lymph node dissection. 4.Tamoxifen 20 mg daily started 12/29/2014 stopped July 2016 for adverse effects 5. 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 Palliative XRT to the spine for cord compression --------------------------------------------------------------------------------------------------------------------------------------------------------------- Bone Mets: Xgeva with calcium and vitamin D  Current treatment: Ribociclibwith letrozole plus ovarian suppression  Ribociclib toxicities:  1.Fatigue 2. Neutropenia ANC 1.2: Based on the guidelines we will keep the dosage the same Monitoring EKG and blood work very closely.   EKG done todayslight tachycardiac QTC 445 ms Goals of care were discussed: Palliation Return to clinic in one month for toxicity check and repeat EKG for QT prolongation review

## 2016-05-18 ENCOUNTER — Ambulatory Visit (HOSPITAL_BASED_OUTPATIENT_CLINIC_OR_DEPARTMENT_OTHER): Payer: Medicaid Other | Admitting: Hematology and Oncology

## 2016-05-18 ENCOUNTER — Ambulatory Visit (HOSPITAL_BASED_OUTPATIENT_CLINIC_OR_DEPARTMENT_OTHER): Payer: Medicaid Other

## 2016-05-18 ENCOUNTER — Encounter: Payer: Self-pay | Admitting: Hematology and Oncology

## 2016-05-18 DIAGNOSIS — C50911 Malignant neoplasm of unspecified site of right female breast: Secondary | ICD-10-CM

## 2016-05-18 DIAGNOSIS — Z79811 Long term (current) use of aromatase inhibitors: Secondary | ICD-10-CM

## 2016-05-18 DIAGNOSIS — Z5111 Encounter for antineoplastic chemotherapy: Secondary | ICD-10-CM | POA: Diagnosis present

## 2016-05-18 DIAGNOSIS — C7951 Secondary malignant neoplasm of bone: Secondary | ICD-10-CM

## 2016-05-18 DIAGNOSIS — C50211 Malignant neoplasm of upper-inner quadrant of right female breast: Secondary | ICD-10-CM | POA: Diagnosis not present

## 2016-05-18 DIAGNOSIS — C773 Secondary and unspecified malignant neoplasm of axilla and upper limb lymph nodes: Secondary | ICD-10-CM

## 2016-05-18 DIAGNOSIS — Z170 Estrogen receptor positive status [ER+]: Secondary | ICD-10-CM

## 2016-05-18 DIAGNOSIS — D701 Agranulocytosis secondary to cancer chemotherapy: Secondary | ICD-10-CM

## 2016-05-18 LAB — COMPREHENSIVE METABOLIC PANEL
ALBUMIN: 3.9 g/dL (ref 3.5–5.0)
ALK PHOS: 74 U/L (ref 40–150)
ALT: 28 U/L (ref 0–55)
ANION GAP: 10 meq/L (ref 3–11)
AST: 18 U/L (ref 5–34)
BILIRUBIN TOTAL: 0.27 mg/dL (ref 0.20–1.20)
BUN: 10.6 mg/dL (ref 7.0–26.0)
CALCIUM: 9.3 mg/dL (ref 8.4–10.4)
CO2: 25 meq/L (ref 22–29)
CREATININE: 0.7 mg/dL (ref 0.6–1.1)
Chloride: 104 mEq/L (ref 98–109)
EGFR: >90 mL/min/{1.73_m2} (ref >90)
Glucose: 177 mg/dl — ABNORMAL HIGH (ref 70–140)
Potassium: 3.3 mEq/L — ABNORMAL LOW (ref 3.5–5.1)
Sodium: 139 mEq/L (ref 136–145)
TOTAL PROTEIN: 7.2 g/dL (ref 6.4–8.3)

## 2016-05-18 LAB — CBC WITH DIFFERENTIAL/PLATELET
BASO%: 0.4 % (ref 0.0–2.0)
Basophils Absolute: 0 10*3/uL (ref 0.0–0.1)
EOS ABS: 0 10*3/uL (ref 0.0–0.5)
EOS%: 2.1 % (ref 0.0–7.0)
HEMATOCRIT: 32.8 % — AB (ref 34.8–46.6)
HEMOGLOBIN: 10.8 g/dL — AB (ref 11.6–15.9)
LYMPH#: 0.3 10*3/uL — AB (ref 0.9–3.3)
LYMPH%: 16.4 % (ref 14.0–49.7)
MCH: 31.3 pg (ref 25.1–34.0)
MCHC: 32.9 g/dL (ref 31.5–36.0)
MCV: 95.3 fL (ref 79.5–101.0)
MONO#: 0.2 10*3/uL (ref 0.1–0.9)
MONO%: 13.6 % (ref 0.0–14.0)
NEUT%: 67.5 % (ref 38.4–76.8)
NEUTROS ABS: 1.2 10*3/uL — AB (ref 1.5–6.5)
PLATELETS: 218 10*3/uL (ref 145–400)
RBC: 3.44 10*6/uL — ABNORMAL LOW (ref 3.70–5.45)
RDW: 16.4 % — ABNORMAL HIGH (ref 11.2–14.5)
WBC: 1.8 10*3/uL — AB (ref 3.9–10.3)

## 2016-05-18 MED ORDER — DENOSUMAB 120 MG/1.7ML ~~LOC~~ SOLN
120.0000 mg | Freq: Once | SUBCUTANEOUS | Status: AC
  Administered 2016-05-18: 120 mg via SUBCUTANEOUS
  Filled 2016-05-18: qty 1.7

## 2016-05-18 MED ORDER — GOSERELIN ACETATE 3.6 MG ~~LOC~~ IMPL
3.6000 mg | DRUG_IMPLANT | Freq: Once | SUBCUTANEOUS | Status: AC
  Administered 2016-05-18: 3.6 mg via SUBCUTANEOUS
  Filled 2016-05-18: qty 3.6

## 2016-05-18 NOTE — Addendum Note (Signed)
Addended by: Carlene Coria L on: 05/18/2016 11:57 AM   Modules accepted: Orders

## 2016-05-18 NOTE — Progress Notes (Signed)
Patient Care Team: Provider Not In System as PCP - [place] III, MD as Consulting Physician (General Surgery) [name], MD as Consulting Physician (Hematology and Oncology) [name], MD as Attending Physician (Radiation Oncology) Benson Norway, RN as Registered Nurse (Oncology) Sylvan Cheese, NP as Nurse Practitioner (Hematology and Oncology)  DIAGNOSIS:  Encounter Diagnosis  Name Primary?  . Malignant neoplasm of upper-inner quadrant of right breast in female, estrogen receptor positive ([place])     SUMMARY OF ONCOLOGIC HISTORY:   Breast cancer of upper-inner quadrant of right female breast ([facility])   01/12/2014 Mammogram    Right breast - two lesions: #1 2:00 position 1.6 x 1.9 cm and #2 12:00 position 0.5 x 0.9 cm. Distance between the 2 was 4.5 cm, right axillary lymph node enlargement      01/12/2014 Initial Biopsy    Right breast 3 biopsies: All were IDC with DCIS ER+ PR + Ki-67 85% HER-2 negative: Right axillary lymph node also positive for metastatic cancer ER positive HER-2 negative Ki-67 80% grade 2      01/18/2014 Breast MRI    Right breast 12:00 position 2.8 x 2 x 2.2 cm: 2:00 position 1.3 x 0.8 x 0.5 cm, right axilla multiple enlarged lymph nodes 1 cm in size, right retropectoral lymph node 0.6 cm      01/18/2014 Clinical Stage    Stage IIB T2 N1      01/25/2014 Procedure    Breast/Ovarian ([facility]) reveals no clinically significant variant at ATM, BARD1, BRCA1, BRCA2, BRIP1, CDH1, CHEK2, EPCAM, FANCC, MLH1, MSH2, MSH6, NBN, PALB2, PMS2, PTEN, RAD51C, RAD51D, TP53, and XRCC2.       02/11/2014 -  Neo-Adjuvant Chemotherapy    Doxorubicin and cyclophosphamide X 4 followed by Taxol weekly x12       07/12/2014 Breast MRI    Partial response to neoadjuvant chemotherapy right breast mass decreased from 2.8 cm to 2.3 cm, 2 small masses along the medial margin also decreased; significant decrease right breast mass 11 to 12:00 and 13 mm to 8 mm,  decrease in right axilla LN      08/23/2014 Definitive Surgery    Right lumpectomy/SLNB [name]) 2:00: IDC grade 3, 3.2 cm, high-grade DCIS, LV I present, PNI present,; 12:00: IDC grade 30.8 cm, high-grade DCIS, LV I, 4/4 lymph nodes positive with ECE, ER 90%, PR 40%, HER-2 negative margin positive      08/23/2014 Pathologic Stage    Stage IIIA: ypT2 ypN2a      09/15/2014 Surgery    Reexcision of margins: clear; no evidence of malignancy      11/03/2014 - 12/14/2014 Radiation Therapy    Adjuvant XRT Isidore Moos): Right Breast, supraclavicular nodes, axillary nodes, and internal mammary nodes: 50 Gy over 25 fractions; right breast boost: 10 Gy over 5 fractions. Total dose: 60 Gy      12/29/2014 -  Anti-estrogen oral therapy    Tamoxifen 20 mg daily      04/08/2015 Survivorship    Survivorship care plan completed and mailed to patient in lieu of in person visit at her request      12/31/2015 Relapse/Recurrence    Innumerable osseous metastases with acute pathologic fracture the L4 vertebral body. 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 L5-S1 protrusion with left more than right S1 impingement      01/05/2016 - 01/12/2016 Radiation Therapy    Palliative radiation to the spine for cord compression      01/13/2016 -  Anti-estrogen oral therapy    Ribociclib 3 weeks on and 1 week off and Letrozole (with Zolodex until oophorectomy) and Xgeva for bone mets       CHIEF COMPLIANT: Follow-up on Ribociclib with letrozole  INTERVAL HISTORY: OLA RAAP is a 41 year old with above-mentioned history of metastatic breast cancer who is now on Ribociclib with letrozole. She is tolerating Ribociclib fairly well. She does complain of occasional fatigue. But overall her back pain appears to be better. But it still hurts.  REVIEW OF SYSTEMS:   Constitutional: Denies fevers, chills or abnormal weight loss Eyes: Denies blurriness of vision Ears, nose, mouth, throat, and  face: Denies mucositis or sore throat Respiratory: Denies cough, dyspnea or wheezes Cardiovascular: Denies palpitation, chest discomfort Gastrointestinal:  Denies nausea, heartburn or change in bowel habits Skin: Denies abnormal skin rashes Lymphatics: Denies new lymphadenopathy or easy bruising Neurological:Denies numbness, tingling or new weaknesses Behavioral/Psych: Mood is stable, no new changes  Extremities: No lower extremity edema Breast:  denies any pain or lumps or nodules in either breasts All other systems were reviewed with the patient and are negative.  I have reviewed the past medical history, past surgical history, social history and family history with the patient and they are unchanged from previous note.  ALLERGIES:  is allergic to lisinopril and tamoxifen.  MEDICATIONS:  Current Outpatient Prescriptions  Medication Sig Dispense Refill  . ACCU-CHEK AVIVA PLUS test strip USE AS DIRECTED 4 TIMES A DAY 100 each 0  . ACCU-CHEK SOFTCLIX LANCETS lancets 100 each by Other route 4 (four) times daily. Use as instructed 100 each 0  . atenolol (TENORMIN) 50 MG tablet Take 50 mg by mouth daily.    . blood glucose meter kit and supplies KIT Dispense based on patient and insurance preference. Use up to four times daily as directed. (FOR ICD-9 250.00, 250.01). 1 each 0  . Cholecalciferol (VITAMIN D-3 PO) Take 1 capsule by mouth daily.    . diazepam (VALIUM) 5 MG tablet Take 1 tablet (5 mg total) by mouth 2 (two) times daily. (Patient not taking: Reported on 03/14/2016) 10 tablet 0  . ENSURE (ENSURE) Take 237 mLs by mouth 2 (two) times daily between meals.     . Iron-Vitamins (GERITOL) LIQD Take 5 mLs by mouth daily.    Marland Kitchen letrozole (FEMARA) 2.5 MG tablet Take 1 tablet (2.5 mg total) by mouth daily. 90 tablet 3  . Magnesium Salicylate (DOANS PILLS PO) Take 2 tablets by mouth daily as needed (for pain).    . metFORMIN (GLUCOPHAGE) 500 MG tablet Take 1 tablet (500 mg total) by mouth 2  (two) times daily with a meal. 60 tablet 0  . methocarbamol (ROBAXIN) 500 MG tablet Take 1 tablet (500 mg total) by mouth 2 (two) times daily as needed for muscle spasms. (Patient taking differently: Take 1,000 mg by mouth 4 (four) times daily as needed for muscle spasms. ) 20 tablet 0  . omeprazole (PRILOSEC) 20 MG capsule Take 1 capsule (20 mg total) by mouth daily. Take while using dexamethasone to prevent heartburn. 30 capsule 2  . oxyCODONE-acetaminophen (PERCOCET/ROXICET) 5-325 MG tablet Take 1 tablet by mouth every 6 (six) hours as needed for severe pain. 120 tablet 0  . polyethylene glycol (MIRALAX / GLYCOLAX) packet Take 17 g by mouth daily. 14 each 0  . Ribociclib Succinate 200 MG TABS Take 600 mg by mouth daily. 3 weeks on 1 week off 63 tablet 6  . traMADol (ULTRAM) 50 MG tablet  Take 50-100 mg by mouth every 6 (six) hours as needed (for pain).     No current facility-administered medications for this visit.    Facility-Administered Medications Ordered in Other Visits  Medication Dose Route Frequency Provider Last Rate Last Dose  . goserelin (ZOLADEX) injection 3.6 mg  3.6 mg Subcutaneous Q28 days [name], MD   3.6 mg at 01/27/16 1009  . goserelin (ZOLADEX) injection 3.6 mg  3.6 mg Subcutaneous Q28 days [name], MD   3.6 mg at 03/23/16 1122  . goserelin (ZOLADEX) injection 3.6 mg  3.6 mg Subcutaneous Once [name], MD        PHYSICAL EXAMINATION: ECOG PERFORMANCE STATUS: 1 - Symptomatic but completely ambulatory  Vitals:   05/18/16 1108  BP: (!) 151/99  Pulse: (!) 103  Resp: 17  Temp: 98.4 F (36.9 C)   Filed Weights   05/18/16 1108  Weight: 179 lb 6.4 oz (81.4 kg)    GENERAL:alert, no distress and comfortable SKIN: skin color, texture, turgor are normal, no rashes or significant lesions EYES: normal, Conjunctiva are pink and non-injected, sclera clear OROPHARYNX:no exudate, no erythema and lips, buccal mucosa, and tongue normal  NECK: supple, thyroid  normal size, non-tender, without nodularity LYMPH:  no palpable lymphadenopathy in the cervical, axillary or inguinal LUNGS: clear to auscultation and percussion with normal breathing effort HEART: regular rate & rhythm and no murmurs and no lower extremity edema ABDOMEN:abdomen soft, non-tender and normal bowel sounds MUSCULOSKELETAL:no cyanosis of digits and no clubbing  NEURO: alert & oriented x 3 with fluent speech, no focal motor/sensory deficits EXTREMITIES: No lower extremity edema  LABORATORY DATA:  I have reviewed the data as listed   Chemistry      Component Value Date/Time   NA 139 05/18/2016 1007   K 3.3 (L) 05/18/2016 1007   CL 98 (L) 01/04/2016 0504   CO2 25 05/18/2016 1007   BUN 10.6 05/18/2016 1007   CREATININE 0.7 05/18/2016 1007      Component Value Date/Time   CALCIUM 9.3 05/18/2016 1007   ALKPHOS 74 05/18/2016 1007   AST 18 05/18/2016 1007   ALT 28 05/18/2016 1007   BILITOT 0.27 05/18/2016 1007       Lab Results  Component Value Date   WBC 1.8 (L) 05/18/2016   HGB 10.8 (L) 05/18/2016   HCT 32.8 (L) 05/18/2016   MCV 95.3 05/18/2016   PLT 218 05/18/2016   NEUTROABS 1.2 (L) 05/18/2016    ASSESSMENT & PLAN:  Breast cancer of upper-inner quadrant of right female breast ([place]) 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   Treatment summary:  1. Patient completed neoadjuvant dose dense Adriamycin and Cytoxan and weekly Taxol X 12 started 02/09/2014 and completed 07/01/14 2. Right double lumpectomy 2:00: IDC grade 3, 3.2 cm, high-grade DCIS, LV I present, PNI present,; 12:00: IDC grade 3; 0.8 cm, high-grade DCIS, LVI, 4/4 lymph nodes positive with ECE, ER 90%, PR 40%, HER-2 negative margin positive T2 N2 M0 stage IIIa 3. Patient is on [facility] clinical trial and she was randomized intraoperatively for NO lymph node  dissection. 4.Tamoxifen 20 mg daily started 12/29/2014 stopped July 2016 for adverse effects 5. 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 Palliative XRT to the spine for cord compression --------------------------------------------------------------------------------------------------------------------------------------------------------------- Bone Mets: Xgeva with calcium and vitamin D  Current treatment: Ribociclibwith letrozole plus ovarian suppression  Ribociclib toxicities:  1.Fatigue 2. Neutropenia ANC 1.2: Based on the guidelines we will keep the dosage the same  Goals of care were discussed: Palliation Return to clinic in one month for toxicity check. Our plan is to perform CT scans in February. She has those already set up.  I spent 25 minutes talking to the patient of which more than half was spent in counseling and coordination of care.  No orders of the defined types were placed in this encounter.  The patient has a good understanding of the overall plan. she agrees with it. she will call with any problems that may develop before the next visit here.   Rulon Eisenmenger, MD 05/18/16

## 2016-05-24 ENCOUNTER — Ambulatory Visit (HOSPITAL_COMMUNITY)
Admission: RE | Admit: 2016-05-24 | Discharge: 2016-05-24 | Disposition: A | Discharge status: Home / Self Care | Payer: Medicaid Other | Source: Ambulatory Visit | Attending: Hematology and Oncology | Admitting: Hematology and Oncology

## 2016-05-24 DIAGNOSIS — R938 Abnormal findings on diagnostic imaging of other specified body structures: Secondary | ICD-10-CM | POA: Diagnosis not present

## 2016-05-24 DIAGNOSIS — Z9889 Other specified postprocedural states: Secondary | ICD-10-CM | POA: Insufficient documentation

## 2016-05-24 DIAGNOSIS — Z170 Estrogen receptor positive status [ER+]: Secondary | ICD-10-CM | POA: Diagnosis not present

## 2016-05-24 DIAGNOSIS — C50211 Malignant neoplasm of upper-inner quadrant of right female breast: Secondary | ICD-10-CM | POA: Insufficient documentation

## 2016-05-24 DIAGNOSIS — C7951 Secondary malignant neoplasm of bone: Secondary | ICD-10-CM | POA: Insufficient documentation

## 2016-05-24 MED ORDER — IOPAMIDOL (ISOVUE-300) INJECTION 61%
100.0000 mL | Freq: Once | INTRAVENOUS | Status: AC | PRN
  Administered 2016-05-24: 100 mL via INTRAVENOUS

## 2016-05-24 MED ORDER — IOPAMIDOL (ISOVUE-300) INJECTION 61%
INTRAVENOUS | Status: AC
  Filled 2016-05-24: qty 100

## 2016-05-30 ENCOUNTER — Encounter: Payer: Self-pay | Admitting: Radiation Oncology

## 2016-05-30 ENCOUNTER — Ambulatory Visit
Admission: RE | Admit: 2016-05-30 | Discharge: 2016-05-30 | Disposition: A | Discharge status: Home / Self Care | Payer: Medicaid Other | Source: Ambulatory Visit | Attending: Radiation Oncology | Admitting: Radiation Oncology

## 2016-05-30 VITALS — BP 146/103 | HR 97 | Temp 98.6°F | Ht 67.0 in | Wt 181.6 lb

## 2016-05-30 DIAGNOSIS — Z888 Allergy status to other drugs, medicaments and biological substances status: Secondary | ICD-10-CM | POA: Insufficient documentation

## 2016-05-30 DIAGNOSIS — Z7984 Long term (current) use of oral hypoglycemic drugs: Secondary | ICD-10-CM | POA: Insufficient documentation

## 2016-05-30 DIAGNOSIS — Z923 Personal history of irradiation: Secondary | ICD-10-CM | POA: Diagnosis not present

## 2016-05-30 DIAGNOSIS — G893 Neoplasm related pain (acute) (chronic): Secondary | ICD-10-CM

## 2016-05-30 DIAGNOSIS — C7951 Secondary malignant neoplasm of bone: Secondary | ICD-10-CM | POA: Insufficient documentation

## 2016-05-30 MED ORDER — GABAPENTIN 300 MG PO CAPS: 300.0000 mg | ORAL_CAPSULE | Freq: Three times a day (TID) | ORAL | 5 refills | Status: DC

## 2016-05-30 MED FILL — LETROZOLE 2.5 MG TABLET: 2.5 | 30 days supply | Qty: 30 | Fill #3

## 2016-05-30 MED FILL — GABAPENTIN 300 MG CAPSULE: 300 | 30 days supply | Qty: 90 | Fill #0

## 2016-05-30 NOTE — Progress Notes (Signed)
Radiation Oncology         [phone]) [phone] ________________________________  Name: Debbie Martinez MRN: [id number]  Date: 05/30/2016  DOB: 29-Mar-1976  Follow-Up Visit Note  Outpatient  CC: PROVIDER NOT IN SYSTEM  [name], MD  Diagnosis and Prior Radiotherapy:    ICD-9-CM ICD-10-CM   1. Bone metastasis (HCC) 198.5 C79.51 gabapentin (NEURONTIN) 300 MG capsule     01/04/2016-01/23/2016: L2-S2 spine / 35 Gy in 14 fractions  11/03/2014-12/14/2014: 1) Right Breast, supraclavicular nodes, axillary nodes, and internal mammary nodes / 50 Gy in 25 fractions 2) Right Breast Boost / 10 Gy in 5 fractions  CHIEF COMPLAINT: Here for follow-up and surveillance of stage IV breast cancer with osseous metastasis  Narrative:  The patient returns today for routine follow-up of radiation completed on 01/23/16.  She had a CT of chest, abdomen, and pelvis on 05/24/16, which I reviewed, revealing no soft tissue metastasis. The osseous metastases are similar in volume. Many areas have undergone interval sclerosis, likely due to healing. An L4 compression deformity is slightly progressive with ventral canal encroachment. Her current treatment with Dr. Lindi Adie in [place] with calcium and Vitamin E and she is also receiving Ribociclib with letrozole an ovarian suppression.   MRI of the lumbar spine on 12/31/15 showed innumerable osseous metastases with acute pathologic fracture the L4 vertebral body. 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 L5-S1 protrusion with left more than right S1 impingement.  I have reviewed her images from September and February.  On review of systems, she reports a dull pain from right flank down to around the right upper buttock. She also reports occasional pain to her right breast surgical site. The patient feels that she must lean forward while ambulating due to pain. For this pain she is taking 1 Percocet once daily with relief. She wears a brace to help her  with her pain and posture. The patient reports she is eating well. She reports nausea without vomiting, which is relieved after resting for a while. The patient also reports some dry skin, which she attributes to her treatment with Dr. Lindi Adie. She denies leg weakness or numbness. She denies bowel or bladder concerns. The patient has not been taking her BP medicine and reports she needs to find a new PCP since her insurance has changed.   ALLERGIES:  is allergic to lisinopril and tamoxifen.  Meds: Current Outpatient Prescriptions  Medication Sig Dispense Refill  . ACCU-CHEK AVIVA PLUS test strip USE AS DIRECTED 4 TIMES A DAY 100 each 0  . ACCU-CHEK SOFTCLIX LANCETS lancets 100 each by Other route 4 (four) times daily. Use as instructed 100 each 0  . blood glucose meter kit and supplies KIT Dispense based on patient and insurance preference. Use up to four times daily as directed. (FOR ICD-9 250.00, 250.01). 1 each 0  . Cholecalciferol (VITAMIN D-3 PO) Take 1 capsule by mouth daily.    Marland Kitchen ENSURE (ENSURE) Take 237 mLs by mouth 2 (two) times daily between meals.     . Iron-Vitamins (GERITOL) LIQD Take 5 mLs by mouth daily.    Marland Kitchen letrozole (FEMARA) 2.5 MG tablet Take 1 tablet (2.5 mg total) by mouth daily. 90 tablet 3  . methocarbamol (ROBAXIN) 500 MG tablet Take 1 tablet (500 mg total) by mouth 2 (two) times daily as needed for muscle spasms. (Patient taking differently: Take 1,000 mg by mouth 4 (four) times daily as needed for muscle spasms. ) 20 tablet 0  . omeprazole (  PRILOSEC) 20 MG capsule Take 1 capsule (20 mg total) by mouth daily. Take while using dexamethasone to prevent heartburn. 30 capsule 2  . oxyCODONE-acetaminophen (PERCOCET/ROXICET) 5-325 MG tablet Take 1 tablet by mouth every 6 (six) hours as needed for severe pain. 120 tablet 0  . Ribociclib Succinate 200 MG TABS Take 600 mg by mouth daily. 3 weeks on 1 week off 63 tablet 6  . atenolol (TENORMIN) 50 MG tablet Take 50 mg by mouth daily.     . diazepam (VALIUM) 5 MG tablet Take 1 tablet (5 mg total) by mouth 2 (two) times daily. (Patient not taking: Reported on 03/14/2016) 10 tablet 0  . gabapentin (NEURONTIN) 300 MG capsule Take 1 capsule (300 mg total) by mouth 3 (three) times daily. 90 capsule 5  . Magnesium Salicylate (DOANS PILLS PO) Take 2 tablets by mouth daily as needed (for pain).    . metFORMIN (GLUCOPHAGE) 500 MG tablet Take 1 tablet (500 mg total) by mouth 2 (two) times daily with a meal. (Patient not taking: Reported on 05/30/2016) 60 tablet 0  . polyethylene glycol (MIRALAX / GLYCOLAX) packet Take 17 g by mouth daily. (Patient not taking: Reported on 05/30/2016) 14 each 0  . traMADol (ULTRAM) 50 MG tablet Take 50-100 mg by mouth every 6 (six) hours as needed (for pain).     No current facility-administered medications for this encounter.    Facility-Administered Medications Ordered in Other Encounters  Medication Dose Route Frequency Provider Last Rate Last Dose  . goserelin (ZOLADEX) injection 3.6 mg  3.6 mg Subcutaneous Q28 days [name], MD   3.6 mg at 01/27/16 1009  . goserelin (ZOLADEX) injection 3.6 mg  3.6 mg Subcutaneous Q28 days [name], MD   3.6 mg at 03/23/16 1122    Physical Findings: The patient is in no acute distress. Patient is alert and oriented.  height is 5' 7" (1.702 m) and weight is 181 lb 9.6 oz (82.4 kg). Her temperature is 98.6 F (37 C). Her blood pressure is 146/103 (abnormal) and her pulse is 97. Her oxygen saturation is 100%.   The patient presents with a back brace and uses a walker to ambulate occasionally. The patient has tenderness to palpation in mid lumbar spine, which is quite tender when I press there. When I pressed along right flank to right upper buttock, where the patient reported pain, she does not seem to have tenderness.  Lab Findings: Lab Results  Component Value Date   WBC 1.8 (L) 05/18/2016   HGB 10.8 (L) 05/18/2016   HCT 32.8 (L) 05/18/2016   MCV 95.3  05/18/2016   PLT 218 05/18/2016    Radiographic Findings: Ct Chest W Contrast  Result Date: 05/24/2016 CLINICAL DATA:  Right breast cancer diagnosed in 2015. Bone metastasis in 2017. Oral chemotherapy ongoing. Right lumpectomy with lymph node removal. Restaging. EXAM: CT CHEST, ABDOMEN, AND PELVIS WITH CONTRAST TECHNIQUE: Multidetector CT imaging of the chest, abdomen and pelvis was performed following the standard protocol during bolus administration of intravenous contrast. CONTRAST:  139m ISOVUE-300 IOPAMIDOL (ISOVUE-300) INJECTION 61% COMPARISON:  01/01/2016 FINDINGS: CT CHEST FINDINGS Cardiovascular: Mildly tortuous thoracic aorta. Heart size upper normal, without pericardial effusion. No central pulmonary embolism, on this non-dedicated study. Mediastinum/Nodes: No supraclavicular adenopathy. No mediastinal or hilar adenopathy. No internal mammary adenopathy. Lungs/Pleura: No pleural fluid. Anterior right upper lobe radiation fibrosis. The 2 mm right upper lobe pulmonary nodule described previously is no longer identified. No new nodules are seen. Musculoskeletal: Right axillary  node dissection. No axillary adenopathy. A right medial breast fluid collection measures 2.9 x 2.8 cm today versus 4.3 x 3.5 cm on the prior exam. Multifocal osseous metastasis are increasingly conspicuous today with development of extensive sclerosis. No complicating compression deformity. CT ABDOMEN PELVIS FINDINGS Hepatobiliary: Suspicion of mild hepatic steatosis, without focal liver lesion. Normal gallbladder, without biliary ductal dilatation. Pancreas: Normal, without mass or ductal dilatation. Spleen: Normal in size, without focal abnormality. Adrenals/Urinary Tract: Normal adrenal glands. Upper pole left renal 9 mm cyst. Normal right kidney. No hydronephrosis. Normal urinary bladder. Stomach/Bowel: Normal stomach, without wall thickening. Normal colon, appendix, and terminal ileum. Normal small bowel.  Vascular/Lymphatic: Normal caliber of the aorta and branch vessels. No abdominopelvic adenopathy. Reproductive: Normal uterus and adnexa. Other: No significant free fluid. No evidence of omental or peritoneal disease. Musculoskeletal: Widespread osseous metastasis. Many have undergone interval development of sclerosis. Index left posterior iliac lesion measures 1.6 cm today versus 1.5 cm on the prior. A moderate to severe compression deformity with ventral canal encroachment at L4 is slightly progressive. IMPRESSION: 1. No evidence of soft tissue metastasis within the chest, abdomen, or pelvis. 2. Osseous metastasis which are similar in volume. Many areas have undergone interval sclerosis, likely due to healing. An L4 compression deformity is slightly progressive with ventral canal encroachment. 3. The right upper lobe pulmonary nodule described on the prior exam is no longer identified. 4. Decreased size of a presumed postoperative hematoma or seroma in the medial right breast. Electronically Signed   By: Abigail Miyamoto M.D.   On: 05/24/2016 10:43   Ct Abdomen Pelvis W Contrast  Result Date: 05/24/2016 CLINICAL DATA:  Right breast cancer diagnosed in 2015. Bone metastasis in 2017. Oral chemotherapy ongoing. Right lumpectomy with lymph node removal. Restaging. EXAM: CT CHEST, ABDOMEN, AND PELVIS WITH CONTRAST TECHNIQUE: Multidetector CT imaging of the chest, abdomen and pelvis was performed following the standard protocol during bolus administration of intravenous contrast. CONTRAST:  147m ISOVUE-300 IOPAMIDOL (ISOVUE-300) INJECTION 61% COMPARISON:  01/01/2016 FINDINGS: CT CHEST FINDINGS Cardiovascular: Mildly tortuous thoracic aorta. Heart size upper normal, without pericardial effusion. No central pulmonary embolism, on this non-dedicated study. Mediastinum/Nodes: No supraclavicular adenopathy. No mediastinal or hilar adenopathy. No internal mammary adenopathy. Lungs/Pleura: No pleural fluid. Anterior right  upper lobe radiation fibrosis. The 2 mm right upper lobe pulmonary nodule described previously is no longer identified. No new nodules are seen. Musculoskeletal: Right axillary node dissection. No axillary adenopathy. A right medial breast fluid collection measures 2.9 x 2.8 cm today versus 4.3 x 3.5 cm on the prior exam. Multifocal osseous metastasis are increasingly conspicuous today with development of extensive sclerosis. No complicating compression deformity. CT ABDOMEN PELVIS FINDINGS Hepatobiliary: Suspicion of mild hepatic steatosis, without focal liver lesion. Normal gallbladder, without biliary ductal dilatation. Pancreas: Normal, without mass or ductal dilatation. Spleen: Normal in size, without focal abnormality. Adrenals/Urinary Tract: Normal adrenal glands. Upper pole left renal 9 mm cyst. Normal right kidney. No hydronephrosis. Normal urinary bladder. Stomach/Bowel: Normal stomach, without wall thickening. Normal colon, appendix, and terminal ileum. Normal small bowel. Vascular/Lymphatic: Normal caliber of the aorta and branch vessels. No abdominopelvic adenopathy. Reproductive: Normal uterus and adnexa. Other: No significant free fluid. No evidence of omental or peritoneal disease. Musculoskeletal: Widespread osseous metastasis. Many have undergone interval development of sclerosis. Index left posterior iliac lesion measures 1.6 cm today versus 1.5 cm on the prior. A moderate to severe compression deformity with ventral canal encroachment at L4 is slightly progressive. IMPRESSION: 1. No evidence  of soft tissue metastasis within the chest, abdomen, or pelvis. 2. Osseous metastasis which are similar in volume. Many areas have undergone interval sclerosis, likely due to healing. An L4 compression deformity is slightly progressive with ventral canal encroachment. 3. The right upper lobe pulmonary nodule described on the prior exam is no longer identified. 4. Decreased size of a presumed postoperative  hematoma or seroma in the medial right breast. Electronically Signed   By: Abigail Miyamoto M.D.   On: 05/24/2016 10:43    Impression/Plan: She had an excellent response to palliative spine radiotherapy overall but still with chronic and possibly worsening back pain now.   Imaging shows diffuse mets, overall stable, and an L4 compression deformity is slightly progressive with ventral canal encroachment.  I discussed physical therapy with the patient to relieve some of her discomfort. She is nervous about this due to unpleasant PT experiences in the past. I encouraged the patient to at least meet with PT and learn her options. She is hesitant but in agreement at this time. I will refer her to these services.  I will discuss the patient's case at our upcoming tumor board in case there is a strategy for relieving some of the patient's pain. ? Kyphoplasty?  The patient will be notified of her options.  I will prescribe the patient Gabapentin to help with her nerve pain.   She will continue to follow up with medical oncology. She prefers to see me on on an as needed basis.  ____________________________________   [name], MD  This document serves as a record of services personally performed by [name], MD. It was created on her behalf by Maryla Morrow, a trained medical scribe. The creation of this record is based on the scribe's personal observations and the provider's statements to them. This document has been checked and approved by the attending provider.

## 2016-05-30 NOTE — Progress Notes (Signed)
Debbie Martinez presents for follow up of radiation completed 01/23/16 to her L2-S2 Spine , and Right Breast completed 12/14/14. She reports pain at times to her Lower Right Back side, which radiates upwards toward the center of her Right Back. She also reports occasional pain to her Right Breast surgical site. She is taking a percocet about once daily with relief. She wears a brace to help with her pain and posture. She is eating well. She does have nausea without vomiting, but it is relieved after resting for a while. She is taking Letrozole daily and Ribociclib 3 weeks on and 1 week off. She has observed dry skin, but feels like the medicine may be causing her symptoms.   BP (!) 146/103   Pulse 97   Temp 98.6 F (37 C)   Ht 5\' 7"  (1.702 m)   Wt 181 lb 9.6 oz (82.4 kg)   SpO2 100% Comment: room air  BMI 28.44 kg/m   She has not taking her BP medicine. She needs to find a new MD after her insurance changed.   Wt Readings from Last 3 Encounters:  05/30/16 181 lb 9.6 oz (82.4 kg)  05/18/16 179 lb 6.4 oz (81.4 kg)  03/14/16 173 lb (78.5 kg)

## 2016-06-08 MED FILL — KISQALI 200 MG DAILY DOSE: 200 | 28 days supply | Qty: 63 | Fill #4

## 2016-06-15 ENCOUNTER — Ambulatory Visit (HOSPITAL_BASED_OUTPATIENT_CLINIC_OR_DEPARTMENT_OTHER): Payer: Medicaid Other | Admitting: Hematology and Oncology

## 2016-06-15 ENCOUNTER — Other Ambulatory Visit (HOSPITAL_BASED_OUTPATIENT_CLINIC_OR_DEPARTMENT_OTHER): Payer: Medicaid Other

## 2016-06-15 ENCOUNTER — Encounter: Payer: Self-pay | Admitting: *Deleted

## 2016-06-15 ENCOUNTER — Encounter: Payer: Self-pay | Admitting: Hematology and Oncology

## 2016-06-15 ENCOUNTER — Ambulatory Visit (HOSPITAL_BASED_OUTPATIENT_CLINIC_OR_DEPARTMENT_OTHER): Payer: Medicaid Other

## 2016-06-15 VITALS — BP 146/91 | HR 104 | Temp 98.0°F | Resp 16 | Ht 67.0 in | Wt 178.6 lb

## 2016-06-15 DIAGNOSIS — C50211 Malignant neoplasm of upper-inner quadrant of right female breast: Secondary | ICD-10-CM

## 2016-06-15 DIAGNOSIS — Z170 Estrogen receptor positive status [ER+]: Secondary | ICD-10-CM

## 2016-06-15 DIAGNOSIS — C7951 Secondary malignant neoplasm of bone: Secondary | ICD-10-CM

## 2016-06-15 DIAGNOSIS — C50911 Malignant neoplasm of unspecified site of right female breast: Secondary | ICD-10-CM

## 2016-06-15 DIAGNOSIS — Z5111 Encounter for antineoplastic chemotherapy: Secondary | ICD-10-CM | POA: Diagnosis not present

## 2016-06-15 DIAGNOSIS — D701 Agranulocytosis secondary to cancer chemotherapy: Secondary | ICD-10-CM

## 2016-06-15 DIAGNOSIS — C773 Secondary and unspecified malignant neoplasm of axilla and upper limb lymph nodes: Secondary | ICD-10-CM | POA: Diagnosis not present

## 2016-06-15 LAB — CBC WITH DIFFERENTIAL/PLATELET
BASO%: 1.6 % (ref 0.0–2.0)
Basophils Absolute: 0 10*3/uL (ref 0.0–0.1)
EOS%: 1.6 % (ref 0.0–7.0)
Eosinophils Absolute: 0 10*3/uL (ref 0.0–0.5)
HCT: 32.2 % — ABNORMAL LOW (ref 34.8–46.6)
HEMOGLOBIN: 10.3 g/dL — AB (ref 11.6–15.9)
LYMPH%: 33.1 % (ref 14.0–49.7)
MCH: 30.5 pg (ref 25.1–34.0)
MCHC: 32 g/dL (ref 31.5–36.0)
MCV: 95.3 fL (ref 79.5–101.0)
MONO#: 0.2 10*3/uL (ref 0.1–0.9)
MONO%: 14.2 % — AB (ref 0.0–14.0)
NEUT%: 49.5 % (ref 38.4–76.8)
NEUTROS ABS: 0.6 10*3/uL — AB (ref 1.5–6.5)
Platelets: 165 10*3/uL (ref 145–400)
RBC: 3.38 10*6/uL — AB (ref 3.70–5.45)
RDW: 14.6 % — AB (ref 11.2–14.5)
WBC: 1.3 10*3/uL — AB (ref 3.9–10.3)
lymph#: 0.4 10*3/uL — ABNORMAL LOW (ref 0.9–3.3)

## 2016-06-15 LAB — COMPREHENSIVE METABOLIC PANEL
ALT: 46 U/L (ref 0–55)
AST: 21 U/L (ref 5–34)
Albumin: 3.6 g/dL (ref 3.5–5.0)
Alkaline Phosphatase: 60 U/L (ref 40–150)
Anion Gap: 9 mEq/L (ref 3–11)
BILIRUBIN TOTAL: 0.26 mg/dL (ref 0.20–1.20)
BUN: 5.2 mg/dL — AB (ref 7.0–26.0)
CO2: 26 mEq/L (ref 22–29)
Calcium: 9.1 mg/dL (ref 8.4–10.4)
Chloride: 107 mEq/L (ref 98–109)
Creatinine: 0.7 mg/dL (ref 0.6–1.1)
GLUCOSE: 174 mg/dL — AB (ref 70–140)
Potassium: 3.8 mEq/L (ref 3.5–5.1)
SODIUM: 142 meq/L (ref 136–145)
TOTAL PROTEIN: 7.1 g/dL (ref 6.4–8.3)

## 2016-06-15 MED ORDER — GOSERELIN ACETATE 3.6 MG ~~LOC~~ IMPL
3.6000 mg | DRUG_IMPLANT | Freq: Once | SUBCUTANEOUS | Status: AC
Start: 2016-06-15 — End: 2016-06-15
  Administered 2016-06-15: 3.6 mg via SUBCUTANEOUS
  Filled 2016-06-15: qty 3.6

## 2016-06-15 MED ORDER — DENOSUMAB 120 MG/1.7ML ~~LOC~~ SOLN
120.0000 mg | Freq: Once | SUBCUTANEOUS | Status: AC
  Administered 2016-06-15: 120 mg via SUBCUTANEOUS
  Filled 2016-06-15: qty 1.7

## 2016-06-15 NOTE — Patient Instructions (Signed)
Goserelin injection What is this medicine? GOSERELIN (GOE se rel in) is similar to a hormone found in the body. It lowers the amount of sex hormones that the body makes. Men will have lower testosterone levels and women will have lower estrogen levels while taking this medicine. In men, this medicine is used to treat prostate cancer; the injection is either given once per month or once every 12 weeks. A once per month injection (only) is used to treat women with endometriosis, dysfunctional uterine bleeding, or advanced breast cancer. This medicine may be used for other purposes; ask your health care provider or pharmacist if you have questions. COMMON BRAND NAME(S): Zoladex What should I tell my health care provider before I take this medicine? They need to know if you have any of these conditions (some only apply to women): -diabetes -heart disease or previous heart attack -high blood pressure -high cholesterol -kidney disease -osteoporosis or low bone density -problems passing urine -spinal cord injury -stroke -tobacco smoker -an unusual or allergic reaction to goserelin, hormone therapy, other medicines, foods, dyes, or preservatives -pregnant or trying to get pregnant -breast-feeding How should I use this medicine? This medicine is for injection under the skin. It is given by a health care professional in a hospital or clinic setting. Men receive this injection once every 4 weeks or once every 12 weeks. Women will only receive the once every 4 weeks injection. Talk to your pediatrician regarding the use of this medicine in children. Special care may be needed. Overdosage: If you think you have taken too much of this medicine contact a poison control center or emergency room at once. NOTE: This medicine is only for you. Do not share this medicine with others. What if I miss a dose? It is important not to miss your dose. Call your doctor or health care professional if you are unable to  keep an appointment. What may interact with this medicine? -female hormones like estrogen -herbal or dietary supplements like black cohosh, chasteberry, or DHEA -female hormones like testosterone -prasterone This list may not describe all possible interactions. Give your health care provider a list of all the medicines, herbs, non-prescription drugs, or dietary supplements you use. Also tell them if you smoke, drink alcohol, or use illegal drugs. Some items may interact with your medicine. What should I watch for while using this medicine? Visit your doctor or health care professional for regular checks on your progress. Your symptoms may appear to get worse during the first weeks of this therapy. Tell your doctor or healthcare professional if your symptoms do not start to get better or if they get worse after this time. Your bones may get weaker if you take this medicine for a long time. If you smoke or frequently drink alcohol you may increase your risk of bone loss. A family history of osteoporosis, chronic use of drugs for seizures (convulsions), or corticosteroids can also increase your risk of bone loss. Talk to your doctor about how to keep your bones strong. This medicine should stop regular monthly menstration in women. Tell your doctor if you continue to menstrate. Women should not become pregnant while taking this medicine or for 12 weeks after stopping this medicine. Women should inform their doctor if they wish to become pregnant or think they might be pregnant. There is a potential for serious side effects to an unborn child. Talk to your health care professional or pharmacist for more information. Do not breast-feed an infant while taking   this medicine. Men should inform their doctors if they wish to father a child. This medicine may lower sperm counts. Talk to your health care professional or pharmacist for more information. What side effects may I notice from receiving this  medicine? Side effects that you should report to your doctor or health care professional as soon as possible: -allergic reactions like skin rash, itching or hives, swelling of the face, lips, or tongue -bone pain -breathing problems -changes in vision -chest pain -feeling faint or lightheaded, falls -fever, chills -pain, swelling, warmth in the leg -pain, tingling, numbness in the hands or feet -signs and symptoms of low blood pressure like dizziness; feeling faint or lightheaded, falls; unusually weak or tired -stomach pain -swelling of the ankles, feet, hands -trouble passing urine or change in the amount of urine -unusually high or low blood pressure -unusually weak or tired Side effects that usually do not require medical attention (report to your doctor or health care professional if they continue or are bothersome): -change in sex drive or performance -changes in breast size in both males and females -changes in emotions or moods -headache -hot flashes -irritation at site where injected -loss of appetite -skin problems like acne, dry skin -vaginal dryness This list may not describe all possible side effects. Call your doctor for medical advice about side effects. You may report side effects to FDA at 1-800-FDA-1088. Where should I keep my medicine? This drug is given in a hospital or clinic and will not be stored at home. NOTE: This sheet is a summary. It may not cover all possible information. If you have questions about this medicine, talk to your doctor, pharmacist, or health care provider.  2017 Elsevier/Gold Standard (2013-06-16 11:10:35) Denosumab injection What is this medicine? DENOSUMAB (den oh sue mab) slows bone breakdown. Prolia is used to treat osteoporosis in women after menopause and in men. Xgeva is used to prevent bone fractures and other bone problems caused by cancer bone metastases. Xgeva is also used to treat giant cell tumor of the bone. COMMON BRAND  NAME(S): Prolia, XGEVA What should I tell my health care provider before I take this medicine? They need to know if you have any of these conditions: -dental disease -eczema -infection or history of infections -kidney disease or on dialysis -low blood calcium or vitamin D -malabsorption syndrome -scheduled to have surgery or tooth extraction -taking medicine that contains denosumab -thyroid or parathyroid disease -an unusual reaction to denosumab, other medicines, foods, dyes, or preservatives -pregnant or trying to get pregnant -breast-feeding How should I use this medicine? This medicine is for injection under the skin. It is given by a health care professional in a hospital or clinic setting. If you are getting Prolia, a special MedGuide will be given to you by the pharmacist with each prescription and refill. Be sure to read this information carefully each time. For Prolia, talk to your pediatrician regarding the use of this medicine in children. Special care may be needed. For Xgeva, talk to your pediatrician regarding the use of this medicine in children. While this drug may be prescribed for children as young as 13 years for selected conditions, precautions do apply. What if I miss a dose? It is important not to miss your dose. Call your doctor or health care professional if you are unable to keep an appointment. What may interact with this medicine? Do not take this medicine with any of the following medications: -other medicines containing denosumab This medicine may also   interact with the following medications: -medicines that suppress the immune system -medicines that treat cancer -steroid medicines like prednisone or cortisone What should I watch for while using this medicine? Visit your doctor or health care professional for regular checks on your progress. Your doctor or health care professional may order blood tests and other tests to see how you are doing. Call your  doctor or health care professional if you get a cold or other infection while receiving this medicine. Do not treat yourself. This medicine may decrease your body's ability to fight infection. You should make sure you get enough calcium and vitamin D while you are taking this medicine, unless your doctor tells you not to. Discuss the foods you eat and the vitamins you take with your health care professional. See your dentist regularly. Brush and floss your teeth as directed. Before you have any dental work done, tell your dentist you are receiving this medicine. Do not become pregnant while taking this medicine or for 5 months after stopping it. Women should inform their doctor if they wish to become pregnant or think they might be pregnant. There is a potential for serious side effects to an unborn child. Talk to your health care professional or pharmacist for more information. What side effects may I notice from receiving this medicine? Side effects that you should report to your doctor or health care professional as soon as possible: -allergic reactions like skin rash, itching or hives, swelling of the face, lips, or tongue -breathing problems -chest pain -fast, irregular heartbeat -feeling faint or lightheaded, falls -fever, chills, or any other sign of infection -muscle spasms, tightening, or twitches -numbness or tingling -skin blisters or bumps, or is dry, peels, or red -slow healing or unexplained pain in the mouth or jaw -unusual bleeding or bruising Side effects that usually do not require medical attention (report to your doctor or health care professional if they continue or are bothersome): -muscle pain -stomach upset, gas Where should I keep my medicine? This medicine is only given in a clinic, doctor's office, or other health care setting and will not be stored at home.  2017 Elsevier/Gold Standard (2015-05-12 10:06:55)  

## 2016-06-15 NOTE — Progress Notes (Signed)
[place] Work  Clinical Social Work was referred by [employer] guide for assessment of psychosocial needs.  Clinical Social Worker contacted patient via phone to offer support and assess for needs.  CSW left contact info and described assistance/support available. CSW awaits return call and will follow accordingly.     Loren Racer, LCSW, OSW-C Clinical Social Worker [place]  [place] Phone: [phone] Fax: [phone]

## 2016-06-15 NOTE — Assessment & Plan Note (Signed)
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   Treatment summary:  1. Patient completed neoadjuvant dose dense Adriamycin and Cytoxan and weekly Taxol X 12 started 02/09/2014 and completed 07/01/14 2. Right double lumpectomy 2:00: IDC grade 3, 3.2 cm, high-grade DCIS, LV I present, PNI present,; 12:00: IDC grade 3; 0.8 cm, high-grade DCIS, LVI, 4/4 lymph nodes positive with ECE, ER 90%, PR 40%, HER-2 negative margin positive T2 N2 M0 stage IIIa 3. Patient is on [facility] clinical trial and she was randomized intraoperatively for NO lymph node dissection. 4.Tamoxifen 20 mg daily started 12/29/2014 stopped July 2016 for adverse effects 5. 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 Palliative XRT to the spine for cord compression --------------------------------------------------------------------------------------------------------------------------------------------------------- Current treatment: Ribociclibwith letrozole plus ovarian suppression Bone Mets: Xgeva with calcium and vitamin D Ribociclib toxicities:  1.Fatigue 2. Neutropenia ANC 1.2: Based on the guidelines we will keep the dosage the same  CT CAP 05/24/16: Bone metastases similar in volume with interval sclerosis, right upper lobe nodule no longer identified, no soft tissue metastases, L4 compression deformity  Radiology review: I discussed with her that the patient is responding to treatment. We will continue with the same. Goals of care were discussed: Palliation  Return to clinic in one month for toxicity check.

## 2016-06-15 NOTE — Progress Notes (Signed)
Patient Care Team: Provider Not In System as PCP - Knollwood III, MD as Consulting Physician (General Surgery) [name], MD as Consulting Physician (Hematology and Oncology) [name], MD as Attending Physician (Radiation Oncology) Benson Norway, RN as Registered Nurse (Oncology) Sylvan Cheese, NP as Nurse Practitioner (Hematology and Oncology)  DIAGNOSIS:  Encounter Diagnoses  Name Primary?  . Bone metastasis ([place]) Yes  . Malignant neoplasm of upper-inner quadrant of right breast in female, estrogen receptor positive ([place])     SUMMARY OF ONCOLOGIC HISTORY:   Breast cancer of upper-inner quadrant of right female breast ([place])   01/12/2014 Mammogram    Right breast - two lesions: #1 2:00 position 1.6 x 1.9 cm and #2 12:00 position 0.5 x 0.9 cm. Distance between the 2 was 4.5 cm, right axillary lymph node enlargement      01/12/2014 Initial Biopsy    Right breast 3 biopsies: All were IDC with DCIS ER+ PR + Ki-67 85% HER-2 negative: Right axillary lymph node also positive for metastatic cancer ER positive HER-2 negative Ki-67 80% grade 2      01/18/2014 Breast MRI    Right breast 12:00 position 2.8 x 2 x 2.2 cm: 2:00 position 1.3 x 0.8 x 0.5 cm, right axilla multiple enlarged lymph nodes 1 cm in size, right retropectoral lymph node 0.6 cm      01/18/2014 Clinical Stage    Stage IIB T2 N1      01/25/2014 Procedure    Breast/Ovarian ([facility]) reveals no clinically significant variant at ATM, BARD1, BRCA1, BRCA2, BRIP1, CDH1, CHEK2, EPCAM, FANCC, MLH1, MSH2, MSH6, NBN, PALB2, PMS2, PTEN, RAD51C, RAD51D, TP53, and XRCC2.       02/11/2014 -  Neo-Adjuvant Chemotherapy    Doxorubicin and cyclophosphamide X 4 followed by Taxol weekly x12       07/12/2014 Breast MRI    Partial response to neoadjuvant chemotherapy right breast mass decreased from 2.8 cm to 2.3 cm, 2 small masses along the medial margin also decreased; significant decrease right breast mass 11 to  12:00 and 13 mm to 8 mm, decrease in right axilla LN      08/23/2014 Definitive Surgery    Right lumpectomy/SLNB [name]) 2:00: IDC grade 3, 3.2 cm, high-grade DCIS, LV I present, PNI present,; 12:00: IDC grade 30.8 cm, high-grade DCIS, LV I, 4/4 lymph nodes positive with ECE, ER 90%, PR 40%, HER-2 negative margin positive      08/23/2014 Pathologic Stage    Stage IIIA: ypT2 ypN2a      09/15/2014 Surgery    Reexcision of margins: clear; no evidence of malignancy      11/03/2014 - 12/14/2014 Radiation Therapy    Adjuvant XRT Isidore Moos): Right Breast, supraclavicular nodes, axillary nodes, and internal mammary nodes: 50 Gy over 25 fractions; right breast boost: 10 Gy over 5 fractions. Total dose: 60 Gy      12/29/2014 -  Anti-estrogen oral therapy    Tamoxifen 20 mg daily      04/08/2015 Survivorship    Survivorship care plan completed and mailed to patient in lieu of in person visit at her request      12/31/2015 Relapse/Recurrence    Innumerable osseous metastases with acute pathologic fracture the L4 vertebral body. 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 L5-S1 protrusion with left more than right S1 impingement      01/05/2016 - 01/12/2016 Radiation Therapy    Palliative radiation to the spine for cord compression  01/13/2016 -  Anti-estrogen oral therapy    Ribociclib 3 weeks on and 1 week off and Letrozole (with Zolodex until oophorectomy) and Xgeva for bone mets       CHIEF COMPLIANT: Follow-up on Ribociclib with letrozole  INTERVAL HISTORY: Debbie Martinez is a 40-year-old with metastatic breast cancer currently on Ribociclib with letrozole. She is also on Zoladex and Xgeva. Recent scans and is here to discuss the results. She recently had respiratory infections that she got from her daughter. Her daughter was diagnosed with influenza type B.  REVIEW OF SYSTEMS:   Constitutional: Denies fevers, chills or abnormal weight loss Eyes: Denies  blurriness of vision Ears, nose, mouth, throat, and face: Denies mucositis or sore throat Respiratory: Denies cough, dyspnea or wheezes Cardiovascular: Denies palpitation, chest discomfort Gastrointestinal:  Denies nausea, heartburn or change in bowel habits Skin: Denies abnormal skin rashes Lymphatics: Denies new lymphadenopathy or easy bruising Neurological: Lower extremity weakness from compression fracture of the spine with cord compression. Behavioral/Psych: Mood is stable, no new changes  Extremities: No lower extremity edema Breast:  denies any pain or lumps or nodules in either breasts All other systems were reviewed with the patient and are negative.  I have reviewed the past medical history, past surgical history, social history and family history with the patient and they are unchanged from previous note.  ALLERGIES:  is allergic to lisinopril and tamoxifen.  MEDICATIONS:  Current Outpatient Prescriptions  Medication Sig Dispense Refill  . ACCU-CHEK AVIVA PLUS test strip USE AS DIRECTED 4 TIMES A DAY 100 each 0  . ACCU-CHEK SOFTCLIX LANCETS lancets 100 each by Other route 4 (four) times daily. Use as instructed 100 each 0  . atenolol (TENORMIN) 50 MG tablet Take 50 mg by mouth daily.    . blood glucose meter kit and supplies KIT Dispense based on patient and insurance preference. Use up to four times daily as directed. (FOR ICD-9 250.00, 250.01). 1 each 0  . Cholecalciferol (VITAMIN D-3 PO) Take 1 capsule by mouth daily.    . diazepam (VALIUM) 5 MG tablet Take 1 tablet (5 mg total) by mouth 2 (two) times daily. (Patient not taking: Reported on 03/14/2016) 10 tablet 0  . ENSURE (ENSURE) Take 237 mLs by mouth 2 (two) times daily between meals.     . gabapentin (NEURONTIN) 300 MG capsule Take 1 capsule (300 mg total) by mouth 3 (three) times daily. 90 capsule 5  . Iron-Vitamins (GERITOL) LIQD Take 5 mLs by mouth daily.    . letrozole (FEMARA) 2.5 MG tablet Take 1 tablet (2.5 mg  total) by mouth daily. 90 tablet 3  . Magnesium Salicylate (DOANS PILLS PO) Take 2 tablets by mouth daily as needed (for pain).    . metFORMIN (GLUCOPHAGE) 500 MG tablet Take 1 tablet (500 mg total) by mouth 2 (two) times daily with a meal. (Patient not taking: Reported on 05/30/2016) 60 tablet 0  . methocarbamol (ROBAXIN) 500 MG tablet Take 1 tablet (500 mg total) by mouth 2 (two) times daily as needed for muscle spasms. (Patient taking differently: Take 1,000 mg by mouth 4 (four) times daily as needed for muscle spasms. ) 20 tablet 0  . omeprazole (PRILOSEC) 20 MG capsule Take 1 capsule (20 mg total) by mouth daily. Take while using dexamethasone to prevent heartburn. 30 capsule 2  . oxyCODONE-acetaminophen (PERCOCET/ROXICET) 5-325 MG tablet Take 1 tablet by mouth every 6 (six) hours as needed for severe pain. 120 tablet 0  . polyethylene   glycol (MIRALAX / GLYCOLAX) packet Take 17 g by mouth daily. (Patient not taking: Reported on 05/30/2016) 14 each 0  . Ribociclib Succinate 200 MG TABS Take 600 mg by mouth daily. 3 weeks on 1 week off 63 tablet 6  . traMADol (ULTRAM) 50 MG tablet Take 50-100 mg by mouth every 6 (six) hours as needed (for pain).     No current facility-administered medications for this visit.    Facility-Administered Medications Ordered in Other Visits  Medication Dose Route Frequency Provider Last Rate Last Dose  . goserelin (ZOLADEX) injection 3.6 mg  3.6 mg Subcutaneous Q28 days [name], MD   3.6 mg at 01/27/16 1009  . goserelin (ZOLADEX) injection 3.6 mg  3.6 mg Subcutaneous Q28 days [name], MD   3.6 mg at 03/23/16 1122    PHYSICAL EXAMINATION: ECOG PERFORMANCE STATUS: 2 - Symptomatic, <50% confined to bed  Vitals:   06/15/16 0935  BP: (!) 146/91  Pulse: (!) 104  Resp: 16  Temp: 98 F (36.7 C)   Filed Weights   06/15/16 0935  Weight: 178 lb 9.6 oz (81 kg)    GENERAL:alert, no distress and comfortable SKIN: skin color, texture, turgor are normal, no  rashes or significant lesions EYES: normal, Conjunctiva are pink and non-injected, sclera clear OROPHARYNX:no exudate, no erythema and lips, buccal mucosa, and tongue normal  NECK: supple, thyroid normal size, non-tender, without nodularity LYMPH:  no palpable lymphadenopathy in the cervical, axillary or inguinal LUNGS: clear to auscultation and percussion with normal breathing effort HEART: regular rate & rhythm and no murmurs and no lower extremity edema ABDOMEN:abdomen soft, non-tender and normal bowel sounds MUSCULOSKELETAL:no cyanosis of digits and no clubbing  NEURO: Lower extremity weakness due to cord compression EXTREMITIES: No lower extremity edema   LABORATORY DATA:  I have reviewed the data as listed   Chemistry      Component Value Date/Time   NA 139 05/18/2016 1007   K 3.3 (L) 05/18/2016 1007   CL 98 (L) 01/04/2016 0504   CO2 25 05/18/2016 1007   BUN 10.6 05/18/2016 1007   CREATININE 0.7 05/18/2016 1007      Component Value Date/Time   CALCIUM 9.3 05/18/2016 1007   ALKPHOS 74 05/18/2016 1007   AST 18 05/18/2016 1007   ALT 28 05/18/2016 1007   BILITOT 0.27 05/18/2016 1007       Lab Results  Component Value Date   WBC 1.3 (L) 06/15/2016   HGB 10.3 (L) 06/15/2016   HCT 32.2 (L) 06/15/2016   MCV 95.3 06/15/2016   PLT 165 06/15/2016   NEUTROABS 0.6 (L) 06/15/2016    ASSESSMENT & PLAN:  Breast cancer of upper-inner quadrant of right female breast ([place]) 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   Treatment summary:  1. Patient completed neoadjuvant dose dense Adriamycin and Cytoxan and weekly Taxol X 12 started 02/09/2014 and completed 07/01/14 2. Right double lumpectomy 2:00: IDC grade 3, 3.2 cm, high-grade DCIS, LV I present, PNI present,; 12:00: IDC grade 3; 0.8 cm, high-grade DCIS, LVI, 4/4 lymph nodes positive with ECE,  ER 90%, PR 40%, HER-2 negative margin positive T2 N2 M0 stage IIIa 3. Patient is on [facility] clinical trial and she was randomized intraoperatively for NO lymph node dissection. 4.Tamoxifen 20 mg daily started 12/29/2014 stopped July 2016 for adverse effects 5. Relapsed/metastatic diseasediagnosed 12/31/2015 when she presented with cord compression  with innumerable bone metastases with L4 pathological fracture 7. Palliative XRT to the spine for cord compression --------------------------------------------------------------------------------------------------------------------------------------------------------- Current treatment: Ribociclibwith letrozole plus ovarian suppression Bone Mets: Xgeva with calcium and vitamin D Ribociclib toxicities:  1.Fatigue 2. Neutropenia ANC 0.6: I patient is supposed to start next cycle of Ribociclib on Monday. I discussed with her about starting it next Friday instead.  CT CAP 05/24/16: Bone metastases similar in volume with interval sclerosis, right upper lobe nodule no longer identified, no soft tissue metastases, L4 compression deformity  Radiology review: I discussed with her that the patient is responding to treatment. We will continue with the same. Goals of care were discussed: Palliation  Return to clinic in 5 weeks for toxicity check.  I spent 25 minutes talking to the patient of which more than half was spent in counseling and coordination of care.  No orders of the defined types were placed in this encounter.  The patient has a good understanding of the overall plan. she agrees with it. she will call with any problems that may develop before the next visit here.   Gudena, Vinay K, MD 06/15/16    

## 2016-06-20 ENCOUNTER — Telehealth: Payer: Self-pay | Admitting: Hematology and Oncology

## 2016-06-20 NOTE — Telephone Encounter (Signed)
Patient filled out Disability/FMLA Cover sheet but left forms for a Public relations account executive that required the patient to fill out and complete. There was nothing for Dr. Lindi Adie to fill out on the forms. Tried to call patient to advise of this but got no answer and voicemail box was full so I was not able to leave. I will return forms to the front desk registration for her to pick up

## 2016-06-28 ENCOUNTER — Encounter: Payer: Self-pay | Admitting: Radiation Therapy

## 2016-06-28 NOTE — Progress Notes (Signed)
Several attempts have been made to reach this patient and her mother in regards to getting her back in to see Dr. Saintclair Halsted. Dr. Windy Carina office has left messages with the mother without a reply, but is unable to leave a message for the patient because her voicemail box is full. The same is true for my attempts to reach the patient and her mother.   I will try again to send a letter requesting that she contact us so we can get her scheduled to see Dr. Saintclair Halsted to discuss options for her pain relief. This will go out today, 06/28/16.    Mont Dutton R.T.(R)(T) Special Procedures Navigator

## 2016-07-05 ENCOUNTER — Telehealth: Payer: Self-pay

## 2016-07-05 NOTE — Telephone Encounter (Signed)
Pt called about her forms being filled out. Discussed per 2/28 telephone message that these were social security admin hearing papers with nothing for [place] to fill out. She can pick them up at front desk. She said OK.

## 2016-07-06 MED FILL — LETROZOLE 2.5 MG TABLET: 2.5 | 30 days supply | Qty: 30 | Fill #4

## 2016-07-09 ENCOUNTER — Telehealth: Payer: Self-pay | Admitting: Medical Oncology

## 2016-07-09 NOTE — Telephone Encounter (Signed)
Requesting ? emla cream. It was hard to understand her voice mail . I called her back and told her I would send request for emla cream to [facility] and if this is not what she needed to call back.

## 2016-07-16 ENCOUNTER — Telehealth: Payer: Self-pay | Admitting: *Deleted

## 2016-07-16 ENCOUNTER — Telehealth: Payer: Self-pay | Admitting: Emergency Medicine

## 2016-07-16 MED ORDER — LIDOCAINE-PRILOCAINE (BULK) 2.5-2.5 % CREA: 5.0000 mL | TOPICAL_CREAM | 99 refills | Status: DC | PRN

## 2016-07-16 MED FILL — LIDOCAINE-PRILOCAINE CREAM: 2.5-2.5 | 10 days supply | Qty: 30 | Fill #0

## 2016-07-16 MED FILL — KISQALI 200 MG DAILY DOSE: 200 | 28 days supply | Qty: 63 | Fill #5

## 2016-07-16 NOTE — Telephone Encounter (Signed)
Appointment made for port flush and lab collection on 3/29. Called patient and left message advising patient of date and time of flush appointment.

## 2016-07-16 NOTE — Telephone Encounter (Signed)
"  I need a refill on the Emla Cream for my injections.  Also ned to schedule appointment for flush of port-a-cath.  Ports has not been flushed or used since I was in the hospital September 2017."  Refill sent for Emla Cream.  Will notify provider about port-a-cath for scheduling orders.  Return number [phone].

## 2016-07-18 MED FILL — OXYCODONE/APAP 5/325 MG TAB: 5-325 | 30 days supply | Qty: 90 | Fill #0

## 2016-07-18 MED FILL — GABAPENTIN 300 MG CAPSULE: 300 | 30 days supply | Qty: 60 | Fill #0

## 2016-07-19 ENCOUNTER — Ambulatory Visit (HOSPITAL_BASED_OUTPATIENT_CLINIC_OR_DEPARTMENT_OTHER): Payer: Medicaid Other

## 2016-07-19 ENCOUNTER — Encounter: Payer: Self-pay | Admitting: Hematology and Oncology

## 2016-07-19 ENCOUNTER — Ambulatory Visit (HOSPITAL_BASED_OUTPATIENT_CLINIC_OR_DEPARTMENT_OTHER): Payer: Medicaid Other | Admitting: Hematology and Oncology

## 2016-07-19 ENCOUNTER — Other Ambulatory Visit (HOSPITAL_BASED_OUTPATIENT_CLINIC_OR_DEPARTMENT_OTHER): Payer: Medicaid Other

## 2016-07-19 ENCOUNTER — Encounter: Payer: Self-pay | Admitting: *Deleted

## 2016-07-19 ENCOUNTER — Other Ambulatory Visit: Payer: Self-pay | Admitting: Hematology and Oncology

## 2016-07-19 ENCOUNTER — Other Ambulatory Visit: Payer: Self-pay

## 2016-07-19 DIAGNOSIS — C50911 Malignant neoplasm of unspecified site of right female breast: Secondary | ICD-10-CM

## 2016-07-19 DIAGNOSIS — C50211 Malignant neoplasm of upper-inner quadrant of right female breast: Secondary | ICD-10-CM

## 2016-07-19 DIAGNOSIS — Z452 Encounter for adjustment and management of vascular access device: Secondary | ICD-10-CM

## 2016-07-19 DIAGNOSIS — Z170 Estrogen receptor positive status [ER+]: Principal | ICD-10-CM

## 2016-07-19 DIAGNOSIS — Z5111 Encounter for antineoplastic chemotherapy: Secondary | ICD-10-CM

## 2016-07-19 DIAGNOSIS — C7951 Secondary malignant neoplasm of bone: Secondary | ICD-10-CM

## 2016-07-19 DIAGNOSIS — D701 Agranulocytosis secondary to cancer chemotherapy: Secondary | ICD-10-CM | POA: Diagnosis not present

## 2016-07-19 DIAGNOSIS — Z95828 Presence of other vascular implants and grafts: Secondary | ICD-10-CM

## 2016-07-19 LAB — COMPREHENSIVE METABOLIC PANEL
ALT: 33 U/L (ref 0–55)
ANION GAP: 10 meq/L (ref 3–11)
AST: 20 U/L (ref 5–34)
Albumin: 3.8 g/dL (ref 3.5–5.0)
Alkaline Phosphatase: 57 U/L (ref 40–150)
BUN: 9 mg/dL (ref 7.0–26.0)
CALCIUM: 8.8 mg/dL (ref 8.4–10.4)
CHLORIDE: 105 meq/L (ref 98–109)
CO2: 23 meq/L (ref 22–29)
CREATININE: 0.7 mg/dL (ref 0.6–1.1)
EGFR: >90 mL/min/{1.73_m2} (ref >90)
Glucose: 232 mg/dl — ABNORMAL HIGH (ref 70–140)
POTASSIUM: 3.3 meq/L — AB (ref 3.5–5.1)
Sodium: 138 mEq/L (ref 136–145)
Total Bilirubin: 0.29 mg/dL (ref 0.20–1.20)
Total Protein: 7.1 g/dL (ref 6.4–8.3)

## 2016-07-19 LAB — CBC WITH DIFFERENTIAL/PLATELET
BASO%: 0.4 % (ref 0.0–2.0)
BASOS ABS: 0 10*3/uL (ref 0.0–0.1)
EOS%: 1.5 % (ref 0.0–7.0)
Eosinophils Absolute: 0 10*3/uL (ref 0.0–0.5)
HEMATOCRIT: 33.2 % — AB (ref 34.8–46.6)
HGB: 11 g/dL — ABNORMAL LOW (ref 11.6–15.9)
LYMPH#: 0.4 10*3/uL — AB (ref 0.9–3.3)
LYMPH%: 15.5 % (ref 14.0–49.7)
MCH: 31.2 pg (ref 25.1–34.0)
MCHC: 33.1 g/dL (ref 31.5–36.0)
MCV: 94.3 fL (ref 79.5–101.0)
MONO#: 0.3 10*3/uL (ref 0.1–0.9)
MONO%: 12.6 % (ref 0.0–14.0)
NEUT#: 1.8 10*3/uL (ref 1.5–6.5)
NEUT%: 70 % (ref 38.4–76.8)
Platelets: 204 10*3/uL (ref 145–400)
RBC: 3.52 10*6/uL — AB (ref 3.70–5.45)
RDW: 16.4 % — ABNORMAL HIGH (ref 11.2–14.5)
WBC: 2.5 10*3/uL — ABNORMAL LOW (ref 3.9–10.3)

## 2016-07-19 MED ORDER — GOSERELIN ACETATE 3.6 MG ~~LOC~~ IMPL
3.6000 mg | DRUG_IMPLANT | Freq: Once | SUBCUTANEOUS | Status: AC
  Administered 2016-07-19: 3.6 mg via SUBCUTANEOUS
  Filled 2016-07-19: qty 3.6

## 2016-07-19 MED ORDER — HEPARIN SOD (PORK) LOCK FLUSH 100 UNIT/ML IV SOLN
500.0000 [IU] | Freq: Once | INTRAVENOUS | Status: AC
  Administered 2016-07-19: 500 [IU] via INTRAVENOUS
  Filled 2016-07-19: qty 5

## 2016-07-19 MED ORDER — DENOSUMAB 120 MG/1.7ML ~~LOC~~ SOLN
120.0000 mg | Freq: Once | SUBCUTANEOUS | Status: AC
  Administered 2016-07-19: 120 mg via SUBCUTANEOUS
  Filled 2016-07-19: qty 1.7

## 2016-07-19 MED ORDER — SODIUM CHLORIDE 0.9% FLUSH
10.0000 mL | INTRAVENOUS | Status: DC | PRN
  Administered 2016-07-19: 10 mL via INTRAVENOUS
  Filled 2016-07-19: qty 10

## 2016-07-19 NOTE — Patient Instructions (Signed)
Goserelin injection What is this medicine? GOSERELIN (GOE se rel in) is similar to a hormone found in the body. It lowers the amount of sex hormones that the body makes. Men will have lower testosterone levels and women will have lower estrogen levels while taking this medicine. In men, this medicine is used to treat prostate cancer; the injection is either given once per month or once every 12 weeks. A once per month injection (only) is used to treat women with endometriosis, dysfunctional uterine bleeding, or advanced breast cancer. This medicine may be used for other purposes; ask your health care provider or pharmacist if you have questions. COMMON BRAND NAME(S): Zoladex What should I tell my health care provider before I take this medicine? They need to know if you have any of these conditions (some only apply to women): -diabetes -heart disease or previous heart attack -high blood pressure -high cholesterol -kidney disease -osteoporosis or low bone density -problems passing urine -spinal cord injury -stroke -tobacco smoker -an unusual or allergic reaction to goserelin, hormone therapy, other medicines, foods, dyes, or preservatives -pregnant or trying to get pregnant -breast-feeding How should I use this medicine? This medicine is for injection under the skin. It is given by a health care professional in a hospital or clinic setting. Men receive this injection once every 4 weeks or once every 12 weeks. Women will only receive the once every 4 weeks injection. Talk to your pediatrician regarding the use of this medicine in children. Special care may be needed. Overdosage: If you think you have taken too much of this medicine contact a poison control center or emergency room at once. NOTE: This medicine is only for you. Do not share this medicine with others. What if I miss a dose? It is important not to miss your dose. Call your doctor or health care professional if you are unable to  keep an appointment. What may interact with this medicine? -female hormones like estrogen -herbal or dietary supplements like black cohosh, chasteberry, or DHEA -female hormones like testosterone -prasterone This list may not describe all possible interactions. Give your health care provider a list of all the medicines, herbs, non-prescription drugs, or dietary supplements you use. Also tell them if you smoke, drink alcohol, or use illegal drugs. Some items may interact with your medicine. What should I watch for while using this medicine? Visit your doctor or health care professional for regular checks on your progress. Your symptoms may appear to get worse during the first weeks of this therapy. Tell your doctor or healthcare professional if your symptoms do not start to get better or if they get worse after this time. Your bones may get weaker if you take this medicine for a long time. If you smoke or frequently drink alcohol you may increase your risk of bone loss. A family history of osteoporosis, chronic use of drugs for seizures (convulsions), or corticosteroids can also increase your risk of bone loss. Talk to your doctor about how to keep your bones strong. This medicine should stop regular monthly menstration in women. Tell your doctor if you continue to menstrate. Women should not become pregnant while taking this medicine or for 12 weeks after stopping this medicine. Women should inform their doctor if they wish to become pregnant or think they might be pregnant. There is a potential for serious side effects to an unborn child. Talk to your health care professional or pharmacist for more information. Do not breast-feed an infant while taking   this medicine. Men should inform their doctors if they wish to father a child. This medicine may lower sperm counts. Talk to your health care professional or pharmacist for more information. What side effects may I notice from receiving this  medicine? Side effects that you should report to your doctor or health care professional as soon as possible: -allergic reactions like skin rash, itching or hives, swelling of the face, lips, or tongue -bone pain -breathing problems -changes in vision -chest pain -feeling faint or lightheaded, falls -fever, chills -pain, swelling, warmth in the leg -pain, tingling, numbness in the hands or feet -signs and symptoms of low blood pressure like dizziness; feeling faint or lightheaded, falls; unusually weak or tired -stomach pain -swelling of the ankles, feet, hands -trouble passing urine or change in the amount of urine -unusually high or low blood pressure -unusually weak or tired Side effects that usually do not require medical attention (report to your doctor or health care professional if they continue or are bothersome): -change in sex drive or performance -changes in breast size in both males and females -changes in emotions or moods -headache -hot flashes -irritation at site where injected -loss of appetite -skin problems like acne, dry skin -vaginal dryness This list may not describe all possible side effects. Call your doctor for medical advice about side effects. You may report side effects to FDA at 1-800-FDA-1088. Where should I keep my medicine? This drug is given in a hospital or clinic and will not be stored at home. NOTE: This sheet is a summary. It may not cover all possible information. If you have questions about this medicine, talk to your doctor, pharmacist, or health care provider.  2017 Elsevier/Gold Standard (2013-06-16 11:10:35) Denosumab injection What is this medicine? DENOSUMAB (den oh sue mab) slows bone breakdown. Prolia is used to treat osteoporosis in women after menopause and in men. Delton See is used to prevent bone fractures and other bone problems caused by cancer bone metastases. Delton See is also used to treat giant cell tumor of the bone. COMMON BRAND  NAME(S): Prolia, XGEVA What should I tell my health care provider before I take this medicine? They need to know if you have any of these conditions: -dental disease -eczema -infection or history of infections -kidney disease or on dialysis -low blood calcium or vitamin D -malabsorption syndrome -scheduled to have surgery or tooth extraction -taking medicine that contains denosumab -thyroid or parathyroid disease -an unusual reaction to denosumab, other medicines, foods, dyes, or preservatives -pregnant or trying to get pregnant -breast-feeding How should I use this medicine? This medicine is for injection under the skin. It is given by a health care professional in a hospital or clinic setting. If you are getting Prolia, a special MedGuide will be given to you by the pharmacist with each prescription and refill. Be sure to read this information carefully each time. For Prolia, talk to your pediatrician regarding the use of this medicine in children. Special care may be needed. For Delton See, talk to your pediatrician regarding the use of this medicine in children. While this drug may be prescribed for children as young as 13 years for selected conditions, precautions do apply. What if I miss a dose? It is important not to miss your dose. Call your doctor or health care professional if you are unable to keep an appointment. What may interact with this medicine? Do not take this medicine with any of the following medications: -other medicines containing denosumab This medicine may also  interact with the following medications: -medicines that suppress the immune system -medicines that treat cancer -steroid medicines like prednisone or cortisone What should I watch for while using this medicine? Visit your doctor or health care professional for regular checks on your progress. Your doctor or health care professional may order blood tests and other tests to see how you are doing. Call your  doctor or health care professional if you get a cold or other infection while receiving this medicine. Do not treat yourself. This medicine may decrease your body's ability to fight infection. You should make sure you get enough calcium and vitamin D while you are taking this medicine, unless your doctor tells you not to. Discuss the foods you eat and the vitamins you take with your health care professional. See your dentist regularly. Brush and floss your teeth as directed. Before you have any dental work done, tell your dentist you are receiving this medicine. Do not become pregnant while taking this medicine or for 5 months after stopping it. Women should inform their doctor if they wish to become pregnant or think they might be pregnant. There is a potential for serious side effects to an unborn child. Talk to your health care professional or pharmacist for more information. What side effects may I notice from receiving this medicine? Side effects that you should report to your doctor or health care professional as soon as possible: -allergic reactions like skin rash, itching or hives, swelling of the face, lips, or tongue -breathing problems -chest pain -fast, irregular heartbeat -feeling faint or lightheaded, falls -fever, chills, or any other sign of infection -muscle spasms, tightening, or twitches -numbness or tingling -skin blisters or bumps, or is dry, peels, or red -slow healing or unexplained pain in the mouth or jaw -unusual bleeding or bruising Side effects that usually do not require medical attention (report to your doctor or health care professional if they continue or are bothersome): -muscle pain -stomach upset, gas Where should I keep my medicine? This medicine is only given in a clinic, doctor's office, or other health care setting and will not be stored at home.  2017 Elsevier/Gold Standard (2015-05-12 10:06:55)

## 2016-07-19 NOTE — Progress Notes (Signed)
Patient Care Team: Provider Not In System as PCP - [place] III, MD as Consulting Physician (General Surgery) [name], MD as Consulting Physician (Hematology and Oncology) [name], MD as Attending Physician (Radiation Oncology) Benson Norway, RN as Registered Nurse (Oncology) Sylvan Cheese, NP as Nurse Practitioner (Hematology and Oncology)  DIAGNOSIS:  Encounter Diagnosis  Name Primary?  . Malignant neoplasm of upper-inner quadrant of right breast in female, estrogen receptor positive ([name])     SUMMARY OF ONCOLOGIC HISTORY:   Breast cancer of upper-inner quadrant of right female breast ([name])   01/12/2014 Mammogram    Right breast - two lesions: #1 2:00 position 1.6 x 1.9 cm and #2 12:00 position 0.5 x 0.9 cm. Distance between the 2 was 4.5 cm, right axillary lymph node enlargement      01/12/2014 Initial Biopsy    Right breast 3 biopsies: All were IDC with DCIS ER+ PR + Ki-67 85% HER-2 negative: Right axillary lymph node also positive for metastatic cancer ER positive HER-2 negative Ki-67 80% grade 2      01/18/2014 Breast MRI    Right breast 12:00 position 2.8 x 2 x 2.2 cm: 2:00 position 1.3 x 0.8 x 0.5 cm, right axilla multiple enlarged lymph nodes 1 cm in size, right retropectoral lymph node 0.6 cm      01/18/2014 Clinical Stage    Stage IIB T2 N1      01/25/2014 Procedure    Breast/Ovarian ([facility]) reveals no clinically significant variant at ATM, BARD1, BRCA1, BRCA2, BRIP1, CDH1, CHEK2, EPCAM, FANCC, MLH1, MSH2, MSH6, NBN, PALB2, PMS2, PTEN, RAD51C, RAD51D, TP53, and XRCC2.       02/11/2014 -  Neo-Adjuvant Chemotherapy    Doxorubicin and cyclophosphamide X 4 followed by Taxol weekly x12       07/12/2014 Breast MRI    Partial response to neoadjuvant chemotherapy right breast mass decreased from 2.8 cm to 2.3 cm, 2 small masses along the medial margin also decreased; significant decrease right breast mass 11 to 12:00 and 13 mm to 8 mm,  decrease in right axilla LN      08/23/2014 Definitive Surgery    Right lumpectomy/SLNB [name]) 2:00: IDC grade 3, 3.2 cm, high-grade DCIS, LV I present, PNI present,; 12:00: IDC grade 30.8 cm, high-grade DCIS, LV I, 4/4 lymph nodes positive with ECE, ER 90%, PR 40%, HER-2 negative margin positive      08/23/2014 Pathologic Stage    Stage IIIA: ypT2 ypN2a      09/15/2014 Surgery    Reexcision of margins: clear; no evidence of malignancy      11/03/2014 - 12/14/2014 Radiation Therapy    Adjuvant XRT Isidore Moos): Right Breast, supraclavicular nodes, axillary nodes, and internal mammary nodes: 50 Gy over 25 fractions; right breast boost: 10 Gy over 5 fractions. Total dose: 60 Gy      12/29/2014 -  Anti-estrogen oral therapy    Tamoxifen 20 mg daily      04/08/2015 Survivorship    Survivorship care plan completed and mailed to patient in lieu of in person visit at her request      12/31/2015 Relapse/Recurrence    Innumerable osseous metastases with acute pathologic fracture the L4 vertebral body. 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 L5-S1 protrusion with left more than right S1 impingement      01/05/2016 - 01/12/2016 Radiation Therapy    Palliative radiation to the spine for cord compression      01/13/2016 -  Anti-estrogen oral therapy    Ribociclib 3 weeks on and 1 week off and Letrozole (with Zolodex until oophorectomy) and Xgeva for bone mets       CHIEF COMPLIANT: Follow-up on Ribociclib and letrozole with Zoladex  INTERVAL HISTORY: Debbie Martinez is a 41 year old with above-mentioned history metastatic breast cancer currently on Ribociclib with letrozole. She is on Zoladex for ovarian suppression.  REVIEW OF SYSTEMS:   Constitutional: Denies fevers, chills or abnormal weight loss Eyes: Denies blurriness of vision Ears, nose, mouth, throat, and face: Denies mucositis or sore throat Respiratory: Denies cough, dyspnea or wheezes Cardiovascular:  Denies palpitation, chest discomfort Gastrointestinal:  Denies nausea, heartburn or change in bowel habits Skin: Denies abnormal skin rashes Lymphatics: Denies new lymphadenopathy or easy bruising Neurological:Denies numbness, tingling or new weaknesses Behavioral/Psych: Mood is stable, no new changes  Extremities: No lower extremity edema  All other systems were reviewed with the patient and are negative.  I have reviewed the past medical history, past surgical history, social history and family history with the patient and they are unchanged from previous note.  ALLERGIES:  is allergic to lisinopril and tamoxifen.  MEDICATIONS:  Current Outpatient Prescriptions  Medication Sig Dispense Refill  . ACCU-CHEK AVIVA PLUS test strip USE AS DIRECTED 4 TIMES A DAY 100 each 0  . ACCU-CHEK SOFTCLIX LANCETS lancets 100 each by Other route 4 (four) times daily. Use as instructed 100 each 0  . atenolol (TENORMIN) 50 MG tablet Take 50 mg by mouth daily.    . blood glucose meter kit and supplies KIT Dispense based on patient and insurance preference. Use up to four times daily as directed. (FOR ICD-9 250.00, 250.01). 1 each 0  . Cholecalciferol (VITAMIN D-3 PO) Take 1 capsule by mouth daily.    . diazepam (VALIUM) 5 MG tablet Take 1 tablet (5 mg total) by mouth 2 (two) times daily. (Patient not taking: Reported on 03/14/2016) 10 tablet 0  . ENSURE (ENSURE) Take 237 mLs by mouth 2 (two) times daily between meals.     . gabapentin (NEURONTIN) 300 MG capsule Take 1 capsule (300 mg total) by mouth 3 (three) times daily. 90 capsule 5  . Iron-Vitamins (GERITOL) LIQD Take 5 mLs by mouth daily.    Marland Kitchen letrozole (FEMARA) 2.5 MG tablet Take 1 tablet (2.5 mg total) by mouth daily. 90 tablet 3  . Lidocaine-Prilocaine, Bulk, 2.5-2.5 % CREA Apply 5 mLs topically as needed. 2 hours before use, cover with plawtic wrap. 30 g prn  . Magnesium Salicylate (DOANS PILLS PO) Take 2 tablets by mouth daily as needed (for pain).     . metFORMIN (GLUCOPHAGE) 500 MG tablet Take 1 tablet (500 mg total) by mouth 2 (two) times daily with a meal. (Patient not taking: Reported on 05/30/2016) 60 tablet 0  . methocarbamol (ROBAXIN) 500 MG tablet Take 1 tablet (500 mg total) by mouth 2 (two) times daily as needed for muscle spasms. (Patient taking differently: Take 1,000 mg by mouth 4 (four) times daily as needed for muscle spasms. ) 20 tablet 0  . omeprazole (PRILOSEC) 20 MG capsule Take 1 capsule (20 mg total) by mouth daily. Take while using dexamethasone to prevent heartburn. 30 capsule 2  . oxyCODONE-acetaminophen (PERCOCET/ROXICET) 5-325 MG tablet Take 1 tablet by mouth every 6 (six) hours as needed for severe pain. 120 tablet 0  . polyethylene glycol (MIRALAX / GLYCOLAX) packet Take 17 g by mouth daily. (Patient not taking: Reported on 05/30/2016) 14 each 0  .  Ribociclib Succinate 200 MG TABS Take 600 mg by mouth daily. 3 weeks on 1 week off 63 tablet 6  . traMADol (ULTRAM) 50 MG tablet Take 50-100 mg by mouth every 6 (six) hours as needed (for pain).     No current facility-administered medications for this visit.    Facility-Administered Medications Ordered in Other Visits  Medication Dose Route Frequency Provider Last Rate Last Dose  . goserelin (ZOLADEX) injection 3.6 mg  3.6 mg Subcutaneous Q28 days [name], MD   3.6 mg at 01/27/16 1009  . goserelin (ZOLADEX) injection 3.6 mg  3.6 mg Subcutaneous Q28 days [name], MD   3.6 mg at 03/23/16 1122    PHYSICAL EXAMINATION: ECOG PERFORMANCE STATUS: 1 - Symptomatic but completely ambulatory  Vitals:   07/19/16 1002  BP: (!) 144/90  Pulse: (!) 103  Resp: 18  Temp: 98.1 F (36.7 C)   Filed Weights   07/19/16 1002  Weight: 178 lb 11.2 oz (81.1 kg)    GENERAL:alert, no distress and comfortable SKIN: skin color, texture, turgor are normal, no rashes or significant lesions EYES: normal, Conjunctiva are pink and non-injected, sclera clear OROPHARYNX:no exudate,  no erythema and lips, buccal mucosa, and tongue normal  NECK: supple, thyroid normal size, non-tender, without nodularity LYMPH:  no palpable lymphadenopathy in the cervical, axillary or inguinal LUNGS: clear to auscultation and percussion with normal breathing effort HEART: regular rate & rhythm and no murmurs and no lower extremity edema ABDOMEN:abdomen soft, non-tender and normal bowel sounds MUSCULOSKELETAL:no cyanosis of digits and no clubbing  NEURO: alert & oriented x 3 with fluent speech, no focal motor/sensory deficits EXTREMITIES: No lower extremity edema  LABORATORY DATA:  I have reviewed the data as listed   Chemistry      Component Value Date/Time   NA 142 06/15/2016 0919   K 3.8 06/15/2016 0919   CL 98 (L) 01/04/2016 0504   CO2 26 06/15/2016 0919   BUN 5.2 (L) 06/15/2016 0919   CREATININE 0.7 06/15/2016 0919      Component Value Date/Time   CALCIUM 9.1 06/15/2016 0919   ALKPHOS 60 06/15/2016 0919   AST 21 06/15/2016 0919   ALT 46 06/15/2016 0919   BILITOT 0.26 06/15/2016 0919       Lab Results  Component Value Date   WBC 2.5 (L) 07/19/2016   HGB 11.0 (L) 07/19/2016   HCT 33.2 (L) 07/19/2016   MCV 94.3 07/19/2016   PLT 204 07/19/2016   NEUTROABS 1.8 07/19/2016    ASSESSMENT & PLAN:  Breast cancer of upper-inner quadrant of right female breast ([facility]) 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   Treatment summary:  1. Patient completed neoadjuvant dose dense Adriamycin and Cytoxan and weekly Taxol X 12 started 02/09/2014 and completed 07/01/14 2. Right double lumpectomy 2:00: IDC grade 3, 3.2 cm, high-grade DCIS, LV I present, PNI present,; 12:00: IDC grade 3; 0.8 cm, high-grade DCIS, LVI, 4/4 lymph nodes positive with ECE, ER 90%, PR 40%, HER-2 negative margin positive T2 N2 M0 stage IIIa 3. Patient is on [facility] clinical  trial and she was randomized intraoperatively for NO lymph node dissection. 4.Tamoxifen 20 mg daily started 12/29/2014 stopped July 2016 for adverse effects 5. 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 Palliative XRT to the spine for cord compression --------------------------------------------------------------------------------------------------------------------------------------------------------- Current treatment:  Ribociclibwith letrozole plus ovarian suppression Bone Mets: Xgeva with calcium and vitamin D Ribociclib toxicities:  1.Fatigue 2. Neutropenia ANC  Today is 1.8 continue with the same dosage  CT CAP 05/24/16: Bone metastases similar in volume with interval sclerosis, right upper lobe nodule no longer identified, no soft tissue metastases, L4 compression deformity  Goals of care were discussed: Palliation  Return to clinic every 4 weeks for Zoladex injections and every 8 weeks for follow-up with me.   I spent 25 minutes talking to the patient of which more than half was spent in counseling and coordination of care.  Orders Placed This Encounter  Procedures  . CBC with Differential    Standing Status:   Future    Standing Expiration Date:   07/19/2017  . Comprehensive metabolic panel    Standing Status:   Future    Standing Expiration Date:   07/19/2017   The patient has a good understanding of the overall plan. she agrees with it. she will call with any problems that may develop before the next visit here.   Rulon Eisenmenger, MD 07/19/16

## 2016-07-19 NOTE — Assessment & Plan Note (Signed)
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   Treatment summary:  1. Patient completed neoadjuvant dose dense Adriamycin and Cytoxan and weekly Taxol X 12 started 02/09/2014 and completed 07/01/14 2. Right double lumpectomy 2:00: IDC grade 3, 3.2 cm, high-grade DCIS, LV I present, PNI present,; 12:00: IDC grade 3; 0.8 cm, high-grade DCIS, LVI, 4/4 lymph nodes positive with ECE, ER 90%, PR 40%, HER-2 negative margin positive T2 N2 M0 stage IIIa 3. Patient is on [facility] clinical trial and she was randomized intraoperatively for NO lymph node dissection. 4.Tamoxifen 20 mg daily started 12/29/2014 stopped July 2016 for adverse effects 5. 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 Palliative XRT to the spine for cord compression --------------------------------------------------------------------------------------------------------------------------------------------------------- Current treatment: Ribociclibwith letrozole plus ovarian suppression Bone Mets: Xgeva with calcium and vitamin D Ribociclib toxicities:  1.Fatigue 2. Neutropenia ANC 0.6.  CT CAP 05/24/16: Bone metastases similar in volume with interval sclerosis, right upper lobe nodule no longer identified, no soft tissue metastases, L4 compression deformity  Goals of care were discussed: Palliation  Return to clinic in 5 weeks for toxicity check.

## 2016-07-19 NOTE — Patient Instructions (Signed)
Implanted Port Home Guide An implanted port is a type of central line that is placed under the skin. Central lines are used to provide IV access when treatment or nutrition needs to be given through a person's veins. Implanted ports are used for long-term IV access. An implanted port may be placed because:  You need IV medicine that would be irritating to the small veins in your hands or arms.  You need long-term IV medicines, such as antibiotics.  You need IV nutrition for a long period.  You need frequent blood draws for lab tests.  You need dialysis.  Implanted ports are usually placed in the chest area, but they can also be placed in the upper arm, the abdomen, or the leg. An implanted port has two main parts:  Reservoir. The reservoir is round and will appear as a small, raised area under your skin. The reservoir is the part where a needle is inserted to give medicines or draw blood.  Catheter. The catheter is a thin, flexible tube that extends from the reservoir. The catheter is placed into a large vein. Medicine that is inserted into the reservoir goes into the catheter and then into the vein.  How will I care for my incision site? Do not get the incision site wet. Bathe or shower as directed by your health care provider. How is my port accessed? Special steps must be taken to access the port:  Before the port is accessed, a numbing cream can be placed on the skin. This helps numb the skin over the port site.  Your health care provider uses a sterile technique to access the port. ? Your health care provider must put on a mask and sterile gloves. ? The skin over your port is cleaned carefully with an antiseptic and allowed to dry. ? The port is gently pinched between sterile gloves, and a needle is inserted into the port.  Only "non-coring" port needles should be used to access the port. Once the port is accessed, a blood return should be checked. This helps ensure that the port  is in the vein and is not clogged.  If your port needs to remain accessed for a constant infusion, a clear (transparent) bandage will be placed over the needle site. The bandage and needle will need to be changed every week, or as directed by your health care provider.  Keep the bandage covering the needle clean and dry. Do not get it wet. Follow your health care provider's instructions on how to take a shower or bath while the port is accessed.  If your port does not need to stay accessed, no bandage is needed over the port.  What is flushing? Flushing helps keep the port from getting clogged. Follow your health care provider's instructions on how and when to flush the port. Ports are usually flushed with saline solution or a medicine called heparin. The need for flushing will depend on how the port is used.  If the port is used for intermittent medicines or blood draws, the port will need to be flushed: ? After medicines have been given. ? After blood has been drawn. ? As part of routine maintenance.  If a constant infusion is running, the port may not need to be flushed.  How long will my port stay implanted? The port can stay in for as long as your health care provider thinks it is needed. When it is time for the port to come out, surgery will be   done to remove it. The procedure is similar to the one performed when the port was put in. When should I seek immediate medical care? When you have an implanted port, you should seek immediate medical care if:  You notice a bad smell coming from the incision site.  You have swelling, redness, or drainage at the incision site.  You have more swelling or pain at the port site or the surrounding area.  You have a fever that is not controlled with medicine.  This information is not intended to replace advice given to you by your health care provider. Make sure you discuss any questions you have with your health care provider. Document  Released: 04/09/2005 Document Revised: 09/15/2015 Document Reviewed: 12/15/2012 Elsevier Interactive Patient Education  2017 [employer]  

## 2016-08-14 ENCOUNTER — Other Ambulatory Visit: Payer: Self-pay

## 2016-08-14 DIAGNOSIS — Z170 Estrogen receptor positive status [ER+]: Principal | ICD-10-CM

## 2016-08-14 DIAGNOSIS — C50211 Malignant neoplasm of upper-inner quadrant of right female breast: Secondary | ICD-10-CM

## 2016-08-14 MED FILL — LETROZOLE 2.5 MG TABLET: 2.5 | 30 days supply | Qty: 30 | Fill #5

## 2016-08-14 MED FILL — KISQALI 200 MG DAILY DOSE: 200 | 28 days supply | Qty: 63 | Fill #6

## 2016-08-15 ENCOUNTER — Telehealth: Payer: Self-pay | Admitting: Hematology and Oncology

## 2016-08-15 NOTE — Telephone Encounter (Signed)
Called patient to inform her of added lab appointment for injection at 845 on 4.26. Patient aware.

## 2016-08-16 ENCOUNTER — Other Ambulatory Visit (HOSPITAL_BASED_OUTPATIENT_CLINIC_OR_DEPARTMENT_OTHER): Payer: Medicaid Other

## 2016-08-16 ENCOUNTER — Ambulatory Visit (HOSPITAL_BASED_OUTPATIENT_CLINIC_OR_DEPARTMENT_OTHER): Payer: Medicare Other

## 2016-08-16 VITALS — BP 146/95 | HR 94 | Temp 98.2°F | Resp 18

## 2016-08-16 DIAGNOSIS — C7951 Secondary malignant neoplasm of bone: Secondary | ICD-10-CM | POA: Diagnosis not present

## 2016-08-16 DIAGNOSIS — Z5111 Encounter for antineoplastic chemotherapy: Secondary | ICD-10-CM | POA: Diagnosis not present

## 2016-08-16 DIAGNOSIS — Z170 Estrogen receptor positive status [ER+]: Principal | ICD-10-CM

## 2016-08-16 DIAGNOSIS — C50211 Malignant neoplasm of upper-inner quadrant of right female breast: Secondary | ICD-10-CM | POA: Diagnosis not present

## 2016-08-16 DIAGNOSIS — C50911 Malignant neoplasm of unspecified site of right female breast: Secondary | ICD-10-CM

## 2016-08-16 LAB — COMPREHENSIVE METABOLIC PANEL
ALT: 38 U/L (ref 0–55)
AST: 27 U/L (ref 5–34)
Albumin: 4 g/dL (ref 3.5–5.0)
Alkaline Phosphatase: 55 U/L (ref 40–150)
Anion Gap: 11 mEq/L (ref 3–11)
BUN: 12.3 mg/dL (ref 7.0–26.0)
CALCIUM: 9.7 mg/dL (ref 8.4–10.4)
CHLORIDE: 105 meq/L (ref 98–109)
CO2: 25 mEq/L (ref 22–29)
Creatinine: 0.8 mg/dL (ref 0.6–1.1)
EGFR: >90 mL/min/{1.73_m2} (ref >90)
GLUCOSE: 247 mg/dL — AB (ref 70–140)
Potassium: 3.6 mEq/L (ref 3.5–5.1)
SODIUM: 142 meq/L (ref 136–145)
Total Bilirubin: 0.45 mg/dL (ref 0.20–1.20)
Total Protein: 7.3 g/dL (ref 6.4–8.3)

## 2016-08-16 LAB — CBC WITH DIFFERENTIAL/PLATELET
BASO%: 0.6 % (ref 0.0–2.0)
BASOS ABS: 0 10*3/uL (ref 0.0–0.1)
EOS ABS: 0 10*3/uL (ref 0.0–0.5)
EOS%: 1.5 % (ref 0.0–7.0)
HCT: 34.5 % — ABNORMAL LOW (ref 34.8–46.6)
HEMOGLOBIN: 11.3 g/dL — AB (ref 11.6–15.9)
LYMPH%: 22.9 % (ref 14.0–49.7)
MCH: 31 pg (ref 25.1–34.0)
MCHC: 32.8 g/dL (ref 31.5–36.0)
MCV: 94.7 fL (ref 79.5–101.0)
MONO#: 0.1 10*3/uL (ref 0.1–0.9)
MONO%: 9 % (ref 0.0–14.0)
NEUT#: 1.1 10*3/uL — ABNORMAL LOW (ref 1.5–6.5)
NEUT%: 66 % (ref 38.4–76.8)
PLATELETS: 197 10*3/uL (ref 145–400)
RBC: 3.64 10*6/uL — AB (ref 3.70–5.45)
RDW: 16.4 % — ABNORMAL HIGH (ref 11.2–14.5)
WBC: 1.6 10*3/uL — ABNORMAL LOW (ref 3.9–10.3)
lymph#: 0.4 10*3/uL — ABNORMAL LOW (ref 0.9–3.3)

## 2016-08-16 MED ORDER — DENOSUMAB 120 MG/1.7ML ~~LOC~~ SOLN
120.0000 mg | Freq: Once | SUBCUTANEOUS | Status: AC
Start: 2016-08-16 — End: 2016-08-16
  Administered 2016-08-16: 120 mg via SUBCUTANEOUS
  Filled 2016-08-16: qty 1.7

## 2016-08-16 MED ORDER — GOSERELIN ACETATE 3.6 MG ~~LOC~~ IMPL
3.6000 mg | DRUG_IMPLANT | Freq: Once | SUBCUTANEOUS | Status: AC
  Administered 2016-08-16: 3.6 mg via SUBCUTANEOUS
  Filled 2016-08-16: qty 3.6

## 2016-09-10 ENCOUNTER — Other Ambulatory Visit: Payer: Self-pay | Admitting: Hematology and Oncology

## 2016-09-10 MED FILL — LETROZOLE 2.5 MG TABLET: 2.5 | 30 days supply | Qty: 30 | Fill #6

## 2016-09-10 MED FILL — KISQALI 200 MG DAILY DOSE: 200 | 28 days supply | Qty: 63 | Fill #0

## 2016-09-12 NOTE — Assessment & Plan Note (Signed)
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   Treatment summary:  1. Patient completed neoadjuvant dose dense Adriamycin and Cytoxan and weekly Taxol X 12 started 02/09/2014 and completed 07/01/14 2. Right double lumpectomy 2:00: IDC grade 3, 3.2 cm, high-grade DCIS, LV I present, PNI present,; 12:00: IDC grade 3; 0.8 cm, high-grade DCIS, LVI, 4/4 lymph nodes positive with ECE, ER 90%, PR 40%, HER-2 negative margin positive T2 N2 M0 stage IIIa 3. Patient is on [facility] clinical trial and she was randomized intraoperatively for NO lymph node dissection. 4.Tamoxifen 20 mg daily started 12/29/2014 stopped July 2016 for adverse effects 5. 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 Palliative XRT to the spine for cord compression --------------------------------------------------------------------------------------------------------------------------------------------------------- Current treatment: Ribociclibwith letrozole plus ovarian suppression Bone Mets: Xgeva with calcium and vitamin D Ribociclib toxicities:  1.Fatigue 2. Neutropenia ANC  Today is 1.8 continue with the same dosage

## 2016-09-13 ENCOUNTER — Other Ambulatory Visit (HOSPITAL_BASED_OUTPATIENT_CLINIC_OR_DEPARTMENT_OTHER): Payer: Medicaid Other

## 2016-09-13 ENCOUNTER — Ambulatory Visit (HOSPITAL_BASED_OUTPATIENT_CLINIC_OR_DEPARTMENT_OTHER): Payer: Medicare Other

## 2016-09-13 ENCOUNTER — Encounter: Payer: Self-pay | Admitting: Hematology and Oncology

## 2016-09-13 ENCOUNTER — Ambulatory Visit (HOSPITAL_BASED_OUTPATIENT_CLINIC_OR_DEPARTMENT_OTHER): Payer: Medicare Other | Admitting: Hematology and Oncology

## 2016-09-13 DIAGNOSIS — C773 Secondary and unspecified malignant neoplasm of axilla and upper limb lymph nodes: Secondary | ICD-10-CM | POA: Diagnosis not present

## 2016-09-13 DIAGNOSIS — F17210 Nicotine dependence, cigarettes, uncomplicated: Secondary | ICD-10-CM | POA: Diagnosis present

## 2016-09-13 DIAGNOSIS — Z170 Estrogen receptor positive status [ER+]: Principal | ICD-10-CM

## 2016-09-13 DIAGNOSIS — C7951 Secondary malignant neoplasm of bone: Secondary | ICD-10-CM | POA: Diagnosis not present

## 2016-09-13 DIAGNOSIS — C50211 Malignant neoplasm of upper-inner quadrant of right female breast: Secondary | ICD-10-CM

## 2016-09-13 DIAGNOSIS — C50911 Malignant neoplasm of unspecified site of right female breast: Secondary | ICD-10-CM

## 2016-09-13 DIAGNOSIS — D701 Agranulocytosis secondary to cancer chemotherapy: Secondary | ICD-10-CM | POA: Diagnosis not present

## 2016-09-13 LAB — COMPREHENSIVE METABOLIC PANEL
ALT: 30 U/L (ref 0–55)
ANION GAP: 7 meq/L (ref 3–11)
AST: 24 U/L (ref 5–34)
Albumin: 3.9 g/dL (ref 3.5–5.0)
Alkaline Phosphatase: 51 U/L (ref 40–150)
BILIRUBIN TOTAL: 0.37 mg/dL (ref 0.20–1.20)
BUN: 7.9 mg/dL (ref 7.0–26.0)
CHLORIDE: 105 meq/L (ref 98–109)
CO2: 26 meq/L (ref 22–29)
Calcium: 9.1 mg/dL (ref 8.4–10.4)
Creatinine: 0.8 mg/dL (ref 0.6–1.1)
Glucose: 235 mg/dl — ABNORMAL HIGH (ref 70–140)
POTASSIUM: 3.8 meq/L (ref 3.5–5.1)
Sodium: 138 mEq/L (ref 136–145)
Total Protein: 7.2 g/dL (ref 6.4–8.3)

## 2016-09-13 LAB — CBC WITH DIFFERENTIAL/PLATELET
BASO%: 1.3 % (ref 0.0–2.0)
Basophils Absolute: 0 10*3/uL (ref 0.0–0.1)
EOS ABS: 0 10*3/uL (ref 0.0–0.5)
EOS%: 1.5 % (ref 0.0–7.0)
HCT: 34.7 % — ABNORMAL LOW (ref 34.8–46.6)
HEMOGLOBIN: 11.3 g/dL — AB (ref 11.6–15.9)
LYMPH%: 24.9 % (ref 14.0–49.7)
MCH: 31.1 pg (ref 25.1–34.0)
MCHC: 32.5 g/dL (ref 31.5–36.0)
MCV: 95.7 fL (ref 79.5–101.0)
MONO#: 0.2 10*3/uL (ref 0.1–0.9)
MONO%: 11.1 % (ref 0.0–14.0)
NEUT#: 0.9 10*3/uL — ABNORMAL LOW (ref 1.5–6.5)
NEUT%: 61.2 % (ref 38.4–76.8)
Platelets: 192 10*3/uL (ref 145–400)
RBC: 3.63 10*6/uL — ABNORMAL LOW (ref 3.70–5.45)
RDW: 16 % — ABNORMAL HIGH (ref 11.2–14.5)
WBC: 1.5 10*3/uL — ABNORMAL LOW (ref 3.9–10.3)
lymph#: 0.4 10*3/uL — ABNORMAL LOW (ref 0.9–3.3)

## 2016-09-13 MED ORDER — DENOSUMAB 120 MG/1.7ML ~~LOC~~ SOLN
120.0000 mg | Freq: Once | SUBCUTANEOUS | Status: AC
  Administered 2016-09-13: 120 mg via SUBCUTANEOUS
  Filled 2016-09-13: qty 1.7

## 2016-09-13 MED ORDER — GOSERELIN ACETATE 3.6 MG ~~LOC~~ IMPL
3.6000 mg | DRUG_IMPLANT | Freq: Once | SUBCUTANEOUS | Status: DC
  Filled 2016-09-13: qty 3.6

## 2016-09-13 NOTE — Patient Instructions (Addendum)
Denosumab injection What is this medicine? DENOSUMAB (den oh sue mab) slows bone breakdown. Prolia is used to treat osteoporosis in women after menopause and in men. Delton See is used to treat a high calcium level due to cancer and to prevent bone fractures and other bone problems caused by multiple myeloma or cancer bone metastases. Delton See is also used to treat giant cell tumor of the bone. This medicine may be used for other purposes; ask your health care provider or pharmacist if you have questions. COMMON BRAND NAME(S): Prolia, XGEVA What should I tell my health care provider before I take this medicine? They need to know if you have any of these conditions: -dental disease -having surgery or tooth extraction -infection -kidney disease -low levels of calcium or Vitamin D in the blood -malnutrition -on hemodialysis -skin conditions or sensitivity -thyroid or parathyroid disease -an unusual reaction to denosumab, other medicines, foods, dyes, or preservatives -pregnant or trying to get pregnant -breast-feeding How should I use this medicine? This medicine is for injection under the skin. It is given by a health care professional in a hospital or clinic setting. If you are getting Prolia, a special MedGuide will be given to you by the pharmacist with each prescription and refill. Be sure to read this information carefully each time. For Prolia, talk to your pediatrician regarding the use of this medicine in children. Special care may be needed. For Delton See, talk to your pediatrician regarding the use of this medicine in children. While this drug may be prescribed for children as young as 13 years for selected conditions, precautions do apply. Overdosage: If you think you have taken too much of this medicine contact a poison control center or emergency room at once. NOTE: This medicine is only for you. Do not share this medicine with others. What if I miss a dose? It is important not to miss your  dose. Call your doctor or health care professional if you are unable to keep an appointment. What may interact with this medicine? Do not take this medicine with any of the following medications: -other medicines containing denosumab This medicine may also interact with the following medications: -medicines that lower your chance of fighting infection -steroid medicines like prednisone or cortisone This list may not describe all possible interactions. Give your health care provider a list of all the medicines, herbs, non-prescription drugs, or dietary supplements you use. Also tell them if you smoke, drink alcohol, or use illegal drugs. Some items may interact with your medicine. What should I watch for while using this medicine? Visit your doctor or health care professional for regular checks on your progress. Your doctor or health care professional may order blood tests and other tests to see how you are doing. Call your doctor or health care professional for advice if you get a fever, chills or sore throat, or other symptoms of a cold or flu. Do not treat yourself. This drug may decrease your body's ability to fight infection. Try to avoid being around people who are sick. You should make sure you get enough calcium and vitamin D while you are taking this medicine, unless your doctor tells you not to. Discuss the foods you eat and the vitamins you take with your health care professional. See your dentist regularly. Brush and floss your teeth as directed. Before you have any dental work done, tell your dentist you are receiving this medicine. Do not become pregnant while taking this medicine or for 5 months after stopping  it. Talk with your doctor or health care professional about your birth control options while taking this medicine. Women should inform their doctor if they wish to become pregnant or think they might be pregnant. There is a potential for serious side effects to an unborn child. Talk  to your health care professional or pharmacist for more information. What side effects may I notice from receiving this medicine? Side effects that you should report to your doctor or health care professional as soon as possible: -allergic reactions like skin rash, itching or hives, swelling of the face, lips, or tongue -bone pain -breathing problems -dizziness -jaw pain, especially after dental work -redness, blistering, peeling of the skin -signs and symptoms of infection like fever or chills; cough; sore throat; pain or trouble passing urine -signs of low calcium like fast heartbeat, muscle cramps or muscle pain; pain, tingling, numbness in the hands or feet; seizures -unusual bleeding or bruising -unusually weak or tired Side effects that usually do not require medical attention (report to your doctor or health care professional if they continue or are bothersome): -constipation -diarrhea -headache -joint pain -loss of appetite -muscle pain -runny nose -tiredness -upset stomach This list may not describe all possible side effects. Call your doctor for medical advice about side effects. You may report side effects to FDA at 1-800-FDA-1088. Where should I keep my medicine? This medicine is only given in a clinic, doctor's office, or other health care setting and will not be stored at home. NOTE: This sheet is a summary. It may not cover all possible information. If you have questions about this medicine, talk to your doctor, pharmacist, or health care provider.  2018 Elsevier/Gold Standard (2016-05-01 19:17:21) Goserelin injection What is this medicine? GOSERELIN (GOE se rel in) is similar to a hormone found in the body. It lowers the amount of sex hormones that the body makes. Men will have lower testosterone levels and women will have lower estrogen levels while taking this medicine. In men, this medicine is used to treat prostate cancer; the injection is either given once per  month or once every 12 weeks. A once per month injection (only) is used to treat women with endometriosis, dysfunctional uterine bleeding, or advanced breast cancer. This medicine may be used for other purposes; ask your health care provider or pharmacist if you have questions. COMMON BRAND NAME(S): Zoladex What should I tell my health care provider before I take this medicine? They need to know if you have any of these conditions (some only apply to women): -diabetes -heart disease or previous heart attack -high blood pressure -high cholesterol -kidney disease -osteoporosis or low bone density -problems passing urine -spinal cord injury -stroke -tobacco smoker -an unusual or allergic reaction to goserelin, hormone therapy, other medicines, foods, dyes, or preservatives -pregnant or trying to get pregnant -breast-feeding How should I use this medicine? This medicine is for injection under the skin. It is given by a health care professional in a hospital or clinic setting. Men receive this injection once every 4 weeks or once every 12 weeks. Women will only receive the once every 4 weeks injection. Talk to your pediatrician regarding the use of this medicine in children. Special care may be needed. Overdosage: If you think you have taken too much of this medicine contact a poison control center or emergency room at once. NOTE: This medicine is only for you. Do not share this medicine with others. What if I miss a dose? It is important not   to miss your dose. Call your doctor or health care professional if you are unable to keep an appointment. What may interact with this medicine? -female hormones like estrogen -herbal or dietary supplements like black cohosh, chasteberry, or DHEA -female hormones like testosterone -prasterone This list may not describe all possible interactions. Give your health care provider a list of all the medicines, herbs, non-prescription drugs, or dietary  supplements you use. Also tell them if you smoke, drink alcohol, or use illegal drugs. Some items may interact with your medicine. What should I watch for while using this medicine? Visit your doctor or health care professional for regular checks on your progress. Your symptoms may appear to get worse during the first weeks of this therapy. Tell your doctor or healthcare professional if your symptoms do not start to get better or if they get worse after this time. Your bones may get weaker if you take this medicine for a long time. If you smoke or frequently drink alcohol you may increase your risk of bone loss. A family history of osteoporosis, chronic use of drugs for seizures (convulsions), or corticosteroids can also increase your risk of bone loss. Talk to your doctor about how to keep your bones strong. This medicine should stop regular monthly menstration in women. Tell your doctor if you continue to menstrate. Women should not become pregnant while taking this medicine or for 12 weeks after stopping this medicine. Women should inform their doctor if they wish to become pregnant or think they might be pregnant. There is a potential for serious side effects to an unborn child. Talk to your health care professional or pharmacist for more information. Do not breast-feed an infant while taking this medicine. Men should inform their doctors if they wish to father a child. This medicine may lower sperm counts. Talk to your health care professional or pharmacist for more information. What side effects may I notice from receiving this medicine? Side effects that you should report to your doctor or health care professional as soon as possible: -allergic reactions like skin rash, itching or hives, swelling of the face, lips, or tongue -bone pain -breathing problems -changes in vision -chest pain -feeling faint or lightheaded, falls -fever, chills -pain, swelling, warmth in the leg -pain, tingling,  numbness in the hands or feet -signs and symptoms of low blood pressure like dizziness; feeling faint or lightheaded, falls; unusually weak or tired -stomach pain -swelling of the ankles, feet, hands -trouble passing urine or change in the amount of urine -unusually high or low blood pressure -unusually weak or tired Side effects that usually do not require medical attention (report to your doctor or health care professional if they continue or are bothersome): -change in sex drive or performance -changes in breast size in both males and females -changes in emotions or moods -headache -hot flashes -irritation at site where injected -loss of appetite -skin problems like acne, dry skin -vaginal dryness This list may not describe all possible side effects. Call your doctor for medical advice about side effects. You may report side effects to FDA at 1-800-FDA-1088. Where should I keep my medicine? This drug is given in a hospital or clinic and will not be stored at home. NOTE: This sheet is a summary. It may not cover all possible information. If you have questions about this medicine, talk to your doctor, pharmacist, or health care provider.  2018 Elsevier/Gold Standard (2013-06-16 11:10:35)   

## 2016-09-13 NOTE — Progress Notes (Signed)
Patient Care Team: System, Provider Not In as PCP - General Debbie Kussmaul, MD as Consulting Physician (General Surgery) [name], MD as Consulting Physician (Hematology and Oncology) [name], MD as Attending Physician (Radiation Oncology) Benson Norway, RN as Registered Nurse (Oncology) Sylvan Cheese, NP as Nurse Practitioner (Hematology and Oncology)  DIAGNOSIS:  Encounter Diagnosis  Name Primary?  . Malignant neoplasm of upper-inner quadrant of right breast in female, estrogen receptor positive ([place])     SUMMARY OF ONCOLOGIC HISTORY:   Breast cancer of upper-inner quadrant of right female breast ([place])   01/12/2014 Mammogram    Right breast - two lesions: #1 2:00 position 1.6 x 1.9 cm and #2 12:00 position 0.5 x 0.9 cm. Distance between the 2 was 4.5 cm, right axillary lymph node enlargement      01/12/2014 Initial Biopsy    Right breast 3 biopsies: All were IDC with DCIS ER+ PR + Ki-67 85% HER-2 negative: Right axillary lymph node also positive for metastatic cancer ER positive HER-2 negative Ki-67 80% grade 2      01/18/2014 Breast MRI    Right breast 12:00 position 2.8 x 2 x 2.2 cm: 2:00 position 1.3 x 0.8 x 0.5 cm, right axilla multiple enlarged lymph nodes 1 cm in size, right retropectoral lymph node 0.6 cm      01/18/2014 Clinical Stage    Stage IIB T2 N1      01/25/2014 Procedure    Breast/Ovarian ([facility]) reveals no clinically significant variant at ATM, BARD1, BRCA1, BRCA2, BRIP1, CDH1, CHEK2, EPCAM, FANCC, MLH1, MSH2, MSH6, NBN, PALB2, PMS2, PTEN, RAD51C, RAD51D, TP53, and XRCC2.       02/11/2014 -  Neo-Adjuvant Chemotherapy    Doxorubicin and cyclophosphamide X 4 followed by Taxol weekly x12       07/12/2014 Breast MRI    Partial response to neoadjuvant chemotherapy right breast mass decreased from 2.8 cm to 2.3 cm, 2 small masses along the medial margin also decreased; significant decrease right breast mass 11 to 12:00 and 13 mm to 8 mm,  decrease in right axilla LN      08/23/2014 Definitive Surgery    Right lumpectomy/SLNB [name]) 2:00: IDC grade 3, 3.2 cm, high-grade DCIS, LV I present, PNI present,; 12:00: IDC grade 30.8 cm, high-grade DCIS, LV I, 4/4 lymph nodes positive with ECE, ER 90%, PR 40%, HER-2 negative margin positive      08/23/2014 Pathologic Stage    Stage IIIA: ypT2 ypN2a      09/15/2014 Surgery    Reexcision of margins: clear; no evidence of malignancy      11/03/2014 - 12/14/2014 Radiation Therapy    Adjuvant XRT Isidore Moos): Right Breast, supraclavicular nodes, axillary nodes, and internal mammary nodes: 50 Gy over 25 fractions; right breast boost: 10 Gy over 5 fractions. Total dose: 60 Gy      12/29/2014 -  Anti-estrogen oral therapy    Tamoxifen 20 mg daily      04/08/2015 Survivorship    Survivorship care plan completed and mailed to patient in lieu of in person visit at her request      12/31/2015 Relapse/Recurrence    Innumerable osseous metastases with acute pathologic fracture the L4 vertebral body. 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 L5-S1 protrusion with left more than right S1 impingement      01/05/2016 - 01/12/2016 Radiation Therapy    Palliative radiation to the spine for cord compression      01/13/2016 -  Anti-estrogen oral therapy    Ribociclib 3 weeks on and 1 week off and Letrozole (with Zolodex until oophorectomy) and Xgeva for bone mets       CHIEF COMPLIANT: Follow-up on Ribociclib and letrozole  INTERVAL HISTORY: Debbie Martinez is a 41 year old with above-mentioned history of metastatic breast cancer currently on Ribociclib and letrozole. She is tolerating Ribociclib fairly well except for occasional no nausea and recently diarrhea. She is able to walk without assistance. She denies any major pain or discomfort. She does take pain medication for severe pain occasionally.  REVIEW OF SYSTEMS:   Constitutional: Denies fevers, chills or abnormal  weight loss Eyes: Denies blurriness of vision Ears, nose, mouth, throat, and face: Denies mucositis or sore throat Respiratory: Denies cough, dyspnea or wheezes Cardiovascular: Denies palpitation, chest discomfort Gastrointestinal:  Denies nausea, heartburn or change in bowel habits Skin: Denies abnormal skin rashes Lymphatics: Denies new lymphadenopathy or easy bruising Neurological:Denies numbness, tingling or new weaknesses Behavioral/Psych: Mood is stable, no new changes  Extremities: No lower extremity edema Breast:  denies any pain or lumps or nodules in either breasts All other systems were reviewed with the patient and are negative.  I have reviewed the past medical history, past surgical history, social history and family history with the patient and they are unchanged from previous note.  ALLERGIES:  is allergic to lisinopril and tamoxifen.  MEDICATIONS:  Current Outpatient Prescriptions  Medication Sig Dispense Refill  . ACCU-CHEK AVIVA PLUS test strip USE AS DIRECTED 4 TIMES A DAY 100 each 0  . ACCU-CHEK SOFTCLIX LANCETS lancets 100 each by Other route 4 (four) times daily. Use as instructed 100 each 0  . atenolol (TENORMIN) 50 MG tablet Take 50 mg by mouth daily.    . blood glucose meter kit and supplies KIT Dispense based on patient and insurance preference. Use up to four times daily as directed. (FOR ICD-9 250.00, 250.01). 1 each 0  . Cholecalciferol (VITAMIN D-3 PO) Take 1 capsule by mouth daily.    . diazepam (VALIUM) 5 MG tablet Take 1 tablet (5 mg total) by mouth 2 (two) times daily. (Patient not taking: Reported on 03/14/2016) 10 tablet 0  . ENSURE (ENSURE) Take 237 mLs by mouth 2 (two) times daily between meals.     . gabapentin (NEURONTIN) 300 MG capsule Take 1 capsule (300 mg total) by mouth 3 (three) times daily. 90 capsule 5  . Iron-Vitamins (GERITOL) LIQD Take 5 mLs by mouth daily.    Marland Kitchen KISQALI 200 DOSE 200 MG TABS TAKE 3 TABLETS BY MOUTH DAILY. 3 WEEKS ON AND  1 WEEK OFF 63 tablet 6  . letrozole (FEMARA) 2.5 MG tablet Take 1 tablet (2.5 mg total) by mouth daily. 90 tablet 3  . Lidocaine-Prilocaine, Bulk, 2.5-2.5 % CREA Apply 5 mLs topically as needed. 2 hours before use, cover with plawtic wrap. 30 g prn  . Magnesium Salicylate (DOANS PILLS PO) Take 2 tablets by mouth daily as needed (for pain).    . metFORMIN (GLUCOPHAGE) 500 MG tablet Take 1 tablet (500 mg total) by mouth 2 (two) times daily with a meal. (Patient not taking: Reported on 05/30/2016) 60 tablet 0  . methocarbamol (ROBAXIN) 500 MG tablet Take 1 tablet (500 mg total) by mouth 2 (two) times daily as needed for muscle spasms. (Patient taking differently: Take 1,000 mg by mouth 4 (four) times daily as needed for muscle spasms. ) 20 tablet 0  . omeprazole (PRILOSEC) 20 MG capsule Take 1 capsule (  20 mg total) by mouth daily. Take while using dexamethasone to prevent heartburn. 30 capsule 2  . oxyCODONE-acetaminophen (PERCOCET/ROXICET) 5-325 MG tablet Take 1 tablet by mouth every 6 (six) hours as needed for severe pain. 120 tablet 0  . polyethylene glycol (MIRALAX / GLYCOLAX) packet Take 17 g by mouth daily. (Patient not taking: Reported on 05/30/2016) 14 each 0  . Ribociclib Succinate 200 MG TABS Take 600 mg by mouth daily. 3 weeks on 1 week off 63 tablet 6  . traMADol (ULTRAM) 50 MG tablet Take 50-100 mg by mouth every 6 (six) hours as needed (for pain).     No current facility-administered medications for this visit.    Facility-Administered Medications Ordered in Other Visits  Medication Dose Route Frequency Provider Last Rate Last Dose  . goserelin (ZOLADEX) injection 3.6 mg  3.6 mg Subcutaneous Q28 days [name], MD   3.6 mg at 01/27/16 1009  . goserelin (ZOLADEX) injection 3.6 mg  3.6 mg Subcutaneous Q28 days [name], MD   3.6 mg at 03/23/16 1122    PHYSICAL EXAMINATION: ECOG PERFORMANCE STATUS: 1 - Symptomatic but completely ambulatory  Vitals:   09/13/16 1004  BP: (!)  156/102  Pulse: 96  Resp: 18  Temp: 98.4 F (36.9 C)   Filed Weights   09/13/16 1004  Weight: 184 lb (83.5 kg)    GENERAL:alert, no distress and comfortable SKIN: skin color, texture, turgor are normal, no rashes or significant lesions EYES: normal, Conjunctiva are pink and non-injected, sclera clear OROPHARYNX:no exudate, no erythema and lips, buccal mucosa, and tongue normal  NECK: supple, thyroid normal size, non-tender, without nodularity LYMPH:  no palpable lymphadenopathy in the cervical, axillary or inguinal LUNGS: clear to auscultation and percussion with normal breathing effort HEART: regular rate & rhythm and no murmurs and no lower extremity edema ABDOMEN:abdomen soft, non-tender and normal bowel sounds MUSCULOSKELETAL:no cyanosis of digits and no clubbing  NEURO: alert & oriented x 3 with fluent speech, no focal motor/sensory deficits EXTREMITIES: No lower extremity edema  LABORATORY DATA:  I have reviewed the data as listed   Chemistry      Component Value Date/Time   NA 138 09/13/2016 0946   K 3.8 09/13/2016 0946   CL 98 (L) 01/04/2016 0504   CO2 26 09/13/2016 0946   BUN 7.9 09/13/2016 0946   CREATININE 0.8 09/13/2016 0946      Component Value Date/Time   CALCIUM 9.1 09/13/2016 0946   ALKPHOS 51 09/13/2016 0946   AST 24 09/13/2016 0946   ALT 30 09/13/2016 0946   BILITOT 0.37 09/13/2016 0946       Lab Results  Component Value Date   WBC 1.5 (L) 09/13/2016   HGB 11.3 (L) 09/13/2016   HCT 34.7 (L) 09/13/2016   MCV 95.7 09/13/2016   PLT 192 09/13/2016   NEUTROABS 0.9 (L) 09/13/2016    ASSESSMENT & PLAN:  Breast cancer of upper-inner quadrant of right female breast ([facility]) 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   Treatment summary:  1. Patient completed neoadjuvant dose dense Adriamycin and Cytoxan and weekly  Taxol X 12 started 02/09/2014 and completed 07/01/14 2. Right double lumpectomy 2:00: IDC grade 3, 3.2 cm, high-grade DCIS, LV I present, PNI present,; 12:00: IDC grade 3; 0.8 cm, high-grade DCIS, LVI, 4/4 lymph nodes positive with ECE, ER 90%, PR 40%, HER-2 negative margin positive T2 N2  M0 stage IIIa 3. Patient is on [employer] clinical trial and she was randomized intraoperatively for NO lymph node dissection. 4.Tamoxifen 20 mg daily started 12/29/2014 stopped July 2016 for adverse effects 5. 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 Palliative XRT to the spine for cord compression --------------------------------------------------------------------------------------------------------------------------------------------------------- Current treatment: Ribociclibwith letrozole plus ovarian suppression Bone Mets: Xgeva with calcium and vitamin D Ribociclib toxicities:  1.Fatigue 2. Neutropenia ANC  Today is 1.8 continue with the same dosage    I spent 25 minutes talking to the patient of which more than half was spent in counseling and coordination of care.  Orders Placed This Encounter  Procedures  . CT Abdomen Pelvis W Contrast    Standing Status:   Future    Standing Expiration Date:   09/13/2017    Order Specific Question:   If indicated for the ordered procedure, I authorize the administration of contrast media per Radiology protocol    Answer:   Yes    Order Specific Question:   Reason for Exam (SYMPTOM  OR DIAGNOSIS REQUIRED)    Answer:   Metastatic breast cancer restaging    Order Specific Question:   Is patient pregnant?    Answer:   No    Order Specific Question:   Preferred imaging location?    Answer:   [facility]    Order Specific Question:   Radiology Contrast Protocol - do NOT remove file path    Answer:    \\[email or web address].pdf  . CT Chest W Contrast    Standing Status:   Future    Standing Expiration Date:   09/13/2017    Order Specific Question:   If indicated for the ordered procedure, I authorize the administration of contrast media per Radiology protocol    Answer:   Yes    Order Specific Question:   Reason for Exam (SYMPTOM  OR DIAGNOSIS REQUIRED)    Answer:   Metastatic breast cancer restaging    Order Specific Question:   Is patient pregnant?    Answer:   No    Order Specific Question:   Preferred imaging location?    Answer:   [facility]    Order Specific Question:   Radiology Contrast Protocol - do NOT remove file path    Answer:   \\[email or web address].pdf  . NM Bone Scan Whole Body    Standing Status:   Future    Standing Expiration Date:   09/13/2017    Order Specific Question:   Reason for Exam (SYMPTOM  OR DIAGNOSIS REQUIRED)    Answer:   Metastatic breast cancer restaging    Order Specific Question:   If indicated for the ordered procedure, I authorize the administration of a radiopharmaceutical per Radiology protocol    Answer:   Yes    Order Specific Question:   Is the patient pregnant?    Answer:   No    Order Specific Question:   Preferred imaging location?    Answer:   [facility]    Order Specific Question:   Radiology Contrast Protocol - do NOT remove file path    Answer:   \\[email or web address]   The patient has a good understanding of the overall plan. she agrees with it. she will call with any problems that may develop before the next visit here.   Rulon Eisenmenger, MD 09/13/16

## 2016-10-02 ENCOUNTER — Telehealth: Payer: Self-pay | Admitting: *Deleted

## 2016-10-02 NOTE — Telephone Encounter (Signed)
Will call patient to follow up with her symptoms. Thank you.

## 2016-10-02 NOTE — Telephone Encounter (Signed)
"  Last night the ED told me to call my doctor about pain with my teeth.  I'm on Gabapentin and read on-line this can affect the teeth.  The past three days, left side gum tissue under my teeth is sore.   When I floss this area it tries to bleed.  Seems like my teeth are loosening.  Small amount swelling to left jaw.  I do not have thermometer to check temperature.  Routine dental cleaning done last month.  Told my dentist about teeth and gum area sore but nothing was found.  Cavity fillings done several years ago.  [phone]."

## 2016-10-02 NOTE — Telephone Encounter (Signed)
1300- Called pt back to follow up on her dental symptoms. Pt states that she started noticing that her jaw was sore and her 1-2 teeth on the bottom are loosening. Pt denies recent illness, fevers, or oral pus. Pt states that she had gone to her dentist for dental cleaning 1 month ago. Pt is currently on xgeva and zoladex injection. Told pt that sometimes xgeva can cause osteonecrosis and pt will need to go back to his dentist to get some xrays taken, in order to evaluate her oral health and if xgeva will need to be on hold temporarily vs permanently. Dr.Gudena was notified and is aware. Advised pt to avoid flossing too much and to gargle with warm salt water 3-4x daily, especially after eating. Avoid eating hard foods and to also take tylenol or motrin to help with pain. Pt may also apply warm compress around the sore jaw for comfort. Pt states that she had xrays of her mouth done last year. Told pt that she may need to have another xray done this year, especially when her teeth are loose. Encouraged pt to call with any dental updates. Pt will see Dr.Gudena in 2 weeks and will discuss further treatment with xgeva.

## 2016-10-10 MED FILL — tiZANidine HCL 4 MG TABS: 4 | 10 days supply | Qty: 15 | Fill #0

## 2016-10-10 MED FILL — OXYCODONE-ACETAMINOPHEN 5-3: 5-325 | 30 days supply | Qty: 90 | Fill #0

## 2016-10-11 ENCOUNTER — Ambulatory Visit: Payer: Medicaid Other

## 2016-10-11 MED FILL — KISQALI 200 MG DAILY DOSE: 200 | 28 days supply | Qty: 63 | Fill #1

## 2016-10-11 MED FILL — LETROZOLE 2.5 MG TABLET: 2.5 | 30 days supply | Qty: 30 | Fill #7

## 2016-10-15 ENCOUNTER — Ambulatory Visit (HOSPITAL_BASED_OUTPATIENT_CLINIC_OR_DEPARTMENT_OTHER): Payer: Medicare Other | Admitting: Hematology and Oncology

## 2016-10-15 ENCOUNTER — Other Ambulatory Visit (HOSPITAL_BASED_OUTPATIENT_CLINIC_OR_DEPARTMENT_OTHER): Payer: Medicaid Other

## 2016-10-15 ENCOUNTER — Ambulatory Visit (HOSPITAL_BASED_OUTPATIENT_CLINIC_OR_DEPARTMENT_OTHER): Payer: Medicare Other

## 2016-10-15 ENCOUNTER — Encounter: Payer: Self-pay | Admitting: Hematology and Oncology

## 2016-10-15 VITALS — BP 135/92 | HR 97

## 2016-10-15 DIAGNOSIS — C7951 Secondary malignant neoplasm of bone: Secondary | ICD-10-CM

## 2016-10-15 DIAGNOSIS — Z170 Estrogen receptor positive status [ER+]: Principal | ICD-10-CM

## 2016-10-15 DIAGNOSIS — C50211 Malignant neoplasm of upper-inner quadrant of right female breast: Secondary | ICD-10-CM

## 2016-10-15 DIAGNOSIS — C773 Secondary and unspecified malignant neoplasm of axilla and upper limb lymph nodes: Secondary | ICD-10-CM

## 2016-10-15 DIAGNOSIS — D701 Agranulocytosis secondary to cancer chemotherapy: Secondary | ICD-10-CM | POA: Diagnosis not present

## 2016-10-15 DIAGNOSIS — C50911 Malignant neoplasm of unspecified site of right female breast: Secondary | ICD-10-CM

## 2016-10-15 DIAGNOSIS — Z5111 Encounter for antineoplastic chemotherapy: Secondary | ICD-10-CM | POA: Diagnosis not present

## 2016-10-15 LAB — COMPREHENSIVE METABOLIC PANEL
ALT: 29 U/L (ref 0–55)
AST: 16 U/L (ref 5–34)
Albumin: 3.8 g/dL (ref 3.5–5.0)
Alkaline Phosphatase: 50 U/L (ref 40–150)
Anion Gap: 10 mEq/L (ref 3–11)
BUN: 9.3 mg/dL (ref 7.0–26.0)
CHLORIDE: 104 meq/L (ref 98–109)
CO2: 25 mEq/L (ref 22–29)
Calcium: 9.6 mg/dL (ref 8.4–10.4)
Creatinine: 0.8 mg/dL (ref 0.6–1.1)
GLUCOSE: 155 mg/dL — AB (ref 70–140)
POTASSIUM: 3.6 meq/L (ref 3.5–5.1)
SODIUM: 140 meq/L (ref 136–145)
Total Bilirubin: 0.23 mg/dL (ref 0.20–1.20)
Total Protein: 7.4 g/dL (ref 6.4–8.3)

## 2016-10-15 LAB — CBC WITH DIFFERENTIAL/PLATELET
BASO%: 0.3 % (ref 0.0–2.0)
BASOS ABS: 0 10*3/uL (ref 0.0–0.1)
EOS%: 0.6 % (ref 0.0–7.0)
Eosinophils Absolute: 0 10*3/uL (ref 0.0–0.5)
HCT: 34.6 % — ABNORMAL LOW (ref 34.8–46.6)
HGB: 11.4 g/dL — ABNORMAL LOW (ref 11.6–15.9)
LYMPH%: 14.9 % (ref 14.0–49.7)
MCH: 31.6 pg (ref 25.1–34.0)
MCHC: 33 g/dL (ref 31.5–36.0)
MCV: 95.7 fL (ref 79.5–101.0)
MONO#: 0.4 10*3/uL (ref 0.1–0.9)
MONO%: 15.7 % — AB (ref 0.0–14.0)
NEUT#: 1.9 10*3/uL (ref 1.5–6.5)
NEUT%: 68.5 % (ref 38.4–76.8)
Platelets: 240 10*3/uL (ref 145–400)
RBC: 3.62 10*6/uL — AB (ref 3.70–5.45)
RDW: 15.4 % — AB (ref 11.2–14.5)
WBC: 2.7 10*3/uL — ABNORMAL LOW (ref 3.9–10.3)
lymph#: 0.4 10*3/uL — ABNORMAL LOW (ref 0.9–3.3)

## 2016-10-15 MED ORDER — DENOSUMAB 120 MG/1.7ML ~~LOC~~ SOLN
120.0000 mg | Freq: Once | SUBCUTANEOUS | Status: AC
  Administered 2016-10-15: 120 mg via SUBCUTANEOUS
  Filled 2016-10-15: qty 1.7

## 2016-10-15 MED ORDER — GOSERELIN ACETATE 3.6 MG ~~LOC~~ IMPL
3.6000 mg | DRUG_IMPLANT | Freq: Once | SUBCUTANEOUS | Status: AC
  Administered 2016-10-15: 3.6 mg via SUBCUTANEOUS
  Filled 2016-10-15: qty 3.6

## 2016-10-15 NOTE — Assessment & Plan Note (Signed)
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   Treatment summary:  1. Patient completed neoadjuvant dose dense Adriamycin and Cytoxan and weekly Taxol X 12 started 02/09/2014 and completed 07/01/14 2. Right double lumpectomy 2:00: IDC grade 3, 3.2 cm, high-grade DCIS, LV I present, PNI present,; 12:00: IDC grade 3; 0.8 cm, high-grade DCIS, LVI, 4/4 lymph nodes positive with ECE, ER 90%, PR 40%, HER-2 negative margin positive T2 N2 M0 stage IIIa 3. Patient is on [employer] clinical trial and she was randomized intraoperatively for NO lymph node dissection. 4.Tamoxifen 20 mg daily started 12/29/2014 stopped July 2016 for adverse effects 5. 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 Palliative XRT to the spine for cord compression --------------------------------------------------------------------------------------------------------------------------------------------------------- Current treatment: Ribociclibwith letrozole plus ovarian suppression started 01/13/2016 Bone Mets: Xgeva with calcium and vitamin D Ribociclib toxicities:  1.Fatigue 2. Neutropenia ANC Today is 1.8 continue with the same dosage   Plan to obtain scans and follow-up to see me in one month

## 2016-10-15 NOTE — Progress Notes (Signed)
Patient Care Team: System, Provider Not In as PCP - General [name], MD as Consulting Physician (General Surgery) [name], MD as Consulting Physician (Hematology and Oncology) [name], MD as Attending Physician (Radiation Oncology) Benson Norway, RN as Registered Nurse (Oncology) Sylvan Cheese, NP as Nurse Practitioner (Hematology and Oncology)  DIAGNOSIS:  Encounter Diagnosis  Name Primary?  . Malignant neoplasm of upper-inner quadrant of right breast in female, estrogen receptor positive ([place])     SUMMARY OF ONCOLOGIC HISTORY:   Breast cancer of upper-inner quadrant of right female breast ([place])   01/12/2014 Mammogram    Right breast - two lesions: #1 2:00 position 1.6 x 1.9 cm and #2 12:00 position 0.5 x 0.9 cm. Distance between the 2 was 4.5 cm, right axillary lymph node enlargement      01/12/2014 Initial Biopsy    Right breast 3 biopsies: All were IDC with DCIS ER+ PR + Ki-67 85% HER-2 negative: Right axillary lymph node also positive for metastatic cancer ER positive HER-2 negative Ki-67 80% grade 2      01/18/2014 Breast MRI    Right breast 12:00 position 2.8 x 2 x 2.2 cm: 2:00 position 1.3 x 0.8 x 0.5 cm, right axilla multiple enlarged lymph nodes 1 cm in size, right retropectoral lymph node 0.6 cm      01/18/2014 Clinical Stage    Stage IIB T2 N1      01/25/2014 Procedure    Breast/Ovarian ([facility]) reveals no clinically significant variant at ATM, BARD1, BRCA1, BRCA2, BRIP1, CDH1, CHEK2, EPCAM, FANCC, MLH1, MSH2, MSH6, NBN, PALB2, PMS2, PTEN, RAD51C, RAD51D, TP53, and XRCC2.       02/11/2014 -  Neo-Adjuvant Chemotherapy    Doxorubicin and cyclophosphamide X 4 followed by Taxol weekly x12       07/12/2014 Breast MRI    Partial response to neoadjuvant chemotherapy right breast mass decreased from 2.8 cm to 2.3 cm, 2 small masses along the medial margin also decreased; significant decrease right breast mass 11 to 12:00 and 13 mm to 8 mm,  decrease in right axilla LN      08/23/2014 Definitive Surgery    Right lumpectomy/SLNB [name]) 2:00: IDC grade 3, 3.2 cm, high-grade DCIS, LV I present, PNI present,; 12:00: IDC grade 30.8 cm, high-grade DCIS, LV I, 4/4 lymph nodes positive with ECE, ER 90%, PR 40%, HER-2 negative margin positive      08/23/2014 Pathologic Stage    Stage IIIA: ypT2 ypN2a      09/15/2014 Surgery    Reexcision of margins: clear; no evidence of malignancy      11/03/2014 - 12/14/2014 Radiation Therapy    Adjuvant XRT Isidore Moos): Right Breast, supraclavicular nodes, axillary nodes, and internal mammary nodes: 50 Gy over 25 fractions; right breast boost: 10 Gy over 5 fractions. Total dose: 60 Gy      12/29/2014 -  Anti-estrogen oral therapy    Tamoxifen 20 mg daily      04/08/2015 Survivorship    Survivorship care plan completed and mailed to patient in lieu of in person visit at her request      12/31/2015 Relapse/Recurrence    Innumerable osseous metastases with acute pathologic fracture the L4 vertebral body. 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 L5-S1 protrusion with left more than right S1 impingement      01/05/2016 - 01/12/2016 Radiation Therapy    Palliative radiation to the spine for cord compression      01/13/2016 -  Anti-estrogen oral therapy    Ribociclib 3 weeks on and 1 week off and Letrozole (with Zolodex until oophorectomy) and Xgeva for bone mets       CHIEF COMPLIANT: Follow-up on Ribociclib letrozole and Zoladex and Xgeva  INTERVAL HISTORY: Debbie Martinez is a 41 year old with above-mentioned history of metastatic breast cancer who is currently on Ribociclib along with letrozole and Zoladex. She is tolerating the treatment fairly well. She is complaining of a mild toothache.  It appears to be getting better. She does not have any prior history of Caries. She has had cavities for which she had fillings.  REVIEW OF SYSTEMS:   Constitutional: Denies  fevers, chills or abnormal weight loss Eyes: Denies blurriness of vision Ears, nose, mouth, throat, and face: Denies mucositis or sore throat Respiratory: Denies cough, dyspnea or wheezes Cardiovascular: Denies palpitation, chest discomfort Gastrointestinal:  Denies nausea, heartburn or change in bowel habits Skin: Denies abnormal skin rashes Lymphatics: Denies new lymphadenopathy or easy bruising Neurological: Patient uses a back brace and is able to walk without any support Behavioral/Psych: Mood is stable, no new changes  Extremities: No lower extremity edema All other systems were reviewed with the patient and are negative.  I have reviewed the past medical history, past surgical history, social history and family history with the patient and they are unchanged from previous note.  ALLERGIES:  is allergic to lisinopril and tamoxifen.  MEDICATIONS:  Current Outpatient Prescriptions  Medication Sig Dispense Refill  . ACCU-CHEK AVIVA PLUS test strip USE AS DIRECTED 4 TIMES A DAY 100 each 0  . ACCU-CHEK SOFTCLIX LANCETS lancets 100 each by Other route 4 (four) times daily. Use as instructed 100 each 0  . atenolol (TENORMIN) 50 MG tablet Take 50 mg by mouth daily.    . blood glucose meter kit and supplies KIT Dispense based on patient and insurance preference. Use up to four times daily as directed. (FOR ICD-9 250.00, 250.01). 1 each 0  . Cholecalciferol (VITAMIN D-3 PO) Take 1 capsule by mouth daily.    . diazepam (VALIUM) 5 MG tablet Take 1 tablet (5 mg total) by mouth 2 (two) times daily. (Patient not taking: Reported on 03/14/2016) 10 tablet 0  . ENSURE (ENSURE) Take 237 mLs by mouth 2 (two) times daily between meals.     . gabapentin (NEURONTIN) 300 MG capsule Take 1 capsule (300 mg total) by mouth 3 (three) times daily. 90 capsule 5  . Iron-Vitamins (GERITOL) LIQD Take 5 mLs by mouth daily.    Marland Kitchen KISQALI 200 DOSE 200 MG TABS TAKE 3 TABLETS BY MOUTH DAILY. 3 WEEKS ON AND 1 WEEK OFF 63  tablet 6  . letrozole (FEMARA) 2.5 MG tablet Take 1 tablet (2.5 mg total) by mouth daily. 90 tablet 3  . Lidocaine-Prilocaine, Bulk, 2.5-2.5 % CREA Apply 5 mLs topically as needed. 2 hours before use, cover with plawtic wrap. 30 g prn  . Magnesium Salicylate (DOANS PILLS PO) Take 2 tablets by mouth daily as needed (for pain).    . metFORMIN (GLUCOPHAGE) 500 MG tablet Take 1 tablet (500 mg total) by mouth 2 (two) times daily with a meal. (Patient not taking: Reported on 05/30/2016) 60 tablet 0  . methocarbamol (ROBAXIN) 500 MG tablet Take 1 tablet (500 mg total) by mouth 2 (two) times daily as needed for muscle spasms. (Patient taking differently: Take 1,000 mg by mouth 4 (four) times daily as needed for muscle spasms. ) 20 tablet 0  . omeprazole (PRILOSEC)  20 MG capsule Take 1 capsule (20 mg total) by mouth daily. Take while using dexamethasone to prevent heartburn. 30 capsule 2  . oxyCODONE-acetaminophen (PERCOCET/ROXICET) 5-325 MG tablet Take 1 tablet by mouth every 6 (six) hours as needed for severe pain. 120 tablet 0  . polyethylene glycol (MIRALAX / GLYCOLAX) packet Take 17 g by mouth daily. (Patient not taking: Reported on 05/30/2016) 14 each 0  . Ribociclib Succinate 200 MG TABS Take 600 mg by mouth daily. 3 weeks on 1 week off 63 tablet 6  . traMADol (ULTRAM) 50 MG tablet Take 50-100 mg by mouth every 6 (six) hours as needed (for pain).     No current facility-administered medications for this visit.    Facility-Administered Medications Ordered in Other Visits  Medication Dose Route Frequency Provider Last Rate Last Dose  . goserelin (ZOLADEX) injection 3.6 mg  3.6 mg Subcutaneous Q28 days [name], MD   3.6 mg at 01/27/16 1009  . goserelin (ZOLADEX) injection 3.6 mg  3.6 mg Subcutaneous Q28 days [name], MD   3.6 mg at 03/23/16 1122    PHYSICAL EXAMINATION: ECOG PERFORMANCE STATUS: 1 - Symptomatic but completely ambulatory  Vitals:   10/15/16 1109  BP: (!) 143/101  Pulse:  (!) 101  Resp: 18  Temp: 98.5 F (36.9 C)   Filed Weights   10/15/16 1109  Weight: 184 lb 6.4 oz (83.6 kg)    GENERAL:alert, no distress and comfortable SKIN: skin color, texture, turgor are normal, no rashes or significant lesions EYES: normal, Conjunctiva are pink and non-injected, sclera clear OROPHARYNX:no exudate, no erythema and lips, buccal mucosa, and tongue normal  NECK: supple, thyroid normal size, non-tender, without nodularity LYMPH:  no palpable lymphadenopathy in the cervical, axillary or inguinal LUNGS: clear to auscultation and percussion with normal breathing effort HEART: regular rate & rhythm and no murmurs and no lower extremity edema ABDOMEN:abdomen soft, non-tender and normal bowel sounds MUSCULOSKELETAL:no cyanosis of digits and no clubbing  NEURO: alert & oriented x 3 with fluent speech, no focal motor/sensory deficits EXTREMITIES: No lower extremity edema  LABORATORY DATA:  I have reviewed the data as listed   Chemistry      Component Value Date/Time   NA 138 09/13/2016 0946   K 3.8 09/13/2016 0946   CL 98 (L) 01/04/2016 0504   CO2 26 09/13/2016 0946   BUN 7.9 09/13/2016 0946   CREATININE 0.8 09/13/2016 0946      Component Value Date/Time   CALCIUM 9.1 09/13/2016 0946   ALKPHOS 51 09/13/2016 0946   AST 24 09/13/2016 0946   ALT 30 09/13/2016 0946   BILITOT 0.37 09/13/2016 0946       Lab Results  Component Value Date   WBC 2.7 (L) 10/15/2016   HGB 11.4 (L) 10/15/2016   HCT 34.6 (L) 10/15/2016   MCV 95.7 10/15/2016   PLT 240 10/15/2016   NEUTROABS 1.9 10/15/2016    ASSESSMENT & PLAN:  Breast cancer of upper-inner quadrant of right female breast ([place]) 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   Treatment summary:  1. Patient completed neoadjuvant dose dense Adriamycin and Cytoxan and weekly Taxol X 12  started 02/09/2014 and completed 07/01/14 2. Right double lumpectomy 2:00: IDC grade 3, 3.2 cm, high-grade DCIS, LV I present, PNI present,; 12:00: IDC grade 3; 0.8 cm, high-grade DCIS, LVI, 4/4 lymph nodes positive with ECE, ER 90%,  PR 40%, HER-2 negative margin positive T2 N2 M0 stage IIIa 3. Patient is on [facility] clinical trial and she was randomized intraoperatively for NO lymph node dissection. 4.Tamoxifen 20 mg daily started 12/29/2014 stopped July 2016 for adverse effects 5. 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 Palliative XRT to the spine for cord compression --------------------------------------------------------------------------------------------------------------------------------------------------------- Current treatment: Ribociclibwith letrozole plus ovarian suppression started 01/13/2016 Bone Mets: Xgeva with calcium and vitamin D Mild toothache: I instructed the patient to watch and monitor it. If it gets worse and we will have her see her dentist. Diabetes hypertension: Medications have been recently adjusted.  Ribociclib toxicities:  1.Fatigue 2. Neutropenia ANC Today is 1.9 continue with the same dosage   Plan to obtain scans and follow-up to see me in one month  I spent 25 minutes talking to the patient of which more than half was spent in counseling and coordination of care.  No orders of the defined types were placed in this encounter.  The patient has a good understanding of the overall plan. she agrees with it. she will call with any problems that may develop before the next visit here.   Rulon Eisenmenger, MD 10/15/16

## 2016-11-07 ENCOUNTER — Encounter (HOSPITAL_COMMUNITY): Payer: Self-pay

## 2016-11-07 ENCOUNTER — Encounter (HOSPITAL_COMMUNITY)
Admission: RE | Admit: 2016-11-07 | Discharge: 2016-11-07 | Disposition: A | Discharge status: Home / Self Care | Payer: Medicare Other | Source: Ambulatory Visit | Attending: Hematology and Oncology | Admitting: Hematology and Oncology

## 2016-11-07 ENCOUNTER — Ambulatory Visit (HOSPITAL_COMMUNITY)
Admission: RE | Admit: 2016-11-07 | Discharge: 2016-11-07 | Disposition: A | Discharge status: Home / Self Care | Payer: Medicare Other | Source: Ambulatory Visit | Attending: Hematology and Oncology | Admitting: Hematology and Oncology

## 2016-11-07 DIAGNOSIS — C7951 Secondary malignant neoplasm of bone: Secondary | ICD-10-CM | POA: Insufficient documentation

## 2016-11-07 DIAGNOSIS — C50211 Malignant neoplasm of upper-inner quadrant of right female breast: Secondary | ICD-10-CM | POA: Insufficient documentation

## 2016-11-07 DIAGNOSIS — M4856XA Collapsed vertebra, not elsewhere classified, lumbar region, initial encounter for fracture: Secondary | ICD-10-CM | POA: Insufficient documentation

## 2016-11-07 DIAGNOSIS — Z170 Estrogen receptor positive status [ER+]: Secondary | ICD-10-CM | POA: Diagnosis not present

## 2016-11-07 MED ORDER — TECHNETIUM TC 99M MEDRONATE IV KIT
19.9000 | PACK | Freq: Once | INTRAVENOUS | Status: AC | PRN
  Administered 2016-11-07: 19.9 via INTRAVENOUS

## 2016-11-07 MED ORDER — IOPAMIDOL (ISOVUE-300) INJECTION 61%
INTRAVENOUS | Status: AC
  Filled 2016-11-07: qty 100

## 2016-11-07 MED ORDER — IOPAMIDOL (ISOVUE-300) INJECTION 61%
100.0000 mL | Freq: Once | INTRAVENOUS | Status: AC | PRN
  Administered 2016-11-07: 100 mL via INTRAVENOUS

## 2016-11-07 MED FILL — LETROZOLE 2.5 MG TABLET: 2.5 | 30 days supply | Qty: 30 | Fill #8

## 2016-11-07 MED FILL — KISQALI 200 MG DAILY DOSE: 200 | 28 days supply | Qty: 63 | Fill #2

## 2016-11-08 ENCOUNTER — Ambulatory Visit: Payer: Medicaid Other

## 2016-11-12 ENCOUNTER — Other Ambulatory Visit: Payer: Self-pay

## 2016-11-12 ENCOUNTER — Encounter: Payer: Self-pay | Admitting: Hematology and Oncology

## 2016-11-12 ENCOUNTER — Ambulatory Visit (HOSPITAL_BASED_OUTPATIENT_CLINIC_OR_DEPARTMENT_OTHER): Payer: Medicare Other | Admitting: Hematology and Oncology

## 2016-11-12 ENCOUNTER — Ambulatory Visit (HOSPITAL_BASED_OUTPATIENT_CLINIC_OR_DEPARTMENT_OTHER): Payer: Medicare Other

## 2016-11-12 ENCOUNTER — Other Ambulatory Visit (HOSPITAL_BASED_OUTPATIENT_CLINIC_OR_DEPARTMENT_OTHER): Payer: Medicaid Other

## 2016-11-12 VITALS — BP 162/101 | HR 97 | Temp 98.6°F | Resp 16 | Ht 67.0 in | Wt 182.4 lb

## 2016-11-12 DIAGNOSIS — C773 Secondary and unspecified malignant neoplasm of axilla and upper limb lymph nodes: Secondary | ICD-10-CM | POA: Diagnosis not present

## 2016-11-12 DIAGNOSIS — Z170 Estrogen receptor positive status [ER+]: Principal | ICD-10-CM

## 2016-11-12 DIAGNOSIS — Z5111 Encounter for antineoplastic chemotherapy: Secondary | ICD-10-CM | POA: Diagnosis not present

## 2016-11-12 DIAGNOSIS — C7951 Secondary malignant neoplasm of bone: Secondary | ICD-10-CM

## 2016-11-12 DIAGNOSIS — E119 Type 2 diabetes mellitus without complications: Secondary | ICD-10-CM | POA: Insufficient documentation

## 2016-11-12 DIAGNOSIS — E118 Type 2 diabetes mellitus with unspecified complications: Secondary | ICD-10-CM

## 2016-11-12 DIAGNOSIS — Z79811 Long term (current) use of aromatase inhibitors: Secondary | ICD-10-CM

## 2016-11-12 DIAGNOSIS — C50211 Malignant neoplasm of upper-inner quadrant of right female breast: Secondary | ICD-10-CM

## 2016-11-12 DIAGNOSIS — D701 Agranulocytosis secondary to cancer chemotherapy: Secondary | ICD-10-CM | POA: Diagnosis not present

## 2016-11-12 DIAGNOSIS — C50911 Malignant neoplasm of unspecified site of right female breast: Secondary | ICD-10-CM

## 2016-11-12 LAB — CBC WITH DIFFERENTIAL/PLATELET
BASO%: 0.4 % (ref 0.0–2.0)
Basophils Absolute: 0 10*3/uL (ref 0.0–0.1)
EOS%: 0.9 % (ref 0.0–7.0)
Eosinophils Absolute: 0 10*3/uL (ref 0.0–0.5)
HCT: 34 % — ABNORMAL LOW (ref 34.8–46.6)
HGB: 11.1 g/dL — ABNORMAL LOW (ref 11.6–15.9)
LYMPH%: 14.6 % (ref 14.0–49.7)
MCH: 31.4 pg (ref 25.1–34.0)
MCHC: 32.5 g/dL (ref 31.5–36.0)
MCV: 96.4 fL (ref 79.5–101.0)
MONO#: 0.4 10*3/uL (ref 0.1–0.9)
MONO%: 15.7 % — AB (ref 0.0–14.0)
NEUT%: 68.4 % (ref 38.4–76.8)
NEUTROS ABS: 1.6 10*3/uL (ref 1.5–6.5)
PLATELETS: 217 10*3/uL (ref 145–400)
RBC: 3.53 10*6/uL — AB (ref 3.70–5.45)
RDW: 15.3 % — ABNORMAL HIGH (ref 11.2–14.5)
WBC: 2.4 10*3/uL — AB (ref 3.9–10.3)
lymph#: 0.3 10*3/uL — ABNORMAL LOW (ref 0.9–3.3)

## 2016-11-12 LAB — COMPREHENSIVE METABOLIC PANEL
ALT: 32 U/L (ref 0–55)
ANION GAP: 10 meq/L (ref 3–11)
AST: 24 U/L (ref 5–34)
Albumin: 3.9 g/dL (ref 3.5–5.0)
Alkaline Phosphatase: 52 U/L (ref 40–150)
BUN: 11.4 mg/dL (ref 7.0–26.0)
CHLORIDE: 103 meq/L (ref 98–109)
CO2: 25 meq/L (ref 22–29)
CREATININE: 0.8 mg/dL (ref 0.6–1.1)
Calcium: 9.8 mg/dL (ref 8.4–10.4)
EGFR: >90 mL/min/{1.73_m2} (ref >90)
GLUCOSE: 156 mg/dL — AB (ref 70–140)
Potassium: 3.6 mEq/L (ref 3.5–5.1)
SODIUM: 139 meq/L (ref 136–145)
Total Bilirubin: 0.31 mg/dL (ref 0.20–1.20)
Total Protein: 7.4 g/dL (ref 6.4–8.3)

## 2016-11-12 MED ORDER — GOSERELIN ACETATE 3.6 MG ~~LOC~~ IMPL
3.6000 mg | DRUG_IMPLANT | Freq: Once | SUBCUTANEOUS | Status: AC
  Administered 2016-11-12: 3.6 mg via SUBCUTANEOUS
  Filled 2016-11-12: qty 3.6

## 2016-11-12 MED ORDER — GLUCOSE BLOOD VI STRP: ORAL_STRIP | 0 refills | Status: DC

## 2016-11-12 MED ORDER — ACCU-CHEK SOFTCLIX LANCETS MISC: 100.0000 | Freq: Four times a day (QID) | 0 refills | Status: AC

## 2016-11-12 MED ORDER — DENOSUMAB 120 MG/1.7ML ~~LOC~~ SOLN
120.0000 mg | Freq: Once | SUBCUTANEOUS | Status: AC
  Administered 2016-11-12: 120 mg via SUBCUTANEOUS
  Filled 2016-11-12: qty 1.7

## 2016-11-12 MED FILL — ACCU-CHEK SOFTCLIX LANCETS: 25 days supply | Qty: 100 | Fill #0

## 2016-11-12 NOTE — Patient Instructions (Signed)
Implanted Port Home Guide An implanted port is a type of central line that is placed under the skin. Central lines are used to provide IV access when treatment or nutrition needs to be given through a person's veins. Implanted ports are used for long-term IV access. An implanted port may be placed because:  You need IV medicine that would be irritating to the small veins in your hands or arms.  You need long-term IV medicines, such as antibiotics.  You need IV nutrition for a long period.  You need frequent blood draws for lab tests.  You need dialysis.  Implanted ports are usually placed in the chest area, but they can also be placed in the upper arm, the abdomen, or the leg. An implanted port has two main parts:  Reservoir. The reservoir is round and will appear as a small, raised area under your skin. The reservoir is the part where a needle is inserted to give medicines or draw blood.  Catheter. The catheter is a thin, flexible tube that extends from the reservoir. The catheter is placed into a large vein. Medicine that is inserted into the reservoir goes into the catheter and then into the vein.  How will I care for my incision site? Do not get the incision site wet. Bathe or shower as directed by your health care provider. How is my port accessed? Special steps must be taken to access the port:  Before the port is accessed, a numbing cream can be placed on the skin. This helps numb the skin over the port site.  Your health care provider uses a sterile technique to access the port. ? Your health care provider must put on a mask and sterile gloves. ? The skin over your port is cleaned carefully with an antiseptic and allowed to dry. ? The port is gently pinched between sterile gloves, and a needle is inserted into the port.  Only "non-coring" port needles should be used to access the port. Once the port is accessed, a blood return should be checked. This helps ensure that the port  is in the vein and is not clogged.  If your port needs to remain accessed for a constant infusion, a clear (transparent) bandage will be placed over the needle site. The bandage and needle will need to be changed every week, or as directed by your health care provider.  Keep the bandage covering the needle clean and dry. Do not get it wet. Follow your health care provider's instructions on how to take a shower or bath while the port is accessed.  If your port does not need to stay accessed, no bandage is needed over the port.  What is flushing? Flushing helps keep the port from getting clogged. Follow your health care provider's instructions on how and when to flush the port. Ports are usually flushed with saline solution or a medicine called heparin. The need for flushing will depend on how the port is used.  If the port is used for intermittent medicines or blood draws, the port will need to be flushed: ? After medicines have been given. ? After blood has been drawn. ? As part of routine maintenance.  If a constant infusion is running, the port may not need to be flushed.  How long will my port stay implanted? The port can stay in for as long as your health care provider thinks it is needed. When it is time for the port to come out, surgery will be   done to remove it. The procedure is similar to the one performed when the port was put in. When should I seek immediate medical care? When you have an implanted port, you should seek immediate medical care if:  You notice a bad smell coming from the incision site.  You have swelling, redness, or drainage at the incision site.  You have more swelling or pain at the port site or the surrounding area.  You have a fever that is not controlled with medicine.  This information is not intended to replace advice given to you by your health care provider. Make sure you discuss any questions you have with your health care provider. Document  Released: 04/09/2005 Document Revised: 09/15/2015 Document Reviewed: 12/15/2012 Elsevier Interactive Patient Education  2017 [employer]  

## 2016-11-12 NOTE — Patient Instructions (Signed)
Goserelin injection What is this medicine? GOSERELIN (GOE se rel in) is similar to a hormone found in the body. It lowers the amount of sex hormones that the body makes. Men will have lower testosterone levels and women will have lower estrogen levels while taking this medicine. In men, this medicine is used to treat prostate cancer; the injection is either given once per month or once every 12 weeks. A once per month injection (only) is used to treat women with endometriosis, dysfunctional uterine bleeding, or advanced breast cancer. This medicine may be used for other purposes; ask your health care provider or pharmacist if you have questions. COMMON BRAND NAME(S): Zoladex What should I tell my health care provider before I take this medicine? They need to know if you have any of these conditions (some only apply to women): -diabetes -heart disease or previous heart attack -high blood pressure -high cholesterol -kidney disease -osteoporosis or low bone density -problems passing urine -spinal cord injury -stroke -tobacco smoker -an unusual or allergic reaction to goserelin, hormone therapy, other medicines, foods, dyes, or preservatives -pregnant or trying to get pregnant -breast-feeding How should I use this medicine? This medicine is for injection under the skin. It is given by a health care professional in a hospital or clinic setting. Men receive this injection once every 4 weeks or once every 12 weeks. Women will only receive the once every 4 weeks injection. Talk to your pediatrician regarding the use of this medicine in children. Special care may be needed. Overdosage: If you think you have taken too much of this medicine contact a poison control center or emergency room at once. NOTE: This medicine is only for you. Do not share this medicine with others. What if I miss a dose? It is important not to miss your dose. Call your doctor or health care professional if you are unable to  keep an appointment. What may interact with this medicine? -female hormones like estrogen -herbal or dietary supplements like black cohosh, chasteberry, or DHEA -female hormones like testosterone -prasterone This list may not describe all possible interactions. Give your health care provider a list of all the medicines, herbs, non-prescription drugs, or dietary supplements you use. Also tell them if you smoke, drink alcohol, or use illegal drugs. Some items may interact with your medicine. What should I watch for while using this medicine? Visit your doctor or health care professional for regular checks on your progress. Your symptoms may appear to get worse during the first weeks of this therapy. Tell your doctor or healthcare professional if your symptoms do not start to get better or if they get worse after this time. Your bones may get weaker if you take this medicine for a long time. If you smoke or frequently drink alcohol you may increase your risk of bone loss. A family history of osteoporosis, chronic use of drugs for seizures (convulsions), or corticosteroids can also increase your risk of bone loss. Talk to your doctor about how to keep your bones strong. This medicine should stop regular monthly menstration in women. Tell your doctor if you continue to menstrate. Women should not become pregnant while taking this medicine or for 12 weeks after stopping this medicine. Women should inform their doctor if they wish to become pregnant or think they might be pregnant. There is a potential for serious side effects to an unborn child. Talk to your health care professional or pharmacist for more information. Do not breast-feed an infant while taking   this medicine. Men should inform their doctors if they wish to father a child. This medicine may lower sperm counts. Talk to your health care professional or pharmacist for more information. What side effects may I notice from receiving this  medicine? Side effects that you should report to your doctor or health care professional as soon as possible: -allergic reactions like skin rash, itching or hives, swelling of the face, lips, or tongue -bone pain -breathing problems -changes in vision -chest pain -feeling faint or lightheaded, falls -fever, chills -pain, swelling, warmth in the leg -pain, tingling, numbness in the hands or feet -signs and symptoms of low blood pressure like dizziness; feeling faint or lightheaded, falls; unusually weak or tired -stomach pain -swelling of the ankles, feet, hands -trouble passing urine or change in the amount of urine -unusually high or low blood pressure -unusually weak or tired Side effects that usually do not require medical attention (report to your doctor or health care professional if they continue or are bothersome): -change in sex drive or performance -changes in breast size in both males and females -changes in emotions or moods -headache -hot flashes -irritation at site where injected -loss of appetite -skin problems like acne, dry skin -vaginal dryness This list may not describe all possible side effects. Call your doctor for medical advice about side effects. You may report side effects to FDA at 1-800-FDA-1088. Where should I keep my medicine? This drug is given in a hospital or clinic and will not be stored at home. NOTE: This sheet is a summary. It may not cover all possible information. If you have questions about this medicine, talk to your doctor, pharmacist, or health care provider.  2017 Elsevier/Gold Standard (2013-06-16 11:10:35) Denosumab injection What is this medicine? DENOSUMAB (den oh sue mab) slows bone breakdown. Prolia is used to treat osteoporosis in women after menopause and in men. Delton See is used to prevent bone fractures and other bone problems caused by cancer bone metastases. Delton See is also used to treat giant cell tumor of the bone. COMMON BRAND  NAME(S): Prolia, XGEVA What should I tell my health care provider before I take this medicine? They need to know if you have any of these conditions: -dental disease -eczema -infection or history of infections -kidney disease or on dialysis -low blood calcium or vitamin D -malabsorption syndrome -scheduled to have surgery or tooth extraction -taking medicine that contains denosumab -thyroid or parathyroid disease -an unusual reaction to denosumab, other medicines, foods, dyes, or preservatives -pregnant or trying to get pregnant -breast-feeding How should I use this medicine? This medicine is for injection under the skin. It is given by a health care professional in a hospital or clinic setting. If you are getting Prolia, a special MedGuide will be given to you by the pharmacist with each prescription and refill. Be sure to read this information carefully each time. For Prolia, talk to your pediatrician regarding the use of this medicine in children. Special care may be needed. For Delton See, talk to your pediatrician regarding the use of this medicine in children. While this drug may be prescribed for children as young as 13 years for selected conditions, precautions do apply. What if I miss a dose? It is important not to miss your dose. Call your doctor or health care professional if you are unable to keep an appointment. What may interact with this medicine? Do not take this medicine with any of the following medications: -other medicines containing denosumab This medicine may also  interact with the following medications: -medicines that suppress the immune system -medicines that treat cancer -steroid medicines like prednisone or cortisone What should I watch for while using this medicine? Visit your doctor or health care professional for regular checks on your progress. Your doctor or health care professional may order blood tests and other tests to see how you are doing. Call your  doctor or health care professional if you get a cold or other infection while receiving this medicine. Do not treat yourself. This medicine may decrease your body's ability to fight infection. You should make sure you get enough calcium and vitamin D while you are taking this medicine, unless your doctor tells you not to. Discuss the foods you eat and the vitamins you take with your health care professional. See your dentist regularly. Brush and floss your teeth as directed. Before you have any dental work done, tell your dentist you are receiving this medicine. Do not become pregnant while taking this medicine or for 5 months after stopping it. Women should inform their doctor if they wish to become pregnant or think they might be pregnant. There is a potential for serious side effects to an unborn child. Talk to your health care professional or pharmacist for more information. What side effects may I notice from receiving this medicine? Side effects that you should report to your doctor or health care professional as soon as possible: -allergic reactions like skin rash, itching or hives, swelling of the face, lips, or tongue -breathing problems -chest pain -fast, irregular heartbeat -feeling faint or lightheaded, falls -fever, chills, or any other sign of infection -muscle spasms, tightening, or twitches -numbness or tingling -skin blisters or bumps, or is dry, peels, or red -slow healing or unexplained pain in the mouth or jaw -unusual bleeding or bruising Side effects that usually do not require medical attention (report to your doctor or health care professional if they continue or are bothersome): -muscle pain -stomach upset, gas Where should I keep my medicine? This medicine is only given in a clinic, doctor's office, or other health care setting and will not be stored at home.  2017 Elsevier/Gold Standard (2015-05-12 10:06:55)

## 2016-11-12 NOTE — Progress Notes (Signed)
Patient Care Team: System, Provider Not In as PCP - General [name], MD as Consulting Physician (General Surgery) [name], MD as Consulting Physician (Hematology and Oncology) [name], MD as Attending Physician (Radiation Oncology) Benson Norway, RN as Registered Nurse (Oncology) Sylvan Cheese, NP as Nurse Practitioner (Hematology and Oncology)  DIAGNOSIS:  Encounter Diagnoses  Name Primary?  . Malignant neoplasm of upper-inner quadrant of right breast in female, estrogen receptor positive ([place]) Yes  . Type 2 diabetes mellitus with complication, without long-term current use of insulin ([place])     SUMMARY OF ONCOLOGIC HISTORY:   Breast cancer of upper-inner quadrant of right female breast ([place])   01/12/2014 Mammogram    Right breast - two lesions: #1 2:00 position 1.6 x 1.9 cm and #2 12:00 position 0.5 x 0.9 cm. Distance between the 2 was 4.5 cm, right axillary lymph node enlargement      01/12/2014 Initial Biopsy    Right breast 3 biopsies: All were IDC with DCIS ER+ PR + Ki-67 85% HER-2 negative: Right axillary lymph node also positive for metastatic cancer ER positive HER-2 negative Ki-67 80% grade 2      01/18/2014 Breast MRI    Right breast 12:00 position 2.8 x 2 x 2.2 cm: 2:00 position 1.3 x 0.8 x 0.5 cm, right axilla multiple enlarged lymph nodes 1 cm in size, right retropectoral lymph node 0.6 cm      01/18/2014 Clinical Stage    Stage IIB T2 N1      01/25/2014 Procedure    Breast/Ovarian ([facility]) reveals no clinically significant variant at ATM, BARD1, BRCA1, BRCA2, BRIP1, CDH1, CHEK2, EPCAM, FANCC, MLH1, MSH2, MSH6, NBN, PALB2, PMS2, PTEN, RAD51C, RAD51D, TP53, and XRCC2.       02/11/2014 -  Neo-Adjuvant Chemotherapy    Doxorubicin and cyclophosphamide X 4 followed by Taxol weekly x12       07/12/2014 Breast MRI    Partial response to neoadjuvant chemotherapy right breast mass decreased from 2.8 cm to 2.3 cm, 2 small masses along the  medial margin also decreased; significant decrease right breast mass 11 to 12:00 and 13 mm to 8 mm, decrease in right axilla LN      08/23/2014 Definitive Surgery    Right lumpectomy/SLNB [name]) 2:00: IDC grade 3, 3.2 cm, high-grade DCIS, LV I present, PNI present,; 12:00: IDC grade 30.8 cm, high-grade DCIS, LV I, 4/4 lymph nodes positive with ECE, ER 90%, PR 40%, HER-2 negative margin positive      08/23/2014 Pathologic Stage    Stage IIIA: ypT2 ypN2a      09/15/2014 Surgery    Reexcision of margins: clear; no evidence of malignancy      11/03/2014 - 12/14/2014 Radiation Therapy    Adjuvant XRT Isidore Moos): Right Breast, supraclavicular nodes, axillary nodes, and internal mammary nodes: 50 Gy over 25 fractions; right breast boost: 10 Gy over 5 fractions. Total dose: 60 Gy      12/29/2014 -  Anti-estrogen oral therapy    Tamoxifen 20 mg daily      04/08/2015 Survivorship    Survivorship care plan completed and mailed to patient in lieu of in person visit at her request      12/31/2015 Relapse/Recurrence    Innumerable osseous metastases with acute pathologic fracture the L4 vertebral body. 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 L5-S1 protrusion with left more than right S1 impingement      01/05/2016 - 01/12/2016 Radiation Therapy  Palliative radiation to the spine for cord compression      01/13/2016 -  Anti-estrogen oral therapy    Ribociclib 3 weeks on and 1 week off and Letrozole (with Zolodex until oophorectomy) and Xgeva for bone mets       CHIEF COMPLIANT: Follow-up after recent CT scans and bone scans  INTERVAL HISTORY: Debbie Martinez is a 41 year old with above-mentioned history of metastatic breast cancer currently on Ribociclib with letrozole and Zoladex and Xgeva. She is tolerating Ribociclib extremely well. She does not have any significant hot flashes or myalgias or nausea and vomiting. So far her blood counts have been very stable. She  is able to walk without help.  REVIEW OF SYSTEMS:   Constitutional: Denies fevers, chills or abnormal weight loss Eyes: Denies blurriness of vision Ears, nose, mouth, throat, and face: Denies mucositis or sore throat Respiratory: Denies cough, dyspnea or wheezes Cardiovascular: Denies palpitation, chest discomfort Gastrointestinal:  Denies nausea, heartburn or change in bowel habits Skin: Denies abnormal skin rashes Lymphatics: Denies new lymphadenopathy or easy bruising Neurological:Denies numbness, tingling or new weaknesses Behavioral/Psych: Mood is stable, no new changes  Extremities: No lower extremity edema All other systems were reviewed with the patient and are negative.  I have reviewed the past medical history, past surgical history, social history and family history with the patient and they are unchanged from previous note.  ALLERGIES:  is allergic to lisinopril and tamoxifen.  MEDICATIONS:  Current Outpatient Prescriptions  Medication Sig Dispense Refill  . ACCU-CHEK AVIVA PLUS test strip USE AS DIRECTED 4 TIMES A DAY 100 each 0  . ACCU-CHEK SOFTCLIX LANCETS lancets 100 each by Other route 4 (four) times daily. Use as instructed 100 each 0  . atenolol (TENORMIN) 50 MG tablet Take 50 mg by mouth daily.    . blood glucose meter kit and supplies KIT Dispense based on patient and insurance preference. Use up to four times daily as directed. (FOR ICD-9 250.00, 250.01). 1 each 0  . Cholecalciferol (VITAMIN D-3 PO) Take 1 capsule by mouth daily.    . diazepam (VALIUM) 5 MG tablet Take 1 tablet (5 mg total) by mouth 2 (two) times daily. (Patient not taking: Reported on 03/14/2016) 10 tablet 0  . ENSURE (ENSURE) Take 237 mLs by mouth 2 (two) times daily between meals.     . gabapentin (NEURONTIN) 300 MG capsule Take 1 capsule (300 mg total) by mouth 3 (three) times daily. 90 capsule 5  . Iron-Vitamins (GERITOL) LIQD Take 5 mLs by mouth daily.    Marland Kitchen KISQALI 200 DOSE 200 MG TABS TAKE  3 TABLETS BY MOUTH DAILY. 3 WEEKS ON AND 1 WEEK OFF 63 tablet 6  . letrozole (FEMARA) 2.5 MG tablet Take 1 tablet (2.5 mg total) by mouth daily. 90 tablet 3  . Lidocaine-Prilocaine, Bulk, 2.5-2.5 % CREA Apply 5 mLs topically as needed. 2 hours before use, cover with plawtic wrap. 30 g prn  . Magnesium Salicylate (DOANS PILLS PO) Take 2 tablets by mouth daily as needed (for pain).    . metFORMIN (GLUCOPHAGE) 500 MG tablet Take 1 tablet (500 mg total) by mouth 2 (two) times daily with a meal. (Patient not taking: Reported on 05/30/2016) 60 tablet 0  . methocarbamol (ROBAXIN) 500 MG tablet Take 1 tablet (500 mg total) by mouth 2 (two) times daily as needed for muscle spasms. (Patient taking differently: Take 1,000 mg by mouth 4 (four) times daily as needed for muscle spasms. ) 20 tablet 0  .  omeprazole (PRILOSEC) 20 MG capsule Take 1 capsule (20 mg total) by mouth daily. Take while using dexamethasone to prevent heartburn. 30 capsule 2  . oxyCODONE-acetaminophen (PERCOCET/ROXICET) 5-325 MG tablet Take 1 tablet by mouth every 6 (six) hours as needed for severe pain. 120 tablet 0  . polyethylene glycol (MIRALAX / GLYCOLAX) packet Take 17 g by mouth daily. (Patient not taking: Reported on 05/30/2016) 14 each 0  . Ribociclib Succinate 200 MG TABS Take 600 mg by mouth daily. 3 weeks on 1 week off 63 tablet 6  . traMADol (ULTRAM) 50 MG tablet Take 50-100 mg by mouth every 6 (six) hours as needed (for pain).     No current facility-administered medications for this visit.    Facility-Administered Medications Ordered in Other Visits  Medication Dose Route Frequency Provider Last Rate Last Dose  . goserelin (ZOLADEX) injection 3.6 mg  3.6 mg Subcutaneous Q28 days [name], MD   3.6 mg at 01/27/16 1009  . goserelin (ZOLADEX) injection 3.6 mg  3.6 mg Subcutaneous Q28 days [name], MD   3.6 mg at 03/23/16 1122    PHYSICAL EXAMINATION: ECOG PERFORMANCE STATUS: 1 - Symptomatic but completely  ambulatory  Vitals:   11/12/16 1107  BP: (!) 162/101  Pulse: 97  Resp: 16  Temp: 98.6 F (37 C)   Filed Weights   11/12/16 1107  Weight: 182 lb 6.4 oz (82.7 kg)    GENERAL:alert, no distress and comfortable SKIN: skin color, texture, turgor are normal, no rashes or significant lesions EYES: normal, Conjunctiva are pink and non-injected, sclera clear OROPHARYNX:no exudate, no erythema and lips, buccal mucosa, and tongue normal  NECK: supple, thyroid normal size, non-tender, without nodularity LYMPH:  no palpable lymphadenopathy in the cervical, axillary or inguinal LUNGS: clear to auscultation and percussion with normal breathing effort HEART: regular rate & rhythm and no murmurs and no lower extremity edema ABDOMEN:abdomen soft, non-tender and normal bowel sounds MUSCULOSKELETAL:no cyanosis of digits and no clubbing  NEURO: alert & oriented x 3 with fluent speech, no focal motor/sensory deficits EXTREMITIES: No lower extremity edema BREAST: No palpable masses or nodules in either right or left breasts. No palpable axillary supraclavicular or infraclavicular adenopathy no breast tenderness or nipple discharge. (exam performed in the presence of a chaperone)  LABORATORY DATA:  I have reviewed the data as listed   Chemistry      Component Value Date/Time   NA 139 11/12/2016 1052   K 3.6 11/12/2016 1052   CL 98 (L) 01/04/2016 0504   CO2 25 11/12/2016 1052   BUN 11.4 11/12/2016 1052   CREATININE 0.8 11/12/2016 1052      Component Value Date/Time   CALCIUM 9.8 11/12/2016 1052   ALKPHOS 52 11/12/2016 1052   AST 24 11/12/2016 1052   ALT 32 11/12/2016 1052   BILITOT 0.31 11/12/2016 1052       Lab Results  Component Value Date   WBC 2.4 (L) 11/12/2016   HGB 11.1 (L) 11/12/2016   HCT 34.0 (L) 11/12/2016   MCV 96.4 11/12/2016   PLT 217 11/12/2016   NEUTROABS 1.6 11/12/2016    ASSESSMENT & PLAN:  Breast cancer of upper-inner quadrant of right female breast  ([facility]) 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   Treatment summary:  1. Patient completed neoadjuvant dose dense Adriamycin and Cytoxan and weekly Taxol X 12 started 02/09/2014 and completed 07/01/14  2. Right double lumpectomy 2:00: IDC grade 3, 3.2 cm, high-grade DCIS, LV I present, PNI present,; 12:00: IDC grade 3; 0.8 cm, high-grade DCIS, LVI, 4/4 lymph nodes positive with ECE, ER 90%, PR 40%, HER-2 negative margin positive T2 N2 M0 stage IIIa 3. Patient is on [facility] clinical trial and she was randomized intraoperatively for NO lymph node dissection. 4.Tamoxifen 20 mg daily started 12/29/2014 stopped July 2016 for adverse effects 5. 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 Palliative XRT to the spine for cord compression --------------------------------------------------------------------------------------------------------------------------------------------------------- Current treatment: Ribociclibwith letrozole plus ovarian suppression started 01/13/2016 Bone Mets: Xgeva with calcium and vitamin D Ribociclib toxicities:  1.Fatigue 2. Neutropenia ANC Today is 1.6 continue with the same dosage   11/08/2016: CT chest abdomen pelvis and bone scan: Stable disease no soft tissue metastases. Bone metastases unchanged from before.  Plan: Every 3 month lab work and follow-up every 6 month scans and follow-up.    I spent 25 minutes talking to the patient of which more than half was spent in counseling and coordination of care.  Orders Placed This Encounter  Procedures  . CBC with Differential    Standing Status:   Future    Standing Expiration Date:   11/12/2017  . Comprehensive metabolic panel    Standing Status:   Future    Standing  Expiration Date:   11/12/2017   The patient has a good understanding of the overall plan. she agrees with it. she will call with any problems that may develop before the next visit here.   Rulon Eisenmenger, MD 11/12/16

## 2016-11-12 NOTE — Assessment & Plan Note (Signed)
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   Treatment summary:  1. Patient completed neoadjuvant dose dense Adriamycin and Cytoxan and weekly Taxol X 12 started 02/09/2014 and completed 07/01/14 2. Right double lumpectomy 2:00: IDC grade 3, 3.2 cm, high-grade DCIS, LV I present, PNI present,; 12:00: IDC grade 3; 0.8 cm, high-grade DCIS, LVI, 4/4 lymph nodes positive with ECE, ER 90%, PR 40%, HER-2 negative margin positive T2 N2 M0 stage IIIa 3. Patient is on [facility] clinical trial and she was randomized intraoperatively for NO lymph node dissection. 4.Tamoxifen 20 mg daily started 12/29/2014 stopped July 2016 for adverse effects 5. 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 Palliative XRT to the spine for cord compression --------------------------------------------------------------------------------------------------------------------------------------------------------- Current treatment: Ribociclibwith letrozole plus ovarian suppression started 01/13/2016 Bone Mets: Xgeva with calcium and vitamin D Ribociclib toxicities:  1.Fatigue 2. Neutropenia ANC Today is 1.8 continue with the same dosage   11/08/2016: CT chest abdomen pelvis and bone scan: Stable disease no soft tissue metastases. Bone metastases unchanged from before.  Plan: Every 3 month lab work and follow-up every 6 month scans and follow-up.

## 2016-11-22 ENCOUNTER — Other Ambulatory Visit: Payer: Medicaid Other

## 2016-11-22 ENCOUNTER — Ambulatory Visit: Payer: Medicaid Other | Admitting: Hematology and Oncology

## 2016-11-30 ENCOUNTER — Other Ambulatory Visit: Payer: Self-pay

## 2016-11-30 DIAGNOSIS — C50211 Malignant neoplasm of upper-inner quadrant of right female breast: Secondary | ICD-10-CM

## 2016-11-30 DIAGNOSIS — Z170 Estrogen receptor positive status [ER+]: Principal | ICD-10-CM

## 2016-12-03 MED FILL — LETROZOLE 2.5 MG TABLET: 2.5 | 30 days supply | Qty: 30 | Fill #9

## 2016-12-03 MED FILL — KISQALI 200 MG DAILY DOSE: 200 | 28 days supply | Qty: 63 | Fill #3

## 2016-12-10 ENCOUNTER — Other Ambulatory Visit (HOSPITAL_BASED_OUTPATIENT_CLINIC_OR_DEPARTMENT_OTHER): Payer: Medicare Other

## 2016-12-10 ENCOUNTER — Ambulatory Visit (HOSPITAL_BASED_OUTPATIENT_CLINIC_OR_DEPARTMENT_OTHER): Payer: Medicare Other

## 2016-12-10 ENCOUNTER — Ambulatory Visit: Payer: Medicaid Other | Admitting: Hematology and Oncology

## 2016-12-10 ENCOUNTER — Other Ambulatory Visit: Payer: Medicaid Other

## 2016-12-10 ENCOUNTER — Ambulatory Visit: Payer: Medicaid Other

## 2016-12-10 VITALS — BP 132/92 | HR 88 | Temp 97.3°F | Resp 18

## 2016-12-10 DIAGNOSIS — C7951 Secondary malignant neoplasm of bone: Secondary | ICD-10-CM | POA: Diagnosis not present

## 2016-12-10 DIAGNOSIS — C50211 Malignant neoplasm of upper-inner quadrant of right female breast: Secondary | ICD-10-CM

## 2016-12-10 DIAGNOSIS — Z5111 Encounter for antineoplastic chemotherapy: Secondary | ICD-10-CM

## 2016-12-10 DIAGNOSIS — C50911 Malignant neoplasm of unspecified site of right female breast: Secondary | ICD-10-CM

## 2016-12-10 DIAGNOSIS — Z170 Estrogen receptor positive status [ER+]: Principal | ICD-10-CM

## 2016-12-10 LAB — COMPREHENSIVE METABOLIC PANEL
ALT: 31 U/L (ref 0–55)
AST: 20 U/L (ref 5–34)
Albumin: 3.7 g/dL (ref 3.5–5.0)
Alkaline Phosphatase: 49 U/L (ref 40–150)
Anion Gap: 9 mEq/L (ref 3–11)
BUN: 12.6 mg/dL (ref 7.0–26.0)
CALCIUM: 9.6 mg/dL (ref 8.4–10.4)
CHLORIDE: 104 meq/L (ref 98–109)
CO2: 28 mEq/L (ref 22–29)
CREATININE: 0.9 mg/dL (ref 0.6–1.1)
Glucose: 185 mg/dl — ABNORMAL HIGH (ref 70–140)
Potassium: 3.5 mEq/L (ref 3.5–5.1)
Sodium: 140 mEq/L (ref 136–145)
Total Bilirubin: 0.37 mg/dL (ref 0.20–1.20)
Total Protein: 7.2 g/dL (ref 6.4–8.3)

## 2016-12-10 LAB — CBC WITH DIFFERENTIAL/PLATELET
BASO%: 0.8 % (ref 0.0–2.0)
Basophils Absolute: 0 10*3/uL (ref 0.0–0.1)
EOS ABS: 0 10*3/uL (ref 0.0–0.5)
EOS%: 1.2 % (ref 0.0–7.0)
HEMATOCRIT: 32.7 % — AB (ref 34.8–46.6)
HGB: 10.4 g/dL — ABNORMAL LOW (ref 11.6–15.9)
LYMPH#: 0.5 10*3/uL — AB (ref 0.9–3.3)
LYMPH%: 21.8 % (ref 14.0–49.7)
MCH: 31.1 pg (ref 25.1–34.0)
MCHC: 31.8 g/dL (ref 31.5–36.0)
MCV: 97.9 fL (ref 79.5–101.0)
MONO#: 0.3 10*3/uL (ref 0.1–0.9)
MONO%: 12.5 % (ref 0.0–14.0)
NEUT#: 1.6 10*3/uL (ref 1.5–6.5)
NEUT%: 63.7 % (ref 38.4–76.8)
PLATELETS: 210 10*3/uL (ref 145–400)
RBC: 3.34 10*6/uL — ABNORMAL LOW (ref 3.70–5.45)
RDW: 14.8 % — ABNORMAL HIGH (ref 11.2–14.5)
WBC: 2.5 10*3/uL — ABNORMAL LOW (ref 3.9–10.3)

## 2016-12-10 MED ORDER — DENOSUMAB 120 MG/1.7ML ~~LOC~~ SOLN
120.0000 mg | Freq: Once | SUBCUTANEOUS | Status: AC
  Administered 2016-12-10: 120 mg via SUBCUTANEOUS
  Filled 2016-12-10: qty 1.7

## 2016-12-10 MED ORDER — GOSERELIN ACETATE 3.6 MG ~~LOC~~ IMPL
3.6000 mg | DRUG_IMPLANT | Freq: Once | SUBCUTANEOUS | Status: AC
  Administered 2016-12-10: 3.6 mg via SUBCUTANEOUS
  Filled 2016-12-10: qty 3.6

## 2016-12-10 NOTE — Patient Instructions (Signed)
Goserelin injection What is this medicine? GOSERELIN (GOE se rel in) is similar to a hormone found in the body. It lowers the amount of sex hormones that the body makes. Men will have lower testosterone levels and women will have lower estrogen levels while taking this medicine. In men, this medicine is used to treat prostate cancer; the injection is either given once per month or once every 12 weeks. A once per month injection (only) is used to treat women with endometriosis, dysfunctional uterine bleeding, or advanced breast cancer. This medicine may be used for other purposes; ask your health care provider or pharmacist if you have questions. COMMON BRAND NAME(S): Zoladex What should I tell my health care provider before I take this medicine? They need to know if you have any of these conditions (some only apply to women): -diabetes -heart disease or previous heart attack -high blood pressure -high cholesterol -kidney disease -osteoporosis or low bone density -problems passing urine -spinal cord injury -stroke -tobacco smoker -an unusual or allergic reaction to goserelin, hormone therapy, other medicines, foods, dyes, or preservatives -pregnant or trying to get pregnant -breast-feeding How should I use this medicine? This medicine is for injection under the skin. It is given by a health care professional in a hospital or clinic setting. Men receive this injection once every 4 weeks or once every 12 weeks. Women will only receive the once every 4 weeks injection. Talk to your pediatrician regarding the use of this medicine in children. Special care may be needed. Overdosage: If you think you have taken too much of this medicine contact a poison control center or emergency room at once. NOTE: This medicine is only for you. Do not share this medicine with others. What if I miss a dose? It is important not to miss your dose. Call your doctor or health care professional if you are unable to  keep an appointment. What may interact with this medicine? -female hormones like estrogen -herbal or dietary supplements like black cohosh, chasteberry, or DHEA -female hormones like testosterone -prasterone This list may not describe all possible interactions. Give your health care provider a list of all the medicines, herbs, non-prescription drugs, or dietary supplements you use. Also tell them if you smoke, drink alcohol, or use illegal drugs. Some items may interact with your medicine. What should I watch for while using this medicine? Visit your doctor or health care professional for regular checks on your progress. Your symptoms may appear to get worse during the first weeks of this therapy. Tell your doctor or healthcare professional if your symptoms do not start to get better or if they get worse after this time. Your bones may get weaker if you take this medicine for a long time. If you smoke or frequently drink alcohol you may increase your risk of bone loss. A family history of osteoporosis, chronic use of drugs for seizures (convulsions), or corticosteroids can also increase your risk of bone loss. Talk to your doctor about how to keep your bones strong. This medicine should stop regular monthly menstration in women. Tell your doctor if you continue to menstrate. Women should not become pregnant while taking this medicine or for 12 weeks after stopping this medicine. Women should inform their doctor if they wish to become pregnant or think they might be pregnant. There is a potential for serious side effects to an unborn child. Talk to your health care professional or pharmacist for more information. Do not breast-feed an infant while taking   this medicine. Men should inform their doctors if they wish to father a child. This medicine may lower sperm counts. Talk to your health care professional or pharmacist for more information. What side effects may I notice from receiving this  medicine? Side effects that you should report to your doctor or health care professional as soon as possible: -allergic reactions like skin rash, itching or hives, swelling of the face, lips, or tongue -bone pain -breathing problems -changes in vision -chest pain -feeling faint or lightheaded, falls -fever, chills -pain, swelling, warmth in the leg -pain, tingling, numbness in the hands or feet -signs and symptoms of low blood pressure like dizziness; feeling faint or lightheaded, falls; unusually weak or tired -stomach pain -swelling of the ankles, feet, hands -trouble passing urine or change in the amount of urine -unusually high or low blood pressure -unusually weak or tired Side effects that usually do not require medical attention (report to your doctor or health care professional if they continue or are bothersome): -change in sex drive or performance -changes in breast size in both males and females -changes in emotions or moods -headache -hot flashes -irritation at site where injected -loss of appetite -skin problems like acne, dry skin -vaginal dryness This list may not describe all possible side effects. Call your doctor for medical advice about side effects. You may report side effects to FDA at 1-800-FDA-1088. Where should I keep my medicine? This drug is given in a hospital or clinic and will not be stored at home. NOTE: This sheet is a summary. It may not cover all possible information. If you have questions about this medicine, talk to your doctor, pharmacist, or health care provider.  2017 Elsevier/Gold Standard (2013-06-16 11:10:35) Denosumab injection What is this medicine? DENOSUMAB (den oh sue mab) slows bone breakdown. Prolia is used to treat osteoporosis in women after menopause and in men. Delton See is used to prevent bone fractures and other bone problems caused by cancer bone metastases. Delton See is also used to treat giant cell tumor of the bone. COMMON BRAND  NAME(S): Prolia, XGEVA What should I tell my health care provider before I take this medicine? They need to know if you have any of these conditions: -dental disease -eczema -infection or history of infections -kidney disease or on dialysis -low blood calcium or vitamin D -malabsorption syndrome -scheduled to have surgery or tooth extraction -taking medicine that contains denosumab -thyroid or parathyroid disease -an unusual reaction to denosumab, other medicines, foods, dyes, or preservatives -pregnant or trying to get pregnant -breast-feeding How should I use this medicine? This medicine is for injection under the skin. It is given by a health care professional in a hospital or clinic setting. If you are getting Prolia, a special MedGuide will be given to you by the pharmacist with each prescription and refill. Be sure to read this information carefully each time. For Prolia, talk to your pediatrician regarding the use of this medicine in children. Special care may be needed. For Delton See, talk to your pediatrician regarding the use of this medicine in children. While this drug may be prescribed for children as young as 13 years for selected conditions, precautions do apply. What if I miss a dose? It is important not to miss your dose. Call your doctor or health care professional if you are unable to keep an appointment. What may interact with this medicine? Do not take this medicine with any of the following medications: -other medicines containing denosumab This medicine may also  interact with the following medications: -medicines that suppress the immune system -medicines that treat cancer -steroid medicines like prednisone or cortisone What should I watch for while using this medicine? Visit your doctor or health care professional for regular checks on your progress. Your doctor or health care professional may order blood tests and other tests to see how you are doing. Call your  doctor or health care professional if you get a cold or other infection while receiving this medicine. Do not treat yourself. This medicine may decrease your body's ability to fight infection. You should make sure you get enough calcium and vitamin D while you are taking this medicine, unless your doctor tells you not to. Discuss the foods you eat and the vitamins you take with your health care professional. See your dentist regularly. Brush and floss your teeth as directed. Before you have any dental work done, tell your dentist you are receiving this medicine. Do not become pregnant while taking this medicine or for 5 months after stopping it. Women should inform their doctor if they wish to become pregnant or think they might be pregnant. There is a potential for serious side effects to an unborn child. Talk to your health care professional or pharmacist for more information. What side effects may I notice from receiving this medicine? Side effects that you should report to your doctor or health care professional as soon as possible: -allergic reactions like skin rash, itching or hives, swelling of the face, lips, or tongue -breathing problems -chest pain -fast, irregular heartbeat -feeling faint or lightheaded, falls -fever, chills, or any other sign of infection -muscle spasms, tightening, or twitches -numbness or tingling -skin blisters or bumps, or is dry, peels, or red -slow healing or unexplained pain in the mouth or jaw -unusual bleeding or bruising Side effects that usually do not require medical attention (report to your doctor or health care professional if they continue or are bothersome): -muscle pain -stomach upset, gas Where should I keep my medicine? This medicine is only given in a clinic, doctor's office, or other health care setting and will not be stored at home.  2017 Elsevier/Gold Standard (2015-05-12 10:06:55)

## 2016-12-25 ENCOUNTER — Ambulatory Visit (HOSPITAL_BASED_OUTPATIENT_CLINIC_OR_DEPARTMENT_OTHER): Payer: Medicare Other

## 2016-12-25 DIAGNOSIS — C50211 Malignant neoplasm of upper-inner quadrant of right female breast: Secondary | ICD-10-CM

## 2016-12-25 DIAGNOSIS — Z452 Encounter for adjustment and management of vascular access device: Secondary | ICD-10-CM | POA: Diagnosis not present

## 2016-12-25 DIAGNOSIS — C50911 Malignant neoplasm of unspecified site of right female breast: Secondary | ICD-10-CM

## 2016-12-25 DIAGNOSIS — Z95828 Presence of other vascular implants and grafts: Secondary | ICD-10-CM

## 2016-12-25 MED ORDER — HEPARIN SOD (PORK) LOCK FLUSH 100 UNIT/ML IV SOLN
500.0000 [IU] | Freq: Once | INTRAVENOUS | Status: AC
  Administered 2016-12-25: 500 [IU]
  Filled 2016-12-25: qty 5

## 2016-12-25 MED ORDER — SODIUM CHLORIDE 0.9% FLUSH
10.0000 mL | Freq: Once | INTRAVENOUS | Status: AC
  Administered 2016-12-25: 10 mL
  Filled 2016-12-25: qty 10

## 2016-12-27 ENCOUNTER — Other Ambulatory Visit: Payer: Self-pay | Admitting: Hematology and Oncology

## 2016-12-27 MED FILL — LETROZOLE 2.5 MG TABLET: 2.5 | 30 days supply | Qty: 30 | Fill #10

## 2016-12-27 MED FILL — KISQALI 200 MG DAILY DOSE: 200 | 28 days supply | Qty: 63 | Fill #4

## 2016-12-31 ENCOUNTER — Telehealth: Payer: Self-pay

## 2016-12-31 ENCOUNTER — Other Ambulatory Visit: Payer: Self-pay

## 2016-12-31 NOTE — Telephone Encounter (Signed)
Called pt and lvm with call back number to let her know of lab appt prior to injection on 9/17.

## 2017-01-07 ENCOUNTER — Ambulatory Visit (HOSPITAL_BASED_OUTPATIENT_CLINIC_OR_DEPARTMENT_OTHER): Payer: Medicare Other

## 2017-01-07 ENCOUNTER — Other Ambulatory Visit (HOSPITAL_BASED_OUTPATIENT_CLINIC_OR_DEPARTMENT_OTHER): Payer: Medicare Other

## 2017-01-07 ENCOUNTER — Other Ambulatory Visit: Payer: Self-pay

## 2017-01-07 DIAGNOSIS — C50211 Malignant neoplasm of upper-inner quadrant of right female breast: Secondary | ICD-10-CM

## 2017-01-07 DIAGNOSIS — Z95828 Presence of other vascular implants and grafts: Secondary | ICD-10-CM

## 2017-01-07 DIAGNOSIS — C7951 Secondary malignant neoplasm of bone: Secondary | ICD-10-CM | POA: Diagnosis not present

## 2017-01-07 DIAGNOSIS — Z5111 Encounter for antineoplastic chemotherapy: Secondary | ICD-10-CM | POA: Diagnosis present

## 2017-01-07 DIAGNOSIS — Z170 Estrogen receptor positive status [ER+]: Principal | ICD-10-CM

## 2017-01-07 DIAGNOSIS — C50911 Malignant neoplasm of unspecified site of right female breast: Secondary | ICD-10-CM

## 2017-01-07 LAB — COMPREHENSIVE METABOLIC PANEL
ALK PHOS: 46 U/L (ref 40–150)
ALT: 18 U/L (ref 0–55)
ANION GAP: 8 meq/L (ref 3–11)
AST: 16 U/L (ref 5–34)
Albumin: 3.8 g/dL (ref 3.5–5.0)
BILIRUBIN TOTAL: 0.33 mg/dL (ref 0.20–1.20)
BUN: 8.4 mg/dL (ref 7.0–26.0)
CO2: 27 meq/L (ref 22–29)
Calcium: 9.3 mg/dL (ref 8.4–10.4)
Chloride: 106 mEq/L (ref 98–109)
Creatinine: 0.8 mg/dL (ref 0.6–1.1)
Glucose: 170 mg/dl — ABNORMAL HIGH (ref 70–140)
POTASSIUM: 3.5 meq/L (ref 3.5–5.1)
SODIUM: 141 meq/L (ref 136–145)
Total Protein: 7.1 g/dL (ref 6.4–8.3)

## 2017-01-07 LAB — CBC WITH DIFFERENTIAL/PLATELET
BASO%: 1 % (ref 0.0–2.0)
BASOS ABS: 0 10*3/uL (ref 0.0–0.1)
EOS%: 1 % (ref 0.0–7.0)
Eosinophils Absolute: 0 10*3/uL (ref 0.0–0.5)
HCT: 31.9 % — ABNORMAL LOW (ref 34.8–46.6)
HGB: 10.1 g/dL — ABNORMAL LOW (ref 11.6–15.9)
LYMPH%: 21.6 % (ref 14.0–49.7)
MCH: 30.9 pg (ref 25.1–34.0)
MCHC: 31.7 g/dL (ref 31.5–36.0)
MCV: 97.6 fL (ref 79.5–101.0)
MONO#: 0.4 10*3/uL (ref 0.1–0.9)
MONO%: 19.2 % — AB (ref 0.0–14.0)
NEUT%: 57.2 % (ref 38.4–76.8)
NEUTROS ABS: 1.2 10*3/uL — AB (ref 1.5–6.5)
PLATELETS: 200 10*3/uL (ref 145–400)
RBC: 3.27 10*6/uL — AB (ref 3.70–5.45)
RDW: 14.5 % (ref 11.2–14.5)
WBC: 2.1 10*3/uL — ABNORMAL LOW (ref 3.9–10.3)
lymph#: 0.5 10*3/uL — ABNORMAL LOW (ref 0.9–3.3)

## 2017-01-07 MED ORDER — GOSERELIN ACETATE 3.6 MG ~~LOC~~ IMPL
3.6000 mg | DRUG_IMPLANT | Freq: Once | SUBCUTANEOUS | Status: AC
  Administered 2017-01-07: 3.6 mg via SUBCUTANEOUS
  Filled 2017-01-07: qty 3.6

## 2017-01-07 MED ORDER — DENOSUMAB 120 MG/1.7ML ~~LOC~~ SOLN
120.0000 mg | Freq: Once | SUBCUTANEOUS | Status: AC
  Administered 2017-01-07: 120 mg via SUBCUTANEOUS
  Filled 2017-01-07: qty 1.7

## 2017-01-07 NOTE — Patient Instructions (Signed)
Denosumab injection What is this medicine? DENOSUMAB (den oh sue mab) slows bone breakdown. Prolia is used to treat osteoporosis in women after menopause and in men. Delton See is used to treat a high calcium level due to cancer and to prevent bone fractures and other bone problems caused by multiple myeloma or cancer bone metastases. Delton See is also used to treat giant cell tumor of the bone. This medicine may be used for other purposes; ask your health care provider or pharmacist if you have questions. COMMON BRAND NAME(S): Prolia, XGEVA What should I tell my health care provider before I take this medicine? They need to know if you have any of these conditions: -dental disease -having surgery or tooth extraction -infection -kidney disease -low levels of calcium or Vitamin D in the blood -malnutrition -on hemodialysis -skin conditions or sensitivity -thyroid or parathyroid disease -an unusual reaction to denosumab, other medicines, foods, dyes, or preservatives -pregnant or trying to get pregnant -breast-feeding How should I use this medicine? This medicine is for injection under the skin. It is given by a health care professional in a hospital or clinic setting. If you are getting Prolia, a special MedGuide will be given to you by the pharmacist with each prescription and refill. Be sure to read this information carefully each time. For Prolia, talk to your pediatrician regarding the use of this medicine in children. Special care may be needed. For Delton See, talk to your pediatrician regarding the use of this medicine in children. While this drug may be prescribed for children as young as 13 years for selected conditions, precautions do apply. Overdosage: If you think you have taken too much of this medicine contact a poison control center or emergency room at once. NOTE: This medicine is only for you. Do not share this medicine with others. What if I miss a dose? It is important not to miss your  dose. Call your doctor or health care professional if you are unable to keep an appointment. What may interact with this medicine? Do not take this medicine with any of the following medications: -other medicines containing denosumab This medicine may also interact with the following medications: -medicines that lower your chance of fighting infection -steroid medicines like prednisone or cortisone This list may not describe all possible interactions. Give your health care provider a list of all the medicines, herbs, non-prescription drugs, or dietary supplements you use. Also tell them if you smoke, drink alcohol, or use illegal drugs. Some items may interact with your medicine. What should I watch for while using this medicine? Visit your doctor or health care professional for regular checks on your progress. Your doctor or health care professional may order blood tests and other tests to see how you are doing. Call your doctor or health care professional for advice if you get a fever, chills or sore throat, or other symptoms of a cold or flu. Do not treat yourself. This drug may decrease your body's ability to fight infection. Try to avoid being around people who are sick. You should make sure you get enough calcium and vitamin D while you are taking this medicine, unless your doctor tells you not to. Discuss the foods you eat and the vitamins you take with your health care professional. See your dentist regularly. Brush and floss your teeth as directed. Before you have any dental work done, tell your dentist you are receiving this medicine. Do not become pregnant while taking this medicine or for 5 months after stopping  it. Talk with your doctor or health care professional about your birth control options while taking this medicine. Women should inform their doctor if they wish to become pregnant or think they might be pregnant. There is a potential for serious side effects to an unborn child. Talk  to your health care professional or pharmacist for more information. What side effects may I notice from receiving this medicine? Side effects that you should report to your doctor or health care professional as soon as possible: -allergic reactions like skin rash, itching or hives, swelling of the face, lips, or tongue -bone pain -breathing problems -dizziness -jaw pain, especially after dental work -redness, blistering, peeling of the skin -signs and symptoms of infection like fever or chills; cough; sore throat; pain or trouble passing urine -signs of low calcium like fast heartbeat, muscle cramps or muscle pain; pain, tingling, numbness in the hands or feet; seizures -unusual bleeding or bruising -unusually weak or tired Side effects that usually do not require medical attention (report to your doctor or health care professional if they continue or are bothersome): -constipation -diarrhea -headache -joint pain -loss of appetite -muscle pain -runny nose -tiredness -upset stomach This list may not describe all possible side effects. Call your doctor for medical advice about side effects. You may report side effects to FDA at 1-800-FDA-1088. Where should I keep my medicine? This medicine is only given in a clinic, doctor's office, or other health care setting and will not be stored at home. NOTE: This sheet is a summary. It may not cover all possible information. If you have questions about this medicine, talk to your doctor, pharmacist, or health care provider.  2018 Elsevier/Gold Standard (2016-05-01 19:17:21) Goserelin injection What is this medicine? GOSERELIN (GOE se rel in) is similar to a hormone found in the body. It lowers the amount of sex hormones that the body makes. Men will have lower testosterone levels and women will have lower estrogen levels while taking this medicine. In men, this medicine is used to treat prostate cancer; the injection is either given once per  month or once every 12 weeks. A once per month injection (only) is used to treat women with endometriosis, dysfunctional uterine bleeding, or advanced breast cancer. This medicine may be used for other purposes; ask your health care provider or pharmacist if you have questions. COMMON BRAND NAME(S): Zoladex What should I tell my health care provider before I take this medicine? They need to know if you have any of these conditions (some only apply to women): -diabetes -heart disease or previous heart attack -high blood pressure -high cholesterol -kidney disease -osteoporosis or low bone density -problems passing urine -spinal cord injury -stroke -tobacco smoker -an unusual or allergic reaction to goserelin, hormone therapy, other medicines, foods, dyes, or preservatives -pregnant or trying to get pregnant -breast-feeding How should I use this medicine? This medicine is for injection under the skin. It is given by a health care professional in a hospital or clinic setting. Men receive this injection once every 4 weeks or once every 12 weeks. Women will only receive the once every 4 weeks injection. Talk to your pediatrician regarding the use of this medicine in children. Special care may be needed. Overdosage: If you think you have taken too much of this medicine contact a poison control center or emergency room at once. NOTE: This medicine is only for you. Do not share this medicine with others. What if I miss a dose? It is important not  to miss your dose. Call your doctor or health care professional if you are unable to keep an appointment. What may interact with this medicine? -female hormones like estrogen -herbal or dietary supplements like black cohosh, chasteberry, or DHEA -female hormones like testosterone -prasterone This list may not describe all possible interactions. Give your health care provider a list of all the medicines, herbs, non-prescription drugs, or dietary  supplements you use. Also tell them if you smoke, drink alcohol, or use illegal drugs. Some items may interact with your medicine. What should I watch for while using this medicine? Visit your doctor or health care professional for regular checks on your progress. Your symptoms may appear to get worse during the first weeks of this therapy. Tell your doctor or healthcare professional if your symptoms do not start to get better or if they get worse after this time. Your bones may get weaker if you take this medicine for a long time. If you smoke or frequently drink alcohol you may increase your risk of bone loss. A family history of osteoporosis, chronic use of drugs for seizures (convulsions), or corticosteroids can also increase your risk of bone loss. Talk to your doctor about how to keep your bones strong. This medicine should stop regular monthly menstration in women. Tell your doctor if you continue to [facility]. Women should not become pregnant while taking this medicine or for 12 weeks after stopping this medicine. Women should inform their doctor if they wish to become pregnant or think they might be pregnant. There is a potential for serious side effects to an unborn child. Talk to your health care professional or pharmacist for more information. Do not breast-feed an infant while taking this medicine. Men should inform their doctors if they wish to father a child. This medicine may lower sperm counts. Talk to your health care professional or pharmacist for more information. What side effects may I notice from receiving this medicine? Side effects that you should report to your doctor or health care professional as soon as possible: -allergic reactions like skin rash, itching or hives, swelling of the face, lips, or tongue -bone pain -breathing problems -changes in vision -chest pain -feeling faint or lightheaded, falls -fever, chills -pain, swelling, warmth in the leg -pain, tingling,  numbness in the hands or feet -signs and symptoms of low blood pressure like dizziness; feeling faint or lightheaded, falls; unusually weak or tired -stomach pain -swelling of the ankles, feet, hands -trouble passing urine or change in the amount of urine -unusually high or low blood pressure -unusually weak or tired Side effects that usually do not require medical attention (report to your doctor or health care professional if they continue or are bothersome): -change in sex drive or performance -changes in breast size in both males and females -changes in emotions or moods -headache -hot flashes -irritation at site where injected -loss of appetite -skin problems like acne, dry skin -vaginal dryness This list may not describe all possible side effects. Call your doctor for medical advice about side effects. You may report side effects to FDA at 1-800-FDA-1088. Where should I keep my medicine? This drug is given in a hospital or clinic and will not be stored at home. NOTE: This sheet is a summary. It may not cover all possible information. If you have questions about this medicine, talk to your doctor, pharmacist, or health care provider.  2018 Elsevier/Gold Standard (2013-06-16 11:10:35)

## 2017-02-01 ENCOUNTER — Encounter: Payer: Self-pay | Admitting: Pharmacist

## 2017-02-01 ENCOUNTER — Telehealth: Payer: Self-pay | Admitting: Pharmacy Technician

## 2017-02-01 ENCOUNTER — Telehealth: Payer: Self-pay | Admitting: *Deleted

## 2017-02-01 NOTE — Telephone Encounter (Signed)
Oral Oncology Patient Advocate Encounter  Debbie Martinez was covered by [employer] in September.  The October fill is being rejected at the pharmacy for coverage terminated.  I contacted the [employer] plan..they state that the patient's coverage ended on 01-20-17.  I also followed up with [place] Medicaid...their records show that the patient remains active with the [facility] plan and medicaid will not cover the prescription.  I have informed [name] of the situation and instructed her to contact her case worker to work to get this problem resolved.    The clinic has provided her with a month's worth of samples while this problem is being resolved.    I advised her to reach out to me if there is anything that I can do to help in the process.    Fabio Asa. Melynda Keller, [place] Patient [place] [phone] 02/01/2017 2:29 PM

## 2017-02-01 NOTE — Telephone Encounter (Signed)
Caller asking to speak with Debbie Martinez who is not in office today.  "She called me yesterday about Kisqali samples due to insurance changes and coverage." Pharmacy instructions to ask front desk registration to call [name] upon arrival.  Sample will be brought to her upon arrival.  No further questions.

## 2017-02-01 NOTE — Progress Notes (Signed)
Oral Chemotherapy Pharmacist Encounter  Dispensed samples to patient:  Medication: Kisqali 200mg  tablets Instructions: Take 3 tablets (600mg ) by mouth once daily without regard to food. Take for 3 weeks on, 1 week off, repeat every 4 weeks. Quantity dispensed: 63 Days supply: 28 Manufacturer: [employer] Lot: [id number] Exp: 02/19/2017  Johny Drilling, PharmD, BCPS, BCOP 02/01/2017 11:56 AM Oral Oncology Clinic [phone]

## 2017-02-02 ENCOUNTER — Telehealth: Payer: Self-pay | Admitting: Pharmacist

## 2017-02-02 NOTE — Telephone Encounter (Signed)
Oral Oncology Pharmacist Encounter  I met patient in [facility] lobby on 02/01/17 to discuss insurance coverage of Kisqali. Patient now has Medicare Part D prescription medication coverage plan, and no longer has the Medicaid coverage she has previously.  Patient states insurance will cover their portion of Kisqali, however, her copayment is ~$750/month and this is prohibitively expensive.  Samples for 28-day supply provided to patient on 02/01/17, she will start this next cycle of Kisqali 221m tablets, 3 tabs (6073m by mouth once daily on Monday 02/04/17.  Patient went ahead and signed manufacturer patient assistance application while she was here.  Oral Oncology Clinic will follow-up with patient for Kisqali coverage options.  JeJohny DrillingPharmD, BCPS, BCOP 02/02/2017 10:56 AM Oral Oncology Clinic [phone]

## 2017-02-05 NOTE — Telephone Encounter (Signed)
Oral Oncology Patient Advocate Encounter  Followed up with the patient's new Medicare Part D plan. She is actively covered at this time.  A test claim for Kisqali returned a $0 copay.    I have contacted Debbie Martinez and informed her that all of the insurance issues appear to be resolved.  I will make arrangements for the pharmacy to reach out to her when she is due for her next refill.    She will reach out to the office if any issues arise.    Fabio Asa. Melynda Keller, [place] Patient [place] [phone] 02/05/2017 10:47 AM

## 2017-02-11 ENCOUNTER — Ambulatory Visit (HOSPITAL_BASED_OUTPATIENT_CLINIC_OR_DEPARTMENT_OTHER): Payer: Medicare Other

## 2017-02-11 ENCOUNTER — Ambulatory Visit (HOSPITAL_BASED_OUTPATIENT_CLINIC_OR_DEPARTMENT_OTHER): Payer: Medicare Other | Admitting: Hematology and Oncology

## 2017-02-11 VITALS — BP 142/92 | HR 99 | Temp 98.5°F | Resp 18 | Ht 67.0 in | Wt 181.8 lb

## 2017-02-11 DIAGNOSIS — C7951 Secondary malignant neoplasm of bone: Secondary | ICD-10-CM

## 2017-02-11 DIAGNOSIS — C50919 Malignant neoplasm of unspecified site of unspecified female breast: Secondary | ICD-10-CM

## 2017-02-11 DIAGNOSIS — D701 Agranulocytosis secondary to cancer chemotherapy: Secondary | ICD-10-CM | POA: Diagnosis not present

## 2017-02-11 DIAGNOSIS — Z170 Estrogen receptor positive status [ER+]: Secondary | ICD-10-CM

## 2017-02-11 DIAGNOSIS — C50211 Malignant neoplasm of upper-inner quadrant of right female breast: Secondary | ICD-10-CM | POA: Diagnosis not present

## 2017-02-11 DIAGNOSIS — C773 Secondary and unspecified malignant neoplasm of axilla and upper limb lymph nodes: Secondary | ICD-10-CM | POA: Diagnosis not present

## 2017-02-11 LAB — COMPREHENSIVE METABOLIC PANEL
ALBUMIN: 4 g/dL (ref 3.5–5.0)
ALK PHOS: 54 U/L (ref 40–150)
ALT: 17 U/L (ref 0–55)
ANION GAP: 12 meq/L — AB (ref 3–11)
AST: 14 U/L (ref 5–34)
BUN: 10.6 mg/dL (ref 7.0–26.0)
CO2: 26 mEq/L (ref 22–29)
Calcium: 9.8 mg/dL (ref 8.4–10.4)
Chloride: 104 mEq/L (ref 98–109)
Creatinine: 0.8 mg/dL (ref 0.6–1.1)
GLUCOSE: 127 mg/dL (ref 70–140)
POTASSIUM: 3.3 meq/L — AB (ref 3.5–5.1)
SODIUM: 142 meq/L (ref 136–145)
Total Bilirubin: 0.51 mg/dL (ref 0.20–1.20)
Total Protein: 7.6 g/dL (ref 6.4–8.3)

## 2017-02-11 LAB — CBC WITH DIFFERENTIAL/PLATELET
BASO%: 0.6 % (ref 0.0–2.0)
BASOS ABS: 0 10*3/uL (ref 0.0–0.1)
EOS ABS: 0 10*3/uL (ref 0.0–0.5)
EOS%: 1.6 % (ref 0.0–7.0)
HCT: 31.2 % — ABNORMAL LOW (ref 34.8–46.6)
HEMOGLOBIN: 10.2 g/dL — AB (ref 11.6–15.9)
LYMPH%: 20.5 % (ref 14.0–49.7)
MCH: 31 pg (ref 25.1–34.0)
MCHC: 32.8 g/dL (ref 31.5–36.0)
MCV: 94.5 fL (ref 79.5–101.0)
MONO#: 0.1 10*3/uL (ref 0.1–0.9)
MONO%: 6.1 % (ref 0.0–14.0)
NEUT#: 1.7 10*3/uL (ref 1.5–6.5)
NEUT%: 71.2 % (ref 38.4–76.8)
PLATELETS: 296 10*3/uL (ref 145–400)
RBC: 3.31 10*6/uL — ABNORMAL LOW (ref 3.70–5.45)
RDW: 15.1 % — AB (ref 11.2–14.5)
WBC: 2.4 10*3/uL — ABNORMAL LOW (ref 3.9–10.3)
lymph#: 0.5 10*3/uL — ABNORMAL LOW (ref 0.9–3.3)

## 2017-02-11 NOTE — Assessment & Plan Note (Signed)
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   Treatment summary:  1. Patient completed neoadjuvant dose dense Adriamycin and Cytoxan and weekly Taxol X 12 started 02/09/2014 and completed 07/01/14 2. Right double lumpectomy 2:00: IDC grade 3, 3.2 cm, high-grade DCIS, LV I present, PNI present,; 12:00: IDC grade 3; 0.8 cm, high-grade DCIS, LVI, 4/4 lymph nodes positive with ECE, ER 90%, PR 40%, HER-2 negative margin positive T2 N2 M0 stage IIIa 3. Patient is on [facility] clinical trial and she was randomized intraoperatively for NO lymph node dissection. 4.Tamoxifen 20 mg daily started 12/29/2014 stopped July 2016 for adverse effects 5. 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 Palliative XRT to the spine for cord compression --------------------------------------------------------------------------------------------------------------------------------------------------------- Current treatment: Ribociclibwith letrozole plus ovarian suppression started 01/13/2016 Bone Mets: Xgeva with calcium and vitamin D Ribociclib toxicities:  1.Fatigue 2. Neutropenia  11/08/2016: CT chest abdomen pelvis and bone scan: Stable disease no soft tissue metastases. Bone metastases unchanged from before.  Plan: Every 3 month lab work and scans and follow-up.

## 2017-02-11 NOTE — Progress Notes (Signed)
Patient Care Team: System, Provider Not In as PCP - General [name], MD as Consulting Physician (General Surgery) [name], MD as Consulting Physician (Hematology and Oncology) [name], MD as Attending Physician (Radiation Oncology) Benson Norway, RN as Registered Nurse (Oncology) Sylvan Cheese, NP as Nurse Practitioner (Hematology and Oncology)  DIAGNOSIS:  Encounter Diagnoses  Name Primary?  . Metastatic breast cancer ([place]) Yes  . Malignant neoplasm of upper-inner quadrant of right breast in female, estrogen receptor positive ([place])     SUMMARY OF ONCOLOGIC HISTORY:   Breast cancer of upper-inner quadrant of right female breast ([place])   01/12/2014 Mammogram    Right breast - two lesions: #1 2:00 position 1.6 x 1.9 cm and #2 12:00 position 0.5 x 0.9 cm. Distance between the 2 was 4.5 cm, right axillary lymph node enlargement      01/12/2014 Initial Biopsy    Right breast 3 biopsies: All were IDC with DCIS ER+ PR + Ki-67 85% HER-2 negative: Right axillary lymph node also positive for metastatic cancer ER positive HER-2 negative Ki-67 80% grade 2      01/18/2014 Breast MRI    Right breast 12:00 position 2.8 x 2 x 2.2 cm: 2:00 position 1.3 x 0.8 x 0.5 cm, right axilla multiple enlarged lymph nodes 1 cm in size, right retropectoral lymph node 0.6 cm      01/18/2014 Clinical Stage    Stage IIB T2 N1      01/25/2014 Procedure    Breast/Ovarian ([facility]) reveals no clinically significant variant at ATM, BARD1, BRCA1, BRCA2, BRIP1, CDH1, CHEK2, EPCAM, FANCC, MLH1, MSH2, MSH6, NBN, PALB2, PMS2, PTEN, RAD51C, RAD51D, TP53, and XRCC2.       02/11/2014 -  Neo-Adjuvant Chemotherapy    Doxorubicin and cyclophosphamide X 4 followed by Taxol weekly x12       07/12/2014 Breast MRI    Partial response to neoadjuvant chemotherapy right breast mass decreased from 2.8 cm to 2.3 cm, 2 small masses along the medial margin also decreased; significant decrease right  breast mass 11 to 12:00 and 13 mm to 8 mm, decrease in right axilla LN      08/23/2014 Definitive Surgery    Right lumpectomy/[facility]NB [name]) 2:00: IDC grade 3, 3.2 cm, high-grade DCIS, LV I present, PNI present,; 12:00: IDC grade 30.8 cm, high-grade DCIS, LV I, 4/4 lymph nodes positive with ECE, ER 90%, PR 40%, HER-2 negative margin positive      08/23/2014 Pathologic Stage    Stage IIIA: ypT2 ypN2a      09/15/2014 Surgery    Reexcision of margins: clear; no evidence of malignancy      11/03/2014 - 12/14/2014 Radiation Therapy    Adjuvant XRT Isidore Moos): Right Breast, supraclavicular nodes, axillary nodes, and internal mammary nodes: 50 Gy over 25 fractions; right breast boost: 10 Gy over 5 fractions. Total dose: 60 Gy      12/29/2014 -  Anti-estrogen oral therapy    Tamoxifen 20 mg daily      04/08/2015 Survivorship    Survivorship care plan completed and mailed to patient in lieu of in person visit at her request      12/31/2015 Relapse/Recurrence    Innumerable osseous metastases with acute pathologic fracture the L4 vertebral body. 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 L5-S1 protrusion with left more than right S1 impingement      01/05/2016 - 01/12/2016 Radiation Therapy    Palliative radiation to the spine for cord compression  01/13/2016 -  Anti-estrogen oral therapy    Ribociclib 3 weeks on and 1 week off and Letrozole (with Zolodex until oophorectomy) and Xgeva for bone mets       CHIEF COMPLIANT: three-month follow-up on Ribociclib and letrozole along with Delton See  INTERVAL HISTORY: ABBIGAL RADICH is a 41 year old with above-mentioned is metastatic breast cancer who is currently on Ribociclib along with letrozole and Xgeva. She is also an ovarian suppression. She had been tolerating the treatment extremely well.She denies any major difficulties with this treatment. She does have mild fatigue.  REVIEW OF SYSTEMS:   Constitutional: Denies  fevers, chills or abnormal weight loss, complains of mild fatigue Eyes: Denies blurriness of vision Ears, nose, mouth, throat, and face: Denies mucositis or sore throat Respiratory: Denies cough, dyspnea or wheezes Cardiovascular: Denies palpitation, chest discomfort Gastrointestinal:  Denies nausea, heartburn or change in bowel habits Skin: Denies abnormal skin rashes Lymphatics: Denies new lymphadenopathy or easy bruising Neurological:Denies numbness, tingling or new weaknesses Behavioral/Psych: Mood is stable, no new changes  Extremities: No lower extremity edema Breast:  denies any pain or lumps or nodules in either breasts All other systems were reviewed with the patient and are negative.  I have reviewed the past medical history, past surgical history, social history and family history with the patient and they are unchanged from previous note.  ALLERGIES:  is allergic to lisinopril and tamoxifen.  MEDICATIONS:  Current Outpatient Prescriptions  Medication Sig Dispense Refill  . ACCU-CHEK AVIVA PLUS test strip USE AS DIRECTED 4 TIMES A DAY 1 each 12  . ACCU-CHEK SOFTCLIX LANCETS lancets 100 each by Other route 4 (four) times daily. Use as instructed 100 each 0  . atenolol (TENORMIN) 50 MG tablet Take 50 mg by mouth daily.    . blood glucose meter kit and supplies KIT Dispense based on patient and insurance preference. Use up to four times daily as directed. (FOR ICD-9 250.00, 250.01). 1 each 0  . Cholecalciferol (VITAMIN D-3 PO) Take 1 capsule by mouth daily.    . diazepam (VALIUM) 5 MG tablet Take 1 tablet (5 mg total) by mouth 2 (two) times daily. (Patient not taking: Reported on 03/14/2016) 10 tablet 0  . ENSURE (ENSURE) Take 237 mLs by mouth 2 (two) times daily between meals.     . gabapentin (NEURONTIN) 300 MG capsule Take 1 capsule (300 mg total) by mouth 3 (three) times daily. 90 capsule 5  . Iron-Vitamins (GERITOL) LIQD Take 5 mLs by mouth daily.    Marland Kitchen KISQALI 200 DOSE 200  MG TABS TAKE 3 TABLETS BY MOUTH DAILY. 3 WEEKS ON AND 1 WEEK OFF 63 tablet 6  . letrozole (FEMARA) 2.5 MG tablet Take 1 tablet (2.5 mg total) by mouth daily. 90 tablet 3  . Lidocaine-Prilocaine, Bulk, 2.5-2.5 % CREA Apply 5 mLs topically as needed. 2 hours before use, cover with plawtic wrap. 30 g prn  . Magnesium Salicylate (DOANS PILLS PO) Take 2 tablets by mouth daily as needed (for pain).    . metFORMIN (GLUCOPHAGE) 500 MG tablet Take 1 tablet (500 mg total) by mouth 2 (two) times daily with a meal. (Patient not taking: Reported on 05/30/2016) 60 tablet 0  . methocarbamol (ROBAXIN) 500 MG tablet Take 1 tablet (500 mg total) by mouth 2 (two) times daily as needed for muscle spasms. (Patient taking differently: Take 1,000 mg by mouth 4 (four) times daily as needed for muscle spasms. ) 20 tablet 0  . omeprazole (PRILOSEC) 20 MG  capsule Take 1 capsule (20 mg total) by mouth daily. Take while using dexamethasone to prevent heartburn. 30 capsule 2  . oxyCODONE-acetaminophen (PERCOCET/ROXICET) 5-325 MG tablet Take 1 tablet by mouth every 6 (six) hours as needed for severe pain. 120 tablet 0  . polyethylene glycol (MIRALAX / GLYCOLAX) packet Take 17 g by mouth daily. (Patient not taking: Reported on 05/30/2016) 14 each 0  . Ribociclib Succinate 200 MG TABS Take 600 mg by mouth daily. 3 weeks on 1 week off 63 tablet 6  . traMADol (ULTRAM) 50 MG tablet Take 50-100 mg by mouth every 6 (six) hours as needed (for pain).     No current facility-administered medications for this visit.    Facility-Administered Medications Ordered in Other Visits  Medication Dose Route Frequency Provider Last Rate Last Dose  . goserelin (ZOLADEX) injection 3.6 mg  3.6 mg Subcutaneous Q28 days Serena Croissant, MD   3.6 mg at 03/23/16 1122    PHYSICAL EXAMINATION: ECOG PERFORMANCE STATUS: 1 - Symptomatic but completely ambulatory  Vitals:   02/11/17 1515  BP: (!) 142/92  Pulse: 99  Resp: 18  Temp: 98.5 F (36.9 C)  SpO2:  100%   Filed Weights   02/11/17 1515  Weight: 181 lb 12.8 oz (82.5 kg)    GENERAL:alert, no distress and comfortable SKIN: skin color, texture, turgor are normal, no rashes or significant lesions EYES: normal, Conjunctiva are pink and non-injected, sclera clear OROPHARYNX:no exudate, no erythema and lips, buccal mucosa, and tongue normal  NECK: supple, thyroid normal size, non-tender, without nodularity LYMPH:  no palpable lymphadenopathy in the cervical, axillary or inguinal LUNGS: clear to auscultation and percussion with normal breathing effort HEART: regular rate & rhythm and no murmurs and no lower extremity edema ABDOMEN:abdomen soft, non-tender and normal bowel sounds MUSCULOSKELETAL:no cyanosis of digits and no clubbing  NEURO: alert & oriented x 3 with fluent speech, no focal motor/sensory deficits EXTREMITIES: No lower extremity edema BREAST: No palpable masses or nodules in either right or left breasts. No palpable axillary supraclavicular or infraclavicular adenopathy no breast tenderness or nipple discharge. (exam performed in the presence of a chaperone)  LABORATORY DATA:  I have reviewed the data as listed   Chemistry      Component Value Date/Time   NA 141 01/07/2017 1029   K 3.5 01/07/2017 1029   CL 98 (L) 01/04/2016 0504   CO2 27 01/07/2017 1029   BUN 8.4 01/07/2017 1029   CREATININE 0.8 01/07/2017 1029      Component Value Date/Time   CALCIUM 9.3 01/07/2017 1029   ALKPHOS 46 01/07/2017 1029   AST 16 01/07/2017 1029   ALT 18 01/07/2017 1029   BILITOT 0.33 01/07/2017 1029       Lab Results  Component Value Date   WBC 2.1 (L) 01/07/2017   HGB 10.1 (L) 01/07/2017   HCT 31.9 (L) 01/07/2017   MCV 97.6 01/07/2017   PLT 200 01/07/2017   NEUTROABS 1.2 (L) 01/07/2017    ASSESSMENT & PLAN:  Breast cancer of upper-inner quadrant of right female breast ([facility]) Right breast invasive ductal carcinoma ER/PR positive HER-2 negative multifocal disease T2 N1 stage  IIB clinical stage grade 2 with biopsy-proven axillary lymph node metastases.   Treatment summary:  1. Patient completed neoadjuvant dose dense Adriamycin and Cytoxan and weekly Taxol X 12 started 02/09/2014 and completed 07/01/14 2. Right double lumpectomy 2:00: IDC grade 3, 3.2 cm, high-grade DCIS, LV I present, PNI present,; 12:00: IDC grade 3; 0.8 cm,  high-grade DCIS, LVI, 4/4 lymph nodes positive with ECE, ER 90%, PR 40%, HER-2 negative margin positive T2 N2 M0 stage IIIa 3. Patient is on [facility] clinical trial and she was randomized intraoperatively for NO lymph node dissection. 4.Tamoxifen 20 mg daily started 12/29/2014 stopped July 2016 for adverse effects 5. 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 Palliative XRT to the spine for cord compression --------------------------------------------------------------------------------------------------------------------------------------------------------- Current treatment: Ribociclibwith letrozole plus ovarian suppression started 01/13/2016 Bone Mets: Xgeva with calcium and vitamin D Ribociclib toxicities:  1.Fatigue 2. Neutropenia  11/08/2016: CT chest abdomen pelvis and bone scan: Stable disease no soft tissue metastases. Bone metastases unchanged from before.  Plan: Every 3 month lab work and scans and follow-up.    I spent 25 minutes talking to the patient of which more than half was spent in counseling and coordination of care.  Orders Placed This Encounter  Procedures  . CT Abdomen Pelvis W Contrast    Standing Status:   Future    Standing Expiration Date:   02/11/2018    Order Specific Question:   If indicated for the ordered procedure, I authorize the administration of contrast media per Radiology protocol    Answer:   Yes    Order Specific Question:   Is patient pregnant?    Answer:   No    Order Specific  Question:   Preferred imaging location?    Answer:   [facility]    Order Specific Question:   Radiology Contrast Protocol - do NOT remove file path    Answer:   [email or web address]    Order Specific Question:   Reason for Exam additional comments    Answer:   Restaging metastatic breast cancer  . CT Chest W Contrast    Standing Status:   Future    Standing Expiration Date:   02/11/2018    Order Specific Question:   If indicated for the ordered procedure, I authorize the administration of contrast media per Radiology protocol    Answer:   Yes    Order Specific Question:   Is patient pregnant?    Answer:   No    Order Specific Question:   Preferred imaging location?    Answer:   [facility]    Order Specific Question:   Radiology Contrast Protocol - do NOT remove file path    Answer:   [email or web address]    Order Specific Question:   Reason for Exam additional comments    Answer:   resatging metastatic breast  . NM Bone Scan Whole Body    Standing Status:   Future    Standing Expiration Date:   02/11/2018    Order Specific Question:   If indicated for the ordered procedure, I authorize the administration of a radiopharmaceutical per Radiology protocol    Answer:   Yes    Order Specific Question:   Is the patient pregnant?    Answer:   No    Order Specific Question:   Preferred imaging location?    Answer:   [facility]    Order Specific Question:   Radiology Contrast Protocol - do NOT remove file path    Answer:   [email or web address]    Order Specific Question:   Reason for Exam additional comments    Answer:   restaging metastatic breast cancer    Order Specific Question:   Is patient pregnant?    Answer:   No  The patient has a good understanding of the overall plan. she agrees with it. she will call with any problems that may develop before the next visit here.   Rulon Eisenmenger, MD 02/11/17

## 2017-02-12 ENCOUNTER — Telehealth: Payer: Self-pay | Admitting: Hematology and Oncology

## 2017-02-12 ENCOUNTER — Ambulatory Visit (HOSPITAL_BASED_OUTPATIENT_CLINIC_OR_DEPARTMENT_OTHER): Payer: Medicare Other

## 2017-02-12 ENCOUNTER — Ambulatory Visit: Payer: Medicaid Other | Admitting: Hematology and Oncology

## 2017-02-12 ENCOUNTER — Other Ambulatory Visit: Payer: Medicaid Other

## 2017-02-12 VITALS — BP 142/94 | HR 99 | Temp 98.3°F | Resp 20

## 2017-02-12 DIAGNOSIS — Z5111 Encounter for antineoplastic chemotherapy: Secondary | ICD-10-CM

## 2017-02-12 DIAGNOSIS — Z95828 Presence of other vascular implants and grafts: Secondary | ICD-10-CM

## 2017-02-12 DIAGNOSIS — C50911 Malignant neoplasm of unspecified site of right female breast: Secondary | ICD-10-CM

## 2017-02-12 DIAGNOSIS — C7951 Secondary malignant neoplasm of bone: Secondary | ICD-10-CM | POA: Diagnosis not present

## 2017-02-12 DIAGNOSIS — C50211 Malignant neoplasm of upper-inner quadrant of right female breast: Secondary | ICD-10-CM | POA: Diagnosis not present

## 2017-02-12 DIAGNOSIS — Z452 Encounter for adjustment and management of vascular access device: Secondary | ICD-10-CM

## 2017-02-12 MED ORDER — HEPARIN SOD (PORK) LOCK FLUSH 100 UNIT/ML IV SOLN
500.0000 [IU] | Freq: Once | INTRAVENOUS | Status: AC
  Administered 2017-02-12: 500 [IU]
  Filled 2017-02-12: qty 5

## 2017-02-12 MED ORDER — GOSERELIN ACETATE 3.6 MG ~~LOC~~ IMPL
3.6000 mg | DRUG_IMPLANT | Freq: Once | SUBCUTANEOUS | Status: AC
  Administered 2017-02-12: 3.6 mg via SUBCUTANEOUS
  Filled 2017-02-12: qty 3.6

## 2017-02-12 MED ORDER — SODIUM CHLORIDE 0.9% FLUSH
10.0000 mL | Freq: Once | INTRAVENOUS | Status: AC
  Administered 2017-02-12: 10 mL
  Filled 2017-02-12: qty 10

## 2017-02-12 MED ORDER — DENOSUMAB 120 MG/1.7ML ~~LOC~~ SOLN
120.0000 mg | Freq: Once | SUBCUTANEOUS | Status: AC
  Administered 2017-02-12: 120 mg via SUBCUTANEOUS
  Filled 2017-02-12: qty 1.7

## 2017-02-12 NOTE — Telephone Encounter (Signed)
Gave avs and calendar for November - January 2019 °

## 2017-02-12 NOTE — Patient Instructions (Signed)
Denosumab injection What is this medicine? DENOSUMAB (den oh sue mab) slows bone breakdown. Prolia is used to treat osteoporosis in women after menopause and in men. Delton See is used to treat a high calcium level due to cancer and to prevent bone fractures and other bone problems caused by multiple myeloma or cancer bone metastases. Delton See is also used to treat giant cell tumor of the bone. This medicine may be used for other purposes; ask your health care provider or pharmacist if you have questions. COMMON BRAND NAME(S): Prolia, XGEVA What should I tell my health care provider before I take this medicine? They need to know if you have any of these conditions: -dental disease -having surgery or tooth extraction -infection -kidney disease -low levels of calcium or Vitamin D in the blood -malnutrition -on hemodialysis -skin conditions or sensitivity -thyroid or parathyroid disease -an unusual reaction to denosumab, other medicines, foods, dyes, or preservatives -pregnant or trying to get pregnant -breast-feeding How should I use this medicine? This medicine is for injection under the skin. It is given by a health care professional in a hospital or clinic setting. If you are getting Prolia, a special MedGuide will be given to you by the pharmacist with each prescription and refill. Be sure to read this information carefully each time. For Prolia, talk to your pediatrician regarding the use of this medicine in children. Special care may be needed. For Delton See, talk to your pediatrician regarding the use of this medicine in children. While this drug may be prescribed for children as young as 13 years for selected conditions, precautions do apply. Overdosage: If you think you have taken too much of this medicine contact a poison control center or emergency room at once. NOTE: This medicine is only for you. Do not share this medicine with others. What if I miss a dose? It is important not to miss your  dose. Call your doctor or health care professional if you are unable to keep an appointment. What may interact with this medicine? Do not take this medicine with any of the following medications: -other medicines containing denosumab This medicine may also interact with the following medications: -medicines that lower your chance of fighting infection -steroid medicines like prednisone or cortisone This list may not describe all possible interactions. Give your health care provider a list of all the medicines, herbs, non-prescription drugs, or dietary supplements you use. Also tell them if you smoke, drink alcohol, or use illegal drugs. Some items may interact with your medicine. What should I watch for while using this medicine? Visit your doctor or health care professional for regular checks on your progress. Your doctor or health care professional may order blood tests and other tests to see how you are doing. Call your doctor or health care professional for advice if you get a fever, chills or sore throat, or other symptoms of a cold or flu. Do not treat yourself. This drug may decrease your body's ability to fight infection. Try to avoid being around people who are sick. You should make sure you get enough calcium and vitamin D while you are taking this medicine, unless your doctor tells you not to. Discuss the foods you eat and the vitamins you take with your health care professional. See your dentist regularly. Brush and floss your teeth as directed. Before you have any dental work done, tell your dentist you are receiving this medicine. Do not become pregnant while taking this medicine or for 5 months after stopping  it. Talk with your doctor or health care professional about your birth control options while taking this medicine. Women should inform their doctor if they wish to become pregnant or think they might be pregnant. There is a potential for serious side effects to an unborn child. Talk  to your health care professional or pharmacist for more information. What side effects may I notice from receiving this medicine? Side effects that you should report to your doctor or health care professional as soon as possible: -allergic reactions like skin rash, itching or hives, swelling of the face, lips, or tongue -bone pain -breathing problems -dizziness -jaw pain, especially after dental work -redness, blistering, peeling of the skin -signs and symptoms of infection like fever or chills; cough; sore throat; pain or trouble passing urine -signs of low calcium like fast heartbeat, muscle cramps or muscle pain; pain, tingling, numbness in the hands or feet; seizures -unusual bleeding or bruising -unusually weak or tired Side effects that usually do not require medical attention (report to your doctor or health care professional if they continue or are bothersome): -constipation -diarrhea -headache -joint pain -loss of appetite -muscle pain -runny nose -tiredness -upset stomach This list may not describe all possible side effects. Call your doctor for medical advice about side effects. You may report side effects to FDA at 1-800-FDA-1088. Where should I keep my medicine? This medicine is only given in a clinic, doctor's office, or other health care setting and will not be stored at home. NOTE: This sheet is a summary. It may not cover all possible information. If you have questions about this medicine, talk to your doctor, pharmacist, or health care provider.  2018 [employer] Standard (2016-05-01 19:17:21) Goserelin injection What is this medicine? GOSERELIN (GOE se rel in) is similar to a hormone found in the body. It lowers the amount of sex hormones that the body makes. Men will have lower testosterone levels and women will have lower estrogen levels while taking this medicine. In men, this medicine is used to treat prostate cancer; the injection is either given once per  month or once every 12 weeks. A once per month injection (only) is used to treat women with endometriosis, dysfunctional uterine bleeding, or advanced breast cancer. This medicine may be used for other purposes; ask your health care provider or pharmacist if you have questions. COMMON BRAND NAME(S): Zoladex What should I tell my health care provider before I take this medicine? They need to know if you have any of these conditions (some only apply to women): -diabetes -heart disease or previous heart attack -high blood pressure -high cholesterol -kidney disease -osteoporosis or low bone density -problems passing urine -spinal cord injury -stroke -tobacco smoker -an unusual or allergic reaction to goserelin, hormone therapy, other medicines, foods, dyes, or preservatives -pregnant or trying to get pregnant -breast-feeding How should I use this medicine? This medicine is for injection under the skin. It is given by a health care professional in a hospital or clinic setting. Men receive this injection once every 4 weeks or once every 12 weeks. Women will only receive the once every 4 weeks injection. Talk to your pediatrician regarding the use of this medicine in children. Special care may be needed. Overdosage: If you think you have taken too much of this medicine contact a poison control center or emergency room at once. NOTE: This medicine is only for you. Do not share this medicine with others. What if I miss a dose? It is important not   to miss your dose. Call your doctor or health care professional if you are unable to keep an appointment. What may interact with this medicine? -female hormones like estrogen -herbal or dietary supplements like black cohosh, chasteberry, or DHEA -female hormones like testosterone -prasterone This list may not describe all possible interactions. Give your health care provider a list of all the medicines, herbs, non-prescription drugs, or dietary  supplements you use. Also tell them if you smoke, drink alcohol, or use illegal drugs. Some items may interact with your medicine. What should I watch for while using this medicine? Visit your doctor or health care professional for regular checks on your progress. Your symptoms may appear to get worse during the first weeks of this therapy. Tell your doctor or healthcare professional if your symptoms do not start to get better or if they get worse after this time. Your bones may get weaker if you take this medicine for a long time. If you smoke or frequently drink alcohol you may increase your risk of bone loss. A family history of osteoporosis, chronic use of drugs for seizures (convulsions), or corticosteroids can also increase your risk of bone loss. Talk to your doctor about how to keep your bones strong. This medicine should stop regular monthly menstration in women. Tell your doctor if you continue to [facility]. Women should not become pregnant while taking this medicine or for 12 weeks after stopping this medicine. Women should inform their doctor if they wish to become pregnant or think they might be pregnant. There is a potential for serious side effects to an unborn child. Talk to your health care professional or pharmacist for more information. Do not breast-feed an infant while taking this medicine. Men should inform their doctors if they wish to father a child. This medicine may lower sperm counts. Talk to your health care professional or pharmacist for more information. What side effects may I notice from receiving this medicine? Side effects that you should report to your doctor or health care professional as soon as possible: -allergic reactions like skin rash, itching or hives, swelling of the face, lips, or tongue -bone pain -breathing problems -changes in vision -chest pain -feeling faint or lightheaded, falls -fever, chills -pain, swelling, warmth in the leg -pain, tingling,  numbness in the hands or feet -signs and symptoms of low blood pressure like dizziness; feeling faint or lightheaded, falls; unusually weak or tired -stomach pain -swelling of the ankles, feet, hands -trouble passing urine or change in the amount of urine -unusually high or low blood pressure -unusually weak or tired Side effects that usually do not require medical attention (report to your doctor or health care professional if they continue or are bothersome): -change in sex drive or performance -changes in breast size in both males and females -changes in emotions or moods -headache -hot flashes -irritation at site where injected -loss of appetite -skin problems like acne, dry skin -vaginal dryness This list may not describe all possible side effects. Call your doctor for medical advice about side effects. You may report side effects to FDA at 1-800-FDA-1088. Where should I keep my medicine? This drug is given in a hospital or clinic and will not be stored at home. NOTE: This sheet is a summary. It may not cover all possible information. If you have questions about this medicine, talk to your doctor, pharmacist, or health care provider.  2018 [employer] Standard (2013-06-16 11:10:35)

## 2017-02-13 ENCOUNTER — Other Ambulatory Visit (HOSPITAL_COMMUNITY)
Admission: RE | Admit: 2017-02-13 | Discharge: 2017-02-13 | Disposition: A | Discharge status: Home / Self Care | Payer: Medicare Other | Source: Ambulatory Visit | Attending: Family Medicine | Admitting: Family Medicine

## 2017-02-13 ENCOUNTER — Other Ambulatory Visit: Payer: Self-pay | Admitting: Family Medicine

## 2017-02-13 DIAGNOSIS — E1165 Type 2 diabetes mellitus with hyperglycemia: Secondary | ICD-10-CM | POA: Diagnosis not present

## 2017-02-13 DIAGNOSIS — Z1211 Encounter for screening for malignant neoplasm of colon: Secondary | ICD-10-CM | POA: Diagnosis not present

## 2017-02-13 DIAGNOSIS — I10 Essential (primary) hypertension: Secondary | ICD-10-CM | POA: Diagnosis not present

## 2017-02-13 DIAGNOSIS — Z01419 Encounter for gynecological examination (general) (routine) without abnormal findings: Secondary | ICD-10-CM | POA: Diagnosis not present

## 2017-02-13 DIAGNOSIS — Z124 Encounter for screening for malignant neoplasm of cervix: Secondary | ICD-10-CM | POA: Insufficient documentation

## 2017-02-15 LAB — CYTOLOGY - PAP
Diagnosis: NEGATIVE
HPV: NOT DETECTED

## 2017-02-21 MED FILL — KISQALI 200 MG DAILY DOSE: 200 | 28 days supply | Qty: 63 | Fill #5

## 2017-02-27 DIAGNOSIS — I10 Essential (primary) hypertension: Secondary | ICD-10-CM | POA: Diagnosis not present

## 2017-02-27 DIAGNOSIS — C50011 Malignant neoplasm of nipple and areola, right female breast: Secondary | ICD-10-CM | POA: Diagnosis not present

## 2017-02-27 DIAGNOSIS — J399 Disease of upper respiratory tract, unspecified: Secondary | ICD-10-CM | POA: Diagnosis not present

## 2017-02-27 DIAGNOSIS — C799 Secondary malignant neoplasm of unspecified site: Secondary | ICD-10-CM | POA: Diagnosis not present

## 2017-02-27 DIAGNOSIS — E1165 Type 2 diabetes mellitus with hyperglycemia: Secondary | ICD-10-CM | POA: Diagnosis not present

## 2017-03-12 ENCOUNTER — Ambulatory Visit (HOSPITAL_BASED_OUTPATIENT_CLINIC_OR_DEPARTMENT_OTHER): Payer: Medicare Other

## 2017-03-12 DIAGNOSIS — C50211 Malignant neoplasm of upper-inner quadrant of right female breast: Secondary | ICD-10-CM

## 2017-03-12 DIAGNOSIS — C50911 Malignant neoplasm of unspecified site of right female breast: Secondary | ICD-10-CM

## 2017-03-12 DIAGNOSIS — C7951 Secondary malignant neoplasm of bone: Secondary | ICD-10-CM | POA: Diagnosis not present

## 2017-03-12 DIAGNOSIS — Z452 Encounter for adjustment and management of vascular access device: Secondary | ICD-10-CM | POA: Diagnosis not present

## 2017-03-12 DIAGNOSIS — Z5111 Encounter for antineoplastic chemotherapy: Secondary | ICD-10-CM | POA: Diagnosis not present

## 2017-03-12 DIAGNOSIS — Z95828 Presence of other vascular implants and grafts: Secondary | ICD-10-CM

## 2017-03-12 MED ORDER — DENOSUMAB 120 MG/1.7ML ~~LOC~~ SOLN
120.0000 mg | Freq: Once | SUBCUTANEOUS | Status: AC
  Administered 2017-03-12: 120 mg via SUBCUTANEOUS
  Filled 2017-03-12: qty 1.7

## 2017-03-12 MED ORDER — GOSERELIN ACETATE 3.6 MG ~~LOC~~ IMPL
3.6000 mg | DRUG_IMPLANT | Freq: Once | SUBCUTANEOUS | Status: AC
  Administered 2017-03-12: 3.6 mg via SUBCUTANEOUS
  Filled 2017-03-12: qty 3.6

## 2017-03-12 MED ORDER — HEPARIN SOD (PORK) LOCK FLUSH 100 UNIT/ML IV SOLN
500.0000 [IU] | Freq: Once | INTRAVENOUS | Status: AC
Start: 2017-03-12 — End: 2017-03-12
  Administered 2017-03-12: 500 [IU]
  Filled 2017-03-12: qty 5

## 2017-03-12 MED ORDER — SODIUM CHLORIDE 0.9% FLUSH
10.0000 mL | Freq: Once | INTRAVENOUS | Status: AC
  Administered 2017-03-12: 10 mL
  Filled 2017-03-12: qty 10

## 2017-03-12 NOTE — Patient Instructions (Signed)
Denosumab injection What is this medicine? DENOSUMAB (den oh sue mab) slows bone breakdown. Prolia is used to treat osteoporosis in women after menopause and in men. Delton See is used to treat a high calcium level due to cancer and to prevent bone fractures and other bone problems caused by multiple myeloma or cancer bone metastases. Delton See is also used to treat giant cell tumor of the bone. This medicine may be used for other purposes; ask your health care provider or pharmacist if you have questions. COMMON BRAND NAME(S): Prolia, XGEVA What should I tell my health care provider before I take this medicine? They need to know if you have any of these conditions: -dental disease -having surgery or tooth extraction -infection -kidney disease -low levels of calcium or Vitamin D in the blood -malnutrition -on hemodialysis -skin conditions or sensitivity -thyroid or parathyroid disease -an unusual reaction to denosumab, other medicines, foods, dyes, or preservatives -pregnant or trying to get pregnant -breast-feeding How should I use this medicine? This medicine is for injection under the skin. It is given by a health care professional in a hospital or clinic setting. If you are getting Prolia, a special MedGuide will be given to you by the pharmacist with each prescription and refill. Be sure to read this information carefully each time. For Prolia, talk to your pediatrician regarding the use of this medicine in children. Special care may be needed. For Delton See, talk to your pediatrician regarding the use of this medicine in children. While this drug may be prescribed for children as young as 13 years for selected conditions, precautions do apply. Overdosage: If you think you have taken too much of this medicine contact a poison control center or emergency room at once. NOTE: This medicine is only for you. Do not share this medicine with others. What if I miss a dose? It is important not to miss your  dose. Call your doctor or health care professional if you are unable to keep an appointment. What may interact with this medicine? Do not take this medicine with any of the following medications: -other medicines containing denosumab This medicine may also interact with the following medications: -medicines that lower your chance of fighting infection -steroid medicines like prednisone or cortisone This list may not describe all possible interactions. Give your health care provider a list of all the medicines, herbs, non-prescription drugs, or dietary supplements you use. Also tell them if you smoke, drink alcohol, or use illegal drugs. Some items may interact with your medicine. What should I watch for while using this medicine? Visit your doctor or health care professional for regular checks on your progress. Your doctor or health care professional may order blood tests and other tests to see how you are doing. Call your doctor or health care professional for advice if you get a fever, chills or sore throat, or other symptoms of a cold or flu. Do not treat yourself. This drug may decrease your body's ability to fight infection. Try to avoid being around people who are sick. You should make sure you get enough calcium and vitamin D while you are taking this medicine, unless your doctor tells you not to. Discuss the foods you eat and the vitamins you take with your health care professional. See your dentist regularly. Brush and floss your teeth as directed. Before you have any dental work done, tell your dentist you are receiving this medicine. Do not become pregnant while taking this medicine or for 5 months after stopping  it. Talk with your doctor or health care professional about your birth control options while taking this medicine. Women should inform their doctor if they wish to become pregnant or think they might be pregnant. There is a potential for serious side effects to an unborn child. Talk  to your health care professional or pharmacist for more information. What side effects may I notice from receiving this medicine? Side effects that you should report to your doctor or health care professional as soon as possible: -allergic reactions like skin rash, itching or hives, swelling of the face, lips, or tongue -bone pain -breathing problems -dizziness -jaw pain, especially after dental work -redness, blistering, peeling of the skin -signs and symptoms of infection like fever or chills; cough; sore throat; pain or trouble passing urine -signs of low calcium like fast heartbeat, muscle cramps or muscle pain; pain, tingling, numbness in the hands or feet; seizures -unusual bleeding or bruising -unusually weak or tired Side effects that usually do not require medical attention (report to your doctor or health care professional if they continue or are bothersome): -constipation -diarrhea -headache -joint pain -loss of appetite -muscle pain -runny nose -tiredness -upset stomach This list may not describe all possible side effects. Call your doctor for medical advice about side effects. You may report side effects to FDA at 1-800-FDA-1088. Where should I keep my medicine? This medicine is only given in a clinic, doctor's office, or other health care setting and will not be stored at home. NOTE: This sheet is a summary. It may not cover all possible information. If you have questions about this medicine, talk to your doctor, pharmacist, or health care provider.  2018 [employer] Standard (2016-05-01 19:17:21) Goserelin injection What is this medicine? GOSERELIN (GOE se rel in) is similar to a hormone found in the body. It lowers the amount of sex hormones that the body makes. Men will have lower testosterone levels and women will have lower estrogen levels while taking this medicine. In men, this medicine is used to treat prostate cancer; the injection is either given once per  month or once every 12 weeks. A once per month injection (only) is used to treat women with endometriosis, dysfunctional uterine bleeding, or advanced breast cancer. This medicine may be used for other purposes; ask your health care provider or pharmacist if you have questions. COMMON BRAND NAME(S): Zoladex What should I tell my health care provider before I take this medicine? They need to know if you have any of these conditions (some only apply to women): -diabetes -heart disease or previous heart attack -high blood pressure -high cholesterol -kidney disease -osteoporosis or low bone density -problems passing urine -spinal cord injury -stroke -tobacco smoker -an unusual or allergic reaction to goserelin, hormone therapy, other medicines, foods, dyes, or preservatives -pregnant or trying to get pregnant -breast-feeding How should I use this medicine? This medicine is for injection under the skin. It is given by a health care professional in a hospital or clinic setting. Men receive this injection once every 4 weeks or once every 12 weeks. Women will only receive the once every 4 weeks injection. Talk to your pediatrician regarding the use of this medicine in children. Special care may be needed. Overdosage: If you think you have taken too much of this medicine contact a poison control center or emergency room at once. NOTE: This medicine is only for you. Do not share this medicine with others. What if I miss a dose? It is important not   to miss your dose. Call your doctor or health care professional if you are unable to keep an appointment. What may interact with this medicine? -female hormones like estrogen -herbal or dietary supplements like black cohosh, chasteberry, or DHEA -female hormones like testosterone -prasterone This list may not describe all possible interactions. Give your health care provider a list of all the medicines, herbs, non-prescription drugs, or dietary  supplements you use. Also tell them if you smoke, drink alcohol, or use illegal drugs. Some items may interact with your medicine. What should I watch for while using this medicine? Visit your doctor or health care professional for regular checks on your progress. Your symptoms may appear to get worse during the first weeks of this therapy. Tell your doctor or healthcare professional if your symptoms do not start to get better or if they get worse after this time. Your bones may get weaker if you take this medicine for a long time. If you smoke or frequently drink alcohol you may increase your risk of bone loss. A family history of osteoporosis, chronic use of drugs for seizures (convulsions), or corticosteroids can also increase your risk of bone loss. Talk to your doctor about how to keep your bones strong. This medicine should stop regular monthly menstration in women. Tell your doctor if you continue to menstrate. Women should not become pregnant while taking this medicine or for 12 weeks after stopping this medicine. Women should inform their doctor if they wish to become pregnant or think they might be pregnant. There is a potential for serious side effects to an unborn child. Talk to your health care professional or pharmacist for more information. Do not breast-feed an infant while taking this medicine. Men should inform their doctors if they wish to father a child. This medicine may lower sperm counts. Talk to your health care professional or pharmacist for more information. What side effects may I notice from receiving this medicine? Side effects that you should report to your doctor or health care professional as soon as possible: -allergic reactions like skin rash, itching or hives, swelling of the face, lips, or tongue -bone pain -breathing problems -changes in vision -chest pain -feeling faint or lightheaded, falls -fever, chills -pain, swelling, warmth in the leg -pain, tingling,  numbness in the hands or feet -signs and symptoms of low blood pressure like dizziness; feeling faint or lightheaded, falls; unusually weak or tired -stomach pain -swelling of the ankles, feet, hands -trouble passing urine or change in the amount of urine -unusually high or low blood pressure -unusually weak or tired Side effects that usually do not require medical attention (report to your doctor or health care professional if they continue or are bothersome): -change in sex drive or performance -changes in breast size in both males and females -changes in emotions or moods -headache -hot flashes -irritation at site where injected -loss of appetite -skin problems like acne, dry skin -vaginal dryness This list may not describe all possible side effects. Call your doctor for medical advice about side effects. You may report side effects to FDA at 1-800-FDA-1088. Where should I keep my medicine? This drug is given in a hospital or clinic and will not be stored at home. NOTE: This sheet is a summary. It may not cover all possible information. If you have questions about this medicine, talk to your doctor, pharmacist, or health care provider.  2018 [employer] Standard (2013-06-16 11:10:35)   

## 2017-03-18 ENCOUNTER — Other Ambulatory Visit: Payer: Self-pay | Admitting: Hematology and Oncology

## 2017-03-20 MED FILL — LETROZOLE 2.5 MG TABLET: 2.5 | 90 days supply | Qty: 90 | Fill #0

## 2017-03-22 MED FILL — KISQALI 200 MG DAILY DOSE: 200 | 28 days supply | Qty: 63 | Fill #6

## 2017-04-09 ENCOUNTER — Ambulatory Visit (HOSPITAL_BASED_OUTPATIENT_CLINIC_OR_DEPARTMENT_OTHER): Payer: Medicare Other

## 2017-04-09 ENCOUNTER — Other Ambulatory Visit (HOSPITAL_BASED_OUTPATIENT_CLINIC_OR_DEPARTMENT_OTHER): Payer: Medicare Other

## 2017-04-09 DIAGNOSIS — C50911 Malignant neoplasm of unspecified site of right female breast: Secondary | ICD-10-CM

## 2017-04-09 DIAGNOSIS — C50211 Malignant neoplasm of upper-inner quadrant of right female breast: Secondary | ICD-10-CM

## 2017-04-09 DIAGNOSIS — M8458XS Pathological fracture in neoplastic disease, other specified site, sequela: Secondary | ICD-10-CM | POA: Diagnosis not present

## 2017-04-09 DIAGNOSIS — Z452 Encounter for adjustment and management of vascular access device: Secondary | ICD-10-CM

## 2017-04-09 DIAGNOSIS — Z95828 Presence of other vascular implants and grafts: Secondary | ICD-10-CM

## 2017-04-09 DIAGNOSIS — Z5111 Encounter for antineoplastic chemotherapy: Secondary | ICD-10-CM | POA: Diagnosis not present

## 2017-04-09 DIAGNOSIS — C7951 Secondary malignant neoplasm of bone: Secondary | ICD-10-CM | POA: Diagnosis not present

## 2017-04-09 DIAGNOSIS — Z170 Estrogen receptor positive status [ER+]: Principal | ICD-10-CM

## 2017-04-09 LAB — CBC WITH DIFFERENTIAL/PLATELET
BASO%: 0.8 % (ref 0.0–2.0)
BASOS ABS: 0 10*3/uL (ref 0.0–0.1)
EOS%: 0.8 % (ref 0.0–7.0)
Eosinophils Absolute: 0 10*3/uL (ref 0.0–0.5)
HEMATOCRIT: 29.1 % — AB (ref 34.8–46.6)
HGB: 9.4 g/dL — ABNORMAL LOW (ref 11.6–15.9)
LYMPH#: 0.4 10*3/uL — AB (ref 0.9–3.3)
LYMPH%: 19.3 % (ref 14.0–49.7)
MCH: 30.5 pg (ref 25.1–34.0)
MCHC: 32.4 g/dL (ref 31.5–36.0)
MCV: 94 fL (ref 79.5–101.0)
MONO#: 0.1 10*3/uL (ref 0.1–0.9)
MONO%: 3.9 % (ref 0.0–14.0)
NEUT#: 1.8 10*3/uL (ref 1.5–6.5)
NEUT%: 75.2 % (ref 38.4–76.8)
PLATELETS: 291 10*3/uL (ref 145–400)
RBC: 3.1 10*6/uL — ABNORMAL LOW (ref 3.70–5.45)
RDW: 15.4 % — ABNORMAL HIGH (ref 11.2–14.5)
WBC: 2.3 10*3/uL — ABNORMAL LOW (ref 3.9–10.3)

## 2017-04-09 LAB — COMPREHENSIVE METABOLIC PANEL
ALBUMIN: 3.6 g/dL (ref 3.5–5.0)
ALK PHOS: 63 U/L (ref 40–150)
ALT: 12 U/L (ref 0–55)
AST: 11 U/L (ref 5–34)
Anion Gap: 8 mEq/L (ref 3–11)
BILIRUBIN TOTAL: 0.37 mg/dL (ref 0.20–1.20)
BUN: 14.9 mg/dL (ref 7.0–26.0)
CO2: 25 mEq/L (ref 22–29)
Calcium: 9 mg/dL (ref 8.4–10.4)
Chloride: 105 mEq/L (ref 98–109)
Creatinine: 0.9 mg/dL (ref 0.6–1.1)
GLUCOSE: 213 mg/dL — AB (ref 70–140)
Potassium: 3.3 mEq/L — ABNORMAL LOW (ref 3.5–5.1)
SODIUM: 139 meq/L (ref 136–145)
TOTAL PROTEIN: 7.1 g/dL (ref 6.4–8.3)

## 2017-04-09 MED ORDER — GOSERELIN ACETATE 3.6 MG ~~LOC~~ IMPL
3.6000 mg | DRUG_IMPLANT | Freq: Once | SUBCUTANEOUS | Status: AC
  Administered 2017-04-09: 3.6 mg via SUBCUTANEOUS
  Filled 2017-04-09: qty 3.6

## 2017-04-09 MED ORDER — DENOSUMAB 120 MG/1.7ML ~~LOC~~ SOLN
120.0000 mg | Freq: Once | SUBCUTANEOUS | Status: AC
  Administered 2017-04-09: 120 mg via SUBCUTANEOUS

## 2017-04-09 MED ORDER — DENOSUMAB 120 MG/1.7ML ~~LOC~~ SOLN
SUBCUTANEOUS | Status: AC
  Filled 2017-04-09: qty 1.7

## 2017-04-09 MED ORDER — HEPARIN SOD (PORK) LOCK FLUSH 100 UNIT/ML IV SOLN
500.0000 [IU] | Freq: Once | INTRAVENOUS | Status: AC
  Administered 2017-04-09: 500 [IU]
  Filled 2017-04-09: qty 5

## 2017-04-09 MED ORDER — SODIUM CHLORIDE 0.9% FLUSH
10.0000 mL | Freq: Once | INTRAVENOUS | Status: AC
  Administered 2017-04-09: 10 mL
  Filled 2017-04-09: qty 10

## 2017-04-09 MED FILL — OXYCOD/ACETAMINOPHEN 5-325M: 5-325 | 30 days supply | Qty: 90 | Fill #0

## 2017-04-09 NOTE — Patient Instructions (Signed)
Denosumab injection What is this medicine? DENOSUMAB (den oh sue mab) slows bone breakdown. Prolia is used to treat osteoporosis in women after menopause and in men. Delton See is used to treat a high calcium level due to cancer and to prevent bone fractures and other bone problems caused by multiple myeloma or cancer bone metastases. Delton See is also used to treat giant cell tumor of the bone. This medicine may be used for other purposes; ask your health care provider or pharmacist if you have questions. COMMON BRAND NAME(S): Prolia, XGEVA What should I tell my health care provider before I take this medicine? They need to know if you have any of these conditions: -dental disease -having surgery or tooth extraction -infection -kidney disease -low levels of calcium or Vitamin D in the blood -malnutrition -on hemodialysis -skin conditions or sensitivity -thyroid or parathyroid disease -an unusual reaction to denosumab, other medicines, foods, dyes, or preservatives -pregnant or trying to get pregnant -breast-feeding How should I use this medicine? This medicine is for injection under the skin. It is given by a health care professional in a hospital or clinic setting. If you are getting Prolia, a special MedGuide will be given to you by the pharmacist with each prescription and refill. Be sure to read this information carefully each time. For Prolia, talk to your pediatrician regarding the use of this medicine in children. Special care may be needed. For Delton See, talk to your pediatrician regarding the use of this medicine in children. While this drug may be prescribed for children as young as 13 years for selected conditions, precautions do apply. Overdosage: If you think you have taken too much of this medicine contact a poison control center or emergency room at once. NOTE: This medicine is only for you. Do not share this medicine with others. What if I miss a dose? It is important not to miss your  dose. Call your doctor or health care professional if you are unable to keep an appointment. What may interact with this medicine? Do not take this medicine with any of the following medications: -other medicines containing denosumab This medicine may also interact with the following medications: -medicines that lower your chance of fighting infection -steroid medicines like prednisone or cortisone This list may not describe all possible interactions. Give your health care provider a list of all the medicines, herbs, non-prescription drugs, or dietary supplements you use. Also tell them if you smoke, drink alcohol, or use illegal drugs. Some items may interact with your medicine. What should I watch for while using this medicine? Visit your doctor or health care professional for regular checks on your progress. Your doctor or health care professional may order blood tests and other tests to see how you are doing. Call your doctor or health care professional for advice if you get a fever, chills or sore throat, or other symptoms of a cold or flu. Do not treat yourself. This drug may decrease your body's ability to fight infection. Try to avoid being around people who are sick. You should make sure you get enough calcium and vitamin D while you are taking this medicine, unless your doctor tells you not to. Discuss the foods you eat and the vitamins you take with your health care professional. See your dentist regularly. Brush and floss your teeth as directed. Before you have any dental work done, tell your dentist you are receiving this medicine. Do not become pregnant while taking this medicine or for 5 months after stopping  it. Talk with your doctor or health care professional about your birth control options while taking this medicine. Women should inform their doctor if they wish to become pregnant or think they might be pregnant. There is a potential for serious side effects to an unborn child. Talk  to your health care professional or pharmacist for more information. What side effects may I notice from receiving this medicine? Side effects that you should report to your doctor or health care professional as soon as possible: -allergic reactions like skin rash, itching or hives, swelling of the face, lips, or tongue -bone pain -breathing problems -dizziness -jaw pain, especially after dental work -redness, blistering, peeling of the skin -signs and symptoms of infection like fever or chills; cough; sore throat; pain or trouble passing urine -signs of low calcium like fast heartbeat, muscle cramps or muscle pain; pain, tingling, numbness in the hands or feet; seizures -unusual bleeding or bruising -unusually weak or tired Side effects that usually do not require medical attention (report to your doctor or health care professional if they continue or are bothersome): -constipation -diarrhea -headache -joint pain -loss of appetite -muscle pain -runny nose -tiredness -upset stomach This list may not describe all possible side effects. Call your doctor for medical advice about side effects. You may report side effects to FDA at 1-800-FDA-1088. Where should I keep my medicine? This medicine is only given in a clinic, doctor's office, or other health care setting and will not be stored at home. NOTE: This sheet is a summary. It may not cover all possible information. If you have questions about this medicine, talk to your doctor, pharmacist, or health care provider.  2018 Elsevier/Gold Standard (2016-05-01 19:17:21) Goserelin injection What is this medicine? GOSERELIN (GOE se rel in) is similar to a hormone found in the body. It lowers the amount of sex hormones that the body makes. Men will have lower testosterone levels and women will have lower estrogen levels while taking this medicine. In men, this medicine is used to treat prostate cancer; the injection is either given once per  month or once every 12 weeks. A once per month injection (only) is used to treat women with endometriosis, dysfunctional uterine bleeding, or advanced breast cancer. This medicine may be used for other purposes; ask your health care provider or pharmacist if you have questions. COMMON BRAND NAME(S): Zoladex What should I tell my health care provider before I take this medicine? They need to know if you have any of these conditions (some only apply to women): -diabetes -heart disease or previous heart attack -high blood pressure -high cholesterol -kidney disease -osteoporosis or low bone density -problems passing urine -spinal cord injury -stroke -tobacco smoker -an unusual or allergic reaction to goserelin, hormone therapy, other medicines, foods, dyes, or preservatives -pregnant or trying to get pregnant -breast-feeding How should I use this medicine? This medicine is for injection under the skin. It is given by a health care professional in a hospital or clinic setting. Men receive this injection once every 4 weeks or once every 12 weeks. Women will only receive the once every 4 weeks injection. Talk to your pediatrician regarding the use of this medicine in children. Special care may be needed. Overdosage: If you think you have taken too much of this medicine contact a poison control center or emergency room at once. NOTE: This medicine is only for you. Do not share this medicine with others. What if I miss a dose? It is important not   to miss your dose. Call your doctor or health care professional if you are unable to keep an appointment. What may interact with this medicine? -female hormones like estrogen -herbal or dietary supplements like black cohosh, chasteberry, or DHEA -female hormones like testosterone -prasterone This list may not describe all possible interactions. Give your health care provider a list of all the medicines, herbs, non-prescription drugs, or dietary  supplements you use. Also tell them if you smoke, drink alcohol, or use illegal drugs. Some items may interact with your medicine. What should I watch for while using this medicine? Visit your doctor or health care professional for regular checks on your progress. Your symptoms may appear to get worse during the first weeks of this therapy. Tell your doctor or healthcare professional if your symptoms do not start to get better or if they get worse after this time. Your bones may get weaker if you take this medicine for a long time. If you smoke or frequently drink alcohol you may increase your risk of bone loss. A family history of osteoporosis, chronic use of drugs for seizures (convulsions), or corticosteroids can also increase your risk of bone loss. Talk to your doctor about how to keep your bones strong. This medicine should stop regular monthly menstration in women. Tell your doctor if you continue to [facility]. Women should not become pregnant while taking this medicine or for 12 weeks after stopping this medicine. Women should inform their doctor if they wish to become pregnant or think they might be pregnant. There is a potential for serious side effects to an unborn child. Talk to your health care professional or pharmacist for more information. Do not breast-feed an infant while taking this medicine. Men should inform their doctors if they wish to father a child. This medicine may lower sperm counts. Talk to your health care professional or pharmacist for more information. What side effects may I notice from receiving this medicine? Side effects that you should report to your doctor or health care professional as soon as possible: -allergic reactions like skin rash, itching or hives, swelling of the face, lips, or tongue -bone pain -breathing problems -changes in vision -chest pain -feeling faint or lightheaded, falls -fever, chills -pain, swelling, warmth in the leg -pain, tingling,  numbness in the hands or feet -signs and symptoms of low blood pressure like dizziness; feeling faint or lightheaded, falls; unusually weak or tired -stomach pain -swelling of the ankles, feet, hands -trouble passing urine or change in the amount of urine -unusually high or low blood pressure -unusually weak or tired Side effects that usually do not require medical attention (report to your doctor or health care professional if they continue or are bothersome): -change in sex drive or performance -changes in breast size in both males and females -changes in emotions or moods -headache -hot flashes -irritation at site where injected -loss of appetite -skin problems like acne, dry skin -vaginal dryness This list may not describe all possible side effects. Call your doctor for medical advice about side effects. You may report side effects to FDA at 1-800-FDA-1088. Where should I keep my medicine? This drug is given in a hospital or clinic and will not be stored at home. NOTE: This sheet is a summary. It may not cover all possible information. If you have questions about this medicine, talk to your doctor, pharmacist, or health care provider.  2018 Elsevier/Gold Standard (2013-06-16 11:10:35)

## 2017-04-15 ENCOUNTER — Other Ambulatory Visit: Payer: Self-pay | Admitting: Hematology and Oncology

## 2017-04-24 ENCOUNTER — Telehealth: Payer: Self-pay | Admitting: Pharmacy Technician

## 2017-04-24 MED FILL — KISQALI 200 MG DAILY DOSE: 200 | 28 days supply | Qty: 63 | Fill #0

## 2017-04-24 NOTE — Telephone Encounter (Signed)
Oral Oncology Patient Advocate Encounter  Was successful in securing patient a $15,000 grant from [employer] to provide copayment coverage for [place]. This will keep the out of pocket expense at $0.   I have left a message for the patient..   The billing information is as follows and has been shared with [place].   Member ID: [id number] Group ID: [id number] RxBin: [id number] Dates of Eligibility: 03/25/2017 through 03/24/2018  Debbie Martinez. Debbie Martinez, [place] Patient [place] [phone] 04/24/2017 1:17 PM

## 2017-05-03 ENCOUNTER — Telehealth: Payer: Self-pay | Admitting: Hematology and Oncology

## 2017-05-03 NOTE — Telephone Encounter (Signed)
S/w pt to advise of appt chg from 1/25 due to md pal. Gave pt new appt for 1/31 @ 11.15.

## 2017-05-07 ENCOUNTER — Inpatient Hospital Stay: Payer: Medicare Other | Attending: Hematology and Oncology

## 2017-05-07 ENCOUNTER — Inpatient Hospital Stay: Payer: Medicare Other

## 2017-05-07 VITALS — BP 141/95 | HR 94 | Temp 98.7°F | Resp 18

## 2017-05-07 DIAGNOSIS — Z95828 Presence of other vascular implants and grafts: Secondary | ICD-10-CM

## 2017-05-07 DIAGNOSIS — C50211 Malignant neoplasm of upper-inner quadrant of right female breast: Secondary | ICD-10-CM | POA: Diagnosis not present

## 2017-05-07 DIAGNOSIS — R5383 Other fatigue: Secondary | ICD-10-CM | POA: Insufficient documentation

## 2017-05-07 DIAGNOSIS — Z5111 Encounter for antineoplastic chemotherapy: Secondary | ICD-10-CM | POA: Insufficient documentation

## 2017-05-07 DIAGNOSIS — C7951 Secondary malignant neoplasm of bone: Secondary | ICD-10-CM | POA: Diagnosis not present

## 2017-05-07 DIAGNOSIS — Z170 Estrogen receptor positive status [ER+]: Secondary | ICD-10-CM | POA: Insufficient documentation

## 2017-05-07 DIAGNOSIS — Z79811 Long term (current) use of aromatase inhibitors: Secondary | ICD-10-CM | POA: Diagnosis not present

## 2017-05-07 DIAGNOSIS — C50911 Malignant neoplasm of unspecified site of right female breast: Secondary | ICD-10-CM

## 2017-05-07 DIAGNOSIS — C773 Secondary and unspecified malignant neoplasm of axilla and upper limb lymph nodes: Secondary | ICD-10-CM | POA: Insufficient documentation

## 2017-05-07 LAB — CBC WITH DIFFERENTIAL/PLATELET
Basophils Absolute: 0 10*3/uL (ref 0.0–0.1)
Basophils Relative: 1 %
Eosinophils Absolute: 0 10*3/uL (ref 0.0–0.5)
Eosinophils Relative: 1 %
HEMATOCRIT: 29.5 % — AB (ref 34.8–46.6)
HEMOGLOBIN: 9.2 g/dL — AB (ref 11.6–15.9)
LYMPHS ABS: 0.6 10*3/uL — AB (ref 0.9–3.3)
LYMPHS PCT: 23 %
MCH: 30 pg (ref 25.1–34.0)
MCHC: 31.2 g/dL — AB (ref 31.5–36.0)
MCV: 96.1 fL (ref 79.5–101.0)
MONOS PCT: 5 %
Monocytes Absolute: 0.1 10*3/uL (ref 0.1–0.9)
NEUTROS ABS: 1.7 10*3/uL (ref 1.5–6.5)
NEUTROS PCT: 70 %
Platelets: 312 10*3/uL (ref 145–400)
RBC: 3.07 MIL/uL — ABNORMAL LOW (ref 3.70–5.45)
RDW: 15.1 % (ref 11.2–16.1)
WBC: 2.4 10*3/uL — ABNORMAL LOW (ref 3.9–10.3)

## 2017-05-07 LAB — COMPREHENSIVE METABOLIC PANEL
ALK PHOS: 70 U/L (ref 40–150)
ALT: 15 U/L (ref 0–55)
ANION GAP: 8 (ref 3–11)
AST: 12 U/L (ref 5–34)
Albumin: 3.5 g/dL (ref 3.5–5.0)
BILIRUBIN TOTAL: 0.4 mg/dL (ref 0.2–1.2)
BUN: 10 mg/dL (ref 7–26)
CALCIUM: 8.7 mg/dL (ref 8.4–10.4)
CO2: 26 mmol/L (ref 22–29)
CREATININE: 0.95 mg/dL (ref 0.60–1.10)
Chloride: 105 mmol/L (ref 98–109)
GFR calc non Af Amer: >60 mL/min (ref >60)
GLUCOSE: 198 mg/dL — AB (ref 70–140)
Potassium: 3.8 mmol/L (ref 3.3–4.7)
Sodium: 139 mmol/L (ref 136–145)
TOTAL PROTEIN: 6.9 g/dL (ref 6.4–8.3)

## 2017-05-07 MED ORDER — DENOSUMAB 120 MG/1.7ML ~~LOC~~ SOLN
120.0000 mg | Freq: Once | SUBCUTANEOUS | Status: AC
  Administered 2017-05-07: 120 mg via SUBCUTANEOUS

## 2017-05-07 MED ORDER — HEPARIN SOD (PORK) LOCK FLUSH 100 UNIT/ML IV SOLN
500.0000 [IU] | Freq: Once | INTRAVENOUS | Status: DC
  Filled 2017-05-07: qty 5

## 2017-05-07 MED ORDER — GOSERELIN ACETATE 3.6 MG ~~LOC~~ IMPL
3.6000 mg | DRUG_IMPLANT | Freq: Once | SUBCUTANEOUS | Status: AC
  Administered 2017-05-07: 3.6 mg via SUBCUTANEOUS
  Filled 2017-05-07: qty 3.6

## 2017-05-07 MED ORDER — SODIUM CHLORIDE 0.9% FLUSH
10.0000 mL | Freq: Once | INTRAVENOUS | Status: AC
  Administered 2017-05-07: 10 mL
  Filled 2017-05-07: qty 10

## 2017-05-07 NOTE — Patient Instructions (Signed)
Denosumab injection What is this medicine? DENOSUMAB (den oh sue mab) slows bone breakdown. Prolia is used to treat osteoporosis in women after menopause and in men. Delton See is used to treat a high calcium level due to cancer and to prevent bone fractures and other bone problems caused by multiple myeloma or cancer bone metastases. Delton See is also used to treat giant cell tumor of the bone. This medicine may be used for other purposes; ask your health care provider or pharmacist if you have questions. COMMON BRAND NAME(S): Prolia, XGEVA What should I tell my health care provider before I take this medicine? They need to know if you have any of these conditions: -dental disease -having surgery or tooth extraction -infection -kidney disease -low levels of calcium or Vitamin D in the blood -malnutrition -on hemodialysis -skin conditions or sensitivity -thyroid or parathyroid disease -an unusual reaction to denosumab, other medicines, foods, dyes, or preservatives -pregnant or trying to get pregnant -breast-feeding How should I use this medicine? This medicine is for injection under the skin. It is given by a health care professional in a hospital or clinic setting. If you are getting Prolia, a special MedGuide will be given to you by the pharmacist with each prescription and refill. Be sure to read this information carefully each time. For Prolia, talk to your pediatrician regarding the use of this medicine in children. Special care may be needed. For Delton See, talk to your pediatrician regarding the use of this medicine in children. While this drug may be prescribed for children as young as 13 years for selected conditions, precautions do apply. Overdosage: If you think you have taken too much of this medicine contact a poison control center or emergency room at once. NOTE: This medicine is only for you. Do not share this medicine with others. What if I miss a dose? It is important not to miss your  dose. Call your doctor or health care professional if you are unable to keep an appointment. What may interact with this medicine? Do not take this medicine with any of the following medications: -other medicines containing denosumab This medicine may also interact with the following medications: -medicines that lower your chance of fighting infection -steroid medicines like prednisone or cortisone This list may not describe all possible interactions. Give your health care provider a list of all the medicines, herbs, non-prescription drugs, or dietary supplements you use. Also tell them if you smoke, drink alcohol, or use illegal drugs. Some items may interact with your medicine. What should I watch for while using this medicine? Visit your doctor or health care professional for regular checks on your progress. Your doctor or health care professional may order blood tests and other tests to see how you are doing. Call your doctor or health care professional for advice if you get a fever, chills or sore throat, or other symptoms of a cold or flu. Do not treat yourself. This drug may decrease your body's ability to fight infection. Try to avoid being around people who are sick. You should make sure you get enough calcium and vitamin D while you are taking this medicine, unless your doctor tells you not to. Discuss the foods you eat and the vitamins you take with your health care professional. See your dentist regularly. Brush and floss your teeth as directed. Before you have any dental work done, tell your dentist you are receiving this medicine. Do not become pregnant while taking this medicine or for 5 months after stopping  it. Talk with your doctor or health care professional about your birth control options while taking this medicine. Women should inform their doctor if they wish to become pregnant or think they might be pregnant. There is a potential for serious side effects to an unborn child. Talk  to your health care professional or pharmacist for more information. What side effects may I notice from receiving this medicine? Side effects that you should report to your doctor or health care professional as soon as possible: -allergic reactions like skin rash, itching or hives, swelling of the face, lips, or tongue -bone pain -breathing problems -dizziness -jaw pain, especially after dental work -redness, blistering, peeling of the skin -signs and symptoms of infection like fever or chills; cough; sore throat; pain or trouble passing urine -signs of low calcium like fast heartbeat, muscle cramps or muscle pain; pain, tingling, numbness in the hands or feet; seizures -unusual bleeding or bruising -unusually weak or tired Side effects that usually do not require medical attention (report to your doctor or health care professional if they continue or are bothersome): -constipation -diarrhea -headache -joint pain -loss of appetite -muscle pain -runny nose -tiredness -upset stomach This list may not describe all possible side effects. Call your doctor for medical advice about side effects. You may report side effects to FDA at 1-800-FDA-1088. Where should I keep my medicine? This medicine is only given in a clinic, doctor's office, or other health care setting and will not be stored at home. NOTE: This sheet is a summary. It may not cover all possible information. If you have questions about this medicine, talk to your doctor, pharmacist, or health care provider.  2018 [employer] Standard (2016-05-01 19:17:21) Goserelin injection What is this medicine? GOSERELIN (GOE se rel in) is similar to a hormone found in the body. It lowers the amount of sex hormones that the body makes. Men will have lower testosterone levels and women will have lower estrogen levels while taking this medicine. In men, this medicine is used to treat prostate cancer; the injection is either given once per  month or once every 12 weeks. A once per month injection (only) is used to treat women with endometriosis, dysfunctional uterine bleeding, or advanced breast cancer. This medicine may be used for other purposes; ask your health care provider or pharmacist if you have questions. COMMON BRAND NAME(S): Zoladex What should I tell my health care provider before I take this medicine? They need to know if you have any of these conditions (some only apply to women): -diabetes -heart disease or previous heart attack -high blood pressure -high cholesterol -kidney disease -osteoporosis or low bone density -problems passing urine -spinal cord injury -stroke -tobacco smoker -an unusual or allergic reaction to goserelin, hormone therapy, other medicines, foods, dyes, or preservatives -pregnant or trying to get pregnant -breast-feeding How should I use this medicine? This medicine is for injection under the skin. It is given by a health care professional in a hospital or clinic setting. Men receive this injection once every 4 weeks or once every 12 weeks. Women will only receive the once every 4 weeks injection. Talk to your pediatrician regarding the use of this medicine in children. Special care may be needed. Overdosage: If you think you have taken too much of this medicine contact a poison control center or emergency room at once. NOTE: This medicine is only for you. Do not share this medicine with others. What if I miss a dose? It is important not   to miss your dose. Call your doctor or health care professional if you are unable to keep an appointment. What may interact with this medicine? -female hormones like estrogen -herbal or dietary supplements like black cohosh, chasteberry, or DHEA -female hormones like testosterone -prasterone This list may not describe all possible interactions. Give your health care provider a list of all the medicines, herbs, non-prescription drugs, or dietary  supplements you use. Also tell them if you smoke, drink alcohol, or use illegal drugs. Some items may interact with your medicine. What should I watch for while using this medicine? Visit your doctor or health care professional for regular checks on your progress. Your symptoms may appear to get worse during the first weeks of this therapy. Tell your doctor or healthcare professional if your symptoms do not start to get better or if they get worse after this time. Your bones may get weaker if you take this medicine for a long time. If you smoke or frequently drink alcohol you may increase your risk of bone loss. A family history of osteoporosis, chronic use of drugs for seizures (convulsions), or corticosteroids can also increase your risk of bone loss. Talk to your doctor about how to keep your bones strong. This medicine should stop regular monthly menstration in women. Tell your doctor if you continue to [facility]. Women should not become pregnant while taking this medicine or for 12 weeks after stopping this medicine. Women should inform their doctor if they wish to become pregnant or think they might be pregnant. There is a potential for serious side effects to an unborn child. Talk to your health care professional or pharmacist for more information. Do not breast-feed an infant while taking this medicine. Men should inform their doctors if they wish to father a child. This medicine may lower sperm counts. Talk to your health care professional or pharmacist for more information. What side effects may I notice from receiving this medicine? Side effects that you should report to your doctor or health care professional as soon as possible: -allergic reactions like skin rash, itching or hives, swelling of the face, lips, or tongue -bone pain -breathing problems -changes in vision -chest pain -feeling faint or lightheaded, falls -fever, chills -pain, swelling, warmth in the leg -pain, tingling,  numbness in the hands or feet -signs and symptoms of low blood pressure like dizziness; feeling faint or lightheaded, falls; unusually weak or tired -stomach pain -swelling of the ankles, feet, hands -trouble passing urine or change in the amount of urine -unusually high or low blood pressure -unusually weak or tired Side effects that usually do not require medical attention (report to your doctor or health care professional if they continue or are bothersome): -change in sex drive or performance -changes in breast size in both males and females -changes in emotions or moods -headache -hot flashes -irritation at site where injected -loss of appetite -skin problems like acne, dry skin -vaginal dryness This list may not describe all possible side effects. Call your doctor for medical advice about side effects. You may report side effects to FDA at 1-800-FDA-1088. Where should I keep my medicine? This drug is given in a hospital or clinic and will not be stored at home. NOTE: This sheet is a summary. It may not cover all possible information. If you have questions about this medicine, talk to your doctor, pharmacist, or health care provider.  2018 [employer] Standard (2013-06-16 11:10:35)

## 2017-05-16 ENCOUNTER — Other Ambulatory Visit: Payer: Self-pay | Admitting: *Deleted

## 2017-05-16 MED ORDER — LIDOCAINE-PRILOCAINE (BULK) 2.5-2.5 % CREA: 5.0000 mL | TOPICAL_CREAM | 2 refills | Status: AC | PRN

## 2017-05-17 ENCOUNTER — Ambulatory Visit: Payer: Medicare Other | Admitting: Hematology and Oncology

## 2017-05-20 MED FILL — KISQALI 200 MG DAILY DOSE: 200 | 28 days supply | Qty: 63 | Fill #1

## 2017-05-22 NOTE — Assessment & Plan Note (Signed)
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   Treatment summary:  1. Patient completed neoadjuvant dose dense Adriamycin and Cytoxan and weekly Taxol X 12 started 02/09/2014 and completed 07/01/14 2. Right double lumpectomy 2:00: IDC grade 3, 3.2 cm, high-grade DCIS, LV I present, PNI present,; 12:00: IDC grade 3; 0.8 cm, high-grade DCIS, LVI, 4/4 lymph nodes positive with ECE, ER 90%, PR 40%, HER-2 negative margin positive T2 N2 M0 stage IIIa 3. Patient is on [facility] clinical trial and she was randomized intraoperatively for NO lymph node dissection. 4.Tamoxifen 20 mg daily started 12/29/2014 stopped July 2016 for adverse effects 5. 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 Palliative XRT to the spine for cord compression --------------------------------------------------------------------------------------------------------------------------------------------------------- Current treatment: Ribociclibwith letrozole plus ovarian suppression started 01/13/2016 Bone Mets: Xgeva with calcium and vitamin D Ribociclib toxicities:  1.Fatigue 2. Neutropenia  11/08/2016: CT chest abdomen pelvis and bone scan: Stable disease no soft tissue metastases. Bone metastases unchanged from before.  Plan: Every 3 month lab work and scans and follow-up.

## 2017-05-23 ENCOUNTER — Inpatient Hospital Stay (HOSPITAL_BASED_OUTPATIENT_CLINIC_OR_DEPARTMENT_OTHER): Payer: Medicare Other | Admitting: Hematology and Oncology

## 2017-05-23 ENCOUNTER — Encounter: Payer: Self-pay | Admitting: *Deleted

## 2017-05-23 DIAGNOSIS — C50211 Malignant neoplasm of upper-inner quadrant of right female breast: Secondary | ICD-10-CM

## 2017-05-23 DIAGNOSIS — R5383 Other fatigue: Secondary | ICD-10-CM

## 2017-05-23 DIAGNOSIS — C7951 Secondary malignant neoplasm of bone: Secondary | ICD-10-CM | POA: Diagnosis not present

## 2017-05-23 DIAGNOSIS — Z170 Estrogen receptor positive status [ER+]: Secondary | ICD-10-CM | POA: Diagnosis not present

## 2017-05-23 DIAGNOSIS — C773 Secondary and unspecified malignant neoplasm of axilla and upper limb lymph nodes: Secondary | ICD-10-CM

## 2017-05-23 DIAGNOSIS — Z79811 Long term (current) use of aromatase inhibitors: Secondary | ICD-10-CM

## 2017-05-23 NOTE — Progress Notes (Signed)
Patient Care Team: System, Provider Not In as PCP - General [name], MD as Consulting Physician (General Surgery) [name], MD as Consulting Physician (Hematology and Oncology) [name], MD as Attending Physician (Radiation Oncology) Benson Norway, RN as Registered Nurse (Oncology) Sylvan Cheese, NP as Nurse Practitioner (Hematology and Oncology)  DIAGNOSIS:  Encounter Diagnosis  Name Primary?  . Malignant neoplasm of upper-inner quadrant of right breast in female, estrogen receptor positive ([place])     SUMMARY OF ONCOLOGIC HISTORY:   Breast cancer of upper-inner quadrant of right female breast ([place])   01/12/2014 Mammogram    Right breast - two lesions: #1 2:00 position 1.6 x 1.9 cm and #2 12:00 position 0.5 x 0.9 cm. Distance between the 2 was 4.5 cm, right axillary lymph node enlargement      01/12/2014 Initial Biopsy    Right breast 3 biopsies: All were IDC with DCIS ER+ PR + Ki-67 85% HER-2 negative: Right axillary lymph node also positive for metastatic cancer ER positive HER-2 negative Ki-67 80% grade 2      01/18/2014 Breast MRI    Right breast 12:00 position 2.8 x 2 x 2.2 cm: 2:00 position 1.3 x 0.8 x 0.5 cm, right axilla multiple enlarged lymph nodes 1 cm in size, right retropectoral lymph node 0.6 cm      01/18/2014 Clinical Stage    Stage IIB T2 N1      01/25/2014 Procedure    Breast/Ovarian ([facility]) reveals no clinically significant variant at ATM, BARD1, BRCA1, BRCA2, BRIP1, CDH1, CHEK2, EPCAM, FANCC, MLH1, MSH2, MSH6, NBN, PALB2, PMS2, PTEN, RAD51C, RAD51D, TP53, and XRCC2.       02/11/2014 -  Neo-Adjuvant Chemotherapy    Doxorubicin and cyclophosphamide X 4 followed by Taxol weekly x12       07/12/2014 Breast MRI    Partial response to neoadjuvant chemotherapy right breast mass decreased from 2.8 cm to 2.3 cm, 2 small masses along the medial margin also decreased; significant decrease right breast mass 11 to 12:00 and 13 mm to 8 mm,  decrease in right axilla LN      08/23/2014 Definitive Surgery    Right lumpectomy/SLNB [name]) 2:00: IDC grade 3, 3.2 cm, high-grade DCIS, LV I present, PNI present,; 12:00: IDC grade 30.8 cm, high-grade DCIS, LV I, 4/4 lymph nodes positive with ECE, ER 90%, PR 40%, HER-2 negative margin positive      08/23/2014 Pathologic Stage    Stage IIIA: ypT2 ypN2a      09/15/2014 Surgery    Reexcision of margins: clear; no evidence of malignancy      11/03/2014 - 12/14/2014 Radiation Therapy    Adjuvant XRT Isidore Moos): Right Breast, supraclavicular nodes, axillary nodes, and internal mammary nodes: 50 Gy over 25 fractions; right breast boost: 10 Gy over 5 fractions. Total dose: 60 Gy      12/29/2014 -  Anti-estrogen oral therapy    Tamoxifen 20 mg daily      04/08/2015 Survivorship    Survivorship care plan completed and mailed to patient in lieu of in person visit at her request      12/31/2015 Relapse/Recurrence    Innumerable osseous metastases with acute pathologic fracture the L4 vertebral body. 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 L5-S1 protrusion with left more than right S1 impingement      01/05/2016 - 01/12/2016 Radiation Therapy    Palliative radiation to the spine for cord compression      01/13/2016 -  Anti-estrogen oral therapy    Ribociclib 3 weeks on and 1 week off and Letrozole (with Zolodex until oophorectomy) and Xgeva for bone mets       CHIEF COMPLIANT: Follow-up on Ribociclib and letrozole  INTERVAL HISTORY: Debbie Martinez is a 42 year old with metastatic breast cancer currently in Ribociclib S letrozole.  She is tolerating the treatment fairly well apart from fatigue she is does not have any side effects.  We have not done any scans on her in a long time.  REVIEW OF SYSTEMS:   Constitutional: Denies fevers, chills or abnormal weight loss Eyes: Denies blurriness of vision Ears, nose, mouth, throat, and face: Denies mucositis or sore  throat Respiratory: Denies cough, dyspnea or wheezes Cardiovascular: Denies palpitation, chest discomfort Gastrointestinal:  Denies nausea, heartburn or change in bowel habits Skin: Denies abnormal skin rashes Lymphatics: Denies new lymphadenopathy or easy bruising Neurological:Denies numbness, tingling or new weaknesses Behavioral/Psych: Mood is stable, no new changes  Extremities: No lower extremity edema Breast:  denies any pain or lumps or nodules in either breasts All other systems were reviewed with the patient and are negative.  I have reviewed the past medical history, past surgical history, social history and family history with the patient and they are unchanged from previous note.  ALLERGIES:  is allergic to lisinopril and tamoxifen.  MEDICATIONS:  Current Outpatient Medications  Medication Sig Dispense Refill  . ACCU-CHEK AVIVA PLUS test strip USE AS DIRECTED 4 TIMES A DAY 1 each 12  . ACCU-CHEK SOFTCLIX LANCETS lancets 100 each by Other route 4 (four) times daily. Use as instructed 100 each 0  . atenolol (TENORMIN) 50 MG tablet Take 50 mg by mouth daily.    . blood glucose meter kit and supplies KIT Dispense based on patient and insurance preference. Use up to four times daily as directed. (FOR ICD-9 250.00, 250.01). 1 each 0  . Cholecalciferol (VITAMIN D-3 PO) Take 1 capsule by mouth daily.    . diazepam (VALIUM) 5 MG tablet Take 1 tablet (5 mg total) by mouth 2 (two) times daily. (Patient not taking: Reported on 03/14/2016) 10 tablet 0  . ENSURE (ENSURE) Take 237 mLs by mouth 2 (two) times daily between meals.     . gabapentin (NEURONTIN) 300 MG capsule Take 1 capsule (300 mg total) by mouth 3 (three) times daily. 90 capsule 5  . Iron-Vitamins (GERITOL) LIQD Take 5 mLs by mouth daily.    Marland Kitchen KISQALI 200 DOSE 200 MG tablet TAKE 3 TABLETS BY MOUTH DAILY. 3 WEEKS ON AND 1 WEEK OFF 63 tablet 6  . letrozole (FEMARA) 2.5 MG tablet TAKE 1 TABLET BY MOUTH ONCE DAILY 90 tablet 3  .  Lidocaine-Prilocaine, Bulk, 2.5-2.5 % CREA Apply 5 mLs topically as needed. 2 hours before use, cover with plawtic wrap. 30 g 2  . Magnesium Salicylate (DOANS PILLS PO) Take 2 tablets by mouth daily as needed (for pain).    . metFORMIN (GLUCOPHAGE) 500 MG tablet Take 1 tablet (500 mg total) by mouth 2 (two) times daily with a meal. (Patient not taking: Reported on 05/30/2016) 60 tablet 0  . methocarbamol (ROBAXIN) 500 MG tablet Take 1 tablet (500 mg total) by mouth 2 (two) times daily as needed for muscle spasms. (Patient taking differently: Take 1,000 mg by mouth 4 (four) times daily as needed for muscle spasms. ) 20 tablet 0  . omeprazole (PRILOSEC) 20 MG capsule Take 1 capsule (20 mg total) by mouth daily. Take while using dexamethasone  to prevent heartburn. 30 capsule 2  . oxyCODONE-acetaminophen (PERCOCET/ROXICET) 5-325 MG tablet Take 1 tablet by mouth every 6 (six) hours as needed for severe pain. 120 tablet 0  . polyethylene glycol (MIRALAX / GLYCOLAX) packet Take 17 g by mouth daily. (Patient not taking: Reported on 05/30/2016) 14 each 0  . Ribociclib Succinate 200 MG TABS Take 600 mg by mouth daily. 3 weeks on 1 week off 63 tablet 6  . traMADol (ULTRAM) 50 MG tablet Take 50-100 mg by mouth every 6 (six) hours as needed (for pain).     No current facility-administered medications for this visit.     PHYSICAL EXAMINATION: ECOG PERFORMANCE STATUS: 1 - Symptomatic but completely ambulatory  Vitals:   05/23/17 1155  BP: (!) 151/100  Pulse: 100  Resp: 20  Temp: 99.1 F (37.3 C)  SpO2: 100%   Filed Weights   05/23/17 1155  Weight: 178 lb 9.6 oz (81 kg)    GENERAL:alert, no distress and comfortable SKIN: skin color, texture, turgor are normal, no rashes or significant lesions EYES: normal, Conjunctiva are pink and non-injected, sclera clear OROPHARYNX:no exudate, no erythema and lips, buccal mucosa, and tongue normal  NECK: supple, thyroid normal size, non-tender, without  nodularity LYMPH:  no palpable lymphadenopathy in the cervical, axillary or inguinal LUNGS: clear to auscultation and percussion with normal breathing effort HEART: regular rate & rhythm and no murmurs and no lower extremity edema ABDOMEN:abdomen soft, non-tender and normal bowel sounds MUSCULOSKELETAL:no cyanosis of digits and no clubbing  NEURO: alert & oriented x 3 with fluent speech, no focal motor/sensory deficits EXTREMITIES: No lower extremity edema BREAST: No palpable masses or nodules in either right or left breasts. No palpable axillary supraclavicular or infraclavicular adenopathy no breast tenderness or nipple discharge. (exam performed in the presence of a chaperone)  LABORATORY DATA:  I have reviewed the data as listed CMP Latest Ref Rng & Units 05/07/2017 04/09/2017 02/11/2017  Glucose 70 - 140 mg/dL 198(H) 213(H) 127  BUN 7 - 26 mg/dL 10 14.9 10.6  Creatinine 0.60 - 1.10 mg/dL 0.95 0.9 0.8  Sodium 136 - 145 mmol/L 139 139 142  Potassium 3.3 - 4.7 mmol/L 3.8 3.3(L) 3.3(L)  Chloride 98 - 109 mmol/L 105 - -  CO2 22 - 29 mmol/L _0 Calcium 8.4 - 10.4 mg/dL 8.7 9.0 9.8  Total Protein 6.4 - 8.3 g/dL 6.9 7.1 7.6  Total Bilirubin 0.2 - 1.2 mg/dL 0.4 0.37 0.51  Alkaline Phos 40 - 150 U/L 70 63 54  AST 5 - 34 U/L _1 ALT 0 - 55 U/L _2 Lab Results  Component Value Date   WBC 2.4 (L) 05/07/2017   HGB 9.2 (L) 05/07/2017   HCT 29.5 (L) 05/07/2017   MCV 96.1 05/07/2017   PLT 312 05/07/2017   NEUTROABS 1.7 05/07/2017    ASSESSMENT & PLAN:  Breast cancer of upper-inner quadrant of right female breast ([place]) 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   Treatment summary:  1. Patient completed neoadjuvant dose dense Adriamycin and Cytoxan and weekly Taxol X 12 started 02/09/2014 and completed 07/01/14 2. Right double  lumpectomy 2:00: IDC grade 3, 3.2 cm, high-grade DCIS, LV I present, PNI present,; 12:00: IDC grade 3; 0.8 cm, high-grade DCIS, LVI, 4/4 lymph nodes positive with ECE, ER 90%, PR 40%, HER-2 negative margin  positive T2 N2 M0 stage IIIa 3. Patient is on [facility] clinical trial and she was randomized intraoperatively for NO lymph node dissection. 4.Tamoxifen 20 mg daily started 12/29/2014 stopped July 2016 for adverse effects 5. 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 Palliative XRT to the spine for cord compression --------------------------------------------------------------------------------------------------------------------------------------------------------- Current treatment: Ribociclibwith letrozole plus ovarian suppression started 01/13/2016 Bone Mets: Xgeva with calcium and vitamin D Ribociclib toxicities:  1.Fatigue 2. Neutropenia  11/08/2016: CT chest abdomen pelvis and bone scan: Stable disease no soft tissue metastases. Bone metastases unchanged from before.  Plan: CT scans and follow-up in 1 week   I spent 25 minutes talking to the patient of which more than half was spent in counseling and coordination of care.  No orders of the defined types were placed in this encounter.  The patient has a good understanding of the overall plan. she agrees with it. she will call with any problems that may develop before the next visit here.   Harriette Ohara, MD 05/23/17

## 2017-05-24 ENCOUNTER — Telehealth: Payer: Self-pay | Admitting: Hematology and Oncology

## 2017-05-24 NOTE — Telephone Encounter (Signed)
Mailed patient calendar of upcoming February appointments per 2/1 sch message.

## 2017-05-28 MED FILL — LETROZOLE 2.5 MG TABLET: 2.5 | 90 days supply | Qty: 90 | Fill #1

## 2017-06-06 ENCOUNTER — Encounter (HOSPITAL_COMMUNITY): Payer: Medicare Other

## 2017-06-06 ENCOUNTER — Encounter (HOSPITAL_COMMUNITY)
Admission: RE | Admit: 2017-06-06 | Discharge: 2017-06-06 | Disposition: A | Discharge status: Home / Self Care | Payer: Medicare Other | Source: Ambulatory Visit | Attending: Hematology and Oncology | Admitting: Hematology and Oncology

## 2017-06-06 ENCOUNTER — Ambulatory Visit (HOSPITAL_COMMUNITY)
Admission: RE | Admit: 2017-06-06 | Discharge: 2017-06-06 | Disposition: A | Discharge status: Home / Self Care | Payer: Medicare Other | Source: Ambulatory Visit | Attending: Hematology and Oncology | Admitting: Hematology and Oncology

## 2017-06-06 DIAGNOSIS — C50919 Malignant neoplasm of unspecified site of unspecified female breast: Secondary | ICD-10-CM | POA: Diagnosis not present

## 2017-06-06 DIAGNOSIS — M4856XA Collapsed vertebra, not elsewhere classified, lumbar region, initial encounter for fracture: Secondary | ICD-10-CM | POA: Diagnosis not present

## 2017-06-06 DIAGNOSIS — C7951 Secondary malignant neoplasm of bone: Secondary | ICD-10-CM | POA: Insufficient documentation

## 2017-06-06 DIAGNOSIS — K921 Melena: Secondary | ICD-10-CM | POA: Diagnosis not present

## 2017-06-06 DIAGNOSIS — R590 Localized enlarged lymph nodes: Secondary | ICD-10-CM | POA: Diagnosis not present

## 2017-06-06 DIAGNOSIS — Z5111 Encounter for antineoplastic chemotherapy: Secondary | ICD-10-CM | POA: Diagnosis not present

## 2017-06-06 MED ORDER — TECHNETIUM TC 99M MEDRONATE IV KIT
21.8000 | PACK | Freq: Once | INTRAVENOUS | Status: AC | PRN
  Administered 2017-06-06: 21.8 via INTRAVENOUS

## 2017-06-06 MED ORDER — IOPAMIDOL (ISOVUE-300) INJECTION 61%
INTRAVENOUS | Status: AC
  Filled 2017-06-06: qty 30

## 2017-06-06 MED ORDER — IOPAMIDOL (ISOVUE-300) INJECTION 61%
100.0000 mL | Freq: Once | INTRAVENOUS | Status: AC | PRN
  Administered 2017-06-06: 100 mL via INTRAVENOUS

## 2017-06-06 MED ORDER — IOPAMIDOL (ISOVUE-300) INJECTION 61%
30.0000 mL | Freq: Once | INTRAVENOUS | Status: AC | PRN
  Administered 2017-06-06: 30 mL via ORAL

## 2017-06-06 MED ORDER — IOPAMIDOL (ISOVUE-300) INJECTION 61%
INTRAVENOUS | Status: AC
  Filled 2017-06-06: qty 100

## 2017-06-10 ENCOUNTER — Inpatient Hospital Stay: Payer: Medicare Other | Attending: Hematology and Oncology | Admitting: Hematology and Oncology

## 2017-06-10 ENCOUNTER — Encounter: Payer: Self-pay | Admitting: *Deleted

## 2017-06-10 DIAGNOSIS — C773 Secondary and unspecified malignant neoplasm of axilla and upper limb lymph nodes: Secondary | ICD-10-CM | POA: Insufficient documentation

## 2017-06-10 DIAGNOSIS — C50211 Malignant neoplasm of upper-inner quadrant of right female breast: Secondary | ICD-10-CM | POA: Insufficient documentation

## 2017-06-10 DIAGNOSIS — Z170 Estrogen receptor positive status [ER+]: Secondary | ICD-10-CM

## 2017-06-10 DIAGNOSIS — C787 Secondary malignant neoplasm of liver and intrahepatic bile duct: Secondary | ICD-10-CM | POA: Diagnosis not present

## 2017-06-10 DIAGNOSIS — C7951 Secondary malignant neoplasm of bone: Secondary | ICD-10-CM | POA: Diagnosis not present

## 2017-06-10 NOTE — Progress Notes (Signed)
Patient Care Team: Lucianne Lei, MD as PCP - General (Family Medicine) [name], MD as Consulting Physician (General Surgery) [name], MD as Consulting Physician (Hematology and Oncology) [name], MD as Attending Physician (Radiation Oncology) Benson Norway, RN as Registered Nurse (Oncology) Sylvan Cheese, NP as Nurse Practitioner (Hematology and Oncology)  DIAGNOSIS:  Encounter Diagnosis  Name Primary?  . Malignant neoplasm of upper-inner quadrant of right breast in female, estrogen receptor positive ([place])     SUMMARY OF ONCOLOGIC HISTORY:   Breast cancer of upper-inner quadrant of right female breast ([place])   01/12/2014 Mammogram    Right breast - two lesions: #1 2:00 position 1.6 x 1.9 cm and #2 12:00 position 0.5 x 0.9 cm. Distance between the 2 was 4.5 cm, right axillary lymph node enlargement      01/12/2014 Initial Biopsy    Right breast 3 biopsies: All were IDC with DCIS ER+ PR + Ki-67 85% HER-2 negative: Right axillary lymph node also positive for metastatic cancer ER positive HER-2 negative Ki-67 80% grade 2      01/18/2014 Breast MRI    Right breast 12:00 position 2.8 x 2 x 2.2 cm: 2:00 position 1.3 x 0.8 x 0.5 cm, right axilla multiple enlarged lymph nodes 1 cm in size, right retropectoral lymph node 0.6 cm      01/18/2014 Clinical Stage    Stage IIB T2 N1      01/25/2014 Procedure    Breast/Ovarian ([facility]) reveals no clinically significant variant at ATM, BARD1, BRCA1, BRCA2, BRIP1, CDH1, CHEK2, EPCAM, FANCC, MLH1, MSH2, MSH6, NBN, PALB2, PMS2, PTEN, RAD51C, RAD51D, TP53, and XRCC2.       02/11/2014 -  Neo-Adjuvant Chemotherapy    Doxorubicin and cyclophosphamide X 4 followed by Taxol weekly x12       07/12/2014 Breast MRI    Partial response to neoadjuvant chemotherapy right breast mass decreased from 2.8 cm to 2.3 cm, 2 small masses along the medial margin also decreased; significant decrease right breast mass 11 to 12:00 and 13 mm  to 8 mm, decrease in right axilla LN      08/23/2014 Definitive Surgery    Right lumpectomy/SLNB [name]) 2:00: IDC grade 3, 3.2 cm, high-grade DCIS, LV I present, PNI present,; 12:00: IDC grade 30.8 cm, high-grade DCIS, LV I, 4/4 lymph nodes positive with ECE, ER 90%, PR 40%, HER-2 negative margin positive      08/23/2014 Pathologic Stage    Stage IIIA: ypT2 ypN2a      09/15/2014 Surgery    Reexcision of margins: clear; no evidence of malignancy      11/03/2014 - 12/14/2014 Radiation Therapy    Adjuvant XRT Isidore Moos): Right Breast, supraclavicular nodes, axillary nodes, and internal mammary nodes: 50 Gy over 25 fractions; right breast boost: 10 Gy over 5 fractions. Total dose: 60 Gy      12/29/2014 -  Anti-estrogen oral therapy    Tamoxifen 20 mg daily      04/08/2015 Survivorship    Survivorship care plan completed and mailed to patient in lieu of in person visit at her request      12/31/2015 Relapse/Recurrence    Innumerable osseous metastases with acute pathologic fracture the L4 vertebral body. 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 L5-S1 protrusion with left more than right S1 impingement      01/05/2016 - 01/12/2016 Radiation Therapy    Palliative radiation to the spine for cord compression      01/13/2016 -  Anti-estrogen oral therapy    Ribociclib 3 weeks on and 1 week off and Letrozole (with Zolodex until oophorectomy) and Xgeva for bone mets       CHIEF COMPLIANT: Follow-up to discuss results of the recent CT scans  INTERVAL HISTORY: Debbie Martinez is a 41 year old with above-mentioned history metastatic breast cancer who is currently in Ribociclib along with letrozole.  She is here today to discuss results of the recently performed scans.  She appears to be under the weather from upper respiratory infection.  She denies any new pain or discomfort.  REVIEW OF SYSTEMS:   Constitutional: Denies fevers, chills or abnormal weight loss Eyes:  Denies blurriness of vision Ears, nose, mouth, throat, and face: Denies mucositis or sore throat Respiratory: Denies cough, dyspnea or wheezes Cardiovascular: Denies palpitation, chest discomfort Gastrointestinal:  Denies nausea, heartburn or change in bowel habits Skin: Denies abnormal skin rashes Lymphatics: Denies new lymphadenopathy or easy bruising Neurological:Denies numbness, tingling or new weaknesses Behavioral/Psych: Mood is stable, no new changes  Extremities: No lower extremity edema All other systems were reviewed with the patient and are negative.  I have reviewed the past medical history, past surgical history, social history and family history with the patient and they are unchanged from previous note.  ALLERGIES:  is allergic to lisinopril and tamoxifen.  MEDICATIONS:  Current Outpatient Medications  Medication Sig Dispense Refill  . ACCU-CHEK AVIVA PLUS test strip USE AS DIRECTED 4 TIMES A DAY 1 each 12  . ACCU-CHEK SOFTCLIX LANCETS lancets 100 each by Other route 4 (four) times daily. Use as instructed 100 each 0  . atenolol (TENORMIN) 50 MG tablet Take 50 mg by mouth daily.    . blood glucose meter kit and supplies KIT Dispense based on patient and insurance preference. Use up to four times daily as directed. (FOR ICD-9 250.00, 250.01). 1 each 0  . Cholecalciferol (VITAMIN D-3 PO) Take 1 capsule by mouth daily.    . diazepam (VALIUM) 5 MG tablet Take 1 tablet (5 mg total) by mouth 2 (two) times daily. (Patient not taking: Reported on 03/14/2016) 10 tablet 0  . ENSURE (ENSURE) Take 237 mLs by mouth 2 (two) times daily between meals.     . gabapentin (NEURONTIN) 300 MG capsule Take 1 capsule (300 mg total) by mouth 3 (three) times daily. 90 capsule 5  . Iron-Vitamins (GERITOL) LIQD Take 5 mLs by mouth daily.    Marland Kitchen KISQALI 200 DOSE 200 MG tablet TAKE 3 TABLETS BY MOUTH DAILY. 3 WEEKS ON AND 1 WEEK OFF 63 tablet 6  . letrozole (FEMARA) 2.5 MG tablet TAKE 1 TABLET BY MOUTH  ONCE DAILY 90 tablet 3  . Lidocaine-Prilocaine, Bulk, 2.5-2.5 % CREA Apply 5 mLs topically as needed. 2 hours before use, cover with plawtic wrap. 30 g 2  . Magnesium Salicylate (DOANS PILLS PO) Take 2 tablets by mouth daily as needed (for pain).    . metFORMIN (GLUCOPHAGE) 500 MG tablet Take 1 tablet (500 mg total) by mouth 2 (two) times daily with a meal. (Patient not taking: Reported on 05/30/2016) 60 tablet 0  . methocarbamol (ROBAXIN) 500 MG tablet Take 1 tablet (500 mg total) by mouth 2 (two) times daily as needed for muscle spasms. (Patient taking differently: Take 1,000 mg by mouth 4 (four) times daily as needed for muscle spasms. ) 20 tablet 0  . omeprazole (PRILOSEC) 20 MG capsule Take 1 capsule (20 mg total) by mouth daily. Take while using dexamethasone to prevent  heartburn. 30 capsule 2  . oxyCODONE-acetaminophen (PERCOCET/ROXICET) 5-325 MG tablet Take 1 tablet by mouth every 6 (six) hours as needed for severe pain. 120 tablet 0  . polyethylene glycol (MIRALAX / GLYCOLAX) packet Take 17 g by mouth daily. (Patient not taking: Reported on 05/30/2016) 14 each 0  . Ribociclib Succinate 200 MG TABS Take 600 mg by mouth daily. 3 weeks on 1 week off 63 tablet 6  . traMADol (ULTRAM) 50 MG tablet Take 50-100 mg by mouth every 6 (six) hours as needed (for pain).     No current facility-administered medications for this visit.     PHYSICAL EXAMINATION: ECOG PERFORMANCE STATUS: 1 - Symptomatic but completely ambulatory  Vitals:   06/10/17 1137  BP: 123/88  Pulse: (!) 101  Resp: 14  Temp: 98.5 F (36.9 C)  SpO2: 98%   Filed Weights   06/10/17 1137  Weight: 179 lb 4.8 oz (81.3 kg)    GENERAL:alert, no distress and comfortable SKIN: skin color, texture, turgor are normal, no rashes or significant lesions EYES: normal, Conjunctiva are pink and non-injected, sclera clear OROPHARYNX:no exudate, no erythema and lips, buccal mucosa, and tongue normal  NECK: supple, thyroid normal size,  non-tender, without nodularity LYMPH:  no palpable lymphadenopathy in the cervical, axillary or inguinal LUNGS: clear to auscultation and percussion with normal breathing effort HEART: regular rate & rhythm and no murmurs and no lower extremity edema ABDOMEN:abdomen soft, non-tender and normal bowel sounds MUSCULOSKELETAL:no cyanosis of digits and no clubbing  NEURO: alert & oriented x 3 with fluent speech, no focal motor/sensory deficits EXTREMITIES: No lower extremity edema  LABORATORY DATA:  I have reviewed the data as listed CMP Latest Ref Rng & Units 05/07/2017 04/09/2017 02/11/2017  Glucose 70 - 140 mg/dL 198(H) 213(H) 127  BUN 7 - 26 mg/dL 10 14.9 10.6  Creatinine 0.60 - 1.10 mg/dL 0.95 0.9 0.8  Sodium 136 - 145 mmol/L 139 139 142  Potassium 3.3 - 4.7 mmol/L 3.8 3.3(L) 3.3(L)  Chloride 98 - 109 mmol/L 105 - -  CO2 22 - 29 mmol/L _0 Calcium 8.4 - 10.4 mg/dL 8.7 9.0 9.8  Total Protein 6.4 - 8.3 g/dL 6.9 7.1 7.6  Total Bilirubin 0.2 - 1.2 mg/dL 0.4 0.37 0.51  Alkaline Phos 40 - 150 U/L 70 63 54  AST 5 - 34 U/L _1 ALT 0 - 55 U/L _2 Lab Results  Component Value Date   WBC 2.4 (L) 05/07/2017   HGB 9.2 (L) 05/07/2017   HCT 29.5 (L) 05/07/2017   MCV 96.1 05/07/2017   PLT 312 05/07/2017   NEUTROABS 1.7 05/07/2017    ASSESSMENT & PLAN:  Breast cancer of upper-inner quadrant of right female breast ([place]) 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   Treatment summary:  1. Patient completed neoadjuvant dose dense Adriamycin and Cytoxan and weekly Taxol X 12 started 02/09/2014 and completed 07/01/14 2. Right double lumpectomy 2:00: IDC grade 3, 3.2 cm, high-grade DCIS, LV I present, PNI present,; 12:00: IDC grade 3; 0.8 cm, high-grade DCIS, LVI, 4/4 lymph nodes positive with ECE, ER 90%, PR 40%, HER-2 negative margin positive  T2 N2 M0 stage IIIa 3. Patient is on [employer] clinical trial and she was randomized intraoperatively for NO lymph node dissection. 4.Tamoxifen 20 mg daily started 12/29/2014 stopped July 2016 for adverse effects  5. 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 Palliative XRT to the spine for cord compression --------------------------------------------------------------------------------------------------------------------------------------------------------- Current treatment: Ribociclibwith letrozole plus ovarian suppression started 01/13/2016 Bone Mets: Xgeva with calcium and vitamin D  CT chest abdomen pelvis: There are over 20 masses scattered throughout the liver which are new compared to the prior exam compatible with progression of disease.  Several borderline enlarged lymph nodes including porta hepatis node and right external iliac nodes are increased in size.  Widespread lytic and sclerotic lesions similar.  L4 fracture stable  Recommendation: 1.  Biopsy of the liver metastases 2. if the pathology is consistent with an estrogen receptor positive tumor, we can consider treatment with exemestane and everolimus versus systemic chemotherapy with [place]  Goals of care: Palliation Return to clinic after the biopsy. I spent 40 minutes talking to the patient of which more than half was spent in counseling and coordination of care.  Orders Placed This Encounter  Procedures  . US BIOPSY (LIVER)    Standing Status:   Future    Standing Expiration Date:   08/09/2018    Order Specific Question:   Lab orders requested (DO NOT place separate lab orders, these will be automatically ordered during procedure specimen collection):    Answer:   Cytology - Non Pap    Order Specific Question:   Lab orders requested (DO NOT place separate lab orders, these will be automatically ordered during  procedure specimen collection):    Answer:   Surgical Pathology    Order Specific Question:   Reason for Exam (SYMPTOM  OR DIAGNOSIS REQUIRED)    Answer:   Metastatic breast cancer Liver biopsy send for ER, PR and Her 2    Order Specific Question:   Preferred imaging location?    Answer:   [facility]   The patient has a good understanding of the overall plan. she agrees with it. she will call with any problems that may develop before the next visit here.   Harriette Ohara, MD 06/10/17

## 2017-06-10 NOTE — Assessment & Plan Note (Signed)
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   Treatment summary:  1. Patient completed neoadjuvant dose dense Adriamycin and Cytoxan and weekly Taxol X 12 started 02/09/2014 and completed 07/01/14 2. Right double lumpectomy 2:00: IDC grade 3, 3.2 cm, high-grade DCIS, LV I present, PNI present,; 12:00: IDC grade 3; 0.8 cm, high-grade DCIS, LVI, 4/4 lymph nodes positive with ECE, ER 90%, PR 40%, HER-2 negative margin positive T2 N2 M0 stage IIIa 3. Patient is on [employer] clinical trial and she was randomized intraoperatively for NO lymph node dissection. 4.Tamoxifen 20 mg daily started 12/29/2014 stopped July 2016 for adverse effects 5. 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 Palliative XRT to the spine for cord compression --------------------------------------------------------------------------------------------------------------------------------------------------------- Current treatment: Ribociclibwith letrozole plus ovarian suppression started 01/13/2016 Bone Mets: Xgeva with calcium and vitamin D  CT chest abdomen pelvis: There are over 20 masses scattered throughout the liver which are new compared to the prior exam compatible with progression of disease.  Several borderline enlarged lymph nodes including porta hepatis node and right external iliac nodes are increased in size.  Widespread lytic and sclerotic lesions similar.  L4 fracture stable  Recommendation: 1.  Biopsy of the liver metastases 2. if the pathology is consistent with an estrogen receptor positive tumor, we can consider treatment with exemestane and everolimus versus systemic chemotherapy with [place]  Return to clinic after the biopsy.

## 2017-06-12 ENCOUNTER — Telehealth: Payer: Self-pay | Admitting: Hematology and Oncology

## 2017-06-12 NOTE — Telephone Encounter (Signed)
Spoke to patient regarding upcoming march appointments per 2/19 sch message.

## 2017-06-16 ENCOUNTER — Other Ambulatory Visit: Payer: Self-pay | Admitting: Radiology

## 2017-06-17 ENCOUNTER — Ambulatory Visit (HOSPITAL_COMMUNITY)
Admission: RE | Admit: 2017-06-17 | Discharge: 2017-06-17 | Disposition: A | Discharge status: Home / Self Care | Payer: Medicare Other | Source: Ambulatory Visit | Attending: Hematology and Oncology | Admitting: Hematology and Oncology

## 2017-06-17 ENCOUNTER — Encounter: Payer: Self-pay | Admitting: Hematology and Oncology

## 2017-06-17 ENCOUNTER — Encounter (HOSPITAL_COMMUNITY): Payer: Self-pay

## 2017-06-17 DIAGNOSIS — Z170 Estrogen receptor positive status [ER+]: Secondary | ICD-10-CM | POA: Diagnosis not present

## 2017-06-17 DIAGNOSIS — C50211 Malignant neoplasm of upper-inner quadrant of right female breast: Secondary | ICD-10-CM | POA: Diagnosis not present

## 2017-06-17 DIAGNOSIS — K7689 Other specified diseases of liver: Secondary | ICD-10-CM | POA: Diagnosis not present

## 2017-06-17 DIAGNOSIS — C787 Secondary malignant neoplasm of liver and intrahepatic bile duct: Secondary | ICD-10-CM | POA: Insufficient documentation

## 2017-06-17 DIAGNOSIS — C50919 Malignant neoplasm of unspecified site of unspecified female breast: Secondary | ICD-10-CM | POA: Diagnosis not present

## 2017-06-17 LAB — COMPREHENSIVE METABOLIC PANEL
ALK PHOS: 75 U/L (ref 38–126)
ALT: 19 U/L (ref 14–54)
AST: 22 U/L (ref 15–41)
Albumin: 4.1 g/dL (ref 3.5–5.0)
Anion gap: 13 (ref 5–15)
BUN: 12 mg/dL (ref 6–20)
CALCIUM: 8.6 mg/dL — AB (ref 8.9–10.3)
CHLORIDE: 105 mmol/L (ref 101–111)
CO2: 23 mmol/L (ref 22–32)
CREATININE: 0.81 mg/dL (ref 0.44–1.00)
Glucose, Bld: 144 mg/dL — ABNORMAL HIGH (ref 65–99)
Potassium: 3 mmol/L — ABNORMAL LOW (ref 3.5–5.1)
Sodium: 141 mmol/L (ref 135–145)
Total Bilirubin: 0.7 mg/dL (ref 0.3–1.2)
Total Protein: 7.6 g/dL (ref 6.5–8.1)

## 2017-06-17 LAB — CBC WITH DIFFERENTIAL/PLATELET
BASOS ABS: 0 10*3/uL (ref 0.0–0.1)
Basophils Relative: 0 %
Eosinophils Absolute: 0 10*3/uL (ref 0.0–0.7)
Eosinophils Relative: 1 %
HCT: 30 % — ABNORMAL LOW (ref 36.0–46.0)
HEMOGLOBIN: 9.6 g/dL — AB (ref 12.0–15.0)
LYMPHS ABS: 0.5 10*3/uL — AB (ref 0.7–4.0)
LYMPHS PCT: 20 %
MCH: 30.6 pg (ref 26.0–34.0)
MCHC: 32 g/dL (ref 30.0–36.0)
MCV: 95.5 fL (ref 78.0–100.0)
Monocytes Absolute: 0.2 10*3/uL (ref 0.1–1.0)
Monocytes Relative: 7 %
NEUTROS ABS: 1.7 10*3/uL (ref 1.7–7.7)
NEUTROS PCT: 72 %
Platelets: 253 10*3/uL (ref 150–400)
RBC: 3.14 MIL/uL — AB (ref 3.87–5.11)
RDW: 15.7 % — ABNORMAL HIGH (ref 11.5–15.5)
WBC: 2.4 10*3/uL — AB (ref 4.0–10.5)

## 2017-06-17 LAB — PROTIME-INR
INR: 1.02
Prothrombin Time: 13.3 seconds (ref 11.4–15.2)

## 2017-06-17 LAB — GLUCOSE, CAPILLARY: Glucose-Capillary: 140 mg/dL — ABNORMAL HIGH (ref 65–99)

## 2017-06-17 MED ORDER — FENTANYL CITRATE (PF) 100 MCG/2ML IJ SOLN
25.0000 ug | Freq: Once | INTRAMUSCULAR | Status: AC
  Administered 2017-06-17: 25 ug via INTRAVENOUS

## 2017-06-17 MED ORDER — FENTANYL CITRATE (PF) 100 MCG/2ML IJ SOLN
INTRAMUSCULAR | Status: AC
  Filled 2017-06-17: qty 2

## 2017-06-17 MED ORDER — MIDAZOLAM HCL 2 MG/2ML IJ SOLN
INTRAMUSCULAR | Status: AC
  Filled 2017-06-17: qty 2

## 2017-06-17 MED ORDER — LIDOCAINE HCL (PF) 1 % IJ SOLN
INTRAMUSCULAR | Status: AC | PRN
  Administered 2017-06-17: 20 mL

## 2017-06-17 MED ORDER — FENTANYL CITRATE (PF) 100 MCG/2ML IJ SOLN
INTRAMUSCULAR | Status: AC
  Administered 2017-06-17: 25 ug via INTRAVENOUS
  Filled 2017-06-17: qty 2

## 2017-06-17 MED ORDER — SODIUM CHLORIDE 0.9 % IV SOLN
INTRAVENOUS | Status: DC
  Administered 2017-06-17: 12:00:00 via INTRAVENOUS

## 2017-06-17 MED ORDER — HYDROCODONE-ACETAMINOPHEN 5-325 MG PO TABS
1.0000 | ORAL_TABLET | Freq: Once | ORAL | Status: AC
  Administered 2017-06-17: 1 via ORAL
  Filled 2017-06-17: qty 1

## 2017-06-17 MED ORDER — GELATIN ABSORBABLE 12-7 MM EX MISC
CUTANEOUS | Status: AC
  Filled 2017-06-17: qty 1

## 2017-06-17 MED ORDER — LIDOCAINE-EPINEPHRINE (PF) 2 %-1:200000 IJ SOLN
INTRAMUSCULAR | Status: DC
Start: 2017-06-17 — End: 2017-06-18
  Filled 2017-06-17: qty 20

## 2017-06-17 MED ORDER — FENTANYL CITRATE (PF) 100 MCG/2ML IJ SOLN
INTRAMUSCULAR | Status: AC | PRN
  Administered 2017-06-17 (×2): 50 ug via INTRAVENOUS

## 2017-06-17 MED ORDER — MIDAZOLAM HCL 2 MG/2ML IJ SOLN
INTRAMUSCULAR | Status: AC | PRN
  Administered 2017-06-17 (×2): 1 mg via INTRAVENOUS

## 2017-06-17 NOTE — Consult Note (Signed)
Chief Complaint: Patient was seen in consultation today for image guided liver lesion biopsy  Referring Physician(s): [place]  Supervising Physician: Sandi Mariscal  Patient Status: [facility] - Out-pt  History of Present Illness: Debbie Martinez is a 42 y.o. female with history of right breast cancer, initially diagnosed in 2015, status post surgery, chemoradiation and antiestrogen therapy.  She now presents with disease progression and request received from oncology for image guided liver lesion biopsy for further evaluation.  Past Medical History:  Diagnosis Date  . Anxiety    gets sweaty and short of breath  . Breast cancer ([place]) 01/20/14   triple positive  . Diabetes   . GERD (gastroesophageal reflux disease)    does not take anything  . History of radiation therapy 11/03/14- 12/14/14   Right Breast, supraclavicular nodes, axillary nodes, and internal mammary nodes.  . History of radiation therapy 01/04/16- 01/23/16   Spine metastasis L2-S2  . Hypertension   . Shortness of breath    'anxiety'    Past Surgical History:  Procedure Laterality Date  . BREAST LUMPECTOMY WITH NEEDLE LOCALIZATION AND AXILLARY SENTINEL LYMPH NODE BX Right 08/23/2014   Procedure: RIGHT BREAST LUMPECTOMY WITH NEEDLE LOCALIZATION(X'S 2)AND RIGHT AXILLARY SENTINEL LYMPH NODE Biopies;  Surgeon: Autumn Messing III, MD;  Location: [place];  Service: General;  Laterality: Right;  . CESAREAN SECTION     2008  . KELOID EXCISION Left    ear  . PORTACATH PLACEMENT Left 01/26/2014   Procedure: INSERTION PORT-A-CATH;  Surgeon: Autumn Messing III, MD;  Location: [place];  Service: General;  Laterality: Left;  . RE-EXCISION OF BREAST CANCER,SUPERIOR MARGINS Right 09/15/2014   Procedure: RE-EXCISION OF RIGHT BREAST CANCER,SUPERIOR AND INFERIOR MARGINS;  Surgeon: Autumn Messing III, MD;  Location: [place];  Service: General;  Laterality: Right;    Allergies: Lisinopril and Tamoxifen  Medications: Prior to  Admission medications   Medication Sig Start Date End Date Taking? Authorizing Provider  ACCU-CHEK AVIVA PLUS test strip USE AS DIRECTED 4 TIMES A DAY 12/27/16  Yes [name], MD  ACCU-CHEK SOFTCLIX LANCETS lancets 100 each by Other route 4 (four) times daily. Use as instructed 11/12/16  Yes [name], MD  blood glucose meter kit and supplies KIT Dispense based on patient and insurance preference. Use up to four times daily as directed. (FOR ICD-9 250.00, 250.01). 01/12/16  Yes [name], MD  Cholecalciferol (VITAMIN D-3 PO) Take 1 capsule by mouth daily.   Yes [provider]  ENSURE (ENSURE) Take 237 mLs by mouth 2 (two) times daily between meals.    Yes [provider]  gabapentin (NEURONTIN) 300 MG capsule Take 1 capsule (300 mg total) by mouth 3 (three) times daily. 05/30/16  Yes [name], MD  Iron-Vitamins (GERITOL) LIQD Take 5 mLs by mouth daily.   Yes [provider]  KISQALI 200 DOSE 200 MG tablet TAKE 3 TABLETS BY MOUTH DAILY. 3 WEEKS ON AND 1 WEEK OFF 04/15/17  Yes [name], MD  letrozole (FEMARA) 2.5 MG tablet TAKE 1 TABLET BY MOUTH ONCE DAILY 03/20/17  Yes [name], MD  Lidocaine-Prilocaine, Bulk, 2.5-2.5 % CREA Apply 5 mLs topically as needed. 2 hours before use, cover with plawtic wrap. 05/16/17  Yes [name], MD  omeprazole (PRILOSEC) 20 MG capsule Take 1 capsule (20 mg total) by mouth daily. Take while using dexamethasone to prevent heartburn. 01/09/16  Yes [name], MD  oxyCODONE-acetaminophen (PERCOCET/ROXICET) 5-325 MG tablet Take 1 tablet by  mouth every 6 (six) hours as needed for severe pain. 01/23/16  Yes [name], MD  polyethylene glycol [facility] / Floria Raveling) packet Take 17 g by mouth daily. 01/05/16  Yes Charlynne Cousins, MD  Ribociclib Succinate 200 MG TABS Take 600 mg by mouth daily. 3 weeks on 1 week off 04/20/16  Yes [name], MD  valsartan (DIOVAN) 80 MG tablet Take 80 mg by mouth daily.   Yes [provider]  atenolol (TENORMIN) 50 MG tablet Take 50 mg by mouth daily.    [provider]  diazepam (VALIUM) 5 MG tablet Take 1 tablet (5 mg total) by mouth 2 (two) times daily. Patient not taking: Reported on 03/14/2016 10/06/15   Nona Dell, PA-C  Magnesium Salicylate (DOANS PILLS PO) Take 2 tablets by mouth daily as needed (for pain).    [provider]  metFORMIN (GLUCOPHAGE) 500 MG tablet Take 1 tablet (500 mg total) by mouth 2 (two) times daily with a meal. Patient not taking: Reported on 05/30/2016 03/02/16   [name], MD  methocarbamol (ROBAXIN) 500 MG tablet Take 1 tablet (500 mg total) by mouth 2 (two) times daily as needed for muscle spasms. Patient taking differently: Take 1,000 mg by mouth 4 (four) times daily as needed for muscle spasms.  11/03/15   Ward, Ozella Almond, PA-C  traMADol (ULTRAM) 50 MG tablet Take 50-100 mg by mouth every 6 (six) hours as needed (for pain).    [provider]     Family History  Problem Relation Age of Onset  . Heart attack Father 90  . Lupus Maternal Aunt   . Prostate cancer Maternal Grandfather 30  . Dementia Paternal Grandmother   . Leukemia Cousin 38       paternal first cousin    Social History   Socioeconomic History  . Marital status: Single    Spouse name: None  . Number of children: None  . Years of education: None  . Highest education level: None  Social Needs  . Financial resource strain: None  . Food insecurity - worry: None  . Food insecurity - inability: None  . Transportation needs - medical: None  . Transportation needs - non-medical: None  Occupational History  . None  Tobacco Use  . Smoking status: Never Smoker  . Smokeless tobacco: Never Used  Substance and Sexual Activity  . Alcohol use: Yes    Comment: Rarely- maybe  every 6 months  . Drug use: No  . Sexual activity: No  Other Topics Concern  . None  Social History Narrative  . None      Review of  Systems denies fever, headache, chest pain, dyspnea, cough, vomiting.  She does have intermittent nausea, some right upper quadrant discomfort, back pain and occasional blood in stool.  Vital Signs: BP 135/82 (BP Location: Right Arm)   Pulse 85   Temp 98.6 F (37 C) (Oral)   Resp 16   SpO2 100%   Physical Exam awake, alert.  Chest clear to auscultation bilaterally.  Clean, intact left chest wall Port-A-Cath.  Heart with regular rate and rhythm.  Abdomen soft, positive bowel sounds, currently nontender.  No lower extremity edema.  Imaging: Ct Chest W Contrast  Result Date: 06/06/2017 CLINICAL DATA:  Metastatic breast cancer with recurrence in 2017. Oral chemotherapy ongoing, prior radiation therapy. Right hip and back pain. Blood in the stool 2 weeks ago. EXAM: CT CHEST, ABDOMEN, AND PELVIS WITH CONTRAST TECHNIQUE: Multidetector CT imaging  of the chest, abdomen and pelvis was performed following the standard protocol during bolus administration of intravenous contrast. CONTRAST:  182m ISOVUE-300 IOPAMIDOL (ISOVUE-300) INJECTION 61%, 344mISOVUE-300 IOPAMIDOL (ISOVUE-300) INJECTION 61% COMPARISON:  Multiple exams, including 11/07/2016 FINDINGS: CT CHEST FINDINGS Cardiovascular: Left Port-A-Cath tip: SVC. Mediastinum/Nodes: No pathologic adenopathy. Right axillary clips noted. Lungs/Pleura: Interstitial accentuation anteriorly in the right lung compatible with radiation port. No worrisome pulmonary nodules are identified. Musculoskeletal: Oval-shaped 3.5 by 3.0 cm complex lesion in the right upper medial breast with marginal clips, probably a complex postoperative fluid collection. This is similar in size compared to the prior exam. CT ABDOMEN PELVIS FINDINGS Hepatobiliary: There are over 20 new masses scattered in the liver, with an index lesion in the dome of the liver measuring 1.6 by 1.5 cm on image 40/2. Pancreas: Unremarkable Spleen: Unremarkable Adrenals/Urinary Tract: Small left kidney upper  pole cyst. Otherwise unremarkable. Stomach/Bowel: Unremarkable Vascular/Lymphatic: A porta hepatis node measures 0.9 cm in short axis on image 58/2, previously 0.5 cm. Right external iliac node 1.0 cm in short axis on image 110/2, previously 0.4 cm. Right gastric node 0.8 cm in short axis on image 54/2, formerly not seen. Reproductive: Unremarkable Other: No supplemental non-categorized findings. Musculoskeletal: Widespread mixed lytic and sclerotic metastatic disease throughout the skeleton, distribution similar to previous exam. Chronic compression fracture at L4 with stable bony retropulsion. IMPRESSION: 1. There are over 20 masses scattered throughout the liver which are new compared to prior exam and most compatible with new metastatic lesions. Several borderline enlarged lymph nodes including a porta hepatis node and a right external iliac node are increased in size compared to the prior exam. 2. Widespread mixed lytic and sclerotic metastatic disease throughout the skeleton has a similar distribution to the prior exam. 3. Chronic compression fracture at L4 with stable bony retropulsion. 4. Essentially stable appearance of the ovoid complex lesion in the right upper medial breast which is likely a complex postoperative fluid collection. Electronically Signed   By: WaVan Clines.D.   On: 06/06/2017 15:13   Nm Bone Scan Whole Body  Result Date: 06/06/2017 CLINICAL DATA:  Metastatic stage IV breast cancer, restaging, BILATERAL leg pain, denies trauma EXAM: NUCLEAR MEDICINE WHOLE BODY BONE SCAN TECHNIQUE: Whole body anterior and posterior images were obtained approximately 3 hours after intravenous injection of radiopharmaceutical. RADIOPHARMACEUTICALS:  21.8 mCi Technetium-9968mP IV COMPARISON:  None new line radiographic correlation: CT chest abdomen pelvis 06/06/2017 FINDINGS: Abnormal foci of increased tracer localization are seen at the vertex of the skull, LEFT mandible, BILATERAL anterior and  posterior LEFT ribs, RIGHT ischium, and within the lumbar spine. Pattern of uptake is most consistent with osseous metastatic disease. Additional scattered degenerative type uptake at the shoulders and LEFT wrist. CT exam demonstrates extensive metastatic lesions in spine, pelvis, ribs, sternum, and proximal femora. Uptake in lumbar spine corresponds to a pathologic compression fracture of L4 vertebral body. Expected urinary tract and soft tissue distribution of tracer. IMPRESSION: Multiple sites of abnormal osseous tracer uptake consistent with osseous metastatic disease. Number of lesions by scintigraphy is substantially lower than number of lesions identified by CT. Pathologic compression fracture of L4 vertebral body. Electronically Signed   By: MarLavonia DanaD.   On: 06/06/2017 15:59   Ct Abdomen Pelvis W Contrast  Result Date: 06/06/2017 CLINICAL DATA:  Metastatic breast cancer with recurrence in 2017. Oral chemotherapy ongoing, prior radiation therapy. Right hip and back pain. Blood in the stool 2 weeks ago. EXAM: CT CHEST, ABDOMEN, AND  PELVIS WITH CONTRAST TECHNIQUE: Multidetector CT imaging of the chest, abdomen and pelvis was performed following the standard protocol during bolus administration of intravenous contrast. CONTRAST:  170m ISOVUE-300 IOPAMIDOL (ISOVUE-300) INJECTION 61%, 323mISOVUE-300 IOPAMIDOL (ISOVUE-300) INJECTION 61% COMPARISON:  Multiple exams, including 11/07/2016 FINDINGS: CT CHEST FINDINGS Cardiovascular: Left Port-A-Cath tip: SVC. Mediastinum/Nodes: No pathologic adenopathy. Right axillary clips noted. Lungs/Pleura: Interstitial accentuation anteriorly in the right lung compatible with radiation port. No worrisome pulmonary nodules are identified. Musculoskeletal: Oval-shaped 3.5 by 3.0 cm complex lesion in the right upper medial breast with marginal clips, probably a complex postoperative fluid collection. This is similar in size compared to the prior exam. CT ABDOMEN PELVIS  FINDINGS Hepatobiliary: There are over 20 new masses scattered in the liver, with an index lesion in the dome of the liver measuring 1.6 by 1.5 cm on image 40/2. Pancreas: Unremarkable Spleen: Unremarkable Adrenals/Urinary Tract: Small left kidney upper pole cyst. Otherwise unremarkable. Stomach/Bowel: Unremarkable Vascular/Lymphatic: A porta hepatis node measures 0.9 cm in short axis on image 58/2, previously 0.5 cm. Right external iliac node 1.0 cm in short axis on image 110/2, previously 0.4 cm. Right gastric node 0.8 cm in short axis on image 54/2, formerly not seen. Reproductive: Unremarkable Other: No supplemental non-categorized findings. Musculoskeletal: Widespread mixed lytic and sclerotic metastatic disease throughout the skeleton, distribution similar to previous exam. Chronic compression fracture at L4 with stable bony retropulsion. IMPRESSION: 1. There are over 20 masses scattered throughout the liver which are new compared to prior exam and most compatible with new metastatic lesions. Several borderline enlarged lymph nodes including a porta hepatis node and a right external iliac node are increased in size compared to the prior exam. 2. Widespread mixed lytic and sclerotic metastatic disease throughout the skeleton has a similar distribution to the prior exam. 3. Chronic compression fracture at L4 with stable bony retropulsion. 4. Essentially stable appearance of the ovoid complex lesion in the right upper medial breast which is likely a complex postoperative fluid collection. Electronically Signed   By: WaVan Clines.D.   On: 06/06/2017 15:13    Labs:  CBC: Recent Labs    01/07/17 1029 02/11/17 1613 04/09/17 0950 05/07/17 0938  WBC 2.1* 2.4* 2.3* 2.4*  HGB 10.1* 10.2* 9.4* 9.2*  HCT 31.9* 31.2* 29.1* 29.5*  PLT 200 296 291 312    COAGS: No results for input(s): INR, APTT in the last 8760 hours.  BMP: Recent Labs    01/07/17 1029 02/11/17 1613 04/09/17 0950  05/07/17 0938  NA 141 142 139 139  K 3.5 3.3* 3.3* 3.8  CL  --   --   --  105  CO2 _0 GLUCOSE 170* 127 213* 198*  BUN 8.4 10.6 14.9 10  CALCIUM 9.3 9.8 9.0 8.7  CREATININE 0.8 0.8 0.9 0.95  GFRNONAA  --   --   --  >60  GFRAA  --   --   --  >60    LIVER FUNCTION TESTS: Recent Labs    01/07/17 1029 02/11/17 1613 04/09/17 0950 05/07/17 0938  BILITOT 0.33 0.51 0.37 0.4  AST _1 ALT _2 ALKPHOS 46 54 63 70  PROT 7.1 7.6 7.1 6.9  ALBUMIN 3.8 4.0 3.6 3.5    TUMOR MARKERS: No results for input(s): AFPTM, CEA, CA199, CHROMGRNA in the last 8760 hours.  Assessment and Plan: [age].o. female with history of right breast cancer, initially diagnosed in 2015, status  post surgery, chemoradiation and antiestrogen therapy.  She now presents with disease progression and request received from oncology for image guided liver lesion biopsy for further evaluation.Risks and benefits discussed with the patient including, but not limited to bleeding, infection, damage to adjacent structures or low yield requiring additional tests.  All of the patient's questions were answered, patient is agreeable to proceed. Consent signed and in chart.  Labs pending.   Thank you for this interesting consult.  I greatly enjoyed meeting WHITNI PASQUINI and look forward to participating in their care.  A copy of this report was sent to the requesting provider on this date.  Electronically Signed: D. Rowe Robert, PA-C 06/17/2017, 11:27 AM   I spent a total of 25 minutes  in face to face in clinical consultation, greater than 50% of which was counseling/coordinating care for image guided liver lesion biopsy

## 2017-06-17 NOTE — Discharge Instructions (Signed)
Moderate Conscious Sedation, Adult, Care After These instructions provide you with information about caring for yourself after your procedure. Your health care provider may also give you more specific instructions. Your treatment has been planned according to current medical practices, but problems sometimes occur. Call your health care provider if you have any problems or questions after your procedure. What can I expect after the procedure? After your procedure, it is common:  To feel sleepy for several hours.  To feel clumsy and have poor balance for several hours.  To have poor judgment for several hours.  To vomit if you eat too soon.  Follow these instructions at home: For at least 24 hours after the procedure:   Do not: ? Participate in activities where you could fall or become injured. ? Drive. ? Use heavy machinery. ? Drink alcohol. ? Take sleeping pills or medicines that cause drowsiness. ? Make important decisions or sign legal documents. ? Take care of children on your own.  Rest. Eating and drinking  Follow the diet recommended by your health care provider.  If you vomit: ? Drink water, juice, or soup when you can drink without vomiting. ? Make sure you have little or no nausea before eating solid foods. General instructions  Have a responsible adult stay with you until you are awake and alert.  Take over-the-counter and prescription medicines only as told by your health care provider.  If you smoke, do not smoke without supervision.  Keep all follow-up visits as told by your health care provider. This is important. Contact a health care provider if:  You keep feeling nauseous or you keep vomiting.  You feel light-headed.  You develop a rash.  You have a fever. Get help right away if:  You have trouble breathing. This information is not intended to replace advice given to you by your health care provider. Make sure you discuss any questions you have  with your health care provider. Document Released: 01/28/2013 Document Revised: 09/12/2015 Document Reviewed: 07/30/2015 Elsevier Interactive Patient Education  2018 [employer]. Liver Biopsy The liver is a large organ in the upper right-hand side of your abdomen. A liver biopsy is a procedure in which a tissue sample is taken from the liver and examined under a microscope. The procedure is done to confirm a suspected problem. There are three types of liver biopsies:  Percutaneous. In this type, an incision is made in your abdomen. The sample is removed through the incision with a needle.  Laparoscopic. In this type, several incisions are made in the abdomen. A tiny camera is passed through one of the incisions to help guide the health care provider. The sample is removed through the other incision or incisions.  Transjugular. In this type, an incision is made in the neck. A tube is passed through the incision to the liver. The sample is removed through the tube with a needle.  Tell a health care provider about:  Any allergies you have.  All medicines you are taking, including vitamins, herbs, eye drops, creams, and over-the-counter medicines.  Any problems you or family members have had with anesthetic medicines.  Any blood disorders you have.  Any surgeries you have had.  Any medical conditions you have.  Possibility of pregnancy, if this applies. What are the risks? Generally, this is a safe procedure. However, problems can occur and include:  Bleeding.  Infection.  Bruising.  Collapsed lung.  Leak of digestive juices (bile) from the liver or gallbladder.  Problems with heart rhythm.  Pain at the biopsy site or in the right shoulder.  Low blood pressure (hypotension).  Injury to nearby organs or tissues.  What happens before the procedure?  Your health care provider may do some blood or urine tests. These will help your health care provider learn how well your  kidneys and liver are working and how well your blood clots.  Ask your health care provider if you will be able to go home the day of the procedure. Arrange for someone to take you home and stay with you for at least 24 hours.  Do not eat or drink anything after midnight on the night before the procedure or as directed by your health care provider.  Ask your health care provider about: ? Changing or stopping your regular medicines. This is especially important if you are taking diabetes medicines or blood thinners. ? Taking medicines such as aspirin and ibuprofen. These medicines can thin your blood. Do not take these medicines before your procedure if your health care provider asks you not to. What happens during the procedure? Regardless of the type of biopsy that will be done, you will have an IV line placed. Through this line, you will receive fluids and medicine to relax you. If you will be having a laparoscopic biopsy, you may also receive medicine through this line to make you sleep during the procedure (general anesthetic). Percutaneous Liver Biopsy  You will positioned on your back, with your right hand over your head.  A health care provider will locate your liver by tapping and pressing on the right side of your abdomen or with the help of an ultrasound machine or CT scan.  An area at the bottom of your last right rib will be numbed.  An incision will be made in the numbed area.  The biopsy needle will be inserted into the incision.  Several samples of liver tissue will be taken with the biopsy needle. You will be asked to hold your breath as each sample is taken. Laparoscopic Liver Biopsy  You will be positioned on your back.  Several small incisions will be made in your abdomen.  Your doctor will pass a tiny camera through one incision. The camera will allow the liver to be viewed on a TV monitor in the operating room.  Tools will be passed through the other incision or  incisions. These tools will be used to remove samples of liver tissue. Transjugular Liver Biopsy  You will be positioned on your back on an X-ray table, with your head turned to your left.  An area on your neck just over your jugular vein will be numbed.  An incision will be made in the numbed area.  A tiny tube will be inserted through the incision. It will be pushed through the jugular vein to a blood vessel in the liver called the hepatic vein.  Dye will be inserted through the tube, and X-rays will be taken. The dye will make the blood vessels in the liver light up on the X-rays.  The biopsy needle will be pushed through the tube until it reaches the liver.  Samples of liver tissue will be taken with the biopsy needle.  The needle and the tube will be removed. After the samples are obtained, the incision or incisions will be closed. What happens after the procedure?  You will be taken to a recovery area.  You may have to lie on your right side for 1-2  hours. This will prevent bleeding from the biopsy site.  Your progress will be watched. Your blood pressure, pulse, and the biopsy site will be checked often.  You may have some pain or feel sick. If this happens, tell your health care provider.  As you begin to feel better, you will be offered ice and beverages.  You may be allowed to go home when the medicines have worn off and you can walk, drink, eat, and use the bathroom. This information is not intended to replace advice given to you by your health care provider. Make sure you discuss any questions you have with your health care provider. Document Released: 06/30/2003 Document Revised: 09/12/2015 Document Reviewed: 06/05/2013 Elsevier Interactive Patient Education  2018 [employer].   Liver Biopsy, Care After These instructions give you information on caring for yourself after your procedure. Your doctor may also give you more specific instructions. Call your doctor if  you have any problems or questions after your procedure. Follow these instructions at home:  Rest at home for 1-2 days or as told by your doctor.  Have someone stay with you for at least 24 hours.  Do not do these things in the first 24 hours: ? Drive. ? Use machinery. ? Take care of other people. ? Sign legal documents. ? Take a bath or shower.  There are many different ways to close and cover a cut (incision). For example, a cut can be closed with stitches, skin glue, or adhesive strips. Follow your doctor's instructions on: ? Taking care of your cut. ? Changing and removing your bandage (dressing). ? Removing whatever was used to close your cut.  Do not drink alcohol in the first week.  Do not lift more than 5 pounds or play contact sports for the first 2 weeks.  Take medicines only as told by your doctor. For 1 week, do not take medicine that has aspirin in it or medicines like ibuprofen.  Get your test results. Contact a doctor if:  A cut bleeds and leaves more than just a small spot of blood.  A cut is red, puffs up (swells), or hurts more than before.  Fluid or something else comes from a cut.  A cut smells bad.  You have a fever or chills. Get help right away if:  You have swelling, bloating, or pain in your belly (abdomen).  You get dizzy or faint.  You have a rash.  You feel sick to your stomach (nauseous) or throw up (vomit).  You have trouble breathing, feel short of breath, or feel faint.  Your chest hurts.  You have problems talking or seeing.  You have trouble balancing or moving your arms or legs. This information is not intended to replace advice given to you by your health care provider. Make sure you discuss any questions you have with your health care provider. Document Released: 01/17/2008 Document Revised: 09/15/2015 Document Reviewed: 06/05/2013 Elsevier Interactive Patient Education  [employer].

## 2017-06-17 NOTE — Procedures (Signed)
Pre Procedure Dx: History of breast cancer, now with multiple liver lesions worrisome for metastatic disease. Post Procedural Dx: Same  Technically successful US guided biopsy of indeterminate liver lesion.  EBL: None  No immediate complications.   Ronny Bacon, MD Pager #: [phone]

## 2017-06-19 MED FILL — KISQALI 200 MG DAILY DOSE: 200 | 28 days supply | Qty: 63 | Fill #2

## 2017-06-25 ENCOUNTER — Inpatient Hospital Stay: Payer: Medicare Other | Attending: Hematology and Oncology | Admitting: Hematology and Oncology

## 2017-06-25 ENCOUNTER — Encounter: Payer: Self-pay | Admitting: *Deleted

## 2017-06-25 ENCOUNTER — Telehealth: Payer: Self-pay | Admitting: Hematology and Oncology

## 2017-06-25 DIAGNOSIS — C50211 Malignant neoplasm of upper-inner quadrant of right female breast: Secondary | ICD-10-CM | POA: Diagnosis not present

## 2017-06-25 DIAGNOSIS — C787 Secondary malignant neoplasm of liver and intrahepatic bile duct: Secondary | ICD-10-CM | POA: Diagnosis not present

## 2017-06-25 DIAGNOSIS — R05 Cough: Secondary | ICD-10-CM | POA: Diagnosis not present

## 2017-06-25 DIAGNOSIS — Z170 Estrogen receptor positive status [ER+]: Secondary | ICD-10-CM | POA: Insufficient documentation

## 2017-06-25 DIAGNOSIS — C773 Secondary and unspecified malignant neoplasm of axilla and upper limb lymph nodes: Secondary | ICD-10-CM | POA: Insufficient documentation

## 2017-06-25 DIAGNOSIS — C7951 Secondary malignant neoplasm of bone: Secondary | ICD-10-CM | POA: Diagnosis not present

## 2017-06-25 DIAGNOSIS — R6889 Other general symptoms and signs: Secondary | ICD-10-CM | POA: Diagnosis not present

## 2017-06-25 DIAGNOSIS — Z5111 Encounter for antineoplastic chemotherapy: Secondary | ICD-10-CM | POA: Insufficient documentation

## 2017-06-25 MED ORDER — AMOXICILLIN-POT CLAVULANATE 875-125 MG PO TABS: 1.0000 | ORAL_TABLET | Freq: Two times a day (BID) | ORAL | 0 refills | Status: DC

## 2017-06-25 MED FILL — AMOX TR-K CLV 875-125 MG TA: 875-125 | 7 days supply | Qty: 14 | Fill #0

## 2017-06-25 NOTE — Assessment & Plan Note (Signed)
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   Treatment summary:  1. Patient completed neoadjuvant dose dense Adriamycin and Cytoxan and weekly Taxol X 12 started 02/09/2014 and completed 07/01/14 2. Right double lumpectomy 2:00: IDC grade 3, 3.2 cm, high-grade DCIS, LV I present, PNI present,; 12:00: IDC grade 3; 0.8 cm, high-grade DCIS, LVI, 4/4 lymph nodes positive with ECE, ER 90%, PR 40%, HER-2 negative margin positive T2 N2 M0 stage IIIa 3. Patient is on [facility] clinical trial and she was randomized intraoperatively for NO lymph node dissection. 4.Tamoxifen 20 mg daily started 12/29/2014 stopped July 2016 for adverse effects 5. 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 Palliative XRT to the spine for cord compression 8. Ribociclibwith letrozole plus ovarian suppression started 01/13/2016-06/25/2017  ---------------------------------------------------------------------------------------------------------------------------------------------------------  CT chest abdomen pelvis: There are over 20 masses scattered throughout the liver   Liver biopsy 06/17/2017  Metastatic carcinoma: positive for cytokeratin 7, GCDFP, and GATA-3 but negative for cytokeratin 20. The immunoprofile is consistent with metastasis from the patient's known breast carcinoma.  ER 0%, PR 0%, Ki-67 20%, HER-2 negative ratio 1.23  Current treatment plan: Halaven days 1 and 8 every 3 weeks Bone Mets: Xgeva with calcium and vitamin D Chemo counseling: Given the fact that she is ER PR negative, we have to use systemic chemotherapy.  I discussed the risks and benefits of Halaven and patient is consented to proceed with the treatment.

## 2017-06-25 NOTE — Progress Notes (Signed)
Patient Care Team: Lucianne Lei, MD as PCP - General (Family Medicine) [name], MD as Consulting Physician (General Surgery) [name], MD as Consulting Physician (Hematology and Oncology) [name], MD as Attending Physician (Radiation Oncology) Benson Norway, RN as Registered Nurse (Oncology) Sylvan Cheese, NP as Nurse Practitioner (Hematology and Oncology)  DIAGNOSIS:  Encounter Diagnosis  Name Primary?  . Malignant neoplasm of upper-inner quadrant of right breast in female, estrogen receptor positive ([place])     SUMMARY OF ONCOLOGIC HISTORY:   Breast cancer of upper-inner quadrant of right female breast ([place])   01/12/2014 Mammogram    Right breast - two lesions: #1 2:00 position 1.6 x 1.9 cm and #2 12:00 position 0.5 x 0.9 cm. Distance between the 2 was 4.5 cm, right axillary lymph node enlargement      01/12/2014 Initial Biopsy    Right breast 3 biopsies: All were IDC with DCIS ER+ PR + Ki-67 85% HER-2 negative: Right axillary lymph node also positive for metastatic cancer ER positive HER-2 negative Ki-67 80% grade 2      01/18/2014 Breast MRI    Right breast 12:00 position 2.8 x 2 x 2.2 cm: 2:00 position 1.3 x 0.8 x 0.5 cm, right axilla multiple enlarged lymph nodes 1 cm in size, right retropectoral lymph node 0.6 cm      01/18/2014 Clinical Stage    Stage IIB T2 N1      01/25/2014 Procedure    Breast/Ovarian ([facility]) reveals no clinically significant variant at ATM, BARD1, BRCA1, BRCA2, BRIP1, CDH1, CHEK2, EPCAM, FANCC, MLH1, MSH2, MSH6, NBN, PALB2, PMS2, PTEN, RAD51C, RAD51D, TP53, and XRCC2.       02/11/2014 -  Neo-Adjuvant Chemotherapy    Doxorubicin and cyclophosphamide X 4 followed by Taxol weekly x12       07/12/2014 Breast MRI    Partial response to neoadjuvant chemotherapy right breast mass decreased from 2.8 cm to 2.3 cm, 2 small masses along the medial margin also decreased; significant decrease right breast mass 11 to 12:00 and 13 mm  to 8 mm, decrease in right axilla LN      08/23/2014 Definitive Surgery    Right lumpectomy/SLNB [name]) 2:00: IDC grade 3, 3.2 cm, high-grade DCIS, LV I present, PNI present,; 12:00: IDC grade 30.8 cm, high-grade DCIS, LV I, 4/4 lymph nodes positive with ECE, ER 90%, PR 40%, HER-2 negative margin positive      08/23/2014 Pathologic Stage    Stage IIIA: ypT2 ypN2a      09/15/2014 Surgery    Reexcision of margins: clear; no evidence of malignancy      11/03/2014 - 12/14/2014 Radiation Therapy    Adjuvant XRT Isidore Moos): Right Breast, supraclavicular nodes, axillary nodes, and internal mammary nodes: 50 Gy over 25 fractions; right breast boost: 10 Gy over 5 fractions. Total dose: 60 Gy      12/29/2014 -  Anti-estrogen oral therapy    Tamoxifen 20 mg daily      04/08/2015 Survivorship    Survivorship care plan completed and mailed to patient in lieu of in person visit at her request      12/31/2015 Relapse/Recurrence    Innumerable osseous metastases with acute pathologic fracture the L4 vertebral body. 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 L5-S1 protrusion with left more than right S1 impingement      01/05/2016 - 01/12/2016 Radiation Therapy    Palliative radiation to the spine for cord compression      01/13/2016 -  Anti-estrogen oral therapy    Ribociclib 3 weeks on and 1 week off and Letrozole (with Zolodex until oophorectomy) and Xgeva for bone mets      06/17/2017 Pathology Results    Liver biopsy: Metastatic carcinoma: positive for cytokeratin 7, GCDFP, and GATA-3 but negative for cytokeratin 20. The immunoprofile is consistent with metastasis from the patient's known breast carcinoma.  ER 0%, PR 0%, Ki-67 20%, HER-2 negative ratio 1.23       CHIEF COMPLIANT: Follow-up of recently performed liver biopsy  INTERVAL HISTORY: Debbie Martinez is a 42 year old with above-mentioned history metastatic breast cancer.  She was hormone receptor positive  previously.  She had been on Ribociclib along with letrozole.  She started developing new evidence of liver metastases.  Because of this she underwent a liver biopsy and she is here today to discuss the pathology report.  Final pathology revealed that the tumor is triple negative.  She is complaining of left leg pain for which she is taking narcotic pain medications.  She is also having upper respiratory infection symptoms.  REVIEW OF SYSTEMS:   Constitutional: Denies fevers, chills or abnormal weight loss Eyes: Denies blurriness of vision Ears, nose, mouth, throat, and face: Denies mucositis or sore throat Respiratory: Complains of cough and congestion Cardiovascular: Denies palpitation, chest discomfort Gastrointestinal:  Denies nausea, heartburn or change in bowel habits Skin: Denies abnormal skin rashes Lymphatics: Denies new lymphadenopathy or easy bruising Neurological:Denies numbness, tingling or new weaknesses Behavioral/Psych: Mood is stable, no new changes  Extremities: No lower extremity edema  All other systems were reviewed with the patient and are negative.  I have reviewed the past medical history, past surgical history, social history and family history with the patient and they are unchanged from previous note.  ALLERGIES:  is allergic to lisinopril and tamoxifen.  MEDICATIONS:  Current Outpatient Medications  Medication Sig Dispense Refill  . ACCU-CHEK AVIVA PLUS test strip USE AS DIRECTED 4 TIMES A DAY 1 each 12  . ACCU-CHEK SOFTCLIX LANCETS lancets 100 each by Other route 4 (four) times daily. Use as instructed 100 each 0  . atenolol (TENORMIN) 50 MG tablet Take 50 mg by mouth daily.    . blood glucose meter kit and supplies KIT Dispense based on patient and insurance preference. Use up to four times daily as directed. (FOR ICD-9 250.00, 250.01). 1 each 0  . Cholecalciferol (VITAMIN D-3 PO) Take 1 capsule by mouth daily.    . diazepam (VALIUM) 5 MG tablet Take 1  tablet (5 mg total) by mouth 2 (two) times daily. (Patient not taking: Reported on 03/14/2016) 10 tablet 0  . ENSURE (ENSURE) Take 237 mLs by mouth 2 (two) times daily between meals.     . gabapentin (NEURONTIN) 300 MG capsule Take 1 capsule (300 mg total) by mouth 3 (three) times daily. 90 capsule 5  . Iron-Vitamins (GERITOL) LIQD Take 5 mLs by mouth daily.    Marland Kitchen KISQALI 200 DOSE 200 MG tablet TAKE 3 TABLETS BY MOUTH DAILY. 3 WEEKS ON AND 1 WEEK OFF 63 tablet 6  . letrozole (FEMARA) 2.5 MG tablet TAKE 1 TABLET BY MOUTH ONCE DAILY 90 tablet 3  . Lidocaine-Prilocaine, Bulk, 2.5-2.5 % CREA Apply 5 mLs topically as needed. 2 hours before use, cover with plawtic wrap. 30 g 2  . Magnesium Salicylate (DOANS PILLS PO) Take 2 tablets by mouth daily as needed (for pain).    . metFORMIN (GLUCOPHAGE) 500 MG tablet Take 1 tablet (500 mg  total) by mouth 2 (two) times daily with a meal. (Patient not taking: Reported on 05/30/2016) 60 tablet 0  . methocarbamol (ROBAXIN) 500 MG tablet Take 1 tablet (500 mg total) by mouth 2 (two) times daily as needed for muscle spasms. (Patient taking differently: Take 1,000 mg by mouth 4 (four) times daily as needed for muscle spasms. ) 20 tablet 0  . omeprazole (PRILOSEC) 20 MG capsule Take 1 capsule (20 mg total) by mouth daily. Take while using dexamethasone to prevent heartburn. 30 capsule 2  . oxyCODONE-acetaminophen (PERCOCET/ROXICET) 5-325 MG tablet Take 1 tablet by mouth every 6 (six) hours as needed for severe pain. 120 tablet 0  . polyethylene glycol (MIRALAX / GLYCOLAX) packet Take 17 g by mouth daily. 14 each 0  . Ribociclib Succinate 200 MG TABS Take 600 mg by mouth daily. 3 weeks on 1 week off 63 tablet 6  . traMADol (ULTRAM) 50 MG tablet Take 50-100 mg by mouth every 6 (six) hours as needed (for pain).    . valsartan (DIOVAN) 80 MG tablet Take 80 mg by mouth daily.     No current facility-administered medications for this visit.     PHYSICAL EXAMINATION: ECOG  PERFORMANCE STATUS: 1 - Symptomatic but completely ambulatory  Vitals:   06/25/17 0938  BP: (!) 141/75  Pulse: (!) 136  Resp: 18  Temp: 99.1 F (37.3 C)  SpO2: 100%   Filed Weights   06/25/17 0938  Weight: 175 lb 3.2 oz (79.5 kg)    GENERAL:alert, no distress and comfortable SKIN: skin color, texture, turgor are normal, no rashes or significant lesions EYES: normal, Conjunctiva are pink and non-injected, sclera clear OROPHARYNX:no exudate, no erythema and lips, buccal mucosa, and tongue normal  NECK: supple, thyroid normal size, non-tender, without nodularity LYMPH:  no palpable lymphadenopathy in the cervical, axillary or inguinal LUNGS: clear to auscultation and percussion with normal breathing effort HEART: regular rate & rhythm and no murmurs and no lower extremity edema ABDOMEN:abdomen soft, non-tender and normal bowel sounds MUSCULOSKELETAL:no cyanosis of digits and no clubbing  NEURO: alert & oriented x 3 with fluent speech, no focal motor/sensory deficits EXTREMITIES: No lower extremity edema  LABORATORY DATA:  I have reviewed the data as listed CMP Latest Ref Rng & Units 06/17/2017 05/07/2017 04/09/2017  Glucose 65 - 99 mg/dL 144(H) 198(H) 213(H)  BUN 6 - 20 mg/dL 12 10 14.9  Creatinine 0.44 - 1.00 mg/dL 0.81 0.95 0.9  Sodium 135 - 145 mmol/L 141 139 139  Potassium 3.5 - 5.1 mmol/L 3.0(L) 3.8 3.3(L)  Chloride 101 - 111 mmol/L 105 105 -  CO2 22 - 32 mmol/L _0 Calcium 8.9 - 10.3 mg/dL 8.6(L) 8.7 9.0  Total Protein 6.5 - 8.1 g/dL 7.6 6.9 7.1  Total Bilirubin 0.3 - 1.2 mg/dL 0.7 0.4 0.37  Alkaline Phos 38 - 126 U/L 75 70 63  AST 15 - 41 U/L _1 ALT 14 - 54 U/L _2 Lab Results  Component Value Date   WBC 2.4 (L) 06/17/2017   HGB 9.6 (L) 06/17/2017   HCT 30.0 (L) 06/17/2017   MCV 95.5 06/17/2017   PLT 253 06/17/2017   NEUTROABS 1.7 06/17/2017    ASSESSMENT & PLAN:  Breast cancer of upper-inner quadrant of right female breast ([place]) Right  breast invasive ductal carcinoma ER/PR positive HER-2 negative multifocal disease T2 N1 stage IIB clinical stage grade 2 with biopsy-proven axillary lymph node metastases.  Treatment summary:  1. Patient completed neoadjuvant dose dense Adriamycin and Cytoxan and weekly Taxol X 12 started 02/09/2014 and completed 07/01/14 2. Right double lumpectomy 2:00: IDC grade 3, 3.2 cm, high-grade DCIS, LV I present, PNI present,; 12:00: IDC grade 3; 0.8 cm, high-grade DCIS, LVI, 4/4 lymph nodes positive with ECE, ER 90%, PR 40%, HER-2 negative margin positive T2 N2 M0 stage IIIa 3. Patient is on [facility] clinical trial and she was randomized intraoperatively for NO lymph node dissection. 4.Tamoxifen 20 mg daily started 12/29/2014 stopped July 2016 for adverse effects 5. 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 Palliative XRT to the spine for cord compression 8. Ribociclibwith letrozole plus ovarian suppression started 01/13/2016-06/25/2017  ---------------------------------------------------------------------------------------------------------------------------------------------------------  CT chest abdomen pelvis: There are over 20 masses scattered throughout the liver   Liver biopsy 06/17/2017  Metastatic carcinoma: positive for cytokeratin 7, GCDFP, and GATA-3 but negative for cytokeratin 20. The immunoprofile is consistent with metastasis from the patient's known breast carcinoma.  ER 0%, PR 0%, Ki-67 20%, HER-2 negative ratio 1.23  Current treatment plan: Halaven days 1 and 8 every 3 weeks Bone Mets: Xgeva with calcium and vitamin D Chemo counseling: Given the fact that she is ER PR negative, we have to use systemic chemotherapy.  I discussed the risks and benefits of Halaven and patient is consented to proceed with the treatment.  Cough and congestion: I sent a prescription for  Augmentin because of suspect upper respiratory tract infection and bronchitis.  She is also slightly tachycardic and has had low-grade temperatures at home.  I spent 25 minutes talking to the patient of which more than half was spent in counseling and coordination of care.  No orders of the defined types were placed in this encounter.  The patient has a good understanding of the overall plan. she agrees with it. she will call with any problems that may develop before the next visit here.   Harriette Ohara, MD 06/25/17

## 2017-06-25 NOTE — Telephone Encounter (Signed)
Gave patient AVs and calendar of upcoming march and April appointments.  °

## 2017-07-01 ENCOUNTER — Other Ambulatory Visit: Payer: Self-pay | Admitting: Hematology and Oncology

## 2017-07-01 DIAGNOSIS — C50919 Malignant neoplasm of unspecified site of unspecified female breast: Secondary | ICD-10-CM

## 2017-07-01 MED ORDER — LIDOCAINE-PRILOCAINE 2.5-2.5 % EX CREA: TOPICAL_CREAM | CUTANEOUS | 3 refills | Status: DC

## 2017-07-01 MED ORDER — PROCHLORPERAZINE MALEATE 10 MG PO TABS: 10.0000 mg | ORAL_TABLET | Freq: Four times a day (QID) | ORAL | 1 refills | Status: DC | PRN

## 2017-07-01 MED ORDER — ONDANSETRON HCL 8 MG PO TABS: 8.0000 mg | ORAL_TABLET | Freq: Two times a day (BID) | ORAL | 1 refills | Status: DC | PRN

## 2017-07-01 MED FILL — ONDANSETRON HCL 8 MG TABS: 8 | 15 days supply | Qty: 30 | Fill #0

## 2017-07-01 MED FILL — LIDOCAINE-PRILOCAINE CREAM: 2.5-2.5 | 10 days supply | Qty: 30 | Fill #0

## 2017-07-01 MED FILL — PROCHLORPERAZINE 10 MG TAB: 10 | 7 days supply | Qty: 30 | Fill #0

## 2017-07-01 NOTE — Progress Notes (Signed)
START ON PATHWAY REGIMEN - Breast     A cycle is every 21 days:     Eribulin mesylate   **Always confirm dose/schedule in your pharmacy ordering system**    Patient Characteristics: Distant Metastases or Locoregional Recurrent Disease - Unresected, HER2 Negative/Unknown/Equivocal, ER Negative/Unknown, Chemotherapy, Third Line, Prior Anthracycline or Anthracycline Contraindicated and Prior Taxane Therapeutic Status: Distant Metastases BRCA Mutation Status: Absent ER Status: Negative (-) HER2 Status: Negative (-) Would you be surprised if this patient died  in the next year<= I would NOT be surprised if this patient died in the next year PR Status: Negative (-) Line of therapy: Third Line Intent of Therapy: Non-Curative / Palliative Intent, Discussed with Patient

## 2017-07-02 ENCOUNTER — Inpatient Hospital Stay: Payer: Medicare Other

## 2017-07-02 ENCOUNTER — Inpatient Hospital Stay (HOSPITAL_BASED_OUTPATIENT_CLINIC_OR_DEPARTMENT_OTHER): Payer: Medicare Other | Admitting: Hematology and Oncology

## 2017-07-02 ENCOUNTER — Encounter: Payer: Self-pay | Admitting: *Deleted

## 2017-07-02 VITALS — BP 128/88 | HR 95 | Temp 98.3°F | Resp 18 | Ht 67.0 in | Wt 173.1 lb

## 2017-07-02 DIAGNOSIS — Z95828 Presence of other vascular implants and grafts: Secondary | ICD-10-CM

## 2017-07-02 DIAGNOSIS — C50911 Malignant neoplasm of unspecified site of right female breast: Secondary | ICD-10-CM

## 2017-07-02 DIAGNOSIS — Z170 Estrogen receptor positive status [ER+]: Secondary | ICD-10-CM | POA: Diagnosis not present

## 2017-07-02 DIAGNOSIS — C50919 Malignant neoplasm of unspecified site of unspecified female breast: Secondary | ICD-10-CM

## 2017-07-02 DIAGNOSIS — C50211 Malignant neoplasm of upper-inner quadrant of right female breast: Secondary | ICD-10-CM

## 2017-07-02 DIAGNOSIS — C773 Secondary and unspecified malignant neoplasm of axilla and upper limb lymph nodes: Secondary | ICD-10-CM | POA: Diagnosis not present

## 2017-07-02 DIAGNOSIS — C787 Secondary malignant neoplasm of liver and intrahepatic bile duct: Secondary | ICD-10-CM

## 2017-07-02 DIAGNOSIS — R6889 Other general symptoms and signs: Secondary | ICD-10-CM | POA: Diagnosis not present

## 2017-07-02 DIAGNOSIS — C7951 Secondary malignant neoplasm of bone: Secondary | ICD-10-CM | POA: Diagnosis not present

## 2017-07-02 DIAGNOSIS — Z5111 Encounter for antineoplastic chemotherapy: Secondary | ICD-10-CM | POA: Diagnosis not present

## 2017-07-02 DIAGNOSIS — R05 Cough: Secondary | ICD-10-CM | POA: Diagnosis not present

## 2017-07-02 LAB — CMP (CANCER CENTER ONLY)
ALT: 30 U/L (ref 0–55)
ANION GAP: 8 (ref 3–11)
AST: 25 U/L (ref 5–34)
Albumin: 3.3 g/dL — ABNORMAL LOW (ref 3.5–5.0)
Alkaline Phosphatase: 85 U/L (ref 40–150)
BUN: 10 mg/dL (ref 7–26)
CHLORIDE: 105 mmol/L (ref 98–109)
CO2: 26 mmol/L (ref 22–29)
CREATININE: 0.71 mg/dL (ref 0.60–1.10)
Calcium: 9.8 mg/dL (ref 8.4–10.4)
Glucose, Bld: 147 mg/dL — ABNORMAL HIGH (ref 70–140)
POTASSIUM: 3.9 mmol/L (ref 3.5–5.1)
SODIUM: 139 mmol/L (ref 136–145)
TOTAL PROTEIN: 7.7 g/dL (ref 6.4–8.3)
Total Bilirubin: 0.3 mg/dL (ref 0.2–1.2)

## 2017-07-02 LAB — CBC WITH DIFFERENTIAL (CANCER CENTER ONLY)
BASOS PCT: 0 %
Basophils Absolute: 0 10*3/uL (ref 0.0–0.1)
EOS ABS: 0.1 10*3/uL (ref 0.0–0.5)
Eosinophils Relative: 1 %
HCT: 26.9 % — ABNORMAL LOW (ref 34.8–46.6)
Hemoglobin: 8.2 g/dL — ABNORMAL LOW (ref 11.6–15.9)
LYMPHS ABS: 0.7 10*3/uL — AB (ref 0.9–3.3)
Lymphocytes Relative: 16 %
MCH: 28.7 pg (ref 25.1–34.0)
MCHC: 30.5 g/dL — AB (ref 31.5–36.0)
MCV: 94.1 fL (ref 79.5–101.0)
MONOS PCT: 10 %
Monocytes Absolute: 0.4 10*3/uL (ref 0.1–0.9)
NEUTROS PCT: 73 %
NRBC: 1 /100{WBCs} — AB
Neutro Abs: 3.2 10*3/uL (ref 1.5–6.5)
PLATELETS: 381 10*3/uL (ref 145–400)
RBC: 2.86 MIL/uL — ABNORMAL LOW (ref 3.70–5.45)
RDW: 16.1 % — ABNORMAL HIGH (ref 11.2–14.5)
WBC Count: 4.4 10*3/uL (ref 3.9–10.3)

## 2017-07-02 MED ORDER — SODIUM CHLORIDE 0.9 % IV SOLN
Freq: Once | INTRAVENOUS | Status: AC
  Administered 2017-07-02: 11:00:00 via INTRAVENOUS

## 2017-07-02 MED ORDER — SODIUM CHLORIDE 0.9% FLUSH
10.0000 mL | Freq: Once | INTRAVENOUS | Status: AC
  Administered 2017-07-02: 10 mL
  Filled 2017-07-02: qty 10

## 2017-07-02 MED ORDER — VALSARTAN 80 MG PO TABS: 80.0000 mg | ORAL_TABLET | Freq: Every day | ORAL | 3 refills | Status: DC

## 2017-07-02 MED ORDER — SODIUM CHLORIDE 0.9% FLUSH
10.0000 mL | INTRAVENOUS | Status: DC | PRN
Start: 2017-07-02 — End: 2017-07-02
  Administered 2017-07-02: 10 mL
  Filled 2017-07-02: qty 10

## 2017-07-02 MED ORDER — DENOSUMAB 120 MG/1.7ML ~~LOC~~ SOLN
SUBCUTANEOUS | Status: AC
  Filled 2017-07-02: qty 1.7

## 2017-07-02 MED ORDER — PROCHLORPERAZINE MALEATE 10 MG PO TABS
ORAL_TABLET | ORAL | Status: AC
  Filled 2017-07-02: qty 1

## 2017-07-02 MED ORDER — PROCHLORPERAZINE MALEATE 10 MG PO TABS
10.0000 mg | ORAL_TABLET | Freq: Once | ORAL | Status: AC
  Administered 2017-07-02: 10 mg via ORAL

## 2017-07-02 MED ORDER — DENOSUMAB 120 MG/1.7ML ~~LOC~~ SOLN
120.0000 mg | Freq: Once | SUBCUTANEOUS | Status: AC
  Administered 2017-07-02: 120 mg via SUBCUTANEOUS

## 2017-07-02 MED ORDER — SODIUM CHLORIDE 0.9 % IV SOLN
1.4000 mg/m2 | Freq: Once | INTRAVENOUS | Status: AC
  Administered 2017-07-02: 2.7 mg via INTRAVENOUS
  Filled 2017-07-02: qty 5.4

## 2017-07-02 MED ORDER — HEPARIN SOD (PORK) LOCK FLUSH 100 UNIT/ML IV SOLN
500.0000 [IU] | Freq: Once | INTRAVENOUS | Status: AC | PRN
  Administered 2017-07-02: 500 [IU]
  Filled 2017-07-02: qty 5

## 2017-07-02 MED FILL — VALSARTAN 80 MG TABLET: 80 | 90 days supply | Qty: 90 | Fill #0

## 2017-07-02 NOTE — Assessment & Plan Note (Addendum)
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   Treatment summary:  1. Patient completed neoadjuvant dose dense Adriamycin and Cytoxan and weekly Taxol X 12 started 02/09/2014 and completed 07/01/14 2. Right double lumpectomy 2:00: IDC grade 3, 3.2 cm, high-grade DCIS, LV I present, PNI present,; 12:00: IDC grade 3; 0.8 cm, high-grade DCIS, LVI, 4/4 lymph nodes positive with ECE, ER 90%, PR 40%, HER-2 negative margin positive T2 N2 M0 stage IIIa 3. Patient is on [facility] clinical trial and she was randomized intraoperatively for NO lymph node dissection. 4.Tamoxifen 20 mg daily started 12/29/2014 stopped July 2016 for adverse effects 5. 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 Palliative XRT to the spine for cord compression 8. Ribociclibwith letrozole plus ovarian suppression started 01/13/2016-06/25/2017  ---------------------------------------------------------------------------------------------------------------------------------------------------------  CT chest abdomen pelvis: There are over 20 masses scattered throughout the liver   Liver biopsy 06/17/2017  Metastatic carcinoma: positive for cytokeratin 7, GCDFP, and GATA-3 but negative for cytokeratin 20. The immunoprofile is consistent with metastasis from the patient's known breast carcinoma.  ER 0%, PR 0%, Ki-67 20%, HER-2 negative ratio 1.23  Current treatment plan: Halaven days 1 and 8 every 3 weeks, today is cycle 1 day 8 Bone Mets: Xgeva with calcium and vitamin D Chemo toxicities:  Return to clinic in 2 weeks for cycle 2 of Halaven

## 2017-07-02 NOTE — Progress Notes (Signed)
Patient Care Team: Lucianne Lei, MD as PCP - General (Family Medicine) [name], MD as Consulting Physician (General Surgery) [name], MD as Consulting Physician (Hematology and Oncology) [name], MD as Attending Physician (Radiation Oncology) Benson Norway, RN as Registered Nurse (Oncology) Sylvan Cheese, NP as Nurse Practitioner (Hematology and Oncology)  DIAGNOSIS:  Encounter Diagnoses  Name Primary?  . Bone metastasis ([place]) Yes  . Malignant neoplasm of upper-inner quadrant of right breast in female, estrogen receptor positive ([place])     SUMMARY OF ONCOLOGIC HISTORY:   Breast cancer of upper-inner quadrant of right female breast ([place])   01/12/2014 Mammogram    Right breast - two lesions: #1 2:00 position 1.6 x 1.9 cm and #2 12:00 position 0.5 x 0.9 cm. Distance between the 2 was 4.5 cm, right axillary lymph node enlargement      01/12/2014 Initial Biopsy    Right breast 3 biopsies: All were IDC with DCIS ER+ PR + Ki-67 85% HER-2 negative: Right axillary lymph node also positive for metastatic cancer ER positive HER-2 negative Ki-67 80% grade 2      01/18/2014 Breast MRI    Right breast 12:00 position 2.8 x 2 x 2.2 cm: 2:00 position 1.3 x 0.8 x 0.5 cm, right axilla multiple enlarged lymph nodes 1 cm in size, right retropectoral lymph node 0.6 cm      01/18/2014 Clinical Stage    Stage IIB T2 N1      01/25/2014 Procedure    Breast/Ovarian ([facility]) reveals no clinically significant variant at ATM, BARD1, BRCA1, BRCA2, BRIP1, CDH1, CHEK2, EPCAM, FANCC, MLH1, MSH2, MSH6, NBN, PALB2, PMS2, PTEN, RAD51C, RAD51D, TP53, and XRCC2.       02/11/2014 -  Neo-Adjuvant Chemotherapy    Doxorubicin and cyclophosphamide X 4 followed by Taxol weekly x12       07/12/2014 Breast MRI    Partial response to neoadjuvant chemotherapy right breast mass decreased from 2.8 cm to 2.3 cm, 2 small masses along the medial margin also decreased; significant decrease right  breast mass 11 to 12:00 and 13 mm to 8 mm, decrease in right axilla LN      08/23/2014 Definitive Surgery    Right lumpectomy/SLNB [name]) 2:00: IDC grade 3, 3.2 cm, high-grade DCIS, LV I present, PNI present,; 12:00: IDC grade 30.8 cm, high-grade DCIS, LV I, 4/4 lymph nodes positive with ECE, ER 90%, PR 40%, HER-2 negative margin positive      08/23/2014 Pathologic Stage    Stage IIIA: ypT2 ypN2a      09/15/2014 Surgery    Reexcision of margins: clear; no evidence of malignancy      11/03/2014 - 12/14/2014 Radiation Therapy    Adjuvant XRT Isidore Moos): Right Breast, supraclavicular nodes, axillary nodes, and internal mammary nodes: 50 Gy over 25 fractions; right breast boost: 10 Gy over 5 fractions. Total dose: 60 Gy      12/29/2014 -  Anti-estrogen oral therapy    Tamoxifen 20 mg daily      04/08/2015 Survivorship    Survivorship care plan completed and mailed to patient in lieu of in person visit at her request      12/31/2015 Relapse/Recurrence    Innumerable osseous metastases with acute pathologic fracture the L4 vertebral body. 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 L5-S1 protrusion with left more than right S1 impingement      01/05/2016 - 01/12/2016 Radiation Therapy    Palliative radiation to the spine for cord compression  01/13/2016 -  Anti-estrogen oral therapy    Ribociclib 3 weeks on and 1 week off and Letrozole (with Zolodex until oophorectomy) and Xgeva for bone mets      06/17/2017 Pathology Results    Liver biopsy: Metastatic carcinoma: positive for cytokeratin 7, GCDFP, and GATA-3 but negative for cytokeratin 20. The immunoprofile is consistent with metastasis from the patient's known breast carcinoma.  ER 0%, PR 0%, Ki-67 20%, HER-2 negative ratio 1.23       CHIEF COMPLIANT: Cycle 1 day 1 Halaven  INTERVAL HISTORY: Debbie Martinez is a 42 year old with above-mentioned history metastatic breast cancer who is currently on  chemotherapy with Halaven.  She is today to receive cycle 1 day 1 of her treatment.    REVIEW OF SYSTEMS:   Constitutional: Denies fevers, chills or abnormal weight loss Eyes: Denies blurriness of vision Ears, nose, mouth, throat, and face: Denies mucositis or sore throat Respiratory: Denies cough, dyspnea or wheezes Cardiovascular: Denies palpitation, chest discomfort Gastrointestinal:  Denies nausea, heartburn or change in bowel habits Skin: Denies abnormal skin rashes Lymphatics: Denies new lymphadenopathy or easy bruising Neurological:Denies numbness, tingling or new weaknesses Behavioral/Psych: Mood is stable, no new changes  Extremities: No lower extremity edema  All other systems were reviewed with the patient and are negative.  I have reviewed the past medical history, past surgical history, social history and family history with the patient and they are unchanged from previous note.  ALLERGIES:  is allergic to lisinopril and tamoxifen.  MEDICATIONS:  Current Outpatient Medications  Medication Sig Dispense Refill  . ACCU-CHEK AVIVA PLUS test strip USE AS DIRECTED 4 TIMES A DAY 1 each 12  . ACCU-CHEK SOFTCLIX LANCETS lancets 100 each by Other route 4 (four) times daily. Use as instructed 100 each 0  . atenolol (TENORMIN) 50 MG tablet Take 50 mg by mouth daily.    . blood glucose meter kit and supplies KIT Dispense based on patient and insurance preference. Use up to four times daily as directed. (FOR ICD-9 250.00, 250.01). 1 each 0  . Cholecalciferol (VITAMIN D-3 PO) Take 1 capsule by mouth daily.    . diazepam (VALIUM) 5 MG tablet Take 1 tablet (5 mg total) by mouth 2 (two) times daily. (Patient not taking: Reported on 03/14/2016) 10 tablet 0  . ENSURE (ENSURE) Take 237 mLs by mouth 2 (two) times daily between meals.     . gabapentin (NEURONTIN) 300 MG capsule Take 1 capsule (300 mg total) by mouth 3 (three) times daily. 90 capsule 5  . Iron-Vitamins (GERITOL) LIQD Take 5 mLs  by mouth daily.    Marland Kitchen lidocaine-prilocaine (EMLA) cream Apply to affected area once 30 g 3  . Lidocaine-Prilocaine, Bulk, 2.5-2.5 % CREA Apply 5 mLs topically as needed. 2 hours before use, cover with plawtic wrap. 30 g 2  . Magnesium Salicylate (DOANS PILLS PO) Take 2 tablets by mouth daily as needed (for pain).    . metFORMIN (GLUCOPHAGE) 500 MG tablet Take 1 tablet (500 mg total) by mouth 2 (two) times daily with a meal. (Patient not taking: Reported on 05/30/2016) 60 tablet 0  . methocarbamol (ROBAXIN) 500 MG tablet Take 1 tablet (500 mg total) by mouth 2 (two) times daily as needed for muscle spasms. (Patient taking differently: Take 1,000 mg by mouth 4 (four) times daily as needed for muscle spasms. ) 20 tablet 0  . omeprazole (PRILOSEC) 20 MG capsule Take 1 capsule (20 mg total) by mouth daily. Take while using  dexamethasone to prevent heartburn. 30 capsule 2  . ondansetron (ZOFRAN) 8 MG tablet Take 1 tablet (8 mg total) by mouth 2 (two) times daily as needed (Nausea or vomiting). 30 tablet 1  . oxyCODONE-acetaminophen (PERCOCET/ROXICET) 5-325 MG tablet Take 1 tablet by mouth every 6 (six) hours as needed for severe pain. 120 tablet 0  . polyethylene glycol (MIRALAX / GLYCOLAX) packet Take 17 g by mouth daily. 14 each 0  . prochlorperazine (COMPAZINE) 10 MG tablet Take 1 tablet (10 mg total) by mouth every 6 (six) hours as needed (Nausea or vomiting). 30 tablet 1  . traMADol (ULTRAM) 50 MG tablet Take 50-100 mg by mouth every 6 (six) hours as needed (for pain).    . valsartan (DIOVAN) 80 MG tablet Take 1 tablet (80 mg total) by mouth daily. 90 tablet 3   No current facility-administered medications for this visit.    Facility-Administered Medications Ordered in Other Visits  Medication Dose Route Frequency Provider Last Rate Last Dose  . sodium chloride flush (NS) 0.9 % injection 10 mL  10 mL Intracatheter PRN [name], MD   10 mL at 07/02/17 1244    PHYSICAL EXAMINATION: ECOG  PERFORMANCE STATUS: 1 - Symptomatic but completely ambulatory  Vitals:   07/02/17 1035  BP: 128/88  Pulse: 95  Resp: 18  Temp: 98.3 F (36.8 C)  SpO2: 100%   Filed Weights   07/02/17 1035  Weight: 173 lb 1.6 oz (78.5 kg)    GENERAL:alert, no distress and comfortable SKIN: skin color, texture, turgor are normal, no rashes or significant lesions EYES: normal, Conjunctiva are pink and non-injected, sclera clear OROPHARYNX:no exudate, no erythema and lips, buccal mucosa, and tongue normal  NECK: supple, thyroid normal size, non-tender, without nodularity LYMPH:  no palpable lymphadenopathy in the cervical, axillary or inguinal LUNGS: clear to auscultation and percussion with normal breathing effort HEART: regular rate & rhythm and no murmurs and no lower extremity edema ABDOMEN:abdomen soft, non-tender and normal bowel sounds MUSCULOSKELETAL:no cyanosis of digits and no clubbing  NEURO: alert & oriented x 3 with fluent speech, no focal motor/sensory deficits EXTREMITIES: No lower extremity edema  LABORATORY DATA:  I have reviewed the data as listed CMP Latest Ref Rng & Units 07/02/2017 06/17/2017 05/07/2017  Glucose 70 - 140 mg/dL 147(H) 144(H) 198(H)  BUN 7 - 26 mg/dL _0 Creatinine 0.60 - 1.10 mg/dL 0.71 0.81 0.95  Sodium 136 - 145 mmol/L 139 141 139  Potassium 3.5 - 5.1 mmol/L 3.9 3.0(L) 3.8  Chloride 98 - 109 mmol/L 105 105 105  CO2 22 - 29 mmol/L _1 Calcium 8.4 - 10.4 mg/dL 9.8 8.6(L) 8.7  Total Protein 6.4 - 8.3 g/dL 7.7 7.6 6.9  Total Bilirubin 0.2 - 1.2 mg/dL 0.3 0.7 0.4  Alkaline Phos 40 - 150 U/L 85 75 70  AST 5 - 34 U/L _2 ALT 0 - 55 U/L _3 Lab Results  Component Value Date   WBC 4.4 07/02/2017   HGB 9.6 (L) 06/17/2017   HCT 26.9 (L) 07/02/2017   MCV 94.1 07/02/2017   PLT 381 07/02/2017   NEUTROABS 3.2 07/02/2017    ASSESSMENT & PLAN:  Breast cancer of upper-inner quadrant of right female breast ([place]) Breast cancer of  upper-inner quadrant of right female breast ([place]) 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   Treatment summary:  1. Patient completed neoadjuvant dose dense Adriamycin and Cytoxan and weekly Taxol X 12 started 02/09/2014 and completed 07/01/14 2. Right double lumpectomy 2:00: IDC grade 3, 3.2 cm, high-grade DCIS, LV I present, PNI present,; 12:00: IDC grade 3; 0.8 cm, high-grade DCIS, LVI, 4/4 lymph nodes positive with ECE, ER 90%, PR 40%, HER-2 negative margin positive T2 N2 M0 stage IIIa 3. Patient is on [facility] clinical trial and she was randomized intraoperatively for NO lymph node dissection. 4.Tamoxifen 20 mg daily started 12/29/2014 stopped July 2016 for adverse effects 5. 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 Palliative XRT to the spine for cord compression 8. Ribociclibwith letrozole plus ovarian suppression started 01/13/2016-06/25/2017  ---------------------------------------------------------------------------------------------------------------------------------------------------------  CT chest abdomen pelvis: There are over 20 masses scattered throughout the liver   Liver biopsy 06/17/2017  Metastatic carcinoma: positive for cytokeratin 7, GCDFP, and GATA-3 but negative for cytokeratin 20. The immunoprofile is consistent with metastasis from the patient's known breast carcinoma.  ER 0%, PR 0%, Ki-67 20%, HER-2 negative ratio 1.23  Current treatment plan: Halaven days 1 and 8 every 3 weeks, today is cycle 1 day 1 Bone Mets: Xgeva with calcium and vitamin D Chemo toxicities: I counseled her extensively regarding chemo toxicities.  I instructed her to Her nausea medications. Upper respiratory infection: Treated with  antibiotics  Return to clinic in 1 week for cycle 1 day 8 of Halaven  I spent 25 minutes talking to the patient of which more than half was spent in counseling and coordination of care.  No orders of the defined types were placed in this encounter.  The patient has a good understanding of the overall plan. she agrees with it. she will call with any problems that may develop before the next visit here.   Harriette Ohara, MD 07/02/17

## 2017-07-02 NOTE — Progress Notes (Signed)
Per Dr.Gudena, pt to only receive xgeva today. No zoladex injection today. Notified pharmacy and is aware.

## 2017-07-02 NOTE — Patient Instructions (Signed)
[facility] Discharge Instructions for Patients Receiving Chemotherapy  Today you received the following chemotherapy agents: Eribulin mesylate ([place]).  To help prevent nausea and vomiting after your treatment, we encourage you to take your nausea medication as prescribed.  If you develop nausea and vomiting that is not controlled by your nausea medication, call the clinic.   BELOW ARE SYMPTOMS THAT SHOULD BE REPORTED IMMEDIATELY:  *FEVER GREATER THAN 100.5 F  *CHILLS WITH OR WITHOUT FEVER  NAUSEA AND VOMITING THAT IS NOT CONTROLLED WITH YOUR NAUSEA MEDICATION  *UNUSUAL SHORTNESS OF BREATH  *UNUSUAL BRUISING OR BLEEDING  TENDERNESS IN MOUTH AND THROAT WITH OR WITHOUT PRESENCE OF ULCERS  *URINARY PROBLEMS  *BOWEL PROBLEMS  UNUSUAL RASH Items with * indicate a potential emergency and should be followed up as soon as possible.  Feel free to call the clinic should you have any questions or concerns. The clinic phone number is [phone].  Please show the [place] at check-in to the Emergency Department and triage nurse.  Eribulin solution for injection What is this medicine? ERIBULIN (er e bu lin) is a chemotherapy drug. It is used to treat breast cancer and liposarcoma. This medicine may be used for other purposes; ask your health care provider or pharmacist if you have questions. COMMON BRAND NAME(S): Halaven What should I tell my health care provider before I take this medicine? They need to know if you have any of these conditions: -heart disease -history of irregular heartbeat -kidney disease -liver disease -low blood counts, like low white cell, platelet, or red cell counts -low levels of potassium or magnesium in the blood -an unusual or allergic reaction to eribulin, other medicines, foods, dyes, or preservatives -pregnant or trying to get pregnant -breast-feeding How should I use this medicine? This medicine is for infusion into a  vein. It is given by a health care professional in a hospital or clinic setting. Talk to your pediatrician regarding the use of this medicine in children. Special care may be needed. Overdosage: If you think you have taken too much of this medicine contact a poison control center or emergency room at once. NOTE: This medicine is only for you. Do not share this medicine with others. What if I miss a dose? It is important not to miss your dose. Call your doctor or health care professional if you are unable to keep an appointment. What may interact with this medicine? Do not take this medicine with any of the following medications: -amiodarone -astemizole -arsenic trioxide -bepridil -bretylium -chloroquine -chlorpromazine -cisapride -clarithromycin -dextromethorphan, quinidine -disopyramide -dofetilide -droperidol -dronedarone -erythromycin -grepafloxacin -halofantrine -haloperidol -ibutilide -levomethadyl -mesoridazine -methadone -pentamidine -procainamide -quinidine -pimozide -posaconazole -probucol -propafenone -saquinavir -sotalol -sparfloxacin -terfenadine -thioridazine -troleandomycin -ziprasidone This list may not describe all possible interactions. Give your health care provider a list of all the medicines, herbs, non-prescription drugs, or dietary supplements you use. Also tell them if you smoke, drink alcohol, or use illegal drugs. Some items may interact with your medicine. What should I watch for while using this medicine? This drug may make you feel generally unwell. This is not uncommon, as chemotherapy can affect healthy cells as well as cancer cells. Report any side effects. Continue your course of treatment even though you feel ill unless your doctor tells you to stop. Call your doctor or health care professional for advice if you get a fever, chills or sore throat, or other symptoms of a cold or flu. Do not treat yourself. This drug decreases  your body's  ability to fight infections. Try to avoid being around people who are sick. This medicine may increase your risk to bruise or bleed. Call your doctor or health care professional if you notice any unusual bleeding. You may need blood work done while you are taking this medicine. Do not become pregnant while taking this medicine or for 2 weeks after stopping it. Women should inform their doctor if they wish to become pregnant or think they might be pregnant. Men should not father a child while taking this medicine and for 3.5 months after stopping it. There is a potential for serious side effects to an unborn child. Talk to your health care professional or pharmacist for more information. Do not breast-feed an infant while taking this medicine or for 2 weeks after stopping it. What side effects may I notice from receiving this medicine? Side effects that you should report to your doctor or health care professional as soon as possible: -allergic reactions like skin rash, itching or hives, swelling of the face, lips, or tongue -low blood counts - this medicine may decrease the number of white blood cells, red blood cells and platelets. You may be at increased risk for infections and bleeding. -signs of infection - fever or chills, cough, sore throat, pain or difficulty passing urine -signs of decreased platelets or bleeding - bruising, pinpoint red spots on the skin, black, tarry stools, blood in the urine -signs of decreased red blood cells - unusually weak or tired, fainting spells, lightheadedness -pain, tingling, numbness in the hands or feet Side effects that usually do not require medical attention (report to your doctor or health care professional if they continue or are bothersome): -constipation -hair loss -headache -loss of appetite -muscle or joint pain -nausea, vomiting -stomach pain This list may not describe all possible side effects. Call your doctor for medical advice about side  effects. You may report side effects to FDA at 1-800-FDA-1088. Where should I keep my medicine? This drug is given in a hospital or clinic and will not be stored at home. NOTE: This sheet is a summary. It may not cover all possible information. If you have questions about this medicine, talk to your doctor, pharmacist, or health care provider.  2018 Elsevier/Gold Standard (2015-05-12 10:11:26)

## 2017-07-04 ENCOUNTER — Telehealth: Payer: Self-pay | Admitting: *Deleted

## 2017-07-04 NOTE — Telephone Encounter (Signed)
-----   Message from Zola Button, RN sent at 07/02/2017 12:56 PM EDT ----- Regarding: Dr. Lindi Adie; First time F/U Call Pt received first time Halaven. Tolerated well. Thank you!

## 2017-07-04 NOTE — Telephone Encounter (Signed)
TCT patient to follow up with her after her 1st treatment with Halaven. Spoke with patient. She states she is tired and is having some joint pain.  She has percocet and gabapentin available and is taking these as directed.  She also c/o increased sweating-no specific time , just all the time. Denies fevers/chills.  Encouraged to keep well hydrated. Reviewed appropriate fluids to keep drinking and to have on hand.  She voiced understanding. She is aware of her upcoming appts and how to contact us with any questions or concerns

## 2017-07-09 ENCOUNTER — Inpatient Hospital Stay: Payer: Medicare Other

## 2017-07-09 ENCOUNTER — Inpatient Hospital Stay (HOSPITAL_BASED_OUTPATIENT_CLINIC_OR_DEPARTMENT_OTHER): Payer: Medicare Other | Admitting: Hematology and Oncology

## 2017-07-09 ENCOUNTER — Telehealth: Payer: Self-pay

## 2017-07-09 ENCOUNTER — Encounter: Payer: Self-pay | Admitting: *Deleted

## 2017-07-09 DIAGNOSIS — C773 Secondary and unspecified malignant neoplasm of axilla and upper limb lymph nodes: Secondary | ICD-10-CM | POA: Diagnosis not present

## 2017-07-09 DIAGNOSIS — C50211 Malignant neoplasm of upper-inner quadrant of right female breast: Secondary | ICD-10-CM

## 2017-07-09 DIAGNOSIS — Z170 Estrogen receptor positive status [ER+]: Principal | ICD-10-CM

## 2017-07-09 DIAGNOSIS — R05 Cough: Secondary | ICD-10-CM | POA: Diagnosis not present

## 2017-07-09 DIAGNOSIS — Z5111 Encounter for antineoplastic chemotherapy: Secondary | ICD-10-CM | POA: Diagnosis not present

## 2017-07-09 DIAGNOSIS — R6889 Other general symptoms and signs: Secondary | ICD-10-CM | POA: Diagnosis not present

## 2017-07-09 DIAGNOSIS — C787 Secondary malignant neoplasm of liver and intrahepatic bile duct: Secondary | ICD-10-CM

## 2017-07-09 DIAGNOSIS — C50919 Malignant neoplasm of unspecified site of unspecified female breast: Secondary | ICD-10-CM

## 2017-07-09 DIAGNOSIS — C7951 Secondary malignant neoplasm of bone: Secondary | ICD-10-CM

## 2017-07-09 LAB — COMPREHENSIVE METABOLIC PANEL
ALBUMIN: 3.4 g/dL — AB (ref 3.5–5.0)
ALK PHOS: 85 U/L (ref 40–150)
ALT: 100 U/L — AB (ref 0–55)
AST: 58 U/L — ABNORMAL HIGH (ref 5–34)
Anion gap: 9 (ref 3–11)
BILIRUBIN TOTAL: 0.3 mg/dL (ref 0.2–1.2)
BUN: 7 mg/dL (ref 7–26)
CALCIUM: 8.8 mg/dL (ref 8.4–10.4)
CO2: 24 mmol/L (ref 22–29)
CREATININE: 0.69 mg/dL (ref 0.60–1.10)
Chloride: 106 mmol/L (ref 98–109)
GFR calc Af Amer: >60 mL/min (ref >60)
GFR calc non Af Amer: >60 mL/min (ref >60)
GLUCOSE: 159 mg/dL — AB (ref 70–140)
Potassium: 3.8 mmol/L (ref 3.5–5.1)
Sodium: 139 mmol/L (ref 136–145)
TOTAL PROTEIN: 7.2 g/dL (ref 6.4–8.3)

## 2017-07-09 LAB — CBC WITH DIFFERENTIAL/PLATELET
BASOS PCT: 1 %
Basophils Absolute: 0 10*3/uL (ref 0.0–0.1)
EOS ABS: 0 10*3/uL (ref 0.0–0.5)
EOS PCT: 1 %
HCT: 27 % — ABNORMAL LOW (ref 34.8–46.6)
Hemoglobin: 8.6 g/dL — ABNORMAL LOW (ref 11.6–15.9)
LYMPHS ABS: 0.5 10*3/uL — AB (ref 0.9–3.3)
Lymphocytes Relative: 16 %
MCH: 29 pg (ref 25.1–34.0)
MCHC: 31.7 g/dL (ref 31.5–36.0)
MCV: 91.6 fL (ref 79.5–101.0)
MONO ABS: 0.2 10*3/uL (ref 0.1–0.9)
MONOS PCT: 8 %
Neutro Abs: 2.4 10*3/uL (ref 1.5–6.5)
Neutrophils Relative %: 74 %
PLATELETS: 298 10*3/uL (ref 145–400)
RBC: 2.94 MIL/uL — ABNORMAL LOW (ref 3.70–5.45)
RDW: 17.7 % — AB (ref 11.2–14.5)
Smear Review: 1
WBC: 3.2 10*3/uL — ABNORMAL LOW (ref 3.9–10.3)
nRBC: 3 /100 WBC — ABNORMAL HIGH

## 2017-07-09 MED ORDER — HEPARIN SOD (PORK) LOCK FLUSH 100 UNIT/ML IV SOLN
500.0000 [IU] | Freq: Once | INTRAVENOUS | Status: AC | PRN
  Administered 2017-07-09: 500 [IU]
  Filled 2017-07-09: qty 5

## 2017-07-09 MED ORDER — SODIUM CHLORIDE 0.9% FLUSH
10.0000 mL | INTRAVENOUS | Status: DC | PRN
  Administered 2017-07-09: 10 mL
  Filled 2017-07-09: qty 10

## 2017-07-09 MED ORDER — PROCHLORPERAZINE MALEATE 10 MG PO TABS
ORAL_TABLET | ORAL | Status: AC
  Filled 2017-07-09: qty 1

## 2017-07-09 MED ORDER — SODIUM CHLORIDE 0.9 % IV SOLN
Freq: Once | INTRAVENOUS | Status: AC
  Administered 2017-07-09: 10:00:00 via INTRAVENOUS

## 2017-07-09 MED ORDER — PROCHLORPERAZINE MALEATE 10 MG PO TABS
10.0000 mg | ORAL_TABLET | Freq: Once | ORAL | Status: AC
  Administered 2017-07-09: 10 mg via ORAL

## 2017-07-09 MED ORDER — SODIUM CHLORIDE 0.9 % IV SOLN
1.4000 mg/m2 | Freq: Once | INTRAVENOUS | Status: AC
  Administered 2017-07-09: 2.7 mg via INTRAVENOUS
  Filled 2017-07-09: qty 5.4

## 2017-07-09 NOTE — Telephone Encounter (Signed)
Ok to treat with todays labs per Dr. Lindi Adie. Infusion RN aware.  Cyndia Bent RN

## 2017-07-09 NOTE — Patient Instructions (Signed)
[facility] Discharge Instructions for Patients Receiving Chemotherapy  Today you received the following chemotherapy agents Halaven To help prevent nausea and vomiting after your treatment, we encourage you to take your nausea medication as prescribed.   If you develop nausea and vomiting that is not controlled by your nausea medication, call the clinic.   BELOW ARE SYMPTOMS THAT SHOULD BE REPORTED IMMEDIATELY:  *FEVER GREATER THAN 100.5 F  *CHILLS WITH OR WITHOUT FEVER  NAUSEA AND VOMITING THAT IS NOT CONTROLLED WITH YOUR NAUSEA MEDICATION  *UNUSUAL SHORTNESS OF BREATH  *UNUSUAL BRUISING OR BLEEDING  TENDERNESS IN MOUTH AND THROAT WITH OR WITHOUT PRESENCE OF ULCERS  *URINARY PROBLEMS  *BOWEL PROBLEMS  UNUSUAL RASH Items with * indicate a potential emergency and should be followed up as soon as possible.  Feel free to call the clinic should you have any questions or concerns. The clinic phone number is [phone].  Please show the CHEMO ALERT CARD at check-in to the Emergency Department and triage nurse.   

## 2017-07-09 NOTE — Assessment & Plan Note (Signed)
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   Treatment summary:  1. Patient completed neoadjuvant dose dense Adriamycin and Cytoxan and weekly Taxol X 12 started 02/09/2014 and completed 07/01/14 2. Right double lumpectomy 2:00: IDC grade 3, 3.2 cm, high-grade DCIS, LV I present, PNI present,; 12:00: IDC grade 3; 0.8 cm, high-grade DCIS, LVI, 4/4 lymph nodes positive with ECE, ER 90%, PR 40%, HER-2 negative margin positive T2 N2 M0 stage IIIa 3. Patient is on [facility] clinical trial and she was randomized intraoperatively for NO lymph node dissection. 4.Tamoxifen 20 mg daily started 12/29/2014 stopped July 2016 for adverse effects 5. 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 Palliative XRT to the spine for cord compression 8.Ribociclibwith letrozole plus ovarian suppression started 01/13/2016-06/25/2017 ---------------------------------------------------------------------------------------------------------------------------------------------------------  CT chest abdomen pelvis: There are over 20 masses scattered throughout the liver  Liver biopsy 2/25/2019Metastatic carcinoma: positive for cytokeratin 7, GCDFP, and GATA-3 but negative for cytokeratin 20. The immunoprofile is consistent with metastasis from the patient's known breast carcinoma. ER 0%, PR 0%, Ki-67 20%, HER-2 negative ratio 1.23  Current treatmentplan:[place] days 1 and 8 every 3 weeks, today is cycle 1 day 1 Bone Mets: Xgeva with calcium and vitamin D Chemo toxicities:   Return to clinic in 2 weeks for cycle 2 day 1 of [place]

## 2017-07-09 NOTE — Progress Notes (Signed)
Patient Care Team: Lucianne Lei, MD as PCP - General (Family Medicine) [name], MD as Consulting Physician (General Surgery) [name], MD as Consulting Physician (Hematology and Oncology) [name], MD as Attending Physician (Radiation Oncology) Benson Norway, RN as Registered Nurse (Oncology) Sylvan Cheese, NP as Nurse Practitioner (Hematology and Oncology)  DIAGNOSIS:  Encounter Diagnosis  Name Primary?  . Malignant neoplasm of upper-inner quadrant of right breast in female, estrogen receptor positive ([place])     SUMMARY OF ONCOLOGIC HISTORY:   Breast cancer of upper-inner quadrant of right female breast ([place])   01/12/2014 Mammogram    Right breast - two lesions: #1 2:00 position 1.6 x 1.9 cm and #2 12:00 position 0.5 x 0.9 cm. Distance between the 2 was 4.5 cm, right axillary lymph node enlargement      01/12/2014 Initial Biopsy    Right breast 3 biopsies: All were IDC with DCIS ER+ PR + Ki-67 85% HER-2 negative: Right axillary lymph node also positive for metastatic cancer ER positive HER-2 negative Ki-67 80% grade 2      01/18/2014 Breast MRI    Right breast 12:00 position 2.8 x 2 x 2.2 cm: 2:00 position 1.3 x 0.8 x 0.5 cm, right axilla multiple enlarged lymph nodes 1 cm in size, right retropectoral lymph node 0.6 cm      01/18/2014 Clinical Stage    Stage IIB T2 N1      01/25/2014 Procedure    Breast/Ovarian ([facility]) reveals no clinically significant variant at ATM, BARD1, BRCA1, BRCA2, BRIP1, CDH1, CHEK2, EPCAM, FANCC, MLH1, MSH2, MSH6, NBN, PALB2, PMS2, PTEN, RAD51C, RAD51D, TP53, and XRCC2.       02/11/2014 -  Neo-Adjuvant Chemotherapy    Doxorubicin and cyclophosphamide X 4 followed by Taxol weekly x12       07/12/2014 Breast MRI    Partial response to neoadjuvant chemotherapy right breast mass decreased from 2.8 cm to 2.3 cm, 2 small masses along the medial margin also decreased; significant decrease right breast mass 11 to 12:00 and 13 mm  to 8 mm, decrease in right axilla LN      08/23/2014 Definitive Surgery    Right lumpectomy/SLNB [name]) 2:00: IDC grade 3, 3.2 cm, high-grade DCIS, LV I present, PNI present,; 12:00: IDC grade 30.8 cm, high-grade DCIS, LV I, 4/4 lymph nodes positive with ECE, ER 90%, PR 40%, HER-2 negative margin positive      08/23/2014 Pathologic Stage    Stage IIIA: ypT2 ypN2a      09/15/2014 Surgery    Reexcision of margins: clear; no evidence of malignancy      11/03/2014 - 12/14/2014 Radiation Therapy    Adjuvant XRT Isidore Moos): Right Breast, supraclavicular nodes, axillary nodes, and internal mammary nodes: 50 Gy over 25 fractions; right breast boost: 10 Gy over 5 fractions. Total dose: 60 Gy      12/29/2014 -  Anti-estrogen oral therapy    Tamoxifen 20 mg daily      04/08/2015 Survivorship    Survivorship care plan completed and mailed to patient in lieu of in person visit at her request      12/31/2015 Relapse/Recurrence    Innumerable osseous metastases with acute pathologic fracture the L4 vertebral body. 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 L5-S1 protrusion with left more than right S1 impingement      01/05/2016 - 01/12/2016 Radiation Therapy    Palliative radiation to the spine for cord compression      01/13/2016 -  Anti-estrogen oral therapy    Ribociclib 3 weeks on and 1 week off and Letrozole (with Zolodex until oophorectomy) and Xgeva for bone mets      06/17/2017 Pathology Results    Liver biopsy: Metastatic carcinoma: positive for cytokeratin 7, GCDFP, and GATA-3 but negative for cytokeratin 20. The immunoprofile is consistent with metastasis from the patient's known breast carcinoma.  ER 0%, PR 0%, Ki-67 20%, HER-2 negative ratio 1.23       CHIEF COMPLIANT: Cycle 1 day 8 [place]  INTERVAL HISTORY: Debbie Martinez is a 42 year old with above-mentioned history of metastatic breast cancer who is here to receive cycle 1 day 8 of [place].  She  reports some fatigue and nausea related to the treatment.  She did not have any emesis.  She has been eating fairly well.  She does not have any other GI side effects.  REVIEW OF SYSTEMS:   Constitutional: Denies fevers, chills or abnormal weight loss Eyes: Denies blurriness of vision Ears, nose, mouth, throat, and face: Denies mucositis or sore throat Respiratory: Denies cough, dyspnea or wheezes Cardiovascular: Denies palpitation, chest discomfort Gastrointestinal:  Denies nausea, heartburn or change in bowel habits Skin: Denies abnormal skin rashes Lymphatics: Denies new lymphadenopathy or easy bruising Neurological:Denies numbness, tingling or new weaknesses Behavioral/Psych: Mood is stable, no new changes  Extremities: No lower extremity edema  All other systems were reviewed with the patient and are negative.  I have reviewed the past medical history, past surgical history, social history and family history with the patient and they are unchanged from previous note.  ALLERGIES:  is allergic to lisinopril and tamoxifen.  MEDICATIONS:  Current Outpatient Medications  Medication Sig Dispense Refill  . ACCU-CHEK AVIVA PLUS test strip USE AS DIRECTED 4 TIMES A DAY 1 each 12  . ACCU-CHEK SOFTCLIX LANCETS lancets 100 each by Other route 4 (four) times daily. Use as instructed 100 each 0  . atenolol (TENORMIN) 50 MG tablet Take 50 mg by mouth daily.    . blood glucose meter kit and supplies KIT Dispense based on patient and insurance preference. Use up to four times daily as directed. (FOR ICD-9 250.00, 250.01). 1 each 0  . Cholecalciferol (VITAMIN D-3 PO) Take 1 capsule by mouth daily.    . diazepam (VALIUM) 5 MG tablet Take 1 tablet (5 mg total) by mouth 2 (two) times daily. (Patient not taking: Reported on 03/14/2016) 10 tablet 0  . ENSURE (ENSURE) Take 237 mLs by mouth 2 (two) times daily between meals.     . gabapentin (NEURONTIN) 300 MG capsule Take 1 capsule (300 mg total) by  mouth 3 (three) times daily. 90 capsule 5  . Iron-Vitamins (GERITOL) LIQD Take 5 mLs by mouth daily.    Marland Kitchen lidocaine-prilocaine (EMLA) cream Apply to affected area once 30 g 3  . Lidocaine-Prilocaine, Bulk, 2.5-2.5 % CREA Apply 5 mLs topically as needed. 2 hours before use, cover with plawtic wrap. 30 g 2  . Magnesium Salicylate (DOANS PILLS PO) Take 2 tablets by mouth daily as needed (for pain).    . metFORMIN (GLUCOPHAGE) 500 MG tablet Take 1 tablet (500 mg total) by mouth 2 (two) times daily with a meal. (Patient not taking: Reported on 05/30/2016) 60 tablet 0  . methocarbamol (ROBAXIN) 500 MG tablet Take 1 tablet (500 mg total) by mouth 2 (two) times daily as needed for muscle spasms. (Patient taking differently: Take 1,000 mg by mouth 4 (four) times daily as needed for muscle spasms. )  20 tablet 0  . omeprazole (PRILOSEC) 20 MG capsule Take 1 capsule (20 mg total) by mouth daily. Take while using dexamethasone to prevent heartburn. 30 capsule 2  . ondansetron (ZOFRAN) 8 MG tablet Take 1 tablet (8 mg total) by mouth 2 (two) times daily as needed (Nausea or vomiting). 30 tablet 1  . oxyCODONE-acetaminophen (PERCOCET/ROXICET) 5-325 MG tablet Take 1 tablet by mouth every 6 (six) hours as needed for severe pain. 120 tablet 0  . polyethylene glycol (MIRALAX / GLYCOLAX) packet Take 17 g by mouth daily. 14 each 0  . prochlorperazine (COMPAZINE) 10 MG tablet Take 1 tablet (10 mg total) by mouth every 6 (six) hours as needed (Nausea or vomiting). 30 tablet 1  . traMADol (ULTRAM) 50 MG tablet Take 50-100 mg by mouth every 6 (six) hours as needed (for pain).    . valsartan (DIOVAN) 80 MG tablet Take 1 tablet (80 mg total) by mouth daily. 90 tablet 3   No current facility-administered medications for this visit.     PHYSICAL EXAMINATION: ECOG PERFORMANCE STATUS: 1 - Symptomatic but completely ambulatory  Vitals:   07/09/17 0942  BP: (!) 141/84  Pulse: 94  Resp: 17  Temp: 98.5 F (36.9 C)  SpO2: 100%    Filed Weights   07/09/17 0942  Weight: 175 lb 9.6 oz (79.7 kg)    GENERAL:alert, no distress and comfortable SKIN: skin color, texture, turgor are normal, no rashes or significant lesions EYES: normal, Conjunctiva are pink and non-injected, sclera clear OROPHARYNX:no exudate, no erythema and lips, buccal mucosa, and tongue normal  NECK: supple, thyroid normal size, non-tender, without nodularity LYMPH:  no palpable lymphadenopathy in the cervical, axillary or inguinal LUNGS: clear to auscultation and percussion with normal breathing effort HEART: regular rate & rhythm and no murmurs and no lower extremity edema ABDOMEN:abdomen soft, non-tender and normal bowel sounds MUSCULOSKELETAL:no cyanosis of digits and no clubbing  NEURO: alert & oriented x 3 with fluent speech, no focal motor/sensory deficits EXTREMITIES: No lower extremity edema  LABORATORY DATA:  I have reviewed the data as listed CMP Latest Ref Rng & Units 07/02/2017 06/17/2017 05/07/2017  Glucose 70 - 140 mg/dL 147(H) 144(H) 198(H)  BUN 7 - 26 mg/dL _0 Creatinine 0.60 - 1.10 mg/dL 0.71 0.81 0.95  Sodium 136 - 145 mmol/L 139 141 139  Potassium 3.5 - 5.1 mmol/L 3.9 3.0(L) 3.8  Chloride 98 - 109 mmol/L 105 105 105  CO2 22 - 29 mmol/L _1 Calcium 8.4 - 10.4 mg/dL 9.8 8.6(L) 8.7  Total Protein 6.4 - 8.3 g/dL 7.7 7.6 6.9  Total Bilirubin 0.2 - 1.2 mg/dL 0.3 0.7 0.4  Alkaline Phos 40 - 150 U/L 85 75 70  AST 5 - 34 U/L _2 ALT 0 - 55 U/L _3 Lab Results  Component Value Date   WBC 3.2 (L) 07/09/2017   HGB 8.6 (L) 07/09/2017   HCT 27.0 (L) 07/09/2017   MCV 91.6 07/09/2017   PLT 298 07/09/2017   NEUTROABS PENDING 07/09/2017    ASSESSMENT & PLAN:  Breast cancer of upper-inner quadrant of right female breast ([facility]) 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    Treatment summary:  1. Patient completed neoadjuvant dose dense Adriamycin and Cytoxan and weekly Taxol X 12 started 02/09/2014 and completed 07/01/14 2. Right double lumpectomy  2:00: IDC grade 3, 3.2 cm, high-grade DCIS, LV I present, PNI present,; 12:00: IDC grade 3; 0.8 cm, high-grade DCIS, LVI, 4/4 lymph nodes positive with ECE, ER 90%, PR 40%, HER-2 negative margin positive T2 N2 M0 stage IIIa 3. Patient is on [facility] clinical trial and she was randomized intraoperatively for NO lymph node dissection. 4.Tamoxifen 20 mg daily started 12/29/2014 stopped July 2016 for adverse effects 5. 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 Palliative XRT to the spine for cord compression 8.Ribociclibwith letrozole plus ovarian suppression started 01/13/2016-06/25/2017 ---------------------------------------------------------------------------------------------------------------------------------------------------------  CT chest abdomen pelvis: There are over 20 masses scattered throughout the liver  Liver biopsy 2/25/2019Metastatic carcinoma: positive for cytokeratin 7, GCDFP, and GATA-3 but negative for cytokeratin 20. The immunoprofile is consistent with metastasis from the patient's known breast carcinoma. ER 0%, PR 0%, Ki-67 20%, HER-2 negative ratio 1.23  Current treatmentplan:[place] days 1 and 8 every 3 weeks, today is cycle 1 day 1 Bone Mets: Xgeva with calcium and vitamin D Chemo toxicities: 1. Nausea grade 1 2. fatigue  Return to clinic in 2 weeks for cycle 2 day 1 of [place] Our plan is to perform 3 cycles and then obtain scans.  I spent 25 minutes talking to the patient of which more than half was spent in counseling and coordination of care.  No orders of the defined types were placed in this encounter.  The patient has a good understanding of the overall  plan. she agrees with it. she will call with any problems that may develop before the next visit here.   Harriette Ohara, MD 07/09/17

## 2017-07-15 DIAGNOSIS — E1165 Type 2 diabetes mellitus with hyperglycemia: Secondary | ICD-10-CM | POA: Diagnosis not present

## 2017-07-15 DIAGNOSIS — J209 Acute bronchitis, unspecified: Secondary | ICD-10-CM | POA: Diagnosis not present

## 2017-07-15 DIAGNOSIS — C50011 Malignant neoplasm of nipple and areola, right female breast: Secondary | ICD-10-CM | POA: Diagnosis not present

## 2017-07-15 DIAGNOSIS — I10 Essential (primary) hypertension: Secondary | ICD-10-CM | POA: Diagnosis not present

## 2017-07-18 ENCOUNTER — Telehealth: Payer: Self-pay

## 2017-07-18 NOTE — Telephone Encounter (Signed)
Called patient regarding side effects she is currently feeling. Per Dr. Lindi Adie along with the current medications she is taking she can add Claritin as well. He also stated he will add premedications to next treatment. Cyndia Bent RN

## 2017-07-18 NOTE — Telephone Encounter (Signed)
Voicemail received from patient reporting really bad spasms in chest and side. Upon return call to patient, patient disclosed the feelings of "strong spasms, like muscle cramps" since her last treatment. Patient rates intermittent spasms a 7 on 0-10 scale; unrelieved by pain medications. Denies shortness of breath or sharp, stabbing chest pain. Patient questions "is this normal?" Patient told to expect call back for further instruction. Patient encouraged to call back with any symptom changes or worsening. Information given to collaborative nurse, May, RN. States she will consult with Dr. Lindi Adie and follow-up with patient.

## 2017-07-23 ENCOUNTER — Encounter: Payer: Self-pay | Admitting: *Deleted

## 2017-07-23 ENCOUNTER — Inpatient Hospital Stay (HOSPITAL_BASED_OUTPATIENT_CLINIC_OR_DEPARTMENT_OTHER): Payer: Medicare Other | Admitting: Hematology and Oncology

## 2017-07-23 ENCOUNTER — Inpatient Hospital Stay: Payer: Medicare Other

## 2017-07-23 ENCOUNTER — Inpatient Hospital Stay: Payer: Medicare Other | Attending: Hematology and Oncology

## 2017-07-23 VITALS — BP 157/90 | HR 97 | Temp 98.4°F | Resp 17 | Ht 67.0 in | Wt 175.9 lb

## 2017-07-23 DIAGNOSIS — Z5111 Encounter for antineoplastic chemotherapy: Secondary | ICD-10-CM | POA: Diagnosis not present

## 2017-07-23 DIAGNOSIS — R5383 Other fatigue: Secondary | ICD-10-CM

## 2017-07-23 DIAGNOSIS — C7951 Secondary malignant neoplasm of bone: Secondary | ICD-10-CM | POA: Insufficient documentation

## 2017-07-23 DIAGNOSIS — C50211 Malignant neoplasm of upper-inner quadrant of right female breast: Secondary | ICD-10-CM

## 2017-07-23 DIAGNOSIS — Z170 Estrogen receptor positive status [ER+]: Secondary | ICD-10-CM

## 2017-07-23 DIAGNOSIS — R252 Cramp and spasm: Secondary | ICD-10-CM | POA: Insufficient documentation

## 2017-07-23 DIAGNOSIS — C50919 Malignant neoplasm of unspecified site of unspecified female breast: Secondary | ICD-10-CM

## 2017-07-23 DIAGNOSIS — R110 Nausea: Secondary | ICD-10-CM | POA: Diagnosis not present

## 2017-07-23 DIAGNOSIS — C787 Secondary malignant neoplasm of liver and intrahepatic bile duct: Secondary | ICD-10-CM | POA: Diagnosis not present

## 2017-07-23 LAB — CBC WITH DIFFERENTIAL (CANCER CENTER ONLY)
Basophils Absolute: 0 10*3/uL (ref 0.0–0.1)
Basophils Relative: 0 %
EOS ABS: 0 10*3/uL (ref 0.0–0.5)
Eosinophils Relative: 0 %
HCT: 27.2 % — ABNORMAL LOW (ref 34.8–46.6)
HEMOGLOBIN: 8.7 g/dL — AB (ref 11.6–15.9)
LYMPHS ABS: 0.6 10*3/uL — AB (ref 0.9–3.3)
Lymphocytes Relative: 12 %
MCH: 28.1 pg (ref 25.1–34.0)
MCHC: 32 g/dL (ref 31.5–36.0)
MCV: 87.8 fL (ref 79.5–101.0)
MONOS PCT: 11 %
Monocytes Absolute: 0.5 10*3/uL (ref 0.1–0.9)
NEUTROS ABS: 3.6 10*3/uL (ref 1.5–6.5)
NEUTROS PCT: 77 %
Platelet Count: 268 10*3/uL (ref 145–400)
RBC: 3.1 MIL/uL — ABNORMAL LOW (ref 3.70–5.45)
RDW: 19.1 % — ABNORMAL HIGH (ref 11.2–14.5)
WBC Count: 4.8 10*3/uL (ref 3.9–10.3)

## 2017-07-23 LAB — CMP (CANCER CENTER ONLY)
ALBUMIN: 3.5 g/dL (ref 3.5–5.0)
ALT: 40 U/L (ref 0–55)
AST: 24 U/L (ref 5–34)
Alkaline Phosphatase: 114 U/L (ref 40–150)
Anion gap: 9 (ref 3–11)
BUN: 6 mg/dL — ABNORMAL LOW (ref 7–26)
CHLORIDE: 106 mmol/L (ref 98–109)
CO2: 24 mmol/L (ref 22–29)
CREATININE: 0.71 mg/dL (ref 0.60–1.10)
Calcium: 8.9 mg/dL (ref 8.4–10.4)
GFR, Estimated: >60 mL/min (ref >60)
GLUCOSE: 236 mg/dL — AB (ref 70–140)
Potassium: 3.8 mmol/L (ref 3.5–5.1)
SODIUM: 139 mmol/L (ref 136–145)
Total Bilirubin: 0.3 mg/dL (ref 0.2–1.2)
Total Protein: 7.4 g/dL (ref 6.4–8.3)

## 2017-07-23 MED ORDER — SODIUM CHLORIDE 0.9% FLUSH
10.0000 mL | INTRAVENOUS | Status: DC | PRN
  Administered 2017-07-23: 10 mL
  Filled 2017-07-23: qty 10

## 2017-07-23 MED ORDER — SODIUM CHLORIDE 0.9 % IV SOLN
1.4000 mg/m2 | Freq: Once | INTRAVENOUS | Status: AC
  Administered 2017-07-23: 2.7 mg via INTRAVENOUS
  Filled 2017-07-23: qty 5.4

## 2017-07-23 MED ORDER — PROCHLORPERAZINE MALEATE 10 MG PO TABS
10.0000 mg | ORAL_TABLET | Freq: Once | ORAL | Status: AC
  Administered 2017-07-23: 10 mg via ORAL

## 2017-07-23 MED ORDER — SODIUM CHLORIDE 0.9 % IV SOLN
Freq: Once | INTRAVENOUS | Status: AC
  Administered 2017-07-23: 11:00:00 via INTRAVENOUS

## 2017-07-23 MED ORDER — HEPARIN SOD (PORK) LOCK FLUSH 100 UNIT/ML IV SOLN
500.0000 [IU] | Freq: Once | INTRAVENOUS | Status: AC | PRN
  Administered 2017-07-23: 500 [IU]
  Filled 2017-07-23: qty 5

## 2017-07-23 MED ORDER — PROCHLORPERAZINE MALEATE 10 MG PO TABS
ORAL_TABLET | ORAL | Status: AC
  Filled 2017-07-23: qty 1

## 2017-07-23 NOTE — Progress Notes (Signed)
Patient Care Team: Lucianne Lei, MD as PCP - General (Family Medicine) [name], MD as Consulting Physician (General Surgery) [name], MD as Consulting Physician (Hematology and Oncology) [name], MD as Attending Physician (Radiation Oncology) Benson Norway, RN as Registered Nurse (Oncology) Sylvan Cheese, NP as Nurse Practitioner (Hematology and Oncology)  DIAGNOSIS:  Encounter Diagnoses  Name Primary?  . Bone metastasis ([place]) Yes  . Malignant neoplasm of upper-inner quadrant of right breast in female, estrogen receptor positive ([place])     SUMMARY OF ONCOLOGIC HISTORY:   Breast cancer of upper-inner quadrant of right female breast ([place])   01/12/2014 Mammogram    Right breast - two lesions: #1 2:00 position 1.6 x 1.9 cm and #2 12:00 position 0.5 x 0.9 cm. Distance between the 2 was 4.5 cm, right axillary lymph node enlargement      01/12/2014 Initial Biopsy    Right breast 3 biopsies: All were IDC with DCIS ER+ PR + Ki-67 85% HER-2 negative: Right axillary lymph node also positive for metastatic cancer ER positive HER-2 negative Ki-67 80% grade 2      01/18/2014 Breast MRI    Right breast 12:00 position 2.8 x 2 x 2.2 cm: 2:00 position 1.3 x 0.8 x 0.5 cm, right axilla multiple enlarged lymph nodes 1 cm in size, right retropectoral lymph node 0.6 cm      01/18/2014 Clinical Stage    Stage IIB T2 N1      01/25/2014 Procedure    Breast/Ovarian ([facility]) reveals no clinically significant variant at ATM, BARD1, BRCA1, BRCA2, BRIP1, CDH1, CHEK2, EPCAM, FANCC, MLH1, MSH2, MSH6, NBN, PALB2, PMS2, PTEN, RAD51C, RAD51D, TP53, and XRCC2.       02/11/2014 -  Neo-Adjuvant Chemotherapy    Doxorubicin and cyclophosphamide X 4 followed by Taxol weekly x12       07/12/2014 Breast MRI    Partial response to neoadjuvant chemotherapy right breast mass decreased from 2.8 cm to 2.3 cm, 2 small masses along the medial margin also decreased; significant decrease right  breast mass 11 to 12:00 and 13 mm to 8 mm, decrease in right axilla LN      08/23/2014 Definitive Surgery    Right lumpectomy/SLNB [name]) 2:00: IDC grade 3, 3.2 cm, high-grade DCIS, LV I present, PNI present,; 12:00: IDC grade 30.8 cm, high-grade DCIS, LV I, 4/4 lymph nodes positive with ECE, ER 90%, PR 40%, HER-2 negative margin positive      08/23/2014 Pathologic Stage    Stage IIIA: ypT2 ypN2a      09/15/2014 Surgery    Reexcision of margins: clear; no evidence of malignancy      11/03/2014 - 12/14/2014 Radiation Therapy    Adjuvant XRT Isidore Moos): Right Breast, supraclavicular nodes, axillary nodes, and internal mammary nodes: 50 Gy over 25 fractions; right breast boost: 10 Gy over 5 fractions. Total dose: 60 Gy      12/29/2014 -  Anti-estrogen oral therapy    Tamoxifen 20 mg daily      04/08/2015 Survivorship    Survivorship care plan completed and mailed to patient in lieu of in person visit at her request      12/31/2015 Relapse/Recurrence    Innumerable osseous metastases with acute pathologic fracture the L4 vertebral body. 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 L5-S1 protrusion with left more than right S1 impingement      01/05/2016 - 01/12/2016 Radiation Therapy    Palliative radiation to the spine for cord compression  01/13/2016 -  Anti-estrogen oral therapy    Ribociclib 3 weeks on and 1 week off and Letrozole (with Zolodex until oophorectomy) and Xgeva for bone mets      06/17/2017 Pathology Results    Liver biopsy: Metastatic carcinoma: positive for cytokeratin 7, GCDFP, and GATA-3 but negative for cytokeratin 20. The immunoprofile is consistent with metastasis from the patient's known breast carcinoma.  ER 0%, PR 0%, Ki-67 20%, HER-2 negative ratio 1.23      07/02/2017 -  Chemotherapy    Halaven day 1 day 8 every 3 weeks       CHIEF COMPLIANT: Cycle 2-day 1 Halaven  INTERVAL HISTORY: Debbie Martinez is a 42 year old with  above-mentioned history metastatic breast cancer with liver metastases who is here to receive her second cycle of chemotherapy with Halaven.  Overall she tolerated Halaven fairly well exception of mild nausea and fatigue.  She is also complaining of leg cramps as well as right-sided muscle stiffness and crampiness.  She is trying to drink more fluids.  REVIEW OF SYSTEMS:   Constitutional: Denies fevers, chills or abnormal weight loss Eyes: Denies blurriness of vision Ears, nose, mouth, throat, and face: Denies mucositis or sore throat Respiratory: Denies cough, dyspnea or wheezes Cardiovascular: Denies palpitation, chest discomfort Gastrointestinal:  Denies nausea, heartburn or change in bowel habits Skin: Denies abnormal skin rashes Lymphatics: Denies new lymphadenopathy or easy bruising Neurological:Denies numbness, tingling or new weaknesses Behavioral/Psych: Mood is stable, no new changes  Extremities: No lower extremity edema  All other systems were reviewed with the patient and are negative.  I have reviewed the past medical history, past surgical history, social history and family history with the patient and they are unchanged from previous note.  ALLERGIES:  is allergic to lisinopril and tamoxifen.  MEDICATIONS:  Current Outpatient Medications  Medication Sig Dispense Refill  . ACCU-CHEK AVIVA PLUS test strip USE AS DIRECTED 4 TIMES A DAY 1 each 12  . ACCU-CHEK SOFTCLIX LANCETS lancets 100 each by Other route 4 (four) times daily. Use as instructed 100 each 0  . atenolol (TENORMIN) 50 MG tablet Take 50 mg by mouth daily.    . blood glucose meter kit and supplies KIT Dispense based on patient and insurance preference. Use up to four times daily as directed. (FOR ICD-9 250.00, 250.01). 1 each 0  . Cholecalciferol (VITAMIN D-3 PO) Take 1 capsule by mouth daily.    . diazepam (VALIUM) 5 MG tablet Take 1 tablet (5 mg total) by mouth 2 (two) times daily. (Patient not taking: Reported  on 03/14/2016) 10 tablet 0  . ENSURE (ENSURE) Take 237 mLs by mouth 2 (two) times daily between meals.     . gabapentin (NEURONTIN) 300 MG capsule Take 1 capsule (300 mg total) by mouth 3 (three) times daily. 90 capsule 5  . Iron-Vitamins (GERITOL) LIQD Take 5 mLs by mouth daily.    Marland Kitchen lidocaine-prilocaine (EMLA) cream Apply to affected area once 30 g 3  . Lidocaine-Prilocaine, Bulk, 2.5-2.5 % CREA Apply 5 mLs topically as needed. 2 hours before use, cover with plawtic wrap. 30 g 2  . Magnesium Salicylate (DOANS PILLS PO) Take 2 tablets by mouth daily as needed (for pain).    . metFORMIN (GLUCOPHAGE) 500 MG tablet Take 1 tablet (500 mg total) by mouth 2 (two) times daily with a meal. (Patient not taking: Reported on 05/30/2016) 60 tablet 0  . methocarbamol (ROBAXIN) 500 MG tablet Take 1 tablet (500 mg total) by mouth  2 (two) times daily as needed for muscle spasms. (Patient taking differently: Take 1,000 mg by mouth 4 (four) times daily as needed for muscle spasms. ) 20 tablet 0  . omeprazole (PRILOSEC) 20 MG capsule Take 1 capsule (20 mg total) by mouth daily. Take while using dexamethasone to prevent heartburn. 30 capsule 2  . ondansetron (ZOFRAN) 8 MG tablet Take 1 tablet (8 mg total) by mouth 2 (two) times daily as needed (Nausea or vomiting). 30 tablet 1  . oxyCODONE-acetaminophen (PERCOCET/ROXICET) 5-325 MG tablet Take 1 tablet by mouth every 6 (six) hours as needed for severe pain. 120 tablet 0  . polyethylene glycol (MIRALAX / GLYCOLAX) packet Take 17 g by mouth daily. 14 each 0  . prochlorperazine (COMPAZINE) 10 MG tablet Take 1 tablet (10 mg total) by mouth every 6 (six) hours as needed (Nausea or vomiting). 30 tablet 1  . traMADol (ULTRAM) 50 MG tablet Take 50-100 mg by mouth every 6 (six) hours as needed (for pain).    . valsartan (DIOVAN) 80 MG tablet Take 1 tablet (80 mg total) by mouth daily. 90 tablet 3   No current facility-administered medications for this visit.     PHYSICAL  EXAMINATION: ECOG PERFORMANCE STATUS: 1 - Symptomatic but completely ambulatory  Vitals:   07/23/17 1045  BP: (!) 157/90  Pulse: 97  Resp: 17  Temp: 98.4 F (36.9 C)  SpO2: 100%   Filed Weights   07/23/17 1045  Weight: 175 lb 14.4 oz (79.8 kg)    GENERAL:alert, no distress and comfortable SKIN: skin color, texture, turgor are normal, no rashes or significant lesions EYES: normal, Conjunctiva are pink and non-injected, sclera clear OROPHARYNX:no exudate, no erythema and lips, buccal mucosa, and tongue normal  NECK: supple, thyroid normal size, non-tender, without nodularity LYMPH:  no palpable lymphadenopathy in the cervical, axillary or inguinal LUNGS: clear to auscultation and percussion with normal breathing effort HEART: regular rate & rhythm and no murmurs and no lower extremity edema ABDOMEN:abdomen soft, non-tender and normal bowel sounds MUSCULOSKELETAL:no cyanosis of digits and no clubbing  NEURO: alert & oriented x 3 with fluent speech, no focal motor/sensory deficits EXTREMITIES: No lower extremity edema  LABORATORY DATA:  I have reviewed the data as listed CMP Latest Ref Rng & Units 07/09/2017 07/02/2017 06/17/2017  Glucose 70 - 140 mg/dL 159(H) 147(H) 144(H)  BUN 7 - 26 mg/dL _0 Creatinine 0.60 - 1.10 mg/dL 0.69 0.71 0.81  Sodium 136 - 145 mmol/L 139 139 141  Potassium 3.5 - 5.1 mmol/L 3.8 3.9 3.0(L)  Chloride 98 - 109 mmol/L 106 105 105  CO2 22 - 29 mmol/L _1 Calcium 8.4 - 10.4 mg/dL 8.8 9.8 8.6(L)  Total Protein 6.4 - 8.3 g/dL 7.2 7.7 7.6  Total Bilirubin 0.2 - 1.2 mg/dL 0.3 0.3 0.7  Alkaline Phos 40 - 150 U/L 85 85 75  AST 5 - 34 U/L 58(H) 25 22  ALT 0 - 55 U/L 100(H) 30 19    Lab Results  Component Value Date   WBC 4.8 07/23/2017   HGB 8.6 (L) 07/09/2017   HCT 27.2 (L) 07/23/2017   MCV 87.8 07/23/2017   PLT 268 07/23/2017   NEUTROABS 3.6 07/23/2017    ASSESSMENT & PLAN:  Breast cancer of upper-inner quadrant of right female breast  (H[facility]) 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  Treatment summary:  1. Patient completed neoadjuvant dose dense Adriamycin and Cytoxan and weekly Taxol X 12 started 02/09/2014 and completed 07/01/14 2. Right double lumpectomy 2:00: IDC grade 3, 3.2 cm, high-grade DCIS, LV I present, PNI present,; 12:00: IDC grade 3; 0.8 cm, high-grade DCIS, LVI, 4/4 lymph nodes positive with ECE, ER 90%, PR 40%, HER-2 negative margin positive T2 N2 M0 stage IIIa 3. Patient is on [employer] clinical trial and she was randomized intraoperatively for NO lymph node dissection. 4.Tamoxifen 20 mg daily started 12/29/2014 stopped July 2016 for adverse effects 5. 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 Palliative XRT to the spine for cord compression 8.Ribociclibwith letrozole plus ovarian suppression started 01/13/2016-06/25/2017 ---------------------------------------------------------------------------------------------------------------------------------------------------------  CT chest abdomen pelvis: There are over 20 masses scattered throughout the liver  Liver biopsy 2/25/2019Metastatic carcinoma: positive for cytokeratin 7, GCDFP, and GATA-3 but negative for cytokeratin 20. The immunoprofile is consistent with metastasis from the patient's known breast carcinoma. ER 0%, PR 0%, Ki-67 20%, HER-2 negative ratio 1.23  Current treatmentplan:Halaven days 1 and8every 3 weeks,today is cycle 2 day 1 Bone Mets: Xgeva with calcium and vitamin D Chemo toxicities: 1. Nausea grade 1 2. fatigue 3. Leg cramps: Encouraged her to drink more water as well as take tonic water at bedtime.  I will see her back in 1 week for cycle 2-day 8 Return to clinic in3weeks  for cycle 3day 1ofHalaven Our plan is to perform scans prior to cycle 4.   No orders of the defined types were placed in this encounter.  The patient has a good understanding of the overall plan. she agrees with it. she will call with any problems that may develop before the next visit here.   Harriette Ohara, MD 07/23/17

## 2017-07-23 NOTE — Patient Instructions (Signed)
[facility] Discharge Instructions for Patients Receiving Chemotherapy  Today you received the following chemotherapy agents Halaven To help prevent nausea and vomiting after your treatment, we encourage you to take your nausea medication as prescribed.   If you develop nausea and vomiting that is not controlled by your nausea medication, call the clinic.   BELOW ARE SYMPTOMS THAT SHOULD BE REPORTED IMMEDIATELY:  *FEVER GREATER THAN 100.5 F  *CHILLS WITH OR WITHOUT FEVER  NAUSEA AND VOMITING THAT IS NOT CONTROLLED WITH YOUR NAUSEA MEDICATION  *UNUSUAL SHORTNESS OF BREATH  *UNUSUAL BRUISING OR BLEEDING  TENDERNESS IN MOUTH AND THROAT WITH OR WITHOUT PRESENCE OF ULCERS  *URINARY PROBLEMS  *BOWEL PROBLEMS  UNUSUAL RASH Items with * indicate a potential emergency and should be followed up as soon as possible.  Feel free to call the clinic should you have any questions or concerns. The clinic phone number is [phone].  Please show the CHEMO ALERT CARD at check-in to the Emergency Department and triage nurse.   

## 2017-07-23 NOTE — Assessment & Plan Note (Signed)
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   Treatment summary:  1. Patient completed neoadjuvant dose dense Adriamycin and Cytoxan and weekly Taxol X 12 started 02/09/2014 and completed 07/01/14 2. Right double lumpectomy 2:00: IDC grade 3, 3.2 cm, high-grade DCIS, LV I present, PNI present,; 12:00: IDC grade 3; 0.8 cm, high-grade DCIS, LVI, 4/4 lymph nodes positive with ECE, ER 90%, PR 40%, HER-2 negative margin positive T2 N2 M0 stage IIIa 3. Patient is on [employer] clinical trial and she was randomized intraoperatively for NO lymph node dissection. 4.Tamoxifen 20 mg daily started 12/29/2014 stopped July 2016 for adverse effects 5. 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 Palliative XRT to the spine for cord compression 8.Ribociclibwith letrozole plus ovarian suppression started 01/13/2016-06/25/2017 ---------------------------------------------------------------------------------------------------------------------------------------------------------  CT chest abdomen pelvis: There are over 20 masses scattered throughout the liver  Liver biopsy 2/25/2019Metastatic carcinoma: positive for cytokeratin 7, GCDFP, and GATA-3 but negative for cytokeratin 20. The immunoprofile is consistent with metastasis from the patient's known breast carcinoma. ER 0%, PR 0%, Ki-67 20%, HER-2 negative ratio 1.23  Current treatmentplan:Halaven days 1 and8every 3 weeks,today is cycle 2 day 1 Bone Mets: Xgeva with calcium and vitamin D Chemo toxicities: 1. Nausea grade 1 2. fatigue  Return to clinic in3weeks for cycle 3day 1ofHalaven Our plan is to perform scans prior to cycle 4.

## 2017-07-29 NOTE — Assessment & Plan Note (Signed)
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   Treatment summary:  1. Patient completed neoadjuvant dose dense Adriamycin and Cytoxan and weekly Taxol X 12 started 02/09/2014 and completed 07/01/14 2. Right double lumpectomy 2:00: IDC grade 3, 3.2 cm, high-grade DCIS, LV I present, PNI present,; 12:00: IDC grade 3; 0.8 cm, high-grade DCIS, LVI, 4/4 lymph nodes positive with ECE, ER 90%, PR 40%, HER-2 negative margin positive T2 N2 M0 stage IIIa 3. Patient is on [employer] clinical trial and she was randomized intraoperatively for NO lymph node dissection. 4.Tamoxifen 20 mg daily started 12/29/2014 stopped July 2016 for adverse effects 5. 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 Palliative XRT to the spine for cord compression 8.Ribociclibwith letrozole plus ovarian suppression started 01/13/2016-06/25/2017 ---------------------------------------------------------------------------------------------------------------------------------------------------------  CT chest abdomen pelvis: There are over 20 masses scattered throughout the liver  Liver biopsy 2/25/2019Metastatic carcinoma: positive for cytokeratin 7, GCDFP, and GATA-3 but negative for cytokeratin 20. The immunoprofile is consistent with metastasis from the patient's known breast carcinoma. ER 0%, PR 0%, Ki-67 20%, HER-2 negative ratio 1.23  Current treatmentplan:Halaven days 1 and8every 3 weeks,today is cycle 3 day 1 Bone Mets: Xgeva with calcium and vitamin D Chemo toxicities: 1. Nausea grade 1 2. fatigue  Return to clinic in3weeks for cycle 4day 1ofHalaven Our plan is to perform scans prior to cycle 4.   

## 2017-07-30 ENCOUNTER — Inpatient Hospital Stay: Payer: Medicare Other

## 2017-07-30 ENCOUNTER — Inpatient Hospital Stay (HOSPITAL_BASED_OUTPATIENT_CLINIC_OR_DEPARTMENT_OTHER): Payer: Medicare Other | Admitting: Hematology and Oncology

## 2017-07-30 DIAGNOSIS — C50919 Malignant neoplasm of unspecified site of unspecified female breast: Secondary | ICD-10-CM

## 2017-07-30 DIAGNOSIS — Z170 Estrogen receptor positive status [ER+]: Secondary | ICD-10-CM | POA: Diagnosis not present

## 2017-07-30 DIAGNOSIS — C50211 Malignant neoplasm of upper-inner quadrant of right female breast: Secondary | ICD-10-CM | POA: Diagnosis not present

## 2017-07-30 DIAGNOSIS — R5383 Other fatigue: Secondary | ICD-10-CM

## 2017-07-30 DIAGNOSIS — Z95828 Presence of other vascular implants and grafts: Secondary | ICD-10-CM

## 2017-07-30 DIAGNOSIS — C787 Secondary malignant neoplasm of liver and intrahepatic bile duct: Secondary | ICD-10-CM

## 2017-07-30 DIAGNOSIS — R252 Cramp and spasm: Secondary | ICD-10-CM | POA: Diagnosis not present

## 2017-07-30 DIAGNOSIS — R110 Nausea: Secondary | ICD-10-CM

## 2017-07-30 DIAGNOSIS — C7951 Secondary malignant neoplasm of bone: Secondary | ICD-10-CM

## 2017-07-30 DIAGNOSIS — Z5111 Encounter for antineoplastic chemotherapy: Secondary | ICD-10-CM | POA: Diagnosis not present

## 2017-07-30 DIAGNOSIS — C50911 Malignant neoplasm of unspecified site of right female breast: Secondary | ICD-10-CM

## 2017-07-30 LAB — CBC WITH DIFFERENTIAL (CANCER CENTER ONLY)
Basophils Absolute: 0 10*3/uL (ref 0.0–0.1)
Basophils Relative: 0 %
EOS ABS: 0 10*3/uL (ref 0.0–0.5)
EOS PCT: 0 %
HCT: 27.1 % — ABNORMAL LOW (ref 34.8–46.6)
Hemoglobin: 8.1 g/dL — ABNORMAL LOW (ref 11.6–15.9)
LYMPHS ABS: 1 10*3/uL (ref 0.9–3.3)
Lymphocytes Relative: 19 %
MCH: 26.8 pg (ref 25.1–34.0)
MCHC: 29.9 g/dL — AB (ref 31.5–36.0)
MCV: 89.7 fL (ref 79.5–101.0)
MONO ABS: 0.2 10*3/uL (ref 0.1–0.9)
MONOS PCT: 4 %
Neutro Abs: 4 10*3/uL (ref 1.5–6.5)
Neutrophils Relative %: 77 %
PLATELETS: 281 10*3/uL (ref 145–400)
RBC: 3.02 MIL/uL — ABNORMAL LOW (ref 3.70–5.45)
RDW: 18.2 % — ABNORMAL HIGH (ref 11.2–14.5)
WBC Count: 5.2 10*3/uL (ref 3.9–10.3)

## 2017-07-30 LAB — CMP (CANCER CENTER ONLY)
ALT: 32 U/L (ref 0–55)
ANION GAP: 8 (ref 3–11)
AST: 20 U/L (ref 5–34)
Albumin: 3.5 g/dL (ref 3.5–5.0)
Alkaline Phosphatase: 98 U/L (ref 40–150)
BUN: 7 mg/dL (ref 7–26)
CHLORIDE: 107 mmol/L (ref 98–109)
CO2: 25 mmol/L (ref 22–29)
Calcium: 8.5 mg/dL (ref 8.4–10.4)
Creatinine: 0.66 mg/dL (ref 0.60–1.10)
Glucose, Bld: 176 mg/dL — ABNORMAL HIGH (ref 70–140)
Potassium: 3.8 mmol/L (ref 3.5–5.1)
SODIUM: 140 mmol/L (ref 136–145)
Total Bilirubin: 0.3 mg/dL (ref 0.2–1.2)
Total Protein: 7.2 g/dL (ref 6.4–8.3)

## 2017-07-30 MED ORDER — DENOSUMAB 120 MG/1.7ML ~~LOC~~ SOLN
SUBCUTANEOUS | Status: AC
  Filled 2017-07-30: qty 1.7

## 2017-07-30 MED ORDER — SODIUM CHLORIDE 0.9% FLUSH
10.0000 mL | INTRAVENOUS | Status: DC | PRN
  Administered 2017-07-30: 10 mL
  Filled 2017-07-30: qty 10

## 2017-07-30 MED ORDER — PROCHLORPERAZINE MALEATE 10 MG PO TABS
ORAL_TABLET | ORAL | Status: AC
  Filled 2017-07-30: qty 1

## 2017-07-30 MED ORDER — DENOSUMAB 120 MG/1.7ML ~~LOC~~ SOLN
120.0000 mg | Freq: Once | SUBCUTANEOUS | Status: AC
  Administered 2017-07-30: 120 mg via SUBCUTANEOUS

## 2017-07-30 MED ORDER — SODIUM CHLORIDE 0.9 % IV SOLN
Freq: Once | INTRAVENOUS | Status: AC
  Administered 2017-07-30: 11:00:00 via INTRAVENOUS

## 2017-07-30 MED ORDER — SODIUM CHLORIDE 0.9 % IV SOLN
1.4000 mg/m2 | Freq: Once | INTRAVENOUS | Status: AC
  Administered 2017-07-30: 2.7 mg via INTRAVENOUS
  Filled 2017-07-30: qty 5.4

## 2017-07-30 MED ORDER — SODIUM CHLORIDE 0.9% FLUSH
10.0000 mL | Freq: Once | INTRAVENOUS | Status: AC
  Administered 2017-07-30: 10 mL
  Filled 2017-07-30: qty 10

## 2017-07-30 MED ORDER — HEPARIN SOD (PORK) LOCK FLUSH 100 UNIT/ML IV SOLN
500.0000 [IU] | Freq: Once | INTRAVENOUS | Status: AC | PRN
  Administered 2017-07-30: 500 [IU]
  Filled 2017-07-30: qty 5

## 2017-07-30 MED ORDER — PROCHLORPERAZINE MALEATE 10 MG PO TABS
10.0000 mg | ORAL_TABLET | Freq: Once | ORAL | Status: AC
  Administered 2017-07-30: 10 mg via ORAL

## 2017-07-30 NOTE — Patient Instructions (Addendum)
[facility] Discharge Instructions for Patients Receiving Chemotherapy  Today you received the following chemotherapy agents: Eribulin mesylate ([place]).  To help prevent nausea and vomiting after your treatment, we encourage you to take your nausea medication as prescribed.  If you develop nausea and vomiting that is not controlled by your nausea medication, call the clinic.   BELOW ARE SYMPTOMS THAT SHOULD BE REPORTED IMMEDIATELY:  *FEVER GREATER THAN 100.5 F  *CHILLS WITH OR WITHOUT FEVER  NAUSEA AND VOMITING THAT IS NOT CONTROLLED WITH YOUR NAUSEA MEDICATION  *UNUSUAL SHORTNESS OF BREATH  *UNUSUAL BRUISING OR BLEEDING  TENDERNESS IN MOUTH AND THROAT WITH OR WITHOUT PRESENCE OF ULCERS  *URINARY PROBLEMS  *BOWEL PROBLEMS  UNUSUAL RASH Items with * indicate a potential emergency and should be followed up as soon as possible.  Feel free to call the clinic should you have any questions or concerns. The clinic phone number is [phone].  Please show the [facility] at check-in to the Emergency Department and triage nurse.  Take 1,000mg  of oral Calcium with Vitamin D daily for the next month.  Hypocalcemia, Adult Hypocalcemia is when the level of calcium in a person's blood is below normal. Calcium is a mineral that is used by the body in many ways. A lack of blood calcium can affect the heart and muscles, make the bones more likely to break, and cause other problems. What are the causes? This condition may be caused by:  Decreased production (hypoparathyroidism) or improper use of parathyroid hormone.  Problems with the parathyroid glands or surgical removal of these glands.  Problems with parathyroid function after removal of the thyroid gland.  Lack (deficiency) of vitamin D or magnesium or both.  Kidney problems.  Less common causes include:  Intestinal problems that interfere with nutrient absorption.  Alcoholism.  Low levels of a  body protein that is called albumin.  Inflammation of the pancreas (pancreatitis).  Certain medicines.  Severe infections (sepsis).  Certain diseases, such as sarcoidosis or hemochromatosis, that cause the parathyroid glands to be filled with cells or substances that are not normally present.  Breakdown of large amounts of muscle fiber.  High levels of phosphate in the body.  Cancer.  Massive blood transfusions, which usually occur with severe trauma.  What are the signs or symptoms? Symptoms of this condition include:  Numbness and tingling in the fingers, toes, or around the mouth.  Muscle aches or cramps, especially in the legs, feet, and back.  Muscle twitches.  Abdominal cramping or pain.  Memory problems, confusion, or difficulty thinking.  Depression, anxiety, irritability, or changes in personality.  Fainting.  Chest pain.  Difficulty swallowing.  Changes in the sound of the voice.  Shortness of breath or wheezing.  General weakness and fatigue.  Symptoms of severe hypocalcemia include:  Shaking uncontrollably (seizures).  Seizure of the voice box (laryngospasm).  Fast heartbeats (palpitations) and abnormal heart rhythms (arrhythmias).  Long-term symptoms of this condition include:  Coarse, brittle hair and nails.  Dry skin or lasting (chronic) skin diseases (psoriasis, eczema,, or dermatitis).  Clouding of the eye lens (cataracts).  How is this diagnosed? This condition is usually diagnosed with a blood test. You may also have other tests to help determine the underlying cause of the condition. For example, a test may be done that records the electrical activity of the heart (electrocardiogram,or ECG). How is this treated? Treatment for this condition may include:  Calcium given by mouth (orally) or given  through an IV tube that is inserted into one of your veins. The method used for giving calcium will depend on the severity of the  condition.  Other minerals (electrolytes), such as magnesium.  Other treatment will depend on the cause of the condition. Follow these instructions at home:  Follow diet instructions from your health care provider or dietitian.  Take supplements only as told by your health care provider.  Keep all follow-up visits as told by your health care provider. This is important. Contact a health care provider if:  You have increased fatigue.  You have increased muscle twitching.  You have new swelling in the feet, ankles, or legs.  You develop changes in mood, memory, or personality. Get help right away if:  You have chest pain.  You have persistent rapid or irregular heartbeats.  You have difficulty breathing.  You faint.  You start to have seizures.  You have confusion. This information is not intended to replace advice given to you by your health care provider. Make sure you discuss any questions you have with your health care provider. Document Released: 09/27/2009 Document Revised: 09/15/2015 Document Reviewed: 08/25/2014 Elsevier Interactive Patient Education  2018 [place].  Calcium; Vitamin D chewable tablet What is this medicine? CALCIUM; VITAMIN D (KAL see um; VYE ta min D) is a vitamin supplement. It is used to prevent conditions of low calcium and vitamin D. This medicine may be used for other purposes; ask your health care provider or pharmacist if you have questions. COMMON BRAND NAME(S): Calcet Citrate Soft Chew, Caltrate 600+D, Citracal Calcium, Citracal Creamy Bites, Os-Cal 500 Plus D, OSCAL with Vitamin D3, Oysco 500 + D What should I tell my health care provider before I take this medicine? They need to know if you have any of these conditions: -constipation -dehydration -heart disease -high level of calcium or vitamin D in the blood -high level of phosphate in the blood -kidney disease -kidney stones -liver disease -parathyroid  disease -sarcoidosis -stomach ulcer or obstruction -an unusual or allergic reaction to calcium, vitamin D, tartrazine dye, other medicines, foods, dyes, or preservatives -pregnant or trying to get pregnant -breast-feeding How should I use this medicine? Take this medicine by mouth. Chew it completely before swallowing. Follow the directions on the prescription label. Take with food or within 1 hour after a meal. Take your medicine at regular intervals. Do not take your medicine more often than directed. Talk to your pediatrician regarding the use of this medicine in children. While this medicine may be used in children for selected conditions, precautions do apply. Overdosage: If you think you have taken too much of this medicine contact a poison control center or emergency room at once. NOTE: This medicine is only for you. Do not share this medicine with others. What if I miss a dose? If you miss a dose, take it as soon as you can. If it is almost time for your next dose, take only that dose. Do not take double or extra doses. What may interact with this medicine? Do not take this medicine with any of the following medications: -ammonium chloride -methenamine This medicine may also interact with the following medications: -antibiotics like ciprofloxacin, gatifloxacin, tetracycline -captopril -delavirdine -diuretics -gabapentin -iron supplements -medicines for fungal infections like ketoconazole and itraconazole -medicines for seizures like ethotoin and phenytoin -mineral oil -mycophenolate -other vitamins with calcium, vitamin D, or minerals -quinidine -rosuvastatin -sucralfate -thyroid medicine This list may not describe all possible interactions. Give your health  care provider a list of all the medicines, herbs, non-prescription drugs, or dietary supplements you use. Also tell them if you smoke, drink alcohol, or use illegal drugs. Some items may interact with your medicine. What  should I watch for while using this medicine? Taking this medicine is not a substitute for a well-balanced diet and exercise. Talk with your doctor or health care provider and follow a healthy lifestyle. Do not take this medicine with high-fiber foods, large amounts of alcohol, or drinks containing caffeine. Do not take this medicine within 2 hours of any other medicines. What side effects may I notice from receiving this medicine? Side effects that you should report to your doctor or health care professional as soon as possible: -allergic reactions like skin rash, itching or hives, swelling of the face, lips, or tongue -confusion -dry mouth -high blood pressure -increased hunger or thirst -increased urination -irregular heartbeat -metallic taste -muscle or bone pain -pain when urinating -seizure -unusually weak or tired -weight loss Side effects that usually do not require medical attention (report to your doctor or health care professional if they continue or are bothersome): -constipation -diarrhea -headache -loss of appetite -nausea, vomiting -stomach upset This list may not describe all possible side effects. Call your doctor for medical advice about side effects. You may report side effects to FDA at 1-800-FDA-1088. Where should I keep my medicine? Keep out of the reach of children. Store at room temperature between 15 and 30 degrees C (59 and 86 degrees F). Protect from light. Keep container tightly closed. Throw away any unused medicine after the expiration date. NOTE: This sheet is a summary. It may not cover all possible information. If you have questions about this medicine, talk to your doctor, pharmacist, or health care provider.  2018 Elsevier/Gold Standard (2007-07-23 17:57:27)   Calcium Intake Recommendations Calcium is a mineral that affects many functions in the body, including:  Blood clotting.  Blood vessel function.  Nerve impulse conduction.  Hormone  secretion.  Muscle contraction.  Bone and teeth functions.  Most of your body's calcium supply is stored in your bones and teeth. When your calcium stores are low, you may be at risk for low bone mass, bone loss, and bone fractures. Consuming enough calcium helps to grow healthy bones and teeth and to prevent breakdown over time. It is very important that you get enough calcium if you are:  A child undergoing rapid growth.  An adolescent girl.  A pre- or post-menopausal woman.  A woman whose menstrual cycle has stopped due to anorexia nervosa or regular intense exercise.  An individual with lactose intolerance or a milk allergy.  A vegetarian.  What is my plan? Try to consume the recommended amount of calcium daily based on your age. Depending on your overall health, your health care provider may recommend increased calcium intake.General daily calcium intake recommendations by age are:  Birth to 6 months: 200 mg.  Infants 7 to 12 months: 260 mg.  Children 1 to 3 years: 700 mg.  Children 4 to 8 years: 1,000 mg.  Children 9 to 13 years: 1,300 mg.  Teens 14 to 18 years: 1,300 mg.  Adults 19 to 50 years: 1,000 mg.  Adult women 51 to 70 years: 1,200 mg.  Adult men 51 to 70 years: 1,000 mg.  Adults 71 years and older: 1,200 mg.  Pregnant and breastfeeding teens: 1,300 mg.  Pregnant and breastfeeding adults: 1,000 mg.  What do I need to know about calcium  intake?  In order for the body to absorb calcium, it needs vitamin D. You can get vitamin D through: ? Direct exposure of the skin to sunlight. ? Foods, such as egg yolks, liver, saltwater fish, and fortified milk. ? Supplements.  Consuming too much calcium may cause: ? Constipation. ? Decreased absorption of iron and zinc. ? Kidney stones.  Calcium supplements may interact with certain medicines. Check with your health care provider before starting any calcium supplements.  Try to get most of your calcium  from food. What foods can I eat? Grains  Fortified oatmeal. Fortified ready-to-eat cereals. Fortified frozen waffles. Vegetables Turnip greens. Broccoli. Fruits Fortified orange juice. Meats and Other Protein Sources Canned sardines with bones. Canned salmon with bones. Soy beans. Tofu. Baked beans. Almonds. [place] nuts. Sunflower seeds. Dairy Milk. Yogurt. Cheese. Cottage cheese. Beverages Fortified soy milk. Fortified rice milk. Sweets/Desserts Pudding. Ice Cream. Milkshakes. Blackstrap molasses. The items listed above may not be a complete list of recommended foods or beverages. Contact your dietitian for more options. What foods can affect my calcium intake? It may be more difficult for your body to use calcium or calcium may leave your body more quickly if you consume large amounts of:  Sodium.  Protein.  Caffeine.  Alcohol.  This information is not intended to replace advice given to you by your health care provider. Make sure you discuss any questions you have with your health care provider. Document Released: 11/22/2003 Document Revised: 10/28/2015 Document Reviewed: 09/15/2013 Elsevier Interactive Patient Education  2018 [employer].

## 2017-07-30 NOTE — Progress Notes (Signed)
Patient Care Team: Lucianne Lei, MD as PCP - General (Family Medicine) [name], MD as Consulting Physician (General Surgery) [name], MD as Consulting Physician (Hematology and Oncology) [name], MD as Attending Physician (Radiation Oncology) Benson Norway, RN as Registered Nurse (Oncology) Sylvan Cheese, NP as Nurse Practitioner (Hematology and Oncology)  DIAGNOSIS:  Encounter Diagnosis  Name Primary?  . Malignant neoplasm of upper-inner quadrant of right breast in female, estrogen receptor positive ([place])     SUMMARY OF ONCOLOGIC HISTORY:   Breast cancer of upper-inner quadrant of right female breast ([place])   01/12/2014 Mammogram    Right breast - two lesions: #1 2:00 position 1.6 x 1.9 cm and #2 12:00 position 0.5 x 0.9 cm. Distance between the 2 was 4.5 cm, right axillary lymph node enlargement      01/12/2014 Initial Biopsy    Right breast 3 biopsies: All were IDC with DCIS ER+ PR + Ki-67 85% HER-2 negative: Right axillary lymph node also positive for metastatic cancer ER positive HER-2 negative Ki-67 80% grade 2      01/18/2014 Breast MRI    Right breast 12:00 position 2.8 x 2 x 2.2 cm: 2:00 position 1.3 x 0.8 x 0.5 cm, right axilla multiple enlarged lymph nodes 1 cm in size, right retropectoral lymph node 0.6 cm      01/18/2014 Clinical Stage    Stage IIB T2 N1      01/25/2014 Procedure    Breast/Ovarian ([facility]) reveals no clinically significant variant at ATM, BARD1, BRCA1, BRCA2, BRIP1, CDH1, CHEK2, EPCAM, FANCC, MLH1, MSH2, MSH6, NBN, PALB2, PMS2, PTEN, RAD51C, RAD51D, TP53, and XRCC2.       02/11/2014 -  Neo-Adjuvant Chemotherapy    Doxorubicin and cyclophosphamide X 4 followed by Taxol weekly x12       07/12/2014 Breast MRI    Partial response to neoadjuvant chemotherapy right breast mass decreased from 2.8 cm to 2.3 cm, 2 small masses along the medial margin also decreased; significant decrease right breast mass 11 to 12:00 and 13 mm  to 8 mm, decrease in right axilla LN      08/23/2014 Definitive Surgery    Right lumpectomy/SLNB [name]) 2:00: IDC grade 3, 3.2 cm, high-grade DCIS, LV I present, PNI present,; 12:00: IDC grade 30.8 cm, high-grade DCIS, LV I, 4/4 lymph nodes positive with ECE, ER 90%, PR 40%, HER-2 negative margin positive      08/23/2014 Pathologic Stage    Stage IIIA: ypT2 ypN2a      09/15/2014 Surgery    Reexcision of margins: clear; no evidence of malignancy      11/03/2014 - 12/14/2014 Radiation Therapy    Adjuvant XRT Isidore Moos): Right Breast, supraclavicular nodes, axillary nodes, and internal mammary nodes: 50 Gy over 25 fractions; right breast boost: 10 Gy over 5 fractions. Total dose: 60 Gy      12/29/2014 -  Anti-estrogen oral therapy    Tamoxifen 20 mg daily      04/08/2015 Survivorship    Survivorship care plan completed and mailed to patient in lieu of in person visit at her request      12/31/2015 Relapse/Recurrence    Innumerable osseous metastases with acute pathologic fracture the L4 vertebral body. 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 L5-S1 protrusion with left more than right S1 impingement      01/05/2016 - 01/12/2016 Radiation Therapy    Palliative radiation to the spine for cord compression      01/13/2016 -  Anti-estrogen oral therapy    Ribociclib 3 weeks on and 1 week off and Letrozole (with Zolodex until oophorectomy) and Xgeva for bone mets      06/17/2017 Pathology Results    Liver biopsy: Metastatic carcinoma: positive for cytokeratin 7, GCDFP, and GATA-3 but negative for cytokeratin 20. The immunoprofile is consistent with metastasis from the patient's known breast carcinoma.  ER 0%, PR 0%, Ki-67 20%, HER-2 negative ratio 1.23      07/02/2017 -  Chemotherapy    Halaven day 1 day 8 every 3 weeks       CHIEF COMPLIANT: Cycle 2-day 8 Halaven  INTERVAL HISTORY: Debbie Martinez is a 42 year old with above-mentioned history metastatic  breast cancer is currently on palliative chemotherapy with Halaven.  Today is cycle 2-day 8.  She is tolerating chemo extremely well.  She does have fatigue.  She also has nausea but denies any vomiting.  She takes antinausea medication which appears to be helping her.  Denies any neuropathy.  REVIEW OF SYSTEMS:   Constitutional: Denies fevers, chills or abnormal weight loss Eyes: Denies blurriness of vision Ears, nose, mouth, throat, and face: Denies mucositis or sore throat Respiratory: Denies cough, dyspnea or wheezes Cardiovascular: Denies palpitation, chest discomfort Gastrointestinal: Nausea after each chemotherapy Skin: Denies abnormal skin rashes Lymphatics: Denies new lymphadenopathy or easy bruising Neurological: Lower extremity weakness and some numbness Behavioral/Psych: Mood is stable, no new changes  Extremities: No lower extremity edema All other systems were reviewed with the patient and are negative.  I have reviewed the past medical history, past surgical history, social history and family history with the patient and they are unchanged from previous note.  ALLERGIES:  is allergic to lisinopril and tamoxifen.  MEDICATIONS:  Current Outpatient Medications  Medication Sig Dispense Refill  . ACCU-CHEK AVIVA PLUS test strip USE AS DIRECTED 4 TIMES A DAY 1 each 12  . ACCU-CHEK SOFTCLIX LANCETS lancets 100 each by Other route 4 (four) times daily. Use as instructed 100 each 0  . atenolol (TENORMIN) 50 MG tablet Take 50 mg by mouth daily.    . blood glucose meter kit and supplies KIT Dispense based on patient and insurance preference. Use up to four times daily as directed. (FOR ICD-9 250.00, 250.01). 1 each 0  . Cholecalciferol (VITAMIN D-3 PO) Take 1 capsule by mouth daily.    . diazepam (VALIUM) 5 MG tablet Take 1 tablet (5 mg total) by mouth 2 (two) times daily. (Patient not taking: Reported on 03/14/2016) 10 tablet 0  . ENSURE (ENSURE) Take 237 mLs by mouth 2 (two) times  daily between meals.     . gabapentin (NEURONTIN) 300 MG capsule Take 1 capsule (300 mg total) by mouth 3 (three) times daily. 90 capsule 5  . Iron-Vitamins (GERITOL) LIQD Take 5 mLs by mouth daily.    Marland Kitchen lidocaine-prilocaine (EMLA) cream Apply to affected area once 30 g 3  . Lidocaine-Prilocaine, Bulk, 2.5-2.5 % CREA Apply 5 mLs topically as needed. 2 hours before use, cover with plawtic wrap. 30 g 2  . Magnesium Salicylate (DOANS PILLS PO) Take 2 tablets by mouth daily as needed (for pain).    . metFORMIN (GLUCOPHAGE) 500 MG tablet Take 1 tablet (500 mg total) by mouth 2 (two) times daily with a meal. (Patient not taking: Reported on 05/30/2016) 60 tablet 0  . methocarbamol (ROBAXIN) 500 MG tablet Take 1 tablet (500 mg total) by mouth 2 (two) times daily as needed for muscle spasms. (Patient taking  differently: Take 1,000 mg by mouth 4 (four) times daily as needed for muscle spasms. ) 20 tablet 0  . omeprazole (PRILOSEC) 20 MG capsule Take 1 capsule (20 mg total) by mouth daily. Take while using dexamethasone to prevent heartburn. 30 capsule 2  . ondansetron (ZOFRAN) 8 MG tablet Take 1 tablet (8 mg total) by mouth 2 (two) times daily as needed (Nausea or vomiting). 30 tablet 1  . oxyCODONE-acetaminophen (PERCOCET/ROXICET) 5-325 MG tablet Take 1 tablet by mouth every 6 (six) hours as needed for severe pain. 120 tablet 0  . polyethylene glycol (MIRALAX / GLYCOLAX) packet Take 17 g by mouth daily. 14 each 0  . prochlorperazine (COMPAZINE) 10 MG tablet Take 1 tablet (10 mg total) by mouth every 6 (six) hours as needed (Nausea or vomiting). 30 tablet 1  . traMADol (ULTRAM) 50 MG tablet Take 50-100 mg by mouth every 6 (six) hours as needed (for pain).    . valsartan (DIOVAN) 80 MG tablet Take 1 tablet (80 mg total) by mouth daily. 90 tablet 3   No current facility-administered medications for this visit.     PHYSICAL EXAMINATION: ECOG PERFORMANCE STATUS: 1 - Symptomatic but completely  ambulatory  Vitals:   07/30/17 1046  BP: (!) 134/99  Pulse: 95  Resp: 18  Temp: 98 F (36.7 C)  SpO2: 99%   Filed Weights   07/30/17 1046  Weight: 174 lb 6.4 oz (79.1 kg)    GENERAL:alert, no distress and comfortable SKIN: skin color, texture, turgor are normal, no rashes or significant lesions EYES: normal, Conjunctiva are pink and non-injected, sclera clear OROPHARYNX:no exudate, no erythema and lips, buccal mucosa, and tongue normal  NECK: supple, thyroid normal size, non-tender, without nodularity LYMPH:  no palpable lymphadenopathy in the cervical, axillary or inguinal LUNGS: clear to auscultation and percussion with normal breathing effort HEART: regular rate & rhythm and no murmurs and no lower extremity edema ABDOMEN:abdomen soft, non-tender and normal bowel sounds MUSCULOSKELETAL:no cyanosis of digits and no clubbing  NEURO: alert & oriented x 3 with fluent speech, no focal motor/sensory deficits EXTREMITIES: No lower extremity edema  LABORATORY DATA:  I have reviewed the data as listed CMP Latest Ref Rng & Units 07/23/2017 07/09/2017 07/02/2017  Glucose 70 - 140 mg/dL 236(H) 159(H) 147(H)  BUN 7 - 26 mg/dL 6(L) 7 10  Creatinine 0.60 - 1.10 mg/dL 0.71 0.69 0.71  Sodium 136 - 145 mmol/L 139 139 139  Potassium 3.5 - 5.1 mmol/L 3.8 3.8 3.9  Chloride 98 - 109 mmol/L 106 106 105  CO2 22 - 29 mmol/L _0 Calcium 8.4 - 10.4 mg/dL 8.9 8.8 9.8  Total Protein 6.4 - 8.3 g/dL 7.4 7.2 7.7  Total Bilirubin 0.2 - 1.2 mg/dL 0.3 0.3 0.3  Alkaline Phos 40 - 150 U/L 114 85 85  AST 5 - 34 U/L 24 58(H) 25  ALT 0 - 55 U/L 40 100(H) 30    Lab Results  Component Value Date   WBC 5.2 07/30/2017   HGB 8.6 (L) 07/09/2017   HCT 27.1 (L) 07/30/2017   MCV 89.7 07/30/2017   PLT 281 07/30/2017   NEUTROABS 4.0 07/30/2017    ASSESSMENT & PLAN:  Breast cancer of upper-inner quadrant of right female breast ([facility]) Right breast invasive ductal carcinoma ER/PR positive HER-2 negative  multifocal disease T2 N1 stage IIB clinical stage grade 2 with biopsy-proven axillary lymph node metastases.   Treatment summary:  1. Patient completed neoadjuvant dose dense Adriamycin and  Cytoxan and weekly Taxol X 12 started 02/09/2014 and completed 07/01/14 2. Right double lumpectomy 2:00: IDC grade 3, 3.2 cm, high-grade DCIS, LV I present, PNI present,; 12:00: IDC grade 3; 0.8 cm, high-grade DCIS, LVI, 4/4 lymph nodes positive with ECE, ER 90%, PR 40%, HER-2 negative margin positive T2 N2 M0 stage IIIa 3. Patient is on [facility] clinical trial and she was randomized intraoperatively for NO lymph node dissection. 4.Tamoxifen 20 mg daily started 12/29/2014 stopped July 2016 for adverse effects 5. 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 Palliative XRT to the spine for cord compression 8.Ribociclibwith letrozole plus ovarian suppression started 01/13/2016-06/25/2017 ---------------------------------------------------------------------------------------------------------------------------------------------------------  CT chest abdomen pelvis: There are over 20 masses scattered throughout the liver  Liver biopsy 2/25/2019Metastatic carcinoma: positive for cytokeratin 7, GCDFP, and GATA-3 but negative for cytokeratin 20. The immunoprofile is consistent with metastasis from the patient's known breast carcinoma. ER 0%, PR 0%, Ki-67 20%, HER-2 negative ratio 1.23  Current treatmentplan:Halaven days 1 and8every 3 weeks,today is cycle 2 day 8 Bone Mets: Xgeva with calcium and vitamin D Chemo toxicities: 1. Nausea grade 1 2. fatigue  Return to clinic in2weeks for cycle 3 day 1ofHalaven Our plan is to perform scans prior to cycle 4.   No orders of the defined types were placed in this encounter.  The patient has a good understanding of the overall plan. she agrees  with it. she will call with any problems that may develop before the next visit here.   Harriette Ohara, MD 07/30/17

## 2017-08-05 DIAGNOSIS — J209 Acute bronchitis, unspecified: Secondary | ICD-10-CM | POA: Diagnosis not present

## 2017-08-05 DIAGNOSIS — I10 Essential (primary) hypertension: Secondary | ICD-10-CM | POA: Diagnosis not present

## 2017-08-05 DIAGNOSIS — J301 Allergic rhinitis due to pollen: Secondary | ICD-10-CM | POA: Diagnosis not present

## 2017-08-13 ENCOUNTER — Inpatient Hospital Stay: Payer: Medicare Other

## 2017-08-13 VITALS — BP 121/84 | HR 97 | Temp 98.9°F | Resp 18

## 2017-08-13 DIAGNOSIS — C50919 Malignant neoplasm of unspecified site of unspecified female breast: Secondary | ICD-10-CM

## 2017-08-13 DIAGNOSIS — Z170 Estrogen receptor positive status [ER+]: Secondary | ICD-10-CM | POA: Diagnosis not present

## 2017-08-13 DIAGNOSIS — C50211 Malignant neoplasm of upper-inner quadrant of right female breast: Secondary | ICD-10-CM | POA: Diagnosis not present

## 2017-08-13 DIAGNOSIS — R110 Nausea: Secondary | ICD-10-CM | POA: Diagnosis not present

## 2017-08-13 DIAGNOSIS — R252 Cramp and spasm: Secondary | ICD-10-CM | POA: Diagnosis not present

## 2017-08-13 DIAGNOSIS — C787 Secondary malignant neoplasm of liver and intrahepatic bile duct: Secondary | ICD-10-CM | POA: Diagnosis not present

## 2017-08-13 DIAGNOSIS — R5383 Other fatigue: Secondary | ICD-10-CM | POA: Diagnosis not present

## 2017-08-13 DIAGNOSIS — C7951 Secondary malignant neoplasm of bone: Secondary | ICD-10-CM | POA: Diagnosis not present

## 2017-08-13 DIAGNOSIS — Z5111 Encounter for antineoplastic chemotherapy: Secondary | ICD-10-CM | POA: Diagnosis not present

## 2017-08-13 LAB — COMPREHENSIVE METABOLIC PANEL
ALBUMIN: 3.6 g/dL (ref 3.5–5.0)
ALK PHOS: 106 U/L (ref 40–150)
ALT: 22 U/L (ref 0–55)
ANION GAP: 10 (ref 3–11)
AST: 23 U/L (ref 5–34)
BILIRUBIN TOTAL: 0.3 mg/dL (ref 0.2–1.2)
BUN: 8 mg/dL (ref 7–26)
CALCIUM: 9.9 mg/dL (ref 8.4–10.4)
CO2: 23 mmol/L (ref 22–29)
Chloride: 104 mmol/L (ref 98–109)
Creatinine, Ser: 0.67 mg/dL (ref 0.60–1.10)
GFR calc Af Amer: >60 mL/min (ref >60)
GFR calc non Af Amer: >60 mL/min (ref >60)
GLUCOSE: 169 mg/dL — AB (ref 70–140)
Potassium: 4.1 mmol/L (ref 3.5–5.1)
Sodium: 137 mmol/L (ref 136–145)
TOTAL PROTEIN: 8 g/dL (ref 6.4–8.3)

## 2017-08-13 LAB — CBC WITH DIFFERENTIAL/PLATELET
BASOS ABS: 0 10*3/uL (ref 0.0–0.1)
Basophils Relative: 0 %
EOS PCT: 0 %
Eosinophils Absolute: 0 10*3/uL (ref 0.0–0.5)
HCT: 28.8 % — ABNORMAL LOW (ref 34.8–46.6)
Hemoglobin: 8.5 g/dL — ABNORMAL LOW (ref 11.6–15.9)
Lymphocytes Relative: 12 %
Lymphs Abs: 0.8 10*3/uL — ABNORMAL LOW (ref 0.9–3.3)
MCH: 25.5 pg (ref 25.1–34.0)
MCHC: 29.5 g/dL — ABNORMAL LOW (ref 31.5–36.0)
MCV: 86.5 fL (ref 79.5–101.0)
MONO ABS: 0.7 10*3/uL (ref 0.1–0.9)
Monocytes Relative: 11 %
Neutro Abs: 5.3 10*3/uL (ref 1.5–6.5)
Neutrophils Relative %: 77 %
Platelets: 288 10*3/uL (ref 145–400)
RBC: 3.33 MIL/uL — ABNORMAL LOW (ref 3.70–5.45)
RDW: 18.5 % — AB (ref 11.2–14.5)
WBC: 6.9 10*3/uL (ref 3.9–10.3)

## 2017-08-13 MED ORDER — SODIUM CHLORIDE 0.9 % IV SOLN
1.4000 mg/m2 | Freq: Once | INTRAVENOUS | Status: AC
  Administered 2017-08-13: 2.7 mg via INTRAVENOUS
  Filled 2017-08-13: qty 5.4

## 2017-08-13 MED ORDER — PROCHLORPERAZINE MALEATE 10 MG PO TABS
10.0000 mg | ORAL_TABLET | Freq: Once | ORAL | Status: AC
  Administered 2017-08-13: 10 mg via ORAL

## 2017-08-13 MED ORDER — SODIUM CHLORIDE 0.9% FLUSH
10.0000 mL | INTRAVENOUS | Status: DC | PRN
  Administered 2017-08-13: 10 mL
  Filled 2017-08-13: qty 10

## 2017-08-13 MED ORDER — SODIUM CHLORIDE 0.9 % IV SOLN
Freq: Once | INTRAVENOUS | Status: AC
  Administered 2017-08-13: 13:00:00 via INTRAVENOUS

## 2017-08-13 MED ORDER — HEPARIN SOD (PORK) LOCK FLUSH 100 UNIT/ML IV SOLN
500.0000 [IU] | Freq: Once | INTRAVENOUS | Status: AC | PRN
  Administered 2017-08-13: 500 [IU]
  Filled 2017-08-13: qty 5

## 2017-08-13 MED ORDER — PROCHLORPERAZINE MALEATE 10 MG PO TABS
ORAL_TABLET | ORAL | Status: AC
  Filled 2017-08-13: qty 1

## 2017-08-13 NOTE — Patient Instructions (Signed)
[facility] Discharge Instructions for Patients Receiving Chemotherapy  Today you received the following chemotherapy agents [place] To help prevent nausea and vomiting after your treatment, we encourage you to take your nausea medication as prescribed.   If you develop nausea and vomiting that is not controlled by your nausea medication, call the clinic.   BELOW ARE SYMPTOMS THAT SHOULD BE REPORTED IMMEDIATELY:  *FEVER GREATER THAN 100.5 F  *CHILLS WITH OR WITHOUT FEVER  NAUSEA AND VOMITING THAT IS NOT CONTROLLED WITH YOUR NAUSEA MEDICATION  *UNUSUAL SHORTNESS OF BREATH  *UNUSUAL BRUISING OR BLEEDING  TENDERNESS IN MOUTH AND THROAT WITH OR WITHOUT PRESENCE OF ULCERS  *URINARY PROBLEMS  *BOWEL PROBLEMS  UNUSUAL RASH Items with * indicate a potential emergency and should be followed up as soon as possible.  Feel free to call the clinic should you have any questions or concerns. The clinic phone number is [phone].  Please show the CHEMO ALERT CARD at check-in to the Emergency Department and triage nurse.   

## 2017-08-14 ENCOUNTER — Telehealth: Payer: Self-pay

## 2017-08-14 ENCOUNTER — Telehealth: Payer: Self-pay | Admitting: Medical Oncology

## 2017-08-14 NOTE — Telephone Encounter (Signed)
Called and spoke with pt

## 2017-08-14 NOTE — Telephone Encounter (Signed)
Returned pt call. halavan yesterday " I feel like I have the flu. I can hardly walk and am in pain all over . Is there anything I can take?"  She took Gapabentin, percocet and tylenol. She said she did have a fever of 102 F. She said she takes gaba twice a day. I told her she can take it 3 times /day per Dr Isidore Moos. She gave the phone to her daughter because " I am driving to pick up food". I told her I will have someone call her back.

## 2017-08-14 NOTE — Telephone Encounter (Signed)
Called pt to see how she is doing with her bone pain and fever. Pt states that she had been in severe bone pain even prior to starting her chemo yesterday. She is on oxycodone and gabapentin on the clock, but is having difficulty with her mobility due to pain. Pt states that she does not feel febrile today, but had a 169f deg fever last night. Pt is hydrating plenty of fluids and denies any other symptoms.   Suggested that pt try claritin 10mg  po daily for 1 week to see if it helps with bone pain. Pt received xgeva recently as well. Pt remembered that she was supposed to take claritin, per Dr.Gudena suggestion, to help with the muscle aches/ pain.   Advised that pt call us tomorrow if her condition worsen and if we need to set up symptom management appt for her. Pt verbalized understanding and will call with more questions.

## 2017-08-20 ENCOUNTER — Inpatient Hospital Stay: Payer: Medicare Other

## 2017-08-20 ENCOUNTER — Inpatient Hospital Stay (HOSPITAL_BASED_OUTPATIENT_CLINIC_OR_DEPARTMENT_OTHER): Payer: Medicare Other | Admitting: Hematology and Oncology

## 2017-08-20 VITALS — BP 139/99 | HR 102 | Temp 98.3°F | Resp 18 | Ht 67.0 in | Wt 167.6 lb

## 2017-08-20 DIAGNOSIS — R5383 Other fatigue: Secondary | ICD-10-CM | POA: Diagnosis not present

## 2017-08-20 DIAGNOSIS — Z170 Estrogen receptor positive status [ER+]: Secondary | ICD-10-CM

## 2017-08-20 DIAGNOSIS — C787 Secondary malignant neoplasm of liver and intrahepatic bile duct: Secondary | ICD-10-CM | POA: Diagnosis not present

## 2017-08-20 DIAGNOSIS — R110 Nausea: Secondary | ICD-10-CM | POA: Diagnosis not present

## 2017-08-20 DIAGNOSIS — C7951 Secondary malignant neoplasm of bone: Secondary | ICD-10-CM | POA: Diagnosis not present

## 2017-08-20 DIAGNOSIS — Z5111 Encounter for antineoplastic chemotherapy: Secondary | ICD-10-CM | POA: Diagnosis not present

## 2017-08-20 DIAGNOSIS — C50919 Malignant neoplasm of unspecified site of unspecified female breast: Secondary | ICD-10-CM

## 2017-08-20 DIAGNOSIS — Z95828 Presence of other vascular implants and grafts: Secondary | ICD-10-CM

## 2017-08-20 DIAGNOSIS — C50911 Malignant neoplasm of unspecified site of right female breast: Secondary | ICD-10-CM

## 2017-08-20 DIAGNOSIS — C50211 Malignant neoplasm of upper-inner quadrant of right female breast: Secondary | ICD-10-CM

## 2017-08-20 DIAGNOSIS — R252 Cramp and spasm: Secondary | ICD-10-CM | POA: Diagnosis not present

## 2017-08-20 LAB — CBC WITH DIFFERENTIAL (CANCER CENTER ONLY)
Basophils Absolute: 0 10*3/uL (ref 0.0–0.1)
Basophils Relative: 1 %
EOS ABS: 0 10*3/uL (ref 0.0–0.5)
Eosinophils Relative: 1 %
HCT: 26 % — ABNORMAL LOW (ref 34.8–46.6)
Hemoglobin: 8.2 g/dL — ABNORMAL LOW (ref 11.6–15.9)
LYMPHS ABS: 0.6 10*3/uL — AB (ref 0.9–3.3)
LYMPHS PCT: 11 %
MCH: 26 pg (ref 25.1–34.0)
MCHC: 31.6 g/dL (ref 31.5–36.0)
MCV: 82.2 fL (ref 79.5–101.0)
MONO ABS: 0.3 10*3/uL (ref 0.1–0.9)
MONOS PCT: 6 %
Neutro Abs: 4.4 10*3/uL (ref 1.5–6.5)
Neutrophils Relative %: 81 %
PLATELETS: 371 10*3/uL (ref 145–400)
RBC: 3.16 MIL/uL — AB (ref 3.70–5.45)
RDW: 21.5 % — AB (ref 11.2–14.5)
WBC: 5.5 10*3/uL (ref 3.9–10.3)

## 2017-08-20 LAB — CMP (CANCER CENTER ONLY)
ALT: 26 U/L (ref 0–55)
ANION GAP: 11 (ref 3–11)
AST: 21 U/L (ref 5–34)
Albumin: 3.5 g/dL (ref 3.5–5.0)
Alkaline Phosphatase: 94 U/L (ref 40–150)
BUN: 7 mg/dL (ref 7–26)
CO2: 24 mmol/L (ref 22–29)
CREATININE: 0.67 mg/dL (ref 0.60–1.10)
Calcium: 8.9 mg/dL (ref 8.4–10.4)
Chloride: 104 mmol/L (ref 98–109)
GFR, Est AFR Am: >60 mL/min (ref >60)
Glucose, Bld: 212 mg/dL — ABNORMAL HIGH (ref 70–140)
Potassium: 3.7 mmol/L (ref 3.5–5.1)
SODIUM: 139 mmol/L (ref 136–145)
Total Bilirubin: 0.3 mg/dL (ref 0.2–1.2)
Total Protein: 7.5 g/dL (ref 6.4–8.3)

## 2017-08-20 MED ORDER — SODIUM CHLORIDE 0.9 % IV SOLN
2.0000 mg | Freq: Once | INTRAVENOUS | Status: AC
  Administered 2017-08-20: 2 mg via INTRAVENOUS
  Filled 2017-08-20: qty 4

## 2017-08-20 MED ORDER — HEPARIN SOD (PORK) LOCK FLUSH 100 UNIT/ML IV SOLN
500.0000 [IU] | Freq: Once | INTRAVENOUS | Status: AC | PRN
  Administered 2017-08-20: 500 [IU]
  Filled 2017-08-20: qty 5

## 2017-08-20 MED ORDER — PROCHLORPERAZINE MALEATE 10 MG PO TABS
10.0000 mg | ORAL_TABLET | Freq: Once | ORAL | Status: AC
  Administered 2017-08-20: 10 mg via ORAL

## 2017-08-20 MED ORDER — SODIUM CHLORIDE 0.9 % IV SOLN
Freq: Once | INTRAVENOUS | Status: AC
  Administered 2017-08-20: 12:00:00 via INTRAVENOUS

## 2017-08-20 MED ORDER — SODIUM CHLORIDE 0.9% FLUSH
10.0000 mL | Freq: Once | INTRAVENOUS | Status: AC
  Administered 2017-08-20: 10 mL
  Filled 2017-08-20: qty 10

## 2017-08-20 MED ORDER — OXYCODONE-ACETAMINOPHEN 5-325 MG PO TABS: 1.0000 | ORAL_TABLET | Freq: Four times a day (QID) | ORAL | 0 refills | Status: DC | PRN

## 2017-08-20 MED ORDER — PROCHLORPERAZINE MALEATE 10 MG PO TABS
ORAL_TABLET | ORAL | Status: AC
  Filled 2017-08-20: qty 1

## 2017-08-20 MED ORDER — SODIUM CHLORIDE 0.9% FLUSH
10.0000 mL | INTRAVENOUS | Status: DC | PRN
  Administered 2017-08-20: 10 mL
  Filled 2017-08-20: qty 10

## 2017-08-20 NOTE — Patient Instructions (Signed)
[facility] Discharge Instructions for Patients Receiving Chemotherapy  Today you received the following chemotherapy agents [place]  To help prevent nausea and vomiting after your treatment, we encourage you to take your nausea medication as directed If you develop nausea and vomiting that is not controlled by your nausea medication, call the clinic.   BELOW ARE SYMPTOMS THAT SHOULD BE REPORTED IMMEDIATELY:  *FEVER GREATER THAN 100.5 F  *CHILLS WITH OR WITHOUT FEVER  NAUSEA AND VOMITING THAT IS NOT CONTROLLED WITH YOUR NAUSEA MEDICATION  *UNUSUAL SHORTNESS OF BREATH  *UNUSUAL BRUISING OR BLEEDING  TENDERNESS IN MOUTH AND THROAT WITH OR WITHOUT PRESENCE OF ULCERS  *URINARY PROBLEMS  *BOWEL PROBLEMS  UNUSUAL RASH Items with * indicate a potential emergency and should be followed up as soon as possible.  Feel free to call the clinic should you have any questions or concerns. The clinic phone number is [phone].  Please show the CHEMO ALERT CARD at check-in to the Emergency Department and triage nurse.   

## 2017-08-20 NOTE — Progress Notes (Signed)
Patient Care Team: Lucianne Lei, MD as PCP - General (Family Medicine) [name], MD as Consulting Physician (General Surgery) [name], MD as Consulting Physician (Hematology and Oncology) [name], MD as Attending Physician (Radiation Oncology) Benson Norway, RN as Registered Nurse (Oncology) Sylvan Cheese, NP as Nurse Practitioner (Hematology and Oncology)  DIAGNOSIS:  Encounter Diagnoses  Name Primary?  . Bone metastasis ([place]) Yes  . Malignant neoplasm of upper-inner quadrant of right breast in female, estrogen receptor positive ([place])     SUMMARY OF ONCOLOGIC HISTORY:   Breast cancer of upper-inner quadrant of right female breast ([place])   01/12/2014 Mammogram    Right breast - two lesions: #1 2:00 position 1.6 x 1.9 cm and #2 12:00 position 0.5 x 0.9 cm. Distance between the 2 was 4.5 cm, right axillary lymph node enlargement      01/12/2014 Initial Biopsy    Right breast 3 biopsies: All were IDC with DCIS ER+ PR + Ki-67 85% HER-2 negative: Right axillary lymph node also positive for metastatic cancer ER positive HER-2 negative Ki-67 80% grade 2      01/18/2014 Breast MRI    Right breast 12:00 position 2.8 x 2 x 2.2 cm: 2:00 position 1.3 x 0.8 x 0.5 cm, right axilla multiple enlarged lymph nodes 1 cm in size, right retropectoral lymph node 0.6 cm      01/18/2014 Clinical Stage    Stage IIB T2 N1      01/25/2014 Procedure    Breast/Ovarian ([facility]) reveals no clinically significant variant at ATM, BARD1, BRCA1, BRCA2, BRIP1, CDH1, CHEK2, EPCAM, FANCC, MLH1, MSH2, MSH6, NBN, PALB2, PMS2, PTEN, RAD51C, RAD51D, TP53, and XRCC2.       02/11/2014 -  Neo-Adjuvant Chemotherapy    Doxorubicin and cyclophosphamide X 4 followed by Taxol weekly x12       07/12/2014 Breast MRI    Partial response to neoadjuvant chemotherapy right breast mass decreased from 2.8 cm to 2.3 cm, 2 small masses along the medial margin also decreased; significant decrease right  breast mass 11 to 12:00 and 13 mm to 8 mm, decrease in right axilla LN      08/23/2014 Definitive Surgery    Right lumpectomy/SLNB [name]) 2:00: IDC grade 3, 3.2 cm, high-grade DCIS, LV I present, PNI present,; 12:00: IDC grade 30.8 cm, high-grade DCIS, LV I, 4/4 lymph nodes positive with ECE, ER 90%, PR 40%, HER-2 negative margin positive      08/23/2014 Pathologic Stage    Stage IIIA: ypT2 ypN2a      09/15/2014 Surgery    Reexcision of margins: clear; no evidence of malignancy      11/03/2014 - 12/14/2014 Radiation Therapy    Adjuvant XRT Isidore Moos): Right Breast, supraclavicular nodes, axillary nodes, and internal mammary nodes: 50 Gy over 25 fractions; right breast boost: 10 Gy over 5 fractions. Total dose: 60 Gy      12/29/2014 -  Anti-estrogen oral therapy    Tamoxifen 20 mg daily      04/08/2015 Survivorship    Survivorship care plan completed and mailed to patient in lieu of in person visit at her request      12/31/2015 Relapse/Recurrence    Innumerable osseous metastases with acute pathologic fracture the L4 vertebral body. 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 L5-S1 protrusion with left more than right S1 impingement      01/05/2016 - 01/12/2016 Radiation Therapy    Palliative radiation to the spine for cord compression  01/13/2016 -  Anti-estrogen oral therapy    Ribociclib 3 weeks on and 1 week off and Letrozole (with Zolodex until oophorectomy) and Xgeva for bone mets      06/17/2017 Pathology Results    Liver biopsy: Metastatic carcinoma: positive for cytokeratin 7, GCDFP, and GATA-3 but negative for cytokeratin 20. The immunoprofile is consistent with metastasis from the patient's known breast carcinoma.  ER 0%, PR 0%, Ki-67 20%, HER-2 negative ratio 1.23      07/02/2017 -  Chemotherapy    Halaven day 1 day 8 every 3 weeks       CHIEF COMPLIANT: Cycle 3-day 8 Halaven  INTERVAL HISTORY: Debbie Martinez is a 42 year old with  above-mentioned history of metastatic breast cancer was progressive multiple lines of therapy and is currently on chemotherapy with Halaven.  Initially she tolerated chemo fairly well.  After the last cycle she had severe bone pain for which she took oxycodone and gabapentin.  She also had a temperature of 102 degrees.  REVIEW OF SYSTEMS:   Constitutional: Denies fevers, chills or abnormal weight loss Eyes: Denies blurriness of vision Ears, nose, mouth, throat, and face: Denies mucositis or sore throat Respiratory: Denies cough, dyspnea or wheezes Cardiovascular: Denies palpitation, chest discomfort Gastrointestinal:  Denies nausea, heartburn or change in bowel habits Skin: Denies abnormal skin rashes Lymphatics: Denies new lymphadenopathy or easy bruising Neurological:Denies numbness, tingling or new weaknesses Behavioral/Psych: Mood is stable, no new changes  Extremities: No lower extremity edema  All other systems were reviewed with the patient and are negative.  I have reviewed the past medical history, past surgical history, social history and family history with the patient and they are unchanged from previous note.  ALLERGIES:  is allergic to lisinopril and tamoxifen.  MEDICATIONS:  Current Outpatient Medications  Medication Sig Dispense Refill  . ACCU-CHEK AVIVA PLUS test strip USE AS DIRECTED 4 TIMES A DAY 1 each 12  . ACCU-CHEK SOFTCLIX LANCETS lancets 100 each by Other route 4 (four) times daily. Use as instructed 100 each 0  . atenolol (TENORMIN) 50 MG tablet Take 50 mg by mouth daily.    . blood glucose meter kit and supplies KIT Dispense based on patient and insurance preference. Use up to four times daily as directed. (FOR ICD-9 250.00, 250.01). 1 each 0  . Cholecalciferol (VITAMIN D-3 PO) Take 1 capsule by mouth daily.    . diazepam (VALIUM) 5 MG tablet Take 1 tablet (5 mg total) by mouth 2 (two) times daily. (Patient not taking: Reported on 03/14/2016) 10 tablet 0  .  ENSURE (ENSURE) Take 237 mLs by mouth 2 (two) times daily between meals.     . gabapentin (NEURONTIN) 300 MG capsule Take 1 capsule (300 mg total) by mouth 3 (three) times daily. 90 capsule 5  . Iron-Vitamins (GERITOL) LIQD Take 5 mLs by mouth daily.    Marland Kitchen lidocaine-prilocaine (EMLA) cream Apply to affected area once 30 g 3  . Lidocaine-Prilocaine, Bulk, 2.5-2.5 % CREA Apply 5 mLs topically as needed. 2 hours before use, cover with plawtic wrap. 30 g 2  . Magnesium Salicylate (DOANS PILLS PO) Take 2 tablets by mouth daily as needed (for pain).    . metFORMIN (GLUCOPHAGE) 500 MG tablet Take 1 tablet (500 mg total) by mouth 2 (two) times daily with a meal. (Patient not taking: Reported on 05/30/2016) 60 tablet 0  . methocarbamol (ROBAXIN) 500 MG tablet Take 1 tablet (500 mg total) by mouth 2 (two) times daily as  needed for muscle spasms. (Patient taking differently: Take 1,000 mg by mouth 4 (four) times daily as needed for muscle spasms. ) 20 tablet 0  . omeprazole (PRILOSEC) 20 MG capsule Take 1 capsule (20 mg total) by mouth daily. Take while using dexamethasone to prevent heartburn. 30 capsule 2  . ondansetron (ZOFRAN) 8 MG tablet Take 1 tablet (8 mg total) by mouth 2 (two) times daily as needed (Nausea or vomiting). 30 tablet 1  . oxyCODONE-acetaminophen (PERCOCET/ROXICET) 5-325 MG tablet Take 1 tablet by mouth every 6 (six) hours as needed for severe pain. 60 tablet 0  . polyethylene glycol (MIRALAX / GLYCOLAX) packet Take 17 g by mouth daily. 14 each 0  . prochlorperazine (COMPAZINE) 10 MG tablet Take 1 tablet (10 mg total) by mouth every 6 (six) hours as needed (Nausea or vomiting). 30 tablet 1  . traMADol (ULTRAM) 50 MG tablet Take 50-100 mg by mouth every 6 (six) hours as needed (for pain).    . valsartan (DIOVAN) 80 MG tablet Take 1 tablet (80 mg total) by mouth daily. 90 tablet 3   No current facility-administered medications for this visit.    Facility-Administered Medications Ordered in  Other Visits  Medication Dose Route Frequency Provider Last Rate Last Dose  . sodium chloride flush (NS) 0.9 % injection 10 mL  10 mL Intracatheter PRN [name], MD   10 mL at 08/20/17 1303    PHYSICAL EXAMINATION: ECOG PERFORMANCE STATUS: 1 - Symptomatic but completely ambulatory  Vitals:   08/20/17 1102  BP: (!) 139/99  Pulse: (!) 102  Resp: 18  Temp: 98.3 F (36.8 C)  SpO2: 100%   Filed Weights   08/20/17 1102  Weight: 167 lb 9.6 oz (76 kg)    GENERAL:alert, no distress and comfortable SKIN: skin color, texture, turgor are normal, no rashes or significant lesions EYES: normal, Conjunctiva are pink and non-injected, sclera clear OROPHARYNX:no exudate, no erythema and lips, buccal mucosa, and tongue normal  NECK: supple, thyroid normal size, non-tender, without nodularity LYMPH:  no palpable lymphadenopathy in the cervical, axillary or inguinal LUNGS: clear to auscultation and percussion with normal breathing effort HEART: regular rate & rhythm and no murmurs and no lower extremity edema ABDOMEN:abdomen soft, non-tender and normal bowel sounds MUSCULOSKELETAL:no cyanosis of digits and no clubbing  NEURO: alert & oriented x 3 with fluent speech, no focal motor/sensory deficits EXTREMITIES: No lower extremity edema  LABORATORY DATA:  I have reviewed the data as listed CMP Latest Ref Rng & Units 08/20/2017 08/13/2017 07/30/2017  Glucose 70 - 140 mg/dL 212(H) 169(H) 176(H)  BUN 7 - 26 mg/dL _0 Creatinine 0.60 - 1.10 mg/dL 0.67 0.67 0.66  Sodium 136 - 145 mmol/L 139 137 140  Potassium 3.5 - 5.1 mmol/L 3.7 4.1 3.8  Chloride 98 - 109 mmol/L 104 104 107  CO2 22 - 29 mmol/L _1 Calcium 8.4 - 10.4 mg/dL 8.9 9.9 8.5  Total Protein 6.4 - 8.3 g/dL 7.5 8.0 7.2  Total Bilirubin 0.2 - 1.2 mg/dL 0.3 0.3 0.3  Alkaline Phos 40 - 150 U/L 94 106 98  AST 5 - 34 U/L _2 ALT 0 - 55 U/L 26 22 32    Lab Results  Component Value Date   WBC 5.5 08/20/2017   HGB 8.2 (L)  08/20/2017   HCT 26.0 (L) 08/20/2017   MCV 82.2 08/20/2017   PLT 371 08/20/2017   NEUTROABS 4.4 08/20/2017    ASSESSMENT &  PLAN:  Breast cancer of upper-inner quadrant of right female breast ([place]) 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   Treatment summary:  1. Patient completed neoadjuvant dose dense Adriamycin and Cytoxan and weekly Taxol X 12 started 02/09/2014 and completed 07/01/14 2. Right double lumpectomy 2:00: IDC grade 3, 3.2 cm, high-grade DCIS, LV I present, PNI present,; 12:00: IDC grade 3; 0.8 cm, high-grade DCIS, LVI, 4/4 lymph nodes positive with ECE, ER 90%, PR 40%, HER-2 negative margin positive T2 N2 M0 stage IIIa 3. Patient is on [facility] clinical trial and she was randomized intraoperatively for NO lymph node dissection. 4.Tamoxifen 20 mg daily started 12/29/2014 stopped July 2016 for adverse effects 5. 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 Palliative XRT to the spine for cord compression 8.Ribociclibwith letrozole plus ovarian suppression started 01/13/2016-06/25/2017 ---------------------------------------------------------------------------------------------------------------------------------------------------------  CT chest abdomen pelvis: There are over 20 masses scattered throughout the liver  Liver biopsy 2/25/2019Metastatic carcinoma: positive for cytokeratin 7, GCDFP, and GATA-3 but negative for cytokeratin 20. The immunoprofile is consistent with metastasis from the patient's known breast carcinoma. ER 0%, PR 0%, Ki-67 20%, HER-2 negative ratio 1.23  Current treatmentplan:Halaven days 1 and8every 3 weeks,today is cycle 3 day 8 Bone Mets: Xgeva with calcium and vitamin D Chemo  toxicities: 1.Nausea grade 1 2.fatigue 3.  Intractable pain issues: I gave her a new prescription for Percocet today.  Our plan is to perform scans prior to cycle 4. Return to clinic in 2 weeks to discuss the scan results and for cycle 4 of chemotherapy.      Orders Placed This Encounter  Procedures  . CT Abdomen Pelvis Wo Contrast    Standing Status:   Future    Standing Expiration Date:   08/20/2018    Order Specific Question:   Is patient pregnant?    Answer:   No    Order Specific Question:   Preferred imaging location?    Answer:   [facility]    Order Specific Question:   Is Oral Contrast requested for this exam?    Answer:   Per Radiology protocol    Order Specific Question:   Radiology Contrast Protocol - do NOT remove file path    Answer:   [email or web address]    Order Specific Question:   Reason for Exam additional comments    Answer:   Metastatic breast cancer restaging on chemo  . CT Chest W Contrast    Standing Status:   Future    Standing Expiration Date:   08/20/2018    Order Specific Question:   If indicated for the ordered procedure, I authorize the administration of contrast media per Radiology protocol    Answer:   Yes    Order Specific Question:   Is patient pregnant?    Answer:   No    Order Specific Question:   Preferred imaging location?    Answer:   [facility]    Order Specific Question:   Radiology Contrast Protocol - do NOT remove file path    Answer:   [email or web address]    Order Specific Question:   Reason for Exam additional comments    Answer:   Metastatic breast cancer restaging on chemo   The patient has a good understanding of the overall plan. she agrees with it. she will call with  any problems that may develop before the next visit here.   Harriette Ohara, MD 08/20/17

## 2017-08-20 NOTE — Assessment & Plan Note (Signed)
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   Treatment summary:  1. Patient completed neoadjuvant dose dense Adriamycin and Cytoxan and weekly Taxol X 12 started 02/09/2014 and completed 07/01/14 2. Right double lumpectomy 2:00: IDC grade 3, 3.2 cm, high-grade DCIS, LV I present, PNI present,; 12:00: IDC grade 3; 0.8 cm, high-grade DCIS, LVI, 4/4 lymph nodes positive with ECE, ER 90%, PR 40%, HER-2 negative margin positive T2 N2 M0 stage IIIa 3. Patient is on [employer] clinical trial and she was randomized intraoperatively for NO lymph node dissection. 4.Tamoxifen 20 mg daily started 12/29/2014 stopped July 2016 for adverse effects 5. 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 Palliative XRT to the spine for cord compression 8.Ribociclibwith letrozole plus ovarian suppression started 01/13/2016-06/25/2017 ---------------------------------------------------------------------------------------------------------------------------------------------------------  CT chest abdomen pelvis: There are over 20 masses scattered throughout the liver  Liver biopsy 2/25/2019Metastatic carcinoma: positive for cytokeratin 7, GCDFP, and GATA-3 but negative for cytokeratin 20. The immunoprofile is consistent with metastasis from the patient's known breast carcinoma. ER 0%, PR 0%, Ki-67 20%, HER-2 negative ratio 1.23  Current treatmentplan:Halaven days 1 and8every 3 weeks,today is cycle 3 day 8 Bone Mets: Xgeva with calcium and vitamin D Chemo toxicities: 1.Nausea grade 1 2.fatigue 3.  Intractable pain issues  Our plan is to perform scans prior to cycle 4. Return to clinic in 2 weeks to discuss the scan results and for cycle 4 of chemotherapy.

## 2017-08-21 DIAGNOSIS — I10 Essential (primary) hypertension: Secondary | ICD-10-CM | POA: Diagnosis not present

## 2017-08-21 DIAGNOSIS — C799 Secondary malignant neoplasm of unspecified site: Secondary | ICD-10-CM | POA: Diagnosis not present

## 2017-08-21 DIAGNOSIS — J309 Allergic rhinitis, unspecified: Secondary | ICD-10-CM | POA: Diagnosis not present

## 2017-08-21 DIAGNOSIS — E1165 Type 2 diabetes mellitus with hyperglycemia: Secondary | ICD-10-CM | POA: Diagnosis not present

## 2017-08-22 ENCOUNTER — Emergency Department (HOSPITAL_COMMUNITY)
Admission: EM | Admit: 2017-08-22 | Discharge: 2017-08-22 | Disposition: A | Discharge status: Home / Self Care | Payer: Medicare Other | Attending: Emergency Medicine | Admitting: Emergency Medicine

## 2017-08-22 ENCOUNTER — Encounter (HOSPITAL_COMMUNITY): Payer: Self-pay

## 2017-08-22 ENCOUNTER — Other Ambulatory Visit: Payer: Self-pay

## 2017-08-22 DIAGNOSIS — Z853 Personal history of malignant neoplasm of breast: Secondary | ICD-10-CM | POA: Diagnosis not present

## 2017-08-22 DIAGNOSIS — S61212A Laceration without foreign body of right middle finger without damage to nail, initial encounter: Secondary | ICD-10-CM | POA: Insufficient documentation

## 2017-08-22 DIAGNOSIS — W268XXA Contact with other sharp object(s), not elsewhere classified, initial encounter: Secondary | ICD-10-CM | POA: Diagnosis not present

## 2017-08-22 DIAGNOSIS — Z79899 Other long term (current) drug therapy: Secondary | ICD-10-CM | POA: Insufficient documentation

## 2017-08-22 DIAGNOSIS — Y999 Unspecified external cause status: Secondary | ICD-10-CM | POA: Diagnosis not present

## 2017-08-22 DIAGNOSIS — Z23 Encounter for immunization: Secondary | ICD-10-CM | POA: Insufficient documentation

## 2017-08-22 DIAGNOSIS — E119 Type 2 diabetes mellitus without complications: Secondary | ICD-10-CM | POA: Insufficient documentation

## 2017-08-22 DIAGNOSIS — Y93G1 Activity, food preparation and clean up: Secondary | ICD-10-CM | POA: Insufficient documentation

## 2017-08-22 DIAGNOSIS — Y92030 Kitchen in apartment as the place of occurrence of the external cause: Secondary | ICD-10-CM | POA: Insufficient documentation

## 2017-08-22 MED ORDER — BACITRACIN ZINC 500 UNIT/GM EX OINT
TOPICAL_OINTMENT | Freq: Two times a day (BID) | CUTANEOUS | Status: DC
  Administered 2017-08-22: 21:00:00 via TOPICAL

## 2017-08-22 MED ORDER — LIDOCAINE HCL (PF) 1 % IJ SOLN
5.0000 mL | Freq: Once | INTRAMUSCULAR | Status: AC
  Administered 2017-08-22: 5 mL
  Filled 2017-08-22: qty 30

## 2017-08-22 MED ORDER — TETANUS-DIPHTH-ACELL PERTUSSIS 5-2.5-18.5 LF-MCG/0.5 IM SUSP
0.5000 mL | Freq: Once | INTRAMUSCULAR | Status: AC
  Administered 2017-08-22: 0.5 mL via INTRAMUSCULAR
  Filled 2017-08-22: qty 0.5

## 2017-08-22 NOTE — ED Notes (Signed)
Bed: WTR9 Expected date:  Expected time:  Means of arrival:  Comments: 

## 2017-08-22 NOTE — ED Triage Notes (Signed)
Pt reports a finger lac on her R middle finger from washing dishes. Bleeding controlled. Last cancer treatment on Tuesday, she states she feels normal and is having no complaints related to her cancer diagnosis.

## 2017-08-22 NOTE — Discharge Instructions (Signed)
Return in 10 days for suture removal.  You can return to this ED, in the ED, any urgent care or your primary care's office for removal.

## 2017-08-22 NOTE — ED Provider Notes (Signed)
[facility] DEPT Provider Note   CSN: [id number] Arrival date & time: 08/22/17  1737     History   Chief Complaint Chief Complaint  Patient presents with  . Laceration    R middle finger    HPI Debbie Martinez is a 42 y.o. female who is status post radiation therapy for triple positive breast cancer, hypertension, who presents to ED for evaluation of dominant right third digit laceration that occurred just prior to arrival.  She was washing dishes when she excellently cut her finger on the sharp edge of a dish.  Bruising has been controlled with pressure.  Denies any blood thinner use.  Denies any other injuries.  Unsure when last tetanus was.  HPI  Past Medical History:  Diagnosis Date  . Anxiety    gets sweaty and short of breath  . Breast cancer ([place]) 01/20/14   triple positive  . Diabetes   . GERD (gastroesophageal reflux disease)    does not take anything  . History of radiation therapy 11/03/14- 12/14/14   Right Breast, supraclavicular nodes, axillary nodes, and internal mammary nodes.  . History of radiation therapy 01/04/16- 01/23/16   Spine metastasis L2-S2  . Hypertension   . Shortness of breath    'anxiety'    Patient Active Problem List   Diagnosis Date Noted  . Port catheter in place 12/25/2016  . Diabetes ([place]) 11/12/2016  . Goals of care, counseling/discussion 02/13/2016  . Nausea without vomiting 01/27/2016  . Cancer associated pain 01/27/2016  . Dehydration 01/27/2016  . Cord compression ([place]) 01/14/2016  . Bone metastasis ([place]) 01/14/2016  . Breast carcinoma metastatic to multiple sites ([place]) 12/31/2015  . Back pain 12/31/2015  . Hypertension 12/31/2015  . Anxiety 12/31/2015  . Genetic testing 02/25/2015  . Chemotherapy induced nausea and vomiting 03/25/2014  . 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 Constipation 03/25/2014  . Breast cancer of upper-inner quadrant of right female breast ([place])  01/15/2014    Past Surgical History:  Procedure Laterality Date  . BREAST LUMPECTOMY WITH NEEDLE LOCALIZATION AND AXILLARY SENTINEL LYMPH NODE BX Right 08/23/2014   Procedure: RIGHT BREAST LUMPECTOMY WITH NEEDLE LOCALIZATION(X'S 2)AND RIGHT AXILLARY SENTINEL LYMPH NODE Biopies;  Surgeon: Autumn Messing III, MD;  Location: [facility];  Service: General;  Laterality: Right;  . CESAREAN SECTION     2008  . KELOID EXCISION Left    ear  . PORTACATH PLACEMENT Left 01/26/2014   Procedure: INSERTION PORT-A-CATH;  Surgeon: Autumn Messing III, MD;  Location: [facility];  Service: General;  Laterality: Left;  . RE-EXCISION OF BREAST CANCER,SUPERIOR MARGINS Right 09/15/2014   Procedure: RE-EXCISION OF RIGHT BREAST CANCER,SUPERIOR AND INFERIOR MARGINS;  Surgeon: Autumn Messing III, MD;  Location: [facility];  Service: General;  Laterality: Right;     OB History   None      Home Medications    Prior to Admission medications   Medication Sig Start Date End Date Taking? Authorizing Provider  acetaminophen (TYLENOL) 500 MG tablet Take 1,000 mg by mouth every 4 (four) hours as needed for moderate pain.   Yes [provider]  Cholecalciferol (VITAMIN D-3 PO) Take 1 capsule by mouth daily.   Yes [provider]  ENSURE (ENSURE) Take 237 mLs by mouth 2 (two) times daily between meals.    Yes [provider]  eriBULin Mesylate (HALAVEN IV) Inject into the vein. Injection given at [facility]   Yes [provider]  fluticasone (  FLONASE) 50 MCG/ACT nasal spray Place 2 sprays into both nostrils 2 (two) times daily as needed for allergies. 08/05/17  Yes [provider]  gabapentin (NEURONTIN) 300 MG capsule Take 1 capsule (300 mg total) by mouth 3 (three) times daily. 05/30/16  Yes [name], MD  Iron-Vitamins (GERITOL) LIQD Take 5 mLs by mouth daily.   Yes [provider]  JANUMET 50-500 MG tablet Take 1 tablet by mouth daily. 07/10/17  Yes [provider]    Lidocaine-Prilocaine, Bulk, 2.5-2.5 % CREA Apply 5 mLs topically as needed. 2 hours before use, cover with plawtic wrap. 05/16/17  Yes [name], MD  omeprazole (PRILOSEC) 20 MG capsule Take 1 capsule (20 mg total) by mouth daily. Take while using dexamethasone to prevent heartburn. 01/09/16  Yes [name], MD  ondansetron (ZOFRAN) 8 MG tablet Take 1 tablet (8 mg total) by mouth 2 (two) times daily as needed (Nausea or vomiting). 07/01/17  Yes [name], MD  oxyCODONE-acetaminophen (PERCOCET/ROXICET) 5-325 MG tablet Take 1 tablet by mouth every 6 (six) hours as needed for severe pain. 08/20/17  Yes [name], MD  polyethylene glycol (MIRALAX / GLYCOLAX) packet Take 17 g by mouth daily. 01/05/16  Yes Charlynne Cousins, MD  prochlorperazine (COMPAZINE) 10 MG tablet Take 1 tablet (10 mg total) by mouth every 6 (six) hours as needed (Nausea or vomiting). 07/01/17  Yes [name], MD  valsartan (DIOVAN) 80 MG tablet Take 1 tablet (80 mg total) by mouth daily. 07/02/17  Yes [name], MD  valsartan-hydrochlorothiazide (DIOVAN-HCT) 80-12.5 MG tablet Take 1 tablet by mouth daily. 08/21/17  Yes [provider]  ACCU-CHEK AVIVA PLUS test strip USE AS DIRECTED 4 TIMES A DAY 12/27/16   [name], MD  ACCU-CHEK SOFTCLIX LANCETS lancets 100 each by Other route 4 (four) times daily. Use as instructed 11/12/16   [name], MD  blood glucose meter kit and supplies KIT Dispense based on patient and insurance preference. Use up to four times daily as directed. (FOR ICD-9 250.00, 250.01). 01/12/16   [name], MD  diazepam (VALIUM) 5 MG tablet Take 1 tablet (5 mg total) by mouth 2 (two) times daily. Patient not taking: Reported on 03/14/2016 10/06/15   Nona Dell, PA-C  lidocaine-prilocaine (EMLA) cream Apply to affected area once Patient not taking: Reported on 08/22/2017 07/01/17   [name], MD  metFORMIN (GLUCOPHAGE) 500 MG tablet Take 1 tablet (500 mg total) by mouth  2 (two) times daily with a meal. Patient not taking: Reported on 05/30/2016 03/02/16   [name], MD  methocarbamol (ROBAXIN) 500 MG tablet Take 1 tablet (500 mg total) by mouth 2 (two) times daily as needed for muscle spasms. Patient not taking: Reported on 08/22/2017 11/03/15   Ward, Ozella Almond, PA-C    Family History Family History  Problem Relation Age of Onset  . Heart attack Father 66  . Lupus Maternal Aunt   . Prostate cancer Maternal Grandfather 43  . Dementia Paternal Grandmother   . Leukemia Cousin 11       paternal first cousin    Social History Social History   Tobacco Use  . Smoking status: Never Smoker  . Smokeless tobacco: Never Used  Substance Use Topics  . Alcohol use: Yes    Comment: Rarely- maybe  every 6 months  . Drug use: No     Allergies   Lisinopril and Tamoxifen   Review of Systems Review of Systems  Constitutional: Negative for chills  and fever.  Gastrointestinal: Negative for nausea and vomiting.  Musculoskeletal: Negative for arthralgias, myalgias and neck pain.  Skin: Positive for wound.  Neurological: Negative for weakness and numbness.     Physical Exam Updated Vital Signs BP (!) 138/94 (BP Location: Left Arm)   Pulse (!) 101   Temp 98.6 F (37 C) (Oral)   Resp 16   SpO2 100%   Physical Exam  Constitutional: She appears well-developed and well-nourished. No distress.  HENT:  Head: Normocephalic and atraumatic.  Eyes: Conjunctivae and EOM are normal. No scleral icterus.  Neck: Normal range of motion.  Pulmonary/Chest: Effort normal. No respiratory distress.  Neurological: She is alert.  Skin: No rash noted. She is not diaphoretic.  Approximately 1.5 cm linear laceration to lateral side of right third digit.  Bleeding is controlled.  No changes to range of motion of digits.  Psychiatric: She has a normal mood and affect.  Nursing note and vitals reviewed.    ED Treatments / Results  Labs (all labs ordered are listed,  but only abnormal results are displayed) Labs Reviewed - No data to display  EKG None  Radiology No results found.  Procedures .Marland KitchenLaceration Repair Date/Time: 08/22/2017 8:14 PM Performed by: Delia Heady, PA-C Authorized by: Delia Heady, PA-C   Consent:    Consent obtained:  Verbal   Consent given by:  Patient   Risks discussed:  Infection, need for additional repair, nerve damage, pain, poor cosmetic result, poor wound healing, retained foreign body, vascular damage and tendon damage Anesthesia (see MAR for exact dosages):    Anesthesia method:  Local infiltration   Local anesthetic:  Lidocaine 1% w/o epi Laceration details:    Location:  Finger   Finger location:  R long finger   Length (cm):  1.5 Repair type:    Repair type:  Simple Exploration:    Hemostasis achieved with:  Direct pressure Treatment:    Area cleansed with:  Saline and Betadine   Amount of cleaning:  Standard   Irrigation solution:  Sterile saline   Irrigation method:  Syringe and pressure wash Skin repair:    Repair method:  Sutures   Suture size:  5-0   Wound skin closure material used: Ethilon.   Suture technique:  Simple interrupted   Number of sutures:  4 Approximation:    Approximation:  Close Post-procedure details:    Dressing:  Antibiotic ointment   Patient tolerance of procedure:  Tolerated well, no immediate complications   (including critical care time)  Medications Ordered in ED Medications  lidocaine (PF) (XYLOCAINE) 1 % injection 5 mL (has no administration in time range)  Tdap (BOOSTRIX) injection 0.5 mL (has no administration in time range)  bacitracin ointment (has no administration in time range)     Initial Impression / Assessment and Plan / ED Course  I have reviewed the triage vital signs and the nursing notes.  Pertinent labs & imaging results that were available during my care of the patient were reviewed by me and considered in my medical decision making (see  chart for details).     Patient presents to ED for laceration of right third digit that occurred prior to arrival while washing the dishes.  Unknown last tetanus. Patient counseled on wound care. Patient counseled on need to return or see PCP/urgent care for suture removal in 10 days. Patient was urged to return to the Emergency Department urgently with worsening pain, swelling, expanding erythema especially if it streaks away from  the affected area, fever, or if they have any other concerns. Patient verbalized understanding.  Updated tetanus here.  Portions of this note were generated with [employer]. Dictation errors may occur despite best attempts at proofreading.   Final Clinical Impressions(s) / ED Diagnoses   Final diagnoses:  Laceration of right middle finger without foreign body without damage to nail, initial encounter    ED Discharge Orders    None       Delia Heady, PA-C 08/22/17 2017    Gareth Morgan, MD 08/24/17 1232

## 2017-08-23 ENCOUNTER — Other Ambulatory Visit: Payer: Self-pay

## 2017-08-26 ENCOUNTER — Telehealth: Payer: Self-pay

## 2017-08-26 ENCOUNTER — Other Ambulatory Visit: Payer: Self-pay

## 2017-08-26 DIAGNOSIS — C50211 Malignant neoplasm of upper-inner quadrant of right female breast: Secondary | ICD-10-CM

## 2017-08-26 NOTE — Telephone Encounter (Signed)
Pt calling to report that she had recently received a tetanus shot over the weekend d/t cutting her finger. Pt noticed pain and swelling at the injection site after 1-2 days. She noticed a blister and have been cleaning and putting on otc abx cream and taking benadryl to see if it is a reaction. Pt calling to report that the swelling is getting bigger and the blister is now draining clear fluid. Pt currently on chemo every 2 weeks. Advised that pt come in to see Van,PA (Symptom management) tomorrow for evaluation. Pt agreeable to plan and confirmed schedule for 08/27/17.   Told pt to wash and monitor the site for warmth, increased swelling, pain, fever, and purulent drainage. Pt may take otc ibuprofen or tylenol for pain/discomfort. Told pt to continue to put antibiotic ointment and cover the site. Pt verbalized understanding.

## 2017-08-27 ENCOUNTER — Inpatient Hospital Stay: Payer: Medicare Other | Attending: Hematology and Oncology | Admitting: Medical

## 2017-08-27 ENCOUNTER — Encounter: Payer: Self-pay | Admitting: Medical

## 2017-08-27 VITALS — BP 122/88 | HR 116 | Temp 98.1°F | Resp 18 | Ht 67.0 in | Wt 167.6 lb

## 2017-08-27 DIAGNOSIS — C787 Secondary malignant neoplasm of liver and intrahepatic bile duct: Secondary | ICD-10-CM | POA: Insufficient documentation

## 2017-08-27 DIAGNOSIS — I2699 Other pulmonary embolism without acute cor pulmonale: Secondary | ICD-10-CM | POA: Insufficient documentation

## 2017-08-27 DIAGNOSIS — B029 Zoster without complications: Secondary | ICD-10-CM

## 2017-08-27 DIAGNOSIS — Z7901 Long term (current) use of anticoagulants: Secondary | ICD-10-CM | POA: Insufficient documentation

## 2017-08-27 DIAGNOSIS — C773 Secondary and unspecified malignant neoplasm of axilla and upper limb lymph nodes: Secondary | ICD-10-CM | POA: Insufficient documentation

## 2017-08-27 DIAGNOSIS — D6481 Anemia due to antineoplastic chemotherapy: Secondary | ICD-10-CM | POA: Insufficient documentation

## 2017-08-27 DIAGNOSIS — C50211 Malignant neoplasm of upper-inner quadrant of right female breast: Secondary | ICD-10-CM | POA: Diagnosis not present

## 2017-08-27 DIAGNOSIS — M5432 Sciatica, left side: Secondary | ICD-10-CM | POA: Insufficient documentation

## 2017-08-27 DIAGNOSIS — E119 Type 2 diabetes mellitus without complications: Secondary | ICD-10-CM | POA: Insufficient documentation

## 2017-08-27 DIAGNOSIS — C7951 Secondary malignant neoplasm of bone: Secondary | ICD-10-CM | POA: Insufficient documentation

## 2017-08-27 DIAGNOSIS — M2550 Pain in unspecified joint: Secondary | ICD-10-CM | POA: Diagnosis not present

## 2017-08-27 DIAGNOSIS — Z170 Estrogen receptor positive status [ER+]: Secondary | ICD-10-CM | POA: Insufficient documentation

## 2017-08-27 MED ORDER — VALACYCLOVIR HCL 1 G PO TABS: 1000.0000 mg | ORAL_TABLET | Freq: Three times a day (TID) | ORAL | 0 refills | Status: DC

## 2017-08-27 MED FILL — valACYclovir HCL 1 GM TABS: 1 | 7 days supply | Qty: 21 | Fill #0

## 2017-08-27 NOTE — Progress Notes (Signed)
[facility] Progress Note   Debbie Martinez [id number] [birth year] 42 y.o.  Debbie Martinez is managed by Dr. [name]  Actively treated with chemotherapy: yes  Current Therapy: Eribulin  Last Treated: 08/20/2017  Assessment: Plan:    Herpes zoster without complication - Plan: valACYclovir (VALTREX) 1000 MG tablet  Arthralgia, unspecified joint   Herpes zoster of the left posterior upper extremity: The patient was given a prescription for Valtrex 100 mg p.o. 3 times daily for 7 days.  She was told to continue Neurontin.  She has Percocet as needed for pain.  Arthralgias status post chemotherapy: The patient is a prescription for Percocet.  She was told to use this as needed for her arthralgias that occur several days after her therapy.  Please see After Visit Summary for patient specific instructions.  Future Appointments  Date Time Provider [place]  09/03/2017 10:45 AM [facility] LAB 5 [facility] None  09/03/2017 11:00 AM [facility] INJ NURSE [facility] None  09/03/2017 11:30 AM [name], MD [facility] None  09/03/2017 12:30 PM [facility] B5 [facility] None  09/10/2017 10:00 AM [facility] LAB 6 [facility] None  09/10/2017 10:15 AM [facility] FLUSH NURSE [facility] None  09/10/2017 11:30 AM [facility] A3 [facility] None    No orders of the defined types were placed in this encounter.      Subjective:   Patient ID:  Debbie Martinez is a 42 y.o. (DOB [birth year]) female.  Chief Complaint:  Chief Complaint  Patient presents with  . 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  She is managed by Dr. [name] and is currently treated with Eribulin.  She also receives monthly Xgeva.  Her last treatment with Eribulin was on 08/20/2017 and her last Xgeva dose was  on 07/30/2017.  She had a tetanus shot in her left arm last Thursday then noted fluid-filled blisters on her posterior left upper extremity on Friday.  She noted burning and itching of the area.  She is also had an increase in fatigue and anorexia prior to the development of these vesicles.  At least 1 of the vesicles ruptured by her report and drained clear fluid.  She reports that her daughter has been putting lotion this rash for her.  I cautioned her to avoid that.  She continues on Neurontin and also has a prescription for Percocet.  Medications: I have reviewed the patient's current medications.  Allergies:  Allergies  Allergen Reactions  . Lisinopril Swelling and Cough    Swelling under eyes and eyelids    . Tamoxifen Itching    Past Medical History:  Diagnosis Date  . Anxiety    gets sweaty and short of breath  . Breast cancer ([place]) 01/20/14   triple positive  . Diabetes   . GERD (gastroesophageal reflux disease)    does not take anything  . History of radiation therapy 11/03/14- 12/14/14   Right Breast, supraclavicular nodes, axillary nodes, and internal mammary nodes.  . History of radiation therapy 01/04/16- 01/23/16   Spine metastasis L2-S2  . Hypertension   . Shortness of breath    'anxiety'    Past Surgical History:  Procedure Laterality Date  . BREAST LUMPECTOMY WITH NEEDLE LOCALIZATION AND AXILLARY SENTINEL LYMPH NODE BX Right 08/23/2014   Procedure: RIGHT BREAST LUMPECTOMY WITH NEEDLE LOCALIZATION(X'S 2)AND RIGHT AXILLARY  SENTINEL LYMPH NODE Biopies;  Surgeon: Autumn Messing III, MD;  Location: [facility];  Service: General;  Laterality: Right;  . CESAREAN SECTION     2008  . KELOID EXCISION Left    ear  . PORTACATH PLACEMENT Left 01/26/2014   Procedure: INSERTION PORT-A-CATH;  Surgeon: Autumn Messing III, MD;  Location: [facility];  Service: General;  Laterality: Left;  . RE-EXCISION OF BREAST CANCER,SUPERIOR MARGINS Right 09/15/2014   Procedure: RE-EXCISION OF RIGHT BREAST  CANCER,SUPERIOR AND INFERIOR MARGINS;  Surgeon: Autumn Messing III, MD;  Location: [facility];  Service: General;  Laterality: Right;    Family History  Problem Relation Age of Onset  . Heart attack Father 79  . Lupus Maternal Aunt   . Prostate cancer Maternal Grandfather 21  . Dementia Paternal Grandmother   . Leukemia Cousin 14       paternal first cousin    Social History   Socioeconomic History  . Marital status: Single    Spouse name: Not on file  . Number of children: Not on file  . Years of education: Not on file  . Highest education level: Not on file  Occupational History  . Not on file  Social Needs  . Financial resource strain: Not on file  . Food insecurity:    Worry: Not on file    Inability: Not on file  . Transportation needs:    Medical: Not on file    Non-medical: Not on file  Tobacco Use  . Smoking status: Never Smoker  . Smokeless tobacco: Never Used  Substance and Sexual Activity  . Alcohol use: Yes    Comment: Rarely- maybe  every 6 months  . Drug use: No  . Sexual activity: Never  Lifestyle  . Physical activity:    Days per week: Not on file    Minutes per session: Not on file  . Stress: Not on file  Relationships  . Social connections:    Talks on phone: Not on file    Gets together: Not on file    Attends religious service: Not on file    Active member of club or organization: Not on file    Attends meetings of clubs or organizations: Not on file    Relationship status: Not on file  . Intimate partner violence:    Fear of current or ex partner: Not on file    Emotionally abused: Not on file    Physically abused: Not on file    Forced sexual activity: Not on file  Other Topics Concern  . Not on file  Social History Narrative  . Not on file    Past Medical History, Surgical history, Social history, and Family history were reviewed and updated as appropriate.   Please see review of systems for further details on the  patient's review from today.   Review of Systems:  Review of Systems  Constitutional: Positive for appetite change and fatigue. Negative for chills, diaphoresis and fever.  HENT: Negative for rhinorrhea, sinus pressure and sinus pain.   Respiratory: Negative for cough, chest tightness and shortness of breath.   Musculoskeletal: Positive for arthralgias and myalgias.  Skin: Positive for rash.    Objective:   Physical Exam:  BP 122/88 (BP Location: Left Arm, Patient Position: Sitting)   Pulse (!) 116   Temp 98.1 F (36.7 C) (Oral)   Resp 18   Ht 5\' 7"  (1.702 m)   Wt 167 lb 9.6 oz (76 kg)  SpO2 99%   BMI 26.25 kg/m  ECOG: 0  Physical Exam  Constitutional: No distress.  HENT:  Head: Normocephalic and atraumatic.  Eyes: Right eye exhibits no discharge. Left eye exhibits no discharge. No scleral icterus.  Cardiovascular: Normal rate, regular rhythm and normal heart sounds. Exam reveals no gallop and no friction rub.  No murmur heard. Pulmonary/Chest: Effort normal and breath sounds normal. No respiratory distress. She has no wheezes. She has no rales.  Skin: Rash noted. She is not diaphoretic. There is erythema.  Multiple large vesicles are noted over the anterior left upper extremity.  These have bases are slightly erythematous.  Psychiatric: She has a normal mood and affect. Her behavior is normal. Judgment and thought content normal.        Lab Review:     Component Value Date/Time   NA 139 08/20/2017 1030   NA 139 04/09/2017 0950   K 3.7 08/20/2017 1030   K 3.3 (L) 04/09/2017 0950   CL 104 08/20/2017 1030   CO2 24 08/20/2017 1030   CO2 25 04/09/2017 0950   GLUCOSE 212 (H) 08/20/2017 1030   GLUCOSE 213 (H) 04/09/2017 0950   BUN 7 08/20/2017 1030   BUN 14.9 04/09/2017 0950   CREATININE 0.67 08/20/2017 1030   CREATININE 0.9 04/09/2017 0950   CALCIUM 8.9 08/20/2017 1030   CALCIUM 9.0 04/09/2017 0950   PROT 7.5 08/20/2017 1030   PROT 7.1 04/09/2017 0950    ALBUMIN 3.5 08/20/2017 1030   ALBUMIN 3.6 04/09/2017 0950   AST 21 08/20/2017 1030   AST 11 04/09/2017 0950   ALT 26 08/20/2017 1030   ALT 12 04/09/2017 0950   ALKPHOS 94 08/20/2017 1030   ALKPHOS 63 04/09/2017 0950   BILITOT 0.3 08/20/2017 1030   BILITOT 0.37 04/09/2017 0950   GFRNONAA >60 08/20/2017 1030   GFRAA >60 08/20/2017 1030       Component Value Date/Time   WBC 5.5 08/20/2017 1030   WBC 6.9 08/13/2017 1100   RBC 3.16 (L) 08/20/2017 1030   HGB 8.2 (L) 08/20/2017 1030   HGB 9.4 (L) 04/09/2017 0950   HCT 26.0 (L) 08/20/2017 1030   HCT 29.1 (L) 04/09/2017 0950   PLT 371 08/20/2017 1030   PLT 291 04/09/2017 0950   MCV 82.2 08/20/2017 1030   MCV 94.0 04/09/2017 0950   MCH 26.0 08/20/2017 1030   MCHC 31.6 08/20/2017 1030   RDW 21.5 (H) 08/20/2017 1030   RDW 15.4 (H) 04/09/2017 0950   LYMPHSABS 0.6 (L) 08/20/2017 1030   LYMPHSABS 0.4 (L) 04/09/2017 0950   MONOABS 0.3 08/20/2017 1030   MONOABS 0.1 04/09/2017 0950   EOSABS 0.0 08/20/2017 1030   EOSABS 0.0 04/09/2017 0950   BASOSABS 0.0 08/20/2017 1030   BASOSABS 0.0 04/09/2017 0950   -------------------------------  Imaging from last 24 hours (if applicable):  Radiology interpretation: No results found.      This case was discussed with Dr. Lindi Adie. He expresses agreement with my management of this patient.

## 2017-09-02 ENCOUNTER — Ambulatory Visit (HOSPITAL_COMMUNITY)
Admission: RE | Admit: 2017-09-02 | Discharge: 2017-09-02 | Disposition: A | Discharge status: Home / Self Care | Payer: Medicare Other | Source: Ambulatory Visit | Attending: Hematology and Oncology | Admitting: Hematology and Oncology

## 2017-09-02 ENCOUNTER — Encounter (HOSPITAL_COMMUNITY): Payer: Self-pay

## 2017-09-02 DIAGNOSIS — Z853 Personal history of malignant neoplasm of breast: Secondary | ICD-10-CM | POA: Diagnosis not present

## 2017-09-02 DIAGNOSIS — C7951 Secondary malignant neoplasm of bone: Secondary | ICD-10-CM | POA: Diagnosis not present

## 2017-09-02 DIAGNOSIS — R590 Localized enlarged lymph nodes: Secondary | ICD-10-CM | POA: Diagnosis not present

## 2017-09-02 DIAGNOSIS — C50211 Malignant neoplasm of upper-inner quadrant of right female breast: Secondary | ICD-10-CM | POA: Diagnosis not present

## 2017-09-02 DIAGNOSIS — Z170 Estrogen receptor positive status [ER+]: Secondary | ICD-10-CM | POA: Insufficient documentation

## 2017-09-02 DIAGNOSIS — R918 Other nonspecific abnormal finding of lung field: Secondary | ICD-10-CM | POA: Insufficient documentation

## 2017-09-02 DIAGNOSIS — K769 Liver disease, unspecified: Secondary | ICD-10-CM | POA: Insufficient documentation

## 2017-09-02 DIAGNOSIS — Z5111 Encounter for antineoplastic chemotherapy: Secondary | ICD-10-CM | POA: Diagnosis not present

## 2017-09-02 HISTORY — DX: Secondary malignant neoplasm of bone: C79.51

## 2017-09-02 HISTORY — DX: Secondary malignant neoplasm of liver and intrahepatic bile duct: C78.7

## 2017-09-02 MED ORDER — IOHEXOL 300 MG/ML  SOLN
100.0000 mL | Freq: Once | INTRAMUSCULAR | Status: AC | PRN
  Administered 2017-09-02: 100 mL via INTRAVENOUS

## 2017-09-02 MED ORDER — IOHEXOL 300 MG/ML  SOLN
30.0000 mL | Freq: Once | INTRAMUSCULAR | Status: AC | PRN
  Administered 2017-09-02: 30 mL via ORAL

## 2017-09-03 ENCOUNTER — Inpatient Hospital Stay: Payer: Medicare Other

## 2017-09-03 ENCOUNTER — Inpatient Hospital Stay (HOSPITAL_BASED_OUTPATIENT_CLINIC_OR_DEPARTMENT_OTHER): Payer: Medicare Other | Admitting: Hematology and Oncology

## 2017-09-03 ENCOUNTER — Telehealth: Payer: Self-pay | Admitting: *Deleted

## 2017-09-03 VITALS — BP 121/81 | HR 97 | Temp 99.0°F | Resp 17 | Ht 67.0 in | Wt 166.3 lb

## 2017-09-03 DIAGNOSIS — C773 Secondary and unspecified malignant neoplasm of axilla and upper limb lymph nodes: Secondary | ICD-10-CM | POA: Diagnosis not present

## 2017-09-03 DIAGNOSIS — Z7901 Long term (current) use of anticoagulants: Secondary | ICD-10-CM | POA: Diagnosis not present

## 2017-09-03 DIAGNOSIS — C50211 Malignant neoplasm of upper-inner quadrant of right female breast: Secondary | ICD-10-CM

## 2017-09-03 DIAGNOSIS — E119 Type 2 diabetes mellitus without complications: Secondary | ICD-10-CM | POA: Diagnosis not present

## 2017-09-03 DIAGNOSIS — C50919 Malignant neoplasm of unspecified site of unspecified female breast: Secondary | ICD-10-CM

## 2017-09-03 DIAGNOSIS — Z170 Estrogen receptor positive status [ER+]: Principal | ICD-10-CM

## 2017-09-03 DIAGNOSIS — B029 Zoster without complications: Secondary | ICD-10-CM | POA: Diagnosis not present

## 2017-09-03 DIAGNOSIS — M5432 Sciatica, left side: Secondary | ICD-10-CM | POA: Diagnosis not present

## 2017-09-03 DIAGNOSIS — M2550 Pain in unspecified joint: Secondary | ICD-10-CM | POA: Diagnosis not present

## 2017-09-03 DIAGNOSIS — Z95828 Presence of other vascular implants and grafts: Secondary | ICD-10-CM

## 2017-09-03 DIAGNOSIS — D6481 Anemia due to antineoplastic chemotherapy: Secondary | ICD-10-CM | POA: Diagnosis not present

## 2017-09-03 DIAGNOSIS — I2699 Other pulmonary embolism without acute cor pulmonale: Secondary | ICD-10-CM | POA: Diagnosis not present

## 2017-09-03 DIAGNOSIS — C50911 Malignant neoplasm of unspecified site of right female breast: Secondary | ICD-10-CM

## 2017-09-03 DIAGNOSIS — C7951 Secondary malignant neoplasm of bone: Secondary | ICD-10-CM | POA: Diagnosis not present

## 2017-09-03 DIAGNOSIS — C787 Secondary malignant neoplasm of liver and intrahepatic bile duct: Secondary | ICD-10-CM | POA: Diagnosis not present

## 2017-09-03 LAB — COMPREHENSIVE METABOLIC PANEL
ALK PHOS: 104 U/L (ref 40–150)
ALT: 22 U/L (ref 0–55)
AST: 23 U/L (ref 5–34)
Albumin: 3.5 g/dL (ref 3.5–5.0)
Anion gap: 9 (ref 3–11)
BUN: 9 mg/dL (ref 7–26)
CHLORIDE: 102 mmol/L (ref 98–109)
CO2: 26 mmol/L (ref 22–29)
Calcium: 9.3 mg/dL (ref 8.4–10.4)
Creatinine, Ser: 0.71 mg/dL (ref 0.60–1.10)
GFR calc Af Amer: >60 mL/min (ref >60)
GFR calc non Af Amer: >60 mL/min (ref >60)
GLUCOSE: 261 mg/dL — AB (ref 70–140)
Potassium: 3.9 mmol/L (ref 3.5–5.1)
SODIUM: 137 mmol/L (ref 136–145)
Total Bilirubin: 0.3 mg/dL (ref 0.2–1.2)
Total Protein: 7.9 g/dL (ref 6.4–8.3)

## 2017-09-03 LAB — CBC WITH DIFFERENTIAL/PLATELET
Basophils Absolute: 0 10*3/uL (ref 0.0–0.1)
Basophils Relative: 1 %
EOS ABS: 0 10*3/uL (ref 0.0–0.5)
EOS PCT: 1 %
HCT: 25.4 % — ABNORMAL LOW (ref 34.8–46.6)
HEMOGLOBIN: 8.2 g/dL — AB (ref 11.6–15.9)
LYMPHS ABS: 0.6 10*3/uL — AB (ref 0.9–3.3)
Lymphocytes Relative: 10 %
MCH: 25.8 pg (ref 25.1–34.0)
MCHC: 32.1 g/dL (ref 31.5–36.0)
MCV: 80.2 fL (ref 79.5–101.0)
MONO ABS: 0.4 10*3/uL (ref 0.1–0.9)
MONOS PCT: 8 %
Neutro Abs: 4.4 10*3/uL (ref 1.5–6.5)
Neutrophils Relative %: 80 %
PLATELETS: 263 10*3/uL (ref 145–400)
RBC: 3.17 MIL/uL — ABNORMAL LOW (ref 3.70–5.45)
RDW: 20.6 % — AB (ref 11.2–14.5)
WBC: 5.5 10*3/uL (ref 3.9–10.3)

## 2017-09-03 MED ORDER — LORAZEPAM 0.5 MG PO TABS: 0.5000 mg | ORAL_TABLET | Freq: Every evening | ORAL | 0 refills | Status: AC | PRN

## 2017-09-03 MED ORDER — LIDOCAINE-PRILOCAINE 2.5-2.5 % EX CREA: TOPICAL_CREAM | CUTANEOUS | 3 refills | Status: AC

## 2017-09-03 MED ORDER — SODIUM CHLORIDE 0.9% FLUSH
10.0000 mL | Freq: Once | INTRAVENOUS | Status: AC
  Administered 2017-09-03: 10 mL
  Filled 2017-09-03: qty 10

## 2017-09-03 MED ORDER — PROCHLORPERAZINE MALEATE 10 MG PO TABS: 10.0000 mg | ORAL_TABLET | Freq: Four times a day (QID) | ORAL | 1 refills | Status: AC | PRN

## 2017-09-03 MED ORDER — HEPARIN SOD (PORK) LOCK FLUSH 100 UNIT/ML IV SOLN
500.0000 [IU] | Freq: Once | INTRAVENOUS | Status: AC
  Administered 2017-09-03: 500 [IU]
  Filled 2017-09-03: qty 5

## 2017-09-03 MED ORDER — ONDANSETRON HCL 8 MG PO TABS: 8.0000 mg | ORAL_TABLET | Freq: Two times a day (BID) | ORAL | 1 refills | Status: DC | PRN

## 2017-09-03 MED FILL — PROCHLORPERAZINE 10 MG TAB: 10 | 7 days supply | Qty: 30 | Fill #1

## 2017-09-03 MED FILL — ONDANSETRON HCL 8 MG TABLET: 8 | 15 days supply | Qty: 30 | Fill #0

## 2017-09-03 MED FILL — LIDOCAINE-PRILOCAINE CREAM: 2.5-2.5 | 20 days supply | Qty: 30 | Fill #0

## 2017-09-03 MED FILL — LORazepam 0.5 MG TABS: 0.5 | 30 days supply | Qty: 30 | Fill #0

## 2017-09-03 NOTE — Addendum Note (Signed)
Addended by: Tora Kindred on: 09/03/2017 03:01 PM   Modules accepted: Orders

## 2017-09-03 NOTE — Progress Notes (Signed)
Patient Care Team: Lucianne Lei, MD as PCP - General (Family Medicine) [name], MD as Consulting Physician (General Surgery) [name], MD as Consulting Physician (Hematology and Oncology) [name], MD as Attending Physician (Radiation Oncology) Benson Norway, RN as Registered Nurse (Oncology) Sylvan Cheese, NP as Nurse Practitioner (Hematology and Oncology)  DIAGNOSIS:  Encounter Diagnoses  Name Primary?  . Malignant neoplasm of upper-inner quadrant of right breast in female, estrogen receptor positive ([place]) Yes  . 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 Distance between the 2 was 4.5 cm, right axillary lymph node enlargement      01/12/2014 Initial Biopsy    Right breast 3 biopsies: All were IDC with DCIS ER+ PR + Ki-67 85% HER-2 negative: Right axillary lymph node also positive for metastatic cancer ER positive HER-2 negative Ki-67 80% grade 2      01/18/2014 Breast MRI    Right breast 12:00 position 2.8 x 2 x 2.2 cm: 2:00 position 1.3 x 0.8 x 0.5 cm, right axilla multiple enlarged lymph nodes 1 cm in size, right retropectoral lymph node 0.6 cm      01/18/2014 Clinical Stage    Stage IIB T2 N1      01/25/2014 Procedure    Breast/Ovarian ([facility]) reveals no clinically significant variant at ATM, BARD1, BRCA1, BRCA2, BRIP1, CDH1, CHEK2, EPCAM, FANCC, MLH1, MSH2, MSH6, NBN, PALB2, PMS2, PTEN, RAD51C, RAD51D, TP53, and XRCC2.       02/11/2014 -  Neo-Adjuvant Chemotherapy    Doxorubicin and cyclophosphamide X 4 followed by Taxol weekly x12       07/12/2014 Breast MRI    Partial response to neoadjuvant chemotherapy right breast mass decreased from 2.8 cm to 2.3 cm, 2 small masses along the  medial margin also decreased; significant decrease right breast mass 11 to 12:00 and 13 mm to 8 mm, decrease in right axilla LN      08/23/2014 Definitive Surgery    Right lumpectomy/SLNB [name]) 2:00: IDC grade 3, 3.2 cm, high-grade DCIS, LV I present, PNI present,; 12:00: IDC grade 30.8 cm, high-grade DCIS, LV I, 4/4 lymph nodes positive with ECE, ER 90%, PR 40%, HER-2 negative margin positive      08/23/2014 Pathologic Stage    Stage IIIA: ypT2 ypN2a      09/15/2014 Surgery    Reexcision of margins: clear; no evidence of malignancy      11/03/2014 - 12/14/2014 Radiation Therapy    Adjuvant XRT Isidore Moos): Right Breast, supraclavicular nodes, axillary nodes, and internal mammary nodes: 50 Gy over 25 fractions; right breast boost: 10 Gy over 5 fractions. Total dose: 60 Gy      12/29/2014 -  Anti-estrogen oral therapy    Tamoxifen 20 mg daily      04/08/2015 Survivorship    Survivorship care plan completed and mailed to patient in lieu of in person visit at her request      12/31/2015 Relapse/Recurrence    Innumerable osseous metastases with acute pathologic fracture the L4 vertebral body. 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 L5-S1 protrusion with left more than right S1 impingement      01/05/2016 - 01/12/2016 Radiation Therapy    Palliative  radiation to the spine for cord compression      01/13/2016 -  Anti-estrogen oral therapy    Ribociclib 3 weeks on and 1 week off and Letrozole (with Zolodex until oophorectomy) and Xgeva for bone mets      06/17/2017 Pathology Results    Liver biopsy: Metastatic carcinoma: positive for cytokeratin 7, GCDFP, and GATA-3 but negative for cytokeratin 20. The immunoprofile is consistent with metastasis from the patient's known breast carcinoma.  ER 0%, PR 0%, Ki-67 20%, HER-2 negative ratio 1.23      07/02/2017 -  Chemotherapy    Halaven day 1 day 8 every 3 weeks      09/02/2017 Imaging    Progression of  disease.development of mediastinal lymph nodes 1.1 cm subcarinal node previously 0.5 cm, new 7 mm prevascular lymph node, small bilateral lung nodules 2 mm and 7 mm, increase in sclerotic lesions in the thoracic spine especially T8 and proximal left humerus;  Inc in liver mets 2.8 cm (was 1.5 cm), left hepatic lobe 1.9 cm was 0.9 cm, L4 compression fracture       Breast carcinoma metastatic to multiple sites ([place])   12/31/2015 Initial Diagnosis    Breast carcinoma metastatic to multiple sites ([place])      09/03/2017 -  Chemotherapy    The patient had palonosetron (ALOXI) injection 0.25 mg, 0.25 mg, Intravenous,  Once, 0 of 6 cycles CARBOplatin (PARAPLATIN) 700 mg in sodium chloride 0.9 % 250 mL chemo infusion, 700 mg (100 % of original dose 700 mg), Intravenous,  Once, 0 of 6 cycles Dose modification: 700 mg (original dose 700 mg, Cycle 1)  for chemotherapy treatment.        CHIEF COMPLIANT: Follow-up to discuss recently performed scans  INTERVAL HISTORY: Debbie Martinez is a 42 year old with above-mentioned history of metastatic breast cancer who is currently on Halaven chemotherapy and is here today after undergoing scans after 3 cycles to discuss results.  Overall she is tolerated Halaven fairly well.  The scans unfortunately showed progression of disease.  She is here to discuss her treatment options.  REVIEW OF SYSTEMS:   Constitutional: Denies fevers, chills or abnormal weight loss Eyes: Denies blurriness of vision Ears, nose, mouth, throat, and face: Denies mucositis or sore throat Respiratory: Denies cough, dyspnea or wheezes Cardiovascular: Denies palpitation, chest discomfort Gastrointestinal:  Denies nausea, heartburn or change in bowel habits Skin: Denies abnormal skin rashes Lymphatics: Denies new lymphadenopathy or easy bruising Neurological:Denies numbness, tingling or new weaknesses Behavioral/Psych: Mood is stable, no new changes  Extremities: No lower extremity  edema Breast:  denies any pain or lumps or nodules in either breasts All other systems were reviewed with the patient and are negative.  I have reviewed the past medical history, past surgical history, social history and family history with the patient and they are unchanged from previous note.  ALLERGIES:  is allergic to lisinopril and tamoxifen.  MEDICATIONS:  Current Outpatient Medications  Medication Sig Dispense Refill  . ACCU-CHEK AVIVA PLUS test strip USE AS DIRECTED 4 TIMES A DAY 1 each 12  . ACCU-CHEK SOFTCLIX LANCETS lancets 100 each by Other route 4 (four) times daily. Use as instructed 100 each 0  . acetaminophen (TYLENOL) 500 MG tablet Take 1,000 mg by mouth every 4 (four) hours as needed for moderate pain.    . blood glucose meter kit and supplies KIT Dispense based on patient and insurance preference. Use up to four times daily as directed. (FOR ICD-9 250.00,  250.01). 1 each 0  . Cholecalciferol (VITAMIN D-3 PO) Take 1 capsule by mouth daily.    . diazepam (VALIUM) 5 MG tablet Take 1 tablet (5 mg total) by mouth 2 (two) times daily. (Patient not taking: Reported on 03/14/2016) 10 tablet 0  . ENSURE (ENSURE) Take 237 mLs by mouth 2 (two) times daily between meals.     . eriBULin Mesylate (HALAVEN IV) Inject into the vein. Injection given at [facility]    . fluticasone (FLONASE) 50 MCG/ACT nasal spray Place 2 sprays into both nostrils 2 (two) times daily as needed for allergies.  2  . gabapentin (NEURONTIN) 300 MG capsule Take 1 capsule (300 mg total) by mouth 3 (three) times daily. 90 capsule 5  . Iron-Vitamins (GERITOL) LIQD Take 5 mLs by mouth daily.    Marland Kitchen JANUMET 50-500 MG tablet Take 1 tablet by mouth daily.  0  . lidocaine-prilocaine (EMLA) cream Apply to affected area once 30 g 3  . Lidocaine-Prilocaine, Bulk, 2.5-2.5 % CREA Apply 5 mLs topically as needed. 2 hours before use, cover with plawtic wrap. 30 g 2  . LORazepam (ATIVAN) 0.5 MG tablet Take 1 tablet (0.5 mg  total) by mouth at bedtime as needed (Nausea or vomiting). 30 tablet 0  . omeprazole (PRILOSEC) 20 MG capsule Take 1 capsule (20 mg total) by mouth daily. Take while using dexamethasone to prevent heartburn. 30 capsule 2  . ondansetron (ZOFRAN) 8 MG tablet Take 1 tablet (8 mg total) by mouth 2 (two) times daily as needed for refractory nausea / vomiting. Start on day 3 after chemo. 30 tablet 1  . oxyCODONE-acetaminophen (PERCOCET/ROXICET) 5-325 MG tablet Take 1 tablet by mouth every 6 (six) hours as needed for severe pain. 60 tablet 0  . polyethylene glycol (MIRALAX / GLYCOLAX) packet Take 17 g by mouth daily. 14 each 0  . prochlorperazine (COMPAZINE) 10 MG tablet Take 1 tablet (10 mg total) by mouth every 6 (six) hours as needed (Nausea or vomiting). 30 tablet 1  . valACYclovir (VALTREX) 1000 MG tablet Take 1 tablet (1,000 mg total) by mouth 3 (three) times daily. 21 tablet 0  . valsartan-hydrochlorothiazide (DIOVAN-HCT) 80-12.5 MG tablet Take 1 tablet by mouth daily.     No current facility-administered medications for this visit.     PHYSICAL EXAMINATION: ECOG PERFORMANCE STATUS: 1 - Symptomatic but completely ambulatory  Vitals:   09/03/17 1130  BP: 121/81  Pulse: 97  Resp: 17  Temp: 99 F (37.2 C)  SpO2: 100%   Filed Weights   09/03/17 1130  Weight: 166 lb 4.8 oz (75.4 kg)    GENERAL:alert, no distress and comfortable SKIN: skin color, texture, turgor are normal, no rashes or significant lesions EYES: normal, Conjunctiva are pink and non-injected, sclera clear OROPHARYNX:no exudate, no erythema and lips, buccal mucosa, and tongue normal  NECK: supple, thyroid normal size, non-tender, without nodularity LYMPH:  no palpable lymphadenopathy in the cervical, axillary or inguinal LUNGS: clear to auscultation and percussion with normal breathing effort HEART: regular rate & rhythm and no murmurs and no lower extremity edema ABDOMEN:abdomen soft, non-tender and normal bowel  sounds MUSCULOSKELETAL:no cyanosis of digits and no clubbing  NEURO: alert & oriented x 3 with fluent speech, no focal motor/sensory deficits EXTREMITIES: No lower extremity edema  LABORATORY DATA:  I have reviewed the data as listed CMP Latest Ref Rng & Units 09/03/2017 08/20/2017 08/13/2017  Glucose 70 - 140 mg/dL 261(H) 212(H) 169(H)  BUN 7 - 26 mg/dL 9  7 8  Creatinine 0.60 - 1.10 mg/dL 0.71 0.67 0.67  Sodium 136 - 145 mmol/L 137 139 137  Potassium 3.5 - 5.1 mmol/L 3.9 3.7 4.1  Chloride 98 - 109 mmol/L 102 104 104  CO2 22 - 29 mmol/L '26 24 23  ' Calcium 8.4 - 10.4 mg/dL 9.3 8.9 9.9  Total Protein 6.4 - 8.3 g/dL 7.9 7.5 8.0  Total Bilirubin 0.2 - 1.2 mg/dL 0.3 0.3 0.3  Alkaline Phos 40 - 150 U/L 104 94 106  AST 5 - 34 U/L '23 21 23  ' ALT 0 - 55 U/L '22 26 22    ' Lab Results  Component Value Date   WBC 5.5 09/03/2017   HGB 8.2 (L) 09/03/2017   HCT 25.4 (L) 09/03/2017   MCV 80.2 09/03/2017   PLT 263 09/03/2017   NEUTROABS 4.4 09/03/2017    ASSESSMENT & PLAN:  Breast cancer of upper-inner quadrant of right female breast ([place]) 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   Treatment summary:  1. Patient completed neoadjuvant dose dense Adriamycin and Cytoxan and weekly Taxol X 12 started 02/09/2014 and completed 07/01/14 2. Right double lumpectomy 2:00: IDC grade 3, 3.2 cm, high-grade DCIS, LV I present, PNI present,; 12:00: IDC grade 3; 0.8 cm, high-grade DCIS, LVI, 4/4 lymph nodes positive with ECE, ER 90%, PR 40%, HER-2 negative margin positive T2 N2 M0 stage IIIa 3. Patient is on [facility] clinical trial and she was randomized intraoperatively for NO lymph node dissection. 4.Tamoxifen 20 mg daily started 12/29/2014 stopped July 2016 for adverse effects 5. Relapsed/metastatic diseasediagnosed 12/31/2015 when she presented with cord compression with  innumerable bone metastases with L4 pathological fracture 7. Palliative XRT to the spine for cord compression 8.Ribociclibwith letrozole plus ovarian suppression started 01/13/2016-06/25/2017 9.  Halaven 07/02/2017-08/20/2017 --------------------------------------------------------------------------------------------------------------------------------------------------------- Liver biopsy 2/25/2019Metastatic carcinoma: positive for cytokeratin 7, GCDFP, and GATA-3 but negative for cytokeratin 20. The immunoprofile is consistent with metastasis from the patient's known breast carcinoma. ER 0%, PR 0%, Ki-67 20%, HER-2 negative ratio 1.23  Current treatmentplan:Halaven days 1 and8every 3 weeks completed 3 cycles Because of progression of disease treatment plan has been changed to carboplatin single agent given every 3 weeks I will request PDL 1 testing to see if she would benefit from immunotherapy.  Bone Mets: Xgeva with calcium and vitamin D  Radiology review:  09/19/2017: Progression of disease.development of mediastinal lymph nodes 1.1 cm subcarinal node previously 0.5 cm, new 7 mm prevascular lymph node, small bilateral lung nodules 2 mm and 7 mm, increase in sclerotic lesions in the thoracic spine especially T8 and proximal left humerus;  Inc in liver mets 2.8 cm (was 1.5 cm), left hepatic lobe 1.9 cm was 0.9 cm, L4 compression fracture  Based on these findings are recommended changing treatment from Halaven to carboplatin single agent given every 3 weeks.  Orders Placed This Encounter  Procedures  . CBC with Differential (Cancer Center Only)    Standing Status:   Standing    Number of Occurrences:   20    Standing Expiration Date:   09/04/2018  . CMP ([place] only)    Standing Status:   Standing    Number of Occurrences:   20    Standing Expiration Date:   09/04/2018   The patient has a good understanding of the overall plan. she agrees with it. she will call with any  problems that  may develop before the next visit here.   Harriette Ohara, MD 09/03/17

## 2017-09-03 NOTE — Addendum Note (Signed)
Addended by: Shari Heritage on: 09/03/2017 01:02 PM   Modules accepted: Orders

## 2017-09-03 NOTE — Progress Notes (Signed)
DISCONTINUE ON PATHWAY REGIMEN - Breast     A cycle is every 21 days:     Eribulin mesylate   **Always confirm dose/schedule in your pharmacy ordering system**    REASON: Disease Progression PRIOR TREATMENT: [id number]: Eribulin 1.4 mg/m2 D1, 8 q21 Days Until Progression or Unacceptable Toxicity TREATMENT RESPONSE: Progressive Disease (PD)  START ON PATHWAY REGIMEN - Breast     A cycle is every 21 days:     Carboplatin   **Always confirm dose/schedule in your pharmacy ordering system**    Patient Characteristics: Distant Metastases or Locoregional Recurrent Disease - Unresected, HER2 Negative/Unknown/Equivocal, ER Negative/Unknown, Chemotherapy, [facility] and Beyond, Prior Eribulin Therapeutic Status: Distant Metastases BRCA Mutation Status: Absent ER Status: Negative (-) HER2 Status: Negative (-) PR Status: Negative (-) Line of therapy: Fourth Line and Beyond Intent of Therapy: Non-Curative / Palliative Intent, Discussed with Patient

## 2017-09-03 NOTE — Telephone Encounter (Signed)
Spoke to pt. Confirmed port de-accessed since chemo was cx today. Confirmed chemo appt for next week on 5/21. Denies further needs or questions at this time.

## 2017-09-03 NOTE — Assessment & Plan Note (Signed)
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   Treatment summary:  1. Patient completed neoadjuvant dose dense Adriamycin and Cytoxan and weekly Taxol X 12 started 02/09/2014 and completed 07/01/14 2. Right double lumpectomy 2:00: IDC grade 3, 3.2 cm, high-grade DCIS, LV I present, PNI present,; 12:00: IDC grade 3; 0.8 cm, high-grade DCIS, LVI, 4/4 lymph nodes positive with ECE, ER 90%, PR 40%, HER-2 negative margin positive T2 N2 M0 stage IIIa 3. Patient is on [employer] clinical trial and she was randomized intraoperatively for NO lymph node dissection. 4.Tamoxifen 20 mg daily started 12/29/2014 stopped July 2016 for adverse effects 5. 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 Palliative XRT to the spine for cord compression 8.Ribociclibwith letrozole plus ovarian suppression started 01/13/2016-06/25/2017 ---------------------------------------------------------------------------------------------------------------------------------------------------------  CT chest abdomen pelvis: There are over 20 masses scattered throughout the liver  Liver biopsy 2/25/2019Metastatic carcinoma: positive for cytokeratin 7, GCDFP, and GATA-3 but negative for cytokeratin 20. The immunoprofile is consistent with metastasis from the patient's known breast carcinoma. ER 0%, PR 0%, Ki-67 20%, HER-2 negative ratio 1.23  Current treatmentplan:Halaven days 1 and8every 3 weeks,today is cycle4 day 1 Bone Mets: Xgeva with calcium and vitamin D Chemo toxicities: 1.Nausea grade 1 2.fatigue 3.  Intractable pain issues: I gave her a new prescription for Percocet today.  Radiology review:  09/19/2017: Progression of disease.development of mediastinal lymph nodes  1.1 cm subcarinal node previously 0.5 cm, new 7 mm prevascular lymph node, small bilateral lung nodules 2 mm and 7 mm, increase in sclerotic lesions in the thoracic spine especially T8 and proximal left humerus;  Inc in liver mets 2.8 cm (was 1.5 cm), left hepatic lobe 1.9 cm was 0.9 cm, L4 compression fracture  Based on these findings are recommended changing treatment from Halaven to carboplatin single agent given every 3 weeks.

## 2017-09-04 ENCOUNTER — Other Ambulatory Visit: Payer: Self-pay

## 2017-09-04 ENCOUNTER — Emergency Department (HOSPITAL_COMMUNITY): Payer: Medicare Other

## 2017-09-04 ENCOUNTER — Observation Stay (HOSPITAL_COMMUNITY)
Admission: EM | Admit: 2017-09-04 | Discharge: 2017-09-07 | Disposition: A | Discharge status: Home / Self Care | Payer: Medicare Other | Attending: Internal Medicine | Admitting: Internal Medicine

## 2017-09-04 ENCOUNTER — Encounter (HOSPITAL_COMMUNITY): Payer: Self-pay | Admitting: Emergency Medicine

## 2017-09-04 ENCOUNTER — Telehealth: Payer: Self-pay | Admitting: *Deleted

## 2017-09-04 ENCOUNTER — Telehealth: Payer: Self-pay

## 2017-09-04 DIAGNOSIS — Z79899 Other long term (current) drug therapy: Secondary | ICD-10-CM | POA: Insufficient documentation

## 2017-09-04 DIAGNOSIS — Z8042 Family history of malignant neoplasm of prostate: Secondary | ICD-10-CM | POA: Diagnosis not present

## 2017-09-04 DIAGNOSIS — J45909 Unspecified asthma, uncomplicated: Secondary | ICD-10-CM | POA: Diagnosis not present

## 2017-09-04 DIAGNOSIS — I10 Essential (primary) hypertension: Secondary | ICD-10-CM | POA: Diagnosis present

## 2017-09-04 DIAGNOSIS — Z794 Long term (current) use of insulin: Secondary | ICD-10-CM | POA: Insufficient documentation

## 2017-09-04 DIAGNOSIS — M79604 Pain in right leg: Secondary | ICD-10-CM | POA: Insufficient documentation

## 2017-09-04 DIAGNOSIS — Z888 Allergy status to other drugs, medicaments and biological substances status: Secondary | ICD-10-CM | POA: Diagnosis not present

## 2017-09-04 DIAGNOSIS — Z820 Family history of epilepsy and other diseases of the nervous system: Secondary | ICD-10-CM | POA: Insufficient documentation

## 2017-09-04 DIAGNOSIS — Z170 Estrogen receptor positive status [ER+]: Secondary | ICD-10-CM | POA: Insufficient documentation

## 2017-09-04 DIAGNOSIS — E119 Type 2 diabetes mellitus without complications: Secondary | ICD-10-CM | POA: Diagnosis not present

## 2017-09-04 DIAGNOSIS — Z840 Family history of diseases of the skin and subcutaneous tissue: Secondary | ICD-10-CM | POA: Insufficient documentation

## 2017-09-04 DIAGNOSIS — I081 Rheumatic disorders of both mitral and tricuspid valves: Secondary | ICD-10-CM | POA: Insufficient documentation

## 2017-09-04 DIAGNOSIS — C787 Secondary malignant neoplasm of liver and intrahepatic bile duct: Secondary | ICD-10-CM | POA: Diagnosis not present

## 2017-09-04 DIAGNOSIS — R509 Fever, unspecified: Secondary | ICD-10-CM | POA: Diagnosis not present

## 2017-09-04 DIAGNOSIS — R072 Precordial pain: Secondary | ICD-10-CM | POA: Diagnosis not present

## 2017-09-04 DIAGNOSIS — G893 Neoplasm related pain (acute) (chronic): Secondary | ICD-10-CM | POA: Insufficient documentation

## 2017-09-04 DIAGNOSIS — B029 Zoster without complications: Secondary | ICD-10-CM | POA: Insufficient documentation

## 2017-09-04 DIAGNOSIS — C50919 Malignant neoplasm of unspecified site of unspecified female breast: Secondary | ICD-10-CM | POA: Diagnosis not present

## 2017-09-04 DIAGNOSIS — K5900 Constipation, unspecified: Secondary | ICD-10-CM | POA: Insufficient documentation

## 2017-09-04 DIAGNOSIS — F419 Anxiety disorder, unspecified: Secondary | ICD-10-CM | POA: Diagnosis not present

## 2017-09-04 DIAGNOSIS — Z923 Personal history of irradiation: Secondary | ICD-10-CM | POA: Diagnosis not present

## 2017-09-04 DIAGNOSIS — R0902 Hypoxemia: Secondary | ICD-10-CM | POA: Diagnosis not present

## 2017-09-04 DIAGNOSIS — C7951 Secondary malignant neoplasm of bone: Secondary | ICD-10-CM | POA: Diagnosis not present

## 2017-09-04 DIAGNOSIS — R000 Tachycardia, unspecified: Secondary | ICD-10-CM

## 2017-09-04 DIAGNOSIS — J219 Acute bronchiolitis, unspecified: Secondary | ICD-10-CM | POA: Diagnosis not present

## 2017-09-04 DIAGNOSIS — C50211 Malignant neoplasm of upper-inner quadrant of right female breast: Secondary | ICD-10-CM | POA: Diagnosis present

## 2017-09-04 DIAGNOSIS — Z8249 Family history of ischemic heart disease and other diseases of the circulatory system: Secondary | ICD-10-CM | POA: Diagnosis not present

## 2017-09-04 DIAGNOSIS — E860 Dehydration: Secondary | ICD-10-CM | POA: Diagnosis not present

## 2017-09-04 DIAGNOSIS — Z806 Family history of leukemia: Secondary | ICD-10-CM | POA: Diagnosis not present

## 2017-09-04 DIAGNOSIS — M79605 Pain in left leg: Secondary | ICD-10-CM | POA: Diagnosis not present

## 2017-09-04 DIAGNOSIS — I2699 Other pulmonary embolism without acute cor pulmonale: Principal | ICD-10-CM

## 2017-09-04 DIAGNOSIS — Z95828 Presence of other vascular implants and grafts: Secondary | ICD-10-CM

## 2017-09-04 DIAGNOSIS — R918 Other nonspecific abnormal finding of lung field: Secondary | ICD-10-CM | POA: Insufficient documentation

## 2017-09-04 DIAGNOSIS — R590 Localized enlarged lymph nodes: Secondary | ICD-10-CM | POA: Insufficient documentation

## 2017-09-04 DIAGNOSIS — R079 Chest pain, unspecified: Secondary | ICD-10-CM | POA: Diagnosis not present

## 2017-09-04 DIAGNOSIS — D630 Anemia in neoplastic disease: Secondary | ICD-10-CM | POA: Insufficient documentation

## 2017-09-04 DIAGNOSIS — K219 Gastro-esophageal reflux disease without esophagitis: Secondary | ICD-10-CM | POA: Insufficient documentation

## 2017-09-04 DIAGNOSIS — R531 Weakness: Secondary | ICD-10-CM | POA: Diagnosis not present

## 2017-09-04 LAB — URINALYSIS, ROUTINE W REFLEX MICROSCOPIC
Bilirubin Urine: NEGATIVE
Glucose, UA: NEGATIVE mg/dL
Hgb urine dipstick: NEGATIVE
Ketones, ur: NEGATIVE mg/dL
Leukocytes, UA: NEGATIVE
Nitrite: NEGATIVE
Protein, ur: NEGATIVE mg/dL
Specific Gravity, Urine: >1.046 — ABNORMAL HIGH (ref 1.005–1.030)
pH: 7 (ref 5.0–8.0)

## 2017-09-04 LAB — BASIC METABOLIC PANEL
ANION GAP: 12 (ref 5–15)
BUN: 9 mg/dL (ref 6–20)
CHLORIDE: 104 mmol/L (ref 101–111)
CO2: 22 mmol/L (ref 22–32)
Calcium: 8.8 mg/dL — ABNORMAL LOW (ref 8.9–10.3)
Creatinine, Ser: 0.45 mg/dL (ref 0.44–1.00)
GFR calc non Af Amer: >60 mL/min (ref >60)
GLUCOSE: 134 mg/dL — AB (ref 65–99)
Potassium: 3.9 mmol/L (ref 3.5–5.1)
Sodium: 138 mmol/L (ref 135–145)

## 2017-09-04 LAB — CBC
HCT: 26.1 % — ABNORMAL LOW (ref 36.0–46.0)
HEMOGLOBIN: 8 g/dL — AB (ref 12.0–15.0)
MCH: 25.2 pg — ABNORMAL LOW (ref 26.0–34.0)
MCHC: 30.7 g/dL (ref 30.0–36.0)
MCV: 82.1 fL (ref 78.0–100.0)
Platelets: 294 10*3/uL (ref 150–400)
RBC: 3.18 MIL/uL — AB (ref 3.87–5.11)
RDW: 19.2 % — ABNORMAL HIGH (ref 11.5–15.5)
WBC: 8.1 10*3/uL (ref 4.0–10.5)

## 2017-09-04 LAB — I-STAT TROPONIN, ED: Troponin i, poc: 0 ng/mL (ref 0.00–0.08)

## 2017-09-04 LAB — HEPATIC FUNCTION PANEL
ALBUMIN: 3.5 g/dL (ref 3.5–5.0)
ALT: 26 U/L (ref 14–54)
AST: 29 U/L (ref 15–41)
Alkaline Phosphatase: 89 U/L (ref 38–126)
Bilirubin, Direct: <0.1 mg/dL — ABNORMAL LOW (ref 0.1–0.5)
TOTAL PROTEIN: 7.8 g/dL (ref 6.5–8.1)
Total Bilirubin: 0.5 mg/dL (ref 0.3–1.2)

## 2017-09-04 LAB — GLUCOSE, CAPILLARY
Glucose-Capillary: 103 mg/dL — ABNORMAL HIGH (ref 65–99)
Glucose-Capillary: 69 mg/dL (ref 65–99)
Glucose-Capillary: 148 mg/dL — ABNORMAL HIGH (ref 65–99)

## 2017-09-04 LAB — I-STAT BETA HCG BLOOD, ED (MC, WL, AP ONLY): I-stat hCG, quantitative: <5 m[IU]/mL (ref <5)

## 2017-09-04 MED ORDER — ACETAMINOPHEN 650 MG RE SUPP: 650.0000 mg | Freq: Four times a day (QID) | RECTAL | Status: DC | PRN

## 2017-09-04 MED ORDER — VALACYCLOVIR HCL 500 MG PO TABS
1000.0000 mg | ORAL_TABLET | Freq: Three times a day (TID) | ORAL | Status: AC
  Administered 2017-09-04 – 2017-09-06 (×7): 1000 mg via ORAL
  Filled 2017-09-04 (×7): qty 2

## 2017-09-04 MED ORDER — VALSARTAN 80 MG PO TABS
80.0000 mg | ORAL_TABLET | Freq: Every day | ORAL | Status: DC
  Administered 2017-09-05 – 2017-09-07 (×3): 80 mg via ORAL
  Filled 2017-09-04 (×3): qty 1

## 2017-09-04 MED ORDER — HYDROMORPHONE HCL 1 MG/ML IJ SOLN
1.0000 mg | Freq: Once | INTRAMUSCULAR | Status: AC
  Administered 2017-09-04: 1 mg via INTRAVENOUS
  Filled 2017-09-04: qty 1

## 2017-09-04 MED ORDER — POLYETHYLENE GLYCOL 3350 17 G PO PACK: 17.0000 g | PACK | Freq: Every day | ORAL | Status: DC | PRN

## 2017-09-04 MED ORDER — ONDANSETRON HCL 4 MG/2ML IJ SOLN
4.0000 mg | Freq: Four times a day (QID) | INTRAMUSCULAR | Status: DC | PRN
  Administered 2017-09-05: 4 mg via INTRAVENOUS
  Filled 2017-09-04: qty 2

## 2017-09-04 MED ORDER — SODIUM CHLORIDE 0.9 % IV BOLUS
1000.0000 mL | Freq: Once | INTRAVENOUS | Status: AC
  Administered 2017-09-04: 1000 mL via INTRAVENOUS

## 2017-09-04 MED ORDER — ACETAMINOPHEN 325 MG PO TABS
650.0000 mg | ORAL_TABLET | Freq: Four times a day (QID) | ORAL | Status: DC | PRN
  Administered 2017-09-05: 650 mg via ORAL
  Filled 2017-09-04: qty 2

## 2017-09-04 MED ORDER — HYDROCHLOROTHIAZIDE 12.5 MG PO CAPS
12.5000 mg | ORAL_CAPSULE | Freq: Every day | ORAL | Status: DC
  Administered 2017-09-05: 12.5 mg via ORAL
  Filled 2017-09-04: qty 1

## 2017-09-04 MED ORDER — GADOBENATE DIMEGLUMINE 529 MG/ML IV SOLN
15.0000 mL | Freq: Once | INTRAVENOUS | Status: AC | PRN
  Administered 2017-09-04: 15 mL via INTRAVENOUS

## 2017-09-04 MED ORDER — ENOXAPARIN SODIUM 40 MG/0.4ML ~~LOC~~ SOLN
40.0000 mg | SUBCUTANEOUS | Status: DC
  Administered 2017-09-04: 40 mg via SUBCUTANEOUS
  Filled 2017-09-04: qty 0.4

## 2017-09-04 MED ORDER — MAGNESIUM CITRATE PO SOLN: 1.0000 | Freq: Once | ORAL | Status: DC | PRN

## 2017-09-04 MED ORDER — PROCHLORPERAZINE MALEATE 10 MG PO TABS: 10.0000 mg | ORAL_TABLET | Freq: Four times a day (QID) | ORAL | Status: DC | PRN

## 2017-09-04 MED ORDER — PANTOPRAZOLE SODIUM 40 MG PO TBEC
40.0000 mg | DELAYED_RELEASE_TABLET | Freq: Every day | ORAL | Status: DC
  Administered 2017-09-05 – 2017-09-07 (×3): 40 mg via ORAL
  Filled 2017-09-04 (×3): qty 1

## 2017-09-04 MED ORDER — INSULIN ASPART 100 UNIT/ML ~~LOC~~ SOLN
0.0000 [IU] | Freq: Three times a day (TID) | SUBCUTANEOUS | Status: DC
  Administered 2017-09-05: 1 [IU] via SUBCUTANEOUS
  Administered 2017-09-05 – 2017-09-06 (×3): 2 [IU] via SUBCUTANEOUS
  Administered 2017-09-07: 1 [IU] via SUBCUTANEOUS

## 2017-09-04 MED ORDER — GABAPENTIN 300 MG PO CAPS: 300.0000 mg | ORAL_CAPSULE | Freq: Three times a day (TID) | ORAL | Status: DC | PRN

## 2017-09-04 MED ORDER — ONDANSETRON HCL 4 MG PO TABS: 4.0000 mg | ORAL_TABLET | Freq: Four times a day (QID) | ORAL | Status: DC | PRN

## 2017-09-04 MED ORDER — FLUTICASONE FUROATE-VILANTEROL 100-25 MCG/INH IN AEPB
1.0000 | INHALATION_SPRAY | Freq: Every day | RESPIRATORY_TRACT | Status: DC
  Administered 2017-09-05 – 2017-09-06 (×2): 1 via RESPIRATORY_TRACT
  Filled 2017-09-04: qty 28

## 2017-09-04 MED ORDER — SODIUM CHLORIDE 0.9 % IV SOLN
INTRAVENOUS | Status: DC
  Administered 2017-09-04 – 2017-09-05 (×2): via INTRAVENOUS

## 2017-09-04 MED ORDER — VALSARTAN-HYDROCHLOROTHIAZIDE 80-12.5 MG PO TABS: 1.0000 | ORAL_TABLET | Freq: Every day | ORAL | Status: DC

## 2017-09-04 MED ORDER — IOPAMIDOL (ISOVUE-370) INJECTION 76%
INTRAVENOUS | Status: AC
  Filled 2017-09-04: qty 100

## 2017-09-04 MED ORDER — FLUTICASONE PROPIONATE 50 MCG/ACT NA SUSP
2.0000 | Freq: Two times a day (BID) | NASAL | Status: DC | PRN
Start: 2017-09-04 — End: 2017-09-07
  Filled 2017-09-04: qty 16

## 2017-09-04 MED ORDER — IOPAMIDOL (ISOVUE-370) INJECTION 76%
100.0000 mL | Freq: Once | INTRAVENOUS | Status: AC | PRN
  Administered 2017-09-04: 68 mL via INTRAVENOUS

## 2017-09-04 MED ORDER — OXYCODONE-ACETAMINOPHEN 5-325 MG PO TABS
1.0000 | ORAL_TABLET | Freq: Four times a day (QID) | ORAL | Status: DC | PRN
  Administered 2017-09-04 – 2017-09-05 (×3): 2 via ORAL
  Filled 2017-09-04 (×3): qty 2

## 2017-09-04 MED ORDER — SODIUM CHLORIDE 0.9% FLUSH: 10.0000 mL | INTRAVENOUS | Status: DC | PRN

## 2017-09-04 NOTE — ED Notes (Signed)
ED TO INPATIENT HANDOFF REPORT  Name/Age/Gender [name] 42 y.o. female  Code Status Code Status History    Date Active Date Inactive Code Status Order ID Comments User Context   12/31/2015 2253 01/02/2016 1100 Full Code [id number]  Ivor Costa, MD ED      Home/SNF/Other Home  Chief Complaint chest pain (Chemo Card)  Level of Care/Admitting Diagnosis ED Disposition    ED Disposition Condition [facility] Area: [facility] [[id number]]  Level of Care: Telemetry [5]  Admit to tele based on following criteria: Eval of Syncope  Diagnosis: Pulmonary embolism ([place]) [[id number]]  Admitting Physician: Cristy Folks [[id number]]  Attending Physician: Cristy Folks [[id number]]  PT Class (Do Not Modify): Observation [104]  PT Acc Code (Do Not Modify): Observation [10022]       Medical History Past Medical History:  Diagnosis Date  . Anxiety    gets sweaty and short of breath  . Breast cancer ([place]) 01/20/14   triple positive  . Diabetes   . GERD (gastroesophageal reflux disease)    does not take anything  . History of radiation therapy 11/03/14- 12/14/14   Right Breast, supraclavicular nodes, axillary nodes, and internal mammary nodes.  . History of radiation therapy 01/04/16- 01/23/16   Spine metastasis L2-S2  . Hypertension   . Liver metastases ([place])   . Metastatic cancer to bone ([place])   . Shortness of breath    'anxiety'    Allergies Allergies  Allergen Reactions  . Lisinopril Swelling and Cough    Swelling under eyes and eyelids    . Tamoxifen Itching    IV Location/Drains/Wounds Patient Lines/Drains/Airways Status   Active Line/Drains/Airways    Name:   Placement date:   Placement time:   Site:   Days:   Implanted Port 01/27/16 Left Chest   01/27/16    1045    Chest   586   Incision (Closed) 01/26/14 Neck Left   01/26/14    0943     1317   Incision (Closed) 08/23/14 Breast Right   08/23/14    1201     1108   Incision (Closed)  09/15/14 Breast Right   09/15/14    1159     1085          Labs/Imaging Results for orders placed or performed during the hospital encounter of 09/04/17 (from the past 48 hour(s))  Basic metabolic panel     Status: Abnormal   Collection Time: 09/04/17 12:00 PM  Result Value Ref Range   Sodium 138 135 - 145 mmol/L   Potassium 3.9 3.5 - 5.1 mmol/L   Chloride 104 101 - 111 mmol/L   CO2 22 22 - 32 mmol/L   Glucose, Bld 134 (H) 65 - 99 mg/dL   BUN 9 6 - 20 mg/dL   Creatinine, Ser 0.45 0.44 - 1.00 mg/dL   Calcium 8.8 (L) 8.9 - 10.3 mg/dL   GFR calc non Af Amer >60 >60 mL/min   GFR calc Af Amer >60 >60 mL/min    Comment: (NOTE) The eGFR has been calculated using the CKD EPI equation. This calculation has not been validated in all clinical situations. eGFR's persistently <60 mL/min signify possible Chronic Kidney Disease.    Anion gap 12 5 - 15    Comment: Performed at [facility], [place] [address] [place], [place] [address]  CBC     Status: Abnormal   Collection Time: 09/04/17 12:00  PM  Result Value Ref Range   WBC 8.1 4.0 - 10.5 K/uL   RBC 3.18 (L) 3.87 - 5.11 MIL/uL   Hemoglobin 8.0 (L) 12.0 - 15.0 g/dL   HCT 26.1 (L) 36.0 - 46.0 %   MCV 82.1 78.0 - 100.0 fL   MCH 25.2 (L) 26.0 - 34.0 pg   MCHC 30.7 30.0 - 36.0 g/dL   RDW 19.2 (H) 11.5 - 15.5 %   Platelets 294 150 - 400 K/uL    Comment: Performed at [facility], [place] [address], [place], [place] [address]  Hepatic function panel     Status: Abnormal   Collection Time: 09/04/17 12:00 PM  Result Value Ref Range   Total Protein 7.8 6.5 - 8.1 g/dL   Albumin 3.5 3.5 - 5.0 g/dL   AST 29 15 - 41 U/L   ALT 26 14 - 54 U/L   Alkaline Phosphatase 89 38 - 126 U/L   Total Bilirubin 0.5 0.3 - 1.2 mg/dL   Bilirubin, Direct <0.1 (L) 0.1 - 0.5 mg/dL   Indirect Bilirubin NOT CALCULATED 0.3 - 0.9 mg/dL    Comment: Performed at [facility], [place] [address] [place], [place]  [address]  I-stat troponin, ED     Status: None   Collection Time: 09/04/17 12:07 PM  Result Value Ref Range   Troponin i, poc 0.00 0.00 - 0.08 ng/mL   Comment 3            Comment: Due to the release kinetics of cTnI, a negative result within the first hours of the onset of symptoms does not rule out myocardial infarction with certainty. If myocardial infarction is still suspected, repeat the test at appropriate intervals.   I-Stat beta hCG blood, ED     Status: None   Collection Time: 09/04/17 12:08 PM  Result Value Ref Range   I-stat hCG, quantitative <5.0 <5 mIU/mL   Comment 3            Comment:   GEST. AGE      CONC.  (mIU/mL)   <=1 WEEK        5 - 50     2 WEEKS       50 - 500     3 WEEKS       100 - 10,000     4 WEEKS     1,000 - 30,000        FEMALE AND NON-PREGNANT FEMALE:     LESS THAN 5 mIU/mL   Urinalysis, Routine w reflex microscopic     Status: Abnormal   Collection Time: 09/04/17  3:09 PM  Result Value Ref Range   Color, Urine YELLOW YELLOW   APPearance CLEAR CLEAR   Specific Gravity, Urine >1.046 (H) 1.005 - 1.030   pH 7.0 5.0 - 8.0   Glucose, UA NEGATIVE NEGATIVE mg/dL   Hgb urine dipstick NEGATIVE NEGATIVE   Bilirubin Urine NEGATIVE NEGATIVE   Ketones, ur NEGATIVE NEGATIVE mg/dL   Protein, ur NEGATIVE NEGATIVE mg/dL   Nitrite NEGATIVE NEGATIVE   Leukocytes, UA NEGATIVE NEGATIVE    Comment: Performed at [facility], [place] [address] [place],  [address]   Dg Chest 2 View  Result Date: 09/04/2017 CLINICAL DATA:  Chest pain, weakness, fever EXAM: CHEST - 2 VIEW COMPARISON:  Chest CT 09/02/2017 FINDINGS: Left Port-A-Cath in place with the tip in the SVC. Heart is normal size. No confluent airspace opacities or effusions. No  acute bony abnormality. IMPRESSION: No active cardiopulmonary disease. Electronically Signed   By: Rolm Baptise M.D.   On: 09/04/2017 11:09   Ct Angio Chest Pe W And/or Wo Contrast  Result Date: 09/04/2017 CLINICAL  DATA:  Chest pain.  History of breast carcinoma EXAM: CT ANGIOGRAPHY CHEST WITH CONTRAST TECHNIQUE: Multidetector CT imaging of the chest was performed using the standard protocol during bolus administration of intravenous contrast. Multiplanar CT image reconstructions and MIPs were obtained to evaluate the vascular anatomy. CONTRAST:  54m ISOVUE-370 IOPAMIDOL (ISOVUE-370) INJECTION 76% COMPARISON:  Chest CT Sep 02, 2017 and chest radiograph Sep 04, 2017 FINDINGS: Cardiovascular: There are pulmonary emboli in several subsegmental branches of the right lower lobe pulmonary artery. No more central pulmonary embolus evident. No evidence of right heart strain. No thoracic aortic aneurysm or dissection. The visualized great vessels appear unremarkable. No pericardial effusion or pericardial thickening is evident. Port-A-Cath tip is in the superior vena cava near the cavoatrial junction. Mediastinum/Nodes: Visualized thyroid appears normal. There are scattered subcentimeter mediastinal lymph nodes. There is a subcarinal lymph node measuring 1.7 x 1.1 cm. No other lymph node enlargement noted. No change in mediastinal lymph nodes compared to 2 days prior. No esophageal lesions are evident. Lungs/Pleura: There is subpleural consolidation in the periphery of the right middle lobe, stable. There is a focal nodular lesion in the medial segment right middle lobe measuring 7 x 7 mm, stable. This nodular opacity is best seen on axial slice 57 series 7 and is stable compared to 2 days prior. No new areas of airspace consolidation are identified. No pleural effusion or pleural thickening is evident. There is again noted a 2 mm nodular opacity in the periphery of the posterior segment of the left upper lobe near the major fissure. Upper Abdomen: Widespread hepatic metastatic disease again noted. Musculoskeletal: Widespread sclerotic bony metastases again noted. Postoperative change noted in right breast, stable. Review of the MIP  images confirms the above findings. IMPRESSION: 1. Small peripheral pulmonary emboli in the right lower lobe pulmonary artery. No more central pulmonary emboli. No right heart strain evident. 2.  Widespread hepatic and bony metastatic disease. 3. Stable consolidation periphery of right middle lobe, unchanged from 2 days prior. Subcentimeter nodular opacities as well as mild adenopathy unchanged from 2 days prior. 4.  Stable postoperative change right breast. Critical Value/emergent results were called by telephone at the time of interpretation on 09/04/2017 at 12:46 pm to Dr. CMarda Stalker, who verbally acknowledged these results. Electronically Signed   By: WLowella GripIII M.D.   On: 09/04/2017 12:48    Pending Labs [employer](From admission, onward)   Start     Ordered   Signed and Held  HIV antibody (Routine Testing)  Once,   R     Signed and Held   Signed and Held  Basic metabolic panel  Tomorrow morning,   R     Signed and Held   Signed and Held  CBC  Tomorrow morning,   R     Signed and Held      Vitals/Pain Today's Vitals   09/04/17 1246 09/04/17 1400 09/04/17 1430 09/04/17 1544  BP:  111/71 123/82   Pulse:  (!) 104 (!) 106   Resp:  17 (!) 22   Temp:      TempSrc:      SpO2:  96% 100%   Weight:      Height:      PainSc: 5  0-No pain    Isolation Precautions No active isolations  Medications Medications  iopamidol (ISOVUE-370) 76 % injection (has no administration in time range)  HYDROmorphone (DILAUDID) injection 1 mg (1 mg Intravenous Given 09/04/17 1158)  sodium chloride 0.9 % bolus 1,000 mL (0 mLs Intravenous Stopped 09/04/17 1340)  iopamidol (ISOVUE-370) 76 % injection 100 mL (68 mLs Intravenous Contrast Given 09/04/17 1215)  sodium chloride 0.9 % bolus 1,000 mL (0 mLs Intravenous Stopped 09/04/17 1543)  HYDROmorphone (DILAUDID) injection 1 mg (1 mg Intravenous Given 09/04/17 1449)  gadobenate dimeglumine (MULTIHANCE) injection 15 mL (15 mLs  Intravenous Contrast Given 09/04/17 1636)    Mobility walks

## 2017-09-04 NOTE — Telephone Encounter (Signed)
Received call from pt stating that she has chest pain under L breast that is very painful.  She rates #9-10 on scale of 1-10 & states that it hurts to breath.  She reports sleeping with a heating pad last night & sleeping on the opposite side.  Pt denies any cardiac history. She is home alone & would have to call someone at work to take her to [place].  Instructed to call 911 & get to ED.  Notified Dr Rolla Plate RN

## 2017-09-04 NOTE — ED Notes (Addendum)
Pt in MRI.

## 2017-09-04 NOTE — ED Triage Notes (Signed)
Pt reports that yesterday started having right sided chest pains that felt like muscle spasm. Then last night and this morning having left side chest pains that are very sore and hurts when laying on that side or taking deep breath. Pt reports that she has dry cough that started today.  Pt has breast cancer but they canceled her chemo appt yesterday.

## 2017-09-04 NOTE — ED Notes (Signed)
Pt resting at present time awaiting for test results and update from EDP

## 2017-09-04 NOTE — ED Notes (Signed)
Attempted to call report, RN unavailable.  Pt still in MRI.

## 2017-09-04 NOTE — Telephone Encounter (Signed)
Received VM from triage regarding pt having chest pain under left breast last night into today- tried heating pad, didn't help, rates 9-10 on pain scale- per triage, advised pt to go to ER, pt alone, advised to call 911.    I notified Dr Lindi Adie of triage's VM, he had a recent appt with pt and would like me to call pt and offer her seeing Sandi Mealy PA in symptom management.  Ongoing call to pt, no answer, left her a VM on her personal VM and let her know about the symptom management option but that if she is already at ER then give [place] an update if possible later in the day, left our contact info.

## 2017-09-04 NOTE — H&P (Signed)
History and Physical    Debbie Martinez IRC:[id number] DOB: 1976/03/09 DOA: 09/04/2017  PCP: Lucianne Lei, MD   Patient coming from: home    Chief Complaint: Chest pain  HPI: Debbie Martinez is a 42 y.o. female with medical history significant of stage IV widely metastatic breast cancer with metastatic disease to multiple multiple bones as well as liver, mediastinum, abdomen, asthma, type 2 diabetes, hypertension who comes in with chest pain and is found to have a pulmonary embolism.  In brief patient recently completed 3 cycles of eribulin as treatment for metastatic breast cancer and unfortunately appears to have progressed on it.  Decision was made on 09/03/2017 to switch over to single agent platinum based therapy.  Since yesterday however at night she began to have an episode of chest pain.  She prescription the chest pain is squeezing and primarily right-sided.  It lasted for several seconds and then resolved.  It came on quite suddenly.  She is had chronic dyspnea and shortness of breath for some time however she reported this appeared to somewhat worsened yesterday.  She also had some slight worsening of her chronic cough.  She denies any orthopnea.  She did have one brief episode of presyncope however no frank syncope or vertigo.  She denies any fevers, congestion, rhinorrhea, abdominal pain, diarrhea, nausea, vomiting.  She has chronic bone pain from her numerous numerous metastatic lesions.  ED Course: In the ED patient was noted to be mildly tachycardic in the 100s to 110.  Her other vitals were reassuring.  CBC showed a hemoglobin of 8.0 with normal white count and platelets.  Chest x-ray was normal.  CTA showed small peripheral PE of the right lower lobe with no central PE and no evidence of right heart strain.  Wide metastatic and bony disease was noted.  Review of Systems: As per HPI otherwise 10 point review of systems negative.    Past Medical History:  Diagnosis Date  .  Anxiety    gets sweaty and short of breath  . Breast cancer ([place]) 01/20/14   triple positive  . Diabetes   . GERD (gastroesophageal reflux disease)    does not take anything  . History of radiation therapy 11/03/14- 12/14/14   Right Breast, supraclavicular nodes, axillary nodes, and internal mammary nodes.  . History of radiation therapy 01/04/16- 01/23/16   Spine metastasis L2-S2  . Hypertension   . Liver metastases ([place])   . Metastatic cancer to bone ([place])   . Shortness of breath    'anxiety'    Past Surgical History:  Procedure Laterality Date  . BREAST LUMPECTOMY WITH NEEDLE LOCALIZATION AND AXILLARY SENTINEL LYMPH NODE BX Right 08/23/2014   Procedure: RIGHT BREAST LUMPECTOMY WITH NEEDLE LOCALIZATION(X'S 2)AND RIGHT AXILLARY SENTINEL LYMPH NODE Biopies;  Surgeon: Autumn Messing III, MD;  Location: [facility] [facility];  Service: General;  Laterality: Right;  . CESAREAN SECTION     2008  . KELOID EXCISION Left    ear  . PORTACATH PLACEMENT Left 01/26/2014   Procedure: INSERTION PORT-A-CATH;  Surgeon: Autumn Messing III, MD;  Location: [facility] [place];  Service: General;  Laterality: Left;  . RE-EXCISION OF BREAST CANCER,SUPERIOR MARGINS Right 09/15/2014   Procedure: RE-EXCISION OF RIGHT BREAST CANCER,SUPERIOR AND INFERIOR MARGINS;  Surgeon: Autumn Messing III, MD;  Location: [place] [facility];  Service: General;  Laterality: Right;     reports that she has never smoked. She has never used smokeless tobacco. She reports that she drinks alcohol. She  reports that she does not use drugs.  Allergies  Allergen Reactions  . Lisinopril Swelling and Cough    Swelling under eyes and eyelids    . Tamoxifen Itching    Family History  Problem Relation Age of Onset  . Heart attack Father 14  . Lupus Maternal Aunt   . Prostate cancer Maternal Grandfather 74  . Dementia Paternal Grandmother   . Leukemia Cousin 61       paternal first cousin    Prior to Admission medications   Medication Sig Start Date End Date  Taking? Authorizing Provider  acetaminophen (TYLENOL) 500 MG tablet Take 1,000 mg by mouth every 4 (four) hours as needed for moderate pain.   Yes [provider]  Cholecalciferol (VITAMIN D-3 PO) Take 1 capsule by mouth daily.   Yes [provider]  ENSURE (ENSURE) Take 237 mLs by mouth 2 (two) times daily between meals.    Yes [provider]  fluticasone (FLONASE) 50 MCG/ACT nasal spray Place 2 sprays into both nostrils 2 (two) times daily as needed for allergies. 08/05/17  Yes [provider]  Fluticasone Furoate-Vilanterol (BREO ELLIPTA IN) Inhale 1 puff into the lungs daily as needed (Shortness of breath).   Yes [provider]  gabapentin (NEURONTIN) 300 MG capsule Take 1 capsule (300 mg total) by mouth 3 (three) times daily. Patient taking differently: Take 300 mg by mouth 3 (three) times daily as needed (pain).  05/30/16  Yes [name], MD  IRON PO Take 1 tablet by mouth 2 (two) times daily.   Yes [provider]  lidocaine-prilocaine (EMLA) cream Apply to affected area once 09/03/17  Yes Gudena, Loleta Dicker, MD  Lidocaine-Prilocaine, Bulk, 2.5-2.5 % CREA Apply 5 mLs topically as needed. 2 hours before use, cover with plawtic wrap. 05/16/17  Yes [name], MD  omeprazole (PRILOSEC) 20 MG capsule Take 1 capsule (20 mg total) by mouth daily. Take while using dexamethasone to prevent heartburn. Patient taking differently: Take 20 mg by mouth daily as needed (Heartburn).  01/09/16  Yes [name], MD  oxyCODONE-acetaminophen (PERCOCET/ROXICET) 5-325 MG tablet Take 1 tablet by mouth every 6 (six) hours as needed for severe pain. Patient taking differently: Take 1-2 tablets by mouth every 6 (six) hours as needed for severe pain.  08/20/17  Yes [name], MD  polyethylene glycol (MIRALAX / GLYCOLAX) packet Take 17 g by mouth daily. Patient taking differently: Take 17 g by mouth daily as needed for mild constipation.  01/05/16  Yes Charlynne Cousins, MD  valACYclovir (VALTREX) 1000 MG tablet Take 1 tablet (1,000 mg total) by mouth 3 (three) times daily. 08/27/17  Yes Tanner, Lyndon Code., PA-C  valsartan-hydrochlorothiazide (DIOVAN-HCT) 80-12.5 MG tablet Take 1 tablet by mouth daily. 08/21/17  Yes [provider]  ACCU-CHEK AVIVA PLUS test strip USE AS DIRECTED 4 TIMES A DAY 12/27/16   [name], MD  ACCU-CHEK SOFTCLIX LANCETS lancets 100 each by Other route 4 (four) times daily. Use as instructed 11/12/16   [name], MD  blood glucose meter kit and supplies KIT Dispense based on patient and insurance preference. Use up to four times daily as directed. (FOR ICD-9 250.00, 250.01). 01/12/16   [name], MD  eriBULin Mesylate (HALAVEN IV) Inject into the vein. Injection given at [facility]    [provider]  Iron-Vitamins (GERITOL) LIQD Take 5 mLs by mouth daily.    [provider]  JANUMET 50-500 MG tablet Take 1 tablet by mouth  daily. 07/10/17   [provider]  LORazepam (ATIVAN) 0.5 MG tablet Take 1 tablet (0.5 mg total) by mouth at bedtime as needed (Nausea or vomiting). 09/03/17   [name], MD  ondansetron (ZOFRAN) 8 MG tablet Take 1 tablet (8 mg total) by mouth 2 (two) times daily as needed for refractory nausea / vomiting. Start on day 3 after chemo. 09/03/17   [name], MD  prochlorperazine (COMPAZINE) 10 MG tablet Take 1 tablet (10 mg total) by mouth every 6 (six) hours as needed (Nausea or vomiting). 09/03/17   [name], MD    Physical Exam: Vitals:   09/04/17 1024 09/04/17 1026 09/04/17 1230 09/04/17 1400  BP:  (!) 144/96 134/66 111/71  Pulse:  (!) 115 (!) 113 (!) 104  Resp:  16 (!) 21 17  Temp:  99 F (37.2 C)    TempSrc:  Oral    SpO2:  100% 95% 96%  Weight: 75.3 kg (166 lb)     Height: _0  (1.702 m)       Constitutional: NAD, calm, comfortable Vitals:   09/04/17 1024 09/04/17 1026 09/04/17 1230 09/04/17 1400  BP:  (!) 144/96 134/66 111/71  Pulse:  (!) 115 (!)  113 (!) 104  Resp:  16 (!) 21 17  Temp:  99 F (37.2 C)    TempSrc:  Oral    SpO2:  100% 95% 96%  Weight: 75.3 kg (166 lb)     Height: _1  (1.702 m)      Eyes: Anicteric sclera ENMT: Mucous membranes, normal dentition.  Neck: normal, supple Respiratory: Clear to auscultation bilaterally no crackles, wheezes, rales Cardiovascular: Tachycardia, regular rhythm, no murmurs Abdomen: no tenderness, no masses palpated. No hepatosplenomegaly. Bowel sounds positive.  Musculoskeletal: 1+ lower extremity edema Skin: no rashes on visible skin Neurologic: Fully intact moving all extremities Psychiatric: Normal judgment and insight. Alert and oriented x 3. Normal mood.    Labs on Admission: I have personally reviewed following labs and imaging studies  CBC: Recent Labs  Lab 09/03/17 1108 09/04/17 1200  WBC 5.5 8.1  NEUTROABS 4.4  --   HGB 8.2* 8.0*  HCT 25.4* 26.1*  MCV 80.2 82.1  PLT 263 470   Basic Metabolic Panel: Recent Labs  Lab 09/03/17 1108 09/04/17 1200  NA 137 138  K 3.9 3.9  CL 102 104  CO2 26 22  GLUCOSE 261* 134*  BUN 9 9  CREATININE 0.71 0.45  CALCIUM 9.3 8.8*   GFR: Estimated Creatinine Clearance: 98 mL/min (by C-G formula based on SCr of 0.45 mg/dL). Liver Function Tests: Recent Labs  Lab 09/03/17 1108 09/04/17 1200  AST 23 29  ALT 22 26  ALKPHOS 104 89  BILITOT 0.3 0.5  PROT 7.9 7.8  ALBUMIN 3.5 3.5   No results for input(s): LIPASE, AMYLASE in the last 168 hours. No results for input(s): AMMONIA in the last 168 hours. Coagulation Profile: No results for input(s): INR, PROTIME in the last 168 hours. Cardiac Enzymes: No results for input(s): CKTOTAL, CKMB, CKMBINDEX, TROPONINI in the last 168 hours. BNP (last 3 results) No results for input(s): PROBNP in the last 8760 hours. HbA1C: No results for input(s): HGBA1C in the last 72 hours. CBG: No results for input(s): GLUCAP in the last 168 hours. Lipid Profile: No results for input(s): CHOL,  HDL, LDLCALC, TRIG, CHOLHDL, LDLDIRECT in the last 72 hours. Thyroid Function Tests: No results for input(s): TSH, T4TOTAL, FREET4, T3FREE, THYROIDAB in the last 72 hours. Anemia  Panel: No results for input(s): VITAMINB12, FOLATE, FERRITIN, TIBC, IRON, RETICCTPCT in the last 72 hours. Urine analysis:    Component Value Date/Time   COLORURINE YELLOW 09/04/2017 [place] 09/04/2017 1509   LABSPEC >1.046 (H) 09/04/2017 1509   PHURINE 7.0 09/04/2017 [place] 09/04/2017 1509   HGBUR NEGATIVE 09/04/2017 [place] 09/04/2017 1509   KETONESUR NEGATIVE 09/04/2017 1509   PROTEINUR NEGATIVE 09/04/2017 1509   NITRITE NEGATIVE 09/04/2017 1509   LEUKOCYTESUR NEGATIVE 09/04/2017 1509    Radiological Exams on Admission: Dg Chest 2 View  Result Date: 09/04/2017 CLINICAL DATA:  Chest pain, weakness, fever EXAM: CHEST - 2 VIEW COMPARISON:  Chest CT 09/02/2017 FINDINGS: Left Port-A-Cath in place with the tip in the SVC. Heart is normal size. No confluent airspace opacities or effusions. No acute bony abnormality. IMPRESSION: No active cardiopulmonary disease. Electronically Signed   By: Rolm Baptise M.D.   On: 09/04/2017 11:09   Ct Angio Chest Pe W And/or Wo Contrast  Result Date: 09/04/2017 CLINICAL DATA:  Chest pain.  History of breast carcinoma EXAM: CT ANGIOGRAPHY CHEST WITH CONTRAST TECHNIQUE: Multidetector CT imaging of the chest was performed using the standard protocol during bolus administration of intravenous contrast. Multiplanar CT image reconstructions and MIPs were obtained to evaluate the vascular anatomy. CONTRAST:  21m ISOVUE-370 IOPAMIDOL (ISOVUE-370) INJECTION 76% COMPARISON:  Chest CT Sep 02, 2017 and chest radiograph Sep 04, 2017 FINDINGS: Cardiovascular: There are pulmonary emboli in several subsegmental branches of the right lower lobe pulmonary artery. No more central pulmonary embolus evident. No evidence of right heart strain. No  thoracic aortic aneurysm or dissection. The visualized great vessels appear unremarkable. No pericardial effusion or pericardial thickening is evident. Port-A-Cath tip is in the superior vena cava near the cavoatrial junction. Mediastinum/Nodes: Visualized thyroid appears normal. There are scattered subcentimeter mediastinal lymph nodes. There is a subcarinal lymph node measuring 1.7 x 1.1 cm. No other lymph node enlargement noted. No change in mediastinal lymph nodes compared to 2 days prior. No esophageal lesions are evident. Lungs/Pleura: There is subpleural consolidation in the periphery of the right middle lobe, stable. There is a focal nodular lesion in the medial segment right middle lobe measuring 7 x 7 mm, stable. This nodular opacity is best seen on axial slice 57 series 7 and is stable compared to 2 days prior. No new areas of airspace consolidation are identified. No pleural effusion or pleural thickening is evident. There is again noted a 2 mm nodular opacity in the periphery of the posterior segment of the left upper lobe near the major fissure. Upper Abdomen: Widespread hepatic metastatic disease again noted. Musculoskeletal: Widespread sclerotic bony metastases again noted. Postoperative change noted in right breast, stable. Review of the MIP images confirms the above findings. IMPRESSION: 1. Small peripheral pulmonary emboli in the right lower lobe pulmonary artery. No more central pulmonary emboli. No right heart strain evident. 2.  Widespread hepatic and bony metastatic disease. 3. Stable consolidation periphery of right middle lobe, unchanged from 2 days prior. Subcentimeter nodular opacities as well as mild adenopathy unchanged from 2 days prior. 4.  Stable postoperative change right breast. Critical Value/emergent results were called by telephone at the time of interpretation on 09/04/2017 at 12:46 pm to Dr. CMarda Stalker, who verbally acknowledged these results. Electronically Signed    By: WLowella GripIII M.D.   On: 09/04/2017 12:48    EKG: Independently reviewed.  Sinus tachycardia, no acute  ST segment changes, unchanged from prior, very poor R wave progression and early precordial leads  Assessment/Plan Principal Problem:   Pulmonary embolism (HCC) Active Problems:   Breast cancer of upper-inner quadrant of right female breast ([place])   Breast carcinoma metastatic to multiple sites [facility])   Hypertension   Anxiety   Bone metastasis ([place])   Diabetes ([place])   Port catheter in place    #) Pulmonary embolism: Not tremendously surprising with her incredible cancer load.  It is not clear that her symptoms are necessarily related to this PE however this would be the most likely cause for her chest pain.  Regardless he will need to be treated. -Obtain echo -MRI brain ordered to rule out metastatic disease -We will start enoxaparin 1 mg/kg every 12 if MRI brain with and without contrast is normal, if metastatic disease is present in the brain then will discuss with oncology about the risks and benefits of anticoagulation as this pulmonary embolism is quite small.  Of note another option for anticoagulation that would be quite reasonable would be edoxaban if her insurance company would pay for it -DVT lower extremity ultrasound bilaterally -IV fluids  #) Asthma: -Continue LABA/ICS  #) Hypertension: -Continue valsartan 80 mg daily -Continue HCTZ 12.76m daily  #) Type 2 diabetes: -Sliding scale insulin, AC at bedtime -Hold sitagliptin and metformin  #) Widely metastatic breast cancer: -MRI per above - Continue valacyclovir 1000 mg 3 times daily  #) Pain/psych: -Continue gabapentin 300 mg 3 times daily as needed - Continue PRN oxycodone-acetaminophen  Fluids: Gentle IV fluids Electrodes: Monitor and supplement Nutrition: Carb/heart healthy diet  Prophylaxis: Reflexes dose enoxaparin, will transition to treatment dose depending on MRI  Full code  SCristy FolksMD [facility]   If 7PM-7AM, please contact night-coverage [email or web address] Password [email or web address] 09/04/2017, 3:33 PM

## 2017-09-04 NOTE — ED Notes (Signed)
ASSESSMENT: pt with cancer and mets, having increased pain in chest and back. Pt has spasms in her mid back relieved a little with massage and heat.

## 2017-09-04 NOTE — ED Notes (Signed)
Pt returned from MRI °

## 2017-09-04 NOTE — ED Provider Notes (Signed)
[facility] DEPT Provider Note   CSN: [id number] Arrival date & time: 09/04/17  1015     History   Chief Complaint Chief Complaint  Patient presents with  . Chest Pain    HPI Debbie Martinez is a 42 y.o. female.  The history is provided by the patient and medical records. No language interpreter was used.  Chest Pain   This is a new problem. The current episode started yesterday. The problem occurs constantly. The problem has not changed since onset.The pain is associated with breathing and coughing. The pain is present in the lateral region and substernal region. The pain is at a severity of 9/10. The pain is severe. The quality of the pain is described as heavy, pressure-like and sharp. The pain does not radiate. Duration of episode(s) is 2 days. The symptoms are aggravated by deep breathing. Associated symptoms include cough, leg pain (chronic) and shortness of breath. Pertinent negatives include no abdominal pain, no back pain, no diaphoresis, no exertional chest pressure, no fever, no headaches, no lower extremity edema, no malaise/fatigue, no nausea, no near-syncope, no numbness, no palpitations, no sputum production, no syncope and no vomiting. She has tried nothing for the symptoms. The treatment provided no relief.  Her past medical history is significant for cancer.    Past Medical History:  Diagnosis Date  . Anxiety    gets sweaty and short of breath  . Breast cancer ([place]) 01/20/14   triple positive  . Diabetes   . GERD (gastroesophageal reflux disease)    does not take anything  . History of radiation therapy 11/03/14- 12/14/14   Right Breast, supraclavicular nodes, axillary nodes, and internal mammary nodes.  . History of radiation therapy 01/04/16- 01/23/16   Spine metastasis L2-S2  . Hypertension   . Liver metastases ([place])   . Metastatic cancer to bone ([place])   . Shortness of breath    'anxiety'    Patient Active Problem List   Diagnosis Date Noted  . Port catheter in place 12/25/2016  . Diabetes ([place]) 11/12/2016  . Goals of care, counseling/discussion 02/13/2016  . Nausea without vomiting 01/27/2016  . Cancer associated pain 01/27/2016  . Dehydration 01/27/2016  . Cord compression ([place]) 01/14/2016  . Bone metastasis ([place]) 01/14/2016  . Breast carcinoma metastatic to multiple sites ([place]) 12/31/2015  . Back pain 12/31/2015  . Hypertension 12/31/2015  . Anxiety 12/31/2015  . Genetic testing 02/25/2015  . Chemotherapy induced nausea and vomiting 03/25/2014  . 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 Constipation 03/25/2014  . Breast cancer of upper-inner quadrant of right female breast ([place]) 01/15/2014    Past Surgical History:  Procedure Laterality Date  . BREAST LUMPECTOMY WITH NEEDLE LOCALIZATION AND AXILLARY SENTINEL LYMPH NODE BX Right 08/23/2014   Procedure: RIGHT BREAST LUMPECTOMY WITH NEEDLE LOCALIZATION(X'S 2)AND RIGHT AXILLARY SENTINEL LYMPH NODE Biopies;  Surgeon: Autumn Messing III, MD;  Location: [facility];  Service: General;  Laterality: Right;  . CESAREAN SECTION     2008  . KELOID EXCISION Left    ear  . PORTACATH PLACEMENT Left 01/26/2014   Procedure: INSERTION PORT-A-CATH;  Surgeon: Autumn Messing III, MD;  Location: [facility];  Service: General;  Laterality: Left;  . RE-EXCISION OF BREAST CANCER,SUPERIOR MARGINS Right 09/15/2014   Procedure: RE-EXCISION OF RIGHT BREAST CANCER,SUPERIOR AND INFERIOR MARGINS;  Surgeon: Autumn Messing III, MD;  Location: [facility];  Service: General;  Laterality: Right;     OB History   None  Home Medications    Prior to Admission medications   Medication Sig Start Date End Date Taking? Authorizing Provider  ACCU-CHEK AVIVA PLUS test strip USE AS DIRECTED 4 TIMES A DAY 12/27/16   [name], MD  ACCU-CHEK SOFTCLIX LANCETS lancets 100 each by Other route 4 (four) times daily. Use as instructed 11/12/16   [name], MD  acetaminophen  (TYLENOL) 500 MG tablet Take 1,000 mg by mouth every 4 (four) hours as needed for moderate pain.    [provider]  blood glucose meter kit and supplies KIT Dispense based on patient and insurance preference. Use up to four times daily as directed. (FOR ICD-9 250.00, 250.01). 01/12/16   [name], MD  Cholecalciferol (VITAMIN D-3 PO) Take 1 capsule by mouth daily.    [provider]  diazepam (VALIUM) 5 MG tablet Take 1 tablet (5 mg total) by mouth 2 (two) times daily. Patient not taking: Reported on 03/14/2016 10/06/15   Nona Dell, PA-C  ENSURE (ENSURE) Take 237 mLs by mouth 2 (two) times daily between meals.     [provider]  eriBULin Mesylate (HALAVEN IV) Inject into the vein. Injection given at [facility]    [provider]  fluticasone (FLONASE) 50 MCG/ACT nasal spray Place 2 sprays into both nostrils 2 (two) times daily as needed for allergies. 08/05/17   [provider]  gabapentin (NEURONTIN) 300 MG capsule Take 1 capsule (300 mg total) by mouth 3 (three) times daily. 05/30/16   [name], MD  Iron-Vitamins (GERITOL) LIQD Take 5 mLs by mouth daily.    [provider]  JANUMET 50-500 MG tablet Take 1 tablet by mouth daily. 07/10/17   [provider]  lidocaine-prilocaine (EMLA) cream Apply to affected area once 09/03/17   [name], MD  Lidocaine-Prilocaine, Bulk, 2.5-2.5 % CREA Apply 5 mLs topically as needed. 2 hours before use, cover with plawtic wrap. 05/16/17   [name], MD  LORazepam (ATIVAN) 0.5 MG tablet Take 1 tablet (0.5 mg total) by mouth at bedtime as needed (Nausea or vomiting). 09/03/17   [name], MD  omeprazole (PRILOSEC) 20 MG capsule Take 1 capsule (20 mg total) by mouth daily. Take while using dexamethasone to prevent heartburn. 01/09/16   [name], MD  ondansetron (ZOFRAN) 8 MG tablet Take 1 tablet (8 mg total) by mouth 2 (two) times daily as needed for refractory nausea /  vomiting. Start on day 3 after chemo. 09/03/17   [name], MD  oxyCODONE-acetaminophen (PERCOCET/ROXICET) 5-325 MG tablet Take 1 tablet by mouth every 6 (six) hours as needed for severe pain. 08/20/17   [name], MD  polyethylene glycol (MIRALAX / GLYCOLAX) packet Take 17 g by mouth daily. 01/05/16   Charlynne Cousins, MD  prochlorperazine (COMPAZINE) 10 MG tablet Take 1 tablet (10 mg total) by mouth every 6 (six) hours as needed (Nausea or vomiting). 09/03/17   [name], MD  valACYclovir (VALTREX) 1000 MG tablet Take 1 tablet (1,000 mg total) by mouth 3 (three) times daily. 08/27/17   Tanner, Lyndon Code., PA-C  valsartan-hydrochlorothiazide (DIOVAN-HCT) 80-12.5 MG tablet Take 1 tablet by mouth daily. 08/21/17   [provider]    Family History Family History  Problem Relation Age of Onset  . Heart attack Father 58  . Lupus Maternal Aunt   . Prostate cancer Maternal Grandfather 36  . Dementia Paternal Grandmother   . Leukemia Cousin 8       paternal first cousin  Social History Social History   Tobacco Use  . Smoking status: Never Smoker  . Smokeless tobacco: Never Used  Substance Use Topics  . Alcohol use: Yes    Comment: Rarely- maybe  every 6 months  . Drug use: No     Allergies   Lisinopril and Tamoxifen   Review of Systems Review of Systems  Constitutional: Negative for chills, diaphoresis, fatigue, fever and malaise/fatigue.  HENT: Negative for congestion and rhinorrhea.   Eyes: Negative for visual disturbance.  Respiratory: Positive for cough, chest tightness and shortness of breath. Negative for sputum production, wheezing and stridor.   Cardiovascular: Positive for chest pain. Negative for palpitations, leg swelling, syncope and near-syncope.  Gastrointestinal: Negative for abdominal pain, constipation, diarrhea, nausea and vomiting.  Genitourinary: Negative for dyspareunia, flank pain and frequency.  Musculoskeletal: Negative for back pain,  neck pain and neck stiffness.  Skin: Negative for rash and wound.  Neurological: Negative for light-headedness, numbness and headaches.  Psychiatric/Behavioral: Negative for agitation and confusion.  All other systems reviewed and are negative.    Physical Exam Updated Vital Signs BP (!) 144/96 (BP Location: Left Arm)   Pulse (!) 115   Temp 99 F (37.2 C) (Oral)   Resp 16   Ht _0  (1.702 m)   Wt 75.3 kg (166 lb)   SpO2 100%   BMI 26.00 kg/m   Physical Exam  Constitutional: She is oriented to person, place, and time. She appears well-developed and well-nourished.  Non-toxic appearance. She does not appear ill. No distress.  HENT:  Head: Normocephalic and atraumatic.  Nose: Nose normal.  Mouth/Throat: Oropharynx is clear and moist. No oropharyngeal exudate.  Eyes: Pupils are equal, round, and reactive to light. Conjunctivae and EOM are normal.  Neck: Normal range of motion.  Cardiovascular: Normal rate and intact distal pulses.  No murmur heard. Pulmonary/Chest: Effort normal and breath sounds normal. No respiratory distress. She has no wheezes. She has no rales. She exhibits tenderness (mild).  Abdominal: Soft. She exhibits no distension. There is tenderness (chronic per pt).  Musculoskeletal: She exhibits no edema or tenderness.  Neurological: She is alert and oriented to person, place, and time. No sensory deficit. She exhibits normal muscle tone.  Skin: Capillary refill takes less than 2 seconds. No rash noted. She is not diaphoretic. No erythema.  Psychiatric: She has a normal mood and affect.  Nursing note and vitals reviewed.    ED Treatments / Results  Labs (all labs ordered are listed, but only abnormal results are displayed) Labs Reviewed  BASIC METABOLIC PANEL - Abnormal; Notable for the following components:      Result Value   Glucose, Bld 134 (*)    Calcium 8.8 (*)    All other components within normal limits  CBC - Abnormal; Notable for the following  components:   RBC 3.18 (*)    Hemoglobin 8.0 (*)    HCT 26.1 (*)    MCH 25.2 (*)    RDW 19.2 (*)    All other components within normal limits  HEPATIC FUNCTION PANEL - Abnormal; Notable for the following components:   Bilirubin, Direct <0.1 (*)    All other components within normal limits  I-STAT TROPONIN, ED  I-STAT BETA HCG BLOOD, ED ([facility], [facility], [facility] ONLY)    EKG None  ED ECG REPORT   Date: 09/04/2017  Rate: 116  Rhythm: sinus tachycardia  QRS Axis: normal  Intervals: normal  ST/T Wave abnormalities: normal  Conduction Disutrbances:none  Narrative Interpretation:   Old EKG Reviewed: none available and   I have personally reviewed the EKG tracing and agree with the computerized printout as noted.   Radiology Dg Chest 2 View  Result Date: 09/04/2017 CLINICAL DATA:  Chest pain, weakness, fever EXAM: CHEST - 2 VIEW COMPARISON:  Chest CT 09/02/2017 FINDINGS: Left Port-A-Cath in place with the tip in the SVC. Heart is normal size. No confluent airspace opacities or effusions. No acute bony abnormality. IMPRESSION: No active cardiopulmonary disease. Electronically Signed   By: Rolm Baptise M.D.   On: 09/04/2017 11:09   Ct Angio Chest Pe W And/or Wo Contrast  Result Date: 09/04/2017 CLINICAL DATA:  Chest pain.  History of breast carcinoma EXAM: CT ANGIOGRAPHY CHEST WITH CONTRAST TECHNIQUE: Multidetector CT imaging of the chest was performed using the standard protocol during bolus administration of intravenous contrast. Multiplanar CT image reconstructions and MIPs were obtained to evaluate the vascular anatomy. CONTRAST:  7m ISOVUE-370 IOPAMIDOL (ISOVUE-370) INJECTION 76% COMPARISON:  Chest CT Sep 02, 2017 and chest radiograph Sep 04, 2017 FINDINGS: Cardiovascular: There are pulmonary emboli in several subsegmental branches of the right lower lobe pulmonary artery. No more central pulmonary embolus evident. No evidence of right heart strain. No thoracic aortic aneurysm or dissection.  The visualized great vessels appear unremarkable. No pericardial effusion or pericardial thickening is evident. Port-A-Cath tip is in the superior vena cava near the cavoatrial junction. Mediastinum/Nodes: Visualized thyroid appears normal. There are scattered subcentimeter mediastinal lymph nodes. There is a subcarinal lymph node measuring 1.7 x 1.1 cm. No other lymph node enlargement noted. No change in mediastinal lymph nodes compared to 2 days prior. No esophageal lesions are evident. Lungs/Pleura: There is subpleural consolidation in the periphery of the right middle lobe, stable. There is a focal nodular lesion in the medial segment right middle lobe measuring 7 x 7 mm, stable. This nodular opacity is best seen on axial slice 57 series 7 and is stable compared to 2 days prior. No new areas of airspace consolidation are identified. No pleural effusion or pleural thickening is evident. There is again noted a 2 mm nodular opacity in the periphery of the posterior segment of the left upper lobe near the major fissure. Upper Abdomen: Widespread hepatic metastatic disease again noted. Musculoskeletal: Widespread sclerotic bony metastases again noted. Postoperative change noted in right breast, stable. Review of the MIP images confirms the above findings. IMPRESSION: 1. Small peripheral pulmonary emboli in the right lower lobe pulmonary artery. No more central pulmonary emboli. No right heart strain evident. 2.  Widespread hepatic and bony metastatic disease. 3. Stable consolidation periphery of right middle lobe, unchanged from 2 days prior. Subcentimeter nodular opacities as well as mild adenopathy unchanged from 2 days prior. 4.  Stable postoperative change right breast. Critical Value/emergent results were called by telephone at the time of interpretation on 09/04/2017 at 12:46 pm to Dr. CMarda Stalker, who verbally acknowledged these results. Electronically Signed   By: WLowella GripIII M.D.   On:  09/04/2017 12:48    Procedures Procedures (including critical care time)  Medications Ordered in ED Medications  iopamidol (ISOVUE-370) 76 % injection (has no administration in time range)  sodium chloride 0.9 % bolus 1,000 mL (1,000 mLs Intravenous New Bag/Given 09/04/17 1449)  HYDROmorphone (DILAUDID) injection 1 mg (1 mg Intravenous Given 09/04/17 1158)  sodium chloride 0.9 % bolus 1,000 mL (0 mLs Intravenous Stopped 09/04/17 1340)  iopamidol (ISOVUE-370) 76 % injection 100 mL (68 mLs Intravenous  Contrast Given 09/04/17 1215)  HYDROmorphone (DILAUDID) injection 1 mg (1 mg Intravenous Given 09/04/17 1449)     Initial Impression / Assessment and Plan / ED Course  I have reviewed the triage vital signs and the nursing notes.  Pertinent labs & imaging results that were available during my care of the patient were reviewed by me and considered in my medical decision making (see chart for details).     DUSTY WAGONER is a 42 y.o. female with a past medical history significant for breast cancer with widespread metastasis, hypertension, and diabetes who presents with chest pain, shortness of breath, cough, and chills.  Patient reports that she is undergoing chemotherapy and radiation for her cancer but unfortunately it has metastasized and is spreading.  She says that she had an appointment with oncology yesterday and they decided to change her chemo regimen.  She says that after leaving a warm yesterday she began having diffuse chest pain across her chest that was pressure-like, sharp, and very pleuritic.  It came on suddenly.  She reports associated shortness of breath.  She does report some coughing but has not had any production.  No hemoptysis.  She denies fevers but does report some chills.  She denies any abdominal pain or new back pain.  She denies nausea, vomiting, urinary symptoms or GI symptoms.  She denies any history of DVT or PE in the past.  On exam, minimal tenderness present  across the patient's chest.  Lungs had some coarse breath sounds bilaterally.  Abdomen was nontender.  Back was nontender.  Patient has pulses in upper and lower extremities.  No significant edema in legs seen.  EKG showed sinus tachycardia.  No STEMI.  Clinically I am concerned about pneumonia versus pulmonary embolism versus cancer pain for this patient.  Patient was given fluids for rehydration to try and improve her tachycardia as well as pain medication.  Although patient had recent CT imaging of the chest, patient's pain is new and will have PE study to rule out pulmonary embolism.  Anticipate reassessment after work-up.  2:22 PM Diagnostic work-up returned showing evidence of right-sided pulmonary emboli.  I suspect this is the cause of the patient's pain.  Patient is also persistently tachycardic in the 120s to 130s.  Patient still having some chest pain.  Oxygen saturations were in the upper 80s.  Patient placed on 2 L nasal cannula with improvement in oxygen saturations.    Given the pulmonary emboli, tachycardia, and hypoxia requiring oxygen, patient was not felt stable for discharge home.  Patient will be admitted for further management and for discussion of anticoagulation.  Patient is anemic with hemoglobin of 8.0 today.  Chart review does not show recent head imaging to rule out metastasis to the brain.  Will defer to hospital service for further evaluation prior to anticoagulated.   Final Clinical Impressions(s) / ED Diagnoses   Final diagnoses:  Precordial pain  Acute pulmonary embolism without acute cor pulmonale, unspecified pulmonary embolism type (HCC)  Hypoxia  Tachycardia     Clinical Impression: 1. Precordial pain   2. Acute pulmonary embolism without acute cor pulmonale, unspecified pulmonary embolism type ([place])   3. Hypoxia   4. Tachycardia     Disposition: Admit  This note was prepared with assistance of Dragon voice recognition software. Occasional  wrong-word or sound-a-like substitutions may have occurred due to the inherent limitations of voice recognition software.     Kavian Peters, Gwenyth Allegra, MD 09/04/17 1455

## 2017-09-04 NOTE — ED Notes (Signed)
Attempted to call report x 2, RN continues to be unavailable.  Pt still in MRI.

## 2017-09-04 NOTE — ED Notes (Signed)
Ambulated to bathroom, tolerated well, able to obtain urine

## 2017-09-05 ENCOUNTER — Observation Stay (HOSPITAL_BASED_OUTPATIENT_CLINIC_OR_DEPARTMENT_OTHER): Payer: Medicare Other

## 2017-09-05 DIAGNOSIS — Z170 Estrogen receptor positive status [ER+]: Secondary | ICD-10-CM | POA: Diagnosis not present

## 2017-09-05 DIAGNOSIS — E118 Type 2 diabetes mellitus with unspecified complications: Secondary | ICD-10-CM | POA: Diagnosis not present

## 2017-09-05 DIAGNOSIS — I361 Nonrheumatic tricuspid (valve) insufficiency: Secondary | ICD-10-CM | POA: Diagnosis not present

## 2017-09-05 DIAGNOSIS — C7951 Secondary malignant neoplasm of bone: Secondary | ICD-10-CM | POA: Diagnosis not present

## 2017-09-05 DIAGNOSIS — I340 Nonrheumatic mitral (valve) insufficiency: Secondary | ICD-10-CM

## 2017-09-05 DIAGNOSIS — C50919 Malignant neoplasm of unspecified site of unspecified female breast: Secondary | ICD-10-CM | POA: Diagnosis not present

## 2017-09-05 DIAGNOSIS — R509 Fever, unspecified: Secondary | ICD-10-CM | POA: Diagnosis not present

## 2017-09-05 DIAGNOSIS — M79609 Pain in unspecified limb: Secondary | ICD-10-CM

## 2017-09-05 DIAGNOSIS — C50211 Malignant neoplasm of upper-inner quadrant of right female breast: Secondary | ICD-10-CM | POA: Diagnosis not present

## 2017-09-05 DIAGNOSIS — Z95828 Presence of other vascular implants and grafts: Secondary | ICD-10-CM | POA: Diagnosis not present

## 2017-09-05 DIAGNOSIS — J219 Acute bronchiolitis, unspecified: Secondary | ICD-10-CM | POA: Diagnosis not present

## 2017-09-05 DIAGNOSIS — I10 Essential (primary) hypertension: Secondary | ICD-10-CM | POA: Diagnosis not present

## 2017-09-05 DIAGNOSIS — I2699 Other pulmonary embolism without acute cor pulmonale: Principal | ICD-10-CM

## 2017-09-05 DIAGNOSIS — R072 Precordial pain: Secondary | ICD-10-CM

## 2017-09-05 DIAGNOSIS — R5081 Fever presenting with conditions classified elsewhere: Secondary | ICD-10-CM

## 2017-09-05 DIAGNOSIS — Z86711 Personal history of pulmonary embolism: Secondary | ICD-10-CM

## 2017-09-05 LAB — FOLATE: Folate: 13.5 ng/mL (ref >5.9)

## 2017-09-05 LAB — CBC
HCT: 24.4 % — ABNORMAL LOW (ref 36.0–46.0)
Hemoglobin: 7.3 g/dL — ABNORMAL LOW (ref 12.0–15.0)
MCH: 24.7 pg — ABNORMAL LOW (ref 26.0–34.0)
MCHC: 29.9 g/dL — ABNORMAL LOW (ref 30.0–36.0)
MCV: 82.7 fL (ref 78.0–100.0)
Platelets: 260 10*3/uL (ref 150–400)
RBC: 2.95 MIL/uL — ABNORMAL LOW (ref 3.87–5.11)
RDW: 19.1 % — ABNORMAL HIGH (ref 11.5–15.5)
WBC: 7.5 10*3/uL (ref 4.0–10.5)

## 2017-09-05 LAB — BASIC METABOLIC PANEL WITH GFR
Anion gap: 10 (ref 5–15)
Chloride: 104 mmol/L (ref 101–111)
GFR calc Af Amer: >60 mL/min (ref >60)
GFR calc non Af Amer: >60 mL/min (ref >60)
Potassium: 4 mmol/L (ref 3.5–5.1)
Sodium: 137 mmol/L (ref 135–145)

## 2017-09-05 LAB — ECHOCARDIOGRAM COMPLETE
Height: 67 in
Weight: 2656 oz

## 2017-09-05 LAB — BASIC METABOLIC PANEL
BUN: 6 mg/dL (ref 6–20)
CO2: 23 mmol/L (ref 22–32)
Calcium: 8 mg/dL — ABNORMAL LOW (ref 8.9–10.3)
Creatinine, Ser: 0.49 mg/dL (ref 0.44–1.00)
Glucose, Bld: 181 mg/dL — ABNORMAL HIGH (ref 65–99)

## 2017-09-05 LAB — GLUCOSE, CAPILLARY
Glucose-Capillary: 119 mg/dL — ABNORMAL HIGH (ref 65–99)
Glucose-Capillary: 148 mg/dL — ABNORMAL HIGH (ref 65–99)
Glucose-Capillary: 163 mg/dL — ABNORMAL HIGH (ref 65–99)
Glucose-Capillary: 119 mg/dL — ABNORMAL HIGH (ref 65–99)

## 2017-09-05 LAB — RETICULOCYTES
RBC.: 3.32 MIL/uL — AB (ref 3.87–5.11)
RETIC CT PCT: 1.6 % (ref 0.4–3.1)
Retic Count, Absolute: 53.1 10*3/uL (ref 19.0–186.0)

## 2017-09-05 LAB — IRON AND TIBC
Iron: 24 ug/dL — ABNORMAL LOW (ref 28–170)
SATURATION RATIOS: 9 % — AB (ref 10.4–31.8)
TIBC: 270 ug/dL (ref 250–450)
UIBC: 246 ug/dL

## 2017-09-05 LAB — FERRITIN: FERRITIN: 289 ng/mL (ref 11–307)

## 2017-09-05 LAB — HIV ANTIBODY (ROUTINE TESTING W REFLEX): HIV Screen 4th Generation wRfx: NONREACTIVE

## 2017-09-05 MED ORDER — RIVAROXABAN 15 MG PO TABS
15.0000 mg | ORAL_TABLET | Freq: Two times a day (BID) | ORAL | Status: DC
  Administered 2017-09-05 – 2017-09-07 (×4): 15 mg via ORAL
  Filled 2017-09-05 (×5): qty 1

## 2017-09-05 MED ORDER — RIVAROXABAN 15 MG PO TABS
15.0000 mg | ORAL_TABLET | ORAL | Status: AC
  Administered 2017-09-05: 15 mg via ORAL
  Filled 2017-09-05: qty 1

## 2017-09-05 MED ORDER — OXYCODONE-ACETAMINOPHEN 5-325 MG PO TABS
1.0000 | ORAL_TABLET | ORAL | Status: DC | PRN
Start: 2017-09-05 — End: 2017-09-06
  Administered 2017-09-05 – 2017-09-06 (×2): 2 via ORAL
  Filled 2017-09-05 (×2): qty 2

## 2017-09-05 MED ORDER — RIVAROXABAN 20 MG PO TABS: 20.0000 mg | ORAL_TABLET | Freq: Every day | ORAL | Status: DC

## 2017-09-05 MED ORDER — ENOXAPARIN SODIUM 80 MG/0.8ML ~~LOC~~ SOLN: 1.0000 mg/kg | Freq: Two times a day (BID) | SUBCUTANEOUS | Status: DC

## 2017-09-05 NOTE — Progress Notes (Signed)
  Echocardiogram 2D Echocardiogram has been performed.  Debbie Martinez 09/05/2017, 10:05 AM

## 2017-09-05 NOTE — Progress Notes (Addendum)
Preliminary notes--Bilateral lower extremities venous duplex exam completed. Negative for DVT.  Incidental findings: a hypoechoic non-vascular structure seen at the right groin area, measuring 1.63x0.72x0.97cm;  left groin area, a heterogenous non-vascular structure seen measuring 1.73x0.94x1.42cm.  Left popliteal fossa superior, a prominent nerve like structure seen, where patient states the area of pain.    [place]ongying Kayd Launer (RDMS RVT) 09/05/17 9:08 AM

## 2017-09-05 NOTE — Discharge Instructions (Signed)

## 2017-09-05 NOTE — Progress Notes (Signed)
ANTICOAGULATION CONSULT NOTE - Initial Consult  Pharmacy Consult for Rivaroxaban  Indication: pulmonary embolus  Allergies  Allergen Reactions  . Lisinopril Swelling and Cough    Swelling under eyes and eyelids    . Tamoxifen Itching    Patient Measurements: Height: 5\' 7"  (170.2 cm) Weight: 166 lb (75.3 kg) IBW/kg (Calculated) : 61.6   Vital Signs: Temp: 98.8 F (37.1 C) (05/16 0511) Temp Source: Oral (05/16 0511) BP: 115/73 (05/16 0511) Pulse Rate: 95 (05/16 0511)  Labs: Recent Labs    09/03/17 1108 09/04/17 1200 09/05/17 0506  HGB 8.2* 8.0* 7.3*  HCT 25.4* 26.1* 24.4*  PLT 263 294 260  CREATININE 0.71 0.45 0.49    Estimated Creatinine Clearance: 98 mL/min (by C-G formula based on SCr of 0.49 mg/dL).   Medical History: Past Medical History:  Diagnosis Date  . Anxiety    gets sweaty and short of breath  . Breast cancer ([place]) 01/20/14   triple positive  . Diabetes   . GERD (gastroesophageal reflux disease)    does not take anything  . History of radiation therapy 11/03/14- 12/14/14   Right Breast, supraclavicular nodes, axillary nodes, and internal mammary nodes.  . History of radiation therapy 01/04/16- 01/23/16   Spine metastasis L2-S2  . Hypertension   . Liver metastases ([place])   . Metastatic cancer to bone ([place])   . Shortness of breath    'anxiety'    Assessment: 33 y/oF with PMH of metastatic breast cancer who presented to [facility] ED on 09/04/17 with chest pain. CTa of chest + for small PE.  MRI of brain showed no acute intracranial abnormality, no metastatic disease to the brain. Bilateral lower extremity ultrasound negative for DVT. Patient initially ordered treatment dose Enoxaparin for  PE, but changed to Rivaroxaban per [facility] at recommendation of Dr. Lindi Adie. Last dose of Enoxaparin 40mg  SQ given 09/04/17 at 2156. Patient not on any anticoagulants PTA. CBC reveals Hgb low at 7.3, Pltc WNL. No bleeding issues reported. SCr 0.49 with CrCl well above 30 ml/min.    Goal of Therapy:  Treatment of PE Absence of bleeding   Plan:   Rivaroxaban 15mg  PO BID with meals x 21 days, then 20mg  PO daily with supper.   Monitor CBC and for s/sx of bleeding.  Pharmacy to provide education prior to discharge.   Lindell Spar, PharmD, BCPS Pager: [phone] 09/05/2017 08:30 AM

## 2017-09-05 NOTE — Progress Notes (Addendum)
PROGRESS NOTE    Debbie Martinez   EXB:[id number]  DOB: 1975/04/25  DOA: 09/04/2017 PCP: Lucianne Lei, MD   Brief Narrative:  Debbie Martinez is a 42 y/o wth metastatic breast cancer, DM2 on oral medications, HTN and asthma.  She presents for left sided aching chest pain (points to precordial area) . She also states she has been having cramping pain in her right chest for a couple of days. In the ED she stated that her chronic cough was worse as well but non-productive.  CT revealed small PEs in right subsegmental arteries and she was referred for admission.   Subjective: Today states that her cough is much better than it has been. She also states she has had persistent pain in her legs going from both hips to her feet. Pain is worse with walking and better when using a heating pad.  ROS: no complaints of nausea, vomiting, constipation diarrhea, cough, dyspnea or dysuria. No other complaints.   Assessment & Plan:   Principal Problem:   Pulmonary embolism  - right subsegmental, pulse ox is 100% on room air, no cardiac strain - MRI brain done to r/o mets prior to staring anticoagulation - venous duplex of LE negative for DVT - I discussed anticoagulation with Dr Lindi Adie and he prefers Xarelto- I have started it today as she is relatively stable  Active Problems: Chest pain - having 2 types of chest pain - right sided pain is cramping and she has not had it today- doubtful it is related to the PE - left sided pain is precordial and a dull ache- chest is tender to palpation in this area   -  it is likely musculoskeletal - Percocet (10 mg) is helping with the left sided chest pain - 2 D ECHO ordered by admitting doc- results noted below- no wall motion abnormalities noted  Dehydration  - Hypertension  - cont ARB   - given 2 L NS in ED and then on continuous fluid since- stop HCTZ  Anemia - Hb 8 - dropped to 7 after > 2 L NS- obtain anemia panel- I feel she is diluted and  have not ordered any blood transfusion    Breast cancer of upper-inner quadrant of right female breast metastatic to multiple sites   - receives Eribulin injections at the cancer center - cont Valsartan     Diabetes  - on Janumet at home- cont SSI    Port catheter in place  Asthma -   her ongoing cough for the past few months, she states, has improved after being started on an inhaler (on Symbicort)    DVT prophylaxis: Xarelto Code Status: Full code Family Communication:  Disposition Plan: home tomorrow Consultants:   Phone conversation with Dr Lindi Adie Procedures:   2 D ECHO Study Conclusions  - Left ventricle: The cavity size was normal. Wall thickness was   increased in a pattern of mild LVH. Systolic function was normal.   The estimated ejection fraction was in the range of 55% to 60%.   Wall motion was normal; there were no regional wall motion   abnormalities. Left ventricular diastolic function parameters   were normal. - Aortic valve: Transvalvular velocity was within the normal range.   There was no stenosis. There was no regurgitation. - Mitral valve: Transvalvular velocity was within the normal range.   There was no evidence for stenosis. There was mild regurgitation. - Right ventricle: The cavity size was normal. Wall thickness was  normal. Systolic function was normal. - Atrial septum: No defect or patent foramen ovale was identified   by color flow Doppler. - Tricuspid valve: There was mild regurgitation. - Pulmonary arteries: Systolic pressure was within the normal   range. PA peak pressure: 29 mm Hg (S). - Global longitudinal strain -19.2% (normal).  Antimicrobials:  Anti-infectives (From admission, onward)   Start     Dose/Rate Route Frequency Ordered Stop   09/04/17 2200  valACYclovir (VALTREX) tablet 1,000 mg     1,000 mg Oral 3 times daily 09/04/17 1739         Objective: Vitals:   09/04/17 2128 09/05/17 0511 09/05/17 0952 09/05/17 1320    BP: 128/88 115/73  (!) 152/92  Pulse: (!) 109 95  (!) 104  Resp: 18 18  18   Temp: 98.4 F (36.9 C) 98.8 F (37.1 C)  98.8 F (37.1 C)  TempSrc: Oral Oral  Oral  SpO2: 100% 100% 98% 100%  Weight:      Height:       No intake or output data in the 24 hours ending 09/05/17 1436 Filed Weights   09/04/17 1024 09/04/17 1739  Weight: 75.3 kg (166 lb) 75.3 kg (166 lb)    Examination: General exam: Appears comfortable  HEENT: PERRLA, oral mucosa moist, no sclera icterus or thrush Respiratory system: Clear to auscultation. Respiratory effort normal. Cardiovascular system: S1 & S2 heard, RRR.   Chest: tender in left parasternal area Gastrointestinal system: Abdomen soft, non-tender, nondistended. Normal bowel sound. No organomegaly Central nervous system: Alert and oriented. No focal neurological deficits. Extremities: No cyanosis, clubbing or edema Skin: No rashes or ulcers Psychiatry:  Mood & affect appropriate.     Data Reviewed: I have personally reviewed following labs and imaging studies  CBC: Recent Labs  Lab 09/03/17 1108 09/04/17 1200 09/05/17 0506  WBC 5.5 8.1 7.5  NEUTROABS 4.4  --   --   HGB 8.2* 8.0* 7.3*  HCT 25.4* 26.1* 24.4*  MCV 80.2 82.1 82.7  PLT 263 294 762   Basic Metabolic Panel: Recent Labs  Lab 09/03/17 1108 09/04/17 1200 09/05/17 0506  NA 137 138 137  K 3.9 3.9 4.0  CL 102 104 104  CO2 26 22 23   GLUCOSE 261* 134* 181*  BUN 9 9 6   CREATININE 0.71 0.45 0.49  CALCIUM 9.3 8.8* 8.0*   GFR: Estimated Creatinine Clearance: 98 mL/min (by C-G formula based on SCr of 0.49 mg/dL). Liver Function Tests: Recent Labs  Lab 09/03/17 1108 09/04/17 1200  AST 23 29  ALT 22 26  ALKPHOS 104 89  BILITOT 0.3 0.5  PROT 7.9 7.8  ALBUMIN 3.5 3.5   No results for input(s): LIPASE, AMYLASE in the last 168 hours. No results for input(s): AMMONIA in the last 168 hours. Coagulation Profile: No results for input(s): INR, PROTIME in the last 168  hours. Cardiac Enzymes: No results for input(s): CKTOTAL, CKMB, CKMBINDEX, TROPONINI in the last 168 hours. BNP (last 3 results) No results for input(s): PROBNP in the last 8760 hours. HbA1C: No results for input(s): HGBA1C in the last 72 hours. CBG: Recent Labs  Lab 09/04/17 1747 09/04/17 1819 09/04/17 2126 09/05/17 0750 09/05/17 1210  GLUCAP 69 103* 148* 119* 148*   Lipid Profile: No results for input(s): CHOL, HDL, LDLCALC, TRIG, CHOLHDL, LDLDIRECT in the last 72 hours. Thyroid Function Tests: No results for input(s): TSH, T4TOTAL, FREET4, T3FREE, THYROIDAB in the last 72 hours. Anemia Panel: No results for input(s): VITAMINB12, FOLATE,  FERRITIN, TIBC, IRON, RETICCTPCT in the last 72 hours. Urine analysis:    Component Value Date/Time   COLORURINE YELLOW 09/04/2017 [place] 09/04/2017 1509   LABSPEC >1.046 (H) 09/04/2017 1509   PHURINE 7.0 09/04/2017 1509   GLUCOSEU NEGATIVE 09/04/2017 1509   HGBUR NEGATIVE 09/04/2017 [place] 09/04/2017 1509   KETONESUR NEGATIVE 09/04/2017 1509   PROTEINUR NEGATIVE 09/04/2017 1509   NITRITE NEGATIVE 09/04/2017 1509   LEUKOCYTESUR NEGATIVE 09/04/2017 1509   Sepsis Labs: @LABRCNTIP (procalcitonin:4,lacticidven:4) )No results found for this or any previous visit (from the past 240 hour(s)).       Radiology Studies: Dg Chest 2 View  Result Date: 09/04/2017 CLINICAL DATA:  Chest pain, weakness, fever EXAM: CHEST - 2 VIEW COMPARISON:  Chest CT 09/02/2017 FINDINGS: Left Port-A-Cath in place with the tip in the SVC. Heart is normal size. No confluent airspace opacities or effusions. No acute bony abnormality. IMPRESSION: No active cardiopulmonary disease. Electronically Signed   By: Rolm Baptise M.D.   On: 09/04/2017 11:09   Ct Angio Chest Pe W And/or Wo Contrast  Result Date: 09/04/2017 CLINICAL DATA:  Chest pain.  History of breast carcinoma EXAM: CT ANGIOGRAPHY CHEST WITH CONTRAST TECHNIQUE:  Multidetector CT imaging of the chest was performed using the standard protocol during bolus administration of intravenous contrast. Multiplanar CT image reconstructions and MIPs were obtained to evaluate the vascular anatomy. CONTRAST:  62mL ISOVUE-370 IOPAMIDOL (ISOVUE-370) INJECTION 76% COMPARISON:  Chest CT Sep 02, 2017 and chest radiograph Sep 04, 2017 FINDINGS: Cardiovascular: There are pulmonary emboli in several subsegmental branches of the right lower lobe pulmonary artery. No more central pulmonary embolus evident. No evidence of right heart strain. No thoracic aortic aneurysm or dissection. The visualized great vessels appear unremarkable. No pericardial effusion or pericardial thickening is evident. Port-A-Cath tip is in the superior vena cava near the cavoatrial junction. Mediastinum/Nodes: Visualized thyroid appears normal. There are scattered subcentimeter mediastinal lymph nodes. There is a subcarinal lymph node measuring 1.7 x 1.1 cm. No other lymph node enlargement noted. No change in mediastinal lymph nodes compared to 2 days prior. No esophageal lesions are evident. Lungs/Pleura: There is subpleural consolidation in the periphery of the right middle lobe, stable. There is a focal nodular lesion in the medial segment right middle lobe measuring 7 x 7 mm, stable. This nodular opacity is best seen on axial slice 57 series 7 and is stable compared to 2 days prior. No new areas of airspace consolidation are identified. No pleural effusion or pleural thickening is evident. There is again noted a 2 mm nodular opacity in the periphery of the posterior segment of the left upper lobe near the major fissure. Upper Abdomen: Widespread hepatic metastatic disease again noted. Musculoskeletal: Widespread sclerotic bony metastases again noted. Postoperative change noted in right breast, stable. Review of the MIP images confirms the above findings. IMPRESSION: 1. Small peripheral pulmonary emboli in the right  lower lobe pulmonary artery. No more central pulmonary emboli. No right heart strain evident. 2.  Widespread hepatic and bony metastatic disease. 3. Stable consolidation periphery of right middle lobe, unchanged from 2 days prior. Subcentimeter nodular opacities as well as mild adenopathy unchanged from 2 days prior. 4.  Stable postoperative change right breast. Critical Value/emergent results were called by telephone at the time of interpretation on 09/04/2017 at 12:46 pm to Dr. Marda Stalker , who verbally acknowledged these results. Electronically Signed   By: Lowella Grip III M.D.   On:  09/04/2017 12:48   Mr Jeri Cos YP Contrast  Result Date: 09/04/2017 CLINICAL DATA:  42 year old female with metastatic breast cancer. New onset weakness. Small peripheral pulmonary emboli diagnosed on CTA today. EXAM: MRI HEAD WITHOUT AND WITH CONTRAST TECHNIQUE: Multiplanar, multiecho pulse sequences of the brain and surrounding structures were obtained without and with intravenous contrast. CONTRAST:  20mL MULTIHANCE GADOBENATE DIMEGLUMINE 529 MG/ML IV SOLN COMPARISON:  Chest CTA 1222 hours today. Nuclear medicine whole-body bone scan 06/06/2017. FINDINGS: Brain: No abnormal enhancement identified. No midline shift, mass effect, or evidence of intracranial mass lesion. No dural thickening. Vascular: No restricted diffusion to suggest acute infarction. No ventriculomegaly, extra-axial collection or acute intracranial hemorrhage. Cervicomedullary junction and pituitary are within normal limits. Pearline Cables and white matter signal is within normal limits throughout the brain. No chronic cerebral blood products or encephalomalacia. Skull and upper cervical spine: Sclerotic bone metastasis in the C3 vertebral body (series 4, image 12). Heterogeneous bone marrow signal in the calvarium suspicious for scattered skull metastases (including possibly the clivus), but no destructive skull lesion is identified. Negative visible  cervical spinal cord. Sinuses/Orbits: Mildly Disconjugate gaze but otherwise normal orbit soft tissues. Paranasal sinuses are clear. Other: Mastoid air cells are clear. Visible internal auditory structures appear normal. Scalp and face soft tissues appear negative. IMPRESSION: 1. No acute intracranial abnormality. No metastatic disease to the brain identified. 2. C3 vertebral body bone metastasis. Bone marrow heterogeneity elsewhere in the skull and cervical spine but no other overt bone lesion identified. Electronically Signed   By: Genevie Ann M.D.   On: 09/04/2017 17:11      Scheduled Meds: . fluticasone furoate-vilanterol  1 puff Inhalation Daily  . insulin aspart  0-9 Units Subcutaneous TID WC  . pantoprazole  40 mg Oral Daily  . rivaroxaban  15 mg Oral BID WC   Followed by  . [START ON 09/26/2017] rivaroxaban  20 mg Oral Q supper  . valACYclovir  1,000 mg Oral TID  . valsartan  80 mg Oral Daily   Continuous Infusions: . sodium chloride 100 mL/hr at 09/04/17 2013     LOS: 0 days    Time spent in minutes: 35    Debbe Odea, MD [facility] Pager: [email or web address] Password [facility] 09/05/2017, 2:36 PM

## 2017-09-06 ENCOUNTER — Telehealth: Payer: Self-pay | Admitting: Hematology and Oncology

## 2017-09-06 ENCOUNTER — Encounter (HOSPITAL_COMMUNITY): Payer: Self-pay

## 2017-09-06 DIAGNOSIS — R5081 Fever presenting with conditions classified elsewhere: Secondary | ICD-10-CM | POA: Diagnosis not present

## 2017-09-06 DIAGNOSIS — J219 Acute bronchiolitis, unspecified: Secondary | ICD-10-CM

## 2017-09-06 DIAGNOSIS — C50211 Malignant neoplasm of upper-inner quadrant of right female breast: Secondary | ICD-10-CM | POA: Diagnosis not present

## 2017-09-06 DIAGNOSIS — R072 Precordial pain: Secondary | ICD-10-CM | POA: Diagnosis not present

## 2017-09-06 DIAGNOSIS — R509 Fever, unspecified: Secondary | ICD-10-CM

## 2017-09-06 DIAGNOSIS — I2699 Other pulmonary embolism without acute cor pulmonale: Secondary | ICD-10-CM | POA: Diagnosis not present

## 2017-09-06 LAB — RESPIRATORY PANEL BY PCR
ADENOVIRUS-RVPPCR: NOT DETECTED
Bordetella pertussis: NOT DETECTED
CORONAVIRUS 229E-RVPPCR: NOT DETECTED
CORONAVIRUS HKU1-RVPPCR: NOT DETECTED
CORONAVIRUS NL63-RVPPCR: NOT DETECTED
Chlamydophila pneumoniae: NOT DETECTED
Coronavirus OC43: NOT DETECTED
INFLUENZA A-RVPPCR: NOT DETECTED
Influenza B: NOT DETECTED
MYCOPLASMA PNEUMONIAE-RVPPCR: NOT DETECTED
Metapneumovirus: NOT DETECTED
PARAINFLUENZA VIRUS 1-RVPPCR: NOT DETECTED
PARAINFLUENZA VIRUS 4-RVPPCR: NOT DETECTED
Parainfluenza Virus 2: NOT DETECTED
Parainfluenza Virus 3: NOT DETECTED
Respiratory Syncytial Virus: NOT DETECTED
Rhinovirus / Enterovirus: NOT DETECTED

## 2017-09-06 LAB — CBC
HCT: 26.3 % — ABNORMAL LOW (ref 36.0–46.0)
Hemoglobin: 7.8 g/dL — ABNORMAL LOW (ref 12.0–15.0)
MCH: 24.5 pg — AB (ref 26.0–34.0)
MCHC: 29.7 g/dL — ABNORMAL LOW (ref 30.0–36.0)
MCV: 82.4 fL (ref 78.0–100.0)
PLATELETS: 278 10*3/uL (ref 150–400)
RBC: 3.19 MIL/uL — AB (ref 3.87–5.11)
RDW: 19.2 % — ABNORMAL HIGH (ref 11.5–15.5)
WBC: 8.3 10*3/uL (ref 4.0–10.5)

## 2017-09-06 LAB — VITAMIN B12: VITAMIN B 12: 713 pg/mL (ref 180–914)

## 2017-09-06 LAB — GLUCOSE, CAPILLARY
Glucose-Capillary: 190 mg/dL — ABNORMAL HIGH (ref 65–99)
Glucose-Capillary: 111 mg/dL — ABNORMAL HIGH (ref 65–99)
Glucose-Capillary: 163 mg/dL — ABNORMAL HIGH (ref 65–99)
Glucose-Capillary: 125 mg/dL — ABNORMAL HIGH (ref 65–99)

## 2017-09-06 MED ORDER — AZITHROMYCIN 250 MG PO TABS
250.0000 mg | ORAL_TABLET | Freq: Every day | ORAL | Status: DC
  Administered 2017-09-07: 250 mg via ORAL
  Filled 2017-09-06: qty 1

## 2017-09-06 MED ORDER — GABAPENTIN 100 MG PO CAPS
100.0000 mg | ORAL_CAPSULE | Freq: Three times a day (TID) | ORAL | Status: DC
  Administered 2017-09-06 – 2017-09-07 (×3): 100 mg via ORAL
  Filled 2017-09-06 (×4): qty 1

## 2017-09-06 MED ORDER — LIP MEDEX EX OINT: TOPICAL_OINTMENT | CUTANEOUS | Status: DC | PRN

## 2017-09-06 MED ORDER — AZITHROMYCIN 250 MG PO TABS
500.0000 mg | ORAL_TABLET | Freq: Every day | ORAL | Status: AC
  Administered 2017-09-06: 500 mg via ORAL
  Filled 2017-09-06: qty 2

## 2017-09-06 MED ORDER — OXYCODONE HCL 5 MG PO TABS
5.0000 mg | ORAL_TABLET | ORAL | Status: DC | PRN
  Administered 2017-09-06: 10 mg via ORAL
  Administered 2017-09-07: 5 mg via ORAL
  Filled 2017-09-06: qty 2
  Filled 2017-09-06: qty 1

## 2017-09-06 NOTE — Progress Notes (Signed)
Hematology oncology: Patient was admitted with pulmonary embolism Her risk factors metastatic breast cancer. I recommended Xarelto for anticoagulation. I will follow her in my clinic next Tuesday where she is going to start new chemotherapy with carboplatin.

## 2017-09-06 NOTE — Telephone Encounter (Signed)
Spoke to patient regarding upcoming appointment updates.

## 2017-09-06 NOTE — Care Management Obs Status (Signed)
[facility] NOTIFICATION   Patient Details  Name: Debbie Martinez MRN: [id number] Date of Birth: 1976/04/05   Medicare Observation Status Notification Given:  Yes    Purcell Mouton, RN 09/06/2017, 4:55 PM

## 2017-09-06 NOTE — Progress Notes (Addendum)
PROGRESS NOTE    Debbie LEONHARDT   Martinez:[id number]  DOB: [birth year]  DOA: 09/04/2017 PCP: Lucianne Lei, MD   Brief Narrative:  Debbie Martinez is a 42 y/o wth metastatic breast cancer, DM2 on oral medications, HTN and asthma.  She presents for left sided aching chest pain (points to precordial area) . She also states she has been having cramping pain in her right chest for a couple of days. In the ED she stated that her chronic cough was worse as well but non-productive.  CT revealed small PEs in right subsegmental arteries and she was referred for admission.   Subjective: Fever of 100.4 this AM. Broke out into a sweat after Percocet given as fever broke. Cough is worse today. No dysuia, diarrhea or new rash.   Assessment & Plan:   Principal Problem:   Pulmonary embolism  - right subsegmental, pulse ox is 100% on room air, no cardiac strain - MRI brain done to r/o mets prior to staring anticoagulation- this was  - venous duplex of LE negative for DVT- official report reviewed - I discussed anticoagulation with Dr Lindi Adie and he prefers Xarelto- I have started it today as she is relatively stable  Active Problems: Fevers  - noted on 5/15 and again today - CXR did note a small area of consolidation in the right (in the periphery of the RML) but this was not new- discussed with radiologist who originally read the CT - he suspected post radiation changes which are evolving vs disease progression - no complaints of dysura or diarrhea or rash - due to worsening dry cough and no changes on Xray, will treat for acute bronchitis with Z pak- check resp virus panel - stop Percocet and give only Oxycodone so that further fevers are not missed - start Z pak -  Chest pain - having 2 types of chest pain - right sided pain is cramping and she has not had it today- doubtful it is related to the PE - left sided pain is precordial and a dull ache- chest is tender to palpation in this area    -  it is likely musculoskeletal - Percocet (10 mg) is helping with the left sided chest pain - 2 D ECHO ordered by admitting doc- results noted below- no wall motion abnormalities noted  Dehydration  - Hypertension  - cont ARB   - given 2 L NS in ED and then on continuous fluid since- stop HCTZ  Anemia - Hb 8 - dropped to 7 after > 2 L NS- obtain anemia panel- I feel she is diluted and have not ordered any blood transfusion- Hb upto 7.8 today - anemia panel consistent with AOCD    Breast cancer of upper-inner quadrant of right female breast metastatic to multiple sites   - receives Eribulin injections at the cancer center - cont Valsartan  B/l leg pain from hips to feet (anterior legs) - likely musculoskeletal- improves with heating pad     Diabetes  - on Janumet at home- cont SSI    Port catheter in place  Asthma -   her ongoing cough for the past few months, she states, has improved after being started on an inhaler (on Symbicort)    DVT prophylaxis: Xarelto Code Status: Full code Family Communication:  Disposition Plan: home tomorrow Consultants:   Phone conversation with Dr Lindi Adie Procedures:   2 D ECHO Study Conclusions  - Left ventricle: The cavity size was normal. Wall thickness was  increased in a pattern of mild LVH. Systolic function was normal.   The estimated ejection fraction was in the range of 55% to 60%.   Wall motion was normal; there were no regional wall motion   abnormalities. Left ventricular diastolic function parameters   were normal. - Aortic valve: Transvalvular velocity was within the normal range.   There was no stenosis. There was no regurgitation. - Mitral valve: Transvalvular velocity was within the normal range.   There was no evidence for stenosis. There was mild regurgitation. - Right ventricle: The cavity size was normal. Wall thickness was   normal. Systolic function was normal. - Atrial septum: No defect or patent foramen  ovale was identified   by color flow Doppler. - Tricuspid valve: There was mild regurgitation. - Pulmonary arteries: Systolic pressure was within the normal   range. PA peak pressure: 29 mm Hg (S). - Global longitudinal strain -19.2% (normal).  Antimicrobials:  Anti-infectives (From admission, onward)   Start     Dose/Rate Route Frequency Ordered Stop   09/04/17 2200  valACYclovir (VALTREX) tablet 1,000 mg     1,000 mg Oral 3 times daily 09/04/17 1739         Objective: Vitals:   09/05/17 2044 09/06/17 0538 09/06/17 0833 09/06/17 1003  BP: 124/82 114/84    Pulse: (!) 105 (!) 107    Resp: 16 14    Temp: 99.6 F (37.6 C) (!) 100.4 F (38 C)  98.4 F (36.9 C)  TempSrc: Oral Oral  Oral  SpO2: 99% 100% 97%   Weight:      Height:        Intake/Output Summary (Last 24 hours) at 09/06/2017 1046 Last data filed at 09/06/2017 0600 Gross per 24 hour  Intake 1640 ml  Output -  Net 1640 ml   Filed Weights   09/04/17 1024 09/04/17 1739  Weight: 75.3 kg (166 lb) 75.3 kg (166 lb)    Examination: General exam: Appears comfortable  HEENT: PERRLA, oral mucosa moist, no sclera icterus or thrush Respiratory system: Clear to auscultation. Respiratory effort normal. Cardiovascular system: S1 & S2 heard, RRR.   Chest: tender in left parasternal area Gastrointestinal system: Abdomen soft, non-tender, nondistended. Normal bowel sound. No organomegaly Central nervous system: Alert and oriented. No focal neurological deficits. Extremities: No cyanosis, clubbing or edema Skin: No rashes or ulcers Psychiatry:  Mood & affect appropriate.     Data Reviewed: I have personally reviewed following labs and imaging studies  CBC: Recent Labs  Lab 09/03/17 1108 09/04/17 1200 09/05/17 0506 09/06/17 0520  WBC 5.5 8.1 7.5 8.3  NEUTROABS 4.4  --   --   --   HGB 8.2* 8.0* 7.3* 7.8*  HCT 25.4* 26.1* 24.4* 26.3*  MCV 80.2 82.1 82.7 82.4  PLT 263 294 260 811   Basic Metabolic Panel: Recent  Labs  Lab 09/03/17 1108 09/04/17 1200 09/05/17 0506  NA 137 138 137  K 3.9 3.9 4.0  CL 102 104 104  CO2 26 22 23   GLUCOSE 261* 134* 181*  BUN 9 9 6   CREATININE 0.71 0.45 0.49  CALCIUM 9.3 8.8* 8.0*   GFR: Estimated Creatinine Clearance: 98 mL/min (by C-G formula based on SCr of 0.49 mg/dL). Liver Function Tests: Recent Labs  Lab 09/03/17 1108 09/04/17 1200  AST 23 29  ALT 22 26  ALKPHOS 104 89  BILITOT 0.3 0.5  PROT 7.9 7.8  ALBUMIN 3.5 3.5   No results for input(s): LIPASE, AMYLASE in  the last 168 hours. No results for input(s): AMMONIA in the last 168 hours. Coagulation Profile: No results for input(s): INR, PROTIME in the last 168 hours. Cardiac Enzymes: No results for input(s): CKTOTAL, CKMB, CKMBINDEX, TROPONINI in the last 168 hours. BNP (last 3 results) No results for input(s): PROBNP in the last 8760 hours. HbA1C: No results for input(s): HGBA1C in the last 72 hours. CBG: Recent Labs  Lab 09/05/17 0750 09/05/17 1210 09/05/17 1721 09/05/17 2051 09/06/17 0745  GLUCAP 119* 148* 163* 119* 190*   Lipid Profile: No results for input(s): CHOL, HDL, LDLCALC, TRIG, CHOLHDL, LDLDIRECT in the last 72 hours. Thyroid Function Tests: No results for input(s): TSH, T4TOTAL, FREET4, T3FREE, THYROIDAB in the last 72 hours. Anemia Panel: Recent Labs    09/05/17 1526 09/06/17 0520  VITAMINB12  --  713  FOLATE 13.5  --   FERRITIN 289  --   TIBC 270  --   IRON 24*  --   RETICCTPCT 1.6  --    Urine analysis:    Component Value Date/Time   COLORURINE YELLOW 09/04/2017 [place] 09/04/2017 1509   LABSPEC >1.046 (H) 09/04/2017 1509   PHURINE 7.0 09/04/2017 1509   GLUCOSEU NEGATIVE 09/04/2017 1509   HGBUR NEGATIVE 09/04/2017 [place] 09/04/2017 1509   KETONESUR NEGATIVE 09/04/2017 1509   PROTEINUR NEGATIVE 09/04/2017 1509   NITRITE NEGATIVE 09/04/2017 1509   LEUKOCYTESUR NEGATIVE 09/04/2017 1509   Sepsis  Labs: @LABRCNTIP (procalcitonin:4,lacticidven:4) )No results found for this or any previous visit (from the past 240 hour(s)).       Radiology Studies: Dg Chest 2 View  Result Date: 09/04/2017 CLINICAL DATA:  Chest pain, weakness, fever EXAM: CHEST - 2 VIEW COMPARISON:  Chest CT 09/02/2017 FINDINGS: Left Port-A-Cath in place with the tip in the SVC. Heart is normal size. No confluent airspace opacities or effusions. No acute bony abnormality. IMPRESSION: No active cardiopulmonary disease. Electronically Signed   By: Rolm Baptise M.D.   On: 09/04/2017 11:09   Ct Angio Chest Pe W And/or Wo Contrast  Result Date: 09/04/2017 CLINICAL DATA:  Chest pain.  History of breast carcinoma EXAM: CT ANGIOGRAPHY CHEST WITH CONTRAST TECHNIQUE: Multidetector CT imaging of the chest was performed using the standard protocol during bolus administration of intravenous contrast. Multiplanar CT image reconstructions and MIPs were obtained to evaluate the vascular anatomy. CONTRAST:  96mL ISOVUE-370 IOPAMIDOL (ISOVUE-370) INJECTION 76% COMPARISON:  Chest CT Sep 02, 2017 and chest radiograph Sep 04, 2017 FINDINGS: Cardiovascular: There are pulmonary emboli in several subsegmental branches of the right lower lobe pulmonary artery. No more central pulmonary embolus evident. No evidence of right heart strain. No thoracic aortic aneurysm or dissection. The visualized great vessels appear unremarkable. No pericardial effusion or pericardial thickening is evident. Port-A-Cath tip is in the superior vena cava near the cavoatrial junction. Mediastinum/Nodes: Visualized thyroid appears normal. There are scattered subcentimeter mediastinal lymph nodes. There is a subcarinal lymph node measuring 1.7 x 1.1 cm. No other lymph node enlargement noted. No change in mediastinal lymph nodes compared to 2 days prior. No esophageal lesions are evident. Lungs/Pleura: There is subpleural consolidation in the periphery of the right middle lobe,  stable. There is a focal nodular lesion in the medial segment right middle lobe measuring 7 x 7 mm, stable. This nodular opacity is best seen on axial slice 57 series 7 and is stable compared to 2 days prior. No new areas of airspace consolidation are identified. No pleural effusion  or pleural thickening is evident. There is again noted a 2 mm nodular opacity in the periphery of the posterior segment of the left upper lobe near the major fissure. Upper Abdomen: Widespread hepatic metastatic disease again noted. Musculoskeletal: Widespread sclerotic bony metastases again noted. Postoperative change noted in right breast, stable. Review of the MIP images confirms the above findings. IMPRESSION: 1. Small peripheral pulmonary emboli in the right lower lobe pulmonary artery. No more central pulmonary emboli. No right heart strain evident. 2.  Widespread hepatic and bony metastatic disease. 3. Stable consolidation periphery of right middle lobe, unchanged from 2 days prior. Subcentimeter nodular opacities as well as mild adenopathy unchanged from 2 days prior. 4.  Stable postoperative change right breast. Critical Value/emergent results were called by telephone at the time of interpretation on 09/04/2017 at 12:46 pm to Dr. Marda Stalker , who verbally acknowledged these results. Electronically Signed   By: Lowella Grip III M.D.   On: 09/04/2017 12:48   Mr Jeri Cos UR Contrast  Result Date: 09/04/2017 CLINICAL DATA:  42 year old female with metastatic breast cancer. New onset weakness. Small peripheral pulmonary emboli diagnosed on CTA today. EXAM: MRI HEAD WITHOUT AND WITH CONTRAST TECHNIQUE: Multiplanar, multiecho pulse sequences of the brain and surrounding structures were obtained without and with intravenous contrast. CONTRAST:  33mL MULTIHANCE GADOBENATE DIMEGLUMINE 529 MG/ML IV SOLN COMPARISON:  Chest CTA 1222 hours today. Nuclear medicine whole-body bone scan 06/06/2017. FINDINGS: Brain: No abnormal  enhancement identified. No midline shift, mass effect, or evidence of intracranial mass lesion. No dural thickening. Vascular: No restricted diffusion to suggest acute infarction. No ventriculomegaly, extra-axial collection or acute intracranial hemorrhage. Cervicomedullary junction and pituitary are within normal limits. Pear[place] Cables and white matter signal is within normal limits throughout the brain. No chronic cerebral blood products or encephalomalacia. Skull and upper cervical spine: Sclerotic bone metastasis in the C3 vertebral body (series 4, image 12). Heterogeneous bone marrow signal in the calvarium suspicious for scattered skull metastases (including possibly the clivus), but no destructive skull lesion is identified. Negative visible cervical spinal cord. Sinuses/Orbits: Mildly Disconjugate gaze but otherwise normal orbit soft tissues. Paranasal sinuses are clear. Other: Mastoid air cells are clear. Visible internal auditory structures appear normal. Scalp and face soft tissues appear negative. IMPRESSION: 1. No acute intracranial abnormality. No metastatic disease to the brain identified. 2. C3 vertebral body bone metastasis. Bone marrow heterogeneity elsewhere in the skull and cervical spine but no other overt bone lesion identified. Electronically Signed   By: Genevie Ann M.D.   On: 09/04/2017 17:11      Scheduled Meds: . fluticasone furoate-vilanterol  1 puff Inhalation Daily  . insulin aspart  0-9 Units Subcutaneous TID WC  . pantoprazole  40 mg Oral Daily  . rivaroxaban  15 mg Oral BID WC   Followed by  . [START ON 09/26/2017] rivaroxaban  20 mg Oral Q supper  . valACYclovir  1,000 mg Oral TID  . valsartan  80 mg Oral Daily   Continuous Infusions:    LOS: 0 days    Time spent in minutes: 24 min    Debbe Odea, MD [facility] Pager: [email or web address] Password [facility] 09/06/2017, 10:46 AM

## 2017-09-07 DIAGNOSIS — R072 Precordial pain: Secondary | ICD-10-CM | POA: Diagnosis not present

## 2017-09-07 DIAGNOSIS — I2699 Other pulmonary embolism without acute cor pulmonale: Secondary | ICD-10-CM | POA: Diagnosis not present

## 2017-09-07 DIAGNOSIS — C7951 Secondary malignant neoplasm of bone: Secondary | ICD-10-CM | POA: Diagnosis not present

## 2017-09-07 DIAGNOSIS — C50211 Malignant neoplasm of upper-inner quadrant of right female breast: Secondary | ICD-10-CM | POA: Diagnosis not present

## 2017-09-07 LAB — CBC
HCT: 26.3 % — ABNORMAL LOW (ref 36.0–46.0)
Hemoglobin: 7.8 g/dL — ABNORMAL LOW (ref 12.0–15.0)
MCH: 24.5 pg — ABNORMAL LOW (ref 26.0–34.0)
MCHC: 29.7 g/dL — ABNORMAL LOW (ref 30.0–36.0)
MCV: 82.4 fL (ref 78.0–100.0)
Platelets: 299 10*3/uL (ref 150–400)
RBC: 3.19 MIL/uL — ABNORMAL LOW (ref 3.87–5.11)
RDW: 19.2 % — ABNORMAL HIGH (ref 11.5–15.5)
WBC: 8.3 10*3/uL (ref 4.0–10.5)

## 2017-09-07 LAB — GLUCOSE, CAPILLARY
Glucose-Capillary: 149 mg/dL — ABNORMAL HIGH (ref 65–99)
Glucose-Capillary: 176 mg/dL — ABNORMAL HIGH (ref 65–99)

## 2017-09-07 MED ORDER — RIVAROXABAN 20 MG PO TABS: 20.0000 mg | ORAL_TABLET | Freq: Every day | ORAL | 0 refills | Status: AC

## 2017-09-07 MED ORDER — GABAPENTIN 100 MG PO CAPS: 100.0000 mg | ORAL_CAPSULE | Freq: Three times a day (TID) | ORAL | 2 refills | Status: DC

## 2017-09-07 MED ORDER — GABAPENTIN 300 MG PO CAPS: 300.0000 mg | ORAL_CAPSULE | Freq: Every evening | ORAL | 5 refills | Status: DC | PRN

## 2017-09-07 MED ORDER — AZITHROMYCIN 250 MG PO TABS: 250.0000 mg | ORAL_TABLET | Freq: Every day | ORAL | 0 refills | Status: DC

## 2017-09-07 MED ORDER — HEPARIN SOD (PORK) LOCK FLUSH 100 UNIT/ML IV SOLN: 500.0000 [IU] | INTRAVENOUS | Status: DC | PRN

## 2017-09-07 MED ORDER — VALSARTAN 80 MG PO TABS: 80.0000 mg | ORAL_TABLET | Freq: Every day | ORAL | 0 refills | Status: AC

## 2017-09-07 MED ORDER — RIVAROXABAN 15 MG PO TABS: 15.0000 mg | ORAL_TABLET | Freq: Two times a day (BID) | ORAL | 0 refills | Status: DC

## 2017-09-07 NOTE — Discharge Summary (Signed)
Physician Discharge Summary  SHIELA BRUNS UUV:[id number] DOB: 29-Jan-1976 DOA: 09/04/2017  PCP: Lucianne Lei, MD  Admit date: 09/04/2017 Discharge date: 09/09/2017  Admitted From: home  Disposition:  home   Recommendations for Outpatient Follow-up:  1. 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 Cannot tell that she had a low grade fever. Cough is slightly improved. No dysuria, diarrhea or rash.  Brief Summary: Debbie Martinez is a 42 y/o wth metastatic breast cancer, DM2 on oral medications, HTN and asthma.  She presents for left sided aching chest pain (points to precordial area) . She also states she has been having cramping pain in her right chest for a couple of days. In the ED she stated that her chronic cough was worse as well but non-productive.  CT revealed small PEs in right subsegmental arteries and she was referred for admission.     Hospital Course:  Principal Problem:   Pulmonary embolism  - right subsegmental, pulse ox is 100% on room air, no cardiac strain - MRI brain done to r/o mets prior to staring anticoagulation- this was  - venous duplex of LE negative for DVT- official report reviewed - I discussed anticoagulation with Dr Lindi Adie and he prefers Xarelto- I have started it and pharmacy has given her education about it  Active Problems: Fevers- low grade (100.3- 100.5)  - noted on 5/15 , 5/16,  5/17- she is not noticing the fevers - CXR did note a small area of consolidation in the right (in the periphery of the RML) but this was not new- discussed with radiologist who originally read the CT - he suspected post radiation changes which are evolving vs disease progression - no complaints of dysura or diarrhea or rash - due to worsening dry cough and no changes on Xray, will treat for acute bronchitis with Z pak-   resp virus panel was negative - cont to follow fever and cough as outpt- she is advised to check her temp and keep a log of it to show her PCP   Asthma -   ongoing cough for the past few months, she states, has improved after being started on an inhaler (on Symbicort) but then became worse again- see above - no wheezing noted in the hospital   Chest pain - having 2 types of chest pain - right sided pain is cramping and she has not had it today- doubtful it is related to the PE - left sided pain is precordial and a dull ache- chest is tender to palpation in this area   -  it is likely musculoskeletal - Percocet (10 mg) is helping with the left sided chest pain - 2 D ECHO ordered by admitting doc- results noted below- no wall motion abnormalities noted  Shingles - completed a course of Valacyclovir on 5/17  Dehydration  - Hypertension  -  cont ARB (Valsartan - given 2 L NS in ED and then on continuous fluid since- stop HCTZ  Anemia - Hb 8 - dropped to 7 after > 2 L NS- obtain anemia panel- I feel she is diluted and have not ordered any blood transfusion- Hb upto 7.8  - anemia panel consistent with AOCD    Breast cancer of upper-inner quadrant of right female breast metastatic to multiple sites   - receives Eribulin injections at the cancer center   B/l leg pain from hips to feet (anterior legs) - likely musculoskeletal and neuropathy- worsens when she gets chemo- improves with heating pad  - was taking Neurontin 300 mg PRN at home and alternating it with  Oxycodone - I have given her a prescription for 100 mg TID routine and 300 mg at bedtime PRN (which will help with her insomnia as well).     Diabetes  - on Janumet at home-     Port catheter in place    Procedures:   2 D ECHO Study Conclusions  - Left ventricle: The cavity size was normal. Wall thickness was increased in a pattern of mild LVH. Systolic function was normal. The estimated ejection fraction was in the range of 55% to 60%. Wall motion was normal; there were no regional wall motion abnormalities. Left ventricular diastolic function parameters were normal. - Aortic valve: Transvalvular velocity was within the normal range. There was no stenosis. There was no regurgitation. - Mitral valve: Transvalvular velocity was within the normal range. There was no evidence for stenosis. There was mild regurgitation. - Right ventricle: The cavity size was normal. Wall thickness was normal. Systolic function was normal. - Atrial septum: No defect or patent foramen ovale was identified by color flow Doppler. - Tricuspid valve: There was mild regurgitation. - Pulmonary arteries: Systolic pressure was within the normal range. PA peak pressure: 29 mm Hg (S). - Global longitudinal strain -19.2% (normal).    Discharge Exam: Vitals:   09/06/17 2300 09/07/17 0510  BP:  116/68  Pulse:  (!) 102  Resp:  16  Temp: 99.7 F (37.6 C) 98.9 F (37.2 C)  SpO2:  100%   Vitals:   09/06/17 1731 09/06/17 2003 09/06/17 2300 09/07/17 0510  BP:  132/75  116/68  Pulse:  (!) 110  (!) 102  Resp:  16  16  Temp: 98.6 F (37 C) 100.3 F (37.9 C) 99.7 F (37.6 C) 98.9 F (37.2 C)  TempSrc:  Oral Oral Oral  SpO2:  100%  100%  Weight:      Height:        General: Pt is alert, awake, not in acute distress Cardiovascular: RRR, S1/S2 +, no rubs, no gallops Respiratory: CTA bilaterally, no wheezing, no rhonchi Abdominal: Soft, NT, ND, bowel sounds + Extremities: no  edema, no cyanosis   Discharge Instructions  Discharge Instructions    Diet - low sodium heart healthy   Complete by:  As directed    Discharge instructions   Complete by:  As directed    Check your temperature 4 x day and keep a record of this to show your doctor.  Use Gabapentin 300 mg at bedtime for you leg pain and to help you sleep. Do not take it with Ativan initially as it can make you too sleepy.   Increase activity slowly   Complete by:  As directed      Allergies as of 09/07/2017      Reactions   Lisinopril  Swelling, Cough   Swelling under eyes and eyelids    Tamoxifen Itching      Medication List    STOP taking these medications   HALAVEN IV   valACYclovir 1000 MG tablet Commonly known as:  VALTREX   valsartan-hydrochlorothiazide 80-12.5 MG tablet Commonly known as:  DIOVAN-HCT     TAKE these medications   ACCU-CHEK AVIVA PLUS test strip Generic drug:  glucose blood USE AS DIRECTED 4 TIMES A DAY   ACCU-CHEK SOFTCLIX LANCETS lancets 100 each by Other route 4 (four) times daily. Use as instructed   acetaminophen 500 MG tablet Commonly known as:  TYLENOL Take 1,000 mg by mouth every 4 (four) hours as needed for moderate pain.   azithromycin 250 MG tablet Commonly known as:  ZITHROMAX Take 1 tablet (250 mg total) by mouth daily.   blood glucose meter kit and supplies Kit Dispense based on patient and insurance preference. Use up to four times daily as directed. (FOR ICD-9 250.00, 250.01).   BREO ELLIPTA IN Inhale 1 puff into the lungs daily as needed (Shortness of breath).   ENSURE Take 237 mLs by mouth 2 (two) times daily between meals.   fluticasone 50 MCG/ACT nasal spray Commonly known as:  FLONASE Place 2 sprays into both nostrils 2 (two) times daily as needed for allergies.   gabapentin 100 MG capsule Commonly known as:  NEURONTIN Take 1 capsule (100 mg total) by mouth 3 (three) times daily. What changed:  You were already taking a  medication with the same name, and this prescription was added. Make sure you understand how and when to take each.   gabapentin 300 MG capsule Commonly known as:  NEURONTIN Take 1 capsule (300 mg total) by mouth at bedtime as needed. What changed:    when to take this  reasons to take this   Geritol Liqd Take 5 mLs by mouth daily.   IRON PO Take 1 tablet by mouth 2 (two) times daily.   JANUMET 50-500 MG tablet Generic drug:  sitaGLIPtin-metformin Take 1 tablet by mouth daily.   Lidocaine-Prilocaine (Bulk) 2.5-2.5 % Crea Apply 5 mLs topically as needed. 2 hours before use, cover with plawtic wrap.   lidocaine-prilocaine cream Commonly known as:  EMLA Apply to affected area once   LORazepam 0.5 MG tablet Commonly known as:  ATIVAN Take 1 tablet (0.5 mg total) by mouth at bedtime as needed (Nausea or vomiting).   omeprazole 20 MG capsule Commonly known as:  PRILOSEC Take 1 capsule (20 mg total) by mouth daily. Take while using dexamethasone to prevent heartburn. What changed:    when to take this  reasons to take this  additional instructions   ondansetron 8 MG tablet Commonly known as:  ZOFRAN Take 1 tablet (8 mg total) by mouth 2 (two) times daily as needed for refractory nausea / vomiting. Start on day 3 after chemo.   oxyCODONE-acetaminophen 5-325 MG tablet Commonly known as:  PERCOCET/ROXICET Take 1 tablet by mouth every 6 (six) hours as needed for severe pain. What changed:  how much to take   polyethylene glycol packet Commonly known as:  MIRALAX / GLYCOLAX Take 17 g by mouth daily. What changed:    when to take this  reasons to take this   prochlorperazine 10 MG tablet Commonly known as:  COMPAZINE Take 1 tablet (10 mg total) by mouth every 6 (six) hours as needed (Nausea or vomiting).   Rivaroxaban 15 MG Tabs tablet Commonly known as:  XARELTO Take 1 tablet (15 mg total) by mouth 2 (two) times daily with a meal.   rivaroxaban 20 MG Tabs  tablet Commonly known as:  XARELTO Take 1 tablet (20 mg total) by mouth daily with supper. Start taking on:  09/26/2017   valsartan 80 MG tablet Commonly known as:  DIOVAN Take 1 tablet (80 mg total) by mouth daily.   VITAMIN D-3 PO Take 1 capsule by mouth daily.       Allergies  Allergen Reactions  . Lisinopril Swelling and Cough    Swelling under eyes and eyelids    . Tamoxifen Itching     Procedures/Studies:    Dg Chest 2 View  Result Date: 09/04/2017 CLINICAL DATA:  Chest pain, weakness, fever EXAM: CHEST - 2 VIEW COMPARISON:  Chest CT 09/02/2017 FINDINGS: Left Port-A-Cath in place with the tip in the SVC. Heart is normal size. No confluent airspace opacities or effusions. No acute bony abnormality. IMPRESSION: No active cardiopulmonary disease. Electronically Signed   By: Rolm Baptise M.D.   On: 09/04/2017 11:09   Ct Chest W Contrast  Result Date: 09/02/2017 CLINICAL DATA:  Patient with history of recurrent breast cancer. On chemotherapy. Follow-up evaluation. EXAM: CT CHEST, ABDOMEN, AND PELVIS WITH CONTRAST TECHNIQUE: Multidetector CT imaging of the chest, abdomen and pelvis was performed following the standard protocol during bolus administration of intravenous contrast. CONTRAST:  1100m OMNIPAQUE IOHEXOL 300 MG/ML  SOLN COMPARISON:  CT CAP 06/06/2017 FINDINGS: CT CHEST FINDINGS Cardiovascular: Left anterior chest wall Port-A-Cath is present with tip terminating in the superior vena cava. Normal heart size. Trace fluid superior pericardial recess. Mediastinum/Nodes: Postsurgical changes right axilla. No left axillary adenopathy. Interval development of mediastinal lymph nodes including an 11 mm subcarinal node (image 22; series 2), previously 5 mm. New 7 mm prevascular lymph node (image 21; series 2), previously 3 mm. Lungs/Pleura: Central airways are patent. Dependent atelectasis within the bilateral lower lobes. New 2 mm nodule along left fissure (image 50; series 6). New 7  mm right upper lobe nodule (image 67; series 6). Re-demonstrated subpleural consolidation peripheral right middle lobe. No pneumothorax. Musculoskeletal: Unchanged postsurgical changes medial right breast. Interval increase in sclerotic lesions throughout the visualized skeleton including the thoracic spine and proximal left humerus. Reference 8 mm sclerotic lesion proximal left humerus (image 5; series 6). Increase in sclerosis within the T8 vertebral body (image 79; series 5). CT ABDOMEN PELVIS FINDINGS Hepatobiliary: Interval increase in size of multiple low-attenuation lesions throughout the liver. Reference lesion within the medial left hepatic lobe measures 2.8 x 3.2 cm (image 46; series 2), previously 1.4 x 1.5 cm. Reference lesion within the left hepatic lobe measures 1.9 x 1.9 cm (image 53; series 2), previously 0.9 x 0.9 cm. Gallbladder is unremarkable. No intrahepatic or extrahepatic biliary ductal dilatation. Pancreas: Unremarkable Spleen: Unremarkable Adrenals/Urinary Tract: Normal adrenal glands. Kidneys enhance symmetrically with contrast. Unchanged 1.1 cm low-attenuation lesion superior pole left kidney. No hydronephrosis. Urinary bladder is unremarkable. Stomach/Bowel: No abnormal bowel wall thickening or evidence for bowel obstruction. Normal morphology of the stomach. No free fluid or free intraperitoneal air. Vascular/Lymphatic: Normal caliber abdominal aorta. No retroperitoneal lymphadenopathy. Slight interval increase in size of porta hepatic lymph node measuring 1.0 cm, previously 0.9 cm (image 54; series 2). Reproductive: Uterus and adnexal structures are unremarkable. Other: None. Musculoskeletal: Re- demonstrated L4 compression fracture. Overall similar lucent and sclerotic lesions throughout the lumbar spine and pelvis. IMPRESSION: 1. Interval increase in size and number of multiple low-attenuation lesions  throughout the liver compatible with hepatic metastatic disease. 2. Interval  development of multiple prominent and mildly enlarged mediastinal lymph nodes, new from prior, most compatible with nodal metastatic disease. 3. Interval development of pulmonary nodules concerning for metastatic disease. 4. Interval increase in sclerotic lesions involving the visualized humerus and upper thoracic spine. Additional lucent and sclerotic lesions throughout the skeleton are similar when compared to prior exam. Electronically Signed   By: Lovey Newcomer M.D.   On: 09/02/2017 13:42   Ct Angio Chest Pe W And/or Wo Contrast  Result Date: 09/04/2017 CLINICAL DATA:  Chest pain.  History of breast carcinoma EXAM: CT ANGIOGRAPHY CHEST WITH CONTRAST TECHNIQUE: Multidetector CT imaging of the chest was performed using the standard protocol during bolus administration of intravenous contrast. Multiplanar CT image reconstructions and MIPs were obtained to evaluate the vascular anatomy. CONTRAST:  40m ISOVUE-370 IOPAMIDOL (ISOVUE-370) INJECTION 76% COMPARISON:  Chest CT Sep 02, 2017 and chest radiograph Sep 04, 2017 FINDINGS: Cardiovascular: There are pulmonary emboli in several subsegmental branches of the right lower lobe pulmonary artery. No more central pulmonary embolus evident. No evidence of right heart strain. No thoracic aortic aneurysm or dissection. The visualized great vessels appear unremarkable. No pericardial effusion or pericardial thickening is evident. Port-A-Cath tip is in the superior vena cava near the cavoatrial junction. Mediastinum/Nodes: Visualized thyroid appears normal. There are scattered subcentimeter mediastinal lymph nodes. There is a subcarinal lymph node measuring 1.7 x 1.1 cm. No other lymph node enlargement noted. No change in mediastinal lymph nodes compared to 2 days prior. No esophageal lesions are evident. Lungs/Pleura: There is subpleural consolidation in the periphery of the right middle lobe, stable. There is a focal nodular lesion in the medial segment right middle lobe  measuring 7 x 7 mm, stable. This nodular opacity is best seen on axial slice 57 series 7 and is stable compared to 2 days prior. No new areas of airspace consolidation are identified. No pleural effusion or pleural thickening is evident. There is again noted a 2 mm nodular opacity in the periphery of the posterior segment of the left upper lobe near the major fissure. Upper Abdomen: Widespread hepatic metastatic disease again noted. Musculoskeletal: Widespread sclerotic bony metastases again noted. Postoperative change noted in right breast, stable. Review of the MIP images confirms the above findings. IMPRESSION: 1. Small peripheral pulmonary emboli in the right lower lobe pulmonary artery. No more central pulmonary emboli. No right heart strain evident. 2.  Widespread hepatic and bony metastatic disease. 3. Stable consolidation periphery of right middle lobe, unchanged from 2 days prior. Subcentimeter nodular opacities as well as mild adenopathy unchanged from 2 days prior. 4.  Stable postoperative change right breast. Critical Value/emergent results were called by telephone at the time of interpretation on 09/04/2017 at 12:46 pm to Dr. CMarda Stalker, who verbally acknowledged these results. Electronically Signed   By: WLowella GripIII M.D.   On: 09/04/2017 12:48   Mr BJeri CosWBMContrast  Result Date: 09/04/2017 CLINICAL DATA:  42year old female with metastatic breast cancer. New onset weakness. Small peripheral pulmonary emboli diagnosed on CTA today. EXAM: MRI HEAD WITHOUT AND WITH CONTRAST TECHNIQUE: Multiplanar, multiecho pulse sequences of the brain and surrounding structures were obtained without and with intravenous contrast. CONTRAST:  141mMULTIHANCE GADOBENATE DIMEGLUMINE 529 MG/ML IV SOLN COMPARISON:  Chest CTA 1222 hours today. Nuclear medicine whole-body bone scan 06/06/2017. FINDINGS: Brain: No abnormal enhancement identified. No midline shift, mass effect, or evidence of intracranial  mass  lesion. No dural thickening. Vascular: No restricted diffusion to suggest acute infarction. No ventriculomegaly, extra-axial collection or acute intracranial hemorrhage. Cervicomedullary junction and pituitary are within normal limits. Pearline Cables and white matter signal is within normal limits throughout the brain. No chronic cerebral blood products or encephalomalacia. Skull and upper cervical spine: Sclerotic bone metastasis in the C3 vertebral body (series 4, image 12). Heterogeneous bone marrow signal in the calvarium suspicious for scattered skull metastases (including possibly the clivus), but no destructive skull lesion is identified. Negative visible cervical spinal cord. Sinuses/Orbits: Mildly Disconjugate gaze but otherwise normal orbit soft tissues. Paranasal sinuses are clear. Other: Mastoid air cells are clear. Visible internal auditory structures appear normal. Scalp and face soft tissues appear negative. IMPRESSION: 1. No acute intracranial abnormality. No metastatic disease to the brain identified. 2. C3 vertebral body bone metastasis. Bone marrow heterogeneity elsewhere in the skull and cervical spine but no other overt bone lesion identified. Electronically Signed   By: Genevie Ann M.D.   On: 09/04/2017 17:11   Ct Abdomen Pelvis W Contrast  Result Date: 09/02/2017 CLINICAL DATA:  Patient with history of recurrent breast cancer. On chemotherapy. Follow-up evaluation. EXAM: CT CHEST, ABDOMEN, AND PELVIS WITH CONTRAST TECHNIQUE: Multidetector CT imaging of the chest, abdomen and pelvis was performed following the standard protocol during bolus administration of intravenous contrast. CONTRAST:  144m OMNIPAQUE IOHEXOL 300 MG/ML  SOLN COMPARISON:  CT CAP 06/06/2017 FINDINGS: CT CHEST FINDINGS Cardiovascular: Left anterior chest wall Port-A-Cath is present with tip terminating in the superior vena cava. Normal heart size. Trace fluid superior pericardial recess. Mediastinum/Nodes: Postsurgical changes  right axilla. No left axillary adenopathy. Interval development of mediastinal lymph nodes including an 11 mm subcarinal node (image 22; series 2), previously 5 mm. New 7 mm prevascular lymph node (image 21; series 2), previously 3 mm. Lungs/Pleura: Central airways are patent. Dependent atelectasis within the bilateral lower lobes. New 2 mm nodule along left fissure (image 50; series 6). New 7 mm right upper lobe nodule (image 67; series 6). Re-demonstrated subpleural consolidation peripheral right middle lobe. No pneumothorax. Musculoskeletal: Unchanged postsurgical changes medial right breast. Interval increase in sclerotic lesions throughout the visualized skeleton including the thoracic spine and proximal left humerus. Reference 8 mm sclerotic lesion proximal left humerus (image 5; series 6). Increase in sclerosis within the T8 vertebral body (image 79; series 5). CT ABDOMEN PELVIS FINDINGS Hepatobiliary: Interval increase in size of multiple low-attenuation lesions throughout the liver. Reference lesion within the medial left hepatic lobe measures 2.8 x 3.2 cm (image 46; series 2), previously 1.4 x 1.5 cm. Reference lesion within the left hepatic lobe measures 1.9 x 1.9 cm (image 53; series 2), previously 0.9 x 0.9 cm. Gallbladder is unremarkable. No intrahepatic or extrahepatic biliary ductal dilatation. Pancreas: Unremarkable Spleen: Unremarkable Adrenals/Urinary Tract: Normal adrenal glands. Kidneys enhance symmetrically with contrast. Unchanged 1.1 cm low-attenuation lesion superior pole left kidney. No hydronephrosis. Urinary bladder is unremarkable. Stomach/Bowel: No abnormal bowel wall thickening or evidence for bowel obstruction. Normal morphology of the stomach. No free fluid or free intraperitoneal air. Vascular/Lymphatic: Normal caliber abdominal aorta. No retroperitoneal lymphadenopathy. Slight interval increase in size of porta hepatic lymph node measuring 1.0 cm, previously 0.9 cm (image 54;  series 2). Reproductive: Uterus and adnexal structures are unremarkable. Other: None. Musculoskeletal: Re- demonstrated L4 compression fracture. Overall similar lucent and sclerotic lesions throughout the lumbar spine and pelvis. IMPRESSION: 1. Interval increase in size and number of multiple low-attenuation lesions throughout the liver compatible with hepatic  metastatic disease. 2. Interval development of multiple prominent and mildly enlarged mediastinal lymph nodes, new from prior, most compatible with nodal metastatic disease. 3. Interval development of pulmonary nodules concerning for metastatic disease. 4. Interval increase in sclerotic lesions involving the visualized humerus and upper thoracic spine. Additional lucent and sclerotic lesions throughout the skeleton are similar when compared to prior exam. Electronically Signed   By: Lovey Newcomer M.D.   On: 09/02/2017 13:42     The results of significant diagnostics from this hospitalization (including imaging, microbiology, ancillary and laboratory) are listed below for reference.     Microbiology: Recent Results (from the past 240 hour(s))  Respiratory Panel by PCR     Status: None   Collection Time: 09/06/17  1:39 PM  Result Value Ref Range Status   Adenovirus NOT DETECTED NOT DETECTED Final   Coronavirus 229E NOT DETECTED NOT DETECTED Final   Coronavirus HKU1 NOT DETECTED NOT DETECTED Final   Coronavirus NL63 NOT DETECTED NOT DETECTED Final   Coronavirus OC43 NOT DETECTED NOT DETECTED Final   Metapneumovirus NOT DETECTED NOT DETECTED Final   Rhinovirus / Enterovirus NOT DETECTED NOT DETECTED Final   Influenza A NOT DETECTED NOT DETECTED Final   Influenza B NOT DETECTED NOT DETECTED Final   Parainfluenza Virus 1 NOT DETECTED NOT DETECTED Final   Parainfluenza Virus 2 NOT DETECTED NOT DETECTED Final   Parainfluenza Virus 3 NOT DETECTED NOT DETECTED Final   Parainfluenza Virus 4 NOT DETECTED NOT DETECTED Final   Respiratory Syncytial  Virus NOT DETECTED NOT DETECTED Final   Bordetella pertussis NOT DETECTED NOT DETECTED Final   Chlamydophila pneumoniae NOT DETECTED NOT DETECTED Final   Mycoplasma pneumoniae NOT DETECTED NOT DETECTED Final    Comment: Performed at [facility], [place] [address], [place], [place] [address]     Labs: BNP (last 3 results) No results for input(s): BNP in the last 8760 hours. Basic Metabolic Panel: Recent Labs  Lab 09/03/17 1108 09/04/17 1200 09/05/17 0506  NA 137 138 137  K 3.9 3.9 4.0  CL 102 104 104  CO2 '26 22 23  ' GLUCOSE 261* 134* 181*  BUN '9 9 6  ' CREATININE 0.71 0.45 0.49  CALCIUM 9.3 8.8* 8.0*   Liver Function Tests: Recent Labs  Lab 09/03/17 1108 09/04/17 1200  AST 23 29  ALT 22 26  ALKPHOS 104 89  BILITOT 0.3 0.5  PROT 7.9 7.8  ALBUMIN 3.5 3.5   No results for input(s): LIPASE, AMYLASE in the last 168 hours. No results for input(s): AMMONIA in the last 168 hours. CBC: Recent Labs  Lab 09/03/17 1108 09/04/17 1200 09/05/17 0506 09/06/17 0520 09/07/17 0800  WBC 5.5 8.1 7.5 8.3 8.3  NEUTROABS 4.4  --   --   --   --   HGB 8.2* 8.0* 7.3* 7.8* 7.8*  HCT 25.4* 26.1* 24.4* 26.3* 26.3*  MCV 80.2 82.1 82.7 82.4 82.4  PLT 263 294 260 278 299   Cardiac Enzymes: No results for input(s): CKTOTAL, CKMB, CKMBINDEX, TROPONINI in the last 168 hours. BNP: Invalid input(s): POCBNP CBG: Recent Labs  Lab 09/06/17 1205 09/06/17 1647 09/06/17 2013 09/07/17 0822 09/07/17 1210  GLUCAP 111* 163* 125* 149* 176*   D-Dimer No results for input(s): DDIMER in the last 72 hours. Hgb A1c No results for input(s): HGBA1C in the last 72 hours. Lipid Profile No results for input(s): CHOL, HDL, LDLCALC, TRIG, CHOLHDL, LDLDIRECT in the last 72 hours. Thyroid function studies No results for input(s):  TSH, T4TOTAL, T3FREE, THYROIDAB in the last 72 hours.  Invalid input(s): FREET3 Anemia work up No results for input(s): VITAMINB12, FOLATE, FERRITIN, TIBC, IRON, RETICCTPCT  in the last 72 hours. Urinalysis    Component Value Date/Time   COLORURINE YELLOW 09/04/2017 [place] 09/04/2017 1509   LABSPEC >1.046 (H) 09/04/2017 1509   PHURINE 7.0 09/04/2017 1509   GLUCOSEU NEGATIVE 09/04/2017 1509   HGBUR NEGATIVE 09/04/2017 1509   [place] 09/04/2017 1509   KETONESUR NEGATIVE 09/04/2017 1509   PROTEINUR NEGATIVE 09/04/2017 1509   NITRITE NEGATIVE 09/04/2017 1509   LEUKOCYTESUR NEGATIVE 09/04/2017 1509   Sepsis Labs Invalid input(s): PROCALCITONIN,  WBC,  LACTICIDVEN Microbiology Recent Results (from the past 240 hour(s))  Respiratory Panel by PCR     Status: None   Collection Time: 09/06/17  1:39 PM  Result Value Ref Range Status   Adenovirus NOT DETECTED NOT DETECTED Final   Coronavirus 229E NOT DETECTED NOT DETECTED Final   Coronavirus HKU1 NOT DETECTED NOT DETECTED Final   Coronavirus NL63 NOT DETECTED NOT DETECTED Final   Coronavirus OC43 NOT DETECTED NOT DETECTED Final   Metapneumovirus NOT DETECTED NOT DETECTED Final   Rhinovirus / Enterovirus NOT DETECTED NOT DETECTED Final   Influenza A NOT DETECTED NOT DETECTED Final   Influenza B NOT DETECTED NOT DETECTED Final   Parainfluenza Virus 1 NOT DETECTED NOT DETECTED Final   Parainfluenza Virus 2 NOT DETECTED NOT DETECTED Final   Parainfluenza Virus 3 NOT DETECTED NOT DETECTED Final   Parainfluenza Virus 4 NOT DETECTED NOT DETECTED Final   Respiratory Syncytial Virus NOT DETECTED NOT DETECTED Final   Bordetella pertussis NOT DETECTED NOT DETECTED Final   Chlamydophila pneumoniae NOT DETECTED NOT DETECTED Final   Mycoplasma pneumoniae NOT DETECTED NOT DETECTED Final    Comment: Performed at [facility], [place] [address], [place], [place] [address]     Time coordinating discharge in minutes: 52  SIGNED:   Debbe Odea, MD  [facility] 09/09/2017, 1:44 PM Pager   If 7PM-7AM, please contact night-coverage [email or web address] Password [email or web address]

## 2017-09-09 DIAGNOSIS — R509 Fever, unspecified: Secondary | ICD-10-CM

## 2017-09-10 ENCOUNTER — Inpatient Hospital Stay: Payer: Medicare Other

## 2017-09-10 ENCOUNTER — Inpatient Hospital Stay (HOSPITAL_BASED_OUTPATIENT_CLINIC_OR_DEPARTMENT_OTHER): Payer: Medicare Other | Admitting: Medical

## 2017-09-10 VITALS — BP 123/85 | HR 132 | Temp 99.1°F | Resp 18

## 2017-09-10 DIAGNOSIS — M5432 Sciatica, left side: Secondary | ICD-10-CM | POA: Diagnosis not present

## 2017-09-10 DIAGNOSIS — E119 Type 2 diabetes mellitus without complications: Secondary | ICD-10-CM

## 2017-09-10 DIAGNOSIS — C50211 Malignant neoplasm of upper-inner quadrant of right female breast: Secondary | ICD-10-CM | POA: Diagnosis not present

## 2017-09-10 DIAGNOSIS — Z95828 Presence of other vascular implants and grafts: Secondary | ICD-10-CM

## 2017-09-10 DIAGNOSIS — C773 Secondary and unspecified malignant neoplasm of axilla and upper limb lymph nodes: Secondary | ICD-10-CM | POA: Diagnosis not present

## 2017-09-10 DIAGNOSIS — D6481 Anemia due to antineoplastic chemotherapy: Secondary | ICD-10-CM

## 2017-09-10 DIAGNOSIS — Z7901 Long term (current) use of anticoagulants: Secondary | ICD-10-CM | POA: Diagnosis not present

## 2017-09-10 DIAGNOSIS — C50911 Malignant neoplasm of unspecified site of right female breast: Secondary | ICD-10-CM

## 2017-09-10 DIAGNOSIS — I2699 Other pulmonary embolism without acute cor pulmonale: Secondary | ICD-10-CM | POA: Diagnosis not present

## 2017-09-10 DIAGNOSIS — C787 Secondary malignant neoplasm of liver and intrahepatic bile duct: Secondary | ICD-10-CM

## 2017-09-10 DIAGNOSIS — T451X5A Adverse effect of antineoplastic and immunosuppressive drugs, initial encounter: Secondary | ICD-10-CM

## 2017-09-10 DIAGNOSIS — C7951 Secondary malignant neoplasm of bone: Secondary | ICD-10-CM

## 2017-09-10 DIAGNOSIS — C50919 Malignant neoplasm of unspecified site of unspecified female breast: Secondary | ICD-10-CM

## 2017-09-10 DIAGNOSIS — Z170 Estrogen receptor positive status [ER+]: Secondary | ICD-10-CM

## 2017-09-10 DIAGNOSIS — B029 Zoster without complications: Secondary | ICD-10-CM | POA: Diagnosis not present

## 2017-09-10 DIAGNOSIS — M2550 Pain in unspecified joint: Secondary | ICD-10-CM | POA: Diagnosis not present

## 2017-09-10 LAB — CMP (CANCER CENTER ONLY)
ALT: 29 U/L (ref 0–55)
AST: 29 U/L (ref 5–34)
Albumin: 3.4 g/dL — ABNORMAL LOW (ref 3.5–5.0)
Alkaline Phosphatase: 111 U/L (ref 40–150)
Anion gap: 11 (ref 3–11)
BILIRUBIN TOTAL: 0.5 mg/dL (ref 0.2–1.2)
BUN: 11 mg/dL (ref 7–26)
CHLORIDE: 102 mmol/L (ref 98–109)
CO2: 22 mmol/L (ref 22–29)
Calcium: 9.2 mg/dL (ref 8.4–10.4)
Creatinine: 0.72 mg/dL (ref 0.60–1.10)
GFR, Est AFR Am: >60 mL/min (ref >60)
Glucose, Bld: 221 mg/dL — ABNORMAL HIGH (ref 70–140)
POTASSIUM: 4.1 mmol/L (ref 3.5–5.1)
Sodium: 135 mmol/L — ABNORMAL LOW (ref 136–145)
Total Protein: 8.6 g/dL — ABNORMAL HIGH (ref 6.4–8.3)

## 2017-09-10 LAB — CBC WITH DIFFERENTIAL (CANCER CENTER ONLY)
BASOS ABS: 0 10*3/uL (ref 0.0–0.1)
Basophils Relative: 0 %
EOS ABS: 0.1 10*3/uL (ref 0.0–0.5)
EOS PCT: 1 %
HCT: 26.2 % — ABNORMAL LOW (ref 34.8–46.6)
Hemoglobin: 7.9 g/dL — ABNORMAL LOW (ref 11.6–15.9)
LYMPHS ABS: 1.1 10*3/uL (ref 0.9–3.3)
LYMPHS PCT: 10 %
MCH: 24.4 pg — ABNORMAL LOW (ref 25.1–34.0)
MCHC: 30.2 g/dL — ABNORMAL LOW (ref 31.5–36.0)
MCV: 80.9 fL (ref 79.5–101.0)
MONO ABS: 0.9 10*3/uL (ref 0.1–0.9)
Monocytes Relative: 8 %
Neutro Abs: 8.7 10*3/uL — ABNORMAL HIGH (ref 1.5–6.5)
Neutrophils Relative %: 81 %
PLATELETS: 338 10*3/uL (ref 145–400)
RBC: 3.24 MIL/uL — AB (ref 3.70–5.45)
RDW: 19.8 % — AB (ref 11.2–14.5)
WBC Count: 10.8 10*3/uL — ABNORMAL HIGH (ref 3.9–10.3)

## 2017-09-10 LAB — ABO/RH: ABO/RH(D): A POS

## 2017-09-10 MED ORDER — MORPHINE SULFATE (PF) 4 MG/ML IV SOLN
INTRAVENOUS | Status: AC
  Filled 2017-09-10: qty 1

## 2017-09-10 MED ORDER — FENTANYL 25 MCG/HR TD PT72: 25.0000 ug | MEDICATED_PATCH | TRANSDERMAL | 0 refills | Status: DC

## 2017-09-10 MED ORDER — MORPHINE SULFATE 4 MG/ML IJ SOLN
1.0000 mg | Freq: Once | INTRAMUSCULAR | Status: AC
  Administered 2017-09-10: 1 mg via INTRAVENOUS
  Filled 2017-09-10: qty 1

## 2017-09-10 MED ORDER — TRAMADOL HCL 50 MG PO TABS: 50.0000 mg | ORAL_TABLET | Freq: Four times a day (QID) | ORAL | 0 refills | Status: DC | PRN

## 2017-09-10 MED ORDER — SODIUM CHLORIDE 0.9% FLUSH
10.0000 mL | Freq: Once | INTRAVENOUS | Status: AC
  Administered 2017-09-10: 10 mL
  Filled 2017-09-10: qty 10

## 2017-09-10 MED ORDER — HEPARIN SOD (PORK) LOCK FLUSH 100 UNIT/ML IV SOLN
500.0000 [IU] | Freq: Once | INTRAVENOUS | Status: AC
  Administered 2017-09-10: 500 [IU]
  Filled 2017-09-10: qty 5

## 2017-09-10 MED ORDER — GABAPENTIN 300 MG PO CAPS: 300.0000 mg | ORAL_CAPSULE | Freq: Three times a day (TID) | ORAL | 5 refills | Status: DC

## 2017-09-10 MED FILL — traMADol HCL 50 MG TABS: 50 | 12 days supply | Qty: 45 | Fill #0

## 2017-09-10 MED FILL — fentaNYL 25 MCG/HR PT72: 25 | 30 days supply | Qty: 10 | Fill #0

## 2017-09-10 NOTE — Patient Instructions (Signed)
Anemia Anemia is a condition in which you do not have enough red blood cells or hemoglobin. Hemoglobin is a substance in red blood cells that carries oxygen. When you do not have enough red blood cells or hemoglobin (are anemic), your body cannot get enough oxygen and your organs may not work properly. As a result, you may feel very tired or have other problems. What are the causes? Common causes of anemia include:  Excessive bleeding. Anemia can be caused by excessive bleeding inside or outside the body, including bleeding from the intestine or from periods in women.  Poor nutrition.  Long-lasting (chronic) kidney, thyroid, and liver disease.  Bone marrow disorders.  Cancer and treatments for cancer.  HIV (human immunodeficiency virus) and AIDS (acquired immunodeficiency syndrome).  Treatments for HIV and AIDS.  Spleen problems.  Blood disorders.  Infections, medicines, and autoimmune disorders that destroy red blood cells.  What are the signs or symptoms? Symptoms of this condition include:  Minor weakness.  Dizziness.  Headache.  Feeling heartbeats that are irregular or faster than normal (palpitations).  Shortness of breath, especially with exercise.  Paleness.  Cold sensitivity.  Indigestion.  Nausea.  Difficulty sleeping.  Difficulty concentrating.  Symptoms may occur suddenly or develop slowly. If your anemia is mild, you may not have symptoms. How is this diagnosed? This condition is diagnosed based on:  Blood tests.  Your medical history.  A physical exam.  Bone marrow biopsy.  Your health care provider may also check your stool (feces) for blood and may do additional testing to look for the cause of your bleeding. You may also have other tests, including:  Imaging tests, such as a CT scan or MRI.  Endoscopy.  Colonoscopy.  How is this treated? Treatment for this condition depends on the cause. If you continue to lose a lot of blood,  you may need to be treated at a hospital. Treatment may include:  Taking supplements of iron, vitamin B12, or folic acid.  Taking a hormone medicine (erythropoietin) that can help to stimulate red blood cell growth.  Having a blood transfusion. This may be needed if you lose a lot of blood.  Making changes to your diet.  Having surgery to remove your spleen.  Follow these instructions at home:  Take over-the-counter and prescription medicines only as told by your health care provider.  Take supplements only as told by your health care provider.  Follow any diet instructions that you were given.  Keep all follow-up visits as told by your health care provider. This is important. Contact a health care provider if:  You develop new bleeding anywhere in the body. Get help right away if:  You are very weak.  You are short of breath.  You have pain in your abdomen or chest.  You are dizzy or feel faint.  You have trouble concentrating.  You have bloody or black, tarry stools.  You vomit repeatedly or you vomit up blood. Summary  Anemia is a condition in which you do not have enough red blood cells or enough of a substance in your red blood cells that carries oxygen (hemoglobin).  Symptoms may occur suddenly or develop slowly.  If your anemia is mild, you may not have symptoms.  This condition is diagnosed with blood tests as well as a medical history and physical exam. Other tests may be needed.  Treatment for this condition depends on the cause of the anemia. This information is not intended to replace advice   given to you by your health care provider. Make sure you discuss any questions you have with your health care provider. Document Released: 05/17/2004 Document Revised: 05/11/2016 Document Reviewed: 05/11/2016 Elsevier Interactive Patient Education  [employer].

## 2017-09-10 NOTE — Progress Notes (Signed)
Pt here for chemo infusion today but presents with extreme leg pain and dry heaving in lobby.  To be seen in [facility] by PA Lucianne Lei.  Reports extreme leg pain mostly on L side, nausea, and fatigue.   Pt to have 2 units RBC infused tomorrow in [facility].  Blood band in place.  Pt verbalized understanding of appt time and need to keep band on for transfusion.

## 2017-09-11 ENCOUNTER — Ambulatory Visit (HOSPITAL_COMMUNITY)
Admission: RE | Admit: 2017-09-11 | Discharge: 2017-09-11 | Disposition: A | Discharge status: Home / Self Care | Payer: Medicare Other | Source: Ambulatory Visit | Attending: Hematology and Oncology | Admitting: Hematology and Oncology

## 2017-09-11 DIAGNOSIS — D509 Iron deficiency anemia, unspecified: Secondary | ICD-10-CM | POA: Diagnosis not present

## 2017-09-11 DIAGNOSIS — D6481 Anemia due to antineoplastic chemotherapy: Secondary | ICD-10-CM

## 2017-09-11 DIAGNOSIS — T451X5A Adverse effect of antineoplastic and immunosuppressive drugs, initial encounter: Secondary | ICD-10-CM

## 2017-09-11 LAB — PREPARE RBC (CROSSMATCH)

## 2017-09-11 MED ORDER — HEPARIN SOD (PORK) LOCK FLUSH 100 UNIT/ML IV SOLN
250.0000 [IU] | INTRAVENOUS | Status: AC | PRN
  Administered 2017-09-11: 250 [IU]
  Filled 2017-09-11: qty 5

## 2017-09-11 MED ORDER — DIPHENHYDRAMINE HCL 25 MG PO CAPS
25.0000 mg | ORAL_CAPSULE | Freq: Once | ORAL | Status: AC
  Administered 2017-09-11: 25 mg via ORAL
  Filled 2017-09-11: qty 1

## 2017-09-11 MED ORDER — ACETAMINOPHEN 325 MG PO TABS
650.0000 mg | ORAL_TABLET | Freq: Once | ORAL | Status: AC
  Administered 2017-09-11: 650 mg via ORAL
  Filled 2017-09-11: qty 2

## 2017-09-11 MED ORDER — SODIUM CHLORIDE 0.9 % IV SOLN
250.0000 mL | Freq: Once | INTRAVENOUS | Status: AC
  Administered 2017-09-11: 250 mL via INTRAVENOUS

## 2017-09-11 NOTE — Discharge Instructions (Signed)
Blood Transfusion A blood transfusion is a procedure in which you are given blood through an IV tube. You may need this procedure because of:  Illness.  Surgery.  Injury.  The blood may come from someone else (a donor). You may also be able to donate blood for yourself (autologous blood donation). The blood given in a transfusion is made up of different types of cells. You may get:  Red blood cells. These carry oxygen to the cells in the body.  White blood cells. These help you fight infections.  Platelets. These help your blood to clot.  Plasma. This is the liquid part of your blood. It helps with fluid imbalances.  If you have a clotting disorder, you may also get other types of blood products. What happens before the procedure?  You will have a blood test to find out your blood type. The test also finds out what type of blood your body will accept and matches it to the donor type.  If you are going to have a planned surgery, you may be able to donate your own blood. This may be done in case you need a transfusion.  If you have had an allergic reaction to a transfusion in the past, you may be given medicine to help prevent a reaction. This medicine may be given to you by mouth or through an IV.  You will have your temperature, blood pressure, and pulse checked.  Follow instructions from your doctor about what you cannot eat or drink.  Ask your doctor about: ? Changing or stopping your regular medicines. This is important if you take diabetes medicines or blood thinners. ? Taking medicines such as aspirin and ibuprofen. These medicines can thin your blood. Do not take these medicines before your procedure if your doctor tells you not to. What happens during the procedure?  An IV tube will be put into one of your veins.  The bag of donated blood will be attached to your IV tube. Then, the blood will enter through your vein.  Your temperature, blood pressure, and pulse will be  checked regularly during the procedure. This is done to find early signs of a transfusion reaction.  If you have any signs or symptoms of a reaction, your transfusion will be stopped. You may also be given medicine.  When the transfusion is done, your IV tube will be taken out.  Pressure may be applied to the IV site for a few minutes.  A bandage (dressing) will be put on the IV site. The procedure may vary among doctors and hospitals. What happens after the procedure?  Your temperature, blood pressure, heart rate, breathing rate, and blood oxygen level will be checked often.  Your blood may be tested to see how you are responding to the transfusion.  You may be warmed with fluids or blankets. This is done to keep the temperature of your body normal. Summary  A blood transfusion is a procedure in which you are given blood through an IV tube.  The blood may come from someone else (a donor). You may also be able to donate blood for yourself.  If you have had an allergic reaction to a transfusion in the past, you may be given medicine to help prevent a reaction. This medicine may be given to you by mouth or through an IV tube.  Your temperature, blood pressure, heart rate, breathing rate, and blood oxygen level will be checked often.  Your blood may be tested to   see how you are responding to the transfusion. This information is not intended to replace advice given to you by your health care provider. Make sure you discuss any questions you have with your health care provider. Document Released: 07/06/2008 Document Revised: 12/02/2015 Document Reviewed: 12/02/2015 Elsevier Interactive Patient Education  2017 [employer]  

## 2017-09-11 NOTE — Progress Notes (Signed)
PATIENT CARE CENTER NOTE  Diagnosis: Iron Deficiency Anemia    Provider: Dr. Lindi Adie    Procedure: 2 units PRBC's    Note: Patient received 2 units of blood. Pre-medications given before transfusion. Patient tolerated transfusion well with no adverse reaction. Discharge instructions given to patient. Patient alert, oriented and ambulatory to wheelchair at discharge.

## 2017-09-12 LAB — BPAM RBC
Blood Product Expiration Date: 201906062359
Blood Product Expiration Date: 201906062359
ISSUE DATE / TIME: 201905221025
ISSUE DATE / TIME: 201905221025
UNIT TYPE AND RH: 6200
Unit Type and Rh: 6200

## 2017-09-12 LAB — TYPE AND SCREEN
ABO/RH(D): A POS
ANTIBODY SCREEN: NEGATIVE
UNIT DIVISION: 0
UNIT DIVISION: 0

## 2017-09-12 NOTE — Progress Notes (Signed)
[facility] Progress Note   [name] [id number] May 05, 1975 42 y.o.  Debbie Martinez Strupp is managed by Dr. [name]  Actively treated with chemotherapy: yes  Current Therapy: Halaven   Assessment: Plan:    Malignant neoplasm of upper-inner quadrant of right breast in female, estrogen receptor positive ([place]) - Plan: morphine 4 MG/ML injection 1 mg, morphine 4 MG/ML injection 1 mg  Anemia due to antineoplastic chemotherapy - Plan: Type and screen, Type and screen, ABO/Rh, BPAM RBC, DISCONTINUED: 0.9 %  sodium chloride infusion, DISCONTINUED: heparin lock flush 100 unit/mL, DISCONTINUED: acetaminophen (TYLENOL) tablet 650 mg, DISCONTINUED: diphenhydrAMINE (BENADRYL) capsule 25 mg, CANCELED: Practitioner attestation of consent, CANCELED: Complete patient signature process for consent form, CANCELED: Care order/instruction, CANCELED: Prepare RBC, CANCELED: Transfuse RBC  Port catheter in place - Plan: heparin lock flush 100 unit/mL, sodium chloride flush (NS) 0.9 % injection 10 mL  Breast carcinoma metastatic to multiple sites, right (HCC) - Plan: heparin lock flush 100 unit/mL, sodium chloride flush (NS) 0.9 % injection 10 mL  Malignant neoplasm of upper-inner quadrant of right female breast, unspecified estrogen receptor status ([place]) - Plan: heparin lock flush 100 unit/mL, sodium chloride flush (NS) 0.9 % injection 10 mL  Sciatica of left side - Plan: gabapentin (NEURONTIN) 300 MG capsule, traMADol (ULTRAM) 50 MG tablet  Pulmonary embolism without acute cor pulmonale, unspecified chronicity, unspecified pulmonary embolism type (HCC)   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 [name] and is currently treated with Halaven.  She has asked to have her chemotherapy delayed this week.  She will return in 7 days for consideration of her next dose of chemotherapy.  She has  been given a prescription for Duragesic 25 mcg patches for her pain possibly from bony metastatic disease.  Anemia due to antineoplastic chemotherapy: Patient CBC returned today with a hemoglobin of 7.9 and a hematocrit of 26.2.  She has been referred for a transfusion of 2 units of packed red blood cells.  Sciatica on the left side: The patient's exam shows a positive straight leg raise today.  I would have preferred to be able to place the patient on a steroid taper however given her diabetes this will be foregone.  She was told to change her dosing of Neurontin to 300 mg p.o. 3 times daily.  Additionally she has been given a prescription for tramadol.  The patient was given morphine sulfate 1 mg IV x2 today.  Pulmonary emboli: The patient will continue on Xarelto as prescribed while an inpatient recently.  Please see After Visit Summary for patient specific instructions.  Future Appointments  Date Time Provider [place]  10/01/2017  8:30 AM [facility] LAB 2 [facility] None  10/01/2017  8:45 AM [facility] J32 DNS [facility] None  10/01/2017  9:15 AM [name], MD [facility] None  10/01/2017 10:15 AM [facility] A1 [facility] None  10/01/2017 11:15 AM [name], RD [facility] None  10/22/2017 10:45 AM [facility] LAB 4 [facility] None  10/22/2017 11:00 AM [facility] FLUSH NURSE 2 [facility] None  10/22/2017 11:30 AM [name], MD [facility] None  10/22/2017 12:30 PM [facility] D12 [facility] None    Orders Placed This Encounter  Procedures  . Type and screen  . ABO/Rh       Subjective:   Patient ID:  Debbie Martinez is a 42 y.o. (DOB [birth year]) female.  Chief Complaint:  Chief Complaint  Patient presents  with  . Leg Pain    HPI Debbie Martinez is a 42 year old female with a diagnosis of a metastatic breast cancer with progressive disease despite multiple lines of therapy.  She is currently being treated with Halaven under the direction of Dr.  Lindi Adie she has recently been found to have a pulmonary emboli and is currently being treated with Xarelto.. She presents today with significant left posterior leg pain which extends down her leg.  She reports that she has not missed doses of Xarelto but is yet to take her dose today.  She is having neuropathy in her feet, nausea, and significant pain in her back which  is believed to be associated with her bony metastatic disease.  She denies changes in activity.   Medications: I have reviewed the patient's current medications.  Allergies:  Allergies  Allergen Reactions  . Lisinopril Swelling and Cough    Swelling under eyes and eyelids    . Tamoxifen Itching    Past Medical History:  Diagnosis Date  . Anxiety    gets sweaty and short of breath  . Breast cancer ([place]) 01/20/14   triple positive  . Diabetes   . GERD (gastroesophageal reflux disease)    does not take anything  . History of radiation therapy 11/03/14- 12/14/14   Right Breast, supraclavicular nodes, axillary nodes, and internal mammary nodes.  . History of radiation therapy 01/04/16- 01/23/16   Spine metastasis L2-S2  . Hypertension   . Liver metastases ([place])   . Metastatic cancer to bone ([place])   . Shortness of breath    'anxiety'    Past Surgical History:  Procedure Laterality Date  . BREAST LUMPECTOMY WITH NEEDLE LOCALIZATION AND AXILLARY SENTINEL LYMPH NODE BX Right 08/23/2014   Procedure: RIGHT BREAST LUMPECTOMY WITH NEEDLE LOCALIZATION(X'S 2)AND RIGHT AXILLARY SENTINEL LYMPH NODE Bi[facility]ies;  Surgeon: Autumn Messing III, MD;  Location: [place];  Service: General;  Laterality: Right;  . CESAREAN SECTION     2008  . KELOID EXCISION Left    ear  . PORTACATH PLACEMENT Left 01/26/2014   Procedure: INSERTION PORT-A-CATH;  Surgeon: Autumn Messing III, MD;  Location: [place];  Service: General;  Laterality: Left;  . RE-EXCISION OF BREAST CANCER,SUPERIOR MARGINS Right 09/15/2014   Procedure: RE-EXCISION OF RIGHT BREAST CANCER,SUPERIOR AND  INFERIOR MARGINS;  Surgeon: Autumn Messing III, MD;  Location: [place];  Service: General;  Laterality: Right;    Family History  Problem Relation Age of Onset  . Heart attack Father 51  . Lupus Maternal Aunt   . Prostate cancer Maternal Grandfather 85  . Dementia Paternal Grandmother   . Leukemia Cousin 35       paternal first cousin    Social History   Socioeconomic History  . Marital status: Single    Spouse name: Not on file  . Number of children: Not on file  . Years of education: Not on file  . Highest education level: Not on file  Occupational History  . Not on file  Social Needs  . Financial resource strain: Not on file  . Food insecurity:    Worry: Not on file    Inability: Not on file  . Transportation needs:    Medical: Not on file    Non-medical: Not on file  Tobacco Use  . Smoking status: Never Smoker  . Smokeless tobacco: Never Used  Substance and Sexual Activity  . Alcohol use: Yes    Comment: Rarely- maybe  every  6 months  . Drug use: No  . Sexual activity: Never  Lifestyle  . Physical activity:    Days per week: Not on file    Minutes per session: Not on file  . Stress: Not on file  Relationships  . Social connections:    Talks on phone: Not on file    Gets together: Not on file    Attends religious service: Not on file    Active member of club or organization: Not on file    Attends meetings of clubs or organizations: Not on file    Relationship status: Not on file  . Intimate partner violence:    Fear of current or ex partner: Not on file    Emotionally abused: Not on file    Physically abused: Not on file    Forced sexual activity: Not on file  Other Topics Concern  . Not on file  Social History Narrative  . Not on file    Past Medical History, Surgical history, Social history, and Family history were reviewed and updated as appropriate.   Please see review of systems for further details on the patient's review from  today.   Review of Systems:  Review of Systems  Constitutional: Positive for fatigue. Negative for activity change, chills, diaphoresis and fever.  HENT: Negative for mouth sores and trouble swallowing.   Respiratory: Negative for cough, chest tightness, shortness of breath and wheezing.   Cardiovascular: Negative for chest pain, palpitations and leg swelling.  Gastrointestinal: Positive for nausea. Negative for constipation, diarrhea and vomiting.  Genitourinary: Negative for difficulty urinating.  Musculoskeletal: Positive for arthralgias, back pain, gait problem (Gait problems due to pain.) and myalgias.       Left hip pain with extension of pain down the left leg.  Neurological: Positive for numbness (Numbness and tingling in her feet). Negative for dizziness, light-headedness and headaches.    Objective:   Physical Exam:  BP 123/85 (BP Location: Left Arm, Patient Position: Sitting)   Pulse (!) 132   Temp 99.1 F (37.3 C) (Oral)   Resp 18   SpO2 96%  ECOG: 1  Physical Exam  Constitutional:  The patient is an adult female who appears to be in a moderate amount of discomfort.  HENT:  Head: Normocephalic and atraumatic.  Eyes: Right eye exhibits no discharge. Left eye exhibits no discharge. No scleral icterus.  Cardiovascular: Regular rhythm, S1 normal and S2 normal. Tachycardia present.  Pulmonary/Chest: Effort normal and breath sounds normal. No respiratory distress. She has no wheezes.  Abdominal: Soft. Bowel sounds are normal. She exhibits no distension and no mass. There is no tenderness. There is no rebound and no guarding.  Musculoskeletal: She exhibits no edema or deformity.  Neurological: She is alert.  Positive left straight leg raise.  Skin: She is not diaphoretic.  Psychiatric: She has a normal mood and affect. Her behavior is normal. Judgment and thought content normal.    Lab Review:     Component Value Date/Time   NA 135 (L) 09/10/2017 1013   NA 139  04/09/2017 0950   K 4.1 09/10/2017 1013   K 3.3 (L) 04/09/2017 0950   CL 102 09/10/2017 1013   CO2 22 09/10/2017 1013   CO2 25 04/09/2017 0950   GLUCOSE 221 (H) 09/10/2017 1013   GLUCOSE 213 (H) 04/09/2017 0950   BUN 11 09/10/2017 1013   BUN 14.9 04/09/2017 0950   CREATININE 0.72 09/10/2017 1013   CREATININE 0.9 04/09/2017 0950  CALCIUM 9.2 09/10/2017 1013   CALCIUM 9.0 04/09/2017 0950   PROT 8.6 (H) 09/10/2017 1013   PROT 7.1 04/09/2017 0950   ALBUMIN 3.4 (L) 09/10/2017 1013   ALBUMIN 3.6 04/09/2017 0950   AST 29 09/10/2017 1013   AST 11 04/09/2017 0950   ALT 29 09/10/2017 1013   ALT 12 04/09/2017 0950   ALKPHOS 111 09/10/2017 1013   ALKPHOS 63 04/09/2017 0950   BILITOT 0.5 09/10/2017 1013   BILITOT 0.37 04/09/2017 0950   GFRNONAA >60 09/10/2017 1013   GFRAA >60 09/10/2017 1013       Component Value Date/Time   WBC 10.8 (H) 09/10/2017 1013   WBC 8.3 09/07/2017 0800   RBC 3.24 (L) 09/10/2017 1013   HGB 7.9 (L) 09/10/2017 1013   HGB 9.4 (L) 04/09/2017 0950   HCT 26.2 (L) 09/10/2017 1013   HCT 29.1 (L) 04/09/2017 0950   PLT 338 09/10/2017 1013   PLT 291 04/09/2017 0950   MCV 80.9 09/10/2017 1013   MCV 94.0 04/09/2017 0950   MCH 24.4 (L) 09/10/2017 1013   MCHC 30.2 (L) 09/10/2017 1013   RDW 19.8 (H) 09/10/2017 1013   RDW 15.4 (H) 04/09/2017 0950   LYMPHSABS 1.1 09/10/2017 1013   LYMPHSABS 0.4 (L) 04/09/2017 0950   MONOABS 0.9 09/10/2017 1013   MONOABS 0.1 04/09/2017 0950   EOSABS 0.1 09/10/2017 1013   EOSABS 0.0 04/09/2017 0950   BASOSABS 0.0 09/10/2017 1013   BASOSABS 0.0 04/09/2017 0950   -------------------------------  Imaging from last 24 hours (if applicable):  Radiology interpretation: Dg Chest 2 View  Result Date: 09/04/2017 CLINICAL DATA:  Chest pain, weakness, fever EXAM: CHEST - 2 VIEW COMPARISON:  Chest CT 09/02/2017 FINDINGS: Left Port-A-Cath in place with the tip in the SVC. Heart is normal size. No confluent airspace opacities or effusions.  No acute bony abnormality. IMPRESSION: No active cardiopulmonary disease. Electronically Signed   By: Rolm Baptise M.D.   On: 09/04/2017 11:09   Ct Chest W Contrast  Result Date: 09/02/2017 CLINICAL DATA:  Patient with history of recurrent breast cancer. On chemotherapy. Follow-up evaluation. EXAM: CT CHEST, ABDOMEN, AND PELVIS WITH CONTRAST TECHNIQUE: Multidetector CT imaging of the chest, abdomen and pelvis was performed following the standard protocol during bolus administration of intravenous contrast. CONTRAST:  146mL OMNIPAQUE IOHEXOL 300 MG/ML  SOLN COMPARISON:  CT CAP 06/06/2017 FINDINGS: CT CHEST FINDINGS Cardiovascular: Left anterior chest wall Port-A-Cath is present with tip terminating in the superior vena cava. Normal heart size. Trace fluid superior pericardial recess. Mediastinum/Nodes: Postsurgical changes right axilla. No left axillary adenopathy. Interval development of mediastinal lymph nodes including an 11 mm subcarinal node (image 22; series 2), previously 5 mm. New 7 mm prevascular lymph node (image 21; series 2), previously 3 mm. Lungs/Pleura: Central airways are patent. Dependent atelectasis within the bilateral lower lobes. New 2 mm nodule along left fissure (image 50; series 6). New 7 mm right upper lobe nodule (image 67; series 6). Re-demonstrated subpleural consolidation peripheral right middle lobe. No pneumothorax. Musculoskeletal: Unchanged postsurgical changes medial right breast. Interval increase in sclerotic lesions throughout the visualized skeleton including the thoracic spine and proximal left humerus. Reference 8 mm sclerotic lesion proximal left humerus (image 5; series 6). Increase in sclerosis within the T8 vertebral body (image 79; series 5). CT ABDOMEN PELVIS FINDINGS Hepatobiliary: Interval increase in size of multiple low-attenuation lesions throughout the liver. Reference lesion within the medial left hepatic lobe measures 2.8 x 3.2 cm (image 46; series 2),  previously 1.4 x 1.5 cm. Reference lesion within the left hepatic lobe measures 1.9 x 1.9 cm (image 53; series 2), previously 0.9 x 0.9 cm. Gallbladder is unremarkable. No intrahepatic or extrahepatic biliary ductal dilatation. Pancreas: Unremarkable Spleen: Unremarkable Adrenals/Urinary Tract: Normal adrenal glands. Kidneys enhance symmetrically with contrast. Unchanged 1.1 cm low-attenuation lesion superior pole left kidney. No hydronephrosis. Urinary bladder is unremarkable. Stomach/Bowel: No abnormal bowel wall thickening or evidence for bowel obstruction. Normal morphology of the stomach. No free fluid or free intraperitoneal air. Vascular/Lymphatic: Normal caliber abdominal aorta. No retroperitoneal lymphadenopathy. Slight interval increase in size of porta hepatic lymph node measuring 1.0 cm, previously 0.9 cm (image 54; series 2). Reproductive: Uterus and adnexal structures are unremarkable. Other: None. Musculoskeletal: Re- demonstrated L4 compression fracture. Overall similar lucent and sclerotic lesions throughout the lumbar spine and pelvis. IMPRESSION: 1. Interval increase in size and number of multiple low-attenuation lesions throughout the liver compatible with hepatic metastatic disease. 2. Interval development of multiple prominent and mildly enlarged mediastinal lymph nodes, new from prior, most compatible with nodal metastatic disease. 3. Interval development of pulmonary nodules concerning for metastatic disease. 4. Interval increase in sclerotic lesions involving the visualized humerus and upper thoracic spine. Additional lucent and sclerotic lesions throughout the skeleton are similar when compared to prior exam. Electronically Signed   By: Lovey Newcomer M.D.   On: 09/02/2017 13:42   Ct Angio Chest Pe W And/or Wo Contrast  Result Date: 09/04/2017 CLINICAL DATA:  Chest pain.  History of breast carcinoma EXAM: CT ANGIOGRAPHY CHEST WITH CONTRAST TECHNIQUE: Multidetector CT imaging of the chest  was performed using the standard protocol during bolus administration of intravenous contrast. Multiplanar CT image reconstructions and MIPs were obtained to evaluate the vascular anatomy. CONTRAST:  83mL ISOVUE-370 IOPAMIDOL (ISOVUE-370) INJECTION 76% COMPARISON:  Chest CT Sep 02, 2017 and chest radiograph Sep 04, 2017 FINDINGS: Cardiovascular: There are pulmonary emboli in several subsegmental branches of the right lower lobe pulmonary artery. No more central pulmonary embolus evident. No evidence of right heart strain. No thoracic aortic aneurysm or dissection. The visualized great vessels appear unremarkable. No pericardial effusion or pericardial thickening is evident. Port-A-Cath tip is in the superior vena cava near the cavoatrial junction. Mediastinum/Nodes: Visualized thyroid appears normal. There are scattered subcentimeter mediastinal lymph nodes. There is a subcarinal lymph node measuring 1.7 x 1.1 cm. No other lymph node enlargement noted. No change in mediastinal lymph nodes compared to 2 days prior. No esophageal lesions are evident. Lungs/Pleura: There is subpleural consolidation in the periphery of the right middle lobe, stable. There is a focal nodular lesion in the medial segment right middle lobe measuring 7 x 7 mm, stable. This nodular opacity is best seen on axial slice 57 series 7 and is stable compared to 2 days prior. No new areas of airspace consolidation are identified. No pleural effusion or pleural thickening is evident. There is again noted a 2 mm nodular opacity in the periphery of the posterior segment of the left upper lobe near the major fissure. Upper Abdomen: Widespread hepatic metastatic disease again noted. Musculoskeletal: Widespread sclerotic bony metastases again noted. Postoperative change noted in right breast, stable. Review of the MIP images confirms the above findings. IMPRESSION: 1. Small peripheral pulmonary emboli in the right lower lobe pulmonary artery. No more  central pulmonary emboli. No right heart strain evident. 2.  Widespread hepatic and bony metastatic disease. 3. Stable consolidation periphery of right middle lobe, unchanged from 2 days prior. Subcentimeter nodular opacities  as well as mild adenopathy unchanged from 2 days prior. 4.  Stable postoperative change right breast. Critical Value/emergent results were called by telephone at the time of interpretation on 09/04/2017 at 12:46 pm to Dr. Marda Stalker , who verbally acknowledged these results. Electronically Signed   By: Lowella Grip III M.D.   On: 09/04/2017 12:48   Mr Jeri Cos DH Contrast  Result Date: 09/04/2017 CLINICAL DATA:  42 year old female with metastatic breast cancer. New onset weakness. Small peripheral pulmonary emboli diagnosed on CTA today. EXAM: MRI HEAD WITHOUT AND WITH CONTRAST TECHNIQUE: Multiplanar, multiecho pulse sequences of the brain and surrounding structures were obtained without and with intravenous contrast. CONTRAST:  57mL MULTIHANCE GADOBENATE DIMEGLUMINE 529 MG/ML IV SOLN COMPARISON:  Chest CTA 1222 hours today. Nuclear medicine whole-body bone scan 06/06/2017. FINDINGS: Brain: No abnormal enhancement identified. No midline shift, mass effect, or evidence of intracranial mass lesion. No dural thickening. Vascular: No restricted diffusion to suggest acute infarction. No ventriculomegaly, extra-axial collection or acute intracranial hemorrhage. Cervicomedullary junction and pituitary are within normal limits. Pearline Cables and white matter signal is within normal limits throughout the brain. No chronic cerebral blood products or encephalomalacia. Skull and upper cervical spine: Sclerotic bone metastasis in the C3 vertebral body (series 4, image 12). Heterogeneous bone marrow signal in the calvarium suspicious for scattered skull metastases (including possibly the clivus), but no destructive skull lesion is identified. Negative visible cervical spinal cord. Sinuses/Orbits:  Mildly Disconjugate gaze but otherwise normal orbit soft tissues. Paranasal sinuses are clear. Other: Mastoid air cells are clear. Visible internal auditory structures appear normal. Scalp and face soft tissues appear negative. IMPRESSION: 1. No acute intracranial abnormality. No metastatic disease to the brain identified. 2. C3 vertebral body bone metastasis. Bone marrow heterogeneity elsewhere in the skull and cervical spine but no other overt bone lesion identified. Electronically Signed   By: Genevie Ann M.D.   On: 09/04/2017 17:11   Ct Abdomen Pelvis W Contrast  Result Date: 09/02/2017 CLINICAL DATA:  Patient with history of recurrent breast cancer. On chemotherapy. Follow-up evaluation. EXAM: CT CHEST, ABDOMEN, AND PELVIS WITH CONTRAST TECHNIQUE: Multidetector CT imaging of the chest, abdomen and pelvis was performed following the standard protocol during bolus administration of intravenous contrast. CONTRAST:  175mL OMNIPAQUE IOHEXOL 300 MG/ML  SOLN COMPARISON:  CT CAP 06/06/2017 FINDINGS: CT CHEST FINDINGS Cardiovascular: Left anterior chest wall Port-A-Cath is present with tip terminating in the superior vena cava. Normal heart size. Trace fluid superior pericardial recess. Mediastinum/Nodes: Postsurgical changes right axilla. No left axillary adenopathy. Interval development of mediastinal lymph nodes including an 11 mm subcarinal node (image 22; series 2), previously 5 mm. New 7 mm prevascular lymph node (image 21; series 2), previously 3 mm. Lungs/Pleura: Central airways are patent. Dependent atelectasis within the bilateral lower lobes. New 2 mm nodule along left fissure (image 50; series 6). New 7 mm right upper lobe nodule (image 67; series 6). Re-demonstrated subpleural consolidation peripheral right middle lobe. No pneumothorax. Musculoskeletal: Unchanged postsurgical changes medial right breast. Interval increase in sclerotic lesions throughout the visualized skeleton including the thoracic spine  and proximal left humerus. Reference 8 mm sclerotic lesion proximal left humerus (image 5; series 6). Increase in sclerosis within the T8 vertebral body (image 79; series 5). CT ABDOMEN PELVIS FINDINGS Hepatobiliary: Interval increase in size of multiple low-attenuation lesions throughout the liver. Reference lesion within the medial left hepatic lobe measures 2.8 x 3.2 cm (image 46; series 2), previously 1.4 x 1.5 cm. Reference lesion  within the left hepatic lobe measures 1.9 x 1.9 cm (image 53; series 2), previously 0.9 x 0.9 cm. Gallbladder is unremarkable. No intrahepatic or extrahepatic biliary ductal dilatation. Pancreas: Unremarkable Spleen: Unremarkable Adrenals/Urinary Tract: Normal adrenal glands. Kidneys enhance symmetrically with contrast. Unchanged 1.1 cm low-attenuation lesion superior pole left kidney. No hydronephrosis. Urinary bladder is unremarkable. Stomach/Bowel: No abnormal bowel wall thickening or evidence for bowel obstruction. Normal morphology of the stomach. No free fluid or free intraperitoneal air. Vascular/Lymphatic: Normal caliber abdominal aorta. No retroperitoneal lymphadenopathy. Slight interval increase in size of porta hepatic lymph node measuring 1.0 cm, previously 0.9 cm (image 54; series 2). Reproductive: Uterus and adnexal structures are unremarkable. Other: None. Musculoskeletal: Re- demonstrated L4 compression fracture. Overall similar lucent and sclerotic lesions throughout the lumbar spine and pelvis. IMPRESSION: 1. Interval increase in size and number of multiple low-attenuation lesions throughout the liver compatible with hepatic metastatic disease. 2. Interval development of multiple prominent and mildly enlarged mediastinal lymph nodes, new from prior, most compatible with nodal metastatic disease. 3. Interval development of pulmonary nodules concerning for metastatic disease. 4. Interval increase in sclerotic lesions involving the visualized humerus and upper thoracic  spine. Additional lucent and sclerotic lesions throughout the skeleton are similar when compared to prior exam. Electronically Signed   By: Lovey Newcomer M.D.   On: 09/02/2017 13:42        This case was discussed with Dr. Lindi Adie. He expresses agreement with my management of this patient.

## 2017-09-13 ENCOUNTER — Telehealth: Payer: Self-pay

## 2017-09-13 NOTE — Progress Notes (Signed)
I will get her scheduled.

## 2017-09-13 NOTE — Telephone Encounter (Signed)
Appt reminder:  VM left for pt to let her know about 5/28 appt with Dr Lindi Adie at 10:45 am.

## 2017-09-17 ENCOUNTER — Ambulatory Visit (HOSPITAL_COMMUNITY)
Admission: RE | Admit: 2017-09-17 | Discharge: 2017-09-17 | Disposition: A | Discharge status: Home / Self Care | Payer: Medicare Other | Source: Ambulatory Visit | Attending: Hematology and Oncology | Admitting: Hematology and Oncology

## 2017-09-17 ENCOUNTER — Encounter: Payer: Self-pay | Admitting: *Deleted

## 2017-09-17 ENCOUNTER — Other Ambulatory Visit: Payer: Self-pay | Admitting: *Deleted

## 2017-09-17 ENCOUNTER — Inpatient Hospital Stay (HOSPITAL_BASED_OUTPATIENT_CLINIC_OR_DEPARTMENT_OTHER): Payer: Medicare Other | Admitting: Hematology and Oncology

## 2017-09-17 ENCOUNTER — Ambulatory Visit (HOSPITAL_COMMUNITY): Payer: Medicare Other

## 2017-09-17 DIAGNOSIS — M25552 Pain in left hip: Secondary | ICD-10-CM

## 2017-09-17 DIAGNOSIS — C50211 Malignant neoplasm of upper-inner quadrant of right female breast: Secondary | ICD-10-CM

## 2017-09-17 DIAGNOSIS — C7951 Secondary malignant neoplasm of bone: Secondary | ICD-10-CM

## 2017-09-17 DIAGNOSIS — M5432 Sciatica, left side: Secondary | ICD-10-CM | POA: Diagnosis not present

## 2017-09-17 DIAGNOSIS — C773 Secondary and unspecified malignant neoplasm of axilla and upper limb lymph nodes: Secondary | ICD-10-CM | POA: Diagnosis not present

## 2017-09-17 DIAGNOSIS — Z170 Estrogen receptor positive status [ER+]: Secondary | ICD-10-CM | POA: Diagnosis not present

## 2017-09-17 DIAGNOSIS — Z7901 Long term (current) use of anticoagulants: Secondary | ICD-10-CM | POA: Diagnosis not present

## 2017-09-17 DIAGNOSIS — E119 Type 2 diabetes mellitus without complications: Secondary | ICD-10-CM | POA: Diagnosis not present

## 2017-09-17 DIAGNOSIS — B029 Zoster without complications: Secondary | ICD-10-CM | POA: Diagnosis not present

## 2017-09-17 DIAGNOSIS — I2699 Other pulmonary embolism without acute cor pulmonale: Secondary | ICD-10-CM | POA: Diagnosis not present

## 2017-09-17 DIAGNOSIS — C787 Secondary malignant neoplasm of liver and intrahepatic bile duct: Secondary | ICD-10-CM | POA: Diagnosis not present

## 2017-09-17 DIAGNOSIS — C50919 Malignant neoplasm of unspecified site of unspecified female breast: Secondary | ICD-10-CM | POA: Diagnosis not present

## 2017-09-17 DIAGNOSIS — M8448XA Pathological fracture, other site, initial encounter for fracture: Secondary | ICD-10-CM | POA: Insufficient documentation

## 2017-09-17 DIAGNOSIS — M545 Low back pain: Secondary | ICD-10-CM | POA: Diagnosis not present

## 2017-09-17 DIAGNOSIS — D6481 Anemia due to antineoplastic chemotherapy: Secondary | ICD-10-CM | POA: Diagnosis not present

## 2017-09-17 DIAGNOSIS — M2550 Pain in unspecified joint: Secondary | ICD-10-CM | POA: Diagnosis not present

## 2017-09-17 MED ORDER — FLUCONAZOLE 100 MG PO TABS: 100.0000 mg | ORAL_TABLET | Freq: Every day | ORAL | 0 refills | Status: DC

## 2017-09-17 MED FILL — FLUCONAZOLE 100 MG TABLET: 100 | 7 days supply | Qty: 7 | Fill #0

## 2017-09-17 NOTE — Progress Notes (Signed)
Patient Care Team: Lucianne Lei, MD as PCP - General (Family Medicine) [name], MD as Consulting Physician (General Surgery) [name], MD as Consulting Physician (Hematology and Oncology) [name], MD as Attending Physician (Radiation Oncology) Benson Norway, RN as Registered Nurse (Oncology) Sylvan Cheese, NP as Nurse Practitioner (Hematology and Oncology)  DIAGNOSIS:  Encounter Diagnosis  Name Primary?  . Malignant neoplasm of upper-inner quadrant of right breast in female, estrogen receptor positive ([place])     SUMMARY OF ONCOLOGIC HISTORY:   Breast cancer of upper-inner quadrant of right female breast ([place])   01/12/2014 Mammogram    Right breast - two lesions: #1 2:00 position 1.6 x 1.9 cm and #2 12:00 position 0.5 x 0.9 cm. Distance between the 2 was 4.5 cm, right axillary lymph node enlargement      01/12/2014 Initial Biopsy    Right breast 3 biopsies: All were IDC with DCIS ER+ PR + Ki-67 85% HER-2 negative: Right axillary lymph node also positive for metastatic cancer ER positive HER-2 negative Ki-67 80% grade 2      01/18/2014 Breast MRI    Right breast 12:00 position 2.8 x 2 x 2.2 cm: 2:00 position 1.3 x 0.8 x 0.5 cm, right axilla multiple enlarged lymph nodes 1 cm in size, right retropectoral lymph node 0.6 cm      01/18/2014 Clinical Stage    Stage IIB T2 N1      01/25/2014 Procedure    Breast/Ovarian ([facility]) reveals no clinically significant variant at ATM, BARD1, BRCA1, BRCA2, BRIP1, CDH1, CHEK2, EPCAM, FANCC, MLH1, MSH2, MSH6, NBN, PALB2, PMS2, PTEN, RAD51C, RAD51D, TP53, and XRCC2.       02/11/2014 - 07/01/2014 Neo-Adjuvant Chemotherapy    Doxorubicin and cyclophosphamide X 4 followed by Taxol weekly x12       07/12/2014 Breast MRI    Partial response to neoadjuvant chemotherapy right breast mass decreased from 2.8 cm to 2.3 cm, 2 small masses along the medial margin also decreased; significant decrease right breast mass 11 to 12:00  and 13 mm to 8 mm, decrease in right axilla LN      08/23/2014 Definitive Surgery    Right lumpectomy/SLNB [name]) 2:00: IDC grade 3, 3.2 cm, high-grade DCIS, LV I present, PNI present,; 12:00: IDC grade 30.8 cm, high-grade DCIS, LV I, 4/4 lymph nodes positive with ECE, ER 90%, PR 40%, HER-2 negative margin positive      08/23/2014 Pathologic Stage    Stage IIIA: ypT2 ypN2a      09/15/2014 Surgery    Reexcision of margins: clear; no evidence of malignancy      11/03/2014 - 12/14/2014 Radiation Therapy    Adjuvant XRT Isidore Moos): Right Breast, supraclavicular nodes, axillary nodes, and internal mammary nodes: 50 Gy over 25 fractions; right breast boost: 10 Gy over 5 fractions. Total dose: 60 Gy      12/29/2014 -  Anti-estrogen oral therapy    Tamoxifen 20 mg daily      04/08/2015 Survivorship    Survivorship care plan completed and mailed to patient in lieu of in person visit at her request      12/31/2015 Relapse/Recurrence    Innumerable osseous metastases with acute pathologic fracture the L4 vertebral body. 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 L5-S1 protrusion with left more than right S1 impingement      01/05/2016 - 01/12/2016 Radiation Therapy    Palliative radiation to the spine for cord compression      01/13/2016 -  Anti-estrogen oral therapy    Ribociclib 3 weeks on and 1 week off and Letrozole (with Zolodex until oophorectomy) and Xgeva for bone mets      06/17/2017 Pathology Results    Liver biopsy: Metastatic carcinoma: positive for cytokeratin 7, GCDFP, and GATA-3 but negative for cytokeratin 20. The immunoprofile is consistent with metastasis from the patient's known breast carcinoma.  ER 0%, PR 0%, Ki-67 20%, HER-2 negative ratio 1.23      07/02/2017 - 08/20/2017 Chemotherapy    Halaven day 1 day 8 every 3 weeks      09/02/2017 Imaging    Progression of disease.development of mediastinal lymph nodes 1.1 cm subcarinal node previously 0.5 cm,  new 7 mm prevascular lymph node, small bilateral lung nodules 2 mm and 7 mm, increase in sclerotic lesions in the thoracic spine especially T8 and proximal left humerus;  Inc in liver mets 2.8 cm (was 1.5 cm), left hepatic lobe 1.9 cm was 0.9 cm, L4 compression fracture      09/17/2017 -  Chemotherapy    Carboplatin every 3 weeks        Breast carcinoma metastatic to multiple sites ([place])   12/31/2015 Initial Diagnosis    Breast carcinoma metastatic to multiple sites ([place])      09/03/2017 -  Chemotherapy    The patient had palonosetron (ALOXI) injection 0.25 mg, 0.25 mg, Intravenous,  Once, 0 of 6 cycles CARBOplatin (PARAPLATIN) 700 mg in sodium chloride 0.9 % 250 mL chemo infusion, 700 mg (100 % of original dose 700 mg), Intravenous,  Once, 0 of 6 cycles Dose modification: 700 mg (original dose 700 mg, Cycle 1) fosaprepitant (EMEND) 150 mg, dexamethasone (DECADRON) 12 mg in sodium chloride 0.9 % 145 mL IVPB, , Intravenous,  Once, 0 of 6 cycles  for chemotherapy treatment.        CHIEF COMPLIANT: Cycle 1 palliative chemotherapy with carboplatin  INTERVAL HISTORY: DAKIYA PUOPOLO is a 42 year old with above-mentioned history of metastatic breast cancer who is progressed on recent chemotherapy with Halaven.  She is here today to begin her first treatment with carboplatin.  She was admitted to the hospital recently with pulmonary embolism and was put on Xarelto.  She appears to be tolerating Xarelto extremely well.  Denies any bleeding symptoms. She is having difficulty with weightbearing on the left hip as well as pain in the back when she coughs.  She is requiring assistance with ADLs. Patient is complaining of thrush in the mouth.  REVIEW OF SYSTEMS:   Constitutional: Denies fevers, chills or abnormal weight loss Eyes: Denies blurriness of vision Ears, nose, mouth, throat, and face: Denies mucositis or sore throat Respiratory: Denies cough, dyspnea or wheezes Cardiovascular: Denies  palpitation, chest discomfort Gastrointestinal:  Denies nausea, heartburn or change in bowel habits Skin: Denies abnormal skin rashes Lymphatics: Denies new lymphadenopathy or easy bruising Neurological: Peripheral neuropathy Behavioral/Psych: Appears depressed Extremities: Difficulty with weightbearing on the left leg  All other systems were reviewed with the patient and are negative.  I have reviewed the past medical history, past surgical history, social history and family history with the patient and they are unchanged from previous note.  ALLERGIES:  is allergic to lisinopril and tamoxifen.  MEDICATIONS:  Current Outpatient Medications  Medication Sig Dispense Refill  . ACCU-CHEK AVIVA PLUS test strip USE AS DIRECTED 4 TIMES A DAY 1 each 12  . ACCU-CHEK SOFTCLIX LANCETS lancets 100 each by Other route 4 (four) times daily. Use as instructed 100 each  0  . acetaminophen (TYLENOL) 500 MG tablet Take 1,000 mg by mouth every 4 (four) hours as needed for moderate pain.    Marland Kitchen azithromycin (ZITHROMAX) 250 MG tablet Take 1 tablet (250 mg total) by mouth daily. 3 each 0  . blood glucose meter kit and supplies KIT Dispense based on patient and insurance preference. Use up to four times daily as directed. (FOR ICD-9 250.00, 250.01). 1 each 0  . Cholecalciferol (VITAMIN D-3 PO) Take 1 capsule by mouth daily.    Marland Kitchen ENSURE (ENSURE) Take 237 mLs by mouth 2 (two) times daily between meals.     . fentaNYL (DURAGESIC - DOSED MCG/HR) 25 MCG/HR patch Place 1 patch (25 mcg total) onto the skin every 3 (three) days. 10 patch 0  . fluconazole (DIFLUCAN) 100 MG tablet Take 1 tablet (100 mg total) by mouth daily. 7 tablet 0  . fluticasone (FLONASE) 50 MCG/ACT nasal spray Place 2 sprays into both nostrils 2 (two) times daily as needed for allergies.  2  . Fluticasone Furoate-Vilanterol (BREO ELLIPTA IN) Inhale 1 puff into the lungs daily as needed (Shortness of breath).    . gabapentin (NEURONTIN) 300 MG capsule  Take 1 capsule (300 mg total) by mouth 3 (three) times daily. 90 capsule 5  . IRON PO Take 1 tablet by mouth 2 (two) times daily.    . Iron-Vitamins (GERITOL) LIQD Take 5 mLs by mouth daily.    Marland Kitchen JANUMET 50-500 MG tablet Take 1 tablet by mouth daily.  0  . lidocaine-prilocaine (EMLA) cream Apply to affected area once 30 g 3  . Lidocaine-Prilocaine, Bulk, 2.5-2.5 % CREA Apply 5 mLs topically as needed. 2 hours before use, cover with plawtic wrap. 30 g 2  . LORazepam (ATIVAN) 0.5 MG tablet Take 1 tablet (0.5 mg total) by mouth at bedtime as needed (Nausea or vomiting). 30 tablet 0  . omeprazole (PRILOSEC) 20 MG capsule Take 1 capsule (20 mg total) by mouth daily. Take while using dexamethasone to prevent heartburn. (Patient taking differently: Take 20 mg by mouth daily as needed (Heartburn). ) 30 capsule 2  . ondansetron (ZOFRAN) 8 MG tablet Take 1 tablet (8 mg total) by mouth 2 (two) times daily as needed for refractory nausea / vomiting. Start on day 3 after chemo. 30 tablet 1  . oxyCODONE-acetaminophen (PERCOCET/ROXICET) 5-325 MG tablet Take 1 tablet by mouth every 6 (six) hours as needed for severe pain. (Patient taking differently: Take 1-2 tablets by mouth every 6 (six) hours as needed for severe pain. ) 60 tablet 0  . polyethylene glycol (MIRALAX / GLYCOLAX) packet Take 17 g by mouth daily. (Patient taking differently: Take 17 g by mouth daily as needed for mild constipation. ) 14 each 0  . prochlorperazine (COMPAZINE) 10 MG tablet Take 1 tablet (10 mg total) by mouth every 6 (six) hours as needed (Nausea or vomiting). 30 tablet 1  . Rivaroxaban (XARELTO) 15 MG TABS tablet Take 1 tablet (15 mg total) by mouth 2 (two) times daily with a meal. 37 tablet 0  . [START ON 09/26/2017] rivaroxaban (XARELTO) 20 MG TABS tablet Take 1 tablet (20 mg total) by mouth daily with supper. 30 tablet 0  . traMADol (ULTRAM) 50 MG tablet Take 1 tablet (50 mg total) by mouth every 6 (six) hours as needed. 45 tablet 0  .  valsartan (DIOVAN) 80 MG tablet Take 1 tablet (80 mg total) by mouth daily. 30 tablet 0   No current facility-administered medications for  this visit.     PHYSICAL EXAMINATION: ECOG PERFORMANCE STATUS: 1 - Symptomatic but completely ambulatory  Vitals:   09/17/17 1111  BP: 117/84  Pulse: (!) 133  Resp: 18  Temp: 99 F (37.2 C)  SpO2: 100%   Filed Weights   09/17/17 1111  Weight: 161 lb 14.4 oz (73.4 kg)    GENERAL:alert, no distress and comfortable SKIN: skin color, texture, turgor are normal, no rashes or significant lesions EYES: normal, Conjunctiva are pink and non-injected, sclera clear OROPHARYNX:no exudate, no erythema and lips, buccal mucosa, and tongue normal  NECK: supple, thyroid normal size, non-tender, without nodularity LYMPH:  no palpable lymphadenopathy in the cervical, axillary or inguinal LUNGS: clear to auscultation and percussion with normal breathing effort HEART: regular rate & rhythm and no murmurs and no lower extremity edema ABDOMEN:abdomen soft, non-tender and normal bowel sounds MUSCULOSKELETAL:no cyanosis of digits and no clubbing  NEURO: Peripheral neuropathy, left leg weakness and pain with weightbearing EXTREMITIES: No lower extremity edema   LABORATORY DATA:  I have reviewed the data as listed CMP Latest Ref Rng & Units 09/10/2017 09/05/2017 09/04/2017  Glucose 70 - 140 mg/dL 221(H) 181(H) 134(H)  BUN 7 - 26 mg/dL _0 Creatinine 0.60 - 1.10 mg/dL 0.72 0.49 0.45  Sodium 136 - 145 mmol/L 135(L) 137 138  Potassium 3.5 - 5.1 mmol/L 4.1 4.0 3.9  Chloride 98 - 109 mmol/L 102 104 104  CO2 22 - 29 mmol/L _1 Calcium 8.4 - 10.4 mg/dL 9.2 8.0(L) 8.8(L)  Total Protein 6.4 - 8.3 g/dL 8.6(H) - 7.8  Total Bilirubin 0.2 - 1.2 mg/dL 0.5 - 0.5  Alkaline Phos 40 - 150 U/L 111 - 89  AST 5 - 34 U/L 29 - 29  ALT 0 - 55 U/L 29 - 26    Lab Results  Component Value Date   WBC 10.8 (H) 09/10/2017   HGB 7.9 (L) 09/10/2017   HCT 26.2 (L) 09/10/2017    MCV 80.9 09/10/2017   PLT 338 09/10/2017   NEUTROABS 8.7 (H) 09/10/2017    ASSESSMENT & PLAN:  Breast cancer of upper-inner quadrant of right female breast ([place]) 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   Treatment summary:  1. Patient completed neoadjuvant dose dense Adriamycin and Cytoxan and weekly Taxol X 12 started 02/09/2014 and completed 07/01/14 2. Right double lumpectomy 2:00: IDC grade 3, 3.2 cm, high-grade DCIS, LV I present, PNI present,; 12:00: IDC grade 3; 0.8 cm, high-grade DCIS, LVI, 4/4 lymph nodes positive with ECE, ER 90%, PR 40%, HER-2 negative margin positive T2 N2 M0 stage IIIa 3. Patient is on [facility] clinical trial and she was randomized intraoperatively for NO lymph node dissection. 4.Tamoxifen 20 mg daily started 12/29/2014 stopped July 2016 for adverse effects 5. 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 Palliative XRT to the spine for cord compression 8.Ribociclibwith letrozole plus ovarian suppression started 01/13/2016-06/25/2017 9.  Halaven 07/02/2017-08/20/2017 --------------------------------------------------------------------------------------------------------------------------------------------------------- Liver biopsy 2/25/2019Metastatic carcinoma: positive for cytokeratin 7, GCDFP, and GATA-3 but negative for cytokeratin 20. The immunoprofile is consistent with metastasis from the patient's known breast carcinoma. ER 0%, PR 0%, Ki-67 20%, HER-2 negative ratio 1.23  Current treatmentplan: Carboplatin every 3 weeks plan to start next week. I requested PDL 1 testing  Bone Mets: Xgeva with calcium and vitamin D Pulmonary  embolism May 2019: Currently on Xarelto Left hip and left back pain: X-rays  will be obtained today.  She was prescribed fentanyl and Percocet.  PT appears to be controlling her pain.  Her pain is 3 out of 10 today.. Prognosis: I briefly discussed with her that her performance status is currently declining.  If she continues to decline in she is unable to come for these infusions then we may have to talk about hospice care.  She understands that the goal of treatment is purely palliation.  Return to clinic in 1 week for starting chemotherapy.     Orders Placed This Encounter  Procedures  . DG Lumbar Spine 2-3 Views    Standing Status:   Future    Number of Occurrences:   1    Standing Expiration Date:   09/17/2018    Order Specific Question:   Reason for Exam (SYMPTOM  OR DIAGNOSIS REQUIRED)    Answer:   Met breast cancer with back pain with cough    Order Specific Question:   Is patient pregnant?    Answer:   No    Order Specific Question:   Preferred imaging location?    Answer:   [facility]    Order Specific Question:   Radiology Contrast Protocol - do NOT remove file path    Answer:   [email or web address]   The patient has a good understanding of the overall plan. she agrees with it. she will call with any problems that may develop before the next visit here.   Harriette Ohara, MD 09/17/17

## 2017-09-17 NOTE — Assessment & Plan Note (Signed)
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   Treatment summary:  1. Patient completed neoadjuvant dose dense Adriamycin and Cytoxan and weekly Taxol X 12 started 02/09/2014 and completed 07/01/14 2. Right double lumpectomy 2:00: IDC grade 3, 3.2 cm, high-grade DCIS, LV I present, PNI present,; 12:00: IDC grade 3; 0.8 cm, high-grade DCIS, LVI, 4/4 lymph nodes positive with ECE, ER 90%, PR 40%, HER-2 negative margin positive T2 N2 M0 stage IIIa 3. Patient is on [facility] clinical trial and she was randomized intraoperatively for NO lymph node dissection. 4.Tamoxifen 20 mg daily started 12/29/2014 stopped July 2016 for adverse effects 5. 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 Palliative XRT to the spine for cord compression 8.Ribociclibwith letrozole plus ovarian suppression started 01/13/2016-06/25/2017 9.  Halaven 07/02/2017-08/20/2017 --------------------------------------------------------------------------------------------------------------------------------------------------------- Liver biopsy 2/25/2019Metastatic carcinoma: positive for cytokeratin 7, GCDFP, and GATA-3 but negative for cytokeratin 20. The immunoprofile is consistent with metastasis from the patient's known breast carcinoma. ER 0%, PR 0%, Ki-67 20%, HER-2 negative ratio 1.23  Current treatmentplan: Carboplatin every 3 weeks Starting 09/17/2017 I requested PDL 1 testing  Bone Mets: Xgeva with calcium and vitamin D Return to clinic in 1 week for toxicity evaluation

## 2017-09-18 ENCOUNTER — Telehealth: Payer: Self-pay | Admitting: Hematology and Oncology

## 2017-09-18 NOTE — Telephone Encounter (Signed)
Called regarding 6/4 °

## 2017-09-24 ENCOUNTER — Inpatient Hospital Stay (HOSPITAL_BASED_OUTPATIENT_CLINIC_OR_DEPARTMENT_OTHER): Payer: Medicare Other | Admitting: Hematology and Oncology

## 2017-09-24 ENCOUNTER — Ambulatory Visit: Payer: Medicare Other | Admitting: Hematology and Oncology

## 2017-09-24 ENCOUNTER — Inpatient Hospital Stay: Payer: Medicare Other | Attending: Hematology and Oncology

## 2017-09-24 ENCOUNTER — Other Ambulatory Visit: Payer: Medicare Other

## 2017-09-24 ENCOUNTER — Ambulatory Visit: Payer: Medicare Other

## 2017-09-24 ENCOUNTER — Inpatient Hospital Stay: Payer: Medicare Other

## 2017-09-24 DIAGNOSIS — C50211 Malignant neoplasm of upper-inner quadrant of right female breast: Secondary | ICD-10-CM

## 2017-09-24 DIAGNOSIS — C7951 Secondary malignant neoplasm of bone: Secondary | ICD-10-CM | POA: Diagnosis not present

## 2017-09-24 DIAGNOSIS — Z170 Estrogen receptor positive status [ER+]: Secondary | ICD-10-CM

## 2017-09-24 DIAGNOSIS — Z95828 Presence of other vascular implants and grafts: Secondary | ICD-10-CM

## 2017-09-24 DIAGNOSIS — Z86711 Personal history of pulmonary embolism: Secondary | ICD-10-CM | POA: Insufficient documentation

## 2017-09-24 DIAGNOSIS — Z7901 Long term (current) use of anticoagulants: Secondary | ICD-10-CM | POA: Diagnosis not present

## 2017-09-24 DIAGNOSIS — C50919 Malignant neoplasm of unspecified site of unspecified female breast: Secondary | ICD-10-CM

## 2017-09-24 DIAGNOSIS — Z5111 Encounter for antineoplastic chemotherapy: Secondary | ICD-10-CM | POA: Insufficient documentation

## 2017-09-24 DIAGNOSIS — C787 Secondary malignant neoplasm of liver and intrahepatic bile duct: Secondary | ICD-10-CM | POA: Insufficient documentation

## 2017-09-24 DIAGNOSIS — C50911 Malignant neoplasm of unspecified site of right female breast: Secondary | ICD-10-CM

## 2017-09-24 LAB — CBC WITH DIFFERENTIAL (CANCER CENTER ONLY)
Basophils Absolute: 0.1 10*3/uL (ref 0.0–0.1)
Basophils Relative: 1 %
Eosinophils Absolute: 0 10*3/uL (ref 0.0–0.5)
Eosinophils Relative: 1 %
HEMATOCRIT: 28.4 % — AB (ref 34.8–46.6)
HEMOGLOBIN: 9.2 g/dL — AB (ref 11.6–15.9)
LYMPHS ABS: 0.7 10*3/uL — AB (ref 0.9–3.3)
LYMPHS PCT: 8 %
MCH: 25.6 pg (ref 25.1–34.0)
MCHC: 32.4 g/dL (ref 31.5–36.0)
MCV: 78.8 fL — AB (ref 79.5–101.0)
Monocytes Absolute: 0.8 10*3/uL (ref 0.1–0.9)
Monocytes Relative: 8 %
NEUTROS ABS: 7.6 10*3/uL — AB (ref 1.5–6.5)
NEUTROS PCT: 82 %
Platelet Count: 461 10*3/uL — ABNORMAL HIGH (ref 145–400)
RBC: 3.6 MIL/uL — ABNORMAL LOW (ref 3.70–5.45)
RDW: 20.2 % — ABNORMAL HIGH (ref 11.2–14.5)
WBC: 9.2 10*3/uL (ref 3.9–10.3)

## 2017-09-24 LAB — CMP (CANCER CENTER ONLY)
ALBUMIN: 2.9 g/dL — AB (ref 3.5–5.0)
ALK PHOS: 197 U/L — AB (ref 40–150)
ALT: 59 U/L — AB (ref 0–55)
AST: 50 U/L — ABNORMAL HIGH (ref 5–34)
Anion gap: 12 — ABNORMAL HIGH (ref 3–11)
BILIRUBIN TOTAL: 0.3 mg/dL (ref 0.2–1.2)
BUN: 12 mg/dL (ref 7–26)
CO2: 24 mmol/L (ref 22–29)
Calcium: 10.1 mg/dL (ref 8.4–10.4)
Chloride: 104 mmol/L (ref 98–109)
Creatinine: 0.63 mg/dL (ref 0.60–1.10)
GFR, Estimated: >60 mL/min (ref >60)
GLUCOSE: 108 mg/dL (ref 70–140)
Potassium: 4 mmol/L (ref 3.5–5.1)
SODIUM: 140 mmol/L (ref 136–145)
Total Protein: 8.9 g/dL — ABNORMAL HIGH (ref 6.4–8.3)

## 2017-09-24 MED ORDER — HEPARIN SOD (PORK) LOCK FLUSH 100 UNIT/ML IV SOLN
500.0000 [IU] | Freq: Once | INTRAVENOUS | Status: AC | PRN
  Administered 2017-09-24: 500 [IU]
  Filled 2017-09-24: qty 5

## 2017-09-24 MED ORDER — PALONOSETRON HCL INJECTION 0.25 MG/5ML
0.2500 mg | Freq: Once | INTRAVENOUS | Status: AC
  Administered 2017-09-24: 0.25 mg via INTRAVENOUS

## 2017-09-24 MED ORDER — SODIUM CHLORIDE 0.9 % IV SOLN
700.0000 mg | Freq: Once | INTRAVENOUS | Status: AC
  Administered 2017-09-24: 700 mg via INTRAVENOUS
  Filled 2017-09-24: qty 70

## 2017-09-24 MED ORDER — SODIUM CHLORIDE 0.9% FLUSH
10.0000 mL | INTRAVENOUS | Status: DC | PRN
  Administered 2017-09-24: 10 mL
  Filled 2017-09-24: qty 10

## 2017-09-24 MED ORDER — SODIUM CHLORIDE 0.9% FLUSH
10.0000 mL | Freq: Once | INTRAVENOUS | Status: AC
  Administered 2017-09-24: 10 mL
  Filled 2017-09-24: qty 10

## 2017-09-24 MED ORDER — SODIUM CHLORIDE 0.9 % IV SOLN
Freq: Once | INTRAVENOUS | Status: AC
  Administered 2017-09-24: 15:00:00 via INTRAVENOUS
  Filled 2017-09-24: qty 5

## 2017-09-24 MED ORDER — DENOSUMAB 120 MG/1.7ML ~~LOC~~ SOLN
SUBCUTANEOUS | Status: AC
  Filled 2017-09-24: qty 1.7

## 2017-09-24 MED ORDER — OXYCODONE-ACETAMINOPHEN 5-325 MG PO TABS
1.0000 | ORAL_TABLET | Freq: Once | ORAL | Status: AC
  Administered 2017-09-24: 1 via ORAL

## 2017-09-24 MED ORDER — SODIUM CHLORIDE 0.9 % IV SOLN
Freq: Once | INTRAVENOUS | Status: AC
  Administered 2017-09-24: 15:00:00 via INTRAVENOUS

## 2017-09-24 MED ORDER — DENOSUMAB 120 MG/1.7ML ~~LOC~~ SOLN
120.0000 mg | Freq: Once | SUBCUTANEOUS | Status: AC
  Administered 2017-09-24: 120 mg via SUBCUTANEOUS

## 2017-09-24 MED ORDER — OXYCODONE-ACETAMINOPHEN 5-325 MG PO TABS
ORAL_TABLET | ORAL | Status: AC
  Filled 2017-09-24: qty 1

## 2017-09-24 MED ORDER — PALONOSETRON HCL INJECTION 0.25 MG/5ML
INTRAVENOUS | Status: AC
  Filled 2017-09-24: qty 5

## 2017-09-24 NOTE — Assessment & Plan Note (Signed)
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   Treatment summary:  1. Patient completed neoadjuvant dose dense Adriamycin and Cytoxan and weekly Taxol X 12 started 02/09/2014 and completed 07/01/14 2. Right double lumpectomy 2:00: IDC grade 3, 3.2 cm, high-grade DCIS, LV I present, PNI present,; 12:00: IDC grade 3; 0.8 cm, high-grade DCIS, LVI, 4/4 lymph nodes positive with ECE, ER 90%, PR 40%, HER-2 negative margin positive T2 N2 M0 stage IIIa 3. Patient is on [employer] clinical trial and she was randomized intraoperatively for NO lymph node dissection. 4.Tamoxifen 20 mg daily started 12/29/2014 stopped July 2016 for adverse effects 5. 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 Palliative XRT to the spine for cord compression 8.Ribociclibwith letrozole plus ovarian suppression started 01/13/2016-06/25/2017 9.Halaven 07/02/2017-08/20/2017 --------------------------------------------------------------------------------------------------------------------------------------------------------- Liver biopsy 2/25/2019Metastatic carcinoma: positive for cytokeratin 7, GCDFP, and GATA-3 but negative for cytokeratin 20. The immunoprofile is consistent with metastasis from the patient's known breast carcinoma. ER 0%, PR 0%, Ki-67 20%, HER-2 negative ratio 1.23  Current treatmentplan: Carboplatin every 3 weeks  today cycle 1  I requested PDL 1 testing  Bone Mets: Xgeva with calcium and vitamin D Pulmonary embolism May 2019: Currently on Xarelto Left hip and left back pain: On fentanyl and Percocet.  PT appears to be controlling her pain.  Her pain is 3 out of 10 today.   Return to clinic in 3 weeks for  cycle 2

## 2017-09-24 NOTE — Progress Notes (Signed)
Patient Care Team: Lucianne Lei, MD as PCP - General (Family Medicine) [name], MD as Consulting Physician (General Surgery) [name], MD as Consulting Physician (Hematology and Oncology) [name], MD as Attending Physician (Radiation Oncology) Benson Norway, RN as Registered Nurse (Oncology) Sylvan Cheese, NP as Nurse Practitioner (Hematology and Oncology)  DIAGNOSIS:  Encounter Diagnosis  Name Primary?  . Malignant neoplasm of upper-inner quadrant of right breast in female, estrogen receptor positive ([place])     SUMMARY OF ONCOLOGIC HISTORY:   Breast cancer of upper-inner quadrant of right female breast ([place])   01/12/2014 Mammogram    Right breast - two lesions: #1 2:00 position 1.6 x 1.9 cm and #2 12:00 position 0.5 x 0.9 cm. Distance between the 2 was 4.5 cm, right axillary lymph node enlargement      01/12/2014 Initial Biopsy    Right breast 3 biopsies: All were IDC with DCIS ER+ PR + Ki-67 85% HER-2 negative: Right axillary lymph node also positive for metastatic cancer ER positive HER-2 negative Ki-67 80% grade 2      01/18/2014 Breast MRI    Right breast 12:00 position 2.8 x 2 x 2.2 cm: 2:00 position 1.3 x 0.8 x 0.5 cm, right axilla multiple enlarged lymph nodes 1 cm in size, right retropectoral lymph node 0.6 cm      01/18/2014 Clinical Stage    Stage IIB T2 N1      01/25/2014 Procedure    Breast/Ovarian ([facility]) reveals no clinically significant variant at ATM, BARD1, BRCA1, BRCA2, BRIP1, CDH1, CHEK2, EPCAM, FANCC, MLH1, MSH2, MSH6, NBN, PALB2, PMS2, PTEN, RAD51C, RAD51D, TP53, and XRCC2.       02/11/2014 - 07/01/2014 Neo-Adjuvant Chemotherapy    Doxorubicin and cyclophosphamide X 4 followed by Taxol weekly x12       07/12/2014 Breast MRI    Partial response to neoadjuvant chemotherapy right breast mass decreased from 2.8 cm to 2.3 cm, 2 small masses along the medial margin also decreased; significant decrease right breast mass 11 to 12:00  and 13 mm to 8 mm, decrease in right axilla LN      08/23/2014 Definitive Surgery    Right lumpectomy/SLNB [name]) 2:00: IDC grade 3, 3.2 cm, high-grade DCIS, LV I present, PNI present,; 12:00: IDC grade 30.8 cm, high-grade DCIS, LV I, 4/4 lymph nodes positive with ECE, ER 90%, PR 40%, HER-2 negative margin positive      08/23/2014 Pathologic Stage    Stage IIIA: ypT2 ypN2a      09/15/2014 Surgery    Reexcision of margins: clear; no evidence of malignancy      11/03/2014 - 12/14/2014 Radiation Therapy    Adjuvant XRT Isidore Moos): Right Breast, supraclavicular nodes, axillary nodes, and internal mammary nodes: 50 Gy over 25 fractions; right breast boost: 10 Gy over 5 fractions. Total dose: 60 Gy      12/29/2014 -  Anti-estrogen oral therapy    Tamoxifen 20 mg daily      04/08/2015 Survivorship    Survivorship care plan completed and mailed to patient in lieu of in person visit at her request      12/31/2015 Relapse/Recurrence    Innumerable osseous metastases with acute pathologic fracture the L4 vertebral body. 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 L5-S1 protrusion with left more than right S1 impingement      01/05/2016 - 01/12/2016 Radiation Therapy    Palliative radiation to the spine for cord compression      01/13/2016 -  Anti-estrogen oral therapy    Ribociclib 3 weeks on and 1 week off and Letrozole (with Zolodex until oophorectomy) and Xgeva for bone mets      06/17/2017 Pathology Results    Liver biopsy: Metastatic carcinoma: positive for cytokeratin 7, GCDFP, and GATA-3 but negative for cytokeratin 20. The immunoprofile is consistent with metastasis from the patient's known breast carcinoma.  ER 0%, PR 0%, Ki-67 20%, HER-2 negative ratio 1.23      07/02/2017 - 08/20/2017 Chemotherapy    [place]aven day 1 day 8 every 3 weeks      09/02/2017 Imaging    Progression of disease.development of mediastinal lymph nodes 1.1 cm subcarinal node previously 0.5 cm,  new 7 mm prevascular lymph node, small bilateral lung nodules 2 mm and 7 mm, increase in sclerotic lesions in the thoracic spine especially T8 and proximal left humerus;  Inc in liver mets 2.8 cm (was 1.5 cm), left hepatic lobe 1.9 cm was 0.9 cm, L4 compression fracture      09/17/2017 -  Chemotherapy    Carboplatin every 3 weeks        Breast carcinoma metastatic to multiple sites ([place])   12/31/2015 Initial Diagnosis    Breast carcinoma metastatic to multiple sites ([place])      09/03/2017 -  Chemotherapy    The patient had palonosetron (ALOXI) injection 0.25 mg, 0.25 mg, Intravenous,  Once, 1 of 6 cycles CARBOplatin (PARAPLATIN) 700 mg in sodium chloride 0.9 % 250 mL chemo infusion, 700 mg (100 % of original dose 700 mg), Intravenous,  Once, 1 of 6 cycles Dose modification: 700 mg (original dose 700 mg, Cycle 1) fosaprepitant (EMEND) 150 mg, dexamethasone (DECADRON) 12 mg in sodium chloride 0.9 % 145 mL IVPB, , Intravenous,  Once, 1 of 6 cycles  for chemotherapy treatment.        CHIEF COMPLIANT: Cycle 1 carboplatin  INTERVAL HISTORY: Debbie Martinez is a 42 year old with above-mentioned history of metastatic breast cancer who is here to receive her first cycle of chemotherapy with carboplatin.  She has had multiple aches and pains including bilateral hip pains which are currently being managed with narcotic pain medications which include fentanyl and Percocets.  She is continuing to have pain in the hips as well as now in the back.  The pain is gotten better with application of Aspercreme.    REVIEW OF SYSTEMS:   Constitutional: Denies fevers, chills or abnormal weight loss Eyes: Denies blurriness of vision Ears, nose, mouth, throat, and face: Denies mucositis or sore throat Respiratory: Denies cough, dyspnea or wheezes Cardiovascular: Denies palpitation, chest discomfort Gastrointestinal:  Denies nausea, heartburn or change in bowel habits Skin: Denies abnormal skin  rashes Lymphatics: Denies new lymphadenopathy or easy bruising Neurological:Denies numbness, tingling or new weaknesses Behavioral/Psych: Mood is stable, no new changes  Extremities: No lower extremity edema  All other systems were reviewed with the patient and are negative.  I have reviewed the past medical history, past surgical history, social history and family history with the patient and they are unchanged from previous note.  ALLERGIES:  is allergic to lisinopril and tamoxifen.  MEDICATIONS:  Current Outpatient Medications  Medication Sig Dispense Refill  . ACCU-CHEK AVIVA PLUS test strip USE AS DIRECTED 4 TIMES A DAY 1 each 12  . ACCU-CHEK SOFTCLIX LANCETS lancets 100 each by Other route 4 (four) times daily. Use as instructed 100 each 0  . acetaminophen (TYLENOL) 500 MG tablet Take 1,000 mg by mouth every 4 (four)  hours as needed for moderate pain.    Marland Kitchen azithromycin (ZITHROMAX) 250 MG tablet Take 1 tablet (250 mg total) by mouth daily. 3 each 0  . blood glucose meter kit and supplies KIT Dispense based on patient and insurance preference. Use up to four times daily as directed. (FOR ICD-9 250.00, 250.01). 1 each 0  . Cholecalciferol (VITAMIN D-3 PO) Take 1 capsule by mouth daily.    Marland Kitchen ENSURE (ENSURE) Take 237 mLs by mouth 2 (two) times daily between meals.     . fentaNYL (DURAGESIC - DOSED MCG/HR) 25 MCG/HR patch Place 1 patch (25 mcg total) onto the skin every 3 (three) days. 10 patch 0  . fluconazole (DIFLUCAN) 100 MG tablet Take 1 tablet (100 mg total) by mouth daily. 7 tablet 0  . fluticasone (FLONASE) 50 MCG/ACT nasal spray Place 2 sprays into both nostrils 2 (two) times daily as needed for allergies.  2  . Fluticasone Furoate-Vilanterol (BREO ELLIPTA IN) Inhale 1 puff into the lungs daily as needed (Shortness of breath).    . gabapentin (NEURONTIN) 300 MG capsule Take 1 capsule (300 mg total) by mouth 3 (three) times daily. 90 capsule 5  . IRON PO Take 1 tablet by mouth 2  (two) times daily.    . Iron-Vitamins (GERITOL) LIQD Take 5 mLs by mouth daily.    Marland Kitchen JANUMET 50-500 MG tablet Take 1 tablet by mouth daily.  0  . lidocaine-prilocaine (EMLA) cream Apply to affected area once 30 g 3  . Lidocaine-Prilocaine, Bulk, 2.5-2.5 % CREA Apply 5 mLs topically as needed. 2 hours before use, cover with plawtic wrap. 30 g 2  . LORazepam (ATIVAN) 0.5 MG tablet Take 1 tablet (0.5 mg total) by mouth at bedtime as needed (Nausea or vomiting). 30 tablet 0  . omeprazole (PRILOSEC) 20 MG capsule Take 1 capsule (20 mg total) by mouth daily. Take while using dexamethasone to prevent heartburn. (Patient taking differently: Take 20 mg by mouth daily as needed (Heartburn). ) 30 capsule 2  . ondansetron (ZOFRAN) 8 MG tablet Take 1 tablet (8 mg total) by mouth 2 (two) times daily as needed for refractory nausea / vomiting. Start on day 3 after chemo. 30 tablet 1  . oxyCODONE-acetaminophen (PERCOCET/ROXICET) 5-325 MG tablet Take 1 tablet by mouth every 6 (six) hours as needed for severe pain. (Patient taking differently: Take 1-2 tablets by mouth every 6 (six) hours as needed for severe pain. ) 60 tablet 0  . polyethylene glycol (MIRALAX / GLYCOLAX) packet Take 17 g by mouth daily. (Patient taking differently: Take 17 g by mouth daily as needed for mild constipation. ) 14 each 0  . prochlorperazine (COMPAZINE) 10 MG tablet Take 1 tablet (10 mg total) by mouth every 6 (six) hours as needed (Nausea or vomiting). 30 tablet 1  . Rivaroxaban (XARELTO) 15 MG TABS tablet Take 1 tablet (15 mg total) by mouth 2 (two) times daily with a meal. 37 tablet 0  . [START ON 09/26/2017] rivaroxaban (XARELTO) 20 MG TABS tablet Take 1 tablet (20 mg total) by mouth daily with supper. 30 tablet 0  . traMADol (ULTRAM) 50 MG tablet Take 1 tablet (50 mg total) by mouth every 6 (six) hours as needed. 45 tablet 0  . valsartan (DIOVAN) 80 MG tablet Take 1 tablet (80 mg total) by mouth daily. 30 tablet 0   No current  facility-administered medications for this visit.    Facility-Administered Medications Ordered in Other Visits  Medication Dose Route Frequency  Provider Last Rate Last Dose  . 0.9 %  sodium chloride infusion   Intravenous Once [name], MD      . CARBOplatin (PARAPLATIN) 700 mg in sodium chloride 0.9 % 250 mL chemo infusion  700 mg Intravenous Once [name], MD      . fosaprepitant (EMEND) 150 mg, dexamethasone (DECADRON) 12 mg in sodium chloride 0.9 % 145 mL IVPB   Intravenous Once [name], MD      . heparin lock flush 100 unit/mL  500 Units Intracatheter Once PRN [name], MD      . oxyCODONE-acetaminophen (PERCOCET/ROXICET) 5-325 MG per tablet 1 tablet  1 tablet Oral Once [name], MD      . palonosetron (ALOXI) injection 0.25 mg  0.25 mg Intravenous Once [name], MD      . sodium chloride flush (NS) 0.9 % injection 10 mL  10 mL Intracatheter PRN [name], MD        PHYSICAL EXAMINATION: ECOG PERFORMANCE STATUS: 1 - Symptomatic but completely ambulatory  Vitals:   09/24/17 1401  BP: 117/87  Pulse: (!) 111  Resp: 18  Temp: 98.7 F (37.1 C)  SpO2: 100%   Filed Weights   09/24/17 1401  Weight: 155 lb 1.6 oz (70.4 kg)    GENERAL:alert, no distress and comfortable SKIN: skin color, texture, turgor are normal, no rashes or significant lesions EYES: normal, Conjunctiva are pink and non-injected, sclera clear OROPHARYNX:no exudate, no erythema and lips, buccal mucosa, and tongue normal  NECK: supple, thyroid normal size, non-tender, without nodularity LYMPH:  no palpable lymphadenopathy in the cervical, axillary or inguinal LUNGS: clear to auscultation and percussion with normal breathing effort HEART: regular rate & rhythm and no murmurs and no lower extremity edema ABDOMEN:abdomen soft, non-tender and normal bowel sounds MUSCULOSKELETAL:no cyanosis of digits and no clubbing  NEURO: alert & oriented x 3 with fluent speech, no focal  motor/sensory deficits EXTREMITIES: No lower extremity edema  LABORATORY DATA:  I have reviewed the data as listed CMP Latest Ref Rng & Units 09/24/2017 09/10/2017 09/05/2017  Glucose 70 - 140 mg/dL 108 221(H) 181(H)  BUN 7 - 26 mg/dL _0 Creatinine 0.60 - 1.10 mg/dL 0.63 0.72 0.49  Sodium 136 - 145 mmol/L 140 135(L) 137  Potassium 3.5 - 5.1 mmol/L 4.0 4.1 4.0  Chloride 98 - 109 mmol/L 104 102 104  CO2 22 - 29 mmol/L _1 Calcium 8.4 - 10.4 mg/dL 10.1 9.2 8.0(L)  Total Protein 6.4 - 8.3 g/dL 8.9(H) 8.6(H) -  Total Bilirubin 0.2 - 1.2 mg/dL 0.3 0.5 -  Alkaline Phos 40 - 150 U/L 197(H) 111 -  AST 5 - 34 U/L 50(H) 29 -  ALT 0 - 55 U/L 59(H) 29 -    Lab Results  Component Value Date   WBC 9.2 09/24/2017   HGB 9.2 (L) 09/24/2017   HCT 28.4 (L) 09/24/2017   MCV 78.8 (L) 09/24/2017   PLT 461 (H) 09/24/2017   NEUTROABS 7.6 (H) 09/24/2017    ASSESSMENT & PLAN:  Breast cancer of upper-inner quadrant of right female breast ([place]) Right breast invasive ductal carcinoma ER/PR positive HER-2 negative multifocal disease T2 N1 stage IIB clinical stage grade 2 with biopsy-proven axillary lymph node metastases.   Treatment summary:  1. Patient completed neoadjuvant dose dense Adriamycin and Cytoxan and weekly Taxol X 12 started 02/09/2014 and completed 07/01/14 2. Right double lumpectomy 2:00: IDC grade 3, 3.2 cm, high-grade DCIS, LV I  present, PNI present,; 12:00: IDC grade 3; 0.8 cm, high-grade DCIS, LVI, 4/4 lymph nodes positive with ECE, ER 90%, PR 40%, HER-2 negative margin positive T2 N2 M0 stage IIIa 3. Patient is on [facility] clinical trial and she was randomized intraoperatively for NO lymph node dissection. 4.Tamoxifen 20 mg daily started 12/29/2014 stopped July 2016 for adverse effects 5. 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 Palliative XRT to the spine for  cord compression 8.Ribociclibwith letrozole plus ovarian suppression started 01/13/2016-06/25/2017 9.[place]aven 07/02/2017-08/20/2017 --------------------------------------------------------------------------------------------------------------------------------------------------------- Liver biopsy 2/25/2019Metastatic carcinoma: positive for cytokeratin 7, GCDFP, and GATA-3 but negative for cytokeratin 20. The immunoprofile is consistent with metastasis from the patient's known breast carcinoma. ER 0%, PR 0%, Ki-67 20%, HER-2 negative ratio 1.23  Current treatmentplan: Carboplatin every 3 weeks  today cycle 1  I requested PDL 1 testing  Bone Mets: Xgeva with calcium and vitamin D Pulmonary embolism May 2019: Currently on Xarelto Left hip and left back pain: On fentanyl and Percocet.  PT appears to be controlling her pain.  Her pain is 3 out of 10 today.   Return to clinic in 3 weeks for  cycle 2      No orders of the defined types were placed in this encounter.  The patient has a good understanding of the overall plan. she agrees with it. she will call with any problems that may develop before the next visit here.   Harriette Ohara, MD 09/24/17

## 2017-09-24 NOTE — Patient Instructions (Addendum)
[facility] Discharge Instructions for Patients Receiving Chemotherapy  Today you received the following chemotherapy agents Carboplatin To help prevent nausea and vomiting after your treatment, we encourage you to take your nausea medication as directed   If you develop nausea and vomiting that is not controlled by your nausea medication, call the clinic.   BELOW ARE SYMPTOMS THAT SHOULD BE REPORTED IMMEDIATELY:  *FEVER GREATER THAN 100.5 F  *CHILLS WITH OR WITHOUT FEVER  NAUSEA AND VOMITING THAT IS NOT CONTROLLED WITH YOUR NAUSEA MEDICATION  *UNUSUAL SHORTNESS OF BREATH  *UNUSUAL BRUISING OR BLEEDING  TENDERNESS IN MOUTH AND THROAT WITH OR WITHOUT PRESENCE OF ULCERS  *URINARY PROBLEMS  *BOWEL PROBLEMS  UNUSUAL RASH Items with * indicate a potential emergency and should be followed up as soon as possible.  Feel free to call the clinic should you have any questions or concerns. The clinic phone number is [phone].  Please show the CHEMO ALERT CARD at check-in to the Emergency Department and triage nurse.      Carboplatin injection What is this medicine? CARBOPLATIN (KAR boe pla tin) is a chemotherapy drug. It targets fast dividing cells, like cancer cells, and causes these cells to die. This medicine is used to treat ovarian cancer and many other cancers. This medicine may be used for other purposes; ask your health care provider or pharmacist if you have questions. COMMON BRAND NAME(S): Paraplatin What should I tell my health care provider before I take this medicine? They need to know if you have any of these conditions: -blood disorders -hearing problems -kidney disease -recent or ongoing radiation therapy -an unusual or allergic reaction to carboplatin, cisplatin, other chemotherapy, other medicines, foods, dyes, or preservatives -pregnant or trying to get pregnant -breast-feeding How should I use this medicine? This drug is usually given as  an infusion into a vein. It is administered in a hospital or clinic by a specially trained health care professional. Talk to your pediatrician regarding the use of this medicine in children. Special care may be needed. Overdosage: If you think you have taken too much of this medicine contact a poison control center or emergency room at once. NOTE: This medicine is only for you. Do not share this medicine with others. What if I miss a dose? It is important not to miss a dose. Call your doctor or health care professional if you are unable to keep an appointment. What may interact with this medicine? -medicines for seizures -medicines to increase blood counts like filgrastim, pegfilgrastim, sargramostim -some antibiotics like amikacin, gentamicin, neomycin, streptomycin, tobramycin -vaccines Talk to your doctor or health care professional before taking any of these medicines: -acetaminophen -aspirin -ibuprofen -ketoprofen -naproxen This list may not describe all possible interactions. Give your health care provider a list of all the medicines, herbs, non-prescription drugs, or dietary supplements you use. Also tell them if you smoke, drink alcohol, or use illegal drugs. Some items may interact with your medicine. What should I watch for while using this medicine? Your condition will be monitored carefully while you are receiving this medicine. You will need important blood work done while you are taking this medicine. This drug may make you feel generally unwell. This is not uncommon, as chemotherapy can affect healthy cells as well as cancer cells. Report any side effects. Continue your course of treatment even though you feel ill unless your doctor tells you to stop. In some cases, you may be given additional medicines to help with side   effects. Follow all directions for their use. Call your doctor or health care professional for advice if you get a fever, chills or sore throat, or other  symptoms of a cold or flu. Do not treat yourself. This drug decreases your body's ability to fight infections. Try to avoid being around people who are sick. This medicine may increase your risk to bruise or bleed. Call your doctor or health care professional if you notice any unusual bleeding. Be careful brushing and flossing your teeth or using a toothpick because you may get an infection or bleed more easily. If you have any dental work done, tell your dentist you are receiving this medicine. Avoid taking products that contain aspirin, acetaminophen, ibuprofen, naproxen, or ketoprofen unless instructed by your doctor. These medicines may hide a fever. Do not become pregnant while taking this medicine. Women should inform their doctor if they wish to become pregnant or think they might be pregnant. There is a potential for serious side effects to an unborn child. Talk to your health care professional or pharmacist for more information. Do not breast-feed an infant while taking this medicine. What side effects may I notice from receiving this medicine? Side effects that you should report to your doctor or health care professional as soon as possible: -allergic reactions like skin rash, itching or hives, swelling of the face, lips, or tongue -signs of infection - fever or chills, cough, sore throat, pain or difficulty passing urine -signs of decreased platelets or bleeding - bruising, pinpoint red spots on the skin, black, tarry stools, nosebleeds -signs of decreased red blood cells - unusually weak or tired, fainting spells, lightheadedness -breathing problems -changes in hearing -changes in vision -chest pain -high blood pressure -low blood counts - This drug may decrease the number of white blood cells, red blood cells and platelets. You may be at increased risk for infections and bleeding. -nausea and vomiting -pain, swelling, redness or irritation at the injection site -pain, tingling,  numbness in the hands or feet -problems with balance, talking, walking -trouble passing urine or change in the amount of urine Side effects that usually do not require medical attention (report to your doctor or health care professional if they continue or are bothersome): -hair loss -loss of appetite -metallic taste in the mouth or changes in taste This list may not describe all possible side effects. Call your doctor for medical advice about side effects. You may report side effects to FDA at 1-800-FDA-1088. Where should I keep my medicine? This drug is given in a hospital or clinic and will not be stored at home. NOTE: This sheet is a summary. It may not cover all possible information. If you have questions about this medicine, talk to your doctor, pharmacist, or health care provider.  2018 Elsevier/Gold Standard (2007-07-15 14:38:05)     

## 2017-10-01 ENCOUNTER — Ambulatory Visit: Payer: Medicare Other | Admitting: Hematology and Oncology

## 2017-10-01 ENCOUNTER — Other Ambulatory Visit: Payer: Medicare Other

## 2017-10-01 ENCOUNTER — Ambulatory Visit: Payer: Medicare Other

## 2017-10-03 DIAGNOSIS — Z0000 Encounter for general adult medical examination without abnormal findings: Secondary | ICD-10-CM | POA: Diagnosis not present

## 2017-10-04 ENCOUNTER — Other Ambulatory Visit: Payer: Self-pay | Admitting: *Deleted

## 2017-10-04 NOTE — Telephone Encounter (Signed)
Message received from pt stating " I need a refill on my oxycodone "  No return number given nor any other identifying information - triage nurse was able to give number call originated from.  Upon returning call - obtained an identified VM but recording stating " mailbox is full ".

## 2017-10-07 ENCOUNTER — Other Ambulatory Visit: Payer: Self-pay

## 2017-10-07 DIAGNOSIS — C7951 Secondary malignant neoplasm of bone: Secondary | ICD-10-CM

## 2017-10-07 MED ORDER — OXYCODONE-ACETAMINOPHEN 5-325 MG PO TABS: 1.0000 | ORAL_TABLET | Freq: Four times a day (QID) | ORAL | 0 refills | Status: DC | PRN

## 2017-10-07 NOTE — Telephone Encounter (Signed)
Pt called requesting for prescription pain medication refill for percocet. Pt states that she is out and is in a lot of pain. Will refill for pt and pt will be in today for pick up.

## 2017-10-12 ENCOUNTER — Other Ambulatory Visit: Payer: Self-pay

## 2017-10-12 ENCOUNTER — Emergency Department (HOSPITAL_COMMUNITY): Payer: Medicare Other

## 2017-10-12 ENCOUNTER — Encounter (HOSPITAL_COMMUNITY): Payer: Self-pay | Admitting: Certified Registered Nurse Anesthetist

## 2017-10-12 ENCOUNTER — Inpatient Hospital Stay (HOSPITAL_COMMUNITY): Payer: Medicare Other

## 2017-10-12 ENCOUNTER — Inpatient Hospital Stay (HOSPITAL_COMMUNITY)
Admission: EM | Admit: 2017-10-12 | Discharge: 2017-10-18 | DRG: 871 | Disposition: A | Discharge status: Home Health | Payer: Medicare Other | Attending: Internal Medicine | Admitting: Internal Medicine

## 2017-10-12 ENCOUNTER — Encounter (HOSPITAL_COMMUNITY): Payer: Self-pay

## 2017-10-12 DIAGNOSIS — R0689 Other abnormalities of breathing: Secondary | ICD-10-CM | POA: Diagnosis not present

## 2017-10-12 DIAGNOSIS — J189 Pneumonia, unspecified organism: Secondary | ICD-10-CM | POA: Diagnosis not present

## 2017-10-12 DIAGNOSIS — I2699 Other pulmonary embolism without acute cor pulmonale: Secondary | ICD-10-CM | POA: Diagnosis not present

## 2017-10-12 DIAGNOSIS — C787 Secondary malignant neoplasm of liver and intrahepatic bile duct: Secondary | ICD-10-CM | POA: Diagnosis not present

## 2017-10-12 DIAGNOSIS — R590 Localized enlarged lymph nodes: Secondary | ICD-10-CM | POA: Diagnosis not present

## 2017-10-12 DIAGNOSIS — R112 Nausea with vomiting, unspecified: Secondary | ICD-10-CM

## 2017-10-12 DIAGNOSIS — Z9221 Personal history of antineoplastic chemotherapy: Secondary | ICD-10-CM

## 2017-10-12 DIAGNOSIS — C7951 Secondary malignant neoplasm of bone: Secondary | ICD-10-CM | POA: Diagnosis not present

## 2017-10-12 DIAGNOSIS — T451X5A Adverse effect of antineoplastic and immunosuppressive drugs, initial encounter: Secondary | ICD-10-CM | POA: Diagnosis present

## 2017-10-12 DIAGNOSIS — Z853 Personal history of malignant neoplasm of breast: Secondary | ICD-10-CM | POA: Diagnosis not present

## 2017-10-12 DIAGNOSIS — Y95 Nosocomial condition: Secondary | ICD-10-CM | POA: Diagnosis present

## 2017-10-12 DIAGNOSIS — R1011 Right upper quadrant pain: Secondary | ICD-10-CM

## 2017-10-12 DIAGNOSIS — R531 Weakness: Secondary | ICD-10-CM

## 2017-10-12 DIAGNOSIS — K219 Gastro-esophageal reflux disease without esophagitis: Secondary | ICD-10-CM | POA: Diagnosis not present

## 2017-10-12 DIAGNOSIS — K810 Acute cholecystitis: Secondary | ICD-10-CM | POA: Diagnosis present

## 2017-10-12 DIAGNOSIS — Z923 Personal history of irradiation: Secondary | ICD-10-CM

## 2017-10-12 DIAGNOSIS — B957 Other staphylococcus as the cause of diseases classified elsewhere: Secondary | ICD-10-CM | POA: Diagnosis not present

## 2017-10-12 DIAGNOSIS — N390 Urinary tract infection, site not specified: Secondary | ICD-10-CM | POA: Diagnosis not present

## 2017-10-12 DIAGNOSIS — D701 Agranulocytosis secondary to cancer chemotherapy: Secondary | ICD-10-CM | POA: Diagnosis not present

## 2017-10-12 DIAGNOSIS — Y842 Radiological procedure and radiotherapy as the cause of abnormal reaction of the patient, or of later complication, without mention of misadventure at the time of the procedure: Secondary | ICD-10-CM | POA: Diagnosis present

## 2017-10-12 DIAGNOSIS — R000 Tachycardia, unspecified: Secondary | ICD-10-CM | POA: Diagnosis not present

## 2017-10-12 DIAGNOSIS — E119 Type 2 diabetes mellitus without complications: Secondary | ICD-10-CM | POA: Diagnosis not present

## 2017-10-12 DIAGNOSIS — Z7901 Long term (current) use of anticoagulants: Secondary | ICD-10-CM

## 2017-10-12 DIAGNOSIS — J700 Acute pulmonary manifestations due to radiation: Secondary | ICD-10-CM | POA: Diagnosis present

## 2017-10-12 DIAGNOSIS — E43 Unspecified severe protein-calorie malnutrition: Secondary | ICD-10-CM | POA: Diagnosis not present

## 2017-10-12 DIAGNOSIS — Z86711 Personal history of pulmonary embolism: Secondary | ICD-10-CM

## 2017-10-12 DIAGNOSIS — I10 Essential (primary) hypertension: Secondary | ICD-10-CM | POA: Diagnosis not present

## 2017-10-12 DIAGNOSIS — R0789 Other chest pain: Secondary | ICD-10-CM

## 2017-10-12 DIAGNOSIS — Z79899 Other long term (current) drug therapy: Secondary | ICD-10-CM | POA: Diagnosis not present

## 2017-10-12 DIAGNOSIS — C50919 Malignant neoplasm of unspecified site of unspecified female breast: Secondary | ICD-10-CM | POA: Diagnosis present

## 2017-10-12 DIAGNOSIS — E118 Type 2 diabetes mellitus with unspecified complications: Secondary | ICD-10-CM

## 2017-10-12 DIAGNOSIS — J90 Pleural effusion, not elsewhere classified: Secondary | ICD-10-CM | POA: Diagnosis not present

## 2017-10-12 DIAGNOSIS — G893 Neoplasm related pain (acute) (chronic): Secondary | ICD-10-CM | POA: Diagnosis not present

## 2017-10-12 DIAGNOSIS — D6481 Anemia due to antineoplastic chemotherapy: Secondary | ICD-10-CM | POA: Diagnosis present

## 2017-10-12 DIAGNOSIS — Z66 Do not resuscitate: Secondary | ICD-10-CM | POA: Diagnosis present

## 2017-10-12 DIAGNOSIS — Z7189 Other specified counseling: Secondary | ICD-10-CM

## 2017-10-12 DIAGNOSIS — K828 Other specified diseases of gallbladder: Secondary | ICD-10-CM | POA: Diagnosis not present

## 2017-10-12 DIAGNOSIS — Z6824 Body mass index (BMI) 24.0-24.9, adult: Secondary | ICD-10-CM

## 2017-10-12 DIAGNOSIS — C50211 Malignant neoplasm of upper-inner quadrant of right female breast: Secondary | ICD-10-CM | POA: Diagnosis not present

## 2017-10-12 DIAGNOSIS — D6959 Other secondary thrombocytopenia: Secondary | ICD-10-CM | POA: Diagnosis present

## 2017-10-12 DIAGNOSIS — F419 Anxiety disorder, unspecified: Secondary | ICD-10-CM | POA: Diagnosis present

## 2017-10-12 DIAGNOSIS — E46 Unspecified protein-calorie malnutrition: Secondary | ICD-10-CM | POA: Diagnosis not present

## 2017-10-12 DIAGNOSIS — D638 Anemia in other chronic diseases classified elsewhere: Secondary | ICD-10-CM | POA: Diagnosis not present

## 2017-10-12 DIAGNOSIS — R918 Other nonspecific abnormal finding of lung field: Secondary | ICD-10-CM | POA: Diagnosis not present

## 2017-10-12 DIAGNOSIS — Z515 Encounter for palliative care: Secondary | ICD-10-CM

## 2017-10-12 DIAGNOSIS — Z170 Estrogen receptor positive status [ER+]: Secondary | ICD-10-CM | POA: Diagnosis not present

## 2017-10-12 DIAGNOSIS — T451X5S Adverse effect of antineoplastic and immunosuppressive drugs, sequela: Secondary | ICD-10-CM | POA: Diagnosis not present

## 2017-10-12 DIAGNOSIS — A419 Sepsis, unspecified organism: Secondary | ICD-10-CM | POA: Diagnosis not present

## 2017-10-12 DIAGNOSIS — C799 Secondary malignant neoplasm of unspecified site: Secondary | ICD-10-CM | POA: Diagnosis not present

## 2017-10-12 LAB — CBC WITH DIFFERENTIAL/PLATELET
Basophils Absolute: 0 10*3/uL (ref 0.0–0.1)
Basophils Relative: 0 %
EOS PCT: 0 %
Eosinophils Absolute: 0 10*3/uL (ref 0.0–0.7)
HEMATOCRIT: 23.7 % — AB (ref 36.0–46.0)
Hemoglobin: 7.4 g/dL — ABNORMAL LOW (ref 12.0–15.0)
Lymphocytes Relative: 14 %
Lymphs Abs: 1 10*3/uL (ref 0.7–4.0)
MCH: 25.3 pg — AB (ref 26.0–34.0)
MCHC: 31.2 g/dL (ref 30.0–36.0)
MCV: 81.2 fL (ref 78.0–100.0)
MONOS PCT: 13 %
Monocytes Absolute: 0.9 10*3/uL (ref 0.1–1.0)
NEUTROS ABS: 5.4 10*3/uL (ref 1.7–7.7)
Neutrophils Relative %: 73 %
Platelets: 196 10*3/uL (ref 150–400)
RBC: 2.92 MIL/uL — ABNORMAL LOW (ref 3.87–5.11)
RDW: 20.6 % — AB (ref 11.5–15.5)
WBC: 7.3 10*3/uL (ref 4.0–10.5)

## 2017-10-12 LAB — COMPREHENSIVE METABOLIC PANEL
ALK PHOS: 205 U/L — AB (ref 38–126)
ALT: 80 U/L — AB (ref 14–54)
AST: 69 U/L — AB (ref 15–41)
Albumin: 2.7 g/dL — ABNORMAL LOW (ref 3.5–5.0)
Anion gap: 14 (ref 5–15)
BUN: 12 mg/dL (ref 6–20)
CHLORIDE: 99 mmol/L — AB (ref 101–111)
CO2: 21 mmol/L — AB (ref 22–32)
Calcium: 8.5 mg/dL — ABNORMAL LOW (ref 8.9–10.3)
Creatinine, Ser: 0.51 mg/dL (ref 0.44–1.00)
GFR calc Af Amer: >60 mL/min (ref >60)
Glucose, Bld: 125 mg/dL — ABNORMAL HIGH (ref 65–99)
Potassium: 4.5 mmol/L (ref 3.5–5.1)
SODIUM: 134 mmol/L — AB (ref 135–145)
Total Bilirubin: 1.5 mg/dL — ABNORMAL HIGH (ref 0.3–1.2)
Total Protein: 7.8 g/dL (ref 6.5–8.1)

## 2017-10-12 LAB — I-STAT TROPONIN, ED: TROPONIN I, POC: 0.01 ng/mL (ref 0.00–0.08)

## 2017-10-12 LAB — URINALYSIS, ROUTINE W REFLEX MICROSCOPIC
Bilirubin Urine: NEGATIVE
Glucose, UA: NEGATIVE mg/dL
Ketones, ur: 20 mg/dL — AB
Nitrite: NEGATIVE
PROTEIN: NEGATIVE mg/dL
Specific Gravity, Urine: 1.008 (ref 1.005–1.030)
pH: 5 (ref 5.0–8.0)

## 2017-10-12 LAB — LIPASE, BLOOD: LIPASE: 18 U/L (ref 11–51)

## 2017-10-12 LAB — I-STAT CG4 LACTIC ACID, ED
Lactic Acid, Venous: 1.97 mmol/L — ABNORMAL HIGH (ref 0.5–1.9)
Lactic Acid, Venous: 1.6 mmol/L (ref 0.5–1.9)

## 2017-10-12 LAB — GLUCOSE, CAPILLARY
GLUCOSE-CAPILLARY: 97 mg/dL (ref 65–99)
Glucose-Capillary: 74 mg/dL (ref 65–99)
Glucose-Capillary: 65 mg/dL (ref 65–99)
Glucose-Capillary: 137 mg/dL — ABNORMAL HIGH (ref 65–99)

## 2017-10-12 LAB — PROTIME-INR
INR: 4.23
Prothrombin Time: 40.4 seconds — ABNORMAL HIGH (ref 11.4–15.2)

## 2017-10-12 LAB — I-STAT BETA HCG BLOOD, ED (MC, WL, AP ONLY)

## 2017-10-12 LAB — LACTIC ACID, PLASMA: LACTIC ACID, VENOUS: 1.9 mmol/L (ref 0.5–1.9)

## 2017-10-12 LAB — PREALBUMIN: Prealbumin: <5 mg/dL — ABNORMAL LOW (ref 18–38)

## 2017-10-12 MED ORDER — SODIUM CHLORIDE 0.9% FLUSH
10.0000 mL | Freq: Two times a day (BID) | INTRAVENOUS | Status: DC
  Administered 2017-10-16 – 2017-10-18 (×4): 10 mL

## 2017-10-12 MED ORDER — ONDANSETRON HCL 4 MG/2ML IJ SOLN
4.0000 mg | Freq: Four times a day (QID) | INTRAMUSCULAR | Status: DC | PRN
  Administered 2017-10-13 – 2017-10-18 (×5): 4 mg via INTRAVENOUS
  Filled 2017-10-12 (×5): qty 2

## 2017-10-12 MED ORDER — SODIUM CHLORIDE 0.9 % IV SOLN
INTRAVENOUS | Status: DC
  Administered 2017-10-12: 13:00:00 via INTRAVENOUS

## 2017-10-12 MED ORDER — SODIUM CHLORIDE 0.9 % IV BOLUS
30.0000 mL/kg | Freq: Once | INTRAVENOUS | Status: AC
  Administered 2017-10-12: 2109 mL via INTRAVENOUS

## 2017-10-12 MED ORDER — MORPHINE SULFATE (PF) 2 MG/ML IV SOLN
2.0000 mg | Freq: Once | INTRAVENOUS | Status: AC
  Administered 2017-10-12: 2 mg via INTRAVENOUS
  Filled 2017-10-12: qty 1

## 2017-10-12 MED ORDER — SODIUM CHLORIDE 0.9% FLUSH
10.0000 mL | INTRAVENOUS | Status: DC | PRN
  Administered 2017-10-14: 10 mL
  Filled 2017-10-12: qty 40

## 2017-10-12 MED ORDER — ENOXAPARIN SODIUM 80 MG/0.8ML ~~LOC~~ SOLN: 1.0000 mg/kg | Freq: Once | SUBCUTANEOUS | Status: DC

## 2017-10-12 MED ORDER — ONDANSETRON HCL 4 MG PO TABS
4.0000 mg | ORAL_TABLET | Freq: Four times a day (QID) | ORAL | Status: DC | PRN
  Administered 2017-10-16: 4 mg via ORAL
  Filled 2017-10-12: qty 1

## 2017-10-12 MED ORDER — LORAZEPAM 0.5 MG PO TABS
0.5000 mg | ORAL_TABLET | Freq: Every evening | ORAL | Status: DC | PRN
  Administered 2017-10-16: 0.5 mg via ORAL
  Filled 2017-10-12 (×2): qty 1

## 2017-10-12 MED ORDER — KETOROLAC TROMETHAMINE 30 MG/ML IJ SOLN
30.0000 mg | Freq: Four times a day (QID) | INTRAMUSCULAR | Status: DC | PRN
  Administered 2017-10-12 – 2017-10-13 (×3): 30 mg via INTRAVENOUS
  Filled 2017-10-12 (×3): qty 1

## 2017-10-12 MED ORDER — FENTANYL 25 MCG/HR TD PT72: 25.0000 ug | MEDICATED_PATCH | TRANSDERMAL | Status: DC

## 2017-10-12 MED ORDER — TRAMADOL HCL 50 MG PO TABS: 50.0000 mg | ORAL_TABLET | Freq: Four times a day (QID) | ORAL | Status: DC | PRN

## 2017-10-12 MED ORDER — VANCOMYCIN HCL IN DEXTROSE 1-5 GM/200ML-% IV SOLN: 1000.0000 mg | Freq: Once | INTRAVENOUS | Status: DC

## 2017-10-12 MED ORDER — ACETAMINOPHEN 500 MG PO TABS
1000.0000 mg | ORAL_TABLET | Freq: Once | ORAL | Status: AC
  Administered 2017-10-12: 1000 mg via ORAL
  Filled 2017-10-12: qty 2

## 2017-10-12 MED ORDER — VANCOMYCIN HCL 10 G IV SOLR
1500.0000 mg | Freq: Once | INTRAVENOUS | Status: AC
  Administered 2017-10-12: 1500 mg via INTRAVENOUS
  Filled 2017-10-12: qty 1500

## 2017-10-12 MED ORDER — SODIUM CHLORIDE 0.9 % IV BOLUS (SEPSIS): 250.0000 mL | Freq: Once | INTRAVENOUS | Status: DC

## 2017-10-12 MED ORDER — IRBESARTAN 150 MG PO TABS
75.0000 mg | ORAL_TABLET | Freq: Every day | ORAL | Status: DC
  Administered 2017-10-12 – 2017-10-17 (×6): 75 mg via ORAL
  Filled 2017-10-12 (×7): qty 1

## 2017-10-12 MED ORDER — PIPERACILLIN-TAZOBACTAM 3.375 G IVPB
3.3750 g | Freq: Three times a day (TID) | INTRAVENOUS | Status: DC
  Administered 2017-10-12 – 2017-10-14 (×6): 3.375 g via INTRAVENOUS
  Filled 2017-10-12 (×6): qty 50

## 2017-10-12 MED ORDER — MORPHINE SULFATE (PF) 4 MG/ML IV SOLN
4.0000 mg | Freq: Once | INTRAVENOUS | Status: AC
  Administered 2017-10-12: 4 mg via INTRAVENOUS
  Filled 2017-10-12: qty 1

## 2017-10-12 MED ORDER — ACETAMINOPHEN 325 MG PO TABS
650.0000 mg | ORAL_TABLET | Freq: Four times a day (QID) | ORAL | Status: DC | PRN
  Administered 2017-10-13: 650 mg via ORAL
  Filled 2017-10-12: qty 2

## 2017-10-12 MED ORDER — TECHNETIUM TC 99M MEBROFENIN IV KIT
5.4000 | PACK | Freq: Once | INTRAVENOUS | Status: AC | PRN
  Administered 2017-10-12: 5.4 via INTRAVENOUS

## 2017-10-12 MED ORDER — ONDANSETRON HCL 4 MG/2ML IJ SOLN
4.0000 mg | Freq: Once | INTRAMUSCULAR | Status: AC
  Administered 2017-10-12: 4 mg via INTRAVENOUS
  Filled 2017-10-12: qty 2

## 2017-10-12 MED ORDER — PIPERACILLIN-TAZOBACTAM 3.375 G IVPB 30 MIN
3.3750 g | Freq: Once | INTRAVENOUS | Status: AC
  Administered 2017-10-12: 3.375 g via INTRAVENOUS
  Filled 2017-10-12: qty 50

## 2017-10-12 MED ORDER — ACETAMINOPHEN 650 MG RE SUPP: 650.0000 mg | Freq: Four times a day (QID) | RECTAL | Status: DC | PRN

## 2017-10-12 MED ORDER — INSULIN ASPART 100 UNIT/ML ~~LOC~~ SOLN: 0.0000 [IU] | SUBCUTANEOUS | Status: DC

## 2017-10-12 MED ORDER — DEXTROSE 50 % IV SOLN
25.0000 mL | Freq: Once | INTRAVENOUS | Status: AC
  Administered 2017-10-12: 25 mL via INTRAVENOUS
  Filled 2017-10-12: qty 50

## 2017-10-12 MED ORDER — SODIUM CHLORIDE 0.9 % IV BOLUS (SEPSIS): 1000.0000 mL | Freq: Once | INTRAVENOUS | Status: DC

## 2017-10-12 NOTE — Progress Notes (Signed)
Hypoglycemic Event  CBG: 65  Treatment: 30mL D50 I.V.   Symptoms: Asymptomatic  Follow-up CBG: Time: 2045 CBG Result:137  Possible Reasons for Event: Inadequate meal intake  Comments/MD notified:Bodenheimer NP     Lauralyn Primes

## 2017-10-12 NOTE — ED Notes (Signed)
Unable to get second blood cultures. Antibiotics started.

## 2017-10-12 NOTE — ED Notes (Signed)
Bed: OM35 Expected date:  Expected time:  Means of arrival:  Comments: Poss Sepsis

## 2017-10-12 NOTE — ED Notes (Addendum)
I have just given report to the nurse on [address]--will transport shortly. She remains in no distress and she has just received IV pain med. Per her request. Also, Dr. Barry Dienes has just seen her.

## 2017-10-12 NOTE — H&P (Signed)
History and Physical    Debbie Martinez ZOX:[id number] DOB: [birth year] DOA: 10/12/2017  PCP: Lucianne Lei, MD  Patient coming from: Home  Chief Complaint: Nausea, vomiting, RUQ abdominal pain   HPI: Debbie Martinez is a 42 y.o. female with medical history significant of breast cancer with metastases, diabetes, pulmonary embolism on Xarelto with 2-day history of nausea, vomiting, right upper quadrant abdominal pain.  She also admits to right-sided chest pain that radiates from her abdomen, worse with cough.  She describes her abdominal pain as a squeezing constant pain.  She has vomited about 3 times so far.  She also admits to fevers and chills, she was febrile at 101.9 in the emergency department.  She admits to some dysuria, was diagnosed with a UTI as an outpatient and was started on Cipro.  Her dysuria has been improving.  She also admits to some diarrhea.  ED Course: In the emergency department, she was febrile 101.9, tachycardic 145, tachypneic 28.  She was satting 96% on room air.  Labs revealed elevated liver enzymes AST 69, ALT 80, total bilirubin 1.5.  Lactic acid 1.9.  WBC 7.3.  INR 4.23.  Chest x-ray without acute cardiopulmonary disease.  Right upper quadrant ultrasound showed mild gallbladder sludge, mild edematous gallbladder wall thickening.  General surgery was consulted for concern for cholecystitis.  Review of Systems: As per HPI otherwise 10 point review of systems negative.   Past Medical History:  Diagnosis Date  . Anxiety    gets sweaty and short of breath  . Breast cancer ([place]) 01/20/14   triple positive  . Diabetes   . GERD (gastroesophageal reflux disease)    does not take anything  . History of radiation therapy 11/03/14- 12/14/14   Right Breast, supraclavicular nodes, axillary nodes, and internal mammary nodes.  . History of radiation therapy 01/04/16- 01/23/16   Spine metastasis L2-S2  . Hypertension   . Liver metastases ([place])   . Metastatic cancer to  bone ([place])   . Shortness of breath    'anxiety'    Past Surgical History:  Procedure Laterality Date  . BREAST LUMPECTOMY WITH NEEDLE LOCALIZATION AND AXILLARY SENTINEL LYMPH NODE BX Right 08/23/2014   Procedure: RIGHT BREAST LUMPECTOMY WITH NEEDLE LOCALIZATION(X'S 2)AND RIGHT AXILLARY SENTINEL LYMPH NODE Bi[facility]ies;  Surgeon: Autumn Messing III, MD;  Location: [facility];  Service: General;  Laterality: Right;  . CESAREAN SECTION     2008  . KELOID EXCISION Left    ear  . PORTACATH PLACEMENT Left 01/26/2014   Procedure: INSERTION PORT-A-CATH;  Surgeon: Autumn Messing III, MD;  Location: [place];  Service: General;  Laterality: Left;  . RE-EXCISION OF BREAST CANCER,SUPERIOR MARGINS Right 09/15/2014   Procedure: RE-EXCISION OF RIGHT BREAST CANCER,SUPERIOR AND INFERIOR MARGINS;  Surgeon: Autumn Messing III, MD;  Location: [facility];  Service: General;  Laterality: Right;     reports that she has never smoked. She has never used smokeless tobacco. She reports that she drinks alcohol. She reports that she does not use drugs.  Allergies  Allergen Reactions  . Lisinopril Swelling and Cough    Swelling under eyes and eyelids    . Tamoxifen Itching    Family History  Problem Relation Age of Onset  . Heart attack Father 25  . Lupus Maternal Aunt   . Prostate cancer Maternal Grandfather 63  . Dementia Paternal Grandmother   . Leukemia Cousin 67       paternal first cousin    Prior  to Admission medications   Medication Sig Start Date End Date Taking? Authorizing Provider  acetaminophen (TYLENOL) 500 MG tablet Take 1,000 mg by mouth every 4 (four) hours as needed for moderate pain.   Yes [provider]  Cholecalciferol (VITAMIN D-3 PO) Take 1 capsule by mouth daily.   Yes [provider]  ciprofloxacin (CIPRO) 250 MG tablet Take 250 mg by mouth 2 (two) times daily. 10/07/17  Yes [provider]  ENSURE (ENSURE) Take 237 mLs by mouth 2 (two) times daily between meals.     Yes [provider]  fentaNYL (DURAGESIC - DOSED MCG/HR) 25 MCG/HR patch Place 1 patch (25 mcg total) onto the skin every 3 (three) days. 09/10/17  Yes Tanner, Lyndon Code., PA-C  fluticasone (FLONASE) 50 MCG/ACT nasal spray Place 2 sprays into both nostrils 2 (two) times daily as needed for allergies. 08/05/17  Yes [provider]  Fluticasone Furoate-Vilanterol (BREO ELLIPTA IN) Inhale 1 puff into the lungs daily as needed (Shortness of breath).   Yes [provider]  gabapentin (NEURONTIN) 100 MG capsule TAKE 1 CAPSULE BY MOUTH THREE TIMES A DAY 10/03/17  Yes [provider]  IRON PO Take 1 tablet by mouth 2 (two) times daily.   Yes [provider]  JANUMET 50-500 MG tablet Take 1 tablet by mouth daily. 07/10/17  Yes [provider]  lidocaine-prilocaine (EMLA) cream Apply to affected area once 09/03/17  Yes Gudena, Loleta Dicker, MD  Lidocaine-Prilocaine, Bulk, 2.5-2.5 % CREA Apply 5 mLs topically as needed. 2 hours before use, cover with plawtic wrap. 05/16/17  Yes [name], MD  LORazepam (ATIVAN) 0.5 MG tablet Take 1 tablet (0.5 mg total) by mouth at bedtime as needed (Nausea or vomiting). 09/03/17  Yes [name], MD  Multiple Vitamins-Minerals (MULTIVITAMIN ADULT) TABS Take 1 tablet by mouth daily.   Yes [provider]  omeprazole (PRILOSEC) 20 MG capsule Take 1 capsule (20 mg total) by mouth daily. Take while using dexamethasone to prevent heartburn. Patient taking differently: Take 20 mg by mouth daily as needed (Heartburn).  01/09/16  Yes [name], MD  ondansetron (ZOFRAN) 8 MG tablet Take 1 tablet (8 mg total) by mouth 2 (two) times daily as needed for refractory nausea / vomiting. Start on day 3 after chemo. 09/03/17  Yes [name], MD  polyethylene glycol (MIRALAX / GLYCOLAX) packet Take 17 g by mouth daily. Patient taking differently: Take 17 g by mouth daily as needed for mild constipation.  01/05/16  Yes Charlynne Cousins, MD   prochlorperazine (COMPAZINE) 10 MG tablet Take 1 tablet (10 mg total) by mouth every 6 (six) hours as needed (Nausea or vomiting). 09/03/17  Yes [name], MD  rivaroxaban (XARELTO) 20 MG TABS tablet Take 1 tablet (20 mg total) by mouth daily with supper. 09/26/17  Yes Debbe Odea, MD  valsartan (DIOVAN) 80 MG tablet Take 1 tablet (80 mg total) by mouth daily. 09/08/17  Yes Debbe Odea, MD  ACCU-CHEK AVIVA PLUS test strip USE AS DIRECTED 4 TIMES A DAY 12/27/16   [name], MD  ACCU-CHEK SOFTCLIX LANCETS lancets 100 each by Other route 4 (four) times daily. Use as instructed 11/12/16   [name], MD  azithromycin (ZITHROMAX) 250 MG tablet Take 1 tablet (250 mg total) by mouth daily. Patient not taking: Reported on 10/12/2017 09/07/17   Debbe Odea, MD  blood glucose meter kit and supplies KIT Dispense based on patient and insurance preference. Use up to four times daily  as directed. (FOR ICD-9 250.00, 250.01). 01/12/16   [name], MD  fluconazole (DIFLUCAN) 100 MG tablet Take 1 tablet (100 mg total) by mouth daily. Patient not taking: Reported on 10/12/2017 09/17/17   [name], MD  gabapentin (NEURONTIN) 300 MG capsule Take 1 capsule (300 mg total) by mouth 3 (three) times daily. Patient not taking: Reported on 10/12/2017 09/10/17   Harle Stanford., PA-C  oxyCODONE-acetaminophen (PERCOCET/ROXICET) 5-325 MG tablet Take 1-2 tablets by mouth every 6 (six) hours as needed for severe pain. 10/07/17   [name], MD  Rivaroxaban (XARELTO) 15 MG TABS tablet Take 1 tablet (15 mg total) by mouth 2 (two) times daily with a meal. Patient not taking: Reported on 10/12/2017 09/07/17   Debbe Odea, MD  traMADol (ULTRAM) 50 MG tablet Take 1 tablet (50 mg total) by mouth every 6 (six) hours as needed. Patient taking differently: Take 50 mg by mouth every 6 (six) hours as needed for moderate pain.  09/10/17   Harle Stanford., PA-C    Physical Exam: Vitals:   10/12/17 0900 10/12/17 0930 10/12/17  1000 10/12/17 1030  BP: 126/73 121/70 (!) 108/91 124/90  Pulse: (!) 128 (!) 123 (!) 124 (!) 123  Resp: (!) 37 (!) 36 (!) 25 (!) 32  Temp:      TempSrc:      SpO2: 95% 97% 96% 96%  Weight:      Height:        Constitutional: NAD, calm, comfortable Eyes: PERRL, lids and conjunctivae normal ENMT: Mucous membranes are moist. Posterior pharynx clear of any exudate or lesions.Normal dentition.  Neck: normal, supple, no masses, no thyromegaly Respiratory: clear to auscultation bilaterally, no wheezing, no crackles. Normal respiratory effort. No accessory muscle use.  Cardiovascular: Tachycardic rate, regular rhythm, no murmurs / rubs / gallops. No extremity edema.  Abdomen: Tender to palpation right upper quadrant, soft, bowel sounds present Musculoskeletal: no clubbing / cyanosis. No joint deformity upper and lower extremities. Good ROM, no contractures. Normal muscle tone.  Skin: no rashes, lesions, ulcers. No induration Neurologic: CN 2-12 grossly intact.  Nonfocal exam.  Speech clear.  Strength 5/5 in all 4.  Psychiatric: Normal judgment and insight. Alert and oriented x 3. Normal mood.   Labs on Admission: I have personally reviewed following labs and imaging studies  CBC: Recent Labs  Lab 10/12/17 0603  WBC 7.3  NEUTROABS 5.4  HGB 7.4*  HCT 23.7*  MCV 81.2  PLT 379   Basic Metabolic Panel: Recent Labs  Lab 10/12/17 0603  NA 134*  K 4.5  CL 99*  CO2 21*  GLUCOSE 125*  BUN 12  CREATININE 0.51  CALCIUM 8.5*   GFR: Estimated Creatinine Clearance: 90 mL/min (by C-G formula based on SCr of 0.51 mg/dL). Liver Function Tests: Recent Labs  Lab 10/12/17 0603  AST 69*  ALT 80*  ALKPHOS 205*  BILITOT 1.5*  PROT 7.8  ALBUMIN 2.7*   No results for input(s): LIPASE, AMYLASE in the last 168 hours. No results for input(s): AMMONIA in the last 168 hours. Coagulation Profile: Recent Labs  Lab 10/12/17 0603  INR 4.23*   Cardiac Enzymes: No results for input(s):  CKTOTAL, CKMB, CKMBINDEX, TROPONINI in the last 168 hours. BNP (last 3 results) No results for input(s): PROBNP in the last 8760 hours. HbA1C: No results for input(s): HGBA1C in the last 72 hours. CBG: No results for input(s): GLUCAP in the last 168 hours. Lipid Profile: No results for input(s): CHOL, HDL, LDLCALC,  TRIG, CHOLHDL, LDLDIRECT in the last 72 hours. Thyroid Function Tests: No results for input(s): TSH, T4TOTAL, FREET4, T3FREE, THYROIDAB in the last 72 hours. Anemia Panel: No results for input(s): VITAMINB12, FOLATE, FERRITIN, TIBC, IRON, RETICCTPCT in the last 72 hours. Urine analysis:    Component Value Date/Time   COLORURINE YELLOW 10/12/2017 0603   APPEARANCEUR HAZY (A) 10/12/2017 0603   LABSPEC 1.008 10/12/2017 0603   PHURINE 5.0 10/12/2017 0603   GLUCOSEU NEGATIVE 10/12/2017 0603   HGBUR MODERATE (A) 10/12/2017 0603   BILIRUBINUR NEGATIVE 10/12/2017 0603   KETONESUR 20 (A) 10/12/2017 0603   PROTEINUR NEGATIVE 10/12/2017 0603   NITRITE NEGATIVE 10/12/2017 0603   LEUKOCYTESUR MODERATE (A) 10/12/2017 0603   Sepsis Labs: !!!!!!!!!!!!!!!!!!!!!!!!!!!!!!!!!!!!!!!!!!!! '@LABRCNTIP' (procalcitonin:4,lacticidven:4) )No results found for this or any previous visit (from the past 240 hour(s)).   Radiological Exams on Admission: Dg Chest Portable 1 View  Result Date: 10/12/2017 CLINICAL DATA:  Nausea, vomiting and diarrhea beginning yesterday. History of breast cancer. Patient on ciprofloxacin for UTI over the past week. EXAM: PORTABLE CHEST 1 VIEW COMPARISON:  09/04/2017 FINDINGS: Left subclavian Port-A-Cath has tip just below the cavoatrial junction. Lungs are hypoinflated without focal airspace consolidation or effusion. Cardiomediastinal silhouette and remainder of the exam is unchanged. IMPRESSION: Hypoinflation without acute cardiopulmonary disease. Electronically Signed   By: Marin Olp M.D.   On: 10/12/2017 07:13   US Abdomen Limited Ruq  Result Date:  10/12/2017 CLINICAL DATA:  Chest pain with nausea and vomiting 1 week. Patient [redacted] weeks pregnant. Metastatic breast cancer. EXAM: ULTRASOUND ABDOMEN LIMITED RIGHT UPPER QUADRANT COMPARISON:  CT 09/02/2017 FINDINGS: Gallbladder: There is a mild amount of gallbladder sludge present. No definite gallstones. There is mild edematous gallbladder wall thickening measuring 5.9 mm. Negative sonographic Murphy's sign. No adjacent free fluid. Common bile duct: Diameter: 4.3 mm. Liver: Mild heterogeneous parenchymal pattern with 6.1 cm oval hypoechoic mass likely representing patient's known metastatic liver disease. Portal vein is patent on color Doppler imaging with normal direction of blood flow towards the liver. IMPRESSION: Mild gallbladder sludge with mild edematous gallbladder wall thickening. Recommend clinical correlation for acute cholecystitis. Heterogeneous hepatic parenchymal pattern with 6.1 cm hypoechoic mass present. This is likely due to patient's known metastatic breast cancer. Electronically Signed   By: Marin Olp M.D.   On: 10/12/2017 09:55    EKG: Independently reviewed.  Sinus tachycardia, no significant ST change  Assessment/Plan Principal Problem:   Acute cholecystitis Active Problems:   Breast cancer of upper-inner quadrant of right female breast ([facility])   Hypertension   Bone metastasis ([facility])   Cancer associated pain   Diabetes ([facility])   Acute cholecystitis -Right upper quadrant ultrasound revealed mild gallbladder sludge, mild edematous gallbladder wall thickening. -General surgery consulted  -Will start IV zosyn in setting of fever, tachycardia  -Blood cultures pending -IVF   Metastatic breast cancer to liver, bone  -Followed by Dr. Lindi Adie  Recent dx PE in May 2019 -Hold Xarelto pending surgical consultation  HTN -Continue ARB   DM  -SSI    DVT prophylaxis: Xarelto on hold depending on surgical recommendation. SCD ordered  Code Status: Full  Family Communication:  At bedside  Disposition Plan: Pending surgical evaluation, clinical improvement, suspect patient to return back to home setting on discharge  Consults called: General surgery consulted by EDP  Admission status: Inpatient   * I certify that at the point of admission it is my clinical judgment that the patient will require inpatient hospital care spanning beyond 2 midnights  from the point of admission due to high intensity of service, high risk for further deterioration and high frequency of surveillance required.*   Dessa Phi, DO [facility] [email or web address] Password [facility] 10/12/2017, 10:52 AM

## 2017-10-12 NOTE — ED Triage Notes (Signed)
Per EMS: pt coming from home c/o of N/D/V that started yesterday. Hx of breast cancer. Reports being on ciprofloxacin the past week for UTI. Reports being unable to control cancer pain due to not being able to keep down pain medication.   300 mL of NS given by EMS  20g in L forearm   RIGHT SIDE RESTRICTED

## 2017-10-12 NOTE — ED Notes (Signed)
Her mom and sister are visiting with her. She has just had her abd. U/s. And continues to be in no distress.

## 2017-10-12 NOTE — ED Notes (Signed)
ED TO INPATIENT HANDOFF REPORT  Name/Age/Gender Debbie Martinez 42 y.o. female  Code Status Code Status History    Date Active Date Inactive Code Status Order ID Comments User Context   09/04/2017 1739 09/07/2017 1540 Full Code [id number]  Cristy Folks, MD Inpatient   12/31/2015 2253 01/02/2016 1100 Full Code [id number]  Ivor Costa, MD ED      Home/SNF/Other Home  Chief Complaint nausea  Level of Care/Admitting Diagnosis ED Disposition    ED Disposition Condition [facility]: [facility] [[id number]]  Level of Care: Telemetry [5]  Admit to tele based on following criteria: Complex arrhythmia (Bradycardia/Tachycardia)  Diagnosis: Acute cholecystitis [575.0.ICD-9-CM]  Admitting Physician: Dessa Phi [[id number]]  Attending Physician: Dessa Phi [phone]  Estimated length of stay: past midnight tomorrow  Certification:: I certify this patient will need inpatient services for at least 2 midnights  PT Class (Do Not Modify): Inpatient [101]  PT Acc Code (Do Not Modify): Private [1]       Medical History Past Medical History:  Diagnosis Date  . Anxiety    gets sweaty and short of breath  . Breast cancer ([place]) 01/20/14   triple positive  . Diabetes   . GERD (gastroesophageal reflux disease)    does not take anything  . History of radiation therapy 11/03/14- 12/14/14   Right Breast, supraclavicular nodes, axillary nodes, and internal mammary nodes.  . History of radiation therapy 01/04/16- 01/23/16   Spine metastasis L2-S2  . Hypertension   . Liver metastases ([place])   . Metastatic cancer to bone ([place])   . Shortness of breath    'anxiety'    Allergies Allergies  Allergen Reactions  . Lisinopril Swelling and Cough    Swelling under eyes and eyelids    . Tamoxifen Itching    IV Location/Drains/Wounds Patient Lines/Drains/Airways Status   Active Line/Drains/Airways    Name:   Placement date:   Placement time:   Site:   Days:    Implanted Port 01/27/16 Left Chest   01/27/16    1045    Chest   624   Peripheral IV 10/12/17 Left Forearm   10/12/17    0547    Forearm   less than 1   Incision (Closed) 01/26/14 Neck Left   01/26/14    0943     1355   Incision (Closed) 08/23/14 Breast Right   08/23/14    1201     1146   Incision (Closed) 09/15/14 Breast Right   09/15/14    1159     1123          Labs/Imaging Results for orders placed or performed during the hospital encounter of 10/12/17 (from the past 48 hour(s))  Comprehensive metabolic panel     Status: Abnormal   Collection Time: 10/12/17  6:03 AM  Result Value Ref Range   Sodium 134 (L) 135 - 145 mmol/L   Potassium 4.5 3.5 - 5.1 mmol/L   Chloride 99 (L) 101 - 111 mmol/L   CO2 21 (L) 22 - 32 mmol/L   Glucose, Bld 125 (H) 65 - 99 mg/dL   BUN 12 6 - 20 mg/dL   Creatinine, Ser 0.51 0.44 - 1.00 mg/dL   Calcium 8.5 (L) 8.9 - 10.3 mg/dL   Total Protein 7.8 6.5 - 8.1 g/dL   Albumin 2.7 (L) 3.5 - 5.0 g/dL   AST 69 (H) 15 - 41 U/L   ALT 80 (H) 14 -  54 U/L   Alkaline Phosphatase 205 (H) 38 - 126 U/L   Total Bilirubin 1.5 (H) 0.3 - 1.2 mg/dL   GFR calc non Af Amer >60 >60 mL/min   GFR calc Af Amer >60 >60 mL/min    Comment: (NOTE) The eGFR has been calculated using the CKD EPI equation. This calculation has not been validated in all clinical situations. eGFR's persistently <60 mL/min signify possible Chronic Kidney Disease.    Anion gap 14 5 - 15    Comment: Performed at [facility], [place] [address] [place], [place] [address]  CBC with Differential     Status: Abnormal   Collection Time: 10/12/17  6:03 AM  Result Value Ref Range   WBC 7.3 4.0 - 10.5 K/uL   RBC 2.92 (L) 3.87 - 5.11 MIL/uL   Hemoglobin 7.4 (L) 12.0 - 15.0 g/dL   HCT 23.7 (L) 36.0 - 46.0 %   MCV 81.2 78.0 - 100.0 fL   MCH 25.3 (L) 26.0 - 34.0 pg   MCHC 31.2 30.0 - 36.0 g/dL   RDW 20.6 (H) 11.5 - 15.5 %   Platelets 196 150 - 400 K/uL   Neutrophils Relative % 73 %    Lymphocytes Relative 14 %   Monocytes Relative 13 %   Eosinophils Relative 0 %   Basophils Relative 0 %   Neutro Abs 5.4 1.7 - 7.7 K/uL   Lymphs Abs 1.0 0.7 - 4.0 K/uL   Monocytes Absolute 0.9 0.1 - 1.0 K/uL   Eosinophils Absolute 0.0 0.0 - 0.7 K/uL   Basophils Absolute 0.0 0.0 - 0.1 K/uL   RBC Morphology POLYCHROMASIA PRESENT     Comment: Performed at [facility], [place] [address], [place], [place] [address]  Protime-INR     Status: Abnormal   Collection Time: 10/12/17  6:03 AM  Result Value Ref Range   Prothrombin Time 40.4 (H) 11.4 - 15.2 seconds   INR 4.23 (HH)     Comment: CRITICAL RESULT CALLED TO, READ BACK BY AND VERIFIED WITH: Alinda Sierras [id number] '@[id number]'$  BY V.WILKINS Performed at [facility], [place] [address] [place], [place] [address]   Urinalysis, Routine w reflex microscopic     Status: Abnormal   Collection Time: 10/12/17  6:03 AM  Result Value Ref Range   Color, Urine YELLOW YELLOW   APPearance HAZY (A) CLEAR   Specific Gravity, Urine 1.008 1.005 - 1.030   pH 5.0 5.0 - 8.0   Glucose, UA NEGATIVE NEGATIVE mg/dL   Hgb urine dipstick MODERATE (A) NEGATIVE   Bilirubin Urine NEGATIVE NEGATIVE   Ketones, ur 20 (A) NEGATIVE mg/dL   Protein, ur NEGATIVE NEGATIVE mg/dL   Nitrite NEGATIVE NEGATIVE   Leukocytes, UA MODERATE (A) NEGATIVE   RBC / HPF 0-5 0 - 5 RBC/hpf   WBC, UA 6-10 0 - 5 WBC/hpf   Bacteria, UA FEW (A) NONE SEEN   Squamous Epithelial / LPF 0-5 0 - 5    Comment: Performed at [facility], [place] [address], [place], [place] [address]  Lactic acid, plasma     Status: None   Collection Time: 10/12/17  6:03 AM  Result Value Ref Range   Lactic Acid, Venous 1.9 0.5 - 1.9 mmol/L    Comment: Performed at [facility], [place] [address], [place], [place] [address]  I-Stat beta hCG blood, ED     Status: None   Collection Time: 10/12/17  6:48 AM  Result Value  Ref Range   I-stat hCG, quantitative  <5.0 <5 mIU/mL   Comment 3            Comment:   GEST. AGE      CONC.  (mIU/mL)   <=1 WEEK        5 - 50     2 WEEKS       50 - 500     3 WEEKS       100 - 10,000     4 WEEKS     1,000 - 30,000        FEMALE AND NON-PREGNANT FEMALE:     LESS THAN 5 mIU/mL   I-Stat CG4 Lactic Acid, ED     Status: Abnormal   Collection Time: 10/12/17  6:49 AM  Result Value Ref Range   Lactic Acid, Venous 1.97 (H) 0.5 - 1.9 mmol/L  I-Stat Troponin, ED (not at [facility])     Status: None   Collection Time: 10/12/17  7:31 AM  Result Value Ref Range   Troponin i, poc 0.01 0.00 - 0.08 ng/mL   Comment 3            Comment: Due to the release kinetics of cTnI, a negative result within the first hours of the onset of symptoms does not rule out myocardial infarction with certainty. If myocardial infarction is still suspected, repeat the test at appropriate intervals.   I-Stat CG4 Lactic Acid, ED     Status: None   Collection Time: 10/12/17  9:08 AM  Result Value Ref Range   Lactic Acid, Venous 1.60 0.5 - 1.9 mmol/L   Dg Chest Portable 1 View  Result Date: 10/12/2017 CLINICAL DATA:  Nausea, vomiting and diarrhea beginning yesterday. History of breast cancer. Patient on ciprofloxacin for UTI over the past week. EXAM: PORTABLE CHEST 1 VIEW COMPARISON:  09/04/2017 FINDINGS: Left subclavian Port-A-Cath has tip just below the cavoatrial junction. Lungs are hypoinflated without focal airspace consolidation or effusion. Cardiomediastinal silhouette and remainder of the exam is unchanged. IMPRESSION: Hypoinflation without acute cardiopulmonary disease. Electronically Signed   By: Marin Olp M.D.   On: 10/12/2017 07:13   US Abdomen Limited Ruq  Result Date: 10/12/2017 CLINICAL DATA:  Chest pain with nausea and vomiting 1 week. Patient [redacted] weeks pregnant. Metastatic breast cancer. EXAM: ULTRASOUND ABDOMEN LIMITED RIGHT UPPER QUADRANT COMPARISON:  CT 09/02/2017 FINDINGS: Gallbladder: There is a mild amount of gallbladder  sludge present. No definite gallstones. There is mild edematous gallbladder wall thickening measuring 5.9 mm. Negative sonographic Murphy's sign. No adjacent free fluid. Common bile duct: Diameter: 4.3 mm. Liver: Mild heterogeneous parenchymal pattern with 6.1 cm oval hypoechoic mass likely representing patient's known metastatic liver disease. Portal vein is patent on color Doppler imaging with normal direction of blood flow towards the liver. IMPRESSION: Mild gallbladder sludge with mild edematous gallbladder wall thickening. Recommend clinical correlation for acute cholecystitis. Heterogeneous hepatic parenchymal pattern with 6.1 cm hypoechoic mass present. This is likely due to patient's known metastatic breast cancer. Electronically Signed   By: Marin Olp M.D.   On: 10/12/2017 09:55    Pending Labs Unresulted Labs (From admission, onward)   Start     Ordered   10/12/17 0645  Lipase, blood  STAT,   STAT     10/12/17 0644   10/12/17 0643  C difficile quick scan w PCR reflex  (C Difficile quick screen w PCR reflex panel)  Once, for 24 hours,   R  10/12/17 0644   10/12/17 8032  Blood Culture (routine x 2)  BLOOD CULTURE X 2,   STAT     10/12/17 0644   10/12/17 0557  Culture, blood (Routine x 2)  BLOOD CULTURE X 2,   STAT     10/12/17 0557   10/12/17 0557  Urine culture  STAT,   STAT     10/12/17 0557   Signed and Held  CBC  Tomorrow morning,   R     Signed and Held   Signed and Held  Comprehensive metabolic panel  Tomorrow morning,   R     Signed and Held   Signed and Held  Protime-INR  Tomorrow morning,   R     Signed and Held      Vitals/Pain Today's Vitals   10/12/17 1030 10/12/17 1100 10/12/17 1145 10/12/17 1151  BP: 124/90 123/81    Pulse: (!) 123  (!) 118   Resp: (!) 32 (!) 37 (!) 35   Temp:      TempSrc:      SpO2: 96%  95%   Weight:      Height:      PainSc:    7     Isolation Precautions Enteric precautions (UV disinfection)  Medications Medications   piperacillin-tazobactam (ZOSYN) IVPB 3.375 g (has no administration in time range)  sodium chloride 0.9 % bolus 2,109 mL (0 mLs Intravenous Stopped 10/12/17 0854)  ondansetron (ZOFRAN) injection 4 mg (4 mg Intravenous Given 10/12/17 0656)  piperacillin-tazobactam (ZOSYN) IVPB 3.375 g (0 g Intravenous Stopped 10/12/17 0740)  acetaminophen (TYLENOL) tablet 1,000 mg (1,000 mg Oral Given 10/12/17 0658)  vancomycin (VANCOCIN) 1,500 mg in sodium chloride 0.9 % 500 mL IVPB (0 mg Intravenous Stopped 10/12/17 1000)  morphine 4 MG/ML injection 4 mg (4 mg Intravenous Given 10/12/17 0706)  morphine 4 MG/ML injection 4 mg (4 mg Intravenous Given 10/12/17 1151)    Mobility walks

## 2017-10-12 NOTE — Progress Notes (Signed)
A consult was received from an ED physician for Vancomycin and Zosyn  per pharmacy dosing.  The patient's profile has been reviewed for ht/wt/allergies/indication/available labs.   A one time order has been placed for Vancomycin 1.5gm iv x1, and Zosyn 3.375gm iv x1.  Further antibiotics/pharmacy consults should be ordered by admitting physician if indicated.                       Thank you, Nani Skillern Crowford 10/12/2017  6:51 AM

## 2017-10-12 NOTE — ED Provider Notes (Signed)
[facility] DEPT Provider Note   CSN: [id number] Arrival date & time: 10/12/17  0541     History   Chief Complaint Chief Complaint  Patient presents with  . N/V/D    HPI Debbie Martinez is a 42 y.o. female.  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  Patient has had 3 episodes of vomiting and 2 episodes of diarrhea.  Nonbloody nonbilious emesis and nonbloody diarrhea.  States she also started having some left-sided abdominal pain which is now located more in the right upper quadrant.  Also has right lower chest pain, dyspnea, a cough, and palpitations.  States that her symptoms feel similar to when she was  diagnosed with blood clot last month.  States she was sent home on Xarelto after being dx with a blood clot. she denies any missed doses.  Her last dose was yesterday and she states she is not due for another dose until dinnertime today.  States her last dose of chemotherapy was 3 weeks ago.  Was recently diagnosed with UTI and started on Cipro last week. Reports bilat flank pain, worse on right. Had been having some dysuria which has improved on abx.  Past Medical History:  Diagnosis Date  . Anxiety    gets sweaty and short of breath  . Breast cancer ([place]) 01/20/14   triple positive  . Diabetes   . GERD (gastroesophageal reflux disease)    does not take anything  . History of radiation therapy 11/03/14- 12/14/14   Right Breast, supraclavicular nodes, axillary nodes, and internal mammary nodes.  . History of radiation therapy 01/04/16- 01/23/16   Spine metastasis L2-S2  . Hypertension   . Liver metastases ([place])   . Metastatic cancer to bone ([place])   . Shortness of breath    'anxiety'    Patient Active Problem  List   Diagnosis Date Noted  . Acute cholecystitis 10/12/2017  . Pulmonary embolism ([place]) 09/04/2017  . Port catheter in place 12/25/2016  . Diabetes ([place]) 11/12/2016  . Goals of care, counseling/discussion 02/13/2016  . Cancer associated pain 01/27/2016  . Cord compression ([place]) 01/14/2016  . Bone metastasis ([place]) 01/14/2016  . Breast carcinoma metastatic to multiple sites ([place]) 12/31/2015  . Back pain 12/31/2015  . Hypertension 12/31/2015  . Anxiety 12/31/2015  . Genetic testing 02/25/2015  . Chemotherapy induced nausea and vomiting 03/25/2014  . 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 Constipation 03/25/2014  . Breast cancer of upper-inner quadrant of right female breast ([place]) 01/15/2014    Past Surgical History:  Procedure Laterality Date  . BREAST LUMPECTOMY WITH NEEDLE LOCALIZATION AND AXILLARY SENTINEL LYMPH NODE BX Right 08/23/2014   Procedure: RIGHT BREAST LUMPECTOMY WITH NEEDLE LOCALIZATION(X'S 2)AND RIGHT AXILLARY SENTINEL LYMPH NODE Biopies;  Surgeon: Autumn Messing III, MD;  Location: [facility];  Service: General;  Laterality: Right;  . CESAREAN SECTION     2008  . KELOID EXCISION Left    ear  . PORTACATH PLACEMENT Left 01/26/2014   Procedure: INSERTION PORT-A-CATH;  Surgeon: Autumn Messing III, MD;  Location: [facility];  Service: General;  Laterality: Left;  . RE-EXCISION OF BREAST CANCER,SUPERIOR MARGINS Right 09/15/2014   Procedure: RE-EXCISION OF RIGHT BREAST CANCER,SUPERIOR AND INFERIOR MARGINS;  Surgeon: Autumn Messing  III, MD;  Location: [facility];  Service: General;  Laterality: Right;     OB History   None      Home Medications    Prior to Admission medications   Medication Sig Start Date End Date Taking? Authorizing Provider  acetaminophen (TYLENOL) 500 MG tablet Take 1,000 mg by mouth every 4 (four) hours as needed for moderate pain.   Yes [provider]  Cholecalciferol (VITAMIN D-3 PO) Take 1 capsule by mouth daily.   Yes  [provider]  ciprofloxacin (CIPRO) 250 MG tablet Take 250 mg by mouth 2 (two) times daily. 10/07/17  Yes [provider]  ENSURE (ENSURE) Take 237 mLs by mouth 2 (two) times daily between meals.    Yes [provider]  fentaNYL (DURAGESIC - DOSED MCG/HR) 25 MCG/HR patch Place 1 patch (25 mcg total) onto the skin every 3 (three) days. 09/10/17  Yes Tanner, Lyndon Code., PA-C  fluticasone (FLONASE) 50 MCG/ACT nasal spray Place 2 sprays into both nostrils 2 (two) times daily as needed for allergies. 08/05/17  Yes [provider]  Fluticasone Furoate-Vilanterol (BREO ELLIPTA IN) Inhale 1 puff into the lungs daily as needed (Shortness of breath).   Yes [provider]  gabapentin (NEURONTIN) 100 MG capsule TAKE 1 CAPSULE BY MOUTH THREE TIMES A DAY 10/03/17  Yes [provider]  IRON PO Take 1 tablet by mouth 2 (two) times daily.   Yes [provider]  JANUMET 50-500 MG tablet Take 1 tablet by mouth daily. 07/10/17  Yes [provider]  lidocaine-prilocaine (EMLA) cream Apply to affected area once 09/03/17  Yes Gudena, Loleta Dicker, MD  Lidocaine-Prilocaine, Bulk, 2.5-2.5 % CREA Apply 5 mLs topically as needed. 2 hours before use, cover with plawtic wrap. 05/16/17  Yes [name], MD  LORazepam (ATIVAN) 0.5 MG tablet Take 1 tablet (0.5 mg total) by mouth at bedtime as needed (Nausea or vomiting). 09/03/17  Yes [name], MD  Multiple Vitamins-Minerals (MULTIVITAMIN ADULT) TABS Take 1 tablet by mouth daily.   Yes [provider]  omeprazole (PRILOSEC) 20 MG capsule Take 1 capsule (20 mg total) by mouth daily. Take while using dexamethasone to prevent heartburn. Patient taking differently: Take 20 mg by mouth daily as needed (Heartburn).  01/09/16  Yes [name], MD  ondansetron (ZOFRAN) 8 MG tablet Take 1 tablet (8 mg total) by mouth 2 (two) times daily as needed for refractory nausea / vomiting. Start on day 3 after chemo. 09/03/17   Yes [name], MD  polyethylene glycol (MIRALAX / GLYCOLAX) packet Take 17 g by mouth daily. Patient taking differently: Take 17 g by mouth daily as needed for mild constipation.  01/05/16  Yes Charlynne Cousins, MD  prochlorperazine (COMPAZINE) 10 MG tablet Take 1 tablet (10 mg total) by mouth every 6 (six) hours as needed (Nausea or vomiting). 09/03/17  Yes [name], MD  rivaroxaban (XARELTO) 20 MG TABS tablet Take 1 tablet (20 mg total) by mouth daily with supper. 09/26/17  Yes Debbe Odea, MD  valsartan (DIOVAN) 80 MG tablet Take 1 tablet (80 mg total) by mouth daily. 09/08/17  Yes Debbe Odea, MD  ACCU-CHEK AVIVA PLUS test strip USE AS DIRECTED 4 TIMES A DAY 12/27/16   [name], MD  ACCU-CHEK SOFTCLIX LANCETS lancets 100 each by Other route 4 (four) times daily. Use as instructed 11/12/16   [name], MD  blood glucose meter kit and supplies KIT Dispense based on patient and insurance  preference. Use up to four times daily as directed. (FOR ICD-9 250.00, 250.01). 01/12/16   [name], MD  gabapentin (NEURONTIN) 300 MG capsule Take 1 capsule (300 mg total) by mouth 3 (three) times daily. Patient not taking: Reported on 10/12/2017 09/10/17   Harle Stanford., PA-C  oxyCODONE-acetaminophen (PERCOCET/ROXICET) 5-325 MG tablet Take 1-2 tablets by mouth every 6 (six) hours as needed for severe pain. 10/07/17   [name], MD  Rivaroxaban (XARELTO) 15 MG TABS tablet Take 1 tablet (15 mg total) by mouth 2 (two) times daily with a meal. Patient not taking: Reported on 10/12/2017 09/07/17   Debbe Odea, MD  traMADol (ULTRAM) 50 MG tablet Take 1 tablet (50 mg total) by mouth every 6 (six) hours as needed. Patient taking differently: Take 50 mg by mouth every 6 (six) hours as needed for moderate pain.  09/10/17   Harle Stanford., PA-C    Family History Family History  Problem Relation Age of Onset  . Heart attack Father 45  . Lupus Maternal Aunt   . Prostate cancer Maternal Grandfather  34  . Dementia Paternal Grandmother   . Leukemia Cousin 78       paternal first cousin    Social History Social History   Tobacco Use  . Smoking status: Never Smoker  . Smokeless tobacco: Never Used  Substance Use Topics  . Alcohol use: Yes    Comment: Rarely- maybe  every 6 months  . Drug use: No     Allergies   Lisinopril and Tamoxifen   Review of Systems Review of Systems  Constitutional: Positive for chills and fever.  Eyes: Negative for visual disturbance.  Respiratory: Positive for cough and shortness of breath.   Cardiovascular: Positive for chest pain and palpitations. Negative for leg swelling.  Gastrointestinal: Positive for abdominal pain, diarrhea, nausea and vomiting. Negative for abdominal distention, blood in stool and constipation.  Genitourinary: Negative for frequency and hematuria.  Musculoskeletal: Negative for neck pain.  Skin: Negative for wound.  Neurological: Negative for headaches.   Physical Exam Updated Vital Signs BP 130/87 (BP Location: Left Arm)   Pulse (!) 126   Temp 98.7 F (37.1 C) (Oral)   Resp (!) 32   Ht '5\' 7"'  (1.702 m)   Wt 70.3 kg (155 lb)   SpO2 96%   BMI 24.28 kg/m   Physical Exam  Constitutional: She appears well-developed and well-nourished. She appears distressed.  HENT:  Head: Normocephalic and atraumatic.  Mouth/Throat: Oropharynx is clear and moist.  Eyes: Conjunctivae are normal. No scleral icterus.  Neck: Neck supple.  Cardiovascular: Regular rhythm, normal heart sounds and intact distal pulses.  No murmur heard. tachycardic  Pulmonary/Chest: Effort normal and breath sounds normal. No stridor. No respiratory distress. She has no wheezes.  Tachypneic, decreased air flow bilat  Abdominal: Soft. Bowel sounds are normal. There is no tenderness. Guarding: RUQ.  RUQ TTP with guarding. No rigidity. bilat CVA TTP, worse on the right  Musculoskeletal: She exhibits no edema.  No calf TTP, erythema, or edema    Neurological: She is alert.  Skin: Skin is warm and dry. Capillary refill takes less than 2 seconds.  Psychiatric: She has a normal mood and affect.  Nursing note and vitals reviewed.  ED Treatments / Results  Labs (all labs ordered are listed, but only abnormal results are displayed) Labs Reviewed  COMPREHENSIVE METABOLIC PANEL - Abnormal; Notable for the following components:      Result Value   Sodium  134 (*)    Chloride 99 (*)    CO2 21 (*)    Glucose, Bld 125 (*)    Calcium 8.5 (*)    Albumin 2.7 (*)    AST 69 (*)    ALT 80 (*)    Alkaline Phosphatase 205 (*)    Total Bilirubin 1.5 (*)    All other components within normal limits  CBC WITH DIFFERENTIAL/PLATELET - Abnormal; Notable for the following components:   RBC 2.92 (*)    Hemoglobin 7.4 (*)    HCT 23.7 (*)    MCH 25.3 (*)    RDW 20.6 (*)    All other components within normal limits  PROTIME-INR - Abnormal; Notable for the following components:   Prothrombin Time 40.4 (*)    INR 4.23 (*)    All other components within normal limits  URINALYSIS, ROUTINE W REFLEX MICROSCOPIC - Abnormal; Notable for the following components:   APPearance HAZY (*)    Hgb urine dipstick MODERATE (*)    Ketones, ur 20 (*)    Leukocytes, UA MODERATE (*)    Bacteria, UA FEW (*)    All other components within normal limits  I-STAT CG4 LACTIC ACID, ED - Abnormal; Notable for the following components:   Lactic Acid, Venous 1.97 (*)    All other components within normal limits  CULTURE, BLOOD (ROUTINE X 2)  URINE CULTURE  CULTURE, BLOOD (ROUTINE X 2)  C DIFFICILE QUICK SCREEN W PCR REFLEX  LACTIC ACID, PLASMA  LIPASE, BLOOD  GLUCOSE, CAPILLARY  PREALBUMIN  I-STAT BETA HCG BLOOD, ED (MC, [facility], AP ONLY)  I-STAT BETA HCG BLOOD, ED (MC, [facility], AP ONLY)  I-STAT TROPONIN, ED  I-STAT CG4 LACTIC ACID, ED    EKG EKG Interpretation  Date/Time:  Saturday October 12 2017 08:02:38 EDT Ventricular Rate:  129 PR Interval:    QRS  Duration: 83 QT Interval:  314 QTC Calculation: 460 R Axis:   30 Text Interpretation:  Sinus tachycardia normal except tachycardia, no change from old. Confirmed by Charlesetta Shanks [phone]) on 10/12/2017 8:08:31 AM   Radiology Dg Chest Portable 1 View  Result Date: 10/12/2017 CLINICAL DATA:  Nausea, vomiting and diarrhea beginning yesterday. History of breast cancer. Patient on ciprofloxacin for UTI over the past week. EXAM: PORTABLE CHEST 1 VIEW COMPARISON:  09/04/2017 FINDINGS: Left subclavian Port-A-Cath has tip just below the cavoatrial junction. Lungs are hypoinflated without focal airspace consolidation or effusion. Cardiomediastinal silhouette and remainder of the exam is unchanged. IMPRESSION: Hypoinflation without acute cardiopulmonary disease. Electronically Signed   By: Marin Olp M.D.   On: 10/12/2017 07:13   US Abdomen Limited Ruq  Result Date: 10/12/2017 CLINICAL DATA:  Chest pain with nausea and vomiting 1 week. Patient [redacted] weeks pregnant. Metastatic breast cancer. EXAM: ULTRASOUND ABDOMEN LIMITED RIGHT UPPER QUADRANT COMPARISON:  CT 09/02/2017 FINDINGS: Gallbladder: There is a mild amount of gallbladder sludge present. No definite gallstones. There is mild edematous gallbladder wall thickening measuring 5.9 mm. Negative sonographic Murphy's sign. No adjacent free fluid. Common bile duct: Diameter: 4.3 mm. Liver: Mild heterogeneous parenchymal pattern with 6.1 cm oval hypoechoic mass likely representing patient's known metastatic liver disease. Portal vein is patent on color Doppler imaging with normal direction of blood flow towards the liver. IMPRESSION: Mild gallbladder sludge with mild edematous gallbladder wall thickening. Recommend clinical correlation for acute cholecystitis. Heterogeneous hepatic parenchymal pattern with 6.1 cm hypoechoic mass present. This is likely due to patient's known metastatic breast cancer. Electronically Signed  By: Marin Olp M.D.   On: 10/12/2017  09:55    Procedures Procedures (including critical care time) CRITICAL CARE Performed by: Rodney Booze   Total critical care time: 31 minutes  Critical care time was exclusive of separately billable procedures and treating other patients.  Critical care was necessary to treat or prevent imminent or life-threatening deterioration.  Critical care was time spent personally by me on the following activities: development of treatment plan with patient and/or surrogate as well as nursing, discussions with consultants, evaluation of patient's response to treatment, examination of patient, obtaining history from patient or surrogate, ordering and performing treatments and interventions, ordering and review of laboratory studies, ordering and review of radiographic studies, pulse oximetry and re-evaluation of patient's condition.   Medications Ordered in ED Medications  LORazepam (ATIVAN) tablet 0.5 mg (has no administration in time range)  irbesartan (AVAPRO) tablet 75 mg (75 mg Oral Given 10/12/17 1418)  0.9 %  sodium chloride infusion ( Intravenous New Bag/Given 10/12/17 1234)  acetaminophen (TYLENOL) tablet 650 mg (has no administration in time range)    Or  acetaminophen (TYLENOL) suppository 650 mg (has no administration in time range)  ondansetron (ZOFRAN) tablet 4 mg (has no administration in time range)    Or  ondansetron (ZOFRAN) injection 4 mg (has no administration in time range)  insulin aspart (novoLOG) injection 0-9 Units (0 Units Subcutaneous Not Given 10/12/17 1311)  piperacillin-tazobactam (ZOSYN) IVPB 3.375 g (3.375 g Intravenous New Bag/Given 10/12/17 1311)  ketorolac (TORADOL) 30 MG/ML injection 30 mg (30 mg Intravenous Given 10/12/17 1310)  sodium chloride flush (NS) 0.9 % injection 10-40 mL (has no administration in time range)  sodium chloride flush (NS) 0.9 % injection 10-40 mL (has no administration in time range)  sodium chloride 0.9 % bolus 2,109 mL (0 mLs  Intravenous Stopped 10/12/17 0854)  ondansetron (ZOFRAN) injection 4 mg (4 mg Intravenous Given 10/12/17 0656)  piperacillin-tazobactam (ZOSYN) IVPB 3.375 g (0 g Intravenous Stopped 10/12/17 0740)  acetaminophen (TYLENOL) tablet 1,000 mg (1,000 mg Oral Given 10/12/17 0658)  vancomycin (VANCOCIN) 1,500 mg in sodium chloride 0.9 % 500 mL IVPB (0 mg Intravenous Stopped 10/12/17 1000)  morphine 4 MG/ML injection 4 mg (4 mg Intravenous Given 10/12/17 0706)  morphine 4 MG/ML injection 4 mg (4 mg Intravenous Given 10/12/17 1151)     Initial Impression / Assessment and Plan / ED Course  I have reviewed the triage vital signs and the nursing notes.  Pertinent labs & imaging results that were available during my care of the patient were reviewed by me and considered in my medical decision making (see chart for details).  Discussed pt presentation and exam findings with Dr. Florina Ou, who evaluated the pt. Advises to wait for lab results to order imaging of RUQ. Advises that CTA chest does not need to be ordered at this time given patient is already anticoagulated.   710 AM Rechecked pt. States she feels somewhat improved after meds. Appears more comfortable. Still tachy in the 130s. Stable BP. O2 sats WNL. Abx and fluids running.  8:04 AM Rechecked pt. She is sleeping comfortably. States pain has improved somewhat. Still has RUQ TTP on exam. [place] will be ordered. Still tachy in 120s   8:21 AM CONSULT with Dr. Maylene Roes who would like to wait for RUQ [place] prior to admission.   10:24 AM CONSULT with Dr. Barry Dienes with general surgery who will see the patient. Recommended medicine admission.   10:30 AM CONSULT with Dr.  Choi who will admit the patient.  Reevaluated patient.  Discussed the plan for admission and the plan for general surgery consult.  Discussed all the results of labs and imaging. PT in agreement with plan. States she is still having some pain. Will order additional pain medication. No nausea.   Final  Clinical Impressions(s) / ED Diagnoses   Final diagnoses:  RUQ abdominal pain  Sepsis, due to unspecified organism ([place])   Pt presenting with NVD, RUQ abd pain, right lower chest pain and dyspnea. Febrile and tachycardic on initial eval. BP WNL. spo2 95% on RA. tachypneic in the 20s. Recently dx with UTI and started on cipro.   CBC with no leukocytosis. Anemia to 7.4, decreased to 9.2 from two weeks ago. CMP without gross electrolyte derangement. AST 69 and ALT 80, increased from two weeks ago. Total bilirubin elevated to 1.5, up from 0.3 two weeks ago. RUQ [place] ordered. PT-INR is elevated above 4. UA with moderate hgb, ketones, and leukocytes, 6-10 WBC and few bacteria. No nitrites. Culture sent. Trop negative. Beta hcg negative.  Initial lactic acid borderline at 1.97.  ECG with sinus tachycardia, no other acute changes. CXR negative for infiltrates.   Code sepsis initiated and empiric abx ordered. Pt has remained stable in the emergency department though has been persistently tachycardic. She has had no episodes of vomiting and pain has been managed. RUS US showed thickening of gallbladder concerning for acute cholecytitis, clinical correlation recommended. General surgery consulted as above and they will see the patient. they recommended medicine admission.  Will admit for further treatment of sepsis. Source of infection remains undifferentiated however most likely ddx for sxs includes acute cholecystitis, UTI, pyelonephritis, or gastroenteritis.   Pt admitted to the medicine team.   ED Discharge Orders    None       Bishop Dublin 10/12/17 1617    Molpus, Jenny Reichmann, MD 10/12/17 2241

## 2017-10-12 NOTE — Progress Notes (Signed)
Pharmacy Antibiotic Note  Debbie Martinez is a 42 y.o. female admitted on 10/12/2017 with acute cholecystitis.  Pharmacy has been consulted for Zosyn dosing.  Plan: Zosyn 3.375gm IV q8h (4hr extended infusions) No dose adjustments needed, Pharmacy will sign off  Height: 5\' 7"  (170.2 cm) Weight: 155 lb (70.3 kg) IBW/kg (Calculated) : 61.6  Temp (24hrs), Avg:101.9 F (38.8 C), Min:101.9 F (38.8 C), Max:101.9 F (38.8 C)  Recent Labs  Lab 10/12/17 0603 10/12/17 0649 10/12/17 0908  WBC 7.3  --   --   CREATININE 0.51  --   --   LATICACIDVEN 1.9 1.97* 1.60    Estimated Creatinine Clearance: 90 mL/min (by C-G formula based on SCr of 0.51 mg/dL).    Allergies  Allergen Reactions  . Lisinopril Swelling and Cough    Swelling under eyes and eyelids    . Tamoxifen Itching    Antimicrobials this admission:  6/22 Vanc x 1 6/22 Zosyn >>  Dose adjustments this admission:  none  Microbiology results:  6/22 BCx: sent 6/22 UCx: 6/22 C.diff:  Thank you for allowing pharmacy to be a part of this patient's care.  Peggyann Juba, PharmD, BCPS Pager: [phone] 10/12/2017 10:59 AM

## 2017-10-12 NOTE — Consult Note (Signed)
Reason for Consult:RUQ pain Referring Physician: Christan Martinez is an 42 y.o. female.  HPI:  Pt is a 42 yo F who presented to ED with 24-48 hours of RUQ pain, nausea and vomiting.  She denies prior pain like this.  She has metastatic breast cancer and is actively getting treated for that as well as for acute PE in the last month.  Last chemo tx was around 3 weeks ago.  She has had fever/chills as well.  She has had a recent UTI that has been being treated with ciprofloxacin and has been improving.    She came in with temp 101.9 and tachycardia.  She has been being treated with sepsis protocol.   Her workup showed mild gallbladder sludge and mildly edematous gallbladder wall.  She has hypoalbuminemia which is not unexpected.  LFTs are elevated, more so that they have been as outpatient with her known hepatic metastases.    Past Medical History:  Diagnosis Date  . Anxiety    gets sweaty and short of breath  . Breast cancer ([place]) 01/20/14   triple positive  . Diabetes   . GERD (gastroesophageal reflux disease)    does not take anything  . History of radiation therapy 11/03/14- 12/14/14   Right Breast, supraclavicular nodes, axillary nodes, and internal mammary nodes.  . History of radiation therapy 01/04/16- 01/23/16   Spine metastasis L2-S2  . Hypertension   . Liver metastases ([place])   . Metastatic cancer to bone ([place])   . Shortness of breath    'anxiety'    Past Surgical History:  Procedure Laterality Date  . BREAST LUMPECTOMY WITH NEEDLE LOCALIZATION AND AXILLARY SENTINEL LYMPH NODE BX Right 08/23/2014   Procedure: RIGHT BREAST LUMPECTOMY WITH NEEDLE LOCALIZATION(X'S 2)AND RIGHT AXILLARY SENTINEL LYMPH NODE Bi[facility]ies;  Surgeon: Autumn Messing III, MD;  Location: [facility];  Service: General;  Laterality: Right;  . CESAREAN SECTION     2008  . KELOID EXCISION Left    ear  . PORTACATH PLACEMENT Left 01/26/2014   Procedure: INSERTION PORT-A-CATH;  Surgeon: Autumn Messing III, MD;  Location: [place];  Service: General;  Laterality: Left;  . RE-EXCISION OF BREAST CANCER,SUPERIOR MARGINS Right 09/15/2014   Procedure: RE-EXCISION OF RIGHT BREAST CANCER,SUPERIOR AND INFERIOR MARGINS;  Surgeon: Autumn Messing III, MD;  Location: [facility];  Service: General;  Laterality: Right;    Family History  Problem Relation Age of Onset  . Heart attack Father 57  . Lupus Maternal Aunt   . Prostate cancer Maternal Grandfather 19  . Dementia Paternal Grandmother   . Leukemia Cousin 10       paternal first cousin    Social History:  reports that she has never smoked. She has never used smokeless tobacco. She reports that she drinks alcohol. She reports that she does not use drugs.  Allergies:  Allergies  Allergen Reactions  . Lisinopril Swelling and Cough    Swelling under eyes and eyelids    . Tamoxifen Itching    Medications:  Prior to Admission:  Medications Prior to Admission  Medication Sig Dispense Refill Last Dose  . acetaminophen (TYLENOL) 500 MG tablet Take 1,000 mg by mouth every 4 (four) hours as needed for moderate pain.   10/12/2017 at Unknown time  . Cholecalciferol (VITAMIN D-3 PO) Take 1 capsule by mouth daily.   Past Week at Unknown time  . ciprofloxacin (CIPRO) 250 MG tablet Take 250 mg by mouth 2 (two) times daily.  0 10/11/2017 at Unknown time  . ENSURE (ENSURE) Take 237 mLs by mouth 2 (two) times daily between meals.    Past Week at Unknown time  . fentaNYL (DURAGESIC - DOSED MCG/HR) 25 MCG/HR patch Place 1 patch (25 mcg total) onto the skin every 3 (three) days. 10 patch 0 Past Week at Unknown time  . fluticasone (FLONASE) 50 MCG/ACT nasal spray Place 2 sprays into both nostrils 2 (two) times daily as needed for allergies.  2 Past Week at Unknown time  . Fluticasone Furoate-Vilanterol (BREO ELLIPTA IN) Inhale 1 puff into the lungs daily as needed (Shortness of breath).   10/12/2017 at Unknown time  . gabapentin (NEURONTIN) 100 MG capsule TAKE 1 CAPSULE BY MOUTH  THREE TIMES A DAY  2 10/12/2017 at Unknown time  . IRON PO Take 1 tablet by mouth 2 (two) times daily.   10/12/2017 at Unknown time  . JANUMET 50-500 MG tablet Take 1 tablet by mouth daily.  0 10/12/2017 at Unknown time  . lidocaine-prilocaine (EMLA) cream Apply to affected area once 30 g 3 Past Week at Unknown time  . Lidocaine-Prilocaine, Bulk, 2.5-2.5 % CREA Apply 5 mLs topically as needed. 2 hours before use, cover with plawtic wrap. 30 g 2 Past Week at Unknown time  . LORazepam (ATIVAN) 0.5 MG tablet Take 1 tablet (0.5 mg total) by mouth at bedtime as needed (Nausea or vomiting). 30 tablet 0 Past Week at Unknown time  . Multiple Vitamins-Minerals (MULTIVITAMIN ADULT) TABS Take 1 tablet by mouth daily.   10/12/2017 at Unknown time  . omeprazole (PRILOSEC) 20 MG capsule Take 1 capsule (20 mg total) by mouth daily. Take while using dexamethasone to prevent heartburn. (Patient taking differently: Take 20 mg by mouth daily as needed (Heartburn). ) 30 capsule 2 Past Week at Unknown time  . ondansetron (ZOFRAN) 8 MG tablet Take 1 tablet (8 mg total) by mouth 2 (two) times daily as needed for refractory nausea / vomiting. Start on day 3 after chemo. 30 tablet 1 Past Week at Unknown time  . polyethylene glycol (MIRALAX / GLYCOLAX) packet Take 17 g by mouth daily. (Patient taking differently: Take 17 g by mouth daily as needed for mild constipation. ) 14 each 0 Past Week at Unknown time  . prochlorperazine (COMPAZINE) 10 MG tablet Take 1 tablet (10 mg total) by mouth every 6 (six) hours as needed (Nausea or vomiting). 30 tablet 1 10/11/2017 at Unknown time  . rivaroxaban (XARELTO) 20 MG TABS tablet Take 1 tablet (20 mg total) by mouth daily with supper. 30 tablet 0 10/11/2017 at 1230  . valsartan (DIOVAN) 80 MG tablet Take 1 tablet (80 mg total) by mouth daily. 30 tablet 0 10/11/2017 at Unknown time  . ACCU-CHEK AVIVA PLUS test strip USE AS DIRECTED 4 TIMES A DAY 1 each 12 Taking  . ACCU-CHEK SOFTCLIX LANCETS  lancets 100 each by Other route 4 (four) times daily. Use as instructed 100 each 0 Taking  . blood glucose meter kit and supplies KIT Dispense based on patient and insurance preference. Use up to four times daily as directed. (FOR ICD-9 250.00, 250.01). 1 each 0 Taking  . gabapentin (NEURONTIN) 300 MG capsule Take 1 capsule (300 mg total) by mouth 3 (three) times daily. (Patient not taking: Reported on 10/12/2017) 90 capsule 5 Not Taking at Unknown time  . oxyCODONE-acetaminophen (PERCOCET/ROXICET) 5-325 MG tablet Take 1-2 tablets by mouth every 6 (six) hours as needed for severe pain. 60 tablet 0   .  Rivaroxaban (XARELTO) 15 MG TABS tablet Take 1 tablet (15 mg total) by mouth 2 (two) times daily with a meal. (Patient not taking: Reported on 10/12/2017) 37 tablet 0 Completed Course at Unknown time  . traMADol (ULTRAM) 50 MG tablet Take 1 tablet (50 mg total) by mouth every 6 (six) hours as needed. (Patient taking differently: Take 50 mg by mouth every 6 (six) hours as needed for moderate pain. ) 45 tablet 0 unknown    Results for orders placed or performed during the hospital encounter of 10/12/17 (from the past 48 hour(s))  Comprehensive metabolic panel     Status: Abnormal   Collection Time: 10/12/17  6:03 AM  Result Value Ref Range   Sodium 134 (L) 135 - 145 mmol/L   Potassium 4.5 3.5 - 5.1 mmol/L   Chloride 99 (L) 101 - 111 mmol/L   CO2 21 (L) 22 - 32 mmol/L   Glucose, Bld 125 (H) 65 - 99 mg/dL   BUN 12 6 - 20 mg/dL   Creatinine, Ser 0.51 0.44 - 1.00 mg/dL   Calcium 8.5 (L) 8.9 - 10.3 mg/dL   Total Protein 7.8 6.5 - 8.1 g/dL   Albumin 2.7 (L) 3.5 - 5.0 g/dL   AST 69 (H) 15 - 41 U/L   ALT 80 (H) 14 - 54 U/L   Alkaline Phosphatase 205 (H) 38 - 126 U/L   Total Bilirubin 1.5 (H) 0.3 - 1.2 mg/dL   GFR calc non Af Amer >60 >60 mL/min   GFR calc Af Amer >60 >60 mL/min    Comment: (NOTE) The eGFR has been calculated using the CKD EPI equation. This calculation has not been validated in all  clinical situations. eGFR's persistently <60 mL/min signify possible Chronic Kidney Disease.    Anion gap 14 5 - 15    Comment: Performed at [facility], [place] [address] [place], [place] [address]  CBC with Differential     Status: Abnormal   Collection Time: 10/12/17  6:03 AM  Result Value Ref Range   WBC 7.3 4.0 - 10.5 K/uL   RBC 2.92 (L) 3.87 - 5.11 MIL/uL   Hemoglobin 7.4 (L) 12.0 - 15.0 g/dL   HCT 23.7 (L) 36.0 - 46.0 %   MCV 81.2 78.0 - 100.0 fL   MCH 25.3 (L) 26.0 - 34.0 pg   MCHC 31.2 30.0 - 36.0 g/dL   RDW 20.6 (H) 11.5 - 15.5 %   Platelets 196 150 - 400 K/uL   Neutrophils Relative % 73 %   Lymphocytes Relative 14 %   Monocytes Relative 13 %   Eosinophils Relative 0 %   Basophils Relative 0 %   Neutro Abs 5.4 1.7 - 7.7 K/uL   Lymphs Abs 1.0 0.7 - 4.0 K/uL   Monocytes Absolute 0.9 0.1 - 1.0 K/uL   Eosinophils Absolute 0.0 0.0 - 0.7 K/uL   Basophils Absolute 0.0 0.0 - 0.1 K/uL   RBC Morphology POLYCHROMASIA PRESENT     Comment: Performed at [facility], [place] [address], [place], [place] [address]  Protime-INR     Status: Abnormal   Collection Time: 10/12/17  6:03 AM  Result Value Ref Range   Prothrombin Time 40.4 (H) 11.4 - 15.2 seconds   INR 4.23 (HH)     Comment: CRITICAL RESULT CALLED TO, READ BACK BY AND VERIFIED WITH: Alinda Sierras [id number] [phone]  BY V.WILKINS Performed at [facility], [place] [address], [place], [place] [address]   Urinalysis,  Routine w reflex microscopic     Status: Abnormal   Collection Time: 10/12/17  6:03 AM  Result Value Ref Range   Color, Urine YELLOW YELLOW   APPearance HAZY (A) CLEAR   Specific Gravity, Urine 1.008 1.005 - 1.030   pH 5.0 5.0 - 8.0   Glucose, UA NEGATIVE NEGATIVE mg/dL   Hgb urine dipstick MODERATE (A) NEGATIVE   Bilirubin Urine NEGATIVE NEGATIVE   Ketones, ur 20 (A) NEGATIVE mg/dL   Protein, ur NEGATIVE NEGATIVE mg/dL   Nitrite NEGATIVE NEGATIVE   Leukocytes,  UA MODERATE (A) NEGATIVE   RBC / HPF 0-5 0 - 5 RBC/hpf   WBC, UA 6-10 0 - 5 WBC/hpf   Bacteria, UA FEW (A) NONE SEEN   Squamous Epithelial / LPF 0-5 0 - 5    Comment: Performed at [facility], [place] [address], [place], [place] [address]  Lactic acid, plasma     Status: None   Collection Time: 10/12/17  6:03 AM  Result Value Ref Range   Lactic Acid, Venous 1.9 0.5 - 1.9 mmol/L    Comment: Performed at [facility], [place] [address], [place], [place] [address]  I-Stat beta hCG blood, ED     Status: None   Collection Time: 10/12/17  6:48 AM  Result Value Ref Range   I-stat hCG, quantitative <5.0 <5 mIU/mL   Comment 3            Comment:   GEST. AGE      CONC.  (mIU/mL)   <=1 WEEK        5 - 50     2 WEEKS       50 - 500     3 WEEKS       100 - 10,000     4 WEEKS     1,000 - 30,000        FEMALE AND NON-PREGNANT FEMALE:     LESS THAN 5 mIU/mL   I-Stat CG4 Lactic Acid, ED     Status: Abnormal   Collection Time: 10/12/17  6:49 AM  Result Value Ref Range   Lactic Acid, Venous 1.97 (H) 0.5 - 1.9 mmol/L  I-Stat Troponin, ED (not at [facility])     Status: None   Collection Time: 10/12/17  7:31 AM  Result Value Ref Range   Troponin i, poc 0.01 0.00 - 0.08 ng/mL   Comment 3            Comment: Due to the release kinetics of cTnI, a negative result within the first hours of the onset of symptoms does not rule out myocardial infarction with certainty. If myocardial infarction is still suspected, repeat the test at appropriate intervals.   I-Stat CG4 Lactic Acid, ED     Status: None   Collection Time: 10/12/17  9:08 AM  Result Value Ref Range   Lactic Acid, Venous 1.60 0.5 - 1.9 mmol/L  Lipase, blood     Status: None   Collection Time: 10/12/17 12:42 PM  Result Value Ref Range   Lipase 18 11 - 51 U/L    Comment: Performed at [facility], [place] [address], [place], [place] [address]  Glucose, capillary     Status: None   Collection  Time: 10/12/17  1:07 PM  Result Value Ref Range   Glucose-Capillary 97 65 - 99 mg/dL    Dg Chest Portable 1 View  Result Date: 10/12/2017 CLINICAL DATA:  Nausea, vomiting and  diarrhea beginning yesterday. History of breast cancer. Patient on ciprofloxacin for UTI over the past week. EXAM: PORTABLE CHEST 1 VIEW COMPARISON:  09/04/2017 FINDINGS: Left subclavian Port-A-Cath has tip just below the cavoatrial junction. Lungs are hypoinflated without focal airspace consolidation or effusion. Cardiomediastinal silhouette and remainder of the exam is unchanged. IMPRESSION: Hypoinflation without acute cardiopulmonary disease. Electronically Signed   By: Marin Olp M.D.   On: 10/12/2017 07:13   US Abdomen Limited Ruq  Result Date: 10/12/2017 CLINICAL DATA:  Chest pain with nausea and vomiting 1 week. Patient [redacted] weeks pregnant. Metastatic breast cancer. EXAM: ULTRASOUND ABDOMEN LIMITED RIGHT UPPER QUADRANT COMPARISON:  CT 09/02/2017 FINDINGS: Gallbladder: There is a mild amount of gallbladder sludge present. No definite gallstones. There is mild edematous gallbladder wall thickening measuring 5.9 mm. Negative sonographic Murphy's sign. No adjacent free fluid. Common bile duct: Diameter: 4.3 mm. Liver: Mild heterogeneous parenchymal pattern with 6.1 cm oval hypoechoic mass likely representing patient's known metastatic liver disease. Portal vein is patent on color Doppler imaging with normal direction of blood flow towards the liver. IMPRESSION: Mild gallbladder sludge with mild edematous gallbladder wall thickening. Recommend clinical correlation for acute cholecystitis. Heterogeneous hepatic parenchymal pattern with 6.1 cm hypoechoic mass present. This is likely due to patient's known metastatic breast cancer. Electronically Signed   By: Marin Olp M.D.   On: 10/12/2017 09:55    Review of Systems  Constitutional: Positive for chills, fever and malaise/fatigue.  HENT:       Alopecia  Eyes: Negative.    Respiratory: Negative.   Cardiovascular: Negative.   Gastrointestinal: Positive for abdominal pain, diarrhea, nausea and vomiting.  Genitourinary: Positive for dysuria.  Musculoskeletal: Negative.   Skin: Negative.   Neurological: Negative.   Endo/Heme/Allergies:       On xarelto  Psychiatric/Behavioral: Negative.      Blood pressure 130/87, pulse (!) 126, temperature 98.7 F (37.1 C), temperature source Oral, resp. rate (!) 32, height _0  (1.702 m), weight 70.3 kg (155 lb), SpO2 96 %. Physical Exam  Constitutional: She is oriented to person, place, and time. She appears well-developed and well-nourished. She appears distressed.  HENT:  Head: Normocephalic.  Mouth/Throat: Oropharynx is clear and moist. No oropharyngeal exudate.  alopecia  Eyes: Pupils are equal, round, and reactive to light. Conjunctivae and EOM are normal. Right eye exhibits no discharge. Left eye exhibits no discharge. No scleral icterus.  Neck: Normal range of motion. Neck supple. No tracheal deviation present. No thyromegaly present.  Cardiovascular: Regular rhythm, normal heart sounds and intact distal pulses. Exam reveals no gallop and no friction rub.  No murmur heard. tachycardic  Respiratory: Effort normal and breath sounds normal. No respiratory distress. She has no wheezes. She has no rales. She exhibits no tenderness.  GI: Soft. She exhibits no distension and no mass. There is tenderness (RUQ/epigastrium). There is no rebound and no guarding.  Musculoskeletal: Normal range of motion. She exhibits no edema, tenderness or deformity.  Lymphadenopathy:    She has no cervical adenopathy.  Neurological: She is alert and oriented to person, place, and time. Coordination normal.  Skin: Skin is warm and dry. No rash noted. She is not diaphoretic. No erythema. There is pallor.  Psychiatric: She has a normal mood and affect. Her behavior is normal. Judgment and thought content normal.     Assessment/Plan:  Sepsis RUQ pain Elevated LFTs Recurrent widely metastatic breast cancer including liver metastases Recent PE with anticoagulation Protein calorie malnutrition   Will get HIDA  scan as findings of sludge and mild thickening are not positive for acute cholecystitis and sepsis.  We frequently see pericholecystic fluid in those with hypoalbuminemia and liver metastases.   If positive, will discuss pros and cons of perc chole tube with surgery.  She has recent PE, so holding anticoagulation is not desired to minimize risk of progression.  She is complicated patient and will need multidisciplinary care.  She is at risk of chronic and acalculous cholecystitis due to active immunosuppression.    Check prealbumin  It is not clear to me if her INR is out due to xarelto, malnutrition, chemo effects on hepatic production, metastatic burden or what.  This is a factor to consider when planning interventions.    Debbie Martinez 10/12/2017, 2:33 PM

## 2017-10-12 NOTE — ED Notes (Signed)
She has been in no distress and is happy with her pain level after IV morphine given. She is alert and oriented x 4 with clear speech.

## 2017-10-13 ENCOUNTER — Inpatient Hospital Stay (HOSPITAL_COMMUNITY): Payer: Medicare Other

## 2017-10-13 ENCOUNTER — Encounter (HOSPITAL_COMMUNITY)
Admission: EM | Disposition: A | Discharge status: Home Health | Payer: Self-pay | Source: Home / Self Care | Attending: Internal Medicine

## 2017-10-13 ENCOUNTER — Encounter (HOSPITAL_COMMUNITY): Payer: Self-pay | Admitting: *Deleted

## 2017-10-13 DIAGNOSIS — D638 Anemia in other chronic diseases classified elsewhere: Secondary | ICD-10-CM

## 2017-10-13 DIAGNOSIS — R112 Nausea with vomiting, unspecified: Secondary | ICD-10-CM

## 2017-10-13 DIAGNOSIS — E43 Unspecified severe protein-calorie malnutrition: Secondary | ICD-10-CM

## 2017-10-13 DIAGNOSIS — R0789 Other chest pain: Secondary | ICD-10-CM

## 2017-10-13 LAB — GLUCOSE, CAPILLARY
GLUCOSE-CAPILLARY: 124 mg/dL — AB (ref 65–99)
GLUCOSE-CAPILLARY: 64 mg/dL — AB (ref 65–99)
GLUCOSE-CAPILLARY: 71 mg/dL (ref 65–99)
GLUCOSE-CAPILLARY: 79 mg/dL (ref 65–99)

## 2017-10-13 LAB — ABO/RH: ABO/RH(D): A POS

## 2017-10-13 LAB — COMPREHENSIVE METABOLIC PANEL
ALBUMIN: 2.3 g/dL — AB (ref 3.5–5.0)
ALK PHOS: 163 U/L — AB (ref 38–126)
ALT: 54 U/L (ref 14–54)
AST: 44 U/L — ABNORMAL HIGH (ref 15–41)
Anion gap: 8 (ref 5–15)
BILIRUBIN TOTAL: 1.2 mg/dL (ref 0.3–1.2)
BUN: 8 mg/dL (ref 6–20)
CALCIUM: 7.5 mg/dL — AB (ref 8.9–10.3)
CO2: 25 mmol/L (ref 22–32)
CREATININE: 0.46 mg/dL (ref 0.44–1.00)
Chloride: 106 mmol/L (ref 101–111)
GFR calc Af Amer: >60 mL/min (ref >60)
GFR calc non Af Amer: >60 mL/min (ref >60)
GLUCOSE: 82 mg/dL (ref 65–99)
Potassium: 3.7 mmol/L (ref 3.5–5.1)
SODIUM: 139 mmol/L (ref 135–145)
TOTAL PROTEIN: 6.4 g/dL — AB (ref 6.5–8.1)

## 2017-10-13 LAB — CBC
HCT: 30.3 % — ABNORMAL LOW (ref 36.0–46.0)
HEMATOCRIT: 19.7 % — AB (ref 36.0–46.0)
HEMOGLOBIN: 6.1 g/dL — AB (ref 12.0–15.0)
HEMOGLOBIN: 9.9 g/dL — AB (ref 12.0–15.0)
MCH: 25 pg — ABNORMAL LOW (ref 26.0–34.0)
MCH: 26.6 pg (ref 26.0–34.0)
MCHC: 31 g/dL (ref 30.0–36.0)
MCHC: 32.7 g/dL (ref 30.0–36.0)
MCV: 80.7 fL (ref 78.0–100.0)
MCV: 81.5 fL (ref 78.0–100.0)
Platelets: 144 10*3/uL — ABNORMAL LOW (ref 150–400)
Platelets: 150 10*3/uL (ref 150–400)
RBC: 2.44 MIL/uL — AB (ref 3.87–5.11)
RBC: 3.72 MIL/uL — ABNORMAL LOW (ref 3.87–5.11)
RDW: 20.2 % — ABNORMAL HIGH (ref 11.5–15.5)
RDW: 18.7 % — ABNORMAL HIGH (ref 11.5–15.5)
WBC: 5.4 10*3/uL (ref 4.0–10.5)
WBC: 7 10*3/uL (ref 4.0–10.5)

## 2017-10-13 LAB — PROTIME-INR
INR: 1.74
Prothrombin Time: 20.2 seconds — ABNORMAL HIGH (ref 11.4–15.2)

## 2017-10-13 LAB — PREPARE RBC (CROSSMATCH)

## 2017-10-13 SURGERY — LAPAROSCOPIC CHOLECYSTECTOMY WITH INTRAOPERATIVE CHOLANGIOGRAM
Anesthesia: General

## 2017-10-13 MED ORDER — DEXTROSE 50 % IV SOLN
25.0000 mL | Freq: Once | INTRAVENOUS | Status: AC
  Administered 2017-10-13: 25 mL via INTRAVENOUS
  Filled 2017-10-13: qty 50

## 2017-10-13 MED ORDER — GABAPENTIN 100 MG PO CAPS: 100.0000 mg | ORAL_CAPSULE | Freq: Three times a day (TID) | ORAL | Status: DC

## 2017-10-13 MED ORDER — MORPHINE SULFATE (PF) 2 MG/ML IV SOLN
2.0000 mg | INTRAVENOUS | Status: DC | PRN
  Administered 2017-10-13 (×2): 2 mg via INTRAVENOUS
  Administered 2017-10-13: 4 mg via INTRAVENOUS
  Administered 2017-10-13 (×2): 2 mg via INTRAVENOUS
  Administered 2017-10-13: 4 mg via INTRAVENOUS
  Administered 2017-10-13 – 2017-10-14 (×2): 2 mg via INTRAVENOUS
  Administered 2017-10-14: 4 mg via INTRAVENOUS
  Administered 2017-10-14: 2 mg via INTRAVENOUS
  Filled 2017-10-13: qty 2
  Filled 2017-10-13 (×5): qty 1
  Filled 2017-10-13 (×2): qty 2
  Filled 2017-10-13: qty 1
  Filled 2017-10-13: qty 2
  Filled 2017-10-13: qty 1

## 2017-10-13 MED ORDER — METHOCARBAMOL 500 MG PO TABS
500.0000 mg | ORAL_TABLET | Freq: Three times a day (TID) | ORAL | Status: DC
  Administered 2017-10-13 – 2017-10-18 (×16): 500 mg via ORAL
  Filled 2017-10-13 (×17): qty 1

## 2017-10-13 MED ORDER — DEXTROSE-NACL 5-0.9 % IV SOLN
INTRAVENOUS | Status: DC
  Administered 2017-10-13 (×2): via INTRAVENOUS

## 2017-10-13 MED ORDER — BENZONATATE 100 MG PO CAPS
100.0000 mg | ORAL_CAPSULE | Freq: Three times a day (TID) | ORAL | Status: DC | PRN
  Administered 2017-10-13: 100 mg via ORAL
  Filled 2017-10-13: qty 1

## 2017-10-13 MED ORDER — IOPAMIDOL (ISOVUE-370) INJECTION 76%
INTRAVENOUS | Status: AC
  Administered 2017-10-13: 12:00:00
  Filled 2017-10-13: qty 100

## 2017-10-13 MED ORDER — INSULIN ASPART 100 UNIT/ML ~~LOC~~ SOLN
0.0000 [IU] | Freq: Three times a day (TID) | SUBCUTANEOUS | Status: DC
  Administered 2017-10-15 (×2): 2 [IU] via SUBCUTANEOUS
  Administered 2017-10-15: 3 [IU] via SUBCUTANEOUS
  Administered 2017-10-16: 1 [IU] via SUBCUTANEOUS
  Administered 2017-10-16 – 2017-10-17 (×2): 2 [IU] via SUBCUTANEOUS
  Administered 2017-10-17 (×2): 5 [IU] via SUBCUTANEOUS
  Administered 2017-10-18: 2 [IU] via SUBCUTANEOUS
  Administered 2017-10-18: 1 [IU] via SUBCUTANEOUS

## 2017-10-13 MED ORDER — MORPHINE SULFATE (PF) 2 MG/ML IV SOLN
2.0000 mg | Freq: Once | INTRAVENOUS | Status: AC
  Administered 2017-10-13: 2 mg via INTRAVENOUS
  Filled 2017-10-13: qty 1

## 2017-10-13 MED ORDER — SODIUM CHLORIDE 0.9% IV SOLUTION
Freq: Once | INTRAVENOUS | Status: AC
  Administered 2017-10-13: 11:00:00 via INTRAVENOUS

## 2017-10-13 MED ORDER — RIVAROXABAN 20 MG PO TABS
20.0000 mg | ORAL_TABLET | Freq: Every day | ORAL | Status: DC
  Administered 2017-10-14 – 2017-10-18 (×5): 20 mg via ORAL
  Filled 2017-10-13 (×5): qty 1

## 2017-10-13 MED ORDER — GABAPENTIN 300 MG PO CAPS
300.0000 mg | ORAL_CAPSULE | Freq: Three times a day (TID) | ORAL | Status: DC
  Administered 2017-10-13 – 2017-10-18 (×16): 300 mg via ORAL
  Filled 2017-10-13 (×17): qty 1

## 2017-10-13 MED ORDER — IOPAMIDOL (ISOVUE-370) INJECTION 76%
100.0000 mL | Freq: Once | INTRAVENOUS | Status: AC | PRN
  Administered 2017-10-13: 100 mL via INTRAVENOUS

## 2017-10-13 NOTE — Progress Notes (Signed)
[facility] Progress Note  Patient: Debbie Martinez NAT:[id number]   PCP: Lucianne Lei, MD DOB: Jul 01, 1975   DOA: 10/12/2017   DOS: 10/13/2017   Date of Service: the patient was seen and examined on 10/13/2017  Subjective: Has severe pain located on the right side of chest wall primarily in the axillary area.  Pleuritic in nature with stabbing.  No nausea no vomiting.  No fever no chills at present.  No pain in her stomach.  No active bleeding noted or reported.  Brief hospital course: Pt. with PMH of metastatic right breast cancer S/P radiation and chemotherapy, type II DM, PE on Xarelto; admitted on 10/12/2017, presented with complaint of nausea vomiting and right-sided pain, was found to have pain associated with metastatic cancer. Currently further plan is pain control.  Assessment and Plan: 1.  Right-sided chest wall pain. Suspicion for pneumonia Patient presented with severe right-sided pleuritic chest onset with nausea and vomiting. Initial concern was for acute cholecystitis and patient underwent ultrasound of the gallbladder which was also showing positive gallbladder sludge and edematous gallbladder. General surgery was consulted and patient underwent HIDA scan. HIDA scan was negative, cultures are so far negative as well and she does not have any right upper quadrant pain or Murphy sign on my examination. It appears that most of her pain is coming from either presence of lymphadenopathy, radiation pneumonitis, possible spread of cancer. Continue IV morphine, gabapentin. We will discuss with oncology tomorrow regarding further management of her pain control,?  Prednisone CT scan makes a comment for possible right middle lobe and upper lobe infiltrate although based on prior discussion with radiology it appears more like radiation-induced injury. Continue IV Zosyn for now.  2.  Metastatic breast cancer. Followed by Dr. Lindi Adie. We will discuss with them tomorrow  morning.  3.  Recent PE. Anemia.  Likely chemotherapy-induced. Patient started on Xarelto recently in May 2019. Anemia likely from chemotherapy. Hemoglobin dropped down to 6.1 this morning. Receiving blood transfusion. We will resume Xarelto. Monitor CBC post H&H. No active bleeding reported right now.  Close monitoring for now.  4.  Diabetes mellitus. Continue sliding scale insulin for now.  5.  Sinus tachycardia as well as hypertension. Patient is on ARB.  May require beta-blocker for now. Likely the tachycardia is from pain control therefore will focus more on pain control for now and monitor on telemetry.  Diet: Carb modified diet DVT Prophylaxis: subcutaneous Heparin  Advance goals of care discussion: full code  Family Communication: no family was present at bedside, at the time of interview.   Disposition:  Discharge to home.  Consultants: oncology Procedures: none  Antibiotics: Anti-infectives (From admission, onward)   Start     Dose/Rate Route Frequency Ordered Stop   10/12/17 1400  piperacillin-tazobactam (ZOSYN) IVPB 3.375 g     3.375 g 12.5 mL/hr over 240 Minutes Intravenous Every 8 hours 10/12/17 1100     10/12/17 0700  vancomycin (VANCOCIN) 1,500 mg in sodium chloride 0.9 % 500 mL IVPB     1,500 mg 250 mL/hr over 120 Minutes Intravenous  Once 10/12/17 0651 10/12/17 1000   10/12/17 0645  piperacillin-tazobactam (ZOSYN) IVPB 3.375 g     3.375 g 100 mL/hr over 30 Minutes Intravenous  Once 10/12/17 0644 10/12/17 0740   10/12/17 0645  vancomycin (VANCOCIN) IVPB 1000 mg/200 mL premix  Status:  Discontinued     1,000 mg 200 mL/hr over 60 Minutes Intravenous  Once 10/12/17 0644 10/12/17 0650  Objective: Physical Exam: Vitals:   10/12/17 2021 10/13/17 0547 10/13/17 1108 10/13/17 1135  BP: (!) 144/85 (!) 152/101 137/85 133/89  Pulse: (!) 123 (!) 103 (!) 114 (!) 120  Resp: 16 18 18 18   Temp:  98.3 F (36.8 C) 99.5 F (37.5 C) 98.6 F (37 C)   TempSrc:   Oral Axillary  SpO2: 98%  99% 100%  Weight:      Height:        Intake/Output Summary (Last 24 hours) at 10/13/2017 1338 Last data filed at 10/13/2017 1135 Gross per 24 hour  Intake 633.54 ml  Output -  Net 633.54 ml   Filed Weights   10/12/17 0554  Weight: 70.3 kg (155 lb)   General: Alert, Awake and Oriented to Time, Place and Person. Appear in marked distress, affect flat Eyes: PERRL, Conjunctiva normal ENT: Oral Mucosa clear moist. Neck: difficult to assess  JVD, no Abnormal Mass Or lumps Cardiovascular: S1 and S2 Present, no Murmur, Peripheral Pulses Present Respiratory: increased respiratory effort, Bilateral Air entry equal and Decreased, no use of accessory muscle, right Crackles, no wheezes Abdomen: Bowel Sound present, Soft and no tenderness, no hernia Skin: no redness, no Rash, no induration Extremities: trace Pedal edema, no calf tenderness Neurologic: Grossly no focal neuro deficit. Bilaterally Equal motor strength  Data Reviewed: CBC: Recent Labs  Lab 10/12/17 0603 10/13/17 0333  WBC 7.3 5.4  NEUTROABS 5.4  --   HGB 7.4* 6.1*  HCT 23.7* 19.7*  MCV 81.2 80.7  PLT 196 161*   Basic Metabolic Panel: Recent Labs  Lab 10/12/17 0603 10/13/17 0333  NA 134* 139  K 4.5 3.7  CL 99* 106  CO2 21* 25  GLUCOSE 125* 82  BUN 12 8  CREATININE 0.51 0.46  CALCIUM 8.5* 7.5*    Liver Function Tests: Recent Labs  Lab 10/12/17 0603 10/13/17 0333  AST 69* 44*  ALT 80* 54  ALKPHOS 205* 163*  BILITOT 1.5* 1.2  PROT 7.8 6.4*  ALBUMIN 2.7* 2.3*   Recent Labs  Lab 10/12/17 1242  LIPASE 18   No results for input(s): AMMONIA in the last 168 hours. Coagulation Profile: Recent Labs  Lab 10/12/17 0603 10/13/17 0333  INR 4.23* 1.74   Cardiac Enzymes: No results for input(s): CKTOTAL, CKMB, CKMBINDEX, TROPONINI in the last 168 hours. BNP (last 3 results) No results for input(s): PROBNP in the last 8760 hours. CBG: Recent Labs  Lab  10/12/17 2048 10/13/17 0001 10/13/17 0043 10/13/17 0331 10/13/17 0733  GLUCAP 137* 64* 124* 79 71   Studies: Ct Angio Chest Pe W Or Wo Contrast  Result Date: 10/13/2017 CLINICAL DATA:  Recent diagnosis of pulmonary embolism. History of metastatic breast cancer. Evaluate for acute or chronic PE. EXAM: CT ANGIOGRAPHY CHEST WITH CONTRAST TECHNIQUE: Multidetector CT imaging of the chest was performed using the standard protocol during bolus administration of intravenous contrast. Multiplanar CT image reconstructions and MIPs were obtained to evaluate the vascular anatomy. CONTRAST:  160mL ISOVUE-370 IOPAMIDOL (ISOVUE-370) INJECTION 76% COMPARISON:  Chest CTA - 09/04/2017 FINDINGS: Vascular Findings: There is adequate opacification of the pulmonary arterial system with the main pulmonary artery measuring 277 Hounsfield units. No evidence of acute or chronic pulmonary embolism special attention paid to the right lower lobe segmental and subsegmental pulmonary arteries at location of previously identified residual pulmonary embolism. Normal caliber the main pulmonary artery. Normal heart size.  No pericardial effusion. Normal caliber of the thoracic aorta. No definite evidence of thoracic aortic dissection or  periaortic stranding on this nongated examination. Conventional configuration of the aortic arch. The branch vessels of the aortic arch appear widely patent throughout their imaged course. Left subclavian vein approach port a catheter tip terminates within the superior cavoatrial junction. Review of the MIP images confirms the above findings. ---------------------------------------------------------------------------------- Nonvascular Findings: Mediastinum/Lymph Nodes: Worsening mediastinal and bilateral hilar lymphadenopathy with index prevascular nodal conglomeration measuring approximately 1.3 cm in greatest short axis diameter (image 28, series 4, previously, 0.6 cm, index subcarinal nodal  conglomeration now measuring approximately 3.5 x 1.7 cm (image 36), previously, 1.7 x 1.1 cm, index right suprahilar nodal conglomeration measuring 1.5 cm, previously, 0.9 cm and index left infrahilar nodal conglomeration now measuring 1 cm (image 36, series 4), previously, 0.3 cm. Lungs/Pleura: Interval development of a small right and trace left-sided pleural effusions. Worsening masslike consolidative opacities within the anterior subpleural aspects of the right upper and middle lobes (image 56, series 6). Interval development of multiple new bilateral pulmonary nodules with index nodule within the right upper lobe measuring 0.5 cm in greatest short axis diameter (image 39, series 6), index right middle lobe pulmonary nodule measuring 0.7 cm (image 60), index left lower lobe pulmonary nodule measuring 0.5 cm (image 520 and development of multiple punctate (sub 5 mm) pulmonary nodule within the bilateral lung apices. Upper abdomen: Progression of known hepatic metastatic disease with index lesion within the medial segment of the left lobe liver now measuring 3.7 cm in greatest short axis diameter (image 69, series 4, previously, 2.6 cm and index lesion within the subcapsular aspect of the anterior segment right lobe of liver measuring 2.6 cm (image 69), previously,2.0 cm. Musculoskeletal: Re demonstrated extensive osseous metastatic disease with multiple mixed lytic and sclerotic nodule seen throughout the thoracic spine and within the sternum. Unchanged approximately 3.6 cm presumed seroma within the upper inner quadrant of the right breast. IMPRESSION: 1. No evidence of acute or chronic pulmonary embolism. 2. Findings compatible with rapid progression of metastatic disease within the chest with development of multiple bilateral pulmonary nodules and worsening mediastinal and hilar adenopathy. 3. Interval development of small right and trace left-sided pleural effusions. 4. Continued progression of known hepatic  metastatic disease. 5. Grossly unchanged sequela of advanced osseous metastatic disease, primarily affecting the thoracic spine. Electronically Signed   By: Sandi Mariscal M.D.   On: 10/13/2017 13:15   Nm Hepatobiliary Liver Func  Result Date: 10/12/2017 CLINICAL DATA:  Right upper quadrant abdominal pain. EXAM: NUCLEAR MEDICINE HEPATOBILIARY IMAGING TECHNIQUE: Sequential images of the abdomen were obtained out to 60 minutes following intravenous administration of radiopharmaceutical. RADIOPHARMACEUTICALS:  5.4 mCi Tc-39m  Choletec IV COMPARISON:  CT scan of Sep 02, 2016. FINDINGS: Prompt uptake and biliary excretion of activity by the liver is seen. Gallbladder activity is visualized, consistent with patency of cystic duct. Biliary activity passes into small bowel, consistent with patent common bile duct. IMPRESSION: Normal uptake within the gallbladder.  No evidence of cholecystitis. Electronically Signed   By: Marijo Conception, M.D.   On: 10/12/2017 19:41    Scheduled Meds: . gabapentin  300 mg Oral TID  . insulin aspart  0-9 Units Subcutaneous Q4H  . irbesartan  75 mg Oral Daily  . sodium chloride flush  10-40 mL Intracatheter Q12H   Continuous Infusions: . dextrose 5 % and 0.9% NaCl 100 mL/hr at 10/13/17 0023  . piperacillin-tazobactam (ZOSYN)  IV 3.375 g (10/13/17 1318)   PRN Meds: acetaminophen **OR** acetaminophen, benzonatate, LORazepam, morphine injection, ondansetron **OR** ondansetron (  ZOFRAN) IV, sodium chloride flush  Time spent: 35 minutes  Author: Berle Mull, MD [facility] Pager: [phone] 10/13/2017 1:38 PM  If 7PM-7AM, please contact night-coverage at [email or web address], password [facility]

## 2017-10-13 NOTE — Progress Notes (Signed)
ANTICOAGULATION CONSULT NOTE - Initial Consult  Pharmacy Consult for Xarelto Indication: Hx of PE  Allergies  Allergen Reactions  . Lisinopril Swelling and Cough    Swelling under eyes and eyelids    . Tamoxifen Itching    Patient Measurements: Height: 5\' 7"  (170.2 cm) Weight: 155 lb (70.3 kg) IBW/kg (Calculated) : 61.6  Vital Signs: Temp: 99.5 F (37.5 C) (06/23 1500) Temp Source: Oral (06/23 1500) BP: 128/79 (06/23 1500) Pulse Rate: 117 (06/23 1500)  Labs: Recent Labs    10/12/17 0603 10/13/17 0333  HGB 7.4* 6.1*  HCT 23.7* 19.7*  PLT 196 144*  LABPROT 40.4* 20.2*  INR 4.23* 1.74  CREATININE 0.51 0.46    Estimated Creatinine Clearance: 90 mL/min (by C-G formula based on SCr of 0.46 mg/dL).   Medical History: Past Medical History:  Diagnosis Date  . Anxiety    gets sweaty and short of breath  . Breast cancer ([place]) 01/20/14   triple positive  . Diabetes   . GERD (gastroesophageal reflux disease)    does not take anything  . History of radiation therapy 11/03/14- 12/14/14   Right Breast, supraclavicular nodes, axillary nodes, and internal mammary nodes.  . History of radiation therapy 01/04/16- 01/23/16   Spine metastasis L2-S2  . Hypertension   . Liver metastases ([place])   . Metastatic cancer to bone ([place])   . Shortness of breath    'anxiety'    Medications:  Scheduled:  . gabapentin  300 mg Oral TID  . insulin aspart  0-9 Units Subcutaneous Q4H  . irbesartan  75 mg Oral Daily  . sodium chloride flush  10-40 mL Intracatheter Q12H   Infusions:  . dextrose 5 % and 0.9% NaCl 100 mL/hr at 10/13/17 0023  . piperacillin-tazobactam (ZOSYN)  IV 3.375 g (10/13/17 1318)   PRN: acetaminophen **OR** acetaminophen, benzonatate, LORazepam, morphine injection, ondansetron **OR** ondansetron (ZOFRAN) IV, sodium chloride flush  Assessment: 42 yo female on chronic Xarelto for history of PE presents with N/V and abdominal pain.  Xarelto was held on admission pending  surgical evaluation - no intervention planned so pharmacy consulted to resume dosing on 6/24.   INR elevated consistent with Xarelto use (4.23 > 1.74)  Hgb down 6.1 - transfusion ordered  SCr 0.46, CrCl 90 ml/min  Goal of Therapy:  Therapeutic anticoagulation Prevention of VTE   Plan:   Resume Xarelto 20mg  daily with supper on 6/24  No dose adjustments needed, Pharmacy will sign off  Peggyann Juba, PharmD, BCPS Pager: [phone] 10/13/2017,3:20 PM

## 2017-10-13 NOTE — Progress Notes (Signed)
Hypoglycemic Event  CBG: 64  Treatment: D50 IV 25 mL  Symptoms: None  Follow-up CBG: [id number] CBG Result:124  Possible Reasons for Event: Inadequate meal intake  Comments/MD notified:Bodenheimer NP New order for D5NS @ [address]

## 2017-10-13 NOTE — Progress Notes (Signed)
Debbie Martinez [id number] 1976-01-25  CARE TEAM:  PCP: Lucianne Lei, MD  Outpatient Care Team: Patient Care Team: Lucianne Lei, MD as PCP - General (Family Medicine) [name], MD as Consulting Physician (General Surgery) [name], MD as Consulting Physician (Hematology and Oncology) [name], MD as Attending Physician (Radiation Oncology) Benson Norway, RN as Registered Nurse (Oncology) Sylvan Cheese, NP as Nurse Practitioner (Hematology and Oncology)  Inpatient Treatment Team: Treatment Team: Attending Provider: Lavina Hamman, MD; Rounding Team: Edison Pace, Md, MD; Consulting Physician: [name], MD; Registered Nurse: Gertie Fey, RN; Technician: Adrian Saran, McKenzie Loni Muse, NT; Registered Nurse: Lauralyn Primes, RN; Rounding Team: Garner Gavel, MD; Technician: Demaris Callander, NT; Registered Nurse: Tacey Ruiz, RN   Problem List:   Principal Problem:   Nausea with vomiting Active Problems:   Breast cancer of upper-inner quadrant of right female breast [facility])   Hypertension   Bone metastasis ([place])   Cancer associated pain   Diabetes [facility])     Hospital Stay = 1 days  Assessment  Right-sided pain on right trunk.  Seems more chest wall based.  No evidence of any gallbladder abnormality by HIDA scan.  Plan:  -While technically feasible, I do not think cholecystectomy is warranted as I do not think that is a source of her problems.  Discussed with Dr. Posey Pronto.  Will be available.  I agree with work-up with repeat chest CT to make sure she does not have progression of her pulmonary embolism, worsening lung scarring or nodularity or other concerns.  -VTE prophylaxis- SCDs, etc -mobilize as tolerated to help recovery  20 minutes spent in review, evaluation, examination, counseling, and coordination of care.  More than 50% of that time was spent in counseling.  Adin Hector, MD, FACS, MASCRS Gastrointestinal and Minimally  Invasive Surgery    [address], [place], [place] [address] [phone] Main / Paging [phone] Fax   10/13/2017    Subjective: (Chief complaint)  HIDA scan done.  Negative for obstruction.  Still with significant right-sided chest wall pain.  Cannot lay on it.  Difficult to take deep breaths.  Not hypoxic though.  No nausea or vomiting  Objective:  Vital signs:  Vitals:   10/12/17 1218 10/12/17 2021 10/12/17 2021 10/13/17 0547  BP: 130/87  (!) 144/85 (!) 152/101  Pulse: (!) 126  (!) 123 (!) 103  Resp: (!) 32  16 18  Temp: 98.7 F (37.1 C) 98.2 F (36.8 C)  98.3 F (36.8 C)  TempSrc: Oral     SpO2: 96%  98%   Weight:      Height:        Last BM Date: 10/12/17  Intake/Output   Yesterday:  06/22 0701 - 06/23 0700 In: 2753.5 [I.V.:2693.3; IV Piggyback:60.2] Out: -  This shift:  No intake/output data recorded.  Bowel function:  Flatus: YES  BM:  No  Drain: (No drain)   Physical Exam:  General: Pt awake/alert/oriented x4 in mild acute distress Eyes: PERRL, normal EOM.  Sclera clear.  No icterus Neuro: CN II-XII intact w/o focal sensory/motor deficits. Lymph: No head/neck/groin lymphadenopathy Psych:  No delerium/psychosis/paranoia HENT: Normocephalic, Mucus membranes moist.  No thrush Neck: Supple, No tracheal deviation Chest: Moderate right-sided chest wall pain along lateral side.  Some pleuritic discomfort with deep breath and cough especially.  Decreased but relatively decent excursion CV:  Pulses intact.  Regular rhythm MS: Normal AROM mjr joints.  No  obvious deformity  Abdomen: Soft.  Nondistended.  Nontender.  No evidence of peritonitis.  No incarcerated hernias.  Ext:   No deformity.  No mjr edema.  No cyanosis Skin: No petechiae / purpura  Results:   Labs: Results for orders placed or performed during the hospital encounter of 10/12/17 (from the past 48 hour(s))  Comprehensive metabolic panel     Status:  Abnormal   Collection Time: 10/12/17  6:03 AM  Result Value Ref Range   Sodium 134 (L) 135 - 145 mmol/L   Potassium 4.5 3.5 - 5.1 mmol/L   Chloride 99 (L) 101 - 111 mmol/L   CO2 21 (L) 22 - 32 mmol/L   Glucose, Bld 125 (H) 65 - 99 mg/dL   BUN 12 6 - 20 mg/dL   Creatinine, Ser 0.51 0.44 - 1.00 mg/dL   Calcium 8.5 (L) 8.9 - 10.3 mg/dL   Total Protein 7.8 6.5 - 8.1 g/dL   Albumin 2.7 (L) 3.5 - 5.0 g/dL   AST 69 (H) 15 - 41 U/L   ALT 80 (H) 14 - 54 U/L   Alkaline Phosphatase 205 (H) 38 - 126 U/L   Total Bilirubin 1.5 (H) 0.3 - 1.2 mg/dL   GFR calc non Af Amer >60 >60 mL/min   GFR calc Af Amer >60 >60 mL/min    Comment: (NOTE) The eGFR has been calculated using the CKD EPI equation. This calculation has not been validated in all clinical situations. eGFR's persistently <60 mL/min signify possible Chronic Kidney Disease.    Anion gap 14 5 - 15    Comment: Performed at [facility], [place] [address], [place], [place] [address]  CBC with Differential     Status: Abnormal   Collection Time: 10/12/17  6:03 AM  Result Value Ref Range   WBC 7.3 4.0 - 10.5 K/uL   RBC 2.92 (L) 3.87 - 5.11 MIL/uL   Hemoglobin 7.4 (L) 12.0 - 15.0 g/dL   HCT 23.7 (L) 36.0 - 46.0 %   MCV 81.2 78.0 - 100.0 fL   MCH 25.3 (L) 26.0 - 34.0 pg   MCHC 31.2 30.0 - 36.0 g/dL   RDW 20.6 (H) 11.5 - 15.5 %   Platelets 196 150 - 400 K/uL   Neutrophils Relative % 73 %   Lymphocytes Relative 14 %   Monocytes Relative 13 %   Eosinophils Relative 0 %   Basophils Relative 0 %   Neutro Abs 5.4 1.7 - 7.7 K/uL   Lymphs Abs 1.0 0.7 - 4.0 K/uL   Monocytes Absolute 0.9 0.1 - 1.0 K/uL   Eosinophils Absolute 0.0 0.0 - 0.7 K/uL   Basophils Absolute 0.0 0.0 - 0.1 K/uL   RBC Morphology POLYCHROMASIA PRESENT     Comment: Performed at [facility], [place] [address], [place], [place] [address]  Protime-INR     Status: Abnormal   Collection Time: 10/12/17  6:03 AM  Result Value Ref Range    Prothrombin Time 40.4 (H) 11.4 - 15.2 seconds   INR 4.23 (HH)     Comment: CRITICAL RESULT CALLED TO, READ BACK BY AND VERIFIED WITH: Alinda Sierras [id number] '@[id number]'  BY V.WILKINS Performed at [facility] [address] [place], [place] [address]   Urinalysis, Routine w reflex microscopic     Status: Abnormal   Collection Time: 10/12/17  6:03 AM  Result Value Ref Range   Color, Urine YELLOW YELLOW   APPearance HAZY (A) CLEAR   Specific Gravity,  Urine 1.008 1.005 - 1.030   pH 5.0 5.0 - 8.0   Glucose, UA NEGATIVE NEGATIVE mg/dL   Hgb urine dipstick MODERATE (A) NEGATIVE   Bilirubin Urine NEGATIVE NEGATIVE   Ketones, ur 20 (A) NEGATIVE mg/dL   Protein, ur NEGATIVE NEGATIVE mg/dL   Nitrite NEGATIVE NEGATIVE   Leukocytes, UA MODERATE (A) NEGATIVE   RBC / HPF 0-5 0 - 5 RBC/hpf   WBC, UA 6-10 0 - 5 WBC/hpf   Bacteria, UA FEW (A) NONE SEEN   Squamous Epithelial / LPF 0-5 0 - 5    Comment: Performed at [facility], [place] [address], [place], [place] [address]  Lactic acid, plasma     Status: None   Collection Time: 10/12/17  6:03 AM  Result Value Ref Range   Lactic Acid, Venous 1.9 0.5 - 1.9 mmol/L    Comment: Performed at [facility], [place] [address], [place], [place] [address]  I-Stat beta hCG blood, ED     Status: None   Collection Time: 10/12/17  6:48 AM  Result Value Ref Range   I-stat hCG, quantitative <5.0 <5 mIU/mL   Comment 3            Comment:   GEST. AGE      CONC.  (mIU/mL)   <=1 WEEK        5 - 50     2 WEEKS       50 - 500     3 WEEKS       100 - 10,000     4 WEEKS     1,000 - 30,000        FEMALE AND NON-PREGNANT FEMALE:     LESS THAN 5 mIU/mL   I-Stat CG4 Lactic Acid, ED     Status: Abnormal   Collection Time: 10/12/17  6:49 AM  Result Value Ref Range   Lactic Acid, Venous 1.97 (H) 0.5 - 1.9 mmol/L  I-Stat Troponin, ED (not at [facility])     Status: None   Collection Time: 10/12/17  7:31 AM  Result Value Ref Range     Troponin i, poc 0.01 0.00 - 0.08 ng/mL   Comment 3            Comment: Due to the release kinetics of cTnI, a negative result within the first hours of the onset of symptoms does not rule out myocardial infarction with certainty. If myocardial infarction is still suspected, repeat the test at appropriate intervals.   I-Stat CG4 Lactic Acid, ED     Status: None   Collection Time: 10/12/17  9:08 AM  Result Value Ref Range   Lactic Acid, Venous 1.60 0.5 - 1.9 mmol/L  Blood Culture (routine x 2)     Status: None (Preliminary result)   Collection Time: 10/12/17 12:42 PM  Result Value Ref Range   Specimen Description      BLOOD SITE NOT SPECIFIED Performed at [facility] [address], [place], [place] [address]    Special Requests      BOTTLES DRAWN AEROBIC AND ANAEROBIC Blood Culture adequate volume Performed at [facility] [address] [place], [place] [address]    Culture PENDING    Report Status PENDING   Lipase, blood     Status: None   Collection Time: 10/12/17 12:42 PM  Result Value Ref Range   Lipase 18 11 - 51 U/L    Comment: Performed at [facility], [place] [address] [place], [place] [address]  Glucose, capillary     Status: None   Collection Time: 10/12/17  1:07 PM  Result Value Ref Range   Glucose-Capillary 97 65 - 99 mg/dL  Prealbumin     Status: Abnormal   Collection Time: 10/12/17  2:48 PM  Result Value Ref Range   Prealbumin <5 (L) 18 - 38 mg/dL    Comment: Performed at [facility] [address], [place], [place] [address]  Glucose, capillary     Status: None   Collection Time: 10/12/17  4:37 PM  Result Value Ref Range   Glucose-Capillary 74 65 - 99 mg/dL  Glucose, capillary     Status: None   Collection Time: 10/12/17  8:16 PM  Result Value Ref Range   Glucose-Capillary 65 65 - 99 mg/dL  Glucose, capillary     Status: Abnormal   Collection Time: 10/12/17  8:48 PM  Result Value Ref Range    Glucose-Capillary 137 (H) 65 - 99 mg/dL  Glucose, capillary     Status: Abnormal   Collection Time: 10/13/17 12:01 AM  Result Value Ref Range   Glucose-Capillary 64 (L) 65 - 99 mg/dL  Glucose, capillary     Status: Abnormal   Collection Time: 10/13/17 12:43 AM  Result Value Ref Range   Glucose-Capillary 124 (H) 65 - 99 mg/dL  Glucose, capillary     Status: None   Collection Time: 10/13/17  3:31 AM  Result Value Ref Range   Glucose-Capillary 79 65 - 99 mg/dL  CBC     Status: Abnormal   Collection Time: 10/13/17  3:33 AM  Result Value Ref Range   WBC 5.4 4.0 - 10.5 K/uL   RBC 2.44 (L) 3.87 - 5.11 MIL/uL   Hemoglobin 6.1 (LL) 12.0 - 15.0 g/dL    Comment: CRITICAL RESULT CALLED TO, READ BACK BY AND VERIFIED WITH: E,BLAYDS AT 0427 ON 10/13/17 BY A,MOHAMED    HCT 19.7 (L) 36.0 - 46.0 %   MCV 80.7 78.0 - 100.0 fL   MCH 25.0 (L) 26.0 - 34.0 pg   MCHC 31.0 30.0 - 36.0 g/dL   RDW 20.2 (H) 11.5 - 15.5 %   Platelets 144 (L) 150 - 400 K/uL    Comment: Performed at [facility], [place] [address] [place], [place] [address]  Comprehensive metabolic panel     Status: Abnormal   Collection Time: 10/13/17  3:33 AM  Result Value Ref Range   Sodium 139 135 - 145 mmol/L   Potassium 3.7 3.5 - 5.1 mmol/L    Comment: DELTA CHECK NOTED   Chloride 106 101 - 111 mmol/L   CO2 25 22 - 32 mmol/L   Glucose, Bld 82 65 - 99 mg/dL   BUN 8 6 - 20 mg/dL   Creatinine, Ser 0.46 0.44 - 1.00 mg/dL   Calcium 7.5 (L) 8.9 - 10.3 mg/dL   Total Protein 6.4 (L) 6.5 - 8.1 g/dL   Albumin 2.3 (L) 3.5 - 5.0 g/dL   AST 44 (H) 15 - 41 U/L   ALT 54 14 - 54 U/L   Alkaline Phosphatase 163 (H) 38 - 126 U/L   Total Bilirubin 1.2 0.3 - 1.2 mg/dL   GFR calc non Af Amer >60 >60 mL/min   GFR calc Af Amer >60 >60 mL/min    Comment: (NOTE) The eGFR has been calculated using the CKD EPI equation. This calculation has not been validated in all  clinical situations. eGFR's persistently <60 mL/min signify possible  Chronic Kidney Disease.    Anion gap 8 5 - 15    Comment: Performed at [facility], [place] [address], [place], [place] [address]  Protime-INR     Status: Abnormal   Collection Time: 10/13/17  3:33 AM  Result Value Ref Range   Prothrombin Time 20.2 (H) 11.4 - 15.2 seconds   INR 1.74     Comment: Performed at [facility], [place] [address] [place], [place] [address]  Type and screen [place]     Status: None (Preliminary result)   Collection Time: 10/13/17  5:58 AM  Result Value Ref Range   ABO/RH(D) A POS    Antibody Screen NEG    Sample Expiration 10/16/2017    Unit Number [id number]    Blood Component Type RED CELLS,LR    Unit division 00    Status of Unit ALLOCATED    Transfusion Status OK TO TRANSFUSE    Crossmatch Result      Compatible Performed at [facility], [place] [address], [place], [place] [address]    Unit Number [id number]    Blood Component Type RED CELLS,LR    Unit division 00    Status of Unit ALLOCATED    Transfusion Status OK TO TRANSFUSE    Crossmatch Result Compatible   Prepare RBC     Status: None   Collection Time: 10/13/17  5:58 AM  Result Value Ref Range   Order Confirmation      ORDER PROCESSED BY BLOOD BANK Performed at [facility], [place] [address], [place],  [address]   ABO/Rh     Status: None   Collection Time: 10/13/17  6:00 AM  Result Value Ref Range   ABO/RH(D)      A POS Performed at [facility], [place] [address] [place], [place] [address]   Glucose, capillary     Status: None   Collection Time: 10/13/17  7:33 AM  Result Value Ref Range   Glucose-Capillary 71 65 - 99 mg/dL    Imaging / Studies: Nm Hepatobiliary Liver Func  Result Date: 10/12/2017 CLINICAL DATA:  Right upper quadrant abdominal pain. EXAM: NUCLEAR MEDICINE HEPATOBILIARY IMAGING TECHNIQUE: Sequential images of the abdomen were obtained out to  60 minutes following intravenous administration of radiopharmaceutical. RADIOPHARMACEUTICALS:  5.4 mCi Tc-33m Choletec IV COMPARISON:  CT scan of Sep 02, 2016. FINDINGS: Prompt uptake and biliary excretion of activity by the liver is seen. Gallbladder activity is visualized, consistent with patency of cystic duct. Biliary activity passes into small bowel, consistent with patent common bile duct. IMPRESSION: Normal uptake within the gallbladder.  No evidence of cholecystitis. Electronically Signed   By: JMarijo Conception M.D.   On: 10/12/2017 19:41   Dg Chest Portable 1 View  Result Date: 10/12/2017 CLINICAL DATA:  Nausea, vomiting and diarrhea beginning yesterday. History of breast cancer. Patient on ciprofloxacin for UTI over the past week. EXAM: PORTABLE CHEST 1 VIEW COMPARISON:  09/04/2017 FINDINGS: Left subclavian Port-A-Cath has tip just below the cavoatrial junction. Lungs are hypoinflated without focal airspace consolidation or effusion. Cardiomediastinal silhouette and remainder of the exam is unchanged. IMPRESSION: Hypoinflation without acute cardiopulmonary disease. Electronically Signed   By: DMarin OlpM.D.   On: 10/12/2017 07:13   UKoreaAbdomen Limited Ruq  Result Date: 10/12/2017 CLINICAL DATA:  Chest pain with nausea and vomiting 1 week. Patient [redacted] weeks pregnant.  Metastatic breast cancer. EXAM: ULTRASOUND ABDOMEN LIMITED RIGHT UPPER QUADRANT COMPARISON:  CT 09/02/2017 FINDINGS: Gallbladder: There is a mild amount of gallbladder sludge present. No definite gallstones. There is mild edematous gallbladder wall thickening measuring 5.9 mm. Negative sonographic Murphy's sign. No adjacent free fluid. Common bile duct: Diameter: 4.3 mm. Liver: Mild heterogeneous parenchymal pattern with 6.1 cm oval hypoechoic mass likely representing patient's known metastatic liver disease. Portal vein is patent on color Doppler imaging with normal direction of blood flow towards the liver. IMPRESSION: Mild  gallbladder sludge with mild edematous gallbladder wall thickening. Recommend clinical correlation for acute cholecystitis. Heterogeneous hepatic parenchymal pattern with 6.1 cm hypoechoic mass present. This is likely due to patient's known metastatic breast cancer. Electronically Signed   By: Marin Olp M.D.   On: 10/12/2017 09:55    Medications / Allergies: per chart  Antibiotics: Anti-infectives (From admission, onward)   Start     Dose/Rate Route Frequency Ordered Stop   10/12/17 1400  piperacillin-tazobactam (ZOSYN) IVPB 3.375 g     3.375 g 12.5 mL/hr over 240 Minutes Intravenous Every 8 hours 10/12/17 1100     10/12/17 0700  vancomycin (VANCOCIN) 1,500 mg in sodium chloride 0.9 % 500 mL IVPB     1,500 mg 250 mL/hr over 120 Minutes Intravenous  Once 10/12/17 0651 10/12/17 1000   10/12/17 0645  piperacillin-tazobactam (ZOSYN) IVPB 3.375 g     3.375 g 100 mL/hr over 30 Minutes Intravenous  Once 10/12/17 0644 10/12/17 0740   10/12/17 0645  vancomycin (VANCOCIN) IVPB 1000 mg/200 mL premix  Status:  Discontinued     1,000 mg 200 mL/hr over 60 Minutes Intravenous  Once 10/12/17 6701 10/12/17 0650        Note: Portions of this report may have been transcribed using voice recognition software. Every effort was made to ensure accuracy; however, inadvertent computerized transcription errors may be present.   Any transcriptional errors that result from this process are unintentional.     Adin Hector, MD, FACS, MASCRS Gastrointestinal and Minimally Invasive Surgery    [address] [place],  [address] [phone] Main / Paging [phone] Fax

## 2017-10-14 ENCOUNTER — Ambulatory Visit
Admit: 2017-10-14 | Discharge: 2017-10-14 | Disposition: A | Discharge status: Home / Self Care | Payer: Medicare Other | Attending: Radiation Oncology | Admitting: Radiation Oncology

## 2017-10-14 DIAGNOSIS — C799 Secondary malignant neoplasm of unspecified site: Secondary | ICD-10-CM

## 2017-10-14 DIAGNOSIS — R590 Localized enlarged lymph nodes: Secondary | ICD-10-CM

## 2017-10-14 DIAGNOSIS — C787 Secondary malignant neoplasm of liver and intrahepatic bile duct: Secondary | ICD-10-CM

## 2017-10-14 DIAGNOSIS — Z923 Personal history of irradiation: Secondary | ICD-10-CM

## 2017-10-14 DIAGNOSIS — Z9221 Personal history of antineoplastic chemotherapy: Secondary | ICD-10-CM

## 2017-10-14 LAB — BPAM RBC
Blood Product Expiration Date: 201907132359
Blood Product Expiration Date: 201907132359
ISSUE DATE / TIME: 201906231118
ISSUE DATE / TIME: 201906231503
Unit Type and Rh: 6200
Unit Type and Rh: 6200

## 2017-10-14 LAB — TYPE AND SCREEN
ABO/RH(D): A POS
Antibody Screen: NEGATIVE
Unit division: 0
Unit division: 0

## 2017-10-14 LAB — CBC WITH DIFFERENTIAL/PLATELET
Basophils Absolute: 0 10*3/uL (ref 0.0–0.1)
Basophils Relative: 0 %
EOS PCT: 1 %
Eosinophils Absolute: 0 10*3/uL (ref 0.0–0.7)
HEMATOCRIT: 27.1 % — AB (ref 36.0–46.0)
HEMOGLOBIN: 8.8 g/dL — AB (ref 12.0–15.0)
LYMPHS ABS: 0.8 10*3/uL (ref 0.7–4.0)
LYMPHS PCT: 12 %
MCH: 26.4 pg (ref 26.0–34.0)
MCHC: 32.5 g/dL (ref 30.0–36.0)
MCV: 81.4 fL (ref 78.0–100.0)
MONO ABS: 0.8 10*3/uL (ref 0.1–1.0)
MONOS PCT: 12 %
Neutro Abs: 4.6 10*3/uL (ref 1.7–7.7)
Neutrophils Relative %: 75 %
Platelets: 109 10*3/uL — ABNORMAL LOW (ref 150–400)
RBC: 3.33 MIL/uL — AB (ref 3.87–5.11)
RDW: 18.7 % — ABNORMAL HIGH (ref 11.5–15.5)
WBC: 6.1 10*3/uL (ref 4.0–10.5)

## 2017-10-14 LAB — COMPREHENSIVE METABOLIC PANEL
ALBUMIN: 2.4 g/dL — AB (ref 3.5–5.0)
ALK PHOS: 187 U/L — AB (ref 38–126)
ALT: 46 U/L (ref 14–54)
ANION GAP: 10 (ref 5–15)
AST: 46 U/L — ABNORMAL HIGH (ref 15–41)
BILIRUBIN TOTAL: 1.3 mg/dL — AB (ref 0.3–1.2)
BUN: 6 mg/dL (ref 6–20)
CALCIUM: 7.8 mg/dL — AB (ref 8.9–10.3)
CO2: 23 mmol/L (ref 22–32)
Chloride: 103 mmol/L (ref 101–111)
Creatinine, Ser: 0.41 mg/dL — ABNORMAL LOW (ref 0.44–1.00)
GFR calc Af Amer: >60 mL/min (ref >60)
GFR calc non Af Amer: >60 mL/min (ref >60)
GLUCOSE: 106 mg/dL — AB (ref 65–99)
Potassium: 3.9 mmol/L (ref 3.5–5.1)
Sodium: 136 mmol/L (ref 135–145)
TOTAL PROTEIN: 7 g/dL (ref 6.5–8.1)

## 2017-10-14 LAB — URINE CULTURE

## 2017-10-14 LAB — GLUCOSE, CAPILLARY
GLUCOSE-CAPILLARY: 215 mg/dL — AB (ref 65–99)
GLUCOSE-CAPILLARY: 301 mg/dL — AB (ref 65–99)
Glucose-Capillary: 97 mg/dL (ref 65–99)

## 2017-10-14 LAB — CK: Total CK: 68 U/L (ref 38–234)

## 2017-10-14 MED ORDER — OXYCODONE-ACETAMINOPHEN 5-325 MG PO TABS
1.0000 | ORAL_TABLET | ORAL | Status: DC | PRN
  Administered 2017-10-14 – 2017-10-15 (×3): 1 via ORAL
  Filled 2017-10-14 (×4): qty 1

## 2017-10-14 MED ORDER — CEPHALEXIN 500 MG PO CAPS
500.0000 mg | ORAL_CAPSULE | Freq: Two times a day (BID) | ORAL | Status: DC
  Administered 2017-10-14 – 2017-10-18 (×9): 500 mg via ORAL
  Filled 2017-10-14 (×10): qty 1

## 2017-10-14 MED ORDER — AZITHROMYCIN 250 MG PO TABS
500.0000 mg | ORAL_TABLET | Freq: Every day | ORAL | Status: DC
  Administered 2017-10-14: 500 mg via ORAL
  Filled 2017-10-14: qty 2

## 2017-10-14 MED ORDER — DOXYCYCLINE HYCLATE 100 MG PO TABS
100.0000 mg | ORAL_TABLET | Freq: Two times a day (BID) | ORAL | Status: DC
  Administered 2017-10-14 – 2017-10-18 (×9): 100 mg via ORAL
  Filled 2017-10-14 (×10): qty 1

## 2017-10-14 MED ORDER — DEXAMETHASONE 4 MG PO TABS
4.0000 mg | ORAL_TABLET | Freq: Two times a day (BID) | ORAL | Status: DC
  Administered 2017-10-14 – 2017-10-18 (×8): 4 mg via ORAL
  Filled 2017-10-14 (×9): qty 1

## 2017-10-14 MED ORDER — DEXAMETHASONE SODIUM PHOSPHATE 10 MG/ML IJ SOLN
10.0000 mg | Freq: Once | INTRAMUSCULAR | Status: AC
  Administered 2017-10-14: 10 mg via INTRAVENOUS
  Filled 2017-10-14: qty 1

## 2017-10-14 NOTE — Progress Notes (Signed)
[facility] Progress Note     Subjective: CC:  Persistent R chest wall pain. Having bowel function. Denies abdominal pain. Daughter in bed with her.  Objective: Vital signs in last 24 hours: Temp:  [98.6 F (37 C)-100.1 F (37.8 C)] 98.6 F (37 C) (06/24 0526) Pulse Rate:  [113-124] 113 (06/24 0526) Resp:  [16-18] 17 (06/24 0526) BP: (124-144)/(78-115) 135/95 (06/24 0526) SpO2:  [92 %-100 %] 92 % (06/24 0526) Last BM Date: 10/12/17  Intake/Output from previous day: 06/23 0701 - 06/24 0700 In: 4445 [P.O.:560; I.V.:2961.7; Blood:696; IV Piggyback:227.3] Out: 0  Intake/Output this shift: Total I/O In: 10 [I.V.:10] Out: -   PE: Gen:  Alert, NAD, pleasant and cooperative  Card:  Regular rate and rhythm, pedal pulses 2+ BL   Pulm:  Normal effort, clear to auscultation w/ diminished breath sounds BL lung bases Abd: Soft, non-tender, mind distention, +BS  Skin: warm and dry, no rashes  Psych: A&Ox3   Lab Results:  Recent Labs    10/13/17 0333 10/13/17 2233  WBC 5.4 7.0  HGB 6.1* 9.9*  HCT 19.7* 30.3*  PLT 144* 150   BMET Recent Labs    10/12/17 0603 10/13/17 0333  NA 134* 139  K 4.5 3.7  CL 99* 106  CO2 21* 25  GLUCOSE 125* 82  BUN 12 8  CREATININE 0.51 0.46  CALCIUM 8.5* 7.5*   PT/INR Recent Labs    10/12/17 0603 10/13/17 0333  LABPROT 40.4* 20.2*  INR 4.23* 1.74   CMP     Component Value Date/Time   NA 139 10/13/2017 0333   NA 139 04/09/2017 0950   K 3.7 10/13/2017 0333   K 3.3 (L) 04/09/2017 0950   CL 106 10/13/2017 0333   CO2 25 10/13/2017 0333   CO2 25 04/09/2017 0950   GLUCOSE 82 10/13/2017 0333   GLUCOSE 213 (H) 04/09/2017 0950   BUN 8 10/13/2017 0333   BUN 14.9 04/09/2017 0950   CREATININE 0.46 10/13/2017 0333   CREATININE 0.63 09/24/2017 1345   CREATININE 0.9 04/09/2017 0950   CALCIUM 7.5 (L) 10/13/2017 0333   CALCIUM 9.0 04/09/2017 0950   PROT 6.4 (L) 10/13/2017 0333   PROT 7.1 04/09/2017 0950   ALBUMIN 2.3 (L)  10/13/2017 0333   ALBUMIN 3.6 04/09/2017 0950   AST 44 (H) 10/13/2017 0333   AST 50 (H) 09/24/2017 1345   AST 11 04/09/2017 0950   ALT 54 10/13/2017 0333   ALT 59 (H) 09/24/2017 1345   ALT 12 04/09/2017 0950   ALKPHOS 163 (H) 10/13/2017 0333   ALKPHOS 63 04/09/2017 0950   BILITOT 1.2 10/13/2017 0333   BILITOT 0.3 09/24/2017 1345   BILITOT 0.37 04/09/2017 0950   GFRNONAA >60 10/13/2017 0333   GFRNONAA >60 09/24/2017 1345   GFRAA >60 10/13/2017 0333   GFRAA >60 09/24/2017 1345   Lipase     Component Value Date/Time   LIPASE 18 10/12/2017 1242       Studies/Results: Ct Angio Chest Pe W Or Wo Contrast  Result Date: 10/13/2017 CLINICAL DATA:  Recent diagnosis of pulmonary embolism. History of metastatic breast cancer. Evaluate for acute or chronic PE. EXAM: CT ANGIOGRAPHY CHEST WITH CONTRAST TECHNIQUE: Multidetector CT imaging of the chest was performed using the standard protocol during bolus administration of intravenous contrast. Multiplanar CT image reconstructions and MIPs were obtained to evaluate the vascular anatomy. CONTRAST:  169mL ISOVUE-370 IOPAMIDOL (ISOVUE-370) INJECTION 76% COMPARISON:  Chest CTA - 09/04/2017 FINDINGS: Vascular Findings: There is  adequate opacification of the pulmonary arterial system with the main pulmonary artery measuring 277 Hounsfield units. No evidence of acute or chronic pulmonary embolism special attention paid to the right lower lobe segmental and subsegmental pulmonary arteries at location of previously identified residual pulmonary embolism. Normal caliber the main pulmonary artery. Normal heart size.  No pericardial effusion. Normal caliber of the thoracic aorta. No definite evidence of thoracic aortic dissection or periaortic stranding on this nongated examination. Conventional configuration of the aortic arch. The branch vessels of the aortic arch appear widely patent throughout their imaged course. Left subclavian vein approach port a catheter  tip terminates within the superior cavoatrial junction. Review of the MIP images confirms the above findings. ---------------------------------------------------------------------------------- Nonvascular Findings: Mediastinum/Lymph Nodes: Worsening mediastinal and bilateral hilar lymphadenopathy with index prevascular nodal conglomeration measuring approximately 1.3 cm in greatest short axis diameter (image 28, series 4, previously, 0.6 cm, index subcarinal nodal conglomeration now measuring approximately 3.5 x 1.7 cm (image 36), previously, 1.7 x 1.1 cm, index right suprahilar nodal conglomeration measuring 1.5 cm, previously, 0.9 cm and index left infrahilar nodal conglomeration now measuring 1 cm (image 36, series 4), previously, 0.3 cm. Lungs/Pleura: Interval development of a small right and trace left-sided pleural effusions. Worsening masslike consolidative opacities within the anterior subpleural aspects of the right upper and middle lobes (image 56, series 6). Interval development of multiple new bilateral pulmonary nodules with index nodule within the right upper lobe measuring 0.5 cm in greatest short axis diameter (image 39, series 6), index right middle lobe pulmonary nodule measuring 0.7 cm (image 60), index left lower lobe pulmonary nodule measuring 0.5 cm (image 520 and development of multiple punctate (sub 5 mm) pulmonary nodule within the bilateral lung apices. Upper abdomen: Progression of known hepatic metastatic disease with index lesion within the medial segment of the left lobe liver now measuring 3.7 cm in greatest short axis diameter (image 69, series 4, previously, 2.6 cm and index lesion within the subcapsular aspect of the anterior segment right lobe of liver measuring 2.6 cm (image 69), previously,2.0 cm. Musculoskeletal: Re demonstrated extensive osseous metastatic disease with multiple mixed lytic and sclerotic nodule seen throughout the thoracic spine and within the sternum. Unchanged  approximately 3.6 cm presumed seroma within the upper inner quadrant of the right breast. IMPRESSION: 1. No evidence of acute or chronic pulmonary embolism. 2. Findings compatible with rapid progression of metastatic disease within the chest with development of multiple bilateral pulmonary nodules and worsening mediastinal and hilar adenopathy. 3. Interval development of small right and trace left-sided pleural effusions. 4. Continued progression of known hepatic metastatic disease. 5. Grossly unchanged sequela of advanced osseous metastatic disease, primarily affecting the thoracic spine. Electronically Signed   By: Sandi Mariscal M.D.   On: 10/13/2017 13:15   Nm Hepatobiliary Liver Func  Result Date: 10/12/2017 CLINICAL DATA:  Right upper quadrant abdominal pain. EXAM: NUCLEAR MEDICINE HEPATOBILIARY IMAGING TECHNIQUE: Sequential images of the abdomen were obtained out to 60 minutes following intravenous administration of radiopharmaceutical. RADIOPHARMACEUTICALS:  5.4 mCi Tc-69m  Choletec IV COMPARISON:  CT scan of Sep 02, 2016. FINDINGS: Prompt uptake and biliary excretion of activity by the liver is seen. Gallbladder activity is visualized, consistent with patency of cystic duct. Biliary activity passes into small bowel, consistent with patent common bile duct. IMPRESSION: Normal uptake within the gallbladder.  No evidence of cholecystitis. Electronically Signed   By: Marijo Conception, M.D.   On: 10/12/2017 19:41   US Abdomen Limited Ruq  Result Date: 10/12/2017 CLINICAL DATA:  Chest pain with nausea and vomiting 1 week. Patient [redacted] weeks pregnant. Metastatic breast cancer. EXAM: ULTRASOUND ABDOMEN LIMITED RIGHT UPPER QUADRANT COMPARISON:  CT 09/02/2017 FINDINGS: Gallbladder: There is a mild amount of gallbladder sludge present. No definite gallstones. There is mild edematous gallbladder wall thickening measuring 5.9 mm. Negative sonographic Murphy's sign. No adjacent free fluid. Common bile duct: Diameter:  4.3 mm. Liver: Mild heterogeneous parenchymal pattern with 6.1 cm oval hypoechoic mass likely representing patient's known metastatic liver disease. Portal vein is patent on color Doppler imaging with normal direction of blood flow towards the liver. IMPRESSION: Mild gallbladder sludge with mild edematous gallbladder wall thickening. Recommend clinical correlation for acute cholecystitis. Heterogeneous hepatic parenchymal pattern with 6.1 cm hypoechoic mass present. This is likely due to patient's known metastatic breast cancer. Electronically Signed   By: Marin Olp M.D.   On: 10/12/2017 09:55    Anti-infectives: Anti-infectives (From admission, onward)   Start     Dose/Rate Route Frequency Ordered Stop   10/12/17 1400  piperacillin-tazobactam (ZOSYN) IVPB 3.375 g     3.375 g 12.5 mL/hr over 240 Minutes Intravenous Every 8 hours 10/12/17 1100     10/12/17 0700  vancomycin (VANCOCIN) 1,500 mg in sodium chloride 0.9 % 500 mL IVPB     1,500 mg 250 mL/hr over 120 Minutes Intravenous  Once 10/12/17 0651 10/12/17 1000   10/12/17 0645  piperacillin-tazobactam (ZOSYN) IVPB 3.375 g     3.375 g 100 mL/hr over 30 Minutes Intravenous  Once 10/12/17 0644 10/12/17 0740   10/12/17 0645  vancomycin (VANCOCIN) IVPB 1000 mg/200 mL premix  Status:  Discontinued     1,000 mg 200 mL/hr over 60 Minutes Intravenous  Once 10/12/17 0644 10/12/17 0650     Assessment/Plan Abdominal pain - R sided abd pain resolved, now localized to right lower chest wall. HIDA negative for GB dysfunction. CT PE negative for PE - significant for worsening metastatic cancer to lungs/liver.   No acute surgical needs - I discussed CT results with patient and explained HIDA results. We will sign off. Call as needed with questions/concerns    LOS: 2 days    Jill Alexanders , [facility] Surgery 10/14/2017, 8:51 AM Pager: [phone] Consults: [phone] Mon-Fri 7:00 am-4:30 pm Sat-Sun 7:00 am-11:30 am

## 2017-10-14 NOTE — Progress Notes (Signed)
   10/14/17 1453  Clinical Encounter Type  Visited With Patient  Visit Type Initial;Spiritual support  Referral From Patient;Nurse  Consult/Referral To Chaplain  Spiritual Encounters  Spiritual Needs Emotional;Prayer  Stress Factors  Patient Stress Factors Health changes   Responded to a [facility] for prayer.  Spent about an hour with this patient as she shared the news about what is going on with her cancer.  She shared she has been fighting since 2015 and has a spirit to continue to want to fight and live life each day.  She has a 49 year old daughter who seems to be a big support for her and a big reason to continue to live.  Patient seems to have a good support network with her mom and grandmother and others.  Patient shared a lot about her faith and her life journey towards finishing her college degree.  She expressed gratitude for the care she has received.  Will follow and support as needed. Chaplain Katherene Ponto

## 2017-10-14 NOTE — Consult Note (Addendum)
Radiation Oncology         [phone] ________________________________  Inpatient Re-Consultation  Name: Debbie Martinez MRN: [id number]  Date: 10/12/2017  DOB: November 13, 1975  QQ:[id number], Myra Rude, MD  No ref. 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 Sepsis, due to unspecified organism ([place]) A41.9   2. RUQ abdominal pain R10.11 US Abdomen Limited RUQ    US Abdomen Limited RUQ   CHIEF COMPLAINT: Here to discuss management of right upper abdomen pain and breast cancer  HISTORY OF PRESENT ILLNESS::Debbie Martinez is a 42 y.o. female well known by me who has not received radiotherapy since we completed treated to L2-S2 in 2017 who presented to the ED before this admission with various symptoms, including.  Right upper quadrant pain, nausea, vomiting, fever, chills, recent UTI improving on Cipro, tachycardia, fever.  She also reports that before admission she had been coping with radiating pain from her left hip to the medial left lower thigh, which was alleviated by her daughter's massage.  This pain had kept her from walking initially.  The patient also acknowledges that she had had some pain in her left upper abdomen but this then migrated to the right upper abdomen.  The pain in the right upper quadrant of her abdomen radiates to her upper right back.  She denies any significant spinal pain at this time.  She has had shortness of breath.  She reports that the pain in the right upper abdomen is worsened with deep breaths.  Work-up has included ultrasound of her abdomen and HIDA scan.  These were essentially negative for cholecystitis.  She does have numerous liver mets that have been known, and have been undergoing treatment with limited response through systemic therapy.  Dr. Payton Mccallum would like to continue carboplatin as he feels she has not yet had enough of this particular drug to to determine if her tumors  will respond.  CT angiogram of the chest, also reviewed by me, does not show any obvious rib metastases disease.  She has known stable thoracic metastases but no significant pathologic fracture.  She has numerous progressive liver metastases not well visualized on that scan.  She has some opacity in the anterior right lung which may be significant with some inflammatory process.  She has a modest right pleural effusion.  She has metastatic disease lungs as well as  mediastinum and hilar adenopathy.  No PE.  She reports her pain has been better since admission.  She is on dexamethasone as well as gabapentin.  She is receiving as needed narcotics.  She denies constipation currently.  She is able to walk.  Reports poor p.o. intake  PREVIOUS RADIATION THERAPY: Yes  Radiation treatment dates:   11/03/2014-12/14/2014 Site/dose:   1) Right Breast, supraclavicular nodes, axillary nodes, and internal mammary nodes / 50 Gy in 25 fractions 2) Right Breast Boost / 10 Gy in 5 fractions  Radiation treatment dates: 01-04-16 to 01-23-16: Site/dose:  L2-S2, 35 Gy in 14 fractions   PAST MEDICAL HISTORY:  has a past medical history of Anxiety, Breast cancer ([place]) (01/20/14), Diabetes, GERD (gastroesophageal reflux disease), History of radiation therapy (11/03/14- 12/14/14), History of radiation therapy (01/04/16- 01/23/16), Hypertension, Liver metastases ([place]), Metastatic cancer to bone ([place]), and Shortness of breath.    PAST SURGICAL HISTORY: Past Surgical History:  Procedure Laterality Date  . BREAST LUMPECTOMY WITH NEEDLE LOCALIZATION AND AXILLARY SENTINEL LYMPH NODE  BX Right 08/23/2014   Procedure: RIGHT BREAST LUMPECTOMY WITH NEEDLE LOCALIZATION(X'S 2)AND RIGHT AXILLARY SENTINEL LYMPH NODE Bi[facility]ies;  Surgeon: Autumn Messing III, MD;  Location: [facility];  Service: General;  Laterality: Right;  . CESAREAN SECTION     2008  . KELOID EXCISION Left    ear  . PORTACATH PLACEMENT Left 01/26/2014   Procedure: INSERTION PORT-A-CATH;   Surgeon: Autumn Messing III, MD;  Location: [facility];  Service: General;  Laterality: Left;  . RE-EXCISION OF BREAST CANCER,SUPERIOR MARGINS Right 09/15/2014   Procedure: RE-EXCISION OF RIGHT BREAST CANCER,SUPERIOR AND INFERIOR MARGINS;  Surgeon: Autumn Messing III, MD;  Location: [facility];  Service: General;  Laterality: Right;    FAMILY HISTORY: family history includes Dementia in her paternal grandmother; Heart attack (age of onset: 73) in her father; Leukemia (age of onset: 55) in her cousin; Lupus in her maternal aunt; Prostate cancer (age of onset: 19) in her maternal grandfather.  SOCIAL HISTORY:  reports that she has never smoked. She has never used smokeless tobacco. She reports that she drinks alcohol. She reports that she does not use drugs.  ALLERGIES: Lisinopril and Tamoxifen  MEDICATIONS:  Current Facility-Administered Medications  Medication Dose Route Frequency Provider Last Rate Last Dose  . acetaminophen (TYLENOL) tablet 650 mg  650 mg Oral Q6H PRN Dessa Phi, DO   650 mg at 10/13/17 0023   Or  . acetaminophen (TYLENOL) suppository 650 mg  650 mg Rectal Q6H PRN Dessa Phi, DO      . benzonatate (TESSALON) capsule 100 mg  100 mg Oral TID PRN Vertis Kelch, NP   100 mg at 10/13/17 0023  . cephALEXin (KEFLEX) capsule 500 mg  500 mg Oral Q12H Lavina Hamman, MD      . dexamethasone (DECADRON) tablet 4 mg  4 mg Oral BID Lavina Hamman, MD      . dextrose 5 %-0.9 % sodium chloride infusion   Intravenous Continuous Bodenheimer, Charles A, NP 100 mL/hr at 10/13/17 2112    . doxycycline (VIBRA-TABS) tablet 100 mg  100 mg Oral Q12H Lavina Hamman, MD   100 mg at 10/14/17 1159  . gabapentin (NEURONTIN) capsule 300 mg  300 mg Oral TID Lavina Hamman, MD   300 mg at 10/14/17 0819  . insulin aspart (novoLOG) injection 0-9 Units  0-9 Units Subcutaneous TID WC Bodenheimer, Charles A, NP      . irbesartan (AVAPRO) tablet 75 mg  75 mg Oral Daily Dessa Phi, DO   75  mg at 10/14/17 0819  . LORazepam (ATIVAN) tablet 0.5 mg  0.5 mg Oral QHS PRN Dessa Phi, DO      . methocarbamol (ROBAXIN) tablet 500 mg  500 mg Oral TID Lavina Hamman, MD   500 mg at 10/14/17 0819  . morphine 2 MG/ML injection 2-4 mg  2-4 mg Intravenous Q2H PRN Lavina Hamman, MD   2 mg at 10/14/17 1357  . ondansetron (ZOFRAN) tablet 4 mg  4 mg Oral Q6H PRN Dessa Phi, DO       Or  . ondansetron [facility]) injection 4 mg  4 mg Intravenous Q6H PRN Dessa Phi, DO   4 mg at 10/14/17 1159  . oxyCODONE-acetaminophen (PERCOCET/ROXICET) 5-325 MG per tablet 1 tablet  1 tablet Oral Q4H PRN Lavina Hamman, MD   1 tablet at 10/14/17 1151  . rivaroxaban (XARELTO) tablet 20 mg  20 mg Oral Q supper Emiliano Dyer, [facility]      .  sodium chloride flush (NS) 0.9 % injection 10-40 mL  10-40 mL Intracatheter Q12H Dessa Phi, DO      . sodium chloride flush (NS) 0.9 % injection 10-40 mL  10-40 mL Intracatheter PRN Dessa Phi, DO   10 mL at 10/14/17 0845    REVIEW OF SYSTEMS:  Notable for that above.   PHYSICAL EXAM:  height is 5\' 7"  (1.702 m) and weight is 155 lb (70.3 kg). Her oral temperature is 98.5 F (36.9 C). Her blood pressure is 144/94 (abnormal) and her pulse is 100. Her respiration is 18 and oxygen saturation is 99%.   General: Alert and oriented, in no acute distress Heart: Regular in rate and rhythm with no murmurs, rubs, or gallops. Chest: Clear to auscultation bilaterally, with no rhonchi, wheezes, or rales. Abdomen: Right upper quadrant tenderness to moderate palpation.  She is also tender to moderate palpation of the right flank /chest wall  Extremities: No cyanosis or edema in lower extremities. Skin: No concerning lesions.  No lesions suggestive of rash or shingles on torso Musculoskeletal: She is able to move both lower extremities and upper extremities symmetrically.  The patient feels that the pain to palpation is deep in her abdomen and not on the rib cage.  She does  not have any significant tenderness to palpation in the thoracic spine neurologic: Cranial nerves II through XII are grossly intact. No obvious focalities. Speech is fluent.  Psychiatric: Judgment and insight are intact. Affect is appropriate.   ECOG = 2  0 - Asymptomatic (Fully active, able to carry on all predisease activities without restriction)  1 - Symptomatic but completely ambulatory (Restricted in physically strenuous activity but ambulatory and able to carry out work of a light or sedentary nature. For example, light housework, office work)  2 - Symptomatic, <50% in bed during the day (Ambulatory and capable of all self care but unable to carry out any work activities. Up and about more than 50% of waking hours)  3 - Symptomatic, >50% in bed, but not bedbound (Capable of only limited self-care, confined to bed or chair 50% or more of waking hours)  4 - Bedbound (Completely disabled. Cannot carry on any self-care. Totally confined to bed or chair)  5 - Death   Eustace Pen MM, Creech RH, Tormey DC, et al. [id number]). "Toxicity and response criteria of the [facility] Group". [place] Oncol. 5 (6): 649-55   LABORATORY DATA:  Lab Results  Component Value Date   WBC 6.1 10/14/2017   HGB 8.8 (L) 10/14/2017   HCT 27.1 (L) 10/14/2017   MCV 81.4 10/14/2017   PLT 109 (L) 10/14/2017   CMP     Component Value Date/Time   NA 136 10/14/2017 0842   NA 139 04/09/2017 0950   K 3.9 10/14/2017 0842   K 3.3 (L) 04/09/2017 0950   CL 103 10/14/2017 0842   CO2 23 10/14/2017 0842   CO2 25 04/09/2017 0950   GLUCOSE 106 (H) 10/14/2017 0842   GLUCOSE 213 (H) 04/09/2017 0950   BUN 6 10/14/2017 0842   BUN 14.9 04/09/2017 0950   CREATININE 0.41 (L) 10/14/2017 0842   CREATININE 0.63 09/24/2017 1345   CREATININE 0.9 04/09/2017 0950   CALCIUM 7.8 (L) 10/14/2017 0842   CALCIUM 9.0 04/09/2017 0950   PROT 7.0 10/14/2017 0842   PROT 7.1 04/09/2017 0950   ALBUMIN 2.4 (L) 10/14/2017  0842   ALBUMIN 3.6 04/09/2017 0950   AST 46 (H) 10/14/2017 7782  AST 50 (H) 09/24/2017 1345   AST 11 04/09/2017 0950   ALT 46 10/14/2017 0842   ALT 59 (H) 09/24/2017 1345   ALT 12 04/09/2017 0950   ALKPHOS 187 (H) 10/14/2017 0842   ALKPHOS 63 04/09/2017 0950   BILITOT 1.3 (H) 10/14/2017 0842   BILITOT 0.3 09/24/2017 1345   BILITOT 0.37 04/09/2017 0950   GFRNONAA >60 10/14/2017 0842   GFRNONAA >60 09/24/2017 1345   GFRAA >60 10/14/2017 0842   GFRAA >60 09/24/2017 1345         RADIOGRAPHY: Dg Lumbar Spine 2-3 Views  Result Date: 09/17/2017 CLINICAL DATA:  Metastatic breast cancer. Low back pain extending into left lower extremity. EXAM: LUMBAR SPINE - 2-3 VIEW COMPARISON:  CT abdomen and pelvis 09/02/2017. Whole-body bone scan 06/06/2017. FINDINGS: A pathologic fracture at L4 is not significantly changed. There is slight retropulsion of bone at L4 and retrolisthesis at L4-5. Additional lytic and sclerotic lesions are present throughout the lumbar spine. Vertebral body heights are otherwise maintained. Alignment is stable. Lytic lesions are noted in the pelvis. IMPRESSION: 1. Stable pathologic fracture at L4-5 with slight retropulsion of bone and retrolisthesis at L4-5. 2. Unchanged widespread metastatic disease throughout the visualized skeleton. 3. No other acute fractures are present. Electronically Signed   By: San Morelle M.D.   On: 09/17/2017 14:56   Ct Angio Chest Pe W Or Wo Contrast  Result Date: 10/13/2017 CLINICAL DATA:  Recent diagnosis of pulmonary embolism. History of metastatic breast cancer. Evaluate for acute or chronic PE. EXAM: CT ANGIOGRAPHY CHEST WITH CONTRAST TECHNIQUE: Multidetector CT imaging of the chest was performed using the standard protocol during bolus administration of intravenous contrast. Multiplanar CT image reconstructions and MIPs were obtained to evaluate the vascular anatomy. CONTRAST:  145mL ISOVUE-370 IOPAMIDOL (ISOVUE-370) INJECTION 76%  COMPARISON:  Chest CTA - 09/04/2017 FINDINGS: Vascular Findings: There is adequate opacification of the pulmonary arterial system with the main pulmonary artery measuring 277 Hounsfield units. No evidence of acute or chronic pulmonary embolism special attention paid to the right lower lobe segmental and subsegmental pulmonary arteries at location of previously identified residual pulmonary embolism. Normal caliber the main pulmonary artery. Normal heart size.  No pericardial effusion. Normal caliber of the thoracic aorta. No definite evidence of thoracic aortic dissection or periaortic stranding on this nongated examination. Conventional configuration of the aortic arch. The branch vessels of the aortic arch appear widely patent throughout their imaged course. Left subclavian vein approach port a catheter tip terminates within the superior cavoatrial junction. Review of the MIP images confirms the above findings. ---------------------------------------------------------------------------------- Nonvascular Findings: Mediastinum/Lymph Nodes: Worsening mediastinal and bilateral hilar lymphadenopathy with index prevascular nodal conglomeration measuring approximately 1.3 cm in greatest short axis diameter (image 28, series 4, previously, 0.6 cm, index subcarinal nodal conglomeration now measuring approximately 3.5 x 1.7 cm (image 36), previously, 1.7 x 1.1 cm, index right suprahilar nodal conglomeration measuring 1.5 cm, previously, 0.9 cm and index left infrahilar nodal conglomeration now measuring 1 cm (image 36, series 4), previously, 0.3 cm. Lungs/Pleura: Interval development of a small right and trace left-sided pleural effusions. Worsening masslike consolidative opacities within the anterior subpleural aspects of the right upper and middle lobes (image 56, series 6). Interval development of multiple new bilateral pulmonary nodules with index nodule within the right upper lobe measuring 0.5 cm in greatest short  axis diameter (image 39, series 6), index right middle lobe pulmonary nodule measuring 0.7 cm (image 60), index left lower lobe pulmonary nodule measuring 0.5  cm (image 520 and development of multiple punctate (sub 5 mm) pulmonary nodule within the bilateral lung apices. Upper abdomen: Progression of known hepatic metastatic disease with index lesion within the medial segment of the left lobe liver now measuring 3.7 cm in greatest short axis diameter (image 69, series 4, previously, 2.6 cm and index lesion within the subcapsular aspect of the anterior segment right lobe of liver measuring 2.6 cm (image 69), previously,2.0 cm. Musculoskeletal: Re demonstrated extensive osseous metastatic disease with multiple mixed lytic and sclerotic nodule seen throughout the thoracic spine and within the sternum. Unchanged approximately 3.6 cm presumed seroma within the upper inner quadrant of the right breast. IMPRESSION: 1. No evidence of acute or chronic pulmonary embolism. 2. Findings compatible with rapid progression of metastatic disease within the chest with development of multiple bilateral pulmonary nodules and worsening mediastinal and hilar adenopathy. 3. Interval development of small right and trace left-sided pleural effusions. 4. Continued progression of known hepatic metastatic disease. 5. Grossly unchanged sequela of advanced osseous metastatic disease, primarily affecting the thoracic spine. Electronically Signed   By: Sandi Mariscal M.D.   On: 10/13/2017 13:15   Nm Hepatobiliary Liver Func  Result Date: 10/12/2017 CLINICAL DATA:  Right upper quadrant abdominal pain. EXAM: NUCLEAR MEDICINE HEPATOBILIARY IMAGING TECHNIQUE: Sequential images of the abdomen were obtained out to 60 minutes following intravenous administration of radiopharmaceutical. RADIOPHARMACEUTICALS:  5.4 mCi Tc-22m  Choletec IV COMPARISON:  CT scan of Sep 02, 2016. FINDINGS: Prompt uptake and biliary excretion of activity by the liver is seen.  Gallbladder activity is visualized, consistent with patency of cystic duct. Biliary activity passes into small bowel, consistent with patent common bile duct. IMPRESSION: Normal uptake within the gallbladder.  No evidence of cholecystitis. Electronically Signed   By: Marijo Conception, M.D.   On: 10/12/2017 19:41   Dg Chest Portable 1 View  Result Date: 10/12/2017 CLINICAL DATA:  Nausea, vomiting and diarrhea beginning yesterday. History of breast cancer. Patient on ciprofloxacin for UTI over the past week. EXAM: PORTABLE CHEST 1 VIEW COMPARISON:  09/04/2017 FINDINGS: Left subclavian Port-A-Cath has tip just below the cavoatrial junction. Lungs are hypoinflated without focal airspace consolidation or effusion. Cardiomediastinal silhouette and remainder of the exam is unchanged. IMPRESSION: Hypoinflation without acute cardiopulmonary disease. Electronically Signed   By: Marin Olp M.D.   On: 10/12/2017 07:13   Dg Hip Unilat W Or W/o Pelvis 1 View Left  Result Date: 09/17/2017 CLINICAL DATA:  Metastatic breast cancer. Low back pain extending into left lower extremity. EXAM: DG HIP (WITH OR WITHOUT PELVIS) 1V*L* COMPARISON:  CT abdomen and pelvis 09/02/2017. FINDINGS: Extensive sclerotic and lytic metastatic disease is present in the pelvis and proximal femurs. Femur involvement is more prominent right than left. No acute or healing fractures are present. IMPRESSION: Diffuse metastatic disease without acute or healing fractures. Electronically Signed   By: San Morelle M.D.   On: 09/17/2017 15:02   US Abdomen Limited Ruq  Result Date: 10/12/2017 CLINICAL DATA:  Chest pain with nausea and vomiting 1 week. Patient [redacted] weeks pregnant. Metastatic breast cancer. EXAM: ULTRASOUND ABDOMEN LIMITED RIGHT UPPER QUADRANT COMPARISON:  CT 09/02/2017 FINDINGS: Gallbladder: There is a mild amount of gallbladder sludge present. No definite gallstones. There is mild edematous gallbladder wall thickening measuring 5.9  mm. Negative sonographic Murphy's sign. No adjacent free fluid. Common bile duct: Diameter: 4.3 mm. Liver: Mild heterogeneous parenchymal pattern with 6.1 cm oval hypoechoic mass likely representing patient's known metastatic liver disease. Portal vein  is patent on color Doppler imaging with normal direction of blood flow towards the liver. IMPRESSION: Mild gallbladder sludge with mild edematous gallbladder wall thickening. Recommend clinical correlation for acute cholecystitis. Heterogeneous hepatic parenchymal pattern with 6.1 cm hypoechoic mass present. This is likely due to patient's known metastatic breast cancer. Electronically Signed   By: Marin Olp M.D.   On: 10/12/2017 09:55      IMPRESSION/PLAN: Widespread metastatic breast cancer with significant pain in the right upper quadrant of the abdomen; this is the dominant symptom that is affecting her quality of life right now.  Patient will be discussed at palliative care conference tomorrow.  Radiotherapy can certainly be considered for palliative intent if the board agrees that liver metastases are the most likely cause of her symptoms (I would tend to attribute her pain to this).  If she undergoes radiotherapy for her liver metastases with palliative intent, I would recommend 21 Gy in 7 fractions to the whole liver  Addendum: on 10/15/2017 after palliative care conference  I spoke with Dr. Payton Mccallum who attended that tumor board.  Our consensus is that her pain is from her liver metastases and we are in agreement to proceed with palliative radiotherapy.  She will be simulated today and we will try to start her radiotherapy tomorrow.  He may continue carboplatin simultaneously  I spent over 25 minutes face to face with the patient, on the floor, during this encounter, and over 50% of that time was devoted to counseling and care coordination.  __________________________________________   [name], MD

## 2017-10-14 NOTE — Progress Notes (Signed)
HEMATOLOGY-ONCOLOGY PROGRESS NOTE  SUBJECTIVE: Patient is having a lot of abdominal pain in the right upper quadrant.  She has been taking oral Percocet and IV morphine.  CT scans done in the hospital showed capsular stretching from metastatic tumor in the liver.  The scans showed progression of disease but she had only received 1 dose of carboplatin so at this time, it is very hard to know whether she will benefit from carboplatin or not.  OBJECTIVE:     Breast cancer of upper-inner quadrant of right female breast ([place])   01/12/2014 Mammogram    Right breast - two lesions: #1 2:00 position 1.6 x 1.9 cm and #2 12:00 position 0.5 x 0.9 cm. Distance between the 2 was 4.5 cm, right axillary lymph node enlargement      01/12/2014 Initial Biopsy    Right breast 3 biopsies: All were IDC with DCIS ER+ PR + Ki-67 85% HER-2 negative: Right axillary lymph node also positive for metastatic cancer ER positive HER-2 negative Ki-67 80% grade 2      01/18/2014 Breast MRI    Right breast 12:00 position 2.8 x 2 x 2.2 cm: 2:00 position 1.3 x 0.8 x 0.5 cm, right axilla multiple enlarged lymph nodes 1 cm in size, right retropectoral lymph node 0.6 cm      01/18/2014 Clinical Stage    Stage IIB T2 N1      01/25/2014 Procedure    Breast/Ovarian ([facility]) reveals no clinically significant variant at ATM, BARD1, BRCA1, BRCA2, BRIP1, CDH1, CHEK2, EPCAM, FANCC, MLH1, MSH2, MSH6, NBN, PALB2, PMS2, PTEN, RAD51C, RAD51D, TP53, and XRCC2.       02/11/2014 - 07/01/2014 Neo-Adjuvant Chemotherapy    Doxorubicin and cyclophosphamide X 4 followed by Taxol weekly x12       07/12/2014 Breast MRI    Partial response to neoadjuvant chemotherapy right breast mass decreased from 2.8 cm to 2.3 cm, 2 small masses along the medial margin also decreased; significant decrease right breast mass 11 to 12:00 and 13 mm to 8 mm, decrease in right axilla LN      08/23/2014 Definitive Surgery    Right lumpectomy/SLNB [name]) 2:00: IDC grade  3, 3.2 cm, high-grade DCIS, LV I present, PNI present,; 12:00: IDC grade 30.8 cm, high-grade DCIS, LV I, 4/4 lymph nodes positive with ECE, ER 90%, PR 40%, HER-2 negative margin positive      08/23/2014 Pathologic Stage    Stage IIIA: ypT2 ypN2a      09/15/2014 Surgery    Reexcision of margins: clear; no evidence of malignancy      11/03/2014 - 12/14/2014 Radiation Therapy    Adjuvant XRT Isidore Moos): Right Breast, supraclavicular nodes, axillary nodes, and internal mammary nodes: 50 Gy over 25 fractions; right breast boost: 10 Gy over 5 fractions. Total dose: 60 Gy      12/29/2014 -  Anti-estrogen oral therapy    Tamoxifen 20 mg daily      04/08/2015 Survivorship    Survivorship care plan completed and mailed to patient in lieu of in person visit at her request      12/31/2015 Relapse/Recurrence    Innumerable osseous metastases with acute pathologic fracture the L4 vertebral body. 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 L5-S1 protrusion with left more than right S1 impingement      01/05/2016 - 01/12/2016 Radiation Therapy    Palliative radiation to the spine for cord compression      01/13/2016 -  Anti-estrogen oral therapy  Ribociclib 3 weeks on and 1 week off and Letrozole (with Zolodex until oophorectomy) and Xgeva for bone mets      06/17/2017 Pathology Results    Liver biopsy: Metastatic carcinoma: positive for cytokeratin 7, GCDFP, and GATA-3 but negative for cytokeratin 20. The immunoprofile is consistent with metastasis from the patient's known breast carcinoma.  ER 0%, PR 0%, Ki-67 20%, HER-2 negative ratio 1.23      07/02/2017 - 08/20/2017 Chemotherapy    [place] day 1 day 8 every 3 weeks      09/02/2017 Imaging    Progression of disease.development of mediastinal lymph nodes 1.1 cm subcarinal node previously 0.5 cm, new 7 mm prevascular lymph node, small bilateral lung nodules 2 mm and 7 mm, increase in sclerotic lesions in the thoracic spine  especially T8 and proximal left humerus;  Inc in liver mets 2.8 cm (was 1.5 cm), left hepatic lobe 1.9 cm was 0.9 cm, L4 compression fracture      09/17/2017 -  Chemotherapy    Carboplatin every 3 weeks        Breast carcinoma metastatic to multiple sites ([place])   12/31/2015 Initial Diagnosis    Breast carcinoma metastatic to multiple sites ([place])      09/03/2017 -  Chemotherapy    The patient had palonosetron (ALOXI) injection 0.25 mg, 0.25 mg, Intravenous,  Once, 1 of 6 cycles Administration: 0.25 mg (09/24/2017) CARBOplatin (PARAPLATIN) 700 mg in sodium chloride 0.9 % 250 mL chemo infusion, 700 mg (100 % of original dose 700 mg), Intravenous,  Once, 1 of 6 cycles Dose modification: 700 mg (original dose 700 mg, Cycle 1) Administration: 700 mg (09/24/2017) fosaprepitant (EMEND) 150 mg, dexamethasone (DECADRON) 12 mg in sodium chloride 0.9 % 145 mL IVPB, , Intravenous,  Once, 1 of 6 cycles Administration:  (09/24/2017)  for chemotherapy treatment.        REVIEW OF SYSTEMS:   Constitutional: Denies fevers, chills or abnormal weight loss Eyes: Denies blurriness of vision Ears, nose, mouth, throat, and face: Denies mucositis or sore throat Respiratory: Denies cough, dyspnea or wheezes Cardiovascular: Denies palpitation, chest discomfort Gastrointestinal:  Denies nausea, heartburn or change in bowel habits Skin: Denies abnormal skin rashes Lymphatics: Denies new lymphadenopathy or easy bruising Neurological:Denies numbness, tingling or new weaknesses Behavioral/Psych: Mood is stable, no new changes  Extremities: No lower extremity edema All other systems were reviewed with the patient and are negative.  I have reviewed the past medical history, past surgical history, social history and family history with the patient and they are unchanged from previous note.   PHYSICAL EXAMINATION: ECOG PERFORMANCE STATUS: 1 - Symptomatic but completely ambulatory  Vitals:   10/14/17 0526 10/14/17  1324  BP: (!) 135/95 (!) 144/94  Pulse: (!) 113 100  Resp: 17 18  Temp: 98.6 F (37 C) 98.5 F (36.9 C)  SpO2: 92% 99%   Filed Weights   10/12/17 0554  Weight: 155 lb (70.3 kg)    GENERAL:alert, no distress and comfortable SKIN: skin color, texture, turgor are normal, no rashes or significant lesions EYES: normal, Conjunctiva are pink and non-injected, sclera clear OROPHARYNX:no exudate, no erythema and lips, buccal mucosa, and tongue normal  NECK: supple, thyroid normal size, non-tender, without nodularity LYMPH:  no palpable lymphadenopathy in the cervical, axillary or inguinal LUNGS: clear to auscultation and percussion with normal breathing effort HEART: regular rate & rhythm and no murmurs and no lower extremity edema ABDOMEN:abdomen soft, non-tender and normal bowel sounds Musculoskeletal:no cyanosis of digits  and no clubbing  NEURO: alert & oriented x 3 with fluent speech, no focal motor/sensory deficits  LABORATORY DATA:  I have reviewed the data as listed CMP Latest Ref Rng & Units 10/14/2017 10/13/2017 10/12/2017  Glucose 65 - 99 mg/dL 106(H) 82 125(H)  BUN 6 - 20 mg/dL '6 8 12  ' Creatinine 0.44 - 1.00 mg/dL 0.41(L) 0.46 0.51  Sodium 135 - 145 mmol/L 136 139 134(L)  Potassium 3.5 - 5.1 mmol/L 3.9 3.7 4.5  Chloride 101 - 111 mmol/L 103 106 99(L)  CO2 22 - 32 mmol/L 23 25 21(L)  Calcium 8.9 - 10.3 mg/dL 7.8(L) 7.5(L) 8.5(L)  Total Protein 6.5 - 8.1 g/dL 7.0 6.4(L) 7.8  Total Bilirubin 0.3 - 1.2 mg/dL 1.3(H) 1.2 1.5(H)  Alkaline Phos 38 - 126 U/L 187(H) 163(H) 205(H)  AST 15 - 41 U/L 46(H) 44(H) 69(H)  ALT 14 - 54 U/L 46 54 80(H)    Lab Results  Component Value Date   WBC 6.1 10/14/2017   HGB 8.8 (L) 10/14/2017   HCT 27.1 (L) 10/14/2017   MCV 81.4 10/14/2017   PLT 109 (L) 10/14/2017   NEUTROABS 4.6 10/14/2017    ASSESSMENT AND PLAN: 1.  Metastatic breast cancer with progression of disease including worsening lymphadenopathy and progression of metastatic  disease in the liver.  This is causing her right upper quadrant abdominal pain. I recommended radiation oncology consultation to see if palliative radiation to that spot would be of benefit. She only had 1 dose of carboplatin and hence we cannot say for certain that it is not working.  I would rather give her a total of 3 cycles and then scan her.  Unfortunately patient is very young and with young children.  Her disease appears to be quite resistant to chemotherapy. We will present her case in the palliative care tumor board.

## 2017-10-14 NOTE — Progress Notes (Signed)
[facility] Progress Note  Patient: Debbie Martinez QBH:[id number]   PCP: Lucianne Lei, MD DOB: 11/20/1975   DOA: 10/12/2017   DOS: 10/14/2017   Date of Service: the patient was seen and examined on 10/14/2017  Subjective: Pain is about the same.  No nausea no vomiting.  No fever no chills.  Brief hospital course: Pt. with PMH of metastatic right breast cancer S/P radiation and chemotherapy, type II DM, PE on Xarelto; admitted on 10/12/2017, presented with complaint of nausea vomiting and right-sided pain, was found to have pain associated with metastatic cancer. Currently further plan is pain control.  Assessment and Plan: 1.  Right-sided chest wall pain. Health care associated pneumonia Coagulase negative staph UTI  Patient presented with severe right-sided pleuritic chest onset with nausea and vomiting. Initial concern was for acute cholecystitis and patient underwent ultrasound of the gallbladder which was also showing positive gallbladder sludge and edematous gallbladder. General surgery was consulted and patient underwent HIDA scan. HIDA scan was negative, cultures are so far negative as well and she does not have any right upper quadrant pain or Murphy sign on my examination. CT scan makes a comment for possible right middle lobe and upper lobe infiltrate although based on prior discussion with radiology it appears more like radiation-induced injury.  It appears that most of her pain is coming from either presence of lymphadenopathy, radiation pneumonitis, possible spread of cancer. Continue IV morphine, gabapentin.  Add oral Percocet. Radiation oncology as well as hemato-oncology consulted. Currently oncology recommended to start the patient on IV Decadron 1 dose followed by 4 mg twice daily Decadron. Change Zosyn to doxycycline to cover for UTI and continue Keflex as well to cover for pneumonia.  2.  Metastatic breast cancer. Followed by Dr. Lindi Adie. Discussed with Dr.  Lindi Adie, he will consult today.  3.  Recent PE. Anemia.  Likely chemotherapy-induced. Patient started on Xarelto recently in May 2019. Anemia likely from chemotherapy. Hemoglobin dropped down to 6.1 06/23. Receiving blood transfusion. We will resume Xarelto. Monitor CBC. No active bleeding reported right now.  Close monitoring for now.  4.  Diabetes mellitus. Continue sliding scale insulin for now.  5.  Sinus tachycardia as well as hypertension. Patient is on ARB.  May require beta-blocker for now. Likely the tachycardia is from pain control therefore will focus more on pain control for now and monitor on telemetry.  Diet: Carb modified diet DVT Prophylaxis: subcutaneous Heparin  Advance goals of care discussion: full code  Family Communication: no family was present at bedside, at the time of interview.   Disposition:  Discharge to home.  Consultants: oncology Procedures: none  Antibiotics: Anti-infectives (From admission, onward)   Start     Dose/Rate Route Frequency Ordered Stop   10/14/17 1200  doxycycline (VIBRA-TABS) tablet 100 mg     100 mg Oral Every 12 hours 10/14/17 0959     10/14/17 1200  azithromycin (ZITHROMAX) tablet 500 mg     500 mg Oral Daily 10/14/17 0959     10/12/17 1400  piperacillin-tazobactam (ZOSYN) IVPB 3.375 g  Status:  Discontinued     3.375 g 12.5 mL/hr over 240 Minutes Intravenous Every 8 hours 10/12/17 1100 10/14/17 0959   10/12/17 0700  vancomycin (VANCOCIN) 1,500 mg in sodium chloride 0.9 % 500 mL IVPB     1,500 mg 250 mL/hr over 120 Minutes Intravenous  Once 10/12/17 0651 10/12/17 1000   10/12/17 0645  piperacillin-tazobactam (ZOSYN) IVPB 3.375 g     3.375  g 100 mL/hr over 30 Minutes Intravenous  Once 10/12/17 0644 10/12/17 0740   10/12/17 0645  vancomycin (VANCOCIN) IVPB 1000 mg/200 mL premix  Status:  Discontinued     1,000 mg 200 mL/hr over 60 Minutes Intravenous  Once 10/12/17 0644 10/12/17 0650       Objective: Physical  Exam: Vitals:   10/13/17 2128 10/13/17 2128 10/14/17 0526 10/14/17 1324  BP: (!) 133/115 (!) 144/101 (!) 135/95 (!) 144/94  Pulse: (!) 124 (!) 121 (!) 113 100  Resp: 17 17 17 18   Temp: 99.5 F (37.5 C) 99.5 F (37.5 C) 98.6 F (37 C) 98.5 F (36.9 C)  TempSrc: Oral Oral Oral Oral  SpO2: 92% 92% 92% 99%  Weight:      Height:        Intake/Output Summary (Last 24 hours) at 10/14/2017 1428 Last data filed at 10/14/2017 0845 Gross per 24 hour  Intake 4424.97 ml  Output 0 ml  Net 4424.97 ml   Filed Weights   10/12/17 0554  Weight: 70.3 kg (155 lb)   General: Alert, Awake and Oriented to Time, Place and Person. Appear in marked distress, affect flat Eyes: PERRL, Conjunctiva normal ENT: Oral Mucosa clear moist. Neck: difficult to assess  JVD, no Abnormal Mass Or lumps Cardiovascular: S1 and S2 Present, no Murmur, Peripheral Pulses Present Respiratory: increased respiratory effort, Bilateral Air entry equal and Decreased, no use of accessory muscle, right Crackles, no wheezes Abdomen: Bowel Sound present, Soft and no tenderness, no hernia Skin: no redness, no Rash, no induration Extremities: trace Pedal edema, no calf tenderness Neurologic: Grossly no focal neuro deficit. Bilaterally Equal motor strength  Data Reviewed: CBC: Recent Labs  Lab 10/12/17 0603 10/13/17 0333 10/13/17 2233 10/14/17 0842  WBC 7.3 5.4 7.0 6.1  NEUTROABS 5.4  --   --  4.6  HGB 7.4* 6.1* 9.9* 8.8*  HCT 23.7* 19.7* 30.3* 27.1*  MCV 81.2 80.7 81.5 81.4  PLT 196 144* 150 662*   Basic Metabolic Panel: Recent Labs  Lab 10/12/17 0603 10/13/17 0333 10/14/17 0842  NA 134* 139 136  K 4.5 3.7 3.9  CL 99* 106 103  CO2 21* 25 23  GLUCOSE 125* 82 106*  BUN 12 8 6   CREATININE 0.51 0.46 0.41*  CALCIUM 8.5* 7.5* 7.8*    Liver Function Tests: Recent Labs  Lab 10/12/17 0603 10/13/17 0333 10/14/17 0842  AST 69* 44* 46*  ALT 80* 54 46  ALKPHOS 205* 163* 187*  BILITOT 1.5* 1.2 1.3*  PROT 7.8  6.4* 7.0  ALBUMIN 2.7* 2.3* 2.4*   Recent Labs  Lab 10/12/17 1242  LIPASE 18   No results for input(s): AMMONIA in the last 168 hours. Coagulation Profile: Recent Labs  Lab 10/12/17 0603 10/13/17 0333  INR 4.23* 1.74   Cardiac Enzymes: Recent Labs  Lab 10/14/17 0842  CKTOTAL 68   BNP (last 3 results) No results for input(s): PROBNP in the last 8760 hours. CBG: Recent Labs  Lab 10/13/17 0001 10/13/17 0043 10/13/17 0331 10/13/17 0733 10/14/17 0818  GLUCAP 64* 124* 79 71 97   Studies: No results found.  Scheduled Meds: . azithromycin  500 mg Oral Daily  . dexamethasone  4 mg Oral BID  . doxycycline  100 mg Oral Q12H  . gabapentin  300 mg Oral TID  . insulin aspart  0-9 Units Subcutaneous TID WC  . irbesartan  75 mg Oral Daily  . methocarbamol  500 mg Oral TID  . rivaroxaban  20  mg Oral Q supper  . sodium chloride flush  10-40 mL Intracatheter Q12H   Continuous Infusions: . dextrose 5 % and 0.9% NaCl 100 mL/hr at 10/13/17 2112   PRN Meds: acetaminophen **OR** acetaminophen, benzonatate, LORazepam, morphine injection, ondansetron **OR** ondansetron (ZOFRAN) IV, oxyCODONE-acetaminophen, sodium chloride flush  Time spent: 35 minutes  Author: Berle Mull, MD [facility] Pager: [phone] 10/14/2017 2:28 PM  If 7PM-7AM, please contact night-coverage at [email or web address], password [facility]

## 2017-10-15 ENCOUNTER — Inpatient Hospital Stay: Payer: Medicare Other

## 2017-10-15 ENCOUNTER — Encounter: Payer: Self-pay | Admitting: Radiation Oncology

## 2017-10-15 ENCOUNTER — Inpatient Hospital Stay: Payer: Medicare Other | Admitting: Hematology and Oncology

## 2017-10-15 ENCOUNTER — Ambulatory Visit
Admit: 2017-10-15 | Discharge: 2017-10-15 | Disposition: A | Discharge status: Home / Self Care | Payer: Medicare Other | Attending: Radiation Oncology | Admitting: Radiation Oncology

## 2017-10-15 LAB — CBC WITH DIFFERENTIAL/PLATELET
Basophils Absolute: 0 10*3/uL (ref 0.0–0.1)
Basophils Relative: 0 %
Eosinophils Absolute: 0 10*3/uL (ref 0.0–0.7)
Eosinophils Relative: 0 %
HCT: 28.1 % — ABNORMAL LOW (ref 36.0–46.0)
HEMOGLOBIN: 9 g/dL — AB (ref 12.0–15.0)
LYMPHS ABS: 0.8 10*3/uL (ref 0.7–4.0)
LYMPHS PCT: 11 %
MCH: 26.3 pg (ref 26.0–34.0)
MCHC: 32 g/dL (ref 30.0–36.0)
MCV: 82.2 fL (ref 78.0–100.0)
Monocytes Absolute: 0.5 10*3/uL (ref 0.1–1.0)
Monocytes Relative: 7 %
NEUTROS ABS: 6 10*3/uL (ref 1.7–7.7)
Neutrophils Relative %: 82 %
PLATELETS: 101 10*3/uL — AB (ref 150–400)
RBC: 3.42 MIL/uL — AB (ref 3.87–5.11)
RDW: 18.9 % — AB (ref 11.5–15.5)
WBC: 7.3 10*3/uL (ref 4.0–10.5)

## 2017-10-15 LAB — COMPREHENSIVE METABOLIC PANEL
ALT: 65 U/L — AB (ref 0–44)
AST: 80 U/L — ABNORMAL HIGH (ref 15–41)
Albumin: 2.5 g/dL — ABNORMAL LOW (ref 3.5–5.0)
Alkaline Phosphatase: 223 U/L — ABNORMAL HIGH (ref 38–126)
Anion gap: 10 (ref 5–15)
BUN: 11 mg/dL (ref 6–20)
CHLORIDE: 107 mmol/L (ref 98–111)
CO2: 23 mmol/L (ref 22–32)
CREATININE: 0.54 mg/dL (ref 0.44–1.00)
Calcium: 9.2 mg/dL (ref 8.9–10.3)
Glucose, Bld: 205 mg/dL — ABNORMAL HIGH (ref 70–99)
POTASSIUM: 4.6 mmol/L (ref 3.5–5.1)
Sodium: 140 mmol/L (ref 135–145)
Total Bilirubin: 0.6 mg/dL (ref 0.3–1.2)
Total Protein: 7.1 g/dL (ref 6.5–8.1)

## 2017-10-15 LAB — GLUCOSE, CAPILLARY
GLUCOSE-CAPILLARY: 73 mg/dL (ref 70–99)
GLUCOSE-CAPILLARY: 197 mg/dL — AB (ref 70–99)
GLUCOSE-CAPILLARY: 140 mg/dL — AB (ref 70–99)
Glucose-Capillary: 73 mg/dL (ref 70–99)
Glucose-Capillary: 128 mg/dL — ABNORMAL HIGH (ref 70–99)
Glucose-Capillary: 142 mg/dL — ABNORMAL HIGH (ref 70–99)
Glucose-Capillary: 220 mg/dL — ABNORMAL HIGH (ref 70–99)
Glucose-Capillary: 159 mg/dL — ABNORMAL HIGH (ref 70–99)

## 2017-10-15 LAB — MAGNESIUM: MAGNESIUM: 1.7 mg/dL (ref 1.7–2.4)

## 2017-10-15 MED ORDER — OXYCODONE HCL 5 MG PO TABS
5.0000 mg | ORAL_TABLET | ORAL | Status: DC | PRN
  Administered 2017-10-16 – 2017-10-17 (×4): 5 mg via ORAL
  Filled 2017-10-15 (×4): qty 1

## 2017-10-15 MED ORDER — MORPHINE SULFATE (PF) 2 MG/ML IV SOLN
2.0000 mg | INTRAVENOUS | Status: DC | PRN
  Administered 2017-10-15 – 2017-10-18 (×11): 4 mg via INTRAVENOUS
  Filled 2017-10-15 (×11): qty 2

## 2017-10-15 MED ORDER — LIP MEDEX EX OINT
1.0000 "application " | TOPICAL_OINTMENT | CUTANEOUS | Status: DC | PRN
  Administered 2017-10-16: 1 via TOPICAL
  Filled 2017-10-15: qty 7

## 2017-10-15 MED ORDER — OXYCODONE HCL 5 MG PO TABS
5.0000 mg | ORAL_TABLET | ORAL | Status: DC | PRN
Start: 2017-10-15 — End: 2017-10-15
  Administered 2017-10-15 (×2): 5 mg via ORAL
  Filled 2017-10-15 (×2): qty 1

## 2017-10-15 MED ORDER — POLYETHYLENE GLYCOL 3350 17 G PO PACK
17.0000 g | PACK | Freq: Every day | ORAL | Status: DC
  Administered 2017-10-16 – 2017-10-18 (×3): 17 g via ORAL
  Filled 2017-10-15 (×3): qty 1

## 2017-10-15 MED ORDER — DOCUSATE SODIUM 100 MG PO CAPS
100.0000 mg | ORAL_CAPSULE | Freq: Every day | ORAL | Status: DC
  Administered 2017-10-16 – 2017-10-17 (×2): 100 mg via ORAL
  Filled 2017-10-15 (×3): qty 1

## 2017-10-15 NOTE — Progress Notes (Signed)
  Radiation Oncology         [phone] ________________________________  Name: Debbie Martinez MRN: [id number]  Date: 10/12/2017  DOB: 05/13/1975  SIMULATION AND TREATMENT PLANNING NOTE Special Treatment Procedure Note:  Inpatient  DIAGNOSIS:     ICD-10-CM   1. Sepsis, due to unspecified organism ([place]) A41.9   2. RUQ abdominal pain R10.11 US Abdomen Limited RUQ    US Abdomen Limited RUQ    NARRATIVE:  The patient was brought to the [place].  Identity was confirmed.  All relevant records and images related to the planned course of therapy were reviewed.  The patient freely provided informed written consent to proceed with treatment after reviewing the details related to the planned course of therapy. The consent form was witnessed and verified by the simulation staff.    Then, the patient was set-up in a stable reproducible  supine position for radiation therapy.  Her right breast was tapped up to minimize overlap with her liver fields.  CT images were obtained.  Surface markings were placed.  The CT images were loaded into the planning software.    TREATMENT PLANNING NOTE: Treatment planning then occurred.  The radiation prescription was entered and confirmed.    A total of 3 medically necessary complex treatment devices were fabricated and supervised by me, in the form of fields with MLCs to block left kidney and right kidney and heart, lungs. MORE FIELDS WITH MLCs MAY BE ADDED IN DOSIMETRY for dose homogeneity.  I have requested : 3D Simulation  I have requested a DVH of the following structures: kidneys, liver, PTV.    I also asked for lungs, heart, and cord to be included in [facility].  The patient will receive 21 Gy in 7 fractions to the whole liver with palliative intent, and start RT tomorrow.  Special Treatment Procedure Note: The patient will be receiving chemotherapy concurrently. Chemotherapy heightens the risk of side effects. I have considered this  during the patient's treatment planning process and will monitor the patient accordingly for side effects on a weekly basis. Concurrent chemotherapy increases the complexity of this patient's treatment and therefore this constitutes a special treatment procedure.  Special Treatment Procedure Note: The patient received prior radiotherapy close to her current fields. There could be some overlap of radiation dose.  Prior regional radiotherapy increases the risk of side effects from treatment. I have considered this in the treatment planning process and have aimed to minimize tissue overlap.  This increases the complexity of this patient's treatment and therefore this constitutes a special treatment procedure.   -----------------------------------  [name], MD

## 2017-10-15 NOTE — Plan of Care (Signed)
  Problem: Health Behavior/Discharge Planning: Goal: Ability to manage health-related needs will improve Outcome: Progressing   Problem: Clinical Measurements: Goal: Will remain free from infection Outcome: Progressing Goal: Diagnostic test results will improve Outcome: Progressing Goal: Cardiovascular complication will be avoided Outcome: Progressing   Problem: Nutrition: Goal: Adequate nutrition will be maintained Outcome: Progressing   Problem: Coping: Goal: Level of anxiety will decrease Outcome: Progressing   Problem: Pain Managment: Goal: General experience of comfort will improve Outcome: Progressing   Problem: Clinical Measurements: Goal: Respiratory complications will improve Outcome: Not Progressing   Problem: Education: Goal: Knowledge of General Education information will improve Outcome: Adequate for Discharge   Problem: Clinical Measurements: Goal: Ability to maintain clinical measurements within normal limits will improve Outcome: Adequate for Discharge   Problem: Elimination: Goal: Will not experience complications related to bowel motility Outcome: Adequate for Discharge Goal: Will not experience complications related to urinary retention Outcome: Adequate for Discharge   Problem: Skin Integrity: Goal: Risk for impaired skin integrity will decrease Outcome: Adequate for Discharge

## 2017-10-15 NOTE — Care Management Important Message (Signed)
Important Message  Patient Details  Name: Debbie Martinez MRN: [id number] Date of Birth: 05-25-1975   Medicare Important Message Given:  Yes    Kerin Salen 10/15/2017, 11:25 AMImportant Message  Patient Details  Name: Debbie Martinez MRN: [id number] Date of Birth: 1975/05/23   Medicare Important Message Given:  Yes    Kerin Salen 10/15/2017, 11:25 AM

## 2017-10-15 NOTE — Progress Notes (Signed)
[facility] Progress Note  Patient: Debbie Martinez   PCP: Lucianne Lei, MD DOB: 04-04-1976   DOA: 10/12/2017   DOS: 10/15/2017   Date of Service: the patient was seen and examined on 10/15/2017  Subjective: Initially patient's pain was well controlled on oral medication.  Later after returning from the radiology session she had worsening pain uncontrolled with oral pain medication.  No nausea no vomiting.  No diarrhea reported.  Brief hospital course: Pt. with PMH of metastatic right breast cancer S/P radiation and chemotherapy, type II DM, PE on Xarelto; admitted on 10/12/2017, presented with complaint of nausea vomiting and right-sided pain, was found to have pain associated with metastatic cancer. Currently further plan is pain control.  Assessment and Plan: 1.  Right-sided chest wall and right upper quadrant pain. Health care associated pneumonia Coagulase negative staph UTI Metastatic breast cancer with metastasis to liver Patient presented with severe right-sided pleuritic chest onset with nausea and vomiting. Initial concern was for acute cholecystitis and patient underwent ultrasound of the gallbladder which was also showing positive gallbladder sludge and edematous gallbladder. General surgery was consulted and patient underwent HIDA scan. HIDA scan was negative, cultures are so far negative as well and she does not have any right upper quadrant pain or Murphy sign on my examination. CT scan makes a comment for possible right middle lobe and upper lobe infiltrate although based on prior discussion with radiology it appears more like radiation-induced injury.  It appears that most of her pain is coming from either presence of lymphadenopathy, radiation pneumonitis, possible spread of cancer. Continue IV morphine, gabapentin.  Add oral oxycodone. Radiation oncology as well as hemato-oncology consulted. oncology recommended to start the patient on IV Decadron 1  dose followed by 4 mg twice daily Decadron. Change Zosyn to doxycycline to cover for UTI and continue Keflex as well to cover for pneumonia.  2.  Metastatic breast cancer. Followed by Dr. Lindi Adie. Discussed with Dr. Lindi Adie. Outpatient chemotherapy for at least 3 cycle of current regimen. Palliative care consultation recommended by oncology. Radiation oncology was also consulted based on the recommendation for radiation for pain control.  3.  Recent PE. Anemia.  Likely chemotherapy-induced. Patient started on Xarelto recently in May 2019. Anemia likely from chemotherapy. Hemoglobin dropped down to 6.1 06/23. Received blood transfusion. resume Xarelto. Monitor CBC. No active bleeding reported right now.  Close monitoring for now.  4.  Diabetes mellitus. Continue sliding scale insulin for now.  5.  Sinus tachycardia as well as hypertension. Patient is on ARB.  May require beta-blocker for now. Likely the tachycardia is from pain control therefore will focus more on pain control for now and monitor on telemetry.  Diet: Carb modified diet DVT Prophylaxis: subcutaneous Heparin  Advance goals of care discussion: full code  Family Communication: no family was present at bedside, at the time of interview.   Disposition:  Discharge to home.  Consultants: oncology, Palliative care, radiation oncology, general surgery Procedures: none  Antibiotics: Anti-infectives (From admission, onward)   Start     Dose/Rate Route Frequency Ordered Stop   10/14/17 1430  cephALEXin (KEFLEX) capsule 500 mg     500 mg Oral Every 12 hours 10/14/17 1429     10/14/17 1200  doxycycline (VIBRA-TABS) tablet 100 mg     100 mg Oral Every 12 hours 10/14/17 0959     10/14/17 1200  azithromycin (ZITHROMAX) tablet 500 mg  Status:  Discontinued     500 mg Oral Daily  10/14/17 0959 10/14/17 1429   10/12/17 1400  piperacillin-tazobactam (ZOSYN) IVPB 3.375 g  Status:  Discontinued     3.375 g 12.5 mL/hr over 240  Minutes Intravenous Every 8 hours 10/12/17 1100 10/14/17 0959   10/12/17 0700  vancomycin (VANCOCIN) 1,500 mg in sodium chloride 0.9 % 500 mL IVPB     1,500 mg 250 mL/hr over 120 Minutes Intravenous  Once 10/12/17 0651 10/12/17 1000   10/12/17 0645  piperacillin-tazobactam (ZOSYN) IVPB 3.375 g     3.375 g 100 mL/hr over 30 Minutes Intravenous  Once 10/12/17 0644 10/12/17 0740   10/12/17 0645  vancomycin (VANCOCIN) IVPB 1000 mg/200 mL premix  Status:  Discontinued     1,000 mg 200 mL/hr over 60 Minutes Intravenous  Once 10/12/17 0644 10/12/17 0650       Objective: Physical Exam: Vitals:   10/14/17 2117 10/15/17 0543 10/15/17 0937 10/15/17 1411  BP: (!) 131/98 (!) 143/78  (!) 148/83  Pulse: 99 76  81  Resp: 18 18  (!) 24  Temp: 98.2 F (36.8 C) 97.7 F (36.5 C) (!) 97.5 F (36.4 C) 98 F (36.7 C)  TempSrc: Oral  Oral Oral  SpO2: 95% 98%  98%  Weight:      Height:        Intake/Output Summary (Last 24 hours) at 10/15/2017 1533 Last data filed at 10/15/2017 1100 Gross per 24 hour  Intake 600 ml  Output -  Net 600 ml   Filed Weights   10/12/17 0554  Weight: 70.3 kg (155 lb)   General: Alert, Awake and Oriented to Time, Place and Person. Appear in marked distress, affect flat Eyes: PERRL, Conjunctiva normal ENT: Oral Mucosa clear moist. Neck: difficult to assess  JVD, no Abnormal Mass Or lumps Cardiovascular: S1 and S2 Present, no Murmur, Peripheral Pulses Present Respiratory: increased respiratory effort, Bilateral Air entry equal and Decreased, no use of accessory muscle, right Crackles, no wheezes Abdomen: Bowel Sound present, Soft and no tenderness, no hernia Skin: no redness, no Rash, no induration Extremities: trace Pedal edema, no calf tenderness Neurologic: Grossly no focal neuro deficit. Bilaterally Equal motor strength  Data Reviewed: CBC: Recent Labs  Lab 10/12/17 0603 10/13/17 0333 10/13/17 2233 10/14/17 0842 10/15/17 0438  WBC 7.3 5.4 7.0 6.1 7.3    NEUTROABS 5.4  --   --  4.6 6.0  HGB 7.4* 6.1* 9.9* 8.8* 9.0*  HCT 23.7* 19.7* 30.3* 27.1* 28.1*  MCV 81.2 80.7 81.5 81.4 82.2  PLT 196 144* 150 109* 016*   Basic Metabolic Panel: Recent Labs  Lab 10/12/17 0603 10/13/17 0333 10/14/17 0842 10/15/17 0438  NA 134* 139 136 140  K 4.5 3.7 3.9 4.6  CL 99* 106 103 107  CO2 21* 25 23 23   GLUCOSE 125* 82 106* 205*  BUN 12 8 6 11   CREATININE 0.51 0.46 0.41* 0.54  CALCIUM 8.5* 7.5* 7.8* 9.2  MG  --   --   --  1.7    Liver Function Tests: Recent Labs  Lab 10/12/17 0603 10/13/17 0333 10/14/17 0842 10/15/17 0438  AST 69* 44* 46* 80*  ALT 80* 54 46 65*  ALKPHOS 205* 163* 187* 223*  BILITOT 1.5* 1.2 1.3* 0.6  PROT 7.8 6.4* 7.0 7.1  ALBUMIN 2.7* 2.3* 2.4* 2.5*   Recent Labs  Lab 10/12/17 1242  LIPASE 18   No results for input(s): AMMONIA in the last 168 hours. Coagulation Profile: Recent Labs  Lab 10/12/17 0603 10/13/17 0333  INR 4.23*  1.74   Cardiac Enzymes: Recent Labs  Lab 10/14/17 0842  CKTOTAL 68   BNP (last 3 results) No results for input(s): PROBNP in the last 8760 hours. CBG: Recent Labs  Lab 10/14/17 0818 10/14/17 1731 10/14/17 2113 10/15/17 0740 10/15/17 1149  GLUCAP 97 215* 301* 197* 220*   Studies: No results found.  Scheduled Meds: . cephALEXin  500 mg Oral Q12H  . dexamethasone  4 mg Oral BID  . docusate sodium  100 mg Oral Daily  . doxycycline  100 mg Oral Q12H  . gabapentin  300 mg Oral TID  . insulin aspart  0-9 Units Subcutaneous TID WC  . irbesartan  75 mg Oral Daily  . methocarbamol  500 mg Oral TID  . polyethylene glycol  17 g Oral Daily  . rivaroxaban  20 mg Oral Q supper  . sodium chloride flush  10-40 mL Intracatheter Q12H   Continuous Infusions:  PRN Meds: acetaminophen **OR** acetaminophen, benzonatate, LORazepam, morphine injection, ondansetron **OR** ondansetron (ZOFRAN) IV, oxyCODONE, sodium chloride flush  Time spent: 35 minutes  Author: Berle Mull, MD [facility] Pager: [phone] 10/15/2017 3:33 PM  If 7PM-7AM, please contact night-coverage at [email or web address], password [facility]

## 2017-10-15 NOTE — Progress Notes (Signed)
Nutrition  Patient identified on Malnutrition screening tool for poor appetite and weight loss.    Chart reviewed and noted hospital admission was planning to meet with patient during infusion today.  Will follow-up as able.  Debbie Martinez, [place], [place] Registered Dietitian [phone] (pager)

## 2017-10-16 ENCOUNTER — Ambulatory Visit
Admit: 2017-10-16 | Discharge: 2017-10-16 | Disposition: A | Discharge status: Home / Self Care | Payer: Medicare Other | Attending: Radiation Oncology | Admitting: Radiation Oncology

## 2017-10-16 DIAGNOSIS — R1011 Right upper quadrant pain: Secondary | ICD-10-CM

## 2017-10-16 DIAGNOSIS — C50919 Malignant neoplasm of unspecified site of unspecified female breast: Secondary | ICD-10-CM

## 2017-10-16 DIAGNOSIS — C50211 Malignant neoplasm of upper-inner quadrant of right female breast: Secondary | ICD-10-CM

## 2017-10-16 DIAGNOSIS — I10 Essential (primary) hypertension: Secondary | ICD-10-CM

## 2017-10-16 DIAGNOSIS — Z515 Encounter for palliative care: Secondary | ICD-10-CM

## 2017-10-16 DIAGNOSIS — Z170 Estrogen receptor positive status [ER+]: Secondary | ICD-10-CM

## 2017-10-16 DIAGNOSIS — I2699 Other pulmonary embolism without acute cor pulmonale: Secondary | ICD-10-CM

## 2017-10-16 DIAGNOSIS — R0789 Other chest pain: Secondary | ICD-10-CM

## 2017-10-16 DIAGNOSIS — D638 Anemia in other chronic diseases classified elsewhere: Secondary | ICD-10-CM

## 2017-10-16 DIAGNOSIS — G893 Neoplasm related pain (acute) (chronic): Secondary | ICD-10-CM

## 2017-10-16 LAB — CBC WITH DIFFERENTIAL/PLATELET
BASOS ABS: 0 10*3/uL (ref 0.0–0.1)
Basophils Relative: 0 %
EOS ABS: 0 10*3/uL (ref 0.0–0.7)
Eosinophils Relative: 0 %
HCT: 29.5 % — ABNORMAL LOW (ref 36.0–46.0)
Hemoglobin: 9.5 g/dL — ABNORMAL LOW (ref 12.0–15.0)
LYMPHS PCT: 11 %
Lymphs Abs: 1.2 10*3/uL (ref 0.7–4.0)
MCH: 26.6 pg (ref 26.0–34.0)
MCHC: 32.2 g/dL (ref 30.0–36.0)
MCV: 82.6 fL (ref 78.0–100.0)
MONOS PCT: 8 %
Monocytes Absolute: 0.9 10*3/uL (ref 0.1–1.0)
Neutro Abs: 8.6 10*3/uL — ABNORMAL HIGH (ref 1.7–7.7)
Neutrophils Relative %: 81 %
PLATELETS: 97 10*3/uL — AB (ref 150–400)
RBC: 3.57 MIL/uL — AB (ref 3.87–5.11)
RDW: 18.9 % — ABNORMAL HIGH (ref 11.5–15.5)
WBC: 10.7 10*3/uL — AB (ref 4.0–10.5)

## 2017-10-16 LAB — COMPREHENSIVE METABOLIC PANEL
ALK PHOS: 244 U/L — AB (ref 38–126)
ALT: 78 U/L — ABNORMAL HIGH (ref 0–44)
ANION GAP: 12 (ref 5–15)
AST: 78 U/L — ABNORMAL HIGH (ref 15–41)
Albumin: 2.6 g/dL — ABNORMAL LOW (ref 3.5–5.0)
BUN: 15 mg/dL (ref 6–20)
CALCIUM: 9.2 mg/dL (ref 8.9–10.3)
CO2: 22 mmol/L (ref 22–32)
Chloride: 106 mmol/L (ref 98–111)
Creatinine, Ser: 0.43 mg/dL — ABNORMAL LOW (ref 0.44–1.00)
GLUCOSE: 127 mg/dL — AB (ref 70–99)
POTASSIUM: 4.6 mmol/L (ref 3.5–5.1)
Sodium: 140 mmol/L (ref 135–145)
TOTAL PROTEIN: 7.3 g/dL (ref 6.5–8.1)
Total Bilirubin: 0.6 mg/dL (ref 0.3–1.2)

## 2017-10-16 LAB — GLUCOSE, CAPILLARY
GLUCOSE-CAPILLARY: 157 mg/dL — AB (ref 70–99)
GLUCOSE-CAPILLARY: 240 mg/dL — AB (ref 70–99)
Glucose-Capillary: 147 mg/dL — ABNORMAL HIGH (ref 70–99)
Glucose-Capillary: 112 mg/dL — ABNORMAL HIGH (ref 70–99)

## 2017-10-16 LAB — MAGNESIUM: MAGNESIUM: 1.5 mg/dL — AB (ref 1.7–2.4)

## 2017-10-16 MED ORDER — SENNOSIDES-DOCUSATE SODIUM 8.6-50 MG PO TABS
1.0000 | ORAL_TABLET | Freq: Two times a day (BID) | ORAL | Status: DC
  Administered 2017-10-16 – 2017-10-18 (×5): 1 via ORAL
  Filled 2017-10-16 (×5): qty 1

## 2017-10-16 MED ORDER — MAGNESIUM SULFATE 4 GM/100ML IV SOLN
4.0000 g | Freq: Once | INTRAVENOUS | Status: AC
  Administered 2017-10-16: 4 g via INTRAVENOUS
  Filled 2017-10-16: qty 100

## 2017-10-16 MED ORDER — OXYCODONE HCL ER 15 MG PO T12A
15.0000 mg | EXTENDED_RELEASE_TABLET | Freq: Two times a day (BID) | ORAL | Status: DC
  Administered 2017-10-16 (×2): 15 mg via ORAL
  Filled 2017-10-16 (×3): qty 1

## 2017-10-16 NOTE — Progress Notes (Signed)
PROGRESS NOTE    Debbie Martinez  EXH:[id number] DOB: 1975/11/02 DOA: 10/12/2017 PCP: Lucianne Lei, MD    Brief Narrative:  Pt. with PMH of metastatic right breast cancer S/P radiation and chemotherapy, type II DM, PE on Xarelto; admitted on 10/12/2017, presented with complaint of nausea vomiting and right-sided pain, was found to have pain associated with metastatic cancer. Currently further plan is pain control.     Assessment & Plan:   Principal Problem:   Right-sided chest wall pain Active Problems:   Breast cancer of upper-inner quadrant of right female breast (HCC)   Breast carcinoma metastatic to multiple sites [facility])   Hypertension   Anxiety   Bone metastasis (HCC)   Cancer associated pain   Diabetes ([place])   Pulmonary embolism (HCC)   Nausea with vomiting   Protein-calorie malnutrition, severe (HCC)   Anemia of chronic disease  1.  Right-sided chest wall and right upper quadrant pain. Health care associated pneumonia Coagulase negative staph UTI Metastatic breast cancer with metastasis to liver Patient presented with severe right-sided pleuritic chest and right upper quadrant abdominal pain, with nausea and vomiting.   Initial concern was for acute cholecystitis and patient underwent ultrasound of the gallbladder which was also showing positive gallbladder sludge and edematous gallbladder. General surgery was consulted and patient underwent HIDA scan. HIDA scan was negative, cultures are so far negative as well and she does not have any right upper quadrant pain or Murphy sign on my examination. CT scan makes a comment for possible right middle lobe and upper lobe infiltrate although based on prior discussion with radiology it appears more like radiation-induced injury.  Patient's pain likely secondary to metastatic breast cancer with progression of disease to the liver and lymphadenopathy leading to right upper quadrant abdominal pain.  Place patient on OxyContin  15 mg twice daily for long-acting pain control, continue oxycodone 5 mg every 4 hours as needed breakthrough pain.  Patient also started on Decadron.  Continue gabapentin.   Patient i also placed empirically on IV Zosyn for probable pneumonia and UTI.  Patient now has been transitioned to oral Keflex and doxycycline to complete a course of antibiotic treatment.   Palliative care consultation pending for goals of care and pain management.    2.  Metastatic breast cancer. Patient with progression of disease including lymphadenopathy, mets to the liver leading to right upper quadrant abdominal pain.  Patient seen in consultation by oncology who recommended radiation oncology consultation to assess patient for palliative radiation to help with pain management.  Radiation oncology has assessed patient patient underwent simulation the patient for first radiation treatment today.  Place patient on OxyContin 15 mg twice daily for long-acting pain regimen and continue oxycodone 5 mg every 4 hours as needed for breakthrough pain.  IV morphine as needed for breakthrough pain.  Outpatient chemotherapy for at least 3 cycle of current regimen. Palliative care consultation pending.   3.    History of recent PE.  Patient started on Xarelto in May 2019.  Continue Xarelto.  Follow H&H.  4 anemia Likely chemotherapy-induced.  Patient on Xarelto.  Patient with no overt bleeding.  Currently stable at 9.5. S/p 1 unit packed red blood cells.  Follow H&H  5.  Diabetes mellitus. Check a hemoglobin A1c.  Follow CBGs as patient now on steroids.  Sliding scale insulin.    6.  Sinus tachycardia / hypertension. Tachycardia likely secondary to pain.  Continue with pain management.  Continue Avapro for hypertension.  Follow.       DVT prophylaxis: xarelto Code Status: Full Family Communication: Updated patient.  No family at bedside. Disposition Plan: Likely home when medically stable with clinical  improvement.   Consultants:   General surgery: Dr. Barry Dienes 10/12/2017  Radiation oncology: Dr. Isidore Moos 10/14/2017  Medical oncology: Dr. Lindi Adie 10/14/2017  Procedures:   CT angio chest 10/13/2017  Chest x-ray 10/12/2017  HIDA scan 10/12/2017  Right upper quadrant ultrasound 10/12/2017  Antimicrobials:   Keflex  10/14/2017  Doxycycline 10/14/2017  IV Zosyn 10/12/2017>>>> 10/14/2017   Subjective: Patient laying in bed.  Patient states pain medication does not last long enough.  Denies any shortness of breath.  Denies any chest pain.  Having bowel movement.  Very tearful.  Objective: Vitals:   10/15/17 1411 10/15/17 2105 10/16/17 0555 10/16/17 1352  BP: (!) 148/83 (!) 147/91 (!) 158/93 (!) 140/96  Pulse: 81 87 (!) 56 89  Resp: (!) 24 18 16 16   Temp: 98 F (36.7 C) 98.5 F (36.9 C) 97.9 F (36.6 C) 97.9 F (36.6 C)  TempSrc: Oral Oral Oral Oral  SpO2: 98% 100% 100% 97%  Weight:      Height:        Intake/Output Summary (Last 24 hours) at 10/16/2017 1755 Last data filed at 10/16/2017 1000 Gross per 24 hour  Intake 360 ml  Output -  Net 360 ml   Filed Weights   10/12/17 0554  Weight: 70.3 kg (155 lb)    Examination:  General exam: Appears calm and comfortable  Respiratory system: Clear to auscultation. Respiratory effort normal. Cardiovascular system: S1 & S2 heard, RRR. No JVD, murmurs, rubs, gallops or clicks. No pedal edema. Gastrointestinal system: Abdomen is nondistended, soft and tender to palpation right upper quadrant.  Positive bowel sounds.  No rebound.  No guarding. Central nervous system: Alert and oriented. No focal neurological deficits. Extremities: Symmetric 5 x 5 power. Skin: No rashes, lesions or ulcers Psychiatry: Judgement and insight appear normal. Mood & affect appropriate.     Data Reviewed: I have personally reviewed following labs and imaging studies  CBC: Recent Labs  Lab 10/12/17 0603 10/13/17 0333 10/13/17 2233 10/14/17 0842  10/15/17 0438 10/16/17 0917  WBC 7.3 5.4 7.0 6.1 7.3 10.7*  NEUTROABS 5.4  --   --  4.6 6.0 8.6*  HGB 7.4* 6.1* 9.9* 8.8* 9.0* 9.5*  HCT 23.7* 19.7* 30.3* 27.1* 28.1* 29.5*  MCV 81.2 80.7 81.5 81.4 82.2 82.6  PLT 196 144* 150 109* 101* 97*   Basic Metabolic Panel: Recent Labs  Lab 10/12/17 0603 10/13/17 0333 10/14/17 0842 10/15/17 0438 10/16/17 0917  NA 134* 139 136 140 140  K 4.5 3.7 3.9 4.6 4.6  CL 99* 106 103 107 106  CO2 21* 25 23 23 22   GLUCOSE 125* 82 106* 205* 127*  BUN 12 8 6 11 15   CREATININE 0.51 0.46 0.41* 0.54 0.43*  CALCIUM 8.5* 7.5* 7.8* 9.2 9.2  MG  --   --   --  1.7 1.5*   GFR: Estimated Creatinine Clearance: 90 mL/min (A) (by C-G formula based on SCr of 0.43 mg/dL (L)). Liver Function Tests: Recent Labs  Lab 10/12/17 0603 10/13/17 0333 10/14/17 0842 10/15/17 0438 10/16/17 0917  AST 69* 44* 46* 80* 78*  ALT 80* 54 46 65* 78*  ALKPHOS 205* 163* 187* 223* 244*  BILITOT 1.5* 1.2 1.3* 0.6 0.6  PROT 7.8 6.4* 7.0 7.1 7.3  ALBUMIN 2.7* 2.3* 2.4* 2.5* 2.6*   Recent Labs  Lab 10/12/17 1242  LIPASE 18   No results for input(s): AMMONIA in the last 168 hours. Coagulation Profile: Recent Labs  Lab 10/12/17 0603 10/13/17 0333  INR 4.23* 1.74   Cardiac Enzymes: Recent Labs  Lab 10/14/17 0842  CKTOTAL 68   BNP (last 3 results) No results for input(s): PROBNP in the last 8760 hours. HbA1C: No results for input(s): HGBA1C in the last 72 hours. CBG: Recent Labs  Lab 10/15/17 1703 10/15/17 2107 10/16/17 0739 10/16/17 1153 10/16/17 1626  GLUCAP 159* 140* 147* 157* 112*   Lipid Profile: No results for input(s): CHOL, HDL, LDLCALC, TRIG, CHOLHDL, LDLDIRECT in the last 72 hours. Thyroid Function Tests: No results for input(s): TSH, T4TOTAL, FREET4, T3FREE, THYROIDAB in the last 72 hours. Anemia Panel: No results for input(s): VITAMINB12, FOLATE, FERRITIN, TIBC, IRON, RETICCTPCT in the last 72 hours. Sepsis Labs: Recent Labs  Lab  10/12/17 0603 10/12/17 0649 10/12/17 0908  LATICACIDVEN 1.9 1.97* 1.60    Recent Results (from the past 240 hour(s))  Culture, blood (Routine x 2)     Status: None (Preliminary result)   Collection Time: 10/12/17  6:03 AM  Result Value Ref Range Status   Specimen Description   Final    PORTA CATH LEFT CHEST Performed at [facility] [address] [place], [place] [address]    Special Requests   Final    BOTTLES DRAWN AEROBIC AND ANAEROBIC Blood Culture adequate volume Performed at [facility] [address], [place], [place] [address]    Culture   Final    NO GROWTH 4 DAYS Performed at [facility], [place] [address] [place], [place] [address]    Report Status PENDING  Incomplete  Urine culture     Status: Abnormal   Collection Time: 10/12/17  6:03 AM  Result Value Ref Range Status   Specimen Description   Final    URINE, RANDOM Performed at [facility] [address], [place], [place] [address]    Special Requests   Final    NONE Performed at [facility], [place] [address], [place], [place] [address]    Culture (A)  Final    >=100,000 COLONIES/mL STAPHYLOCOCCUS SPECIES (COAGULASE NEGATIVE)   Report Status 10/14/2017 FINAL  Final   Organism ID, Bacteria STAPHYLOCOCCUS SPECIES (COAGULASE NEGATIVE) (A)  Final      Susceptibility   Staphylococcus species (coagulase negative) - MIC*    CIPROFLOXACIN >=8 RESISTANT Resistant     GENTAMICIN <=0.5 SENSITIVE Sensitive     NITROFURANTOIN <=16 SENSITIVE Sensitive     OXACILLIN <=0.25 SENSITIVE Sensitive     TETRACYCLINE <=1 SENSITIVE Sensitive     VANCOMYCIN <=0.5 SENSITIVE Sensitive     TRIMETH/SULFA <=10 SENSITIVE Sensitive     CLINDAMYCIN RESISTANT Resistant     RIFAMPIN <=0.5 SENSITIVE Sensitive     Inducible Clindamycin POSITIVE Resistant     * >=100,000 COLONIES/mL STAPHYLOCOCCUS SPECIES (COAGULASE NEGATIVE)  Blood Culture (routine x 2)      Status: None (Preliminary result)   Collection Time: 10/12/17 12:42 PM  Result Value Ref Range Status   Specimen Description   Final    BLOOD SITE NOT SPECIFIED Performed at [facility], [address] [place], [place] [address]    Special Requests   Final    BOTTLES DRAWN AEROBIC AND ANAEROBIC Blood Culture adequate volume Performed at [facility] [address], [place], [place] [address]  Culture   Final    NO GROWTH 4 DAYS Performed at [facility], [place] [address] [place], [place] [address]    Report Status PENDING  Incomplete         Radiology Studies: No results found.      Scheduled Meds: . cephALEXin  500 mg Oral Q12H  . dexamethasone  4 mg Oral BID  . docusate sodium  100 mg Oral Daily  . doxycycline  100 mg Oral Q12H  . gabapentin  300 mg Oral TID  . insulin aspart  0-9 Units Subcutaneous TID WC  . irbesartan  75 mg Oral Daily  . methocarbamol  500 mg Oral TID  . oxyCODONE  15 mg Oral Q12H  . polyethylene glycol  17 g Oral Daily  . rivaroxaban  20 mg Oral Q supper  . senna-docusate  1 tablet Oral BID  . sodium chloride flush  10-40 mL Intracatheter Q12H   Continuous Infusions:   LOS: 4 days    Time spent: 40 minutes    Irine Seal, MD [facility] Pager [phone]  If 7PM-7AM, please contact night-coverage [email or web address] Password [facility] 10/16/2017, 5:55 PM

## 2017-10-16 NOTE — Progress Notes (Signed)
   10/16/17 1552  Clinical Encounter Type  Visited With Patient and family together;Health care provider  Visit Type Follow-up  Spiritual Encounters  Spiritual Needs Prayer   Followed up from a visit on Monday.  Patient's mother was present today.  Patient seemed to be feeling better and stated she is ready to fight the cancer.  Shared some about the plan of treatment and thankful the pain is under better control today.  We prayed together.  Will follow and support as needed. Chaplain Katherene Ponto

## 2017-10-16 NOTE — Progress Notes (Signed)
[facility] Therapy Treatment Record Phone [phone]   Radiation Therapy was administered to Debbie Martinez on: 10/16/2017  3:01 PM and was treatment # 1 out of a planned course of 7 treatments.  Radiation Treatment  1). Beam photons with 6-10 energy  2). Brachytherapy None  3). Stereotactic Radiosurgery None  4). Other Radiation None     Bennett Scrape, RT (T)

## 2017-10-16 NOTE — Consult Note (Signed)
Consultation Note Date: 10/16/2017   Patient Name: Debbie Martinez  DOB: May 11, 1975  MRN: [id number]  Age / Sex: 42 y.o., female  PCP: Lucianne Lei, MD Referring Physician: Eugenie Filler, MD  Reason for Consultation: Establishing goals of care, Pain control and Psychosocial/spiritual support  HPI/Patient Profile: 42 y.o. female   admitted on 10/12/2017 with significant past medical history  of breast cancer with metastases ( to bone and liver), diabetes, pulmonary embolism on Xarelto with 2-day history of nausea, vomiting, right upper quadrant abdominal pain.      She has had continued physical and functional decline and disease progression, disease has been resistant to chemotherapy.   Patient faces treatment option decisions, advanced directive decisions and increasing  care needs.    Clinical Assessment and Goals of Care:  This NP Wadie Lessen reviewed medical records, received report from team, assessed the patient and then meet at the patient's bedside o introduce the role of palliative medicine into a holistic treatment plan.  Today our discussion focused on  diagnosis, symptom management,  GOC,  disposition and options.  Main GOC is to prolong life, "I want to keep fighting especially for my young daughter"    A discussion was had today regarding advanced directives.   The difference between a aggressive medical intervention path  and a palliative comfort care path for this patient at this time was had.  Values and goals of care important to patient and her family were attempted to be elicited.   Questions and concerns addressed.   Family encouraged to call with questions or concerns.    PMT will continue to support holistically.   No documented HPOA, encouraged patient to consider documentation of HPOA and AD while hospitalized with the support Spiritual Care    SUMMARY OF  RECOMMENDATIONS    Code Status/Advance Care Planning:  Full code    Symptom Management:   We discussed pain management  strategies in detail.  [name]/her bedside RN an experienced hospice nurse was very  helpful in supporting patient in our attempt to get her adequate pain control with goal of discharge home   In order to calculate an accurate 24 medication total we will  continue with Dr Grandville Silos orders of:   Pain: Oxycodone ER 15 mg po every 12 hours                 Oxycodone IR 5 mg po every 4 hours prn                 Morphine IV 2-4 mg every 2 hours for severe pain  Evaluate in the morning and make adjustmetns  [name] and I discussed incorporating alternative approaches into a holistic treatment plan. Discussion and demonstration of relaxation breathing.  Patient tells me she thinks this will help  Palliative Prophylaxis:   Frequent Pain Assessment and Oral Care  Additional Recommendations (Limitations, Scope, Preferences):  Full Scope Treatment  Psycho-social/Spiritual:   [name] verbalized feelings and fears associated with her current medical situation.  It is hard for her to understand "why" this happening to her and "why the treatments are not working".  She expressed her belief that she feels that she was exposed to a carcinogen at her workplace; "there have been too many other co-workers getting cancer" We discussed the limitations of medical interventions and mortality as a human being, and the fact that she is doing everything in her power and control to resist this disease.  She is concerned with increasing care needs into the future.  She lives with her 29 yo daughter and her SO/[name] of 11 years.  Her mother is "getting more involved" recently and trying to help more.   Desire for further Chaplaincy support:yes  Additional Recommendations: Education on Hospice  Prognosis:   < 6 months  Discharge Planning: To Be Determined      Primary  Diagnoses: Present on Admission: . Breast cancer of upper-inner quadrant of right female breast ([place]) . Hypertension . Bone metastasis ([place]) . Cancer associated pain . Anxiety . Pulmonary embolism ([place]) . Breast carcinoma metastatic to multiple sites [facility])   I have reviewed the medical record, interviewed the patient and family, and examined the patient. The following aspects are pertinent.  Past Medical History:  Diagnosis Date  . Anxiety    gets sweaty and short of breath  . Breast cancer ([place]) 01/20/14   triple positive  . Diabetes   . GERD (gastroesophageal reflux disease)    does not take anything  . History of radiation therapy 11/03/14- 12/14/14   Right Breast, supraclavicular nodes, axillary nodes, and internal mammary nodes.  . History of radiation therapy 01/04/16- 01/23/16   Spine metastasis L2-S2  . Hypertension   . Liver metastases ([place])   . Metastatic cancer to bone ([place])   . Shortness of breath    'anxiety'   Social History   Socioeconomic History  . Marital status: Single    Spouse name: Not on file  . Number of children: Not on file  . Years of education: Not on file  . Highest education level: Not on file  Occupational History  . Not on file  Social Needs  . Financial resource strain: Not on file  . Food insecurity:    Worry: Not on file    Inability: Not on file  . Transportation needs:    Medical: Not on file    Non-medical: Not on file  Tobacco Use  . Smoking status: Never Smoker  . Smokeless tobacco: Never Used  Substance and Sexual Activity  . Alcohol use: Yes    Comment: Rarely- maybe  every 6 months  . Drug use: No  . Sexual activity: Never  Lifestyle  . Physical activity:    Days per week: Not on file    Minutes per session: Not on file  . Stress: Not on file  Relationships  . Social connections:    Talks on phone: Not on file    Gets together: Not on file    Attends religious service: Not on file    Active member of club or  organization: Not on file    Attends meetings of clubs or organizations: Not on file    Relationship status: Not on file  Other Topics Concern  . Not on file  Social History Narrative  . Not on file   Family History  Problem Relation Age of Onset  . Heart attack Father 5  . Lupus Maternal Aunt   . Prostate cancer Maternal  Grandfather 73  . Dementia Paternal Grandmother   . Leukemia Cousin 68       paternal first cousin   Scheduled Meds: . cephALEXin  500 mg Oral Q12H  . dexamethasone  4 mg Oral BID  . docusate sodium  100 mg Oral Daily  . doxycycline  100 mg Oral Q12H  . gabapentin  300 mg Oral TID  . insulin aspart  0-9 Units Subcutaneous TID WC  . irbesartan  75 mg Oral Daily  . methocarbamol  500 mg Oral TID  . oxyCODONE  15 mg Oral Q12H  . polyethylene glycol  17 g Oral Daily  . rivaroxaban  20 mg Oral Q supper  . senna-docusate  1 tablet Oral BID  . sodium chloride flush  10-40 mL Intracatheter Q12H   Continuous Infusions: . magnesium sulfate 1 - 4 g bolus IVPB 4 g (10/16/17 1207)   PRN Meds:.acetaminophen **OR** acetaminophen, benzonatate, lip balm, LORazepam, morphine injection, ondansetron **OR** ondansetron (ZOFRAN) IV, oxyCODONE, sodium chloride flush Medications Prior to Admission:  Prior to Admission medications   Medication Sig Start Date End Date Taking? Authorizing Provider  acetaminophen (TYLENOL) 500 MG tablet Take 1,000 mg by mouth every 4 (four) hours as needed for moderate pain.   Yes [provider]  Cholecalciferol (VITAMIN D-3 PO) Take 1 capsule by mouth daily.   Yes [provider]  ciprofloxacin (CIPRO) 250 MG tablet Take 250 mg by mouth 2 (two) times daily. 10/07/17  Yes [provider]  ENSURE (ENSURE) Take 237 mLs by mouth 2 (two) times daily between meals.    Yes [provider]  fentaNYL (DURAGESIC - DOSED MCG/HR) 25 MCG/HR patch Place 1 patch (25 mcg total) onto the skin every 3 (three) days. 09/10/17  Yes  Tanner, Lyndon Code., PA-C  fluticasone (FLONASE) 50 MCG/ACT nasal spray Place 2 sprays into both nostrils 2 (two) times daily as needed for allergies. 08/05/17  Yes [provider]  Fluticasone Furoate-Vilanterol (BREO ELLIPTA IN) Inhale 1 puff into the lungs daily as needed (Shortness of breath).   Yes [provider]  gabapentin (NEURONTIN) 100 MG capsule TAKE 1 CAPSULE BY MOUTH THREE TIMES A DAY 10/03/17  Yes [provider]  IRON PO Take 1 tablet by mouth 2 (two) times daily.   Yes [provider]  JANUMET 50-500 MG tablet Take 1 tablet by mouth daily. 07/10/17  Yes [provider]  lidocaine-prilocaine (EMLA) cream Apply to affected area once 09/03/17  Yes Gudena, Loleta Dicker, MD  Lidocaine-Prilocaine, Bulk, 2.5-2.5 % CREA Apply 5 mLs topically as needed. 2 hours before use, cover with plawtic wrap. 05/16/17  Yes [name], MD  LORazepam (ATIVAN) 0.5 MG tablet Take 1 tablet (0.5 mg total) by mouth at bedtime as needed (Nausea or vomiting). 09/03/17  Yes [name], MD  Multiple Vitamins-Minerals (MULTIVITAMIN ADULT) TABS Take 1 tablet by mouth daily.   Yes [provider]  omeprazole (PRILOSEC) 20 MG capsule Take 1 capsule (20 mg total) by mouth daily. Take while using dexamethasone to prevent heartburn. Patient taking differently: Take 20 mg by mouth daily as needed (Heartburn).  01/09/16  Yes [name], MD  ondansetron (ZOFRAN) 8 MG tablet Take 1 tablet (8 mg total) by mouth 2 (two) times daily as needed for refractory nausea / vomiting. Start on day 3 after chemo. 09/03/17  Yes [name], MD  polyethylene glycol (MIRALAX / GLYCOLAX) packet Take 17 g by mouth daily. Patient taking differently: Take 17 g by  mouth daily as needed for mild constipation.  01/05/16  Yes Charlynne Cousins, MD  prochlorperazine (COMPAZINE) 10 MG tablet Take 1 tablet (10 mg total) by mouth every 6 (six) hours as needed (Nausea or vomiting). 09/03/17  Yes [name],  MD  rivaroxaban (XARELTO) 20 MG TABS tablet Take 1 tablet (20 mg total) by mouth daily with supper. 09/26/17  Yes Debbe Odea, MD  valsartan (DIOVAN) 80 MG tablet Take 1 tablet (80 mg total) by mouth daily. 09/08/17  Yes Debbe Odea, MD  ACCU-CHEK AVIVA PLUS test strip USE AS DIRECTED 4 TIMES A DAY 12/27/16   [name], MD  ACCU-CHEK SOFTCLIX LANCETS lancets 100 each by Other route 4 (four) times daily. Use as instructed 11/12/16   [name], MD  blood glucose meter kit and supplies KIT Dispense based on patient and insurance preference. Use up to four times daily as directed. (FOR ICD-9 250.00, 250.01). 01/12/16   [name], MD  gabapentin (NEURONTIN) 300 MG capsule Take 1 capsule (300 mg total) by mouth 3 (three) times daily. Patient not taking: Reported on 10/12/2017 09/10/17   Harle Stanford., PA-C  oxyCODONE-acetaminophen (PERCOCET/ROXICET) 5-325 MG tablet Take 1-2 tablets by mouth every 6 (six) hours as needed for severe pain. 10/07/17   [name], MD  Rivaroxaban (XARELTO) 15 MG TABS tablet Take 1 tablet (15 mg total) by mouth 2 (two) times daily with a meal. Patient not taking: Reported on 10/12/2017 09/07/17   Debbe Odea, MD  traMADol (ULTRAM) 50 MG tablet Take 1 tablet (50 mg total) by mouth every 6 (six) hours as needed. Patient taking differently: Take 50 mg by mouth every 6 (six) hours as needed for moderate pain.  09/10/17   Harle Stanford., PA-C   Allergies  Allergen Reactions  . Lisinopril Swelling and Cough    Swelling under eyes and eyelids    . Tamoxifen Itching   Review of Systems  Constitutional: Positive for fatigue.  Gastrointestinal: Positive for abdominal pain and nausea.  Musculoskeletal: Positive for back pain.  Neurological: Positive for weakness.    Physical Exam  Constitutional: She is oriented to person, place, and time. She appears well-developed.  Cardiovascular: Normal rate, regular rhythm and normal heart sounds.  Pulmonary/Chest: She has  decreased breath sounds.  Abdominal: There is tenderness.  Neurological: She is alert and oriented to person, place, and time.  Skin: Skin is warm and dry.    Vital Signs: BP (!) 140/96 (BP Location: Left Arm)   Pulse 89   Temp 97.9 F (36.6 C) (Oral)   Resp 16   Ht 5' 7" (1.702 m)   Wt 70.3 kg (155 lb)   SpO2 97%   BMI 24.28 kg/m  Pain Scale: 0-10 POSS *See Group Information*: 1-Acceptable,Awake and alert Pain Score: 0-No pain   SpO2: SpO2: 97 % O2 Device:SpO2: 97 % O2 Flow Rate: .   IO: Intake/output summary:   Intake/Output Summary (Last 24 hours) at 10/16/2017 1403 Last data filed at 10/16/2017 1000 Gross per 24 hour  Intake 360 ml  Output -  Net 360 ml    LBM: Last BM Date: 10/15/17 Baseline Weight: Weight: 70.3 kg (155 lb) Most recent weight: Weight: 70.3 kg (155 lb)     Palliative Assessment/Data: 40 %   Discussed with Dr Grandville Silos and Dr Sonny Dandy  Time In: 1100 Time Out: 1215 Time Total: 75 minutes Greater than 50%  of this time was spent counseling and coordinating care related to the above  assessment and plan.  Signed by: Wadie Lessen, NP   Please contact Palliative Medicine Team phone at [phone] for questions and concerns.  For individual provider: See Shea Evans

## 2017-10-17 ENCOUNTER — Ambulatory Visit
Admit: 2017-10-17 | Discharge: 2017-10-17 | Disposition: A | Discharge status: Home / Self Care | Payer: Medicare Other | Attending: Radiation Oncology | Admitting: Radiation Oncology

## 2017-10-17 DIAGNOSIS — T451X5S Adverse effect of antineoplastic and immunosuppressive drugs, sequela: Secondary | ICD-10-CM

## 2017-10-17 DIAGNOSIS — D701 Agranulocytosis secondary to cancer chemotherapy: Secondary | ICD-10-CM

## 2017-10-17 DIAGNOSIS — R531 Weakness: Secondary | ICD-10-CM

## 2017-10-17 DIAGNOSIS — F419 Anxiety disorder, unspecified: Secondary | ICD-10-CM

## 2017-10-17 DIAGNOSIS — Z515 Encounter for palliative care: Secondary | ICD-10-CM

## 2017-10-17 DIAGNOSIS — Z7189 Other specified counseling: Secondary | ICD-10-CM

## 2017-10-17 DIAGNOSIS — D6481 Anemia due to antineoplastic chemotherapy: Secondary | ICD-10-CM

## 2017-10-17 LAB — CULTURE, BLOOD (ROUTINE X 2)
CULTURE: NO GROWTH
Culture: NO GROWTH
Special Requests: ADEQUATE
Special Requests: ADEQUATE

## 2017-10-17 LAB — HEMOGLOBIN A1C
Hgb A1c MFr Bld: 6.5 % — ABNORMAL HIGH (ref 4.8–5.6)
Mean Plasma Glucose: 139.85 mg/dL

## 2017-10-17 LAB — BASIC METABOLIC PANEL
Anion gap: 12 (ref 5–15)
BUN: 15 mg/dL (ref 6–20)
CHLORIDE: 99 mmol/L (ref 98–111)
CO2: 25 mmol/L (ref 22–32)
CREATININE: 0.51 mg/dL (ref 0.44–1.00)
Calcium: 9 mg/dL (ref 8.9–10.3)
GFR calc Af Amer: >60 mL/min (ref >60)
GFR calc non Af Amer: >60 mL/min (ref >60)
GLUCOSE: 172 mg/dL — AB (ref 70–99)
POTASSIUM: 4.5 mmol/L (ref 3.5–5.1)
Sodium: 136 mmol/L (ref 135–145)

## 2017-10-17 LAB — CBC
HCT: 28.9 % — ABNORMAL LOW (ref 36.0–46.0)
Hemoglobin: 9.4 g/dL — ABNORMAL LOW (ref 12.0–15.0)
MCH: 26.7 pg (ref 26.0–34.0)
MCHC: 32.5 g/dL (ref 30.0–36.0)
MCV: 82.1 fL (ref 78.0–100.0)
Platelets: 83 10*3/uL — ABNORMAL LOW (ref 150–400)
RBC: 3.52 MIL/uL — ABNORMAL LOW (ref 3.87–5.11)
RDW: 19.2 % — AB (ref 11.5–15.5)
WBC: 9.1 10*3/uL (ref 4.0–10.5)

## 2017-10-17 LAB — GLUCOSE, CAPILLARY
GLUCOSE-CAPILLARY: 275 mg/dL — AB (ref 70–99)
GLUCOSE-CAPILLARY: 102 mg/dL — AB (ref 70–99)
Glucose-Capillary: 195 mg/dL — ABNORMAL HIGH (ref 70–99)
Glucose-Capillary: 264 mg/dL — ABNORMAL HIGH (ref 70–99)

## 2017-10-17 LAB — MAGNESIUM: MAGNESIUM: 1.7 mg/dL (ref 1.7–2.4)

## 2017-10-17 MED ORDER — OXYCODONE HCL ER 15 MG PO T12A
30.0000 mg | EXTENDED_RELEASE_TABLET | Freq: Two times a day (BID) | ORAL | Status: DC
  Administered 2017-10-17 – 2017-10-18 (×3): 30 mg via ORAL
  Filled 2017-10-17 (×3): qty 2

## 2017-10-17 MED ORDER — MAGNESIUM SULFATE 4 GM/100ML IV SOLN
4.0000 g | Freq: Once | INTRAVENOUS | Status: AC
  Administered 2017-10-17: 4 g via INTRAVENOUS
  Filled 2017-10-17: qty 100

## 2017-10-17 MED ORDER — LORAZEPAM 0.5 MG PO TABS: 0.5000 mg | ORAL_TABLET | Freq: Four times a day (QID) | ORAL | Status: DC | PRN

## 2017-10-17 MED ORDER — OXYCODONE HCL 5 MG PO TABS
10.0000 mg | ORAL_TABLET | ORAL | Status: DC | PRN
  Administered 2017-10-17 – 2017-10-18 (×7): 10 mg via ORAL
  Filled 2017-10-17 (×7): qty 2

## 2017-10-17 NOTE — Progress Notes (Signed)
Patient ID: Debbie Martinez, female   DOB: [birth year], 42 y.o.   MRN: [id number]  This NP visited patient at the bedside as a follow up to  yesterday's [place] for evaluation of pain medication changes and  emotional support.   Mother at bedside, daughter is sleeping in bed with patient, she stayed the night.    Patient reports that she slept better last night and that her pain is "better controlled".  Patient utilized an equivalent  total of 140 mg of oral morphine in a 24 hr period.  Will increase Oxycodone ER to 30 mg po every 12 hours                        Oxycodone IR 10 mg po every 4 hours                         Ativan 0.5 mg po every 6 hours Continue to utilize relaxation breathing techniques. "it really does help"  Discussed with patient and her RN importance of utilizing orals agents vs IV, we want to get her on a viable home regiment.   Discussed hospcie benefit in detail, patient is open to any assistante she can get in the home but not at a place to forego chemotherapy.  She is open to all offered and available medical interventions to prolong life.  Discussed community based palliative services and she is open to this, I will call a referral.  Also encouraged [facility] for her daughter  Patient would like to secure HPOA during this hospital stay, will alert Spiritual Care   Discussed with patient the importance of continued conversation with her  family and her   medical providers regarding overall plan of care and treatment options,  ensuring decisions are within the context of the patients values and GOCs.  Questions and concerns addressed   Discussed with Dr  Grandville Silos    Total time spent on the unit was 45 minutes  Greater than 50% of the time was spent in counseling and coordination of care  Wadie Lessen NP  Palliative Medicine Team Team Phone # [phone] Pager [phone]

## 2017-10-17 NOTE — Progress Notes (Signed)
   10/17/17 1600  Clinical Encounter Type  Visited With Patient and family together  Visit Type Initial;Psychological support;Spiritual support  Referral From Patient  Consult/Referral To Chaplain  Spiritual Encounters  Spiritual Needs Other (Comment);Literature (Advance Directive )  Stress Factors  Patient Stress Factors Health changes;Major life changes  Family Stress Factors Health changes;Major life changes  Advance Directives (For Healthcare)  Does Patient Have a Medical Advance Directive? No  Would patient like information on creating a medical advance directive? Yes (Inpatient - patient requests chaplain consult to create a medical advance directive) (Patient Provided paperwork )   I visited with the patient per Spiritual Care consult. The patient wants to complete an advance Directive while she is in the hospital. I provided her the Advance Directive document. Spiritual Care will follow up with the patient tomorrow to notarize and complete the document.   Please, contact Spiritual Care for further assistance.   Chaplain Shanon Ace M.Div., [facility]

## 2017-10-17 NOTE — Progress Notes (Signed)
[facility] Therapy Treatment Record Phone [phone]   Radiation Therapy was administered to Debbie Martinez on: 10/17/2017  11:27 AM and was treatment # 2 out of a planned course of 7 treatments.  Radiation Treatment  1). Beam photons with 6-10 energy  2). Brachytherapy None  3). Stereotactic Radiosurgery None  4). Other Radiation None     Bennett Scrape, RT (T)

## 2017-10-17 NOTE — Progress Notes (Signed)
HEMATOLOGY-ONCOLOGY PROGRESS NOTE  SUBJECTIVE: Patient appears to be sleepy from pain medication.  She is also receiving radiation for abdominal pain.  Her mother and daughter are in the room.  She appears to be in good spirits.  OBJECTIVE: REVIEW OF SYSTEMS:   Constitutional: Denies fevers, chills or abnormal weight loss Eyes: Denies blurriness of vision Ears, nose, mouth, throat, and face: Denies mucositis or sore throat Respiratory: Denies cough, dyspnea or wheezes Cardiovascular: Denies palpitation, chest discomfort Gastrointestinal: Abdominal pain Skin: Denies abnormal skin rashes Neurological:Denies numbness, tingling or new weaknesses Behavioral/Psych: Mood is stable, no new changes  Extremities: No lower extremity edema All other systems were reviewed with the patient and are negative.  I have reviewed the past medical history, past surgical history, social history and family history with the patient and they are unchanged from previous note.   PHYSICAL EXAMINATION: ECOG PERFORMANCE STATUS: 3 - Symptomatic, >50% confined to bed  Vitals:   10/16/17 2056 10/17/17 0444  BP: (!) 146/98 (!) 131/91  Pulse: 89 100  Resp: 18 18  Temp:  98.4 F (36.9 C)  SpO2: 95% 95%   Filed Weights   10/12/17 0554  Weight: 155 lb (70.3 kg)    GENERAL:alert, no distress and comfortable SKIN: skin color, texture, turgor are normal, no rashes or significant lesions EYES: normal, Conjunctiva are pink and non-injected, sclera clear OROPHARYNX:no exudate, no erythema and lips, buccal mucosa, and tongue normal  NECK: supple, thyroid normal size, non-tender, without nodularity LYMPH:  no palpable lymphadenopathy in the cervical, axillary or inguinal LUNGS: clear to auscultation and percussion with normal breathing effort HEART: regular rate & rhythm and no murmurs and no lower extremity edema ABDOMEN: Tenderness right upper quadrant Musculoskeletal:no cyanosis of digits and no clubbing   NEURO: alert & oriented x 3 with fluent speech, no focal motor/sensory deficits  LABORATORY DATA:  I have reviewed the data as listed CMP Latest Ref Rng & Units 10/17/2017 10/16/2017 10/15/2017  Glucose 70 - 99 mg/dL 172(H) 127(H) 205(H)  BUN 6 - 20 mg/dL 15 15 11   Creatinine 0.44 - 1.00 mg/dL 0.51 0.43(L) 0.54  Sodium 135 - 145 mmol/L 136 140 140  Potassium 3.5 - 5.1 mmol/L 4.5 4.6 4.6  Chloride 98 - 111 mmol/L 99 106 107  CO2 22 - 32 mmol/L 25 22 23   Calcium 8.9 - 10.3 mg/dL 9.0 9.2 9.2  Total Protein 6.5 - 8.1 g/dL - 7.3 7.1  Total Bilirubin 0.3 - 1.2 mg/dL - 0.6 0.6  Alkaline Phos 38 - 126 U/L - 244(H) 223(H)  AST 15 - 41 U/L - 78(H) 80(H)  ALT 0 - 44 U/L - 78(H) 65(H)    Lab Results  Component Value Date   WBC 9.1 10/17/2017   HGB 9.4 (L) 10/17/2017   HCT 28.9 (L) 10/17/2017   MCV 82.1 10/17/2017   PLT 83 (L) 10/17/2017   NEUTROABS 8.6 (H) 10/16/2017    ASSESSMENT AND PLAN: 1.  Metastatic breast cancer with right upper quadrant tenderness from liver metastases and capsule related to stretching of the liver: We appreciate Stanton Kidney our palliative care nurse seeing her.  Also appreciate Dr. Isidore Moos for proceeding with palliative radiation therapy to the liver. 2. prognosis: Patient fully understands her prognosis. Given her young age and her child she wants to continue to fight as long as there are treatment options. 3.  Anemia and thrombocytopenia due to prior chemo Once she gets discharged from the hospital and need to bring her back and to  reinitiate chemotherapy.

## 2017-10-17 NOTE — Progress Notes (Signed)
PROGRESS NOTE    Debbie Martinez  SFK:[id number] DOB: Jan 15, 1976 DOA: 10/12/2017 PCP: Lucianne Lei, MD    Brief Narrative:  Pt. with PMH of metastatic right breast cancer S/P radiation and chemotherapy, type II DM, PE on Xarelto; admitted on 10/12/2017, presented with complaint of nausea vomiting and right-sided pain, was found to have pain associated with metastatic cancer. Currently further plan is pain control.     Assessment & Plan:   Principal Problem:   Right-sided chest wall pain Active Problems:   Breast cancer of upper-inner quadrant of right female breast (HCC)   Breast carcinoma metastatic to multiple sites ([place])   Hypertension   Anxiety   Bone metastasis (HCC)   Cancer associated pain   DNR (do not resuscitate) discussion   Diabetes ([place])   Pulmonary embolism (HCC)   Nausea with vomiting   Protein-calorie malnutrition, severe ([place])   Anemia of chronic disease   RUQ abdominal pain   Palliative care by specialist   Weakness generalized  1.  Right-sided chest wall and right upper quadrant pain. Health care associated pneumonia Coagulase negative staph UTI Metastatic breast cancer with metastasis to liver Patient presented with severe right-sided pleuritic chest and right upper quadrant abdominal pain, with nausea and vomiting.   Initial concern was for acute cholecystitis and patient underwent ultrasound of the gallbladder which was also showing positive gallbladder sludge and edematous gallbladder. General surgery was consulted and patient underwent HIDA scan. HIDA scan was negative, cultures are so far negative as well and she does not have any right upper quadrant pain or Murphy sign on my examination. CT scan makes a comment for possible right middle lobe and upper lobe infiltrate although based on prior discussion with radiology it appears more like radiation-induced injury.  Patient's pain likely secondary to metastatic breast cancer with progression of  disease to the liver and lymphadenopathy leading to right upper quadrant abdominal pain.  Patient with some improvement with pain control however still with increased pain and required increased doses of IV morphine overnight.  OxyContin increased to 30 mg twice daily for long-acting pain control and oxycodone increased to 10 mg every 4 hours as needed for breakthrough pain per palliative care.  Continue Decadron and gabapentin.  Patient also placed empirically on IV Zosyn for probable pneumonia and UTI.  Patient now has been transitioned to oral Keflex and doxycycline to complete a course of antibiotic treatment.   Palliative care consultation pending for goals of care and pain management.    2.  Metastatic breast cancer. Patient with progression of disease including lymphadenopathy, mets to the liver leading to right upper quadrant abdominal pain.  Patient seen in consultation by oncology who recommended radiation oncology consultation to assess patient for palliative radiation to help with pain management.  Radiation oncology has assessed patient patient underwent simulation the patient for first radiation treatment today.  OxyContin dose increased to 30 mg twice daily for long-acting pain regimen and oxycodone increased to 10 mg every 4 hours as needed for breakthrough pain per palliative care.  Continue IV morphine as needed.  Outpatient chemotherapy for at least 3 cycle of current regimen. Palliative care consultation pending.   3.    History of recent PE.  Patient started on Xarelto in May 2019.  Continue Xarelto.  Follow H&H.  4 anemia Likely chemotherapy-induced.  Patient on Xarelto.  Patient with no overt bleeding.  Currently stable at 9.4. S/p 1 unit packed red blood cells.  Follow H&H  5.  Diabetes mellitus. Hemoglobin A1c is 6.5.  CBG ranging from 1 95-2 75.  Elevated CBGs likely secondary to steroids.  Continue sliding scale insulin.    6.  Sinus tachycardia /  hypertension. Tachycardia likely secondary to pain.  Continue with pain management.  Continue Avapro for hypertension.  Follow.       DVT prophylaxis: xarelto Code Status: Full Family Communication: Updated patient and mother at bedside. Disposition Plan: Likely home when medically stable with clinical improvement and pain management hopefully in the next 24 to 48 hours..   Consultants:   General surgery: Dr. Barry Dienes 10/12/2017  Radiation oncology: Dr. Isidore Moos 10/14/2017  Medical oncology: Dr. Lindi Adie 10/14/2017  Palliative care: Wadie Lessen, NP 10/16/2017  Procedures:   CT angio chest 10/13/2017  Chest x-ray 10/12/2017  HIDA scan 10/12/2017  Right upper quadrant ultrasound 10/12/2017  Antimicrobials:   Keflex  10/14/2017  Doxycycline 10/14/2017  IV Zosyn 10/12/2017>>>> 10/14/2017   Subjective: Patient sitting by the window.  States still with significant right-sided upper abdominal pain however some improvement since yesterday after being placed on long-acting pain medication.  Denies any shortness of breath.  No chest pain.  Mother and daughter at bedside.  Objective: Vitals:   10/16/17 0555 10/16/17 1352 10/16/17 2056 10/17/17 0444  BP: (!) 158/93 (!) 140/96 (!) 146/98 (!) 131/91  Pulse: (!) 56 89 89 100  Resp: 16 16 18 18   Temp: 97.9 F (36.6 C) 97.9 F (36.6 C)  98.4 F (36.9 C)  TempSrc: Oral Oral  Oral  SpO2: 100% 97% 95% 95%  Weight:      Height:        Intake/Output Summary (Last 24 hours) at 10/17/2017 1047 Last data filed at 10/17/2017 0600 Gross per 24 hour  Intake 840 ml  Output -  Net 840 ml   Filed Weights   10/12/17 0554  Weight: 70.3 kg (155 lb)    Examination:  General exam: NAD Respiratory system: CTAB. Respiratory effort normal. Cardiovascular system: RRR. No m/r/g. No pedal edema. Gastrointestinal system: Abdomen is soft, nontender, nondistended, positive bowel sounds.  No rebound.  No guarding.  Central nervous system: Alert and  oriented. No focal neurological deficits. Extremities: Symmetric 5 x 5 power. Skin: No rashes, lesions or ulcers Psychiatry: Judgement and insight appear normal. Mood & affect appropriate.     Data Reviewed: I have personally reviewed following labs and imaging studies  CBC: Recent Labs  Lab 10/12/17 0603  10/13/17 2233 10/14/17 0842 10/15/17 0438 10/16/17 0917 10/17/17 0430  WBC 7.3   < > 7.0 6.1 7.3 10.7* 9.1  NEUTROABS 5.4  --   --  4.6 6.0 8.6*  --   HGB 7.4*   < > 9.9* 8.8* 9.0* 9.5* 9.4*  HCT 23.7*   < > 30.3* 27.1* 28.1* 29.5* 28.9*  MCV 81.2   < > 81.5 81.4 82.2 82.6 82.1  PLT 196   < > 150 109* 101* 97* 83*   < > = values in this interval not displayed.   Basic Metabolic Panel: Recent Labs  Lab 10/13/17 0333 10/14/17 0842 10/15/17 0438 10/16/17 0917 10/17/17 0430  NA 139 136 140 140 136  K 3.7 3.9 4.6 4.6 4.5  CL 106 103 107 106 99  CO2 25 23 23 22 25   GLUCOSE 82 106* 205* 127* 172*  BUN 8 6 11 15 15   CREATININE 0.46 0.41* 0.54 0.43* 0.51  CALCIUM 7.5* 7.8* 9.2 9.2 9.0  MG  --   --  1.7 1.5* 1.7   GFR: Estimated Creatinine Clearance: 90 mL/min (by C-G formula based on SCr of 0.51 mg/dL). Liver Function Tests: Recent Labs  Lab 10/12/17 0603 10/13/17 0333 10/14/17 0842 10/15/17 0438 10/16/17 0917  AST 69* 44* 46* 80* 78*  ALT 80* 54 46 65* 78*  ALKPHOS 205* 163* 187* 223* 244*  BILITOT 1.5* 1.2 1.3* 0.6 0.6  PROT 7.8 6.4* 7.0 7.1 7.3  ALBUMIN 2.7* 2.3* 2.4* 2.5* 2.6*   Recent Labs  Lab 10/12/17 1242  LIPASE 18   No results for input(s): AMMONIA in the last 168 hours. Coagulation Profile: Recent Labs  Lab 10/12/17 0603 10/13/17 0333  INR 4.23* 1.74   Cardiac Enzymes: Recent Labs  Lab 10/14/17 0842  CKTOTAL 68   BNP (last 3 results) No results for input(s): PROBNP in the last 8760 hours. HbA1C: Recent Labs    10/17/17 0430  HGBA1C 6.5*   CBG: Recent Labs  Lab 10/16/17 0739 10/16/17 1153 10/16/17 1626 10/16/17 2055  10/17/17 0728  GLUCAP 147* 157* 112* 240* 195*   Lipid Profile: No results for input(s): CHOL, HDL, LDLCALC, TRIG, CHOLHDL, LDLDIRECT in the last 72 hours. Thyroid Function Tests: No results for input(s): TSH, T4TOTAL, FREET4, T3FREE, THYROIDAB in the last 72 hours. Anemia Panel: No results for input(s): VITAMINB12, FOLATE, FERRITIN, TIBC, IRON, RETICCTPCT in the last 72 hours. Sepsis Labs: Recent Labs  Lab 10/12/17 0603 10/12/17 0649 10/12/17 0908  LATICACIDVEN 1.9 1.97* 1.60    Recent Results (from the past 240 hour(s))  Culture, blood (Routine x 2)     Status: None   Collection Time: 10/12/17  6:03 AM  Result Value Ref Range Status   Specimen Description   Final    PORTA CATH LEFT CHEST Performed at [facility] [address] [place], [place] [address]    Special Requests   Final    BOTTLES DRAWN AEROBIC AND ANAEROBIC Blood Culture adequate volume Performed at [facility] [address] [place], [place] [address]    Culture   Final    NO GROWTH 5 DAYS Performed at [facility], [place] [address], [place], [place] [address]    Report Status 10/17/2017 FINAL  Final  Urine culture     Status: Abnormal   Collection Time: 10/12/17  6:03 AM  Result Value Ref Range Status   Specimen Description   Final    URINE, RANDOM Performed at [facility] [address], [place], [place] [address]    Special Requests   Final    NONE Performed at [facility], [place] [address] [place], [place] [address]    Culture (A)  Final    >=100,000 COLONIES/mL STAPHYLOCOCCUS SPECIES (COAGULASE NEGATIVE)   Report Status 10/14/2017 FINAL  Final   Organism ID, Bacteria STAPHYLOCOCCUS SPECIES (COAGULASE NEGATIVE) (A)  Final      Susceptibility   Staphylococcus species (coagulase negative) - MIC*    CIPROFLOXACIN >=8 RESISTANT Resistant     GENTAMICIN <=0.5 SENSITIVE Sensitive     NITROFURANTOIN <=16 SENSITIVE  Sensitive     OXACILLIN <=0.25 SENSITIVE Sensitive     TETRACYCLINE <=1 SENSITIVE Sensitive     VANCOMYCIN <=0.5 SENSITIVE Sensitive     TRIMETH/SULFA <=10 SENSITIVE Sensitive     CLINDAMYCIN RESISTANT Resistant     RIFAMPIN <=0.5 SENSITIVE Sensitive     Inducible Clindamycin POSITIVE Resistant     * >=100,000 COLONIES/mL STAPHYLOCOCCUS SPECIES (COAGULASE NEGATIVE)  Blood Culture (routine  x 2)     Status: None   Collection Time: 10/12/17 12:42 PM  Result Value Ref Range Status   Specimen Description   Final    BLOOD SITE NOT SPECIFIED Performed at [facility], [address] [place], [place] [address]    Special Requests   Final    BOTTLES DRAWN AEROBIC AND ANAEROBIC Blood Culture adequate volume Performed at [facility] [address], [place], [place] [address]    Culture   Final    NO GROWTH 5 DAYS Performed at [facility], [place] [address] [place], [place] [address]    Report Status 10/17/2017 FINAL  Final         Radiology Studies: No results found.      Scheduled Meds: . cephALEXin  500 mg Oral Q12H  . dexamethasone  4 mg Oral BID  . docusate sodium  100 mg Oral Daily  . doxycycline  100 mg Oral Q12H  . gabapentin  300 mg Oral TID  . insulin aspart  0-9 Units Subcutaneous TID WC  . irbesartan  75 mg Oral Daily  . methocarbamol  500 mg Oral TID  . oxyCODONE  30 mg Oral Q12H  . polyethylene glycol  17 g Oral Daily  . rivaroxaban  20 mg Oral Q supper  . senna-docusate  1 tablet Oral BID  . sodium chloride flush  10-40 mL Intracatheter Q12H   Continuous Infusions: . magnesium sulfate 1 - 4 g bolus IVPB 4 g (10/17/17 1043)     LOS: 5 days    Time spent: 35 minutes    Irine Seal, MD [facility] Pager [phone]  If 7PM-7AM, please contact night-coverage [email or web address] Password [facility] 10/17/2017, 10:47 AM

## 2017-10-18 ENCOUNTER — Telehealth: Payer: Self-pay | Admitting: *Deleted

## 2017-10-18 ENCOUNTER — Encounter: Payer: Self-pay | Admitting: *Deleted

## 2017-10-18 ENCOUNTER — Ambulatory Visit
Admit: 2017-10-18 | Discharge: 2017-10-18 | Disposition: A | Discharge status: Home / Self Care | Payer: Medicare Other | Attending: Radiation Oncology | Admitting: Radiation Oncology

## 2017-10-18 DIAGNOSIS — E43 Unspecified severe protein-calorie malnutrition: Secondary | ICD-10-CM

## 2017-10-18 DIAGNOSIS — R531 Weakness: Secondary | ICD-10-CM

## 2017-10-18 DIAGNOSIS — E118 Type 2 diabetes mellitus with unspecified complications: Secondary | ICD-10-CM

## 2017-10-18 DIAGNOSIS — A419 Sepsis, unspecified organism: Principal | ICD-10-CM

## 2017-10-18 LAB — GLUCOSE, CAPILLARY
GLUCOSE-CAPILLARY: 125 mg/dL — AB (ref 70–99)
Glucose-Capillary: 178 mg/dL — ABNORMAL HIGH (ref 70–99)

## 2017-10-18 LAB — BASIC METABOLIC PANEL
Anion gap: 10 (ref 5–15)
BUN: 12 mg/dL (ref 6–20)
CALCIUM: 8.8 mg/dL — AB (ref 8.9–10.3)
CO2: 28 mmol/L (ref 22–32)
CREATININE: 0.53 mg/dL (ref 0.44–1.00)
Chloride: 100 mmol/L (ref 98–111)
GLUCOSE: 174 mg/dL — AB (ref 70–99)
Potassium: 5.3 mmol/L — ABNORMAL HIGH (ref 3.5–5.1)
Sodium: 138 mmol/L (ref 135–145)

## 2017-10-18 LAB — CBC
HCT: 30 % — ABNORMAL LOW (ref 36.0–46.0)
Hemoglobin: 9.5 g/dL — ABNORMAL LOW (ref 12.0–15.0)
MCH: 26.2 pg (ref 26.0–34.0)
MCHC: 31.7 g/dL (ref 30.0–36.0)
MCV: 82.9 fL (ref 78.0–100.0)
PLATELETS: 73 10*3/uL — AB (ref 150–400)
RBC: 3.62 MIL/uL — AB (ref 3.87–5.11)
RDW: 19.6 % — ABNORMAL HIGH (ref 11.5–15.5)
WBC: 9.1 10*3/uL (ref 4.0–10.5)

## 2017-10-18 LAB — POTASSIUM: POTASSIUM: 4.1 mmol/L (ref 3.5–5.1)

## 2017-10-18 LAB — MAGNESIUM: MAGNESIUM: 2 mg/dL (ref 1.7–2.4)

## 2017-10-18 MED ORDER — IRBESARTAN 150 MG PO TABS
75.0000 mg | ORAL_TABLET | Freq: Every day | ORAL | Status: DC
  Administered 2017-10-18: 75 mg via ORAL
  Filled 2017-10-18: qty 1

## 2017-10-18 MED ORDER — METHOCARBAMOL 500 MG PO TABS: 500.0000 mg | ORAL_TABLET | Freq: Three times a day (TID) | ORAL | 0 refills | Status: AC

## 2017-10-18 MED ORDER — GABAPENTIN 300 MG PO CAPS: 300.0000 mg | ORAL_CAPSULE | Freq: Three times a day (TID) | ORAL | 0 refills | Status: AC

## 2017-10-18 MED ORDER — OXYCODONE HCL 10 MG PO TABS: 10.0000 mg | ORAL_TABLET | ORAL | 0 refills | Status: AC | PRN

## 2017-10-18 MED ORDER — OXYCODONE HCL ER 30 MG PO T12A: 30.0000 mg | EXTENDED_RELEASE_TABLET | Freq: Two times a day (BID) | ORAL | 0 refills | Status: AC

## 2017-10-18 MED ORDER — DEXAMETHASONE 4 MG PO TABS: 4.0000 mg | ORAL_TABLET | Freq: Two times a day (BID) | ORAL | 0 refills | Status: AC

## 2017-10-18 MED ORDER — SENNOSIDES-DOCUSATE SODIUM 8.6-50 MG PO TABS: 1.0000 | ORAL_TABLET | Freq: Two times a day (BID) | ORAL | Status: AC

## 2017-10-18 MED ORDER — POLYETHYLENE GLYCOL 3350 17 G PO PACK: 17.0000 g | PACK | Freq: Two times a day (BID) | ORAL | Status: DC

## 2017-10-18 MED ORDER — HEPARIN SOD (PORK) LOCK FLUSH 100 UNIT/ML IV SOLN: 500.0000 [IU] | INTRAVENOUS | Status: DC | PRN

## 2017-10-18 NOTE — Telephone Encounter (Signed)
Received order per Dr. Lindi Adie for [facility] One and PDL1 testing. Called Varney Biles in pathology to give order on liver core bx from 05/2017 for testing.

## 2017-10-18 NOTE — Care Management Note (Signed)
Case Management Note  Patient Details  Name: Debbie Martinez MRN: [id number] Date of Birth: April 30, 1975  Subjective/Objective:                    Action/Plan: Plan to discharge home with daughter and [place].   Expected Discharge Date:                  Expected Discharge Plan:  [place]  In-House Referral:     Discharge planning Services  CM Consult  Post Acute Care Choice:    Choice offered to:  Patient  DME Arranged:    DME Agency:     HH Arranged:  RN, PT, Nurse's Aide [place] Agency:  [place]  Status of Service:  Completed, signed off  If discussed at [place] of Stay Meetings, dates discussed:    Additional CommentsPurcell Mouton, RN 10/18/2017, 3:15 PM

## 2017-10-18 NOTE — Progress Notes (Signed)
   10/18/17 1600  Advance Directives (For Healthcare)  Does Patient Have a Medical Advance Directive? Yes  Type of Paramedic of [place];Living will  Copy of [place] in Chart? Yes  Copy of Living Will in Chart? Yes    Notarized document with patient.  Pt had completed this document with family and another provider.   I went over living will with pt to ensure that she had completed it to her satisfaction.   Pt with original and copies.  Copy placed in patient chart.     [facility] / [facility] Chaplain Jerene Pitch, MDiv, [facility]

## 2017-10-18 NOTE — Discharge Summary (Signed)
Physician Discharge Summary  Debbie Martinez IWP:[id number] DOB: Sep 10, 1975 DOA: 10/12/2017  PCP: Debbie Lei, MD  Admit date: 10/12/2017 Discharge date: 10/18/2017  Time spent: 60 minutes  Recommendations for Outpatient Follow-up:  1. Follow-up with Debbie Lei, MD in 2 weeks.  On follow-up patient will need a basic metabolic profile done to follow-up on electrolytes and renal function.  Patient's blood pressure need to be reassessed at that time. 2. Follow-up with Debbie Martinez in 1 to 2 weeks or as scheduled. 3. Follow-up in outpatient radiation oncology.   Discharge Diagnoses:  Principal Problem:   Right-sided chest wall pain Active Problems:   Breast cancer of upper-inner quadrant of right female breast (HCC)   Breast carcinoma metastatic to multiple sites ([place])   Hypertension   Anxiety   Bone metastasis (HCC)   Cancer associated pain   DNR (do not resuscitate) discussion   Diabetes ([place])   Pulmonary embolism (HCC)   Nausea with vomiting   Protein-calorie malnutrition, severe ([place])   Anemia of chronic disease   RUQ abdominal pain   Palliative care by specialist   Weakness generalized   Sepsis ([place])   Discharge Condition: Stable and improved  Diet recommendation: Carb modified  Filed Weights   10/12/17 0554  Weight: 70.3 kg (155 lb)    History of present illness:  Per Dr Debbie Martinez is a 42 y.o. female with medical history significant of breast cancer with metastases, diabetes, pulmonary embolism on Xarelto with 2-day history of nausea, vomiting, right upper quadrant abdominal pain.  She also admitted to right-sided chest pain that radiates from her abdomen, worse with cough.  She described her abdominal pain as a squeezing constant pain.  She had vomited about 3 times so far.  She also admitted to fevers and chills, she was febrile at 101.9 in the emergency department.  She admitted to some dysuria, was diagnosed with a UTI as an outpatient and was  started on Cipro.  Her dysuria has been improving.  She also admitted to some diarrhea.  ED Course: In the emergency department, she was febrile 101.9, tachycardic 145, tachypneic 28.  She was satting 96% on room air.  Labs revealed elevated liver enzymes AST 69, ALT 80, total bilirubin 1.5.  Lactic acid 1.9.  WBC 7.3.  INR 4.23.  Chest x-ray without acute cardiopulmonary disease.  Right upper quadrant ultrasound showed mild gallbladder sludge, mild edematous gallbladder wall thickening.  General surgery was consulted for concern for cholecystitis.    Hospital Course:  1. Right-sided chest wall andright upper quadrantpain. Health care associated pneumonia Coagulase negative staph UTI Metastatic breast cancer with metastasis to liver Patient presented with severe right-sided pleuritic chest and right upper quadrant abdominal pain, with nausea and vomiting.   Initial concern was for acute cholecystitis and patient underwent ultrasound of the gallbladder which was also showing positive gallbladder sludge and edematous gallbladder. General surgery was consulted and patient underwent HIDA scan. HIDA scan was negative, cultures are so far negative as well and she does not have any right upper quadrant pain or Murphy sign on my examination. CT scan made a comment for possible right middle lobe and upper lobe infiltrate although based on prior discussion with radiology it appears more like radiation-induced injury.  Patient's pain likely secondary to metastatic breast cancer with progression of disease to the liver and lymphadenopathy leading to right upper quadrant abdominal pain.  Patient with some improvement with pain control however still with increased pain and required  increased doses of IV morphine.  OxyContin increased to 30 mg twice daily for long-acting pain control and oxycodone increased to 10 mg every 4 hours as needed for breakthrough pain per palliative care.    Patient started on  Decadron and gabapentin.  Patient also placed empirically on IV Zosyn for probable pneumonia and UTI.  Patient subsequently transition to oral Keflex and doxycycline and received a full course of antibiotic treatment.  Patient was seen by palliative care throughout the hospitalization.  Patient will follow up with her oncologist in the outpatient setting.  2. Metastatic breast cancer. Patient with progression of disease including lymphadenopathy, mets to the liver leading to right upper quadrant abdominal pain.  Patient seen in consultation by oncology who recommended radiation oncology consultation to assess patient for palliative radiation to help with pain management.  Radiation oncology has assessed patient patient underwent simulation the patient started radiation treatment during the hospitalization we will follow-up in the outpatient setting.  Patient was started on pain regimen for better pain control. OxyContin dose increased to 30 mg twice daily for long-acting pain regimen and oxycodone increased to 10 mg every 4 hours as needed for breakthrough pain per palliative care.  Continue IV morphine as needed.  Patient's pain was better controlled on this regimen.  Patient was seen by oncology as well as palliative care throughout the hospitalization.  Patient will follow-up with oncology in the outpatient setting for outpatient chemotherapy for at least 3 cycle of current regimen.  3.   History of recent PE.  Patient started on Xarelto in May 2019.  Continued Xarelto.   4 anemia Likely chemotherapy-induced.  Patient on Xarelto.  Patient with no overt bleeding.    Hemoglobin stabilized at 9.5.  Patient did receive a unit of packed red blood cells.  Outpatient follow-up.    5. Diabetes mellitus. Hemoglobin A1c is 6.5.    Patient noted to have elevated CBGs as she was started on Decadron.  Patient was maintained on sliding scale insulin throughout the hospitalization.  Outpatient follow-up with  PCP.   6. Sinus tachycardia / hypertension. Tachycardia likely secondary to pain.    Patient was maintained on Avapro for hypertension.  Sinus tachycardia improved with pain management.  Outpatient follow-up.        Procedures:  CT angio chest 10/13/2017  Chest x-ray 10/12/2017  HIDA scan 10/12/2017  Right upper quadrant ultrasound 10/12/2017      Consultations:  General surgery: Dr. Barry Dienes 10/12/2017  Radiation oncology: Dr. Isidore Moos 10/14/2017  Medical oncology: Debbie Martinez 10/14/2017  Palliative care: Wadie Lessen, NP 10/16/2017      Discharge Exam: Vitals:   10/18/17 0502 10/18/17 1332  BP: 104/66 126/87  Pulse: 81 85  Resp: 16 16  Temp: 98.2 F (36.8 C) 98.2 F (36.8 C)  SpO2: 99% 98%    General: NAD Cardiovascular: RRR Respiratory: CTAB  Discharge Instructions   Discharge Instructions    Diet Carb Modified   Complete by:  As directed    Increase activity slowly   Complete by:  As directed      Allergies as of 10/18/2017      Reactions   Lisinopril Swelling, Cough   Swelling under eyes and eyelids    Tamoxifen Itching      Medication List    STOP taking these medications   ciprofloxacin 250 MG tablet Commonly known as:  CIPRO   fentaNYL 25 MCG/HR patch Commonly known as:  DURAGESIC - dosed mcg/hr   oxyCODONE-acetaminophen  5-325 MG tablet Commonly known as:  PERCOCET/ROXICET   traMADol 50 MG tablet Commonly known as:  ULTRAM     TAKE these medications   ACCU-CHEK AVIVA PLUS test strip Generic drug:  glucose blood USE AS DIRECTED 4 TIMES A DAY   ACCU-CHEK SOFTCLIX LANCETS lancets 100 each by Other route 4 (four) times daily. Use as instructed   acetaminophen 500 MG tablet Commonly known as:  TYLENOL Take 1,000 mg by mouth every 4 (four) hours as needed for moderate pain.   blood glucose meter kit and supplies Kit Dispense based on patient and insurance preference. Use up to four times daily as directed. (FOR ICD-9 250.00,  250.01).   BREO ELLIPTA IN Inhale 1 puff into the lungs daily as needed (Shortness of breath).   dexamethasone 4 MG tablet Commonly known as:  DECADRON Take 1 tablet (4 mg total) by mouth 2 (two) times daily.   ENSURE Take 237 mLs by mouth 2 (two) times daily between meals.   fluticasone 50 MCG/ACT nasal spray Commonly known as:  FLONASE Place 2 sprays into both nostrils 2 (two) times daily as needed for allergies.   gabapentin 300 MG capsule Commonly known as:  NEURONTIN Take 1 capsule (300 mg total) by mouth 3 (three) times daily. What changed:  Another medication with the same name was removed. Continue taking this medication, and follow the directions you see here.   IRON PO Take 1 tablet by mouth 2 (two) times daily.   JANUMET 50-500 MG tablet Generic drug:  sitaGLIPtin-metformin Take 1 tablet by mouth daily.   Lidocaine-Prilocaine (Bulk) 2.5-2.5 % Crea Apply 5 mLs topically as needed. 2 hours before use, cover with plawtic wrap.   lidocaine-prilocaine cream Commonly known as:  EMLA Apply to affected area once   LORazepam 0.5 MG tablet Commonly known as:  ATIVAN Take 1 tablet (0.5 mg total) by mouth at bedtime as needed (Nausea or vomiting).   methocarbamol 500 MG tablet Commonly known as:  ROBAXIN Take 1 tablet (500 mg total) by mouth 3 (three) times daily.   MULTIVITAMIN ADULT Tabs Take 1 tablet by mouth daily.   omeprazole 20 MG capsule Commonly known as:  PRILOSEC Take 1 capsule (20 mg total) by mouth daily. Take while using dexamethasone to prevent heartburn. What changed:    when to take this  reasons to take this  additional instructions   ondansetron 8 MG tablet Commonly known as:  ZOFRAN Take 1 tablet (8 mg total) by mouth 2 (two) times daily as needed for refractory nausea / vomiting. Start on day 3 after chemo.   Oxycodone HCl 10 MG Tabs Take 1 tablet (10 mg total) by mouth every 4 (four) hours as needed for moderate pain.   oxyCODONE 30  MG 12 hr tablet Take 1 tablet (30 mg total) by mouth every 12 (twelve) hours.   polyethylene glycol packet Commonly known as:  MIRALAX / GLYCOLAX Take 17 g by mouth daily. What changed:    when to take this  reasons to take this   prochlorperazine 10 MG tablet Commonly known as:  COMPAZINE Take 1 tablet (10 mg total) by mouth every 6 (six) hours as needed (Nausea or vomiting).   rivaroxaban 20 MG Tabs tablet Commonly known as:  XARELTO Take 1 tablet (20 mg total) by mouth daily with supper. What changed:  Another medication with the same name was removed. Continue taking this medication, and follow the directions you see here.   senna-docusate 8.6-50  MG tablet Commonly known as:  Senokot-S Take 1 tablet by mouth 2 (two) times daily.   valsartan 80 MG tablet Commonly known as:  DIOVAN Take 1 tablet (80 mg total) by mouth daily.   VITAMIN D-3 PO Take 1 capsule by mouth daily.      Allergies  Allergen Reactions  . Lisinopril Swelling and Cough    Swelling under eyes and eyelids    . Tamoxifen Itching   Follow-up Information    Debbie Lei, MD. Schedule an appointment as soon as possible for a visit in 2 week(s).   Specialty:  Family Medicine Contact information: [address] [place] [address] [phone]        [name], MD. Schedule an appointment as soon as possible for a visit in 1 week(s).   Specialty:  Hematology and Oncology Why:  f/u in 1-2 weeks or as scheduled. Contact information: [place] [phone] [facility] Follow up on 10/21/2017.   Specialty:  Radiation Oncology Why:  f/u as scheduled Contact information: [place] [id number] [place] [address] [phone]           The results of significant diagnostics from this hospitalization (including imaging, microbiology, ancillary and laboratory) are  listed below for reference.    Significant Diagnostic Studies: Ct Angio Chest Pe W Or Wo Contrast  Result Date: 10/13/2017 CLINICAL DATA:  Recent diagnosis of pulmonary embolism. History of metastatic breast cancer. Evaluate for acute or chronic PE. EXAM: CT ANGIOGRAPHY CHEST WITH CONTRAST TECHNIQUE: Multidetector CT imaging of the chest was performed using the standard protocol during bolus administration of intravenous contrast. Multiplanar CT image reconstructions and MIPs were obtained to evaluate the vascular anatomy. CONTRAST:  178m ISOVUE-370 IOPAMIDOL (ISOVUE-370) INJECTION 76% COMPARISON:  Chest CTA - 09/04/2017 FINDINGS: Vascular Findings: There is adequate opacification of the pulmonary arterial system with the main pulmonary artery measuring 277 Hounsfield units. No evidence of acute or chronic pulmonary embolism special attention paid to the right lower lobe segmental and subsegmental pulmonary arteries at location of previously identified residual pulmonary embolism. Normal caliber the main pulmonary artery. Normal heart size.  No pericardial effusion. Normal caliber of the thoracic aorta. No definite evidence of thoracic aortic dissection or periaortic stranding on this nongated examination. Conventional configuration of the aortic arch. The branch vessels of the aortic arch appear widely patent throughout their imaged course. Left subclavian vein approach port a catheter tip terminates within the superior cavoatrial junction. Review of the MIP images confirms the above findings. ---------------------------------------------------------------------------------- Nonvascular Findings: Mediastinum/Lymph Nodes: Worsening mediastinal and bilateral hilar lymphadenopathy with index prevascular nodal conglomeration measuring approximately 1.3 cm in greatest short axis diameter (image 28, series 4, previously, 0.6 cm, index subcarinal nodal conglomeration now measuring approximately 3.5 x 1.7 cm (image  36), previously, 1.7 x 1.1 cm, index right suprahilar nodal conglomeration measuring 1.5 cm, previously, 0.9 cm and index left infrahilar nodal conglomeration now measuring 1 cm (image 36, series 4), previously, 0.3 cm. Lungs/Pleura: Interval development of a small right and trace left-sided pleural effusions. Worsening masslike consolidative opacities within the anterior subpleural aspects of the right upper and middle lobes (image 56, series 6). Interval development of multiple new bilateral pulmonary nodules with index nodule within the right upper lobe measuring 0.5 cm in greatest short axis diameter (image 39, series 6), index right middle lobe pulmonary nodule measuring 0.7 cm (image  60), index left lower lobe pulmonary nodule measuring 0.5 cm (image 520 and development of multiple punctate (sub 5 mm) pulmonary nodule within the bilateral lung apices. Upper abdomen: Progression of known hepatic metastatic disease with index lesion within the medial segment of the left lobe liver now measuring 3.7 cm in greatest short axis diameter (image 69, series 4, previously, 2.6 cm and index lesion within the subcapsular aspect of the anterior segment right lobe of liver measuring 2.6 cm (image 69), previously,2.0 cm. Musculoskeletal: Re demonstrated extensive osseous metastatic disease with multiple mixed lytic and sclerotic nodule seen throughout the thoracic spine and within the sternum. Unchanged approximately 3.6 cm presumed seroma within the upper inner quadrant of the right breast. IMPRESSION: 1. No evidence of acute or chronic pulmonary embolism. 2. Findings compatible with rapid progression of metastatic disease within the chest with development of multiple bilateral pulmonary nodules and worsening mediastinal and hilar adenopathy. 3. Interval development of small right and trace left-sided pleural effusions. 4. Continued progression of known hepatic metastatic disease. 5. Grossly unchanged sequela of advanced  osseous metastatic disease, primarily affecting the thoracic spine. Electronically Signed   By: Sandi Mariscal M.D.   On: 10/13/2017 13:15   Nm Hepatobiliary Liver Func  Result Date: 10/12/2017 CLINICAL DATA:  Right upper quadrant abdominal pain. EXAM: NUCLEAR MEDICINE HEPATOBILIARY IMAGING TECHNIQUE: Sequential images of the abdomen were obtained out to 60 minutes following intravenous administration of radiopharmaceutical. RADIOPHARMACEUTICALS:  5.4 mCi Tc-87m Choletec IV COMPARISON:  CT scan of Sep 02, 2016. FINDINGS: Prompt uptake and biliary excretion of activity by the liver is seen. Gallbladder activity is visualized, consistent with patency of cystic duct. Biliary activity passes into small bowel, consistent with patent common bile duct. IMPRESSION: Normal uptake within the gallbladder.  No evidence of cholecystitis. Electronically Signed   By: JMarijo Conception M.D.   On: 10/12/2017 19:41   Dg Chest Portable 1 View  Result Date: 10/12/2017 CLINICAL DATA:  Nausea, vomiting and diarrhea beginning yesterday. History of breast cancer. Patient on ciprofloxacin for UTI over the past week. EXAM: PORTABLE CHEST 1 VIEW COMPARISON:  09/04/2017 FINDINGS: Left subclavian Port-A-Cath has tip just below the cavoatrial junction. Lungs are hypoinflated without focal airspace consolidation or effusion. Cardiomediastinal silhouette and remainder of the exam is unchanged. IMPRESSION: Hypoinflation without acute cardiopulmonary disease. Electronically Signed   By: DMarin OlpM.D.   On: 10/12/2017 07:13   UKoreaAbdomen Limited Ruq  Result Date: 10/12/2017 CLINICAL DATA:  Chest pain with nausea and vomiting 1 week. Patient [redacted] weeks pregnant. Metastatic breast cancer. EXAM: ULTRASOUND ABDOMEN LIMITED RIGHT UPPER QUADRANT COMPARISON:  CT 09/02/2017 FINDINGS: Gallbladder: There is a mild amount of gallbladder sludge present. No definite gallstones. There is mild edematous gallbladder wall thickening measuring 5.9 mm.  Negative sonographic Murphy's sign. No adjacent free fluid. Common bile duct: Diameter: 4.3 mm. Liver: Mild heterogeneous parenchymal pattern with 6.1 cm oval hypoechoic mass likely representing patient's known metastatic liver disease. Portal vein is patent on color Doppler imaging with normal direction of blood flow towards the liver. IMPRESSION: Mild gallbladder sludge with mild edematous gallbladder wall thickening. Recommend clinical correlation for acute cholecystitis. Heterogeneous hepatic parenchymal pattern with 6.1 cm hypoechoic mass present. This is likely due to patient's known metastatic breast cancer. Electronically Signed   By: DMarin OlpM.D.   On: 10/12/2017 09:55    Microbiology: Recent Results (from the past 240 hour(s))  Culture, blood (Routine x 2)     Status: None  Collection Time: 10/12/17  6:03 AM  Result Value Ref Range Status   Specimen Description   Final    PORTA CATH LEFT CHEST Performed at [facility] [address], [place], [place] [address]    Special Requests   Final    BOTTLES DRAWN AEROBIC AND ANAEROBIC Blood Culture adequate volume Performed at [facility] [address] [place], [place] [address]    Culture   Final    NO GROWTH 5 DAYS Performed at [facility], [place] [address], [place], [place] [address]    Report Status 10/17/2017 FINAL  Final  Urine culture     Status: Abnormal   Collection Time: 10/12/17  6:03 AM  Result Value Ref Range Status   Specimen Description   Final    URINE, RANDOM Performed at [facility] [address] [place], [place] [address]    Special Requests   Final    NONE Performed at [facility], [place] [address] [place], [place] [address]    Culture (A)  Final    >=100,000 COLONIES/mL STAPHYLOCOCCUS SPECIES (COAGULASE NEGATIVE)   Report Status 10/14/2017 FINAL  Final   Organism ID, Bacteria STAPHYLOCOCCUS SPECIES (COAGULASE  NEGATIVE) (A)  Final      Susceptibility   Staphylococcus species (coagulase negative) - MIC*    CIPROFLOXACIN >=8 RESISTANT Resistant     GENTAMICIN <=0.5 SENSITIVE Sensitive     NITROFURANTOIN <=16 SENSITIVE Sensitive     OXACILLIN <=0.25 SENSITIVE Sensitive     TETRACYCLINE <=1 SENSITIVE Sensitive     VANCOMYCIN <=0.5 SENSITIVE Sensitive     TRIMETH/SULFA <=10 SENSITIVE Sensitive     CLINDAMYCIN RESISTANT Resistant     RIFAMPIN <=0.5 SENSITIVE Sensitive     Inducible Clindamycin POSITIVE Resistant     * >=100,000 COLONIES/mL STAPHYLOCOCCUS SPECIES (COAGULASE NEGATIVE)  Blood Culture (routine x 2)     Status: None   Collection Time: 10/12/17 12:42 PM  Result Value Ref Range Status   Specimen Description   Final    BLOOD SITE NOT SPECIFIED Performed at [facility], [address], [place], [place] [address]    Special Requests   Final    BOTTLES DRAWN AEROBIC AND ANAEROBIC Blood Culture adequate volume Performed at [facility] [address] [place], [place] [address]    Culture   Final    NO GROWTH 5 DAYS Performed at [facility], [place] [address], [place], [place] [address]    Report Status 10/17/2017 FINAL  Final     Labs: Basic Metabolic Panel: Recent Labs  Lab 10/14/17 0842 10/15/17 0438 10/16/17 0917 10/17/17 0430 10/18/17 0449 10/18/17 1008  NA 136 140 140 136 138  --   K 3.9 4.6 4.6 4.5 5.3* 4.1  CL 103 107 106 99 100  --   CO2 '23 23 22 25 28  '$ --   GLUCOSE 106* 205* 127* 172* 174*  --   BUN '6 11 15 15 12  '$ --   CREATININE 0.41* 0.54 0.43* 0.51 0.53  --   CALCIUM 7.8* 9.2 9.2 9.0 8.8*  --   MG  --  1.7 1.5* 1.7 2.0  --    Liver Function Tests: Recent Labs  Lab 10/12/17 0603 10/13/17 0333 10/14/17 0842 10/15/17 0438 10/16/17 0917  AST 69* 44* 46* 80* 78*  ALT 80* 54 46 65* 78*  ALKPHOS 205* 163* 187* 223* 244*  BILITOT 1.5* 1.2  1.3* 0.6 0.6  PROT 7.8 6.4* 7.0 7.1 7.3  ALBUMIN 2.7* 2.3* 2.4* 2.5* 2.6*   Recent  Labs  Lab 10/12/17 1242  LIPASE 18   No results for input(s): AMMONIA in the last 168 hours. CBC: Recent Labs  Lab 10/12/17 0603  10/14/17 0842 10/15/17 0438 10/16/17 0917 10/17/17 0430 10/18/17 0449  WBC 7.3   < > 6.1 7.3 10.7* 9.1 9.1  NEUTROABS 5.4  --  4.6 6.0 8.6*  --   --   HGB 7.4*   < > 8.8* 9.0* 9.5* 9.4* 9.5*  HCT 23.7*   < > 27.1* 28.1* 29.5* 28.9* 30.0*  MCV 81.2   < > 81.4 82.2 82.6 82.1 82.9  PLT 196   < > 109* 101* 97* 83* 73*   < > = values in this interval not displayed.   Cardiac Enzymes: Recent Labs  Lab 10/14/17 0842  CKTOTAL 68   BNP: BNP (last 3 results) No results for input(s): BNP in the last 8760 hours.  ProBNP (last 3 results) No results for input(s): PROBNP in the last 8760 hours.  CBG: Recent Labs  Lab 10/17/17 1206 10/17/17 1652 10/17/17 2101 10/18/17 0759 10/18/17 1216  GLUCAP 264* 275* 102* 125* 178*       Signed:  Irine Seal MD.  [facility] 10/18/2017, 3:43 PM

## 2017-10-21 ENCOUNTER — Other Ambulatory Visit: Payer: Self-pay | Admitting: Medical

## 2017-10-21 ENCOUNTER — Other Ambulatory Visit: Payer: Self-pay | Admitting: Radiation Oncology

## 2017-10-21 ENCOUNTER — Ambulatory Visit
Admission: RE | Admit: 2017-10-21 | Discharge: 2017-10-21 | Disposition: A | Discharge status: Home / Self Care | Payer: Medicare Other | Source: Ambulatory Visit | Attending: Radiation Oncology | Admitting: Radiation Oncology

## 2017-10-21 DIAGNOSIS — R531 Weakness: Secondary | ICD-10-CM

## 2017-10-21 DIAGNOSIS — C50911 Malignant neoplasm of unspecified site of right female breast: Secondary | ICD-10-CM | POA: Diagnosis not present

## 2017-10-21 DIAGNOSIS — Z510 Encounter for antineoplastic radiation therapy: Secondary | ICD-10-CM | POA: Diagnosis not present

## 2017-10-21 DIAGNOSIS — C7951 Secondary malignant neoplasm of bone: Secondary | ICD-10-CM

## 2017-10-21 DIAGNOSIS — C787 Secondary malignant neoplasm of liver and intrahepatic bile duct: Secondary | ICD-10-CM | POA: Diagnosis not present

## 2017-10-21 DIAGNOSIS — C50919 Malignant neoplasm of unspecified site of unspecified female breast: Secondary | ICD-10-CM

## 2017-10-21 DIAGNOSIS — R000 Tachycardia, unspecified: Secondary | ICD-10-CM

## 2017-10-21 DIAGNOSIS — Z170 Estrogen receptor positive status [ER+]: Secondary | ICD-10-CM | POA: Diagnosis not present

## 2017-10-21 MED ORDER — OMEPRAZOLE 20 MG PO CPDR: 20.0000 mg | DELAYED_RELEASE_CAPSULE | Freq: Every day | ORAL | 0 refills | Status: DC

## 2017-10-21 MED ORDER — ONDANSETRON HCL 8 MG PO TABS: ORAL_TABLET | ORAL | 3 refills | Status: AC

## 2017-10-22 ENCOUNTER — Inpatient Hospital Stay: Payer: Medicare Other | Attending: Hematology and Oncology

## 2017-10-22 ENCOUNTER — Ambulatory Visit: Payer: Medicare Other

## 2017-10-22 ENCOUNTER — Ambulatory Visit: Payer: Medicare Other | Admitting: Hematology and Oncology

## 2017-10-22 ENCOUNTER — Ambulatory Visit
Admission: RE | Admit: 2017-10-22 | Discharge: 2017-10-22 | Disposition: A | Discharge status: Home / Self Care | Payer: Medicare Other | Source: Ambulatory Visit | Attending: Radiation Oncology | Admitting: Radiation Oncology

## 2017-10-22 ENCOUNTER — Other Ambulatory Visit: Payer: Medicare Other

## 2017-10-22 ENCOUNTER — Telehealth: Payer: Self-pay

## 2017-10-22 ENCOUNTER — Inpatient Hospital Stay (HOSPITAL_BASED_OUTPATIENT_CLINIC_OR_DEPARTMENT_OTHER): Payer: Medicare Other | Admitting: Medical

## 2017-10-22 VITALS — BP 116/84 | HR 131 | Temp 98.4°F | Resp 20 | Wt 149.4 lb

## 2017-10-22 DIAGNOSIS — C7951 Secondary malignant neoplasm of bone: Secondary | ICD-10-CM | POA: Diagnosis not present

## 2017-10-22 DIAGNOSIS — R531 Weakness: Secondary | ICD-10-CM

## 2017-10-22 DIAGNOSIS — C50211 Malignant neoplasm of upper-inner quadrant of right female breast: Secondary | ICD-10-CM | POA: Insufficient documentation

## 2017-10-22 DIAGNOSIS — C50919 Malignant neoplasm of unspecified site of unspecified female breast: Secondary | ICD-10-CM

## 2017-10-22 DIAGNOSIS — Z5111 Encounter for antineoplastic chemotherapy: Secondary | ICD-10-CM | POA: Insufficient documentation

## 2017-10-22 DIAGNOSIS — C787 Secondary malignant neoplasm of liver and intrahepatic bile duct: Secondary | ICD-10-CM | POA: Diagnosis not present

## 2017-10-22 DIAGNOSIS — C50911 Malignant neoplasm of unspecified site of right female breast: Secondary | ICD-10-CM | POA: Diagnosis not present

## 2017-10-22 DIAGNOSIS — R000 Tachycardia, unspecified: Secondary | ICD-10-CM

## 2017-10-22 DIAGNOSIS — Z510 Encounter for antineoplastic radiation therapy: Secondary | ICD-10-CM | POA: Diagnosis not present

## 2017-10-22 DIAGNOSIS — Z170 Estrogen receptor positive status [ER+]: Secondary | ICD-10-CM | POA: Diagnosis not present

## 2017-10-22 LAB — CMP (CANCER CENTER ONLY)
ALT: 64 U/L — AB (ref 0–44)
ANION GAP: 12 (ref 5–15)
AST: 48 U/L — ABNORMAL HIGH (ref 15–41)
Albumin: 3 g/dL — ABNORMAL LOW (ref 3.5–5.0)
Alkaline Phosphatase: 318 U/L — ABNORMAL HIGH (ref 38–126)
BUN: 18 mg/dL (ref 6–20)
CHLORIDE: 103 mmol/L (ref 98–111)
CO2: 23 mmol/L (ref 22–32)
CREATININE: 0.62 mg/dL (ref 0.44–1.00)
Calcium: 10.5 mg/dL — ABNORMAL HIGH (ref 8.9–10.3)
GFR, Estimated: >60 mL/min (ref >60)
Glucose, Bld: 128 mg/dL — ABNORMAL HIGH (ref 70–99)
Potassium: 4.6 mmol/L (ref 3.5–5.1)
SODIUM: 138 mmol/L (ref 135–145)
Total Bilirubin: 0.5 mg/dL (ref 0.3–1.2)
Total Protein: 7.9 g/dL (ref 6.5–8.1)

## 2017-10-22 LAB — CBC WITH DIFFERENTIAL (CANCER CENTER ONLY)
BASOS PCT: 0 %
Basophils Absolute: 0 10*3/uL (ref 0.0–0.1)
Eosinophils Absolute: 0 10*3/uL (ref 0.0–0.5)
Eosinophils Relative: 0 %
HCT: 35.6 % (ref 34.8–46.6)
Hemoglobin: 11.3 g/dL — ABNORMAL LOW (ref 11.6–15.9)
LYMPHS PCT: 4 %
Lymphs Abs: 0.3 10*3/uL — ABNORMAL LOW (ref 0.9–3.3)
MCH: 25.8 pg (ref 25.1–34.0)
MCHC: 31.6 g/dL (ref 31.5–36.0)
MCV: 81.6 fL (ref 79.5–101.0)
Monocytes Absolute: 0.3 10*3/uL (ref 0.1–0.9)
Monocytes Relative: 4 %
NEUTROS ABS: 6.4 10*3/uL (ref 1.5–6.5)
NEUTROS PCT: 92 %
PLATELETS: 104 10*3/uL — AB (ref 145–400)
RBC: 4.37 MIL/uL (ref 3.70–5.45)
RDW: 21.7 % — AB (ref 11.2–14.5)
WBC: 7 10*3/uL (ref 3.9–10.3)

## 2017-10-22 MED ORDER — LORAZEPAM 0.5 MG PO TABS: 0.5000 mg | ORAL_TABLET | Freq: Three times a day (TID) | ORAL | 2 refills | Status: AC

## 2017-10-22 MED FILL — LORazepam 0.5 MG TABS: 0.5 | 10 days supply | Qty: 30 | Fill #0

## 2017-10-22 NOTE — Patient Instructions (Signed)
Implanted Port Home Guide An implanted port is a type of central line that is placed under the skin. Central lines are used to provide IV access when treatment or nutrition needs to be given through a person's veins. Implanted ports are used for long-term IV access. An implanted port may be placed because:  You need IV medicine that would be irritating to the small veins in your hands or arms.  You need long-term IV medicines, such as antibiotics.  You need IV nutrition for a long period.  You need frequent blood draws for lab tests.  You need dialysis.  Implanted ports are usually placed in the chest area, but they can also be placed in the upper arm, the abdomen, or the leg. An implanted port has two main parts:  Reservoir. The reservoir is round and will appear as a small, raised area under your skin. The reservoir is the part where a needle is inserted to give medicines or draw blood.  Catheter. The catheter is a thin, flexible tube that extends from the reservoir. The catheter is placed into a large vein. Medicine that is inserted into the reservoir goes into the catheter and then into the vein.  How will I care for my incision site? Do not get the incision site wet. Bathe or shower as directed by your health care provider. How is my port accessed? Special steps must be taken to access the port:  Before the port is accessed, a numbing cream can be placed on the skin. This helps numb the skin over the port site.  Your health care provider uses a sterile technique to access the port. ? Your health care provider must put on a mask and sterile gloves. ? The skin over your port is cleaned carefully with an antiseptic and allowed to dry. ? The port is gently pinched between sterile gloves, and a needle is inserted into the port.  Only "non-coring" port needles should be used to access the port. Once the port is accessed, a blood return should be checked. This helps ensure that the port  is in the vein and is not clogged.  If your port needs to remain accessed for a constant infusion, a clear (transparent) bandage will be placed over the needle site. The bandage and needle will need to be changed every week, or as directed by your health care provider.  Keep the bandage covering the needle clean and dry. Do not get it wet. Follow your health care provider's instructions on how to take a shower or bath while the port is accessed.  If your port does not need to stay accessed, no bandage is needed over the port.  What is flushing? Flushing helps keep the port from getting clogged. Follow your health care provider's instructions on how and when to flush the port. Ports are usually flushed with saline solution or a medicine called heparin. The need for flushing will depend on how the port is used.  If the port is used for intermittent medicines or blood draws, the port will need to be flushed: ? After medicines have been given. ? After blood has been drawn. ? As part of routine maintenance.  If a constant infusion is running, the port may not need to be flushed.  How long will my port stay implanted? The port can stay in for as long as your health care provider thinks it is needed. When it is time for the port to come out, surgery will be   done to remove it. The procedure is similar to the one performed when the port was put in. When should I seek immediate medical care? When you have an implanted port, you should seek immediate medical care if:  You notice a bad smell coming from the incision site.  You have swelling, redness, or drainage at the incision site.  You have more swelling or pain at the port site or the surrounding area.  You have a fever that is not controlled with medicine.  This information is not intended to replace advice given to you by your health care provider. Make sure you discuss any questions you have with your health care provider. Document  Released: 04/09/2005 Document Revised: 09/15/2015 Document Reviewed: 12/15/2012 Elsevier Interactive Patient Education  2017 [employer]  

## 2017-10-22 NOTE — Telephone Encounter (Signed)
Printed avs and calender of upcoming appointment per 7/2

## 2017-10-22 NOTE — Progress Notes (Signed)
[facility] Progress Note   Debbie Martinez [id number] [birth year] 42 y.o.  Debbie Martinez is managed by Dr. [name]  Actively treated with chemotherapy/immunotherapy: yes  Current Therapy: Carboplatin  Assessment: Plan:    Carcinoma of breast metastatic to multiple sites, unspecified laterality [facility])  Hypercalcemia   Metastatic breast cancer: The patient will continue on radiation therapy.  She will return on 10/26/2017 for ongoing chemotherapy with carboplatin.  Labs will be collected on 10/25/2017. The patient will return to see Dr. Lindi Adie on 11/05/2017.  Hypercalcemia.  The patient's labs today returned with a calcium of 10.5.  The patient was instructed to increase her fluid intake.  Plans are for the patient to receive Zometa on 10/26/2017.  Please see After Visit Summary for patient specific instructions.  Future Appointments  Date Time Provider [place]  10/23/2017 12:30 PM [facility] [id number] [facility] None  10/25/2017  1:00 PM [facility] LAB 5 [facility] None  10/25/2017  1:30 PM [facility] [id number] [facility] None  10/26/2017 10:00 AM [facility] INFUSION [facility] None  11/05/2017  1:15 PM [facility] LAB 5 [facility] None  11/05/2017  1:30 PM [facility] [place] FLUSH [facility] None  11/05/2017  2:00 PM [name], MD [facility] None  11/05/2017  3:00 PM [facility] INFUSION [facility] None  11/05/2017  3:15 PM [name], RD [facility] None  11/26/2017  1:15 PM [facility] LAB 6 [facility] None  11/26/2017  1:30 PM [facility] [place] FLUSH [facility] None  11/26/2017  2:00 PM [name], MD [facility] None  11/26/2017  3:00 PM [facility] INFUSION [facility] None  12/06/2017  3:20 PM [name], MD [facility] None  12/17/2017  1:15 PM [facility] LAB 5 [facility] None  12/17/2017  1:30 PM [facility] [place] FLUSH [facility] None  12/17/2017  2:00 PM [name], MD [facility] None  12/17/2017  3:00 PM [facility] INFUSION  [facility] None  01/07/2018  1:15 PM [facility] LAB 6 [facility] None  01/07/2018  1:30 PM [facility] [place] FLUSH [facility] None  01/07/2018  2:00 PM [name], MD [facility] None  01/07/2018  3:00 PM [facility] INFUSION [facility] None    No orders of the defined types were placed in this encounter.      Subjective:   Patient ID:  Debbie Martinez is a 42 y.o. (DOB October 26, 1975) female.  Chief Complaint:  Chief Complaint  Patient presents with  . Emesis    HPI Debbie Martinez is a 42 year old female with a metastatic breast cancer who is managed by Dr. [name] and Dr. Isidore Moos.  She is currently receiving radiation therapy.  A message was received from Dr. Isidore Moos yesterday asking that the patient be seen today.  At that time she reported that the patient was short of breath and tachycardic.  The patient had experienced these issues during her recent hospitalization.  A CT angiogram was negative at that time.  The patient's oxygen saturation from yesterday was 100% on room air with a pulse of 130 and a respiration rate of 22.  Dr. Isidore Moos requested I see the patient in order to assess whether she was stable to be at home.  She was recently admitted from 10/12/2017 through 10/18/2017.  She was admitted for fever and chills.  She was found to have a UTI and was begun on antibiotics.  Her UTI symptoms have resolved.  Additionally the patient has a history of a pulmonary emboli and continues on Xarelto.  She had vomiting and nausea yesterday after having radiation.  She has had burning in  her epigastrium with extension into her esophagus.  She has been started on Prilosec with some improvement in her epigastric and esophageal pain.  She is afebrile and without chills, productive cough, diarrhea, or constipation.  She has a history of ongoing tachycardia and is stable.  Medications: I have reviewed the patient's current medications.  Allergies:  Allergies  Allergen Reactions  .  Lisinopril Swelling and Cough    Swelling under eyes and eyelids    . Tamoxifen Itching    Past Medical History:  Diagnosis Date  . Anxiety    gets sweaty and short of breath  . Breast cancer ([place]) 01/20/14   triple positive  . Diabetes   . GERD (gastroesophageal reflux disease)    does not take anything  . History of radiation therapy 11/03/14- 12/14/14   Right Breast, supraclavicular nodes, axillary nodes, and internal mammary nodes.  . History of radiation therapy 01/04/16- 01/23/16   Spine metastasis L2-S2  . Hypertension   . Liver metastases ([place])   . Metastatic cancer to bone ([place])   . Shortness of breath    'anxiety'    Past Surgical History:  Procedure Laterality Date  . BREAST LUMPECTOMY WITH NEEDLE LOCALIZATION AND AXILLARY SENTINEL LYMPH NODE BX Right 08/23/2014   Procedure: RIGHT BREAST LUMPECTOMY WITH NEEDLE LOCALIZATION(X'S 2)AND RIGHT AXILLARY SENTINEL LYMPH NODE Biopies;  Surgeon: Autumn Messing III, MD;  Location: [facility];  Service: General;  Laterality: Right;  . CESAREAN SECTION     2008  . KELOID EXCISION Left    ear  . PORTACATH PLACEMENT Left 01/26/2014   Procedure: INSERTION PORT-A-CATH;  Surgeon: Autumn Messing III, MD;  Location: [facility];  Service: General;  Laterality: Left;  . RE-EXCISION OF BREAST CANCER,SUPERIOR MARGINS Right 09/15/2014   Procedure: RE-EXCISION OF RIGHT BREAST CANCER,SUPERIOR AND INFERIOR MARGINS;  Surgeon: Autumn Messing III, MD;  Location: [facility] [place];  Service: General;  Laterality: Right;    Family History  Problem Relation Age of Onset  . Heart attack Father 36  . Lupus Maternal Aunt   . Prostate cancer Maternal Grandfather 72  . Dementia Paternal Grandmother   . Leukemia Cousin 38       paternal first cousin    Social History   Socioeconomic History  . Marital status: Single    Spouse name: Not on file  . Number of children: Not on file  . Years of education: Not on file  . Highest education level: Not on file    Occupational History  . Not on file  Social Needs  . Financial resource strain: Not on file  . Food insecurity:    Worry: Not on file    Inability: Not on file  . Transportation needs:    Medical: Not on file    Non-medical: Not on file  Tobacco Use  . Smoking status: Never Smoker  . Smokeless tobacco: Never Used  Substance and Sexual Activity  . Alcohol use: Yes    Comment: Rarely- maybe  every 6 months  . Drug use: No  . Sexual activity: Never  Lifestyle  . Physical activity:    Days per week: Not on file    Minutes per session: Not on file  . Stress: Not on file  Relationships  . Social connections:    Talks on phone: Not on file    Gets together: Not on file    Attends religious service: Not on file    Active member of club or organization:  Not on file    Attends meetings of clubs or organizations: Not on file    Relationship status: Not on file  . Intimate partner violence:    Fear of current or ex partner: Not on file    Emotionally abused: Not on file    Physically abused: Not on file    Forced sexual activity: Not on file  Other Topics Concern  . Not on file  Social History Narrative  . Not on file    Past Medical History, Surgical history, Social history, and Family history were reviewed and updated as appropriate.   Please see review of systems for further details on the patient's review from today.   Review of Systems:  Review of Systems  Constitutional: Negative for appetite change, chills, diaphoresis and fever.  HENT: Negative for trouble swallowing.   Respiratory: Negative for chest tightness and shortness of breath.   Cardiovascular: Negative for chest pain, palpitations and leg swelling.  Gastrointestinal: Positive for abdominal pain (Epigastric pain and GERD-like symptoms which have improved with her use of Prilosec.). Negative for abdominal distention, blood in stool, constipation, diarrhea, nausea and vomiting.    Objective:   Physical  Exam:  BP 116/84 (BP Location: Left Arm, Patient Position: Sitting, Cuff Size: Normal)   Pulse (!) 131   Temp 98.4 F (36.9 C) (Oral)   Resp 20   Wt 149 lb 6.4 oz (67.8 kg)   SpO2 100%   BMI 23.40 kg/m  ECOG: 1  Physical Exam  Constitutional: No distress.  HENT:  Head: Normocephalic and atraumatic.  Mouth/Throat: No oropharyngeal exudate.  Neck: Normal range of motion. Neck supple.  Cardiovascular: Regular rhythm, S1 normal, S2 normal and normal heart sounds. Tachycardia present. Exam reveals no friction rub.  No murmur heard. Pulmonary/Chest: Effort normal and breath sounds normal. No respiratory distress. She has no wheezes. She has no rales.  Abdominal: Soft. Bowel sounds are normal. She exhibits no distension (Abdomen is protuberant) and no mass. There is no tenderness. There is no rebound and no guarding.  Lymphadenopathy:    She has no cervical adenopathy.  Neurological: She is alert.  Skin: Skin is warm and dry. No rash noted. She is not diaphoretic. No erythema.  Psychiatric: She has a normal mood and affect. Her behavior is normal. Judgment and thought content normal.    Lab Review:     Component Value Date/Time   NA 138 10/22/2017 1310   NA 139 04/09/2017 0950   K 4.6 10/22/2017 1310   K 3.3 (L) 04/09/2017 0950   CL 103 10/22/2017 1310   CO2 23 10/22/2017 1310   CO2 25 04/09/2017 0950   GLUCOSE 128 (H) 10/22/2017 1310   GLUCOSE 213 (H) 04/09/2017 0950   BUN 18 10/22/2017 1310   BUN 14.9 04/09/2017 0950   CREATININE 0.62 10/22/2017 1310   CREATININE 0.9 04/09/2017 0950   CALCIUM 10.5 (H) 10/22/2017 1310   CALCIUM 9.0 04/09/2017 0950   PROT 7.9 10/22/2017 1310   PROT 7.1 04/09/2017 0950   ALBUMIN 3.0 (L) 10/22/2017 1310   ALBUMIN 3.6 04/09/2017 0950   AST 48 (H) 10/22/2017 1310   AST 11 04/09/2017 0950   ALT 64 (H) 10/22/2017 1310   ALT 12 04/09/2017 0950   ALKPHOS 318 (H) 10/22/2017 1310   ALKPHOS 63 04/09/2017 0950   BILITOT 0.5 10/22/2017 1310    BILITOT 0.37 04/09/2017 0950   GFRNONAA >60 10/22/2017 1310   GFRAA >60 10/22/2017 1310  Component Value Date/Time   WBC 7.0 10/22/2017 1310   WBC 9.1 10/18/2017 0449   RBC 4.37 10/22/2017 1310   HGB 11.3 (L) 10/22/2017 1310   HGB 9.4 (L) 04/09/2017 0950   HCT 35.6 10/22/2017 1310   HCT 29.1 (L) 04/09/2017 0950   PLT 104 (L) 10/22/2017 1310   PLT 291 04/09/2017 0950   MCV 81.6 10/22/2017 1310   MCV 94.0 04/09/2017 0950   MCH 25.8 10/22/2017 1310   MCHC 31.6 10/22/2017 1310   RDW 21.7 (H) 10/22/2017 1310   RDW 15.4 (H) 04/09/2017 0950   LYMPHSABS 0.3 (L) 10/22/2017 1310   LYMPHSABS 0.4 (L) 04/09/2017 0950   MONOABS 0.3 10/22/2017 1310   MONOABS 0.1 04/09/2017 0950   EOSABS 0.0 10/22/2017 1310   EOSABS 0.0 04/09/2017 0950   BASOSABS 0.0 10/22/2017 1310   BASOSABS 0.0 04/09/2017 0950   -------------------------------  Imaging from last 24 hours (if applicable):  Radiology interpretation: Ct Angio Chest Pe W Or Wo Contrast  Result Date: 10/13/2017 CLINICAL DATA:  Recent diagnosis of pulmonary embolism. History of metastatic breast cancer. Evaluate for acute or chronic PE. EXAM: CT ANGIOGRAPHY CHEST WITH CONTRAST TECHNIQUE: Multidetector CT imaging of the chest was performed using the standard protocol during bolus administration of intravenous contrast. Multiplanar CT image reconstructions and MIPs were obtained to evaluate the vascular anatomy. CONTRAST:  125mL ISOVUE-370 IOPAMIDOL (ISOVUE-370) INJECTION 76% COMPARISON:  Chest CTA - 09/04/2017 FINDINGS: Vascular Findings: There is adequate opacification of the pulmonary arterial system with the main pulmonary artery measuring 277 Hounsfield units. No evidence of acute or chronic pulmonary embolism special attention paid to the right lower lobe segmental and subsegmental pulmonary arteries at location of previously identified residual pulmonary embolism. Normal caliber the main pulmonary artery. Normal heart size.  No  pericardial effusion. Normal caliber of the thoracic aorta. No definite evidence of thoracic aortic dissection or periaortic stranding on this nongated examination. Conventional configuration of the aortic arch. The branch vessels of the aortic arch appear widely patent throughout their imaged course. Left subclavian vein approach port a catheter tip terminates within the superior cavoatrial junction. Review of the MIP images confirms the above findings. ---------------------------------------------------------------------------------- Nonvascular Findings: Mediastinum/Lymph Nodes: Worsening mediastinal and bilateral hilar lymphadenopathy with index prevascular nodal conglomeration measuring approximately 1.3 cm in greatest short axis diameter (image 28, series 4, previously, 0.6 cm, index subcarinal nodal conglomeration now measuring approximately 3.5 x 1.7 cm (image 36), previously, 1.7 x 1.1 cm, index right suprahilar nodal conglomeration measuring 1.5 cm, previously, 0.9 cm and index left infrahilar nodal conglomeration now measuring 1 cm (image 36, series 4), previously, 0.3 cm. Lungs/Pleura: Interval development of a small right and trace left-sided pleural effusions. Worsening masslike consolidative opacities within the anterior subpleural aspects of the right upper and middle lobes (image 56, series 6). Interval development of multiple new bilateral pulmonary nodules with index nodule within the right upper lobe measuring 0.5 cm in greatest short axis diameter (image 39, series 6), index right middle lobe pulmonary nodule measuring 0.7 cm (image 60), index left lower lobe pulmonary nodule measuring 0.5 cm (image 520 and development of multiple punctate (sub 5 mm) pulmonary nodule within the bilateral lung apices. Upper abdomen: Progression of known hepatic metastatic disease with index lesion within the medial segment of the left lobe liver now measuring 3.7 cm in greatest short axis diameter (image 69,  series 4, previously, 2.6 cm and index lesion within the subcapsular aspect of the anterior segment right lobe of liver  measuring 2.6 cm (image 69), previously,2.0 cm. Musculoskeletal: Re demonstrated extensive osseous metastatic disease with multiple mixed lytic and sclerotic nodule seen throughout the thoracic spine and within the sternum. Unchanged approximately 3.6 cm presumed seroma within the upper inner quadrant of the right breast. IMPRESSION: 1. No evidence of acute or chronic pulmonary embolism. 2. Findings compatible with rapid progression of metastatic disease within the chest with development of multiple bilateral pulmonary nodules and worsening mediastinal and hilar adenopathy. 3. Interval development of small right and trace left-sided pleural effusions. 4. Continued progression of known hepatic metastatic disease. 5. Grossly unchanged sequela of advanced osseous metastatic disease, primarily affecting the thoracic spine. Electronically Signed   By: Sandi Mariscal M.D.   On: 10/13/2017 13:15   Nm Hepatobiliary Liver Func  Result Date: 10/12/2017 CLINICAL DATA:  Right upper quadrant abdominal pain. EXAM: NUCLEAR MEDICINE HEPATOBILIARY IMAGING TECHNIQUE: Sequential images of the abdomen were obtained out to 60 minutes following intravenous administration of radiopharmaceutical. RADIOPHARMACEUTICALS:  5.4 mCi Tc-47m  Choletec IV COMPARISON:  CT scan of Sep 02, 2016. FINDINGS: Prompt uptake and biliary excretion of activity by the liver is seen. Gallbladder activity is visualized, consistent with patency of cystic duct. Biliary activity passes into small bowel, consistent with patent common bile duct. IMPRESSION: Normal uptake within the gallbladder.  No evidence of cholecystitis. Electronically Signed   By: Marijo Conception, M.D.   On: 10/12/2017 19:41   Dg Chest Portable 1 View  Result Date: 10/12/2017 CLINICAL DATA:  Nausea, vomiting and diarrhea beginning yesterday. History of breast cancer.  Patient on ciprofloxacin for UTI over the past week. EXAM: PORTABLE CHEST 1 VIEW COMPARISON:  09/04/2017 FINDINGS: Left subclavian Port-A-Cath has tip just below the cavoatrial junction. Lungs are hypoinflated without focal airspace consolidation or effusion. Cardiomediastinal silhouette and remainder of the exam is unchanged. IMPRESSION: Hypoinflation without acute cardiopulmonary disease. Electronically Signed   By: Marin Olp M.D.   On: 10/12/2017 07:13   US Abdomen Limited Ruq  Result Date: 10/12/2017 CLINICAL DATA:  Chest pain with nausea and vomiting 1 week. Patient [redacted] weeks pregnant. Metastatic breast cancer. EXAM: ULTRASOUND ABDOMEN LIMITED RIGHT UPPER QUADRANT COMPARISON:  CT 09/02/2017 FINDINGS: Gallbladder: There is a mild amount of gallbladder sludge present. No definite gallstones. There is mild edematous gallbladder wall thickening measuring 5.9 mm. Negative sonographic Murphy's sign. No adjacent free fluid. Common bile duct: Diameter: 4.3 mm. Liver: Mild heterogeneous parenchymal pattern with 6.1 cm oval hypoechoic mass likely representing patient's known metastatic liver disease. Portal vein is patent on color Doppler imaging with normal direction of blood flow towards the liver. IMPRESSION: Mild gallbladder sludge with mild edematous gallbladder wall thickening. Recommend clinical correlation for acute cholecystitis. Heterogeneous hepatic parenchymal pattern with 6.1 cm hypoechoic mass present. This is likely due to patient's known metastatic breast cancer. Electronically Signed   By: Marin Olp M.D.   On: 10/12/2017 09:55        This case was discussed with Dr. Lindi Adie. He expresses agreement with my management of this patient.

## 2017-10-23 ENCOUNTER — Other Ambulatory Visit: Payer: Self-pay | Admitting: Hematology and Oncology

## 2017-10-23 ENCOUNTER — Ambulatory Visit
Admission: RE | Admit: 2017-10-23 | Discharge: 2017-10-23 | Disposition: A | Discharge status: Home / Self Care | Payer: Medicare Other | Source: Ambulatory Visit | Attending: Radiation Oncology | Admitting: Radiation Oncology

## 2017-10-23 ENCOUNTER — Telehealth: Payer: Self-pay

## 2017-10-23 DIAGNOSIS — C50911 Malignant neoplasm of unspecified site of right female breast: Secondary | ICD-10-CM | POA: Diagnosis not present

## 2017-10-23 DIAGNOSIS — C7951 Secondary malignant neoplasm of bone: Secondary | ICD-10-CM | POA: Diagnosis not present

## 2017-10-23 DIAGNOSIS — Z7901 Long term (current) use of anticoagulants: Secondary | ICD-10-CM | POA: Diagnosis not present

## 2017-10-23 DIAGNOSIS — Z170 Estrogen receptor positive status [ER+]: Secondary | ICD-10-CM | POA: Diagnosis not present

## 2017-10-23 DIAGNOSIS — D638 Anemia in other chronic diseases classified elsewhere: Secondary | ICD-10-CM | POA: Diagnosis not present

## 2017-10-23 DIAGNOSIS — C781 Secondary malignant neoplasm of mediastinum: Secondary | ICD-10-CM | POA: Diagnosis not present

## 2017-10-23 DIAGNOSIS — G893 Neoplasm related pain (acute) (chronic): Secondary | ICD-10-CM | POA: Diagnosis not present

## 2017-10-23 DIAGNOSIS — C7801 Secondary malignant neoplasm of right lung: Secondary | ICD-10-CM | POA: Diagnosis not present

## 2017-10-23 DIAGNOSIS — I10 Essential (primary) hypertension: Secondary | ICD-10-CM | POA: Diagnosis not present

## 2017-10-23 DIAGNOSIS — Z853 Personal history of malignant neoplasm of breast: Secondary | ICD-10-CM | POA: Diagnosis not present

## 2017-10-23 DIAGNOSIS — Z86711 Personal history of pulmonary embolism: Secondary | ICD-10-CM | POA: Diagnosis not present

## 2017-10-23 DIAGNOSIS — C7802 Secondary malignant neoplasm of left lung: Secondary | ICD-10-CM | POA: Diagnosis not present

## 2017-10-23 DIAGNOSIS — Z510 Encounter for antineoplastic radiation therapy: Secondary | ICD-10-CM | POA: Diagnosis not present

## 2017-10-23 DIAGNOSIS — Z7984 Long term (current) use of oral hypoglycemic drugs: Secondary | ICD-10-CM | POA: Diagnosis not present

## 2017-10-23 DIAGNOSIS — R590 Localized enlarged lymph nodes: Secondary | ICD-10-CM | POA: Diagnosis not present

## 2017-10-23 DIAGNOSIS — E43 Unspecified severe protein-calorie malnutrition: Secondary | ICD-10-CM | POA: Diagnosis not present

## 2017-10-23 DIAGNOSIS — C787 Secondary malignant neoplasm of liver and intrahepatic bile duct: Secondary | ICD-10-CM | POA: Diagnosis not present

## 2017-10-23 DIAGNOSIS — E119 Type 2 diabetes mellitus without complications: Secondary | ICD-10-CM | POA: Diagnosis not present

## 2017-10-23 DIAGNOSIS — K219 Gastro-esophageal reflux disease without esophagitis: Secondary | ICD-10-CM | POA: Diagnosis not present

## 2017-10-23 NOTE — Telephone Encounter (Signed)
Called and spoke with patient concerning a change in treatment timeon 7/6 @1pm . Patient responded with a (okay). 7/2 r/s due to template change.

## 2017-10-25 ENCOUNTER — Inpatient Hospital Stay: Payer: Medicare Other

## 2017-10-25 ENCOUNTER — Encounter: Payer: Self-pay | Admitting: Radiation Oncology

## 2017-10-25 ENCOUNTER — Ambulatory Visit
Admission: RE | Admit: 2017-10-25 | Discharge: 2017-10-25 | Disposition: A | Discharge status: Home / Self Care | Payer: Medicare Other | Source: Ambulatory Visit | Attending: Radiation Oncology | Admitting: Radiation Oncology

## 2017-10-25 DIAGNOSIS — D638 Anemia in other chronic diseases classified elsewhere: Secondary | ICD-10-CM | POA: Diagnosis not present

## 2017-10-25 DIAGNOSIS — Z86711 Personal history of pulmonary embolism: Secondary | ICD-10-CM | POA: Diagnosis not present

## 2017-10-25 DIAGNOSIS — Z7901 Long term (current) use of anticoagulants: Secondary | ICD-10-CM | POA: Diagnosis not present

## 2017-10-25 DIAGNOSIS — K219 Gastro-esophageal reflux disease without esophagitis: Secondary | ICD-10-CM | POA: Diagnosis not present

## 2017-10-25 DIAGNOSIS — C50911 Malignant neoplasm of unspecified site of right female breast: Secondary | ICD-10-CM | POA: Diagnosis not present

## 2017-10-25 DIAGNOSIS — C50919 Malignant neoplasm of unspecified site of unspecified female breast: Secondary | ICD-10-CM

## 2017-10-25 DIAGNOSIS — C787 Secondary malignant neoplasm of liver and intrahepatic bile duct: Secondary | ICD-10-CM | POA: Diagnosis not present

## 2017-10-25 DIAGNOSIS — G893 Neoplasm related pain (acute) (chronic): Secondary | ICD-10-CM | POA: Diagnosis not present

## 2017-10-25 DIAGNOSIS — R590 Localized enlarged lymph nodes: Secondary | ICD-10-CM | POA: Diagnosis not present

## 2017-10-25 DIAGNOSIS — Z510 Encounter for antineoplastic radiation therapy: Secondary | ICD-10-CM | POA: Diagnosis not present

## 2017-10-25 DIAGNOSIS — Z7984 Long term (current) use of oral hypoglycemic drugs: Secondary | ICD-10-CM | POA: Diagnosis not present

## 2017-10-25 DIAGNOSIS — E43 Unspecified severe protein-calorie malnutrition: Secondary | ICD-10-CM | POA: Diagnosis not present

## 2017-10-25 DIAGNOSIS — C7801 Secondary malignant neoplasm of right lung: Secondary | ICD-10-CM | POA: Diagnosis not present

## 2017-10-25 DIAGNOSIS — C781 Secondary malignant neoplasm of mediastinum: Secondary | ICD-10-CM | POA: Diagnosis not present

## 2017-10-25 DIAGNOSIS — Z5111 Encounter for antineoplastic chemotherapy: Secondary | ICD-10-CM | POA: Diagnosis not present

## 2017-10-25 DIAGNOSIS — E119 Type 2 diabetes mellitus without complications: Secondary | ICD-10-CM | POA: Diagnosis not present

## 2017-10-25 DIAGNOSIS — C50211 Malignant neoplasm of upper-inner quadrant of right female breast: Secondary | ICD-10-CM | POA: Diagnosis not present

## 2017-10-25 DIAGNOSIS — C7802 Secondary malignant neoplasm of left lung: Secondary | ICD-10-CM | POA: Diagnosis not present

## 2017-10-25 DIAGNOSIS — Z853 Personal history of malignant neoplasm of breast: Secondary | ICD-10-CM | POA: Diagnosis not present

## 2017-10-25 DIAGNOSIS — I10 Essential (primary) hypertension: Secondary | ICD-10-CM | POA: Diagnosis not present

## 2017-10-25 DIAGNOSIS — C7951 Secondary malignant neoplasm of bone: Secondary | ICD-10-CM | POA: Diagnosis not present

## 2017-10-25 DIAGNOSIS — Z170 Estrogen receptor positive status [ER+]: Secondary | ICD-10-CM | POA: Diagnosis not present

## 2017-10-25 LAB — CBC WITH DIFFERENTIAL (CANCER CENTER ONLY)
BASOS ABS: 0 10*3/uL (ref 0.0–0.1)
BASOS PCT: 0 %
EOS ABS: 0 10*3/uL (ref 0.0–0.5)
Eosinophils Relative: 0 %
HEMATOCRIT: 35.7 % (ref 34.8–46.6)
HEMOGLOBIN: 11.3 g/dL — AB (ref 11.6–15.9)
Lymphocytes Relative: 7 %
Lymphs Abs: 0.5 10*3/uL — ABNORMAL LOW (ref 0.9–3.3)
MCH: 26.2 pg (ref 25.1–34.0)
MCHC: 31.7 g/dL (ref 31.5–36.0)
MCV: 82.8 fL (ref 79.5–101.0)
Monocytes Absolute: 0.2 10*3/uL (ref 0.1–0.9)
Monocytes Relative: 3 %
NEUTROS ABS: 5.8 10*3/uL (ref 1.5–6.5)
NEUTROS PCT: 90 %
Platelet Count: 153 10*3/uL (ref 145–400)
RBC: 4.31 MIL/uL (ref 3.70–5.45)
RDW: 20.2 % — AB (ref 11.2–14.5)
WBC Count: 6.5 10*3/uL (ref 3.9–10.3)

## 2017-10-25 LAB — COMPREHENSIVE METABOLIC PANEL
ALBUMIN: 3.3 g/dL — AB (ref 3.5–5.0)
ALK PHOS: 274 U/L — AB (ref 38–126)
ALT: 40 U/L (ref 0–44)
AST: 41 U/L (ref 15–41)
Anion gap: 16 — ABNORMAL HIGH (ref 5–15)
BUN: 14 mg/dL (ref 6–20)
CO2: 19 mmol/L — AB (ref 22–32)
Calcium: 9.9 mg/dL (ref 8.9–10.3)
Chloride: 102 mmol/L (ref 98–111)
Creatinine, Ser: 0.66 mg/dL (ref 0.44–1.00)
GFR calc Af Amer: >60 mL/min (ref >60)
GFR calc non Af Amer: >60 mL/min (ref >60)
GLUCOSE: 167 mg/dL — AB (ref 70–99)
POTASSIUM: 4.1 mmol/L (ref 3.5–5.1)
SODIUM: 137 mmol/L (ref 135–145)
TOTAL PROTEIN: 8.2 g/dL — AB (ref 6.5–8.1)
Total Bilirubin: 0.6 mg/dL (ref 0.3–1.2)

## 2017-10-26 ENCOUNTER — Ambulatory Visit: Payer: Medicare Other

## 2017-10-26 ENCOUNTER — Inpatient Hospital Stay: Payer: Medicare Other

## 2017-10-26 VITALS — BP 117/95 | HR 112 | Temp 98.2°F | Resp 20

## 2017-10-26 DIAGNOSIS — C50211 Malignant neoplasm of upper-inner quadrant of right female breast: Secondary | ICD-10-CM

## 2017-10-26 DIAGNOSIS — C50919 Malignant neoplasm of unspecified site of unspecified female breast: Secondary | ICD-10-CM

## 2017-10-26 DIAGNOSIS — Z95828 Presence of other vascular implants and grafts: Secondary | ICD-10-CM

## 2017-10-26 DIAGNOSIS — Z5111 Encounter for antineoplastic chemotherapy: Secondary | ICD-10-CM | POA: Diagnosis not present

## 2017-10-26 DIAGNOSIS — C50911 Malignant neoplasm of unspecified site of right female breast: Secondary | ICD-10-CM

## 2017-10-26 MED ORDER — SODIUM CHLORIDE 0.9 % IV SOLN
Freq: Once | INTRAVENOUS | Status: AC
  Administered 2017-10-26: 13:00:00 via INTRAVENOUS

## 2017-10-26 MED ORDER — HEPARIN SOD (PORK) LOCK FLUSH 100 UNIT/ML IV SOLN
500.0000 [IU] | Freq: Once | INTRAVENOUS | Status: AC | PRN
  Administered 2017-10-26: 500 [IU]
  Filled 2017-10-26: qty 5

## 2017-10-26 MED ORDER — PALONOSETRON HCL INJECTION 0.25 MG/5ML
0.2500 mg | Freq: Once | INTRAVENOUS | Status: AC
  Administered 2017-10-26: 0.25 mg via INTRAVENOUS

## 2017-10-26 MED ORDER — PALONOSETRON HCL INJECTION 0.25 MG/5ML
INTRAVENOUS | Status: AC
  Filled 2017-10-26: qty 5

## 2017-10-26 MED ORDER — SODIUM CHLORIDE 0.9 % IV SOLN
700.0000 mg | Freq: Once | INTRAVENOUS | Status: AC
  Administered 2017-10-26: 700 mg via INTRAVENOUS
  Filled 2017-10-26: qty 70

## 2017-10-26 MED ORDER — SODIUM CHLORIDE 0.9% FLUSH
10.0000 mL | INTRAVENOUS | Status: DC | PRN
  Administered 2017-10-26: 10 mL
  Filled 2017-10-26: qty 10

## 2017-10-26 MED ORDER — ZOLEDRONIC ACID 4 MG/100ML IV SOLN
4.0000 mg | Freq: Once | INTRAVENOUS | Status: AC
  Administered 2017-10-26: 4 mg via INTRAVENOUS
  Filled 2017-10-26: qty 100

## 2017-10-26 MED ORDER — SODIUM CHLORIDE 0.9 % IV SOLN
Freq: Once | INTRAVENOUS | Status: AC
  Administered 2017-10-26: 14:00:00 via INTRAVENOUS
  Filled 2017-10-26: qty 5

## 2017-10-26 NOTE — Patient Instructions (Addendum)
[facility] Discharge Instructions for Patients Receiving Chemotherapy  Today you received the following chemotherapy agent: Carboplatin & zometa  To help prevent nausea and vomiting after your treatment, we encourage you to take your nausea medication as prescribed. If you develop nausea and vomiting that is not controlled by your nausea medication, call the clinic.   BELOW ARE SYMPTOMS THAT SHOULD BE REPORTED IMMEDIATELY:  *FEVER GREATER THAN 100.5 F  *CHILLS WITH OR WITHOUT FEVER  NAUSEA AND VOMITING THAT IS NOT CONTROLLED WITH YOUR NAUSEA MEDICATION  *UNUSUAL SHORTNESS OF BREATH  *UNUSUAL BRUISING OR BLEEDING  TENDERNESS IN MOUTH AND THROAT WITH OR WITHOUT PRESENCE OF ULCERS  *URINARY PROBLEMS  *BOWEL PROBLEMS  UNUSUAL RASH Items with * indicate a potential emergency and should be followed up as soon as possible.  Feel free to call the clinic should you have any questions or concerns. The clinic phone number is [phone].  Please show the [facility] at check-in to the Emergency Department and triage nurse.    Zoledronic Acid injection (Hypercalcemia, Oncology) What is this medicine? ZOLEDRONIC ACID (ZOE le dron ik AS id) lowers the amount of calcium loss from bone. It is used to treat too much calcium in your blood from cancer. It is also used to prevent complications of cancer that has spread to the bone. This medicine may be used for other purposes; ask your health care provider or pharmacist if you have questions. COMMON BRAND NAME(S): Zometa What should I tell my health care provider before I take this medicine? They need to know if you have any of these conditions: -aspirin-sensitive asthma -cancer, especially if you are receiving medicines used to treat cancer -dental disease or wear dentures -infection -kidney disease -receiving corticosteroids like dexamethasone or prednisone -an unusual or allergic reaction to zoledronic acid,  other medicines, foods, dyes, or preservatives -pregnant or trying to get pregnant -breast-feeding How should I use this medicine? This medicine is for infusion into a vein. It is given by a health care professional in a hospital or clinic setting. Talk to your pediatrician regarding the use of this medicine in children. Special care may be needed. Overdosage: If you think you have taken too much of this medicine contact a poison control center or emergency room at once. NOTE: This medicine is only for you. Do not share this medicine with others. What if I miss a dose? It is important not to miss your dose. Call your doctor or health care professional if you are unable to keep an appointment. What may interact with this medicine? -certain antibiotics given by injection -NSAIDs, medicines for pain and inflammation, like ibuprofen or naproxen -some diuretics like bumetanide, furosemide -teriparatide -thalidomide This list may not describe all possible interactions. Give your health care provider a list of all the medicines, herbs, non-prescription drugs, or dietary supplements you use. Also tell them if you smoke, drink alcohol, or use illegal drugs. Some items may interact with your medicine. What should I watch for while using this medicine? Visit your doctor or health care professional for regular checkups. It may be some time before you see the benefit from this medicine. Do not stop taking your medicine unless your doctor tells you to. Your doctor may order blood tests or other tests to see how you are doing. Women should inform their doctor if they wish to become pregnant or think they might be pregnant. There is a potential for serious side effects to an unborn child.  Talk to your health care professional or pharmacist for more information. You should make sure that you get enough calcium and vitamin D while you are taking this medicine. Discuss the foods you eat and the vitamins you take  with your health care professional. Some people who take this medicine have severe bone, joint, and/or muscle pain. This medicine may also increase your risk for jaw problems or a broken thigh bone. Tell your doctor right away if you have severe pain in your jaw, bones, joints, or muscles. Tell your doctor if you have any pain that does not go away or that gets worse. Tell your dentist and dental surgeon that you are taking this medicine. You should not have major dental surgery while on this medicine. See your dentist to have a dental exam and fix any dental problems before starting this medicine. Take good care of your teeth while on this medicine. Make sure you see your dentist for regular follow-up appointments. What side effects may I notice from receiving this medicine? Side effects that you should report to your doctor or health care professional as soon as possible: -allergic reactions like skin rash, itching or hives, swelling of the face, lips, or tongue -anxiety, confusion, or depression -breathing problems -changes in vision -eye pain -feeling faint or lightheaded, falls -jaw pain, especially after dental work -mouth sores -muscle cramps, stiffness, or weakness -redness, blistering, peeling or loosening of the skin, including inside the mouth -trouble passing urine or change in the amount of urine Side effects that usually do not require medical attention (report to your doctor or health care professional if they continue or are bothersome): -bone, joint, or muscle pain -constipation -diarrhea -fever -hair loss -irritation at site where injected -loss of appetite -nausea, vomiting -stomach upset -trouble sleeping -trouble swallowing -weak or tired This list may not describe all possible side effects. Call your doctor for medical advice about side effects. You may report side effects to FDA at 1-800-FDA-1088. Where should I keep my medicine? This drug is given in a hospital  or clinic and will not be stored at home. NOTE: This sheet is a summary. It may not cover all possible information. If you have questions about this medicine, talk to your doctor, pharmacist, or health care provider.  2018 Elsevier/Gold Standard (2013-09-05 14:19:39)

## 2017-10-28 ENCOUNTER — Telehealth: Payer: Self-pay

## 2017-10-28 NOTE — Telephone Encounter (Signed)
Gerald Stabs, physical therapist from [facility] needing verbal order to have patient do PT at home.  1 time weekly X 1 week, 2 times weekly X 2 weeks, and then 1 time weekly X 2 weeks.  Dr. Lindi Adie gave verbal okay.  Chris notified.  No further needs at this time.

## 2017-10-30 DIAGNOSIS — C7801 Secondary malignant neoplasm of right lung: Secondary | ICD-10-CM | POA: Diagnosis not present

## 2017-10-30 DIAGNOSIS — Z86711 Personal history of pulmonary embolism: Secondary | ICD-10-CM | POA: Diagnosis not present

## 2017-10-30 DIAGNOSIS — C7802 Secondary malignant neoplasm of left lung: Secondary | ICD-10-CM | POA: Diagnosis not present

## 2017-10-30 DIAGNOSIS — I10 Essential (primary) hypertension: Secondary | ICD-10-CM | POA: Diagnosis not present

## 2017-10-30 DIAGNOSIS — R590 Localized enlarged lymph nodes: Secondary | ICD-10-CM | POA: Diagnosis not present

## 2017-10-30 DIAGNOSIS — K219 Gastro-esophageal reflux disease without esophagitis: Secondary | ICD-10-CM | POA: Diagnosis not present

## 2017-10-30 DIAGNOSIS — E119 Type 2 diabetes mellitus without complications: Secondary | ICD-10-CM | POA: Diagnosis not present

## 2017-10-30 DIAGNOSIS — C781 Secondary malignant neoplasm of mediastinum: Secondary | ICD-10-CM | POA: Diagnosis not present

## 2017-10-30 DIAGNOSIS — Z7984 Long term (current) use of oral hypoglycemic drugs: Secondary | ICD-10-CM | POA: Diagnosis not present

## 2017-10-30 DIAGNOSIS — E43 Unspecified severe protein-calorie malnutrition: Secondary | ICD-10-CM | POA: Diagnosis not present

## 2017-10-30 DIAGNOSIS — G893 Neoplasm related pain (acute) (chronic): Secondary | ICD-10-CM | POA: Diagnosis not present

## 2017-10-30 DIAGNOSIS — C787 Secondary malignant neoplasm of liver and intrahepatic bile duct: Secondary | ICD-10-CM | POA: Diagnosis not present

## 2017-10-30 DIAGNOSIS — Z853 Personal history of malignant neoplasm of breast: Secondary | ICD-10-CM | POA: Diagnosis not present

## 2017-10-30 DIAGNOSIS — D638 Anemia in other chronic diseases classified elsewhere: Secondary | ICD-10-CM | POA: Diagnosis not present

## 2017-10-30 DIAGNOSIS — Z7901 Long term (current) use of anticoagulants: Secondary | ICD-10-CM | POA: Diagnosis not present

## 2017-10-30 DIAGNOSIS — C7951 Secondary malignant neoplasm of bone: Secondary | ICD-10-CM | POA: Diagnosis not present

## 2017-10-31 ENCOUNTER — Encounter (HOSPITAL_COMMUNITY): Payer: Self-pay | Admitting: Hematology and Oncology

## 2017-10-31 ENCOUNTER — Telehealth: Payer: Self-pay | Admitting: *Deleted

## 2017-10-31 NOTE — Telephone Encounter (Signed)
This RN received communication from pharmacy need to reschedule carbo ( q 21 days )- from 7/16 to following week due to pt was treated on 10/26/2017.  This RN verified with MD above and sent a scheduling message to reschedule appointments accordingly.

## 2017-11-01 DIAGNOSIS — R590 Localized enlarged lymph nodes: Secondary | ICD-10-CM | POA: Diagnosis not present

## 2017-11-01 DIAGNOSIS — C7801 Secondary malignant neoplasm of right lung: Secondary | ICD-10-CM | POA: Diagnosis not present

## 2017-11-01 DIAGNOSIS — Z86711 Personal history of pulmonary embolism: Secondary | ICD-10-CM | POA: Diagnosis not present

## 2017-11-01 DIAGNOSIS — C7802 Secondary malignant neoplasm of left lung: Secondary | ICD-10-CM | POA: Diagnosis not present

## 2017-11-01 DIAGNOSIS — E119 Type 2 diabetes mellitus without complications: Secondary | ICD-10-CM | POA: Diagnosis not present

## 2017-11-01 DIAGNOSIS — G893 Neoplasm related pain (acute) (chronic): Secondary | ICD-10-CM | POA: Diagnosis not present

## 2017-11-01 DIAGNOSIS — C50919 Malignant neoplasm of unspecified site of unspecified female breast: Secondary | ICD-10-CM | POA: Diagnosis not present

## 2017-11-01 DIAGNOSIS — I10 Essential (primary) hypertension: Secondary | ICD-10-CM | POA: Diagnosis not present

## 2017-11-01 DIAGNOSIS — C787 Secondary malignant neoplasm of liver and intrahepatic bile duct: Secondary | ICD-10-CM | POA: Diagnosis not present

## 2017-11-01 DIAGNOSIS — E43 Unspecified severe protein-calorie malnutrition: Secondary | ICD-10-CM | POA: Diagnosis not present

## 2017-11-01 DIAGNOSIS — C781 Secondary malignant neoplasm of mediastinum: Secondary | ICD-10-CM | POA: Diagnosis not present

## 2017-11-01 DIAGNOSIS — C7951 Secondary malignant neoplasm of bone: Secondary | ICD-10-CM | POA: Diagnosis not present

## 2017-11-01 DIAGNOSIS — D638 Anemia in other chronic diseases classified elsewhere: Secondary | ICD-10-CM | POA: Diagnosis not present

## 2017-11-01 DIAGNOSIS — Z853 Personal history of malignant neoplasm of breast: Secondary | ICD-10-CM | POA: Diagnosis not present

## 2017-11-01 DIAGNOSIS — Z7984 Long term (current) use of oral hypoglycemic drugs: Secondary | ICD-10-CM | POA: Diagnosis not present

## 2017-11-01 DIAGNOSIS — Z7901 Long term (current) use of anticoagulants: Secondary | ICD-10-CM | POA: Diagnosis not present

## 2017-11-01 DIAGNOSIS — K219 Gastro-esophageal reflux disease without esophagitis: Secondary | ICD-10-CM | POA: Diagnosis not present

## 2017-11-04 ENCOUNTER — Telehealth: Payer: Self-pay | Admitting: Hematology and Oncology

## 2017-11-04 NOTE — Telephone Encounter (Signed)
Left message for patient regarding upcoming appt updates per 7/11 sch message

## 2017-11-05 ENCOUNTER — Inpatient Hospital Stay: Payer: Medicare Other

## 2017-11-05 ENCOUNTER — Inpatient Hospital Stay: Payer: Medicare Other | Admitting: Hematology and Oncology

## 2017-11-07 DIAGNOSIS — Z7901 Long term (current) use of anticoagulants: Secondary | ICD-10-CM | POA: Diagnosis not present

## 2017-11-07 DIAGNOSIS — G9340 Encephalopathy, unspecified: Secondary | ICD-10-CM | POA: Diagnosis not present

## 2017-11-07 DIAGNOSIS — D696 Thrombocytopenia, unspecified: Secondary | ICD-10-CM | POA: Diagnosis not present

## 2017-11-07 DIAGNOSIS — R1310 Dysphagia, unspecified: Secondary | ICD-10-CM | POA: Diagnosis not present

## 2017-11-07 DIAGNOSIS — I361 Nonrheumatic tricuspid (valve) insufficiency: Secondary | ICD-10-CM | POA: Diagnosis not present

## 2017-11-07 DIAGNOSIS — R109 Unspecified abdominal pain: Secondary | ICD-10-CM | POA: Diagnosis not present

## 2017-11-07 DIAGNOSIS — G893 Neoplasm related pain (acute) (chronic): Secondary | ICD-10-CM | POA: Diagnosis not present

## 2017-11-07 DIAGNOSIS — I2782 Chronic pulmonary embolism: Secondary | ICD-10-CM | POA: Diagnosis not present

## 2017-11-07 DIAGNOSIS — R000 Tachycardia, unspecified: Secondary | ICD-10-CM | POA: Diagnosis not present

## 2017-11-07 DIAGNOSIS — C7800 Secondary malignant neoplasm of unspecified lung: Secondary | ICD-10-CM | POA: Diagnosis not present

## 2017-11-07 DIAGNOSIS — C50919 Malignant neoplasm of unspecified site of unspecified female breast: Secondary | ICD-10-CM | POA: Diagnosis not present

## 2017-11-07 DIAGNOSIS — C787 Secondary malignant neoplasm of liver and intrahepatic bile duct: Secondary | ICD-10-CM | POA: Diagnosis not present

## 2017-11-07 DIAGNOSIS — R52 Pain, unspecified: Secondary | ICD-10-CM | POA: Diagnosis not present

## 2017-11-07 DIAGNOSIS — D62 Acute posthemorrhagic anemia: Secondary | ICD-10-CM | POA: Diagnosis not present

## 2017-11-07 DIAGNOSIS — R509 Fever, unspecified: Secondary | ICD-10-CM | POA: Diagnosis not present

## 2017-11-07 DIAGNOSIS — I2699 Other pulmonary embolism without acute cor pulmonale: Secondary | ICD-10-CM | POA: Diagnosis not present

## 2017-11-07 DIAGNOSIS — C772 Secondary and unspecified malignant neoplasm of intra-abdominal lymph nodes: Secondary | ICD-10-CM | POA: Diagnosis not present

## 2017-11-07 DIAGNOSIS — R9431 Abnormal electrocardiogram [ECG] [EKG]: Secondary | ICD-10-CM | POA: Diagnosis not present

## 2017-11-07 DIAGNOSIS — R0682 Tachypnea, not elsewhere classified: Secondary | ICD-10-CM | POA: Diagnosis not present

## 2017-11-07 DIAGNOSIS — I517 Cardiomegaly: Secondary | ICD-10-CM | POA: Diagnosis not present

## 2017-11-07 DIAGNOSIS — Z743 Need for continuous supervision: Secondary | ICD-10-CM | POA: Diagnosis not present

## 2017-11-07 DIAGNOSIS — C7952 Secondary malignant neoplasm of bone marrow: Secondary | ICD-10-CM | POA: Diagnosis not present

## 2017-11-07 DIAGNOSIS — Z66 Do not resuscitate: Secondary | ICD-10-CM | POA: Diagnosis not present

## 2017-11-07 DIAGNOSIS — Z923 Personal history of irradiation: Secondary | ICD-10-CM | POA: Diagnosis not present

## 2017-11-07 DIAGNOSIS — R918 Other nonspecific abnormal finding of lung field: Secondary | ICD-10-CM | POA: Diagnosis not present

## 2017-11-07 DIAGNOSIS — I358 Other nonrheumatic aortic valve disorders: Secondary | ICD-10-CM | POA: Diagnosis not present

## 2017-11-07 DIAGNOSIS — D61818 Other pancytopenia: Secondary | ICD-10-CM | POA: Diagnosis not present

## 2017-11-07 DIAGNOSIS — R079 Chest pain, unspecified: Secondary | ICD-10-CM | POA: Diagnosis not present

## 2017-11-07 DIAGNOSIS — I471 Supraventricular tachycardia: Secondary | ICD-10-CM | POA: Diagnosis not present

## 2017-11-07 DIAGNOSIS — K219 Gastro-esophageal reflux disease without esophagitis: Secondary | ICD-10-CM | POA: Diagnosis not present

## 2017-11-07 DIAGNOSIS — E872 Acidosis: Secondary | ICD-10-CM | POA: Diagnosis not present

## 2017-11-07 DIAGNOSIS — C50911 Malignant neoplasm of unspecified site of right female breast: Secondary | ICD-10-CM | POA: Diagnosis not present

## 2017-11-07 DIAGNOSIS — K5900 Constipation, unspecified: Secondary | ICD-10-CM | POA: Diagnosis not present

## 2017-11-07 DIAGNOSIS — T451X5A Adverse effect of antineoplastic and immunosuppressive drugs, initial encounter: Secondary | ICD-10-CM | POA: Diagnosis not present

## 2017-11-07 DIAGNOSIS — R072 Precordial pain: Secondary | ICD-10-CM | POA: Diagnosis not present

## 2017-11-07 DIAGNOSIS — C7951 Secondary malignant neoplasm of bone: Secondary | ICD-10-CM | POA: Diagnosis not present

## 2017-11-07 DIAGNOSIS — I445 Left posterior fascicular block: Secondary | ICD-10-CM | POA: Diagnosis not present

## 2017-11-07 DIAGNOSIS — C7981 Secondary malignant neoplasm of breast: Secondary | ICD-10-CM | POA: Diagnosis not present

## 2017-11-07 DIAGNOSIS — R2689 Other abnormalities of gait and mobility: Secondary | ICD-10-CM | POA: Diagnosis not present

## 2017-11-07 DIAGNOSIS — I10 Essential (primary) hypertension: Secondary | ICD-10-CM | POA: Diagnosis not present

## 2017-11-07 DIAGNOSIS — M791 Myalgia, unspecified site: Secondary | ICD-10-CM | POA: Diagnosis not present

## 2017-11-07 DIAGNOSIS — Z515 Encounter for palliative care: Secondary | ICD-10-CM | POA: Diagnosis not present

## 2017-11-07 DIAGNOSIS — E1142 Type 2 diabetes mellitus with diabetic polyneuropathy: Secondary | ICD-10-CM | POA: Diagnosis not present

## 2017-11-07 DIAGNOSIS — R410 Disorientation, unspecified: Secondary | ICD-10-CM | POA: Diagnosis not present

## 2017-11-07 DIAGNOSIS — R0789 Other chest pain: Secondary | ICD-10-CM | POA: Diagnosis not present

## 2017-11-07 DIAGNOSIS — Z9221 Personal history of antineoplastic chemotherapy: Secondary | ICD-10-CM | POA: Diagnosis not present

## 2017-11-08 NOTE — Progress Notes (Signed)
  Radiation Oncology         [phone] ________________________________  Name: Debbie Martinez MRN: [id number]  Date: 10/25/2017  DOB: 03-13-1976  End of Treatment Note  Diagnosis:   Widespread metastatic breast cancer with significant pain in the right upper quadrant of the abdomen (liver metastases).     Indication for treatment:  Palliative       Radiation treatment dates:   10/16/2017 to 10/25/2017  Site/dose:   The whole liver was treated to 21 Gy in 7 fractions of 3 Gy.  Beams/energy:  3D // 15X  Narrative: The patient tolerated radiation treatment relatively well.   Reports fatigue and heartburn. She also reports continued weakness and shortness of breath.  Plan: The patient has completed radiation treatment. Advised to take omeprazole for heartburn. Patient referred to symptom management clinic. The patient will return to radiation oncology clinic for routine followup in one month. I advised them to call or return sooner if they have any questions or concerns related to their recovery or treatment. -----------------------------------  [name], MD   This document serves as a record of services personally performed by [name], MD. It was created on her behalf by Arlyce Harman, a trained medical scribe. The creation of this record is based on the scribe's personal observations and the provider's statements to them. This document has been checked and approved by the attending provider.

## 2017-11-11 ENCOUNTER — Telehealth: Payer: Self-pay | Admitting: Hematology and Oncology

## 2017-11-11 ENCOUNTER — Telehealth: Payer: Self-pay

## 2017-11-11 NOTE — Telephone Encounter (Signed)
Faxed medical records to Peggye Ley with [facility] @ [phone]. Release ID# [id number]

## 2017-11-11 NOTE — Telephone Encounter (Signed)
Received call from [facility] notifying Dr.Gudena of pt's inpatient status. Pt was having severe general malaise with fevers and has been admitted since 7/18. Pt will be discharge to home tomorrow.   Spoke with general medicine at [facility] and gave them update on patient current chemo infusion schedules and history. Pt recently moved in with her cousin and is no longer living in [place] area. She recently had broken up with her long time boyfriend of 10 yrs. She is thinking of possibly establishing care in [facility] area instead. Consults to oncology and palliative care were done during her inpatient stay.  Pt coming back for next cycle of carbo infusion on 11/15/17.

## 2017-11-12 ENCOUNTER — Other Ambulatory Visit: Payer: Self-pay | Admitting: Radiation Oncology

## 2017-11-12 DIAGNOSIS — C7951 Secondary malignant neoplasm of bone: Secondary | ICD-10-CM

## 2017-11-14 DIAGNOSIS — R9431 Abnormal electrocardiogram [ECG] [EKG]: Secondary | ICD-10-CM | POA: Diagnosis not present

## 2017-11-14 DIAGNOSIS — R000 Tachycardia, unspecified: Secondary | ICD-10-CM | POA: Diagnosis not present

## 2017-11-15 ENCOUNTER — Telehealth: Payer: Self-pay | Admitting: *Deleted

## 2017-11-15 ENCOUNTER — Inpatient Hospital Stay: Payer: Medicare Other

## 2017-11-15 ENCOUNTER — Telehealth: Payer: Self-pay | Admitting: Hematology and Oncology

## 2017-11-15 ENCOUNTER — Inpatient Hospital Stay: Payer: Medicare Other | Admitting: Adult Health

## 2017-11-15 NOTE — Telephone Encounter (Signed)
Patient called to cancel appts due to being in the hospital.

## 2017-11-15 NOTE — Telephone Encounter (Signed)
Called pt to check in and assess needs. Noted pt admitted to [facility]. Contact information provided.

## 2017-11-22 ENCOUNTER — Telehealth: Payer: Self-pay | Admitting: *Deleted

## 2017-11-22 NOTE — Telephone Encounter (Signed)
Called pt to check in and assess needs and to see if she has decided to transfer care to [facility]. No answer, unable to leave vm d/t inbox full. Will call again.

## 2017-11-25 ENCOUNTER — Encounter: Payer: Self-pay | Admitting: Radiation Oncology

## 2017-11-25 NOTE — Progress Notes (Signed)
error 

## 2017-11-26 ENCOUNTER — Ambulatory Visit: Payer: Medicare Other

## 2017-11-26 ENCOUNTER — Other Ambulatory Visit: Payer: Medicare Other

## 2017-11-26 ENCOUNTER — Ambulatory Visit: Payer: Medicare Other | Admitting: Hematology and Oncology

## 2017-11-28 ENCOUNTER — Telehealth: Payer: Self-pay | Admitting: *Deleted

## 2017-11-28 NOTE — Telephone Encounter (Signed)
Called [facility] to receive update on pt status and to verify pt is receiving care at their facility. Was notified pt was d/c home with hospice care.  Dr Lindi Adie notified, future appts cx

## 2017-11-28 NOTE — Telephone Encounter (Signed)
Called pt to assess needs. No answer and unable to leave vm d/t mailbox is full.

## 2017-12-06 ENCOUNTER — Other Ambulatory Visit: Payer: Medicare Other

## 2017-12-06 ENCOUNTER — Ambulatory Visit
Admission: RE | Admit: 2017-12-06 | Discharge: 2017-12-06 | Disposition: A | Discharge status: Home / Self Care | Payer: Medicare Other | Source: Ambulatory Visit | Attending: Radiation Oncology | Admitting: Radiation Oncology

## 2017-12-06 ENCOUNTER — Ambulatory Visit: Payer: Medicare Other

## 2017-12-06 ENCOUNTER — Ambulatory Visit: Payer: Medicare Other | Admitting: Adult Health

## 2017-12-11 DIAGNOSIS — R000 Tachycardia, unspecified: Secondary | ICD-10-CM | POA: Diagnosis not present

## 2017-12-11 DIAGNOSIS — R404 Transient alteration of awareness: Secondary | ICD-10-CM | POA: Diagnosis not present

## 2017-12-11 DIAGNOSIS — R0689 Other abnormalities of breathing: Secondary | ICD-10-CM | POA: Diagnosis not present

## 2017-12-11 DIAGNOSIS — Z7401 Bed confinement status: Secondary | ICD-10-CM | POA: Diagnosis not present

## 2017-12-17 ENCOUNTER — Ambulatory Visit: Payer: Medicare Other | Admitting: Hematology and Oncology

## 2017-12-17 ENCOUNTER — Other Ambulatory Visit: Payer: Medicare Other

## 2017-12-17 ENCOUNTER — Ambulatory Visit: Payer: Medicare Other

## 2017-12-22 DEATH — deceased

## 2017-12-27 ENCOUNTER — Ambulatory Visit: Payer: Medicare Other | Admitting: Hematology and Oncology

## 2017-12-27 ENCOUNTER — Other Ambulatory Visit: Payer: Medicare Other

## 2017-12-27 ENCOUNTER — Ambulatory Visit: Payer: Medicare Other

## 2018-01-07 ENCOUNTER — Other Ambulatory Visit: Payer: Medicare Other

## 2018-01-07 ENCOUNTER — Ambulatory Visit: Payer: Medicare Other | Admitting: Hematology and Oncology

## 2018-01-07 ENCOUNTER — Ambulatory Visit: Payer: Medicare Other

## 2019-05-06 IMAGING — CT CT ANGIO CHEST
2 of 6 series · 17 of 46 positions shown · IV contrast (iopamidol)
Comparison: Chest CT September 02, 2017 and chest radiograph September 04, 2017

CLINICAL DATA: Chest pain.  History of breast carcinoma

EXAM:
CT ANGIOGRAPHY CHEST WITH CONTRAST
TECHNIQUE: Multidetector CT imaging of the chest was performed using the
standard protocol during bolus administration of intravenous
contrast. Multiplanar CT image reconstructions and MIPs were
obtained to evaluate the vascular anatomy.
CONTRAST:  68mL 6PHTNM-J7A IOPAMIDOL (6PHTNM-J7A) INJECTION 76%

[Series 6: thins · axial · 0.62mm/px · z∈[-308,-76]mm · 14 of 255 slices shown]
[im 12/255  lung]
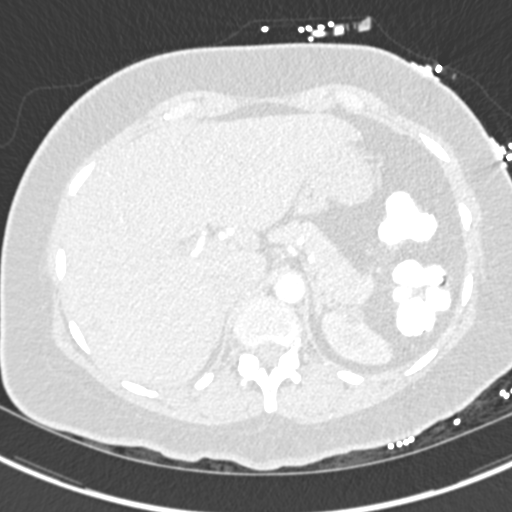
[im 34/255  soft-tissue]
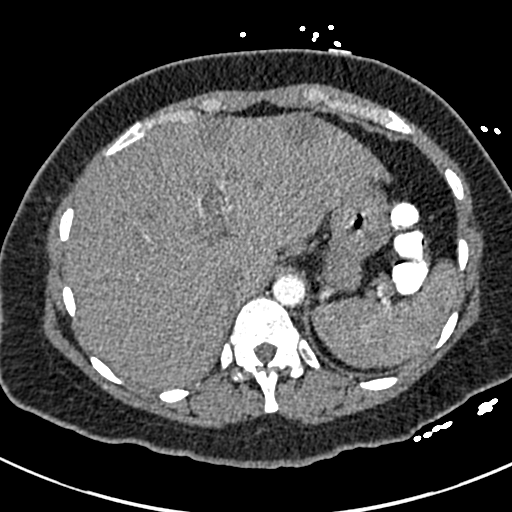
[im 45/255  lung]
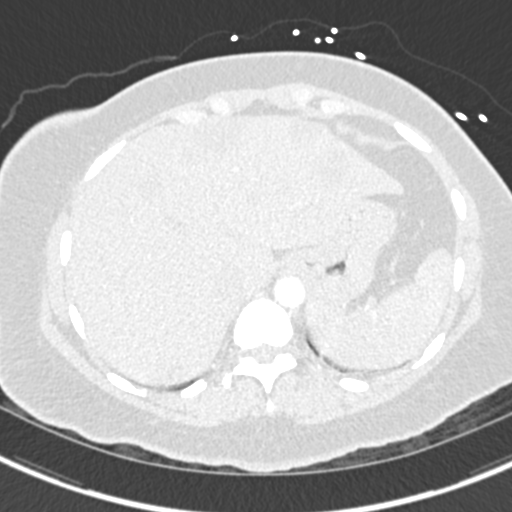
[im 67/255  soft-tissue]
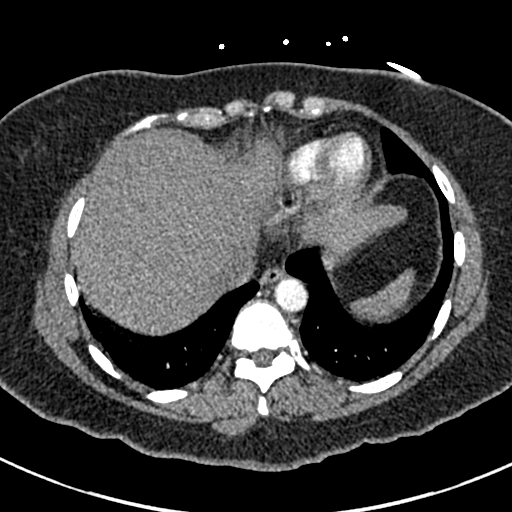
[im 89/255  lung]
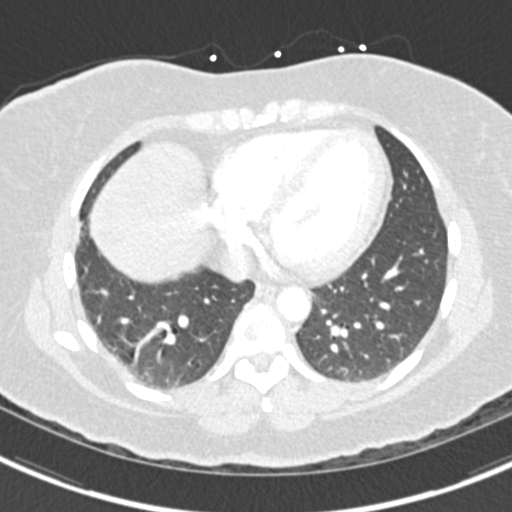
[im 100/255  soft-tissue]
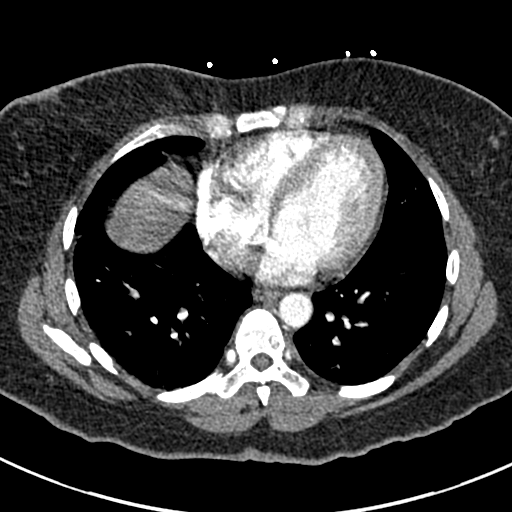
[im 122/255  lung]
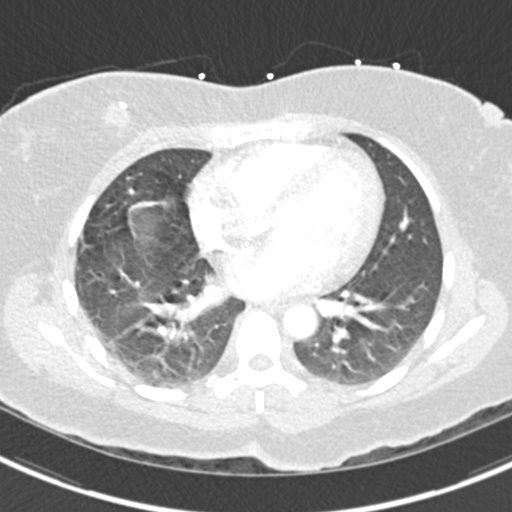
[im 133/255  soft-tissue]
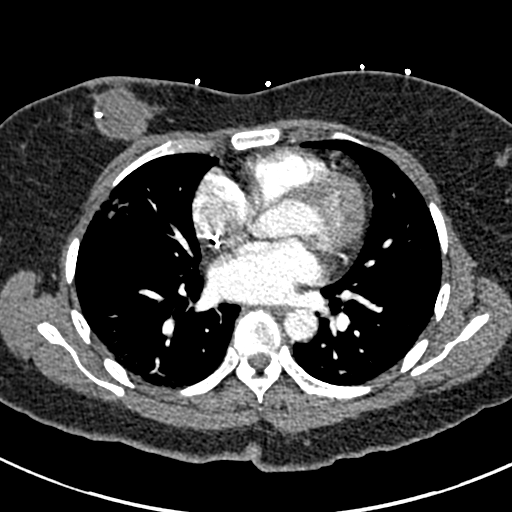
[im 155/255  lung]
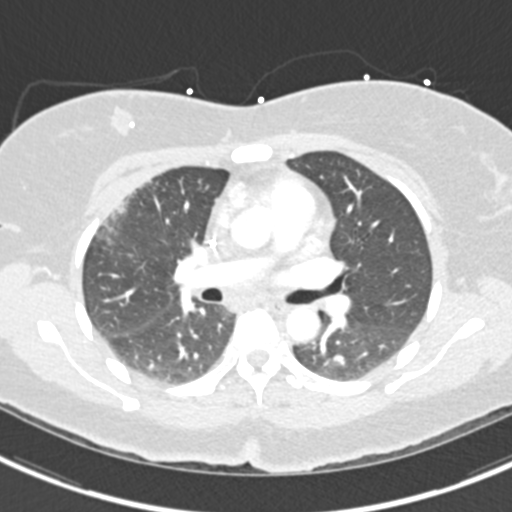
[im 166/255  soft-tissue]
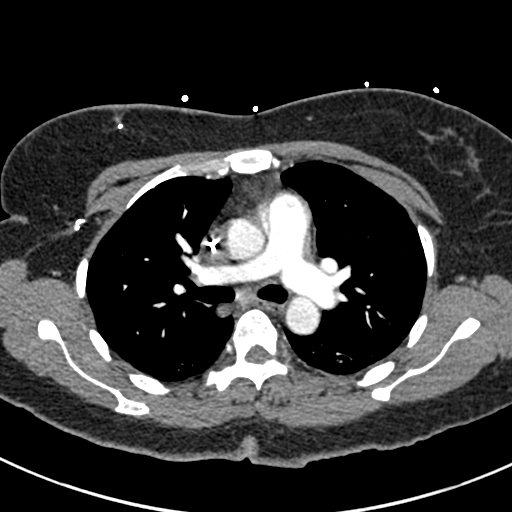
[im 188/255  lung]
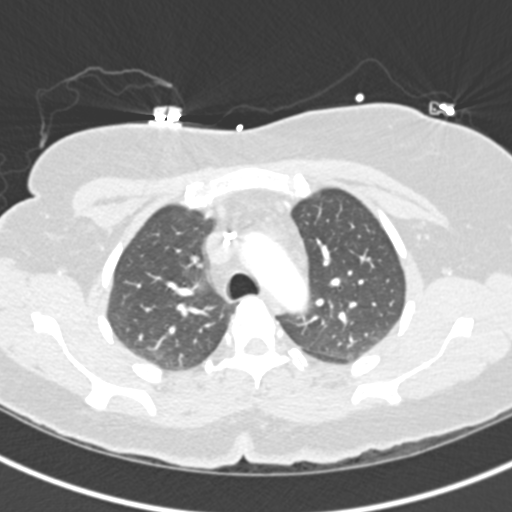
[im 210/255  soft-tissue]
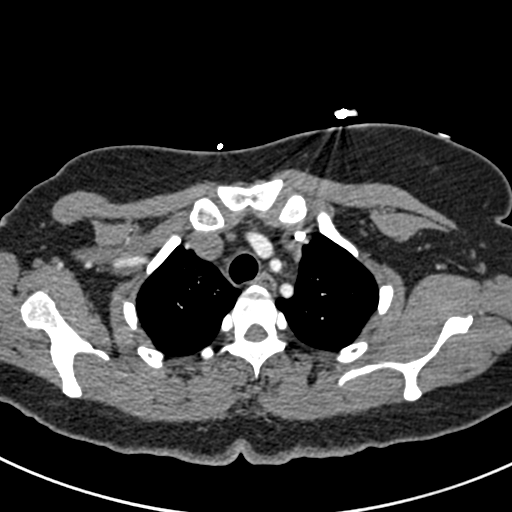
[im 221/255  lung]
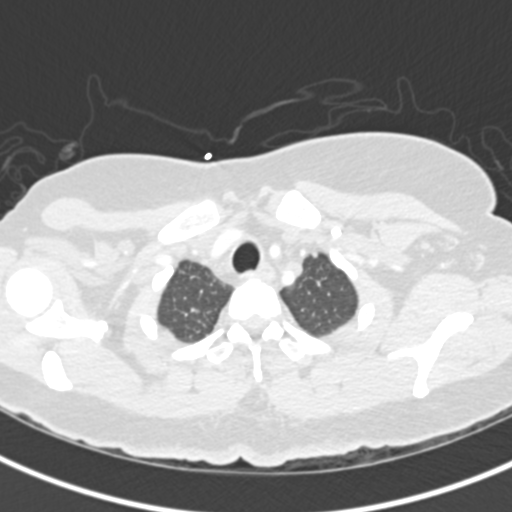
[im 243/255  soft-tissue]
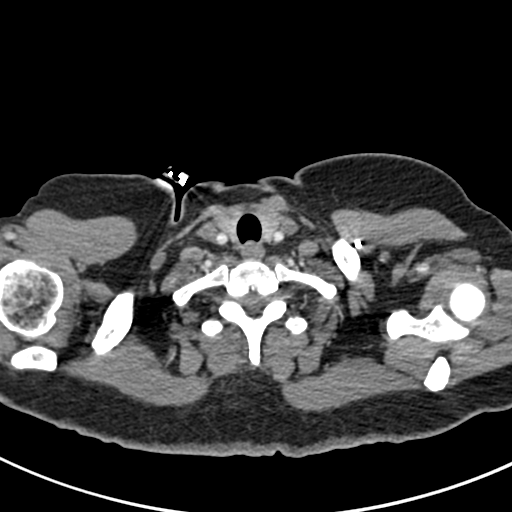

[Series 8: coronal mpr · coronal · 0.51mm/px · 3 of 128 slices shown]
[im 32/128  soft-tissue]
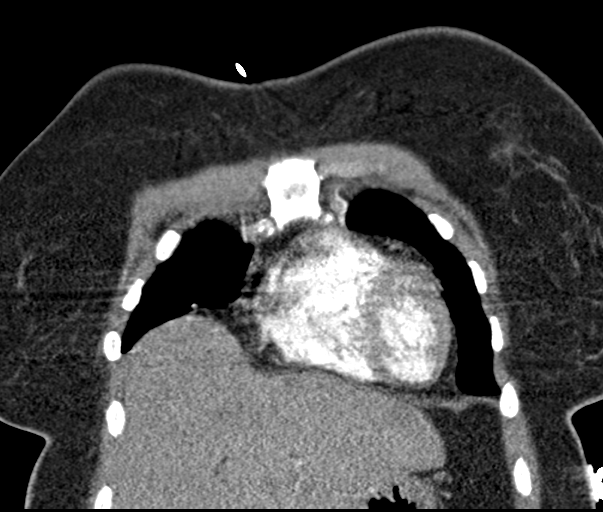
[im 64/128  soft-tissue]
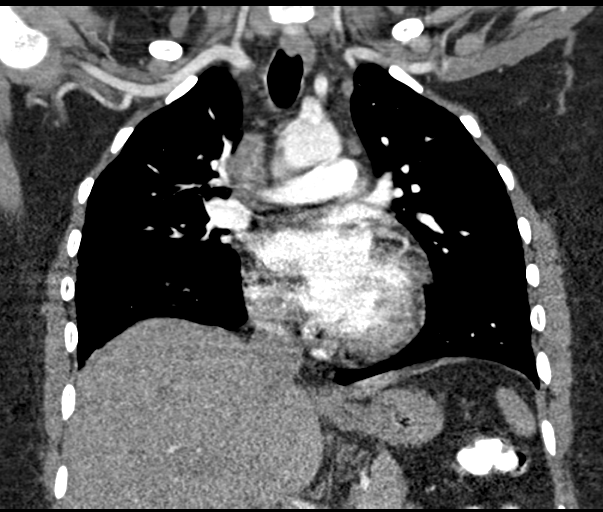
[im 96/128  soft-tissue]
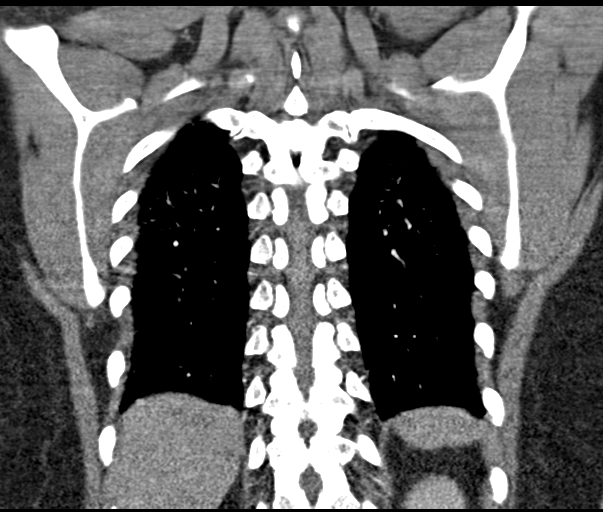

[17 of 46 positions shown; findings below may reference images not displayed]

FINDINGS: Cardiovascular: There are pulmonary emboli in several subsegmental
branches of the right lower lobe pulmonary artery. No more central
pulmonary embolus evident. No evidence of right heart strain.

No thoracic aortic aneurysm or dissection. The visualized great
vessels appear unremarkable. No pericardial effusion or pericardial
thickening is evident. Port-A-Cath tip is in the superior vena cava
near the cavoatrial junction.

Mediastinum/Nodes: Visualized thyroid appears normal. There are
scattered subcentimeter mediastinal lymph nodes. There is a
subcarinal lymph node measuring 1.7 x 1.1 cm. No other lymph node
enlargement noted. No change in mediastinal lymph nodes compared to
2 days prior. No esophageal lesions are evident.

Lungs/Pleura: There is subpleural consolidation in the periphery of
the right middle lobe, stable. There is a focal nodular lesion in
the medial segment right middle lobe measuring 7 x 7 mm, stable.
This nodular opacity is best seen on axial slice 57 series 7 and is
stable compared to 2 days prior. No new areas of airspace
consolidation are identified. No pleural effusion or pleural
thickening is evident. There is again noted a 2 mm nodular opacity
in the periphery of the posterior segment of the left upper lobe
near the major fissure.

Upper Abdomen: Widespread hepatic metastatic disease again noted.

Musculoskeletal: Widespread sclerotic bony metastases again noted.
Postoperative change noted in right breast, stable.

Review of the MIP images confirms the above findings.
IMPRESSION: 1. Small peripheral pulmonary emboli in the right lower lobe
pulmonary artery. No more central pulmonary emboli. No right heart
strain evident.

2.  Widespread hepatic and bony metastatic disease.

3. Stable consolidation periphery of right middle lobe, unchanged
from 2 days prior. Subcentimeter nodular opacities as well as mild
adenopathy unchanged from 2 days prior.

4.  Stable postoperative change right breast.

Critical Value/emergent results were called by telephone at the time
of interpretation on 09/04/2017 at [DATE] to Dr. TIGER
WOOD DECOR , who verbally acknowledged these results.

## 2019-05-06 IMAGING — CR DG CHEST 2V
3 series · 3 of 3 positions shown · non-contrast
Comparison: Chest CT 09/02/2017

CLINICAL DATA: Chest pain, weakness, fever

EXAM:
CHEST - 2 VIEW

[w chest lat (1 of 2)]
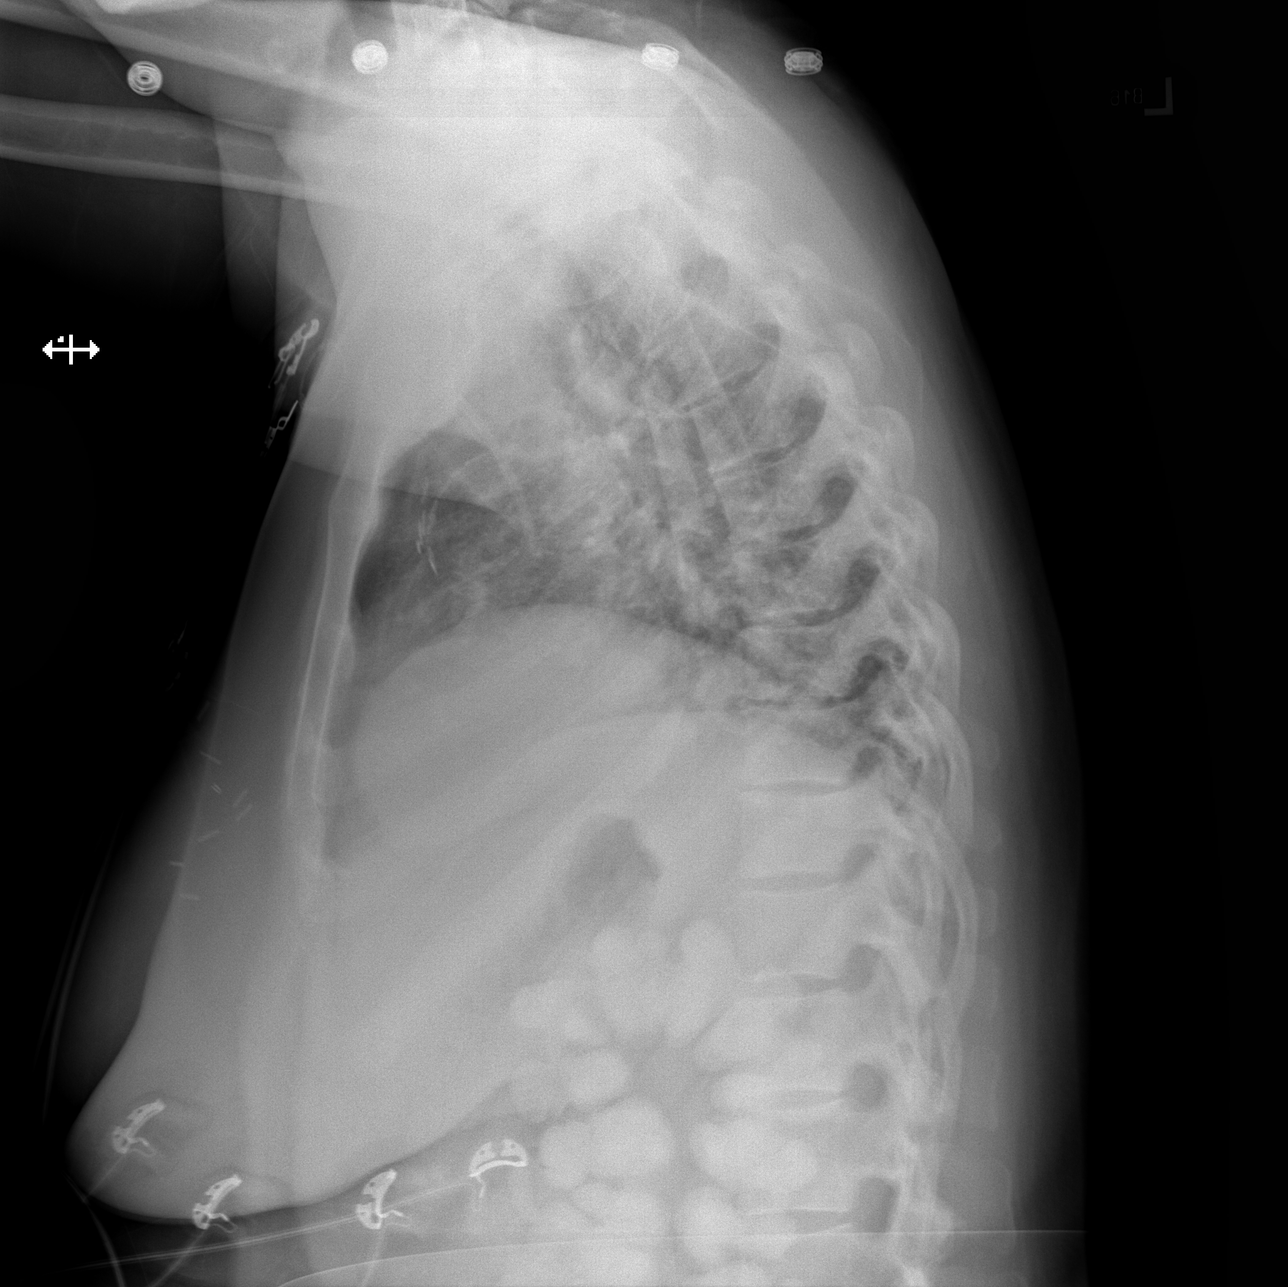

[w chest lat (2 of 2)]
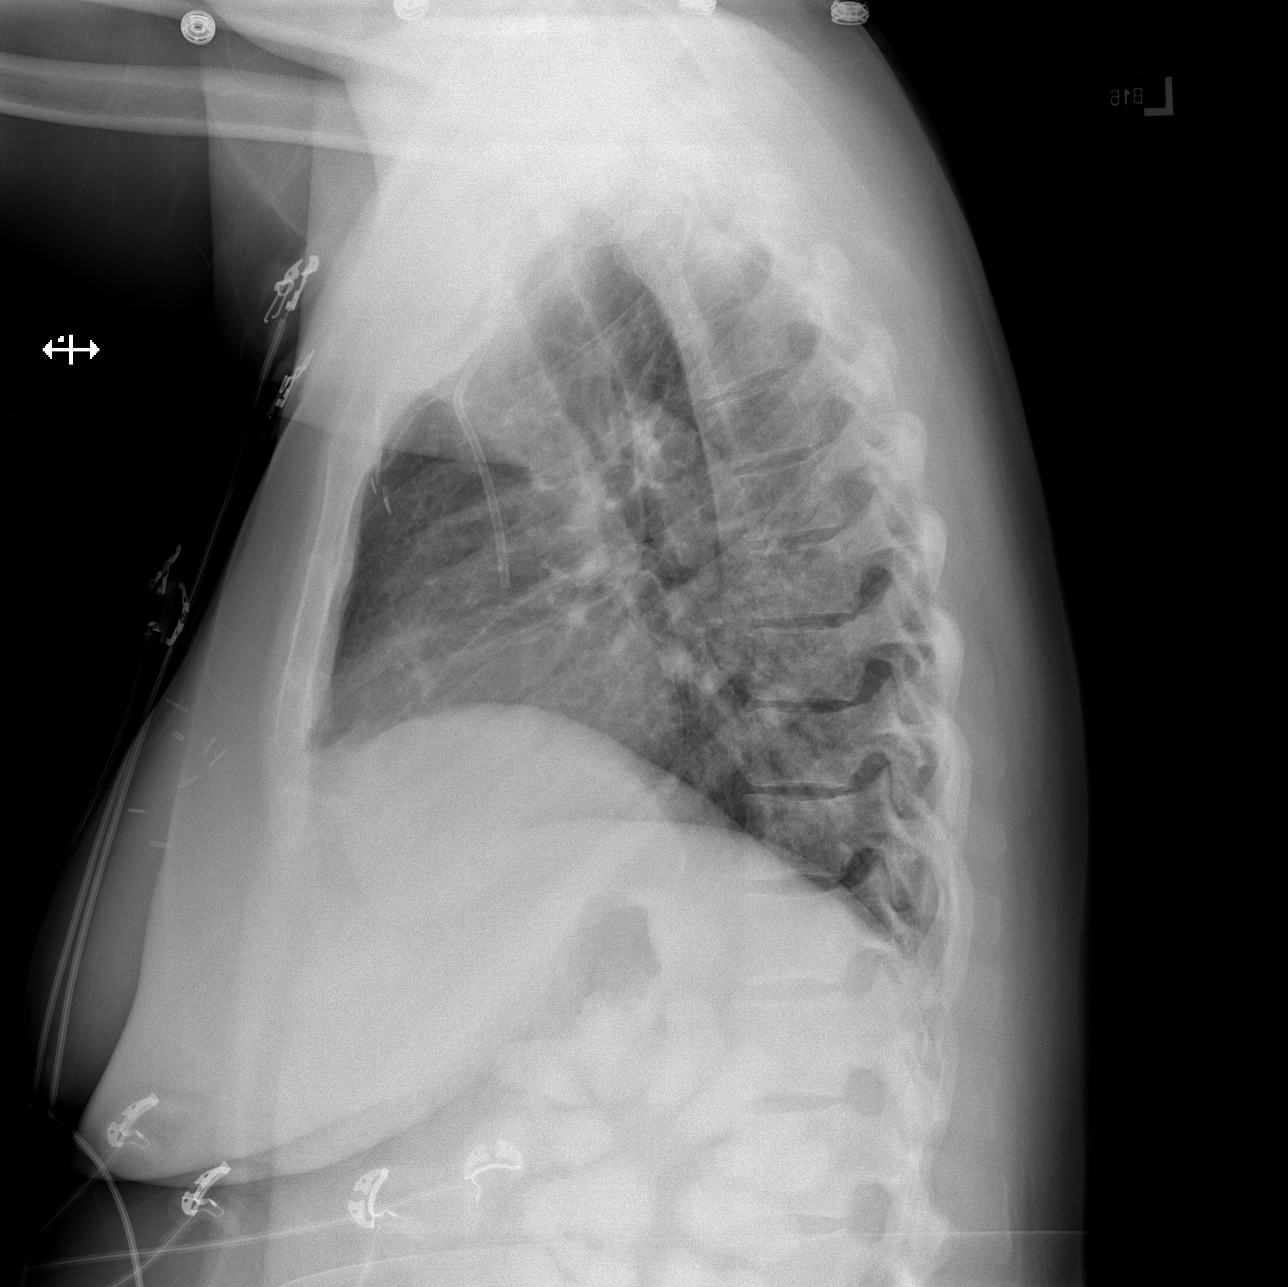

[x chest ap]
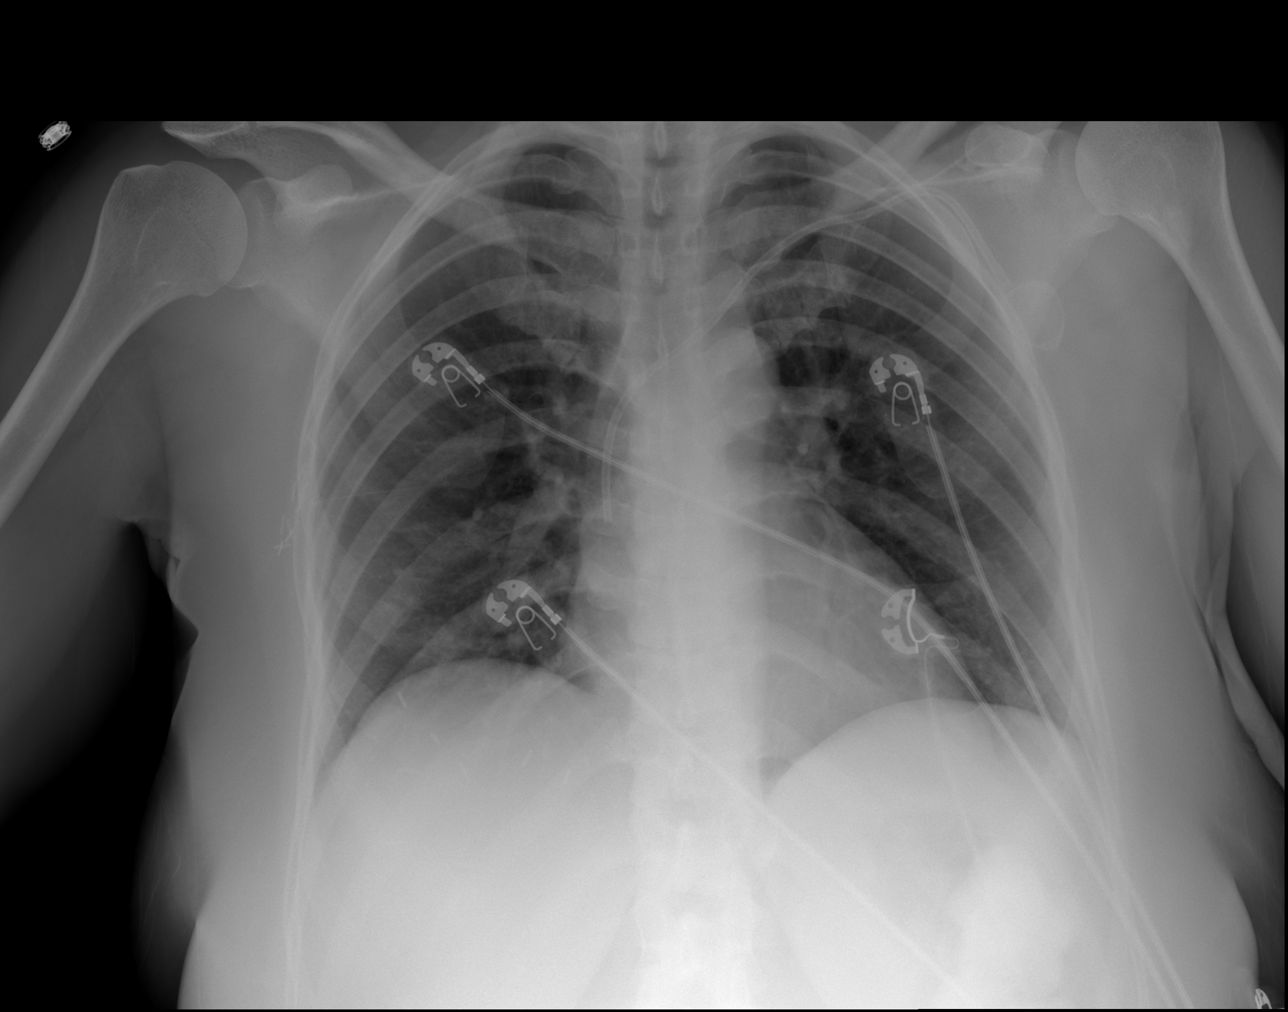

[3 of 3 positions shown; findings below may reference images not displayed]

FINDINGS: Left Port-A-Cath in place with the tip in the SVC. Heart is normal
size. No confluent airspace opacities or effusions. No acute bony
abnormality.
IMPRESSION: No active cardiopulmonary disease.

## 2019-05-06 IMAGING — MR MR HEAD WO/W CM
10 of 14 series · 34 of 48 positions shown · IV contrast (multihance)
Comparison: Chest CTA 1444 hours today. Nuclear medicine whole-body
bone scan 06/06/2017.

CLINICAL DATA: 42-year-old female with metastatic breast cancer.
New onset weakness. Small peripheral pulmonary emboli diagnosed on
CTA today.

EXAM:
MRI HEAD WITHOUT AND WITH CONTRAST
TECHNIQUE: Multiplanar, multiecho pulse sequences of the brain and surrounding
structures were obtained without and with intravenous contrast.
CONTRAST:  15mL MULTIHANCE GADOBENATE DIMEGLUMINE 529 MG/ML IV SOLN

[Series 3: DWI · axial · 3.0mm · 1.09mm/px · z∈[-1,+149]mm · 9 of 102 slices shown (1 of 4)]
[im 1/102]
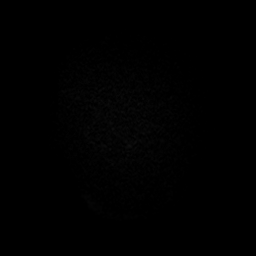
[im 13/102]
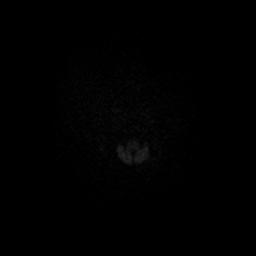
[im 26/102]
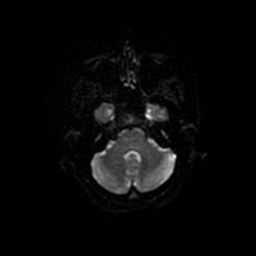
[im 38/102]
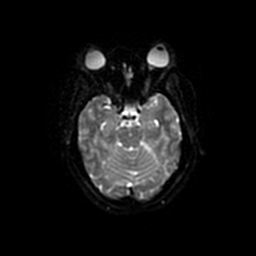
[im 51/102]
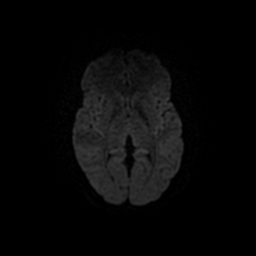
[im 64/102]
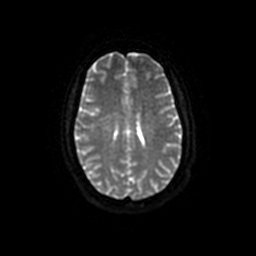
[im 76/102]
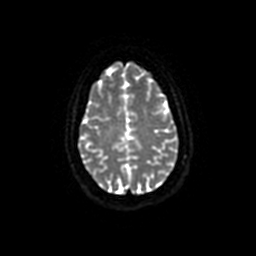
[im 89/102]
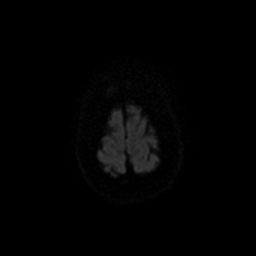
[im 102/102]
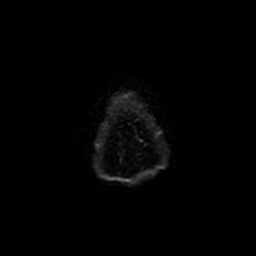

[Series 5: DWI · coronal · 5.0mm · 1.09mm/px · 6 of 72 slices shown (2 of 4)]
[im 1/72]
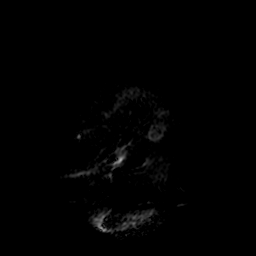
[im 15/72]
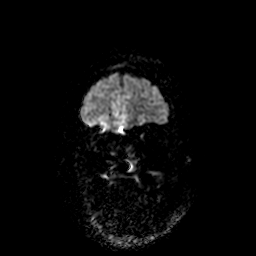
[im 29/72]
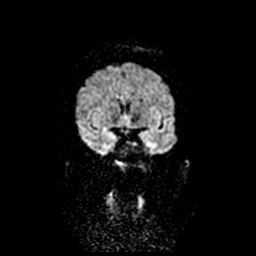
[im 43/72]
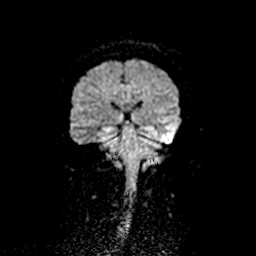
[im 57/72]
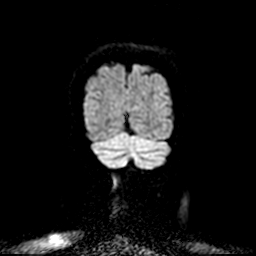
[im 72/72]
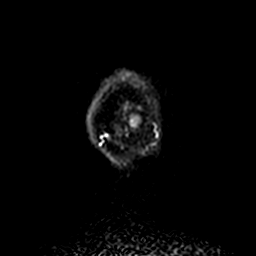

[Series 6: T2 · axial · 5.0mm · 0.43mm/px · z∈[-9,+145]mm · 2 of 23 slices shown]
[im 1/23]
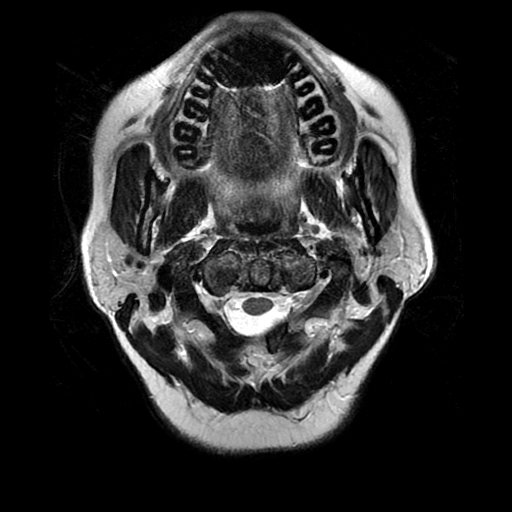
[im 23/23]
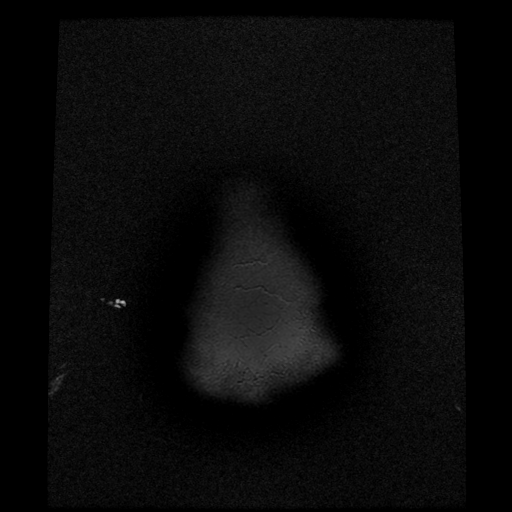

[Series 7: FLAIR · axial · 3.0mm · 0.43mm/px · z∈[-10,+146]mm · 2 of 27 slices shown (1 of 2)]
[im 1/27]
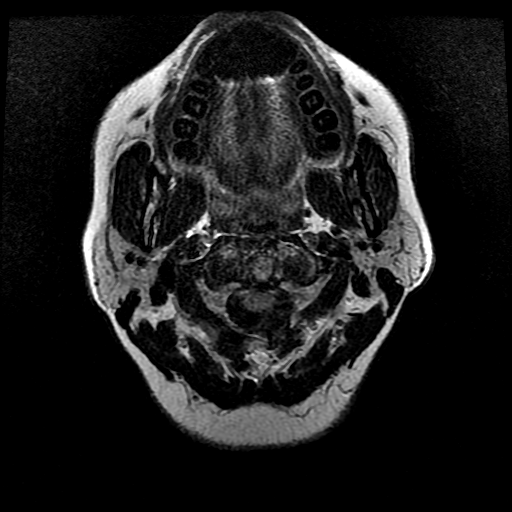
[im 27/27]
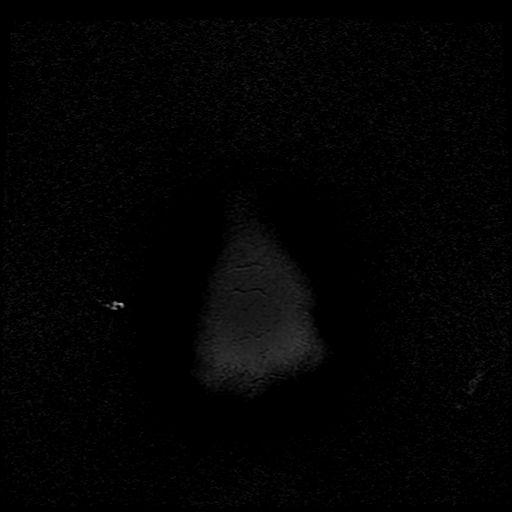

[Series 10: FLAIR · axial · 5.0mm · 0.43mm/px · z∈[-9,+140]mm · 2 of 26 slices shown (2 of 2)]
[im 1/26]
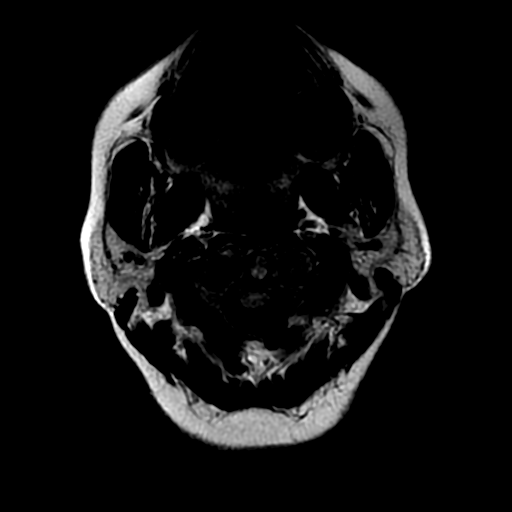
[im 26/26]
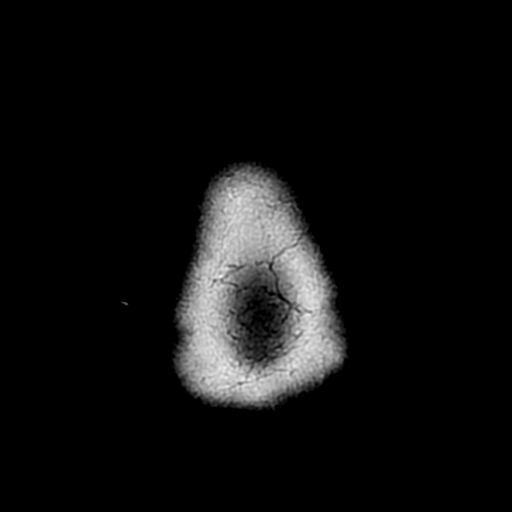

[Series 11: T2 post-contrast · coronal · 5.0mm · 0.45mm/px · 2 of 28 slices shown]
[im 1/28]
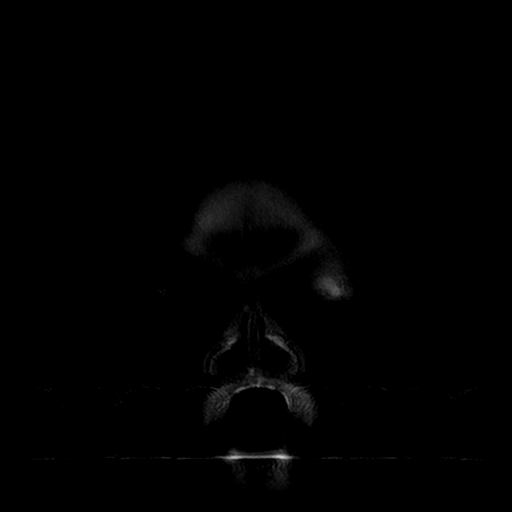
[im 28/28]
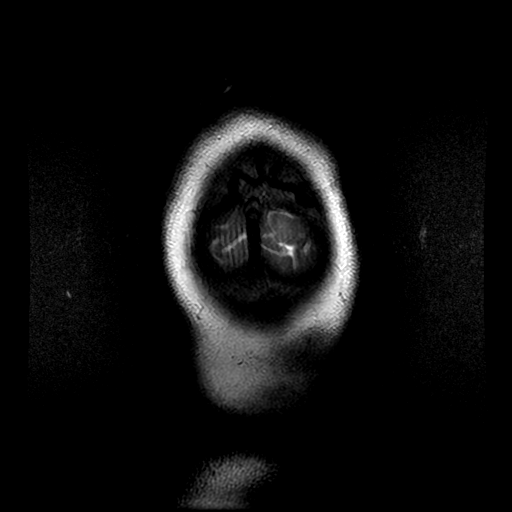

[Series 13: T1 post-contrast · coronal · 5.0mm · 0.45mm/px · 2 of 28 slices shown (1 of 2)]
[im 1/28]
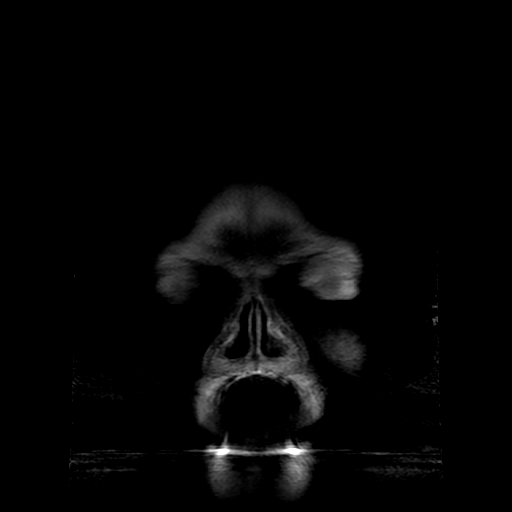
[im 28/28]
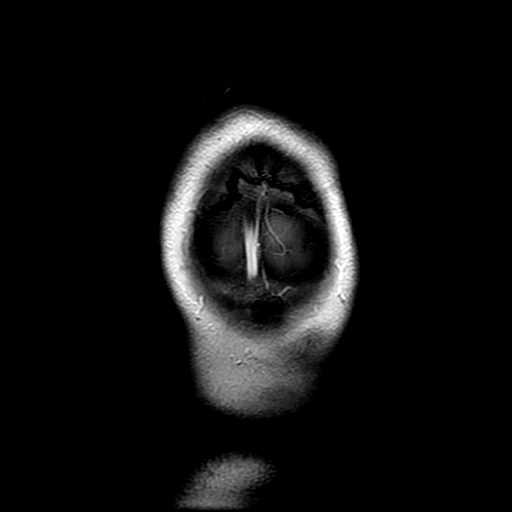

[Series 14: T1 post-contrast · sagittal · 5.0mm · 0.47mm/px · 2 of 24 slices shown (2 of 2)]
[im 1/24]
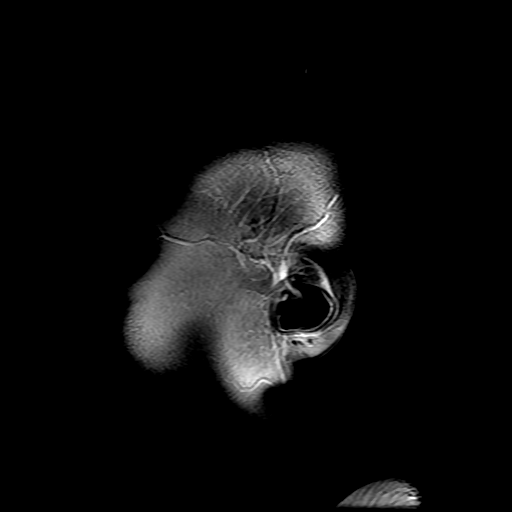
[im 24/24]
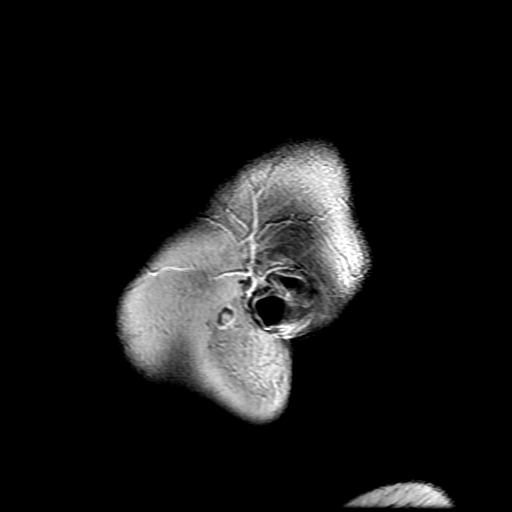

[Series 300: DWI · axial · 3.0mm · 1.09mm/px · z∈[-1,+149]mm · 4 of 51 slices shown (3 of 4)]
[im 1/51]
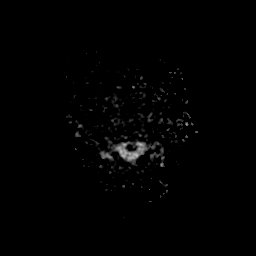
[im 17/51]
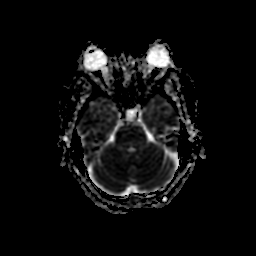
[im 34/51]
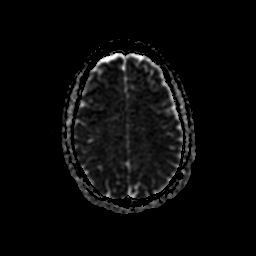
[im 51/51]
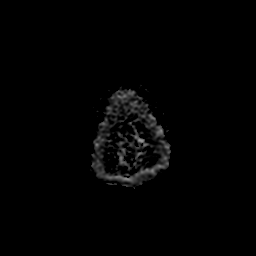

[Series 500: DWI · coronal · 5.0mm · 1.09mm/px · 3 of 36 slices shown (4 of 4)]
[im 1/36]
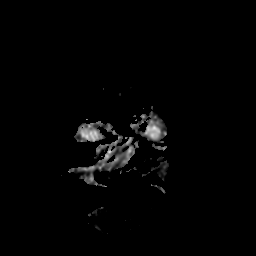
[im 18/36]
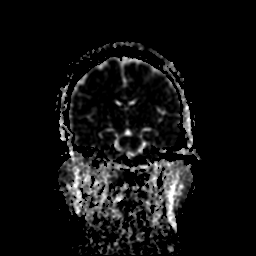
[im 36/36]
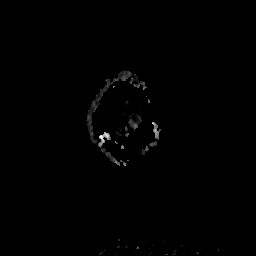

[34 of 48 positions shown; findings below may reference images not displayed]

FINDINGS: Brain: No abnormal enhancement identified. No midline shift, mass
effect, or evidence of intracranial mass lesion. No dural
thickening.

Vascular: No restricted diffusion to suggest acute infarction. No
ventriculomegaly, extra-axial collection or acute intracranial
hemorrhage. Cervicomedullary junction and pituitary are within
normal limits. Gray and white matter signal is within normal limits
throughout the brain. No chronic cerebral blood products or
encephalomalacia.

Skull and upper cervical spine: Sclerotic bone metastasis in the C3
vertebral body (series 4, image 12). Heterogeneous bone marrow
signal in the calvarium suspicious for scattered skull metastases
(including possibly the clivus), but no destructive skull lesion is
identified. Negative visible cervical spinal cord.

Sinuses/Orbits: Mildly Disconjugate gaze but otherwise normal orbit
soft tissues. Paranasal sinuses are clear.

Other: Mastoid air cells are clear. Visible internal auditory
structures appear normal. Scalp and face soft tissues appear
negative.
IMPRESSION: 1. No acute intracranial abnormality. No metastatic disease to the
brain identified.
2. C3 vertebral body bone metastasis. Bone marrow heterogeneity
elsewhere in the skull and cervical spine but no other overt bone
lesion identified.
